# Patient Record
Sex: Male | Born: 1960 | Race: White | Hispanic: No | Smoking: Never smoker
Health system: Southern US, Community
[De-identification: ages and names within clinical notes are randomized; demographics above are authoritative.]

## PROBLEM LIST (undated history)

## (undated) ENCOUNTER — Emergency Department (HOSPITAL_COMMUNITY): Admission: EM | Payer: Self-pay | Source: Home / Self Care

## (undated) DIAGNOSIS — H269 Unspecified cataract: Secondary | ICD-10-CM

## (undated) DIAGNOSIS — E1140 Type 2 diabetes mellitus with diabetic neuropathy, unspecified: Secondary | ICD-10-CM

## (undated) DIAGNOSIS — F419 Anxiety disorder, unspecified: Secondary | ICD-10-CM

## (undated) DIAGNOSIS — F32A Depression, unspecified: Secondary | ICD-10-CM

## (undated) DIAGNOSIS — M1990 Unspecified osteoarthritis, unspecified site: Secondary | ICD-10-CM

## (undated) DIAGNOSIS — G8929 Other chronic pain: Secondary | ICD-10-CM

## (undated) DIAGNOSIS — K219 Gastro-esophageal reflux disease without esophagitis: Secondary | ICD-10-CM

## (undated) DIAGNOSIS — F329 Major depressive disorder, single episode, unspecified: Secondary | ICD-10-CM

## (undated) DIAGNOSIS — F1910 Other psychoactive substance abuse, uncomplicated: Secondary | ICD-10-CM

## (undated) DIAGNOSIS — E785 Hyperlipidemia, unspecified: Secondary | ICD-10-CM

## (undated) DIAGNOSIS — E119 Type 2 diabetes mellitus without complications: Secondary | ICD-10-CM

## (undated) DIAGNOSIS — G709 Myoneural disorder, unspecified: Secondary | ICD-10-CM

## (undated) DIAGNOSIS — F319 Bipolar disorder, unspecified: Secondary | ICD-10-CM

## (undated) DIAGNOSIS — B029 Zoster without complications: Secondary | ICD-10-CM

## (undated) DIAGNOSIS — G4730 Sleep apnea, unspecified: Secondary | ICD-10-CM

## (undated) DIAGNOSIS — I10 Essential (primary) hypertension: Secondary | ICD-10-CM

## (undated) DIAGNOSIS — E7800 Pure hypercholesterolemia, unspecified: Secondary | ICD-10-CM

## (undated) DIAGNOSIS — Z8679 Personal history of other diseases of the circulatory system: Secondary | ICD-10-CM

## (undated) DIAGNOSIS — Z5189 Encounter for other specified aftercare: Secondary | ICD-10-CM

## (undated) HISTORY — PX: COLONOSCOPY: SHX174

## (undated) HISTORY — DX: Bipolar disorder, unspecified: F31.9

## (undated) HISTORY — DX: Unspecified cataract: H26.9

## (undated) HISTORY — DX: Anxiety disorder, unspecified: F41.9

## (undated) HISTORY — DX: Gastro-esophageal reflux disease without esophagitis: K21.9

## (undated) HISTORY — DX: Depression, unspecified: F32.A

## (undated) HISTORY — DX: Unspecified osteoarthritis, unspecified site: M19.90

## (undated) HISTORY — DX: Encounter for other specified aftercare: Z51.89

## (undated) HISTORY — DX: Essential (primary) hypertension: I10

## (undated) HISTORY — DX: Zoster without complications: B02.9

## (undated) HISTORY — DX: Myoneural disorder, unspecified: G70.9

## (undated) HISTORY — DX: Personal history of other diseases of the circulatory system: Z86.79

## (undated) HISTORY — PX: TRANSTHORACIC ECHOCARDIOGRAM: SHX275

## (undated) HISTORY — DX: Major depressive disorder, single episode, unspecified: F32.9

## (undated) HISTORY — PX: POLYPECTOMY: SHX149

## (undated) HISTORY — DX: Other psychoactive substance abuse, uncomplicated: F19.10

## (undated) HISTORY — DX: Type 2 diabetes mellitus without complications: E11.9

## (undated) HISTORY — DX: Hyperlipidemia, unspecified: E78.5

---

## 1998-02-25 ENCOUNTER — Emergency Department (HOSPITAL_COMMUNITY): Admission: EM | Admit: 1998-02-25 | Discharge: 1998-02-25 | Payer: Self-pay | Admitting: Emergency Medicine

## 2003-11-03 ENCOUNTER — Emergency Department (HOSPITAL_COMMUNITY): Admission: EM | Admit: 2003-11-03 | Discharge: 2003-11-03 | Payer: Self-pay | Admitting: Emergency Medicine

## 2007-01-14 HISTORY — PX: SHOULDER ARTHROSCOPY WITH ROTATOR CUFF REPAIR: SHX5685

## 2007-08-05 ENCOUNTER — Encounter: Admission: RE | Admit: 2007-08-05 | Discharge: 2007-08-05 | Payer: Self-pay | Admitting: Orthopedic Surgery

## 2007-10-02 ENCOUNTER — Emergency Department (HOSPITAL_COMMUNITY): Admission: EM | Admit: 2007-10-02 | Discharge: 2007-10-03 | Payer: Self-pay | Admitting: Emergency Medicine

## 2008-01-14 HISTORY — PX: SPINE SURGERY: SHX786

## 2008-02-03 ENCOUNTER — Ambulatory Visit (HOSPITAL_COMMUNITY): Admission: RE | Admit: 2008-02-03 | Discharge: 2008-02-04 | Payer: Self-pay | Admitting: Orthopedic Surgery

## 2008-03-13 ENCOUNTER — Ambulatory Visit: Payer: Self-pay | Admitting: Gastroenterology

## 2008-04-11 ENCOUNTER — Encounter: Payer: Self-pay | Admitting: Internal Medicine

## 2008-05-11 ENCOUNTER — Ambulatory Visit: Payer: Self-pay | Admitting: Internal Medicine

## 2008-05-11 DIAGNOSIS — F329 Major depressive disorder, single episode, unspecified: Secondary | ICD-10-CM | POA: Insufficient documentation

## 2008-05-11 DIAGNOSIS — F32A Depression, unspecified: Secondary | ICD-10-CM | POA: Insufficient documentation

## 2008-05-11 DIAGNOSIS — E782 Mixed hyperlipidemia: Secondary | ICD-10-CM | POA: Insufficient documentation

## 2008-05-11 DIAGNOSIS — Z860100 Personal history of colon polyps, unspecified: Secondary | ICD-10-CM | POA: Insufficient documentation

## 2008-05-11 DIAGNOSIS — M545 Low back pain, unspecified: Secondary | ICD-10-CM | POA: Insufficient documentation

## 2008-05-11 DIAGNOSIS — E785 Hyperlipidemia, unspecified: Secondary | ICD-10-CM

## 2008-05-11 DIAGNOSIS — M1990 Unspecified osteoarthritis, unspecified site: Secondary | ICD-10-CM | POA: Insufficient documentation

## 2008-05-11 DIAGNOSIS — I10 Essential (primary) hypertension: Secondary | ICD-10-CM | POA: Insufficient documentation

## 2008-05-11 DIAGNOSIS — K219 Gastro-esophageal reflux disease without esophagitis: Secondary | ICD-10-CM | POA: Insufficient documentation

## 2008-05-11 LAB — CONVERTED CEMR LAB

## 2008-05-22 ENCOUNTER — Ambulatory Visit (HOSPITAL_COMMUNITY): Admission: RE | Admit: 2008-05-22 | Discharge: 2008-05-23 | Payer: Self-pay | Admitting: Orthopedic Surgery

## 2008-08-02 ENCOUNTER — Ambulatory Visit: Payer: Self-pay | Admitting: Internal Medicine

## 2008-08-16 ENCOUNTER — Ambulatory Visit: Payer: Self-pay | Admitting: Gastroenterology

## 2008-08-30 ENCOUNTER — Ambulatory Visit: Payer: Self-pay | Admitting: Gastroenterology

## 2008-12-05 ENCOUNTER — Ambulatory Visit: Payer: Self-pay | Admitting: Internal Medicine

## 2008-12-05 DIAGNOSIS — B029 Zoster without complications: Secondary | ICD-10-CM | POA: Insufficient documentation

## 2008-12-05 DIAGNOSIS — J019 Acute sinusitis, unspecified: Secondary | ICD-10-CM | POA: Insufficient documentation

## 2008-12-05 HISTORY — DX: Zoster without complications: B02.9

## 2010-01-24 ENCOUNTER — Ambulatory Visit: Admit: 2010-01-24 | Payer: Self-pay | Admitting: Internal Medicine

## 2010-02-14 NOTE — Assessment & Plan Note (Signed)
Summary: COLD/NWS   Vital Signs:  Patient profile:   50 year old male Height:      74 inches Weight:      246 pounds BMI:     31.70 O2 Sat:      99 % on Room air Temp:     97.7 degrees F oral Pulse rate:   85 / minute Pulse rhythm:   regular Resp:     16 per minute BP sitting:   134 / 86  (left arm) Cuff size:   large  Vitals Entered By: Rock Nephew CMA (December 05, 2008 8:15 AM)  Nutrition Counseling: Patient's BMI is greater than 25 and therefore counseled on weight management options.  O2 Flow:  Room air CC: cough, bodyache, congestion, URI symptoms Is Patient Diabetic? No   Primary Care Provider:  Etta Grandchild MD  CC:  cough, bodyache, congestion, and URI symptoms.  History of Present Illness:  URI Symptoms      This is a 50 year old man who presents with URI symptoms.  The symptoms began 5 days ago.  The severity is described as moderate.  The patient reports nasal congestion, purulent nasal discharge, sore throat, and productive cough, but denies earache and sick contacts.  The patient denies fever, stiff neck, dyspnea, wheezing, rash, vomiting, diarrhea, and use of an antipyretic.  The patient also reports headache, muscle aches, and severe fatigue.  Risk factors for Strep sinusitis include unilateral facial pain and unilateral nasal discharge.  The patient denies the following risk factors for Strep sinusitis: tender adenopathy.    Also, he woke up today with a painful rash in his right calf with some tingling.  Preventive Screening-Counseling & Management  Alcohol-Tobacco     Alcohol drinks/day: 0     Smoking Status: never  Current Medications (verified): 1)  Trazodone Hcl 50 Mg Tabs (Trazodone Hcl) .... Take 1 Tablet By Mouth Once A Day 2)  Gabapentin 300 Mg Caps (Gabapentin) .... One Tab Every 8 Hrs 3)  Benicar Hct 20-12.5 Mg Tabs (Olmesartan Medoxomil-Hctz) .... Once Daily 4)  Nexium 40 Mg Cpdr (Esomeprazole Magnesium) .... Once Daily 5)   Oxycodone-Acetaminophen 10-325 Mg Tabs (Oxycodone-Acetaminophen) .... One Every 6 Hours 6)  Lamictal 25 Mg Tabs (Lamotrigine) .... 25mg -50mg   Allergies (verified): 1)  ! Sulfa 2)  ! * Mycins 3)  ! Terramycin  Past History:  Past Medical History: Reviewed history from 05/11/2008 and no changes required. Colonic polyps, hx of Depression GERD Hyperlipidemia Hypertension Low back pain Osteoarthritis Normal cardiac cath 10 years ago  Past Surgical History: Reviewed history from 05/11/2008 and no changes required. Lumbar laminectomy in 2010  Family History: Reviewed history from 05/11/2008 and no changes required. Family History of Arthritis Family History of CAD Male 1st degree relative <50 Family History Diabetes 1st degree relative Family History Hypertension Family History of Sudden Death  Social History: Reviewed history from 05/11/2008 and no changes required. Occupation: Disabled trucker Divorced Never Smoked Alcohol use-no Drug use-no Regular exercise-yes  Review of Systems       The patient complains of suspicious skin lesions.  The patient denies anorexia, fever, abdominal pain, enlarged lymph nodes, and angioedema.    Physical Exam  General:  alert, well-developed, well-nourished, well-hydrated, appropriate dress, normal appearance, healthy-appearing, cooperative to examination, good hygiene, and overweight-appearing.   Head:  normocephalic, atraumatic, no abnormalities observed, and no abnormalities palpated.   Eyes:  vision grossly intact, pupils equal, pupils round, and pupils reactive to light.  Ears:  R ear normal and L ear normal.   Mouth:  no exudates, no posterior lymphoid hypertrophy, no postnasal drip, no pharyngeal crowing, no lesions, no aphthous ulcers, no erosions, no petechiae, and pharyngeal erythema.   Neck:  supple, full ROM, no masses, no thyromegaly, no thyroid nodules or tenderness, no JVD, no cervical lymphadenopathy, and no neck  tenderness.   Lungs:  normal respiratory effort, no intercostal retractions, no accessory muscle use, normal breath sounds, and no dullness.   Heart:  normal rate, regular rhythm, no murmur, no gallop, and no rub.   Abdomen:  soft, non-tender, normal bowel sounds, no distention, no masses, no hepatomegaly, and no splenomegaly.   Msk:  normal ROM, no joint tenderness, no joint swelling, and no joint warmth.   Pulses:  R and L carotid,radial,femoral,dorsalis pedis and posterior tibial pulses are full and equal bilaterally Extremities:  No clubbing, cyanosis, edema, or deformity noted with normal full range of motion of all joints.   Neurologic:  No cranial nerve deficits noted. Station and gait are normal. Plantar reflexes are down-going bilaterally. DTRs are symmetrical throughout. Sensory, motor and coordinative functions appear intact. Skin:  he has a group of coaleseced erythematous papules on his right posterior calf. there is no induration, fluctuance, streaking, pustules, or wounds Cervical Nodes:  no anterior cervical adenopathy and no posterior cervical adenopathy.   Axillary Nodes:  no R axillary adenopathy and no L axillary adenopathy.   Inguinal Nodes:  no R inguinal adenopathy and no L inguinal adenopathy.   Psych:  Oriented X3, memory intact for recent and remote, good eye contact, not depressed appearing, not agitated, and slightly anxious.     Impression & Recommendations:  Problem # 1:  HERPES ZOSTER (ICD-053.9) Assessment New start Acyclovir  Problem # 2:  SINUSITIS- ACUTE-NOS (ICD-461.9) Assessment: New  His updated medication list for this problem includes:    Amoxicillin 500 Mg Cap (Amoxicillin) .Marland Kitchen... Take 1 capsule by mouth three times a day x 10 days    Promethazine-dm 6.25-15 Mg/43ml Syrp (Promethazine-dm) .Marland Kitchen... 5-10 ml by mouth qid as needed for cough  Instructed on treatment. Call if symptoms persist or worsen.   Complete Medication List: 1)  Trazodone Hcl 50 Mg  Tabs (Trazodone hcl) .... Take 1 tablet by mouth once a day 2)  Gabapentin 300 Mg Caps (Gabapentin) .... One tab every 8 hrs 3)  Benicar Hct 20-12.5 Mg Tabs (Olmesartan medoxomil-hctz) .... Once daily 4)  Nexium 40 Mg Cpdr (Esomeprazole magnesium) .... Once daily 5)  Oxycodone-acetaminophen 10-325 Mg Tabs (Oxycodone-acetaminophen) .... One every 6 hours 6)  Lamictal 25 Mg Tabs (Lamotrigine) .... 25mg -50mg  7)  Amoxicillin 500 Mg Cap (Amoxicillin) .... Take 1 capsule by mouth three times a day x 10 days 8)  Acyclovir 800 Mg Tabs (Acyclovir) .... One by mouth three times a day for 7 days 9)  Promethazine-dm 6.25-15 Mg/2ml Syrp (Promethazine-dm) .... 5-10 ml by mouth qid as needed for cough  Patient Instructions: 1)  It is important that you exercise regularly at least 20 minutes 5 times a week. If you develop chest pain, have severe difficulty breathing, or feel very tired , stop exercising immediately and seek medical attention. 2)  You need to lose weight. Consider a lower calorie diet and regular exercise.  3)  Take your antibiotic as prescribed until ALL of it is gone, but stop if you develop a rash or swelling and contact our office as soon as possible. 4)  Acute bronchitis symptoms  for less than 10 days are not helped by antibiotics. take over the counter cough medications. call if no improvment in  5-7 days, sooner if increasing cough, fever, or new symptoms( shortness of breath, chest pain). Prescriptions: AMOXICILLIN 500 MG CAP (AMOXICILLIN) Take 1 capsule by mouth three times a day X 10 days  #30 x 0   Entered and Authorized by:   Etta Grandchild MD   Signed by:   Etta Grandchild MD on 12/05/2008   Method used:   [facility] to        [id number]       [address]       [place], [place]  [address]       Ph: [phone] or [phone]       Fax: [phone]   RxID:   [id number] PROMETHAZINE-DM 6.25-15 MG/5ML SYRP (PROMETHAZINE-DM)  5-10 ml by mouth QID as needed for cough  #6 ounces x 0   Entered and Authorized by:   Etta Grandchild MD   Signed by:   Etta Grandchild MD on 12/05/2008   Method used:   [facility] to        [id number]       [address]       [place], [place]  [address]       Ph: [phone] or [phone]       Fax: [phone]   RxID:   [id number] ACYCLOVIR 800 MG TABS (ACYCLOVIR) One by mouth three times a day for 7 days  #21 x 1   Entered and Authorized by:   Etta Grandchild MD   Signed by:   Etta Grandchild MD on 12/05/2008   Method used:   Historical   RxID:   [id number] AMOXICILLIN 500 MG CAP (AMOXICILLIN) Take 1 capsule by mouth three times a day X 10 days  #30 x 0   Entered and Authorized by:   Etta Grandchild MD   Signed by:   Etta Grandchild MD on 12/05/2008   Method used:   Print then Give to Patient   RxID:   [id number]

## 2010-02-14 NOTE — Miscellaneous (Signed)
Summary: [facility] Previsit/prep  Clinical Lists Changes  Medications: Added new medication of DULCOLAX 5 MG  TBEC (BISACODYL) Day before procedure take 2 at 3pm and 2 at 8pm. - Signed Added new medication of METOCLOPRAMIDE HCL 10 MG  TABS (METOCLOPRAMIDE HCL) As per prep instructions. - Signed Added new medication of MIRALAX   POWD (POLYETHYLENE GLYCOL 3350) As per prep  instructions. - Signed Rx of DULCOLAX 5 MG  TBEC (BISACODYL) Day before procedure take 2 at 3pm and 2 at 8pm.;  #4 x 0;  Signed;  Entered by: Wyona Almas RN;  Authorized by: Louis Meckel MD;  Method used: [id number] [address] [place], [place]  [address], Ph: [phone] or [phone], Fax: [phone] Rx of METOCLOPRAMIDE HCL 10 MG  TABS (METOCLOPRAMIDE HCL) As per prep instructions.;  #2 x 0;  Signed;  Entered by: Wyona Almas RN;  Authorized by: Louis Meckel MD;  Method used: [id number] [address] [place], [place]  [address], Ph: [phone] or [phone], Fax: [phone] Rx of MIRALAX   POWD (POLYETHYLENE GLYCOL 3350) As per prep  instructions.;  #255gm x 0;  Signed;  Entered by: Wyona Almas RN;  Authorized by: Louis Meckel MD;  Method used: [facility]ically [id number] [address], [place], [place]  [address], Ph: [phone] or [phone], Fax: [phone] Allergies: Added new allergy or adverse reaction of SULFA Added new allergy or adverse reaction of * MYCINS Observations: Added new observation of NKA: F (03/13/2008 8:21)    Prescriptions: MIRALAX   POWD (POLYETHYLENE GLYCOL 3350) As per prep  instructions.  #255gm x 0   Entered by:   Wyona Almas RN   Authorized by:   Louis Meckel MD   Signed by:   Wyona Almas RN on 03/13/2008   Method used:   [facility]ronically to        [id number]       [address]       [place], [place]  [address]       Ph: [phone] or [phone]       Fax: [phone]   RxID:   [id number] METOCLOPRAMIDE HCL 10 MG  TABS (METOCLOPRAMIDE HCL) As per prep instructions.  #2 x 0   Entered by:   Wyona Almas RN   Authorized by:   Louis Meckel MD   Signed by:   Wyona Almas RN on 03/13/2008   Method used:   [facility]ically to        [id number]       [address]       [place], [place]  [address]       Ph: [phone] or [phone]       Fax: [phone]   RxID:   [id number] DULCOLAX 5 MG  TBEC (BISACODYL) Day before procedure take 2 at 3pm and 2 at 8pm.  #4 x 0   Entered by:   Wyona Almas RN   Authorized by:   Louis Meckel MD   Signed by:   Wyona Almas RN on 03/13/2008   Method used:   [facility]ically to        [id number]       [address]  [place], [place]  [address]       Ph: [phone] or [phone]       Fax: [phone]   RxID:   [id number]

## 2010-02-14 NOTE — Miscellaneous (Signed)
Summary: Miralax prep - pt has medications from previous previsit  Clinical Lists Changes

## 2010-02-14 NOTE — Assessment & Plan Note (Signed)
Summary: new to establish/united hc/$50/cd   Vital Signs:  Patient profile:   50 year old male Height:      74 inches Weight:      246 pounds BMI:     31.70 O2 Sat:      97 % Temp:     97.9 degrees F oral Pulse rate:   86 / minute Pulse rhythm:   regular BP sitting:   140 / 90  (right arm) Cuff size:   large  Vitals Entered By: Rock Nephew CMA (May 11, 2008 2:49 PM)  Nutrition Counseling: Patient's BMI is greater than 25 and therefore counseled on weight management options.  Primary Care Provider:  Etta Grandchild MD   History of Present Illness: This is a new pt. to me who needs a new PCP. He has no complaints today.  Dyspepsia History:      He has no alarm features of dyspepsia including no history of melena, hematochezia, dysphagia, persistent vomiting, or involuntary weight loss > 5%.  There is a prior history of GERD.  He notes that it has been less than 12 months since the last episode of GERD.  The patient does not have a prior history of documented ulcer disease.  The dominant symptom is heartburn or acid reflux.  An H-2 blocker medication is currently being taken.  He notes that the symptoms have improved with the H-2 blocker therapy.  Symptoms have not persisted after 4 weeks of H-2 blocker treatment.  He has no history of a positive H. Pylori serology.  No previous upper endoscopy has been done.     Preventive Screening-Counseling & Management     Alcohol drinks/day: 0     Smoking Status: never     Does Patient Exercise: yes     Hepatitis Risk: no risk noted     HIV Risk: no risk noted     STD Risk: no risk noted      Drug Use:  no.        Blood Transfusions:  no.    Current Medications (verified): 1)  Trazodone Hcl 50 Mg Tabs (Trazodone Hcl) .... Take 1 Tablet By Mouth Once A Day 2)  Gabapentin 300 Mg Caps (Gabapentin) .... One Tab Every 8 Hrs 3)  Ultram Er 300 Mg Xr24h-Tab (Tramadol Hcl) .... Take 1 Tablet By Mouth Once A Day 4)  Benicar Hct 20-12.5 Mg  Tabs (Olmesartan Medoxomil-Hctz) .... Once Daily 5)  Nexium 40 Mg Cpdr (Esomeprazole Magnesium) .... Once Daily  Allergies: 1)  ! Sulfa 2)  ! * Mycins 3)  ! Terramycin  Past History:  Past Medical History:    Colonic polyps, hx of    Depression    GERD    Hyperlipidemia    Hypertension    Low back pain    Osteoarthritis    Normal cardiac cath 10 years ago  Past Surgical History:    Lumbar laminectomy in 2010  Family History:    Family History of Arthritis    Family History of CAD Male 1st degree relative <50    Family History Diabetes 1st degree relative    Family History Hypertension    Family History of Sudden Death  Social History:    Occupation: Disabled trucker    Divorced    Never Smoked    Alcohol use-no    Drug use-no    Regular exercise-yes    Smoking Status:  never    Hepatitis Risk:  no risk noted  HIV Risk:  no risk noted    STD Risk:  no risk noted    Blood Transfusions:  no    Drug Use:  no    Does Patient Exercise:  yes  Review of Systems       The patient complains of weight gain.  The patient denies anorexia, fever, weight loss, chest pain, syncope, dyspnea on exertion, peripheral edema, prolonged cough, headaches, hemoptysis, abdominal pain, melena, hematochezia, severe indigestion/heartburn, hematuria, incontinence, difficulty walking, depression, enlarged lymph nodes, angioedema, and testicular masses.    Physical Exam  General:  alert, well-developed, well-nourished, well-hydrated, appropriate dress, and overweight-appearing.   Eyes:  No corneal or conjunctival inflammation noted. EOMI. Perrla. Funduscopic exam benign, without hemorrhages, exudates or papilledema. Vision grossly normal. Mouth:  Oral mucosa and oropharynx without lesions or exudates.  Teeth in good repair. Neck:  supple, full ROM, and no masses.   Lungs:  Normal respiratory effort, chest expands symmetrically. Lungs are clear to auscultation, no crackles or wheezes. Heart:   Normal rate and regular rhythm. S1 and S2 normal without gallop, murmur, click, rub or other extra sounds. Abdomen:  soft, non-tender, normal bowel sounds, no distention, no masses, no guarding, no hepatomegaly, and no splenomegaly.   Msk:  normal ROM, no joint tenderness, no joint swelling, no joint warmth, no redness over joints, and no crepitation.   Pulses:  R and L carotid,radial,femoral,dorsalis pedis and posterior tibial pulses are full and equal bilaterally Extremities:  No clubbing, cyanosis, edema, or deformity noted with normal full range of motion of all joints.   Neurologic:  No cranial nerve deficits noted. Station and gait are normal. Plantar reflexes are down-going bilaterally. DTRs are symmetrical throughout. Sensory, motor and coordinative functions appear intact. Skin:  turgor normal, color normal, no rashes, no suspicious lesions, no ecchymoses, no petechiae, no purpura, no ulcerations, and no edema.   Cervical Nodes:  No lymphadenopathy noted Psych:  Cognition and judgment appear intact. Alert and cooperative with normal attention span and concentration. No apparent delusions, illusions, hallucinations   Impression & Recommendations:  Problem # 1:  HYPERTENSION (ICD-401.9) Assessment Unchanged  His updated medication list for this problem includes:    Benicar Hct 20-12.5 Mg Tabs (Olmesartan medoxomil-hctz) ..... Once daily  Problem # 2:  HYPERLIPIDEMIA (ICD-272.4) Assessment: Unchanged  Problem # 3:  GERD (ICD-530.81) Assessment: Unchanged  His updated medication list for this problem includes:    Nexium 40 Mg Cpdr (Esomeprazole magnesium) ..... Once daily  Complete Medication List: 1)  Trazodone Hcl 50 Mg Tabs (Trazodone hcl) .... Take 1 tablet by mouth once a day 2)  Gabapentin 300 Mg Caps (Gabapentin) .... One tab every 8 hrs 3)  Ultram Er 300 Mg Xr24h-tab (Tramadol hcl) .... Take 1 tablet by mouth once a day 4)  Benicar Hct 20-12.5 Mg Tabs (Olmesartan  medoxomil-hctz) .... Once daily 5)  Nexium 40 Mg Cpdr (Esomeprazole magnesium) .... Once daily  Colorectal Screening:  Colonoscopy Results:    Date of Exam: 05/31/2003    Results: Adenomatous Polyp  PSA Screening:    Reviewed PSA screening recommendations: Pro's and Cons's of PSA discussed and patient chooses to defer  Patient Instructions: 1)  Please schedule a follow-up appointment in 2 months. 2)  Avoid foods high in acid (tomatoes, citrus juices, spicy foods). Avoid eating within two hours of lying down or before exercising. Do not over eat; try smaller more frequent meals. Elevate head of bed twelve inches when sleeping. 3)  It is important  that you exercise regularly at least 20 minutes 5 times a week. If you develop chest pain, have severe difficulty breathing, or feel very tired , stop exercising immediately and seek medical attention. 4)  You need to lose weight. Consider a lower calorie diet and regular exercise.  5)  Check your Blood Pressure regularly. If it is above 140/90: you should make an appointment. Prescriptions: NEXIUM 40 MG CPDR (ESOMEPRAZOLE MAGNESIUM) once daily  #55 x 0   Entered and Authorized by:   Etta Grandchild MD   Signed by:   Etta Grandchild MD on 05/11/2008   Method used:   Historical   RxID:   [id number] BENICAR HCT 20-12.5 MG TABS (OLMESARTAN MEDOXOMIL-HCTZ) once daily  #140 x 0   Entered and Authorized by:   Etta Grandchild MD   Signed by:   Etta Grandchild MD on 05/11/2008   Method used:   Historical   RxID:   [id number]

## 2010-02-26 ENCOUNTER — Ambulatory Visit: Payer: Self-pay | Admitting: Internal Medicine

## 2010-02-27 ENCOUNTER — Ambulatory Visit: Payer: Self-pay | Admitting: Internal Medicine

## 2010-03-22 ENCOUNTER — Telehealth: Payer: Self-pay | Admitting: Internal Medicine

## 2010-03-26 NOTE — Progress Notes (Signed)
  Phone Note Refill Request Message from:  Fax from Pharmacy on March 22, 2010 9:32 AM  Refills Requested: Medication #1:  BENICAR HCT 20-12.5 MG TABS once daily Initial call taken by: Ami Bullins CMA,  March 22, 2010 9:32 AM    Prescriptions: BENICAR HCT 20-12.5 MG TABS (OLMESARTAN MEDOXOMIL-HCTZ) once daily  #30 x 4   Entered by:   Ami Bullins CMA   Authorized by:   Etta Grandchild MD   Signed by:   Bill Salinas CMA on 03/22/2010   Method used:   [facility] to        [facility] [id number]       [address]       [place], [place]  [address]       Ph: [phone] or [phone]       Fax: [phone]   RxID:   [id number]

## 2010-04-23 LAB — URINALYSIS, ROUTINE W REFLEX MICROSCOPIC
Bilirubin Urine: NEGATIVE
Glucose, UA: NEGATIVE mg/dL
Hgb urine dipstick: NEGATIVE
Ketones, ur: NEGATIVE mg/dL
Nitrite: NEGATIVE
Protein, ur: NEGATIVE mg/dL
Specific Gravity, Urine: 1.015 (ref 1.005–1.030)
Urobilinogen, UA: 0.2 mg/dL (ref 0.0–1.0)
pH: 5.5 (ref 5.0–8.0)

## 2010-04-23 LAB — BASIC METABOLIC PANEL
BUN: 10 mg/dL (ref 6–23)
BUN: 8 mg/dL (ref 6–23)
CO2: 30 mEq/L (ref 19–32)
CO2: 32 mEq/L (ref 19–32)
Calcium: 8.6 mg/dL (ref 8.4–10.5)
Calcium: 9.5 mg/dL (ref 8.4–10.5)
Chloride: 104 mEq/L (ref 96–112)
Chloride: 99 mEq/L (ref 96–112)
Creatinine, Ser: 0.98 mg/dL (ref 0.4–1.5)
Creatinine, Ser: 1.06 mg/dL (ref 0.4–1.5)
GFR calc Af Amer: >60 mL/min (ref >60)
GFR calc Af Amer: >60 mL/min (ref >60)
GFR calc non Af Amer: >60 mL/min (ref >60)
GFR calc non Af Amer: >60 mL/min (ref >60)
Glucose, Bld: 140 mg/dL — ABNORMAL HIGH (ref 70–99)
Glucose, Bld: 151 mg/dL — ABNORMAL HIGH (ref 70–99)
Potassium: 3.4 mEq/L — ABNORMAL LOW (ref 3.5–5.1)
Potassium: 4.1 mEq/L (ref 3.5–5.1)
Sodium: 138 mEq/L (ref 135–145)
Sodium: 139 mEq/L (ref 135–145)

## 2010-04-23 LAB — CBC
HCT: 36.3 % — ABNORMAL LOW (ref 39.0–52.0)
HCT: 40.4 % (ref 39.0–52.0)
Hemoglobin: 12.9 g/dL — ABNORMAL LOW (ref 13.0–17.0)
Hemoglobin: 14.3 g/dL (ref 13.0–17.0)
MCHC: 35.3 g/dL (ref 30.0–36.0)
MCHC: 35.4 g/dL (ref 30.0–36.0)
MCV: 82.9 fL (ref 78.0–100.0)
MCV: 83.3 fL (ref 78.0–100.0)
Platelets: 158 10*3/uL (ref 150–400)
Platelets: 194 10*3/uL (ref 150–400)
RBC: 4.38 MIL/uL (ref 4.22–5.81)
RBC: 4.85 MIL/uL (ref 4.22–5.81)
RDW: 14 % (ref 11.5–15.5)
RDW: 14.1 % (ref 11.5–15.5)
WBC: 5.8 10*3/uL (ref 4.0–10.5)
WBC: 5.8 10*3/uL (ref 4.0–10.5)

## 2010-04-23 LAB — DIFFERENTIAL
Basophils Absolute: 0 10*3/uL (ref 0.0–0.1)
Basophils Relative: 1 % (ref 0–1)
Eosinophils Absolute: 0.2 10*3/uL (ref 0.0–0.7)
Eosinophils Relative: 4 % (ref 0–5)
Lymphocytes Relative: 28 % (ref 12–46)
Lymphs Abs: 1.6 10*3/uL (ref 0.7–4.0)
Monocytes Absolute: 0.3 10*3/uL (ref 0.1–1.0)
Monocytes Relative: 6 % (ref 3–12)
Neutro Abs: 3.5 10*3/uL (ref 1.7–7.7)
Neutrophils Relative %: 61 % (ref 43–77)

## 2010-04-23 LAB — PROTIME-INR
INR: 1 (ref 0.00–1.49)
Prothrombin Time: 13.1 seconds (ref 11.6–15.2)

## 2010-04-23 LAB — APTT: aPTT: 31 seconds (ref 24–37)

## 2010-04-29 LAB — BASIC METABOLIC PANEL
BUN: 13 mg/dL (ref 6–23)
CO2: 29 mEq/L (ref 19–32)
Calcium: 9.7 mg/dL (ref 8.4–10.5)
Chloride: 102 mEq/L (ref 96–112)
Creatinine, Ser: 1.05 mg/dL (ref 0.4–1.5)
GFR calc Af Amer: >60 mL/min (ref >60)
GFR calc non Af Amer: >60 mL/min (ref >60)
Glucose, Bld: 122 mg/dL — ABNORMAL HIGH (ref 70–99)
Potassium: 4.3 mEq/L (ref 3.5–5.1)
Sodium: 140 mEq/L (ref 135–145)

## 2010-04-29 LAB — CBC
HCT: 42.7 % (ref 39.0–52.0)
Hemoglobin: 14.9 g/dL (ref 13.0–17.0)
MCHC: 34.8 g/dL (ref 30.0–36.0)
MCV: 82.7 fL (ref 78.0–100.0)
Platelets: 211 10*3/uL (ref 150–400)
RBC: 5.16 MIL/uL (ref 4.22–5.81)
RDW: 13.5 % (ref 11.5–15.5)
WBC: 6.9 10*3/uL (ref 4.0–10.5)

## 2010-05-28 NOTE — Op Note (Signed)
NAME:  [name] [name], [name] [name] NO.:  [id number]   MEDICAL RECORD NO.:  [id number]          PATIENT TYPE:  OIB   LOCATION:  5023                         FACILITY:  [facility]   PHYSICIAN:  Almedia Balls. Ranell Patrick, M.D. DATE OF BIRTH:  May 19, 1960   DATE OF PROCEDURE:  05/22/2008  DATE OF DISCHARGE:                               OPERATIVE REPORT   PREOPERATIVE DIAGNOSIS:  Right shoulder superior labrum anterior and  posterior lesion and osteoarthritis.   POSTOPERATIVE DIAGNOSIS:  Right shoulder superior labrum anterior and  posterior lesion and osteoarthritis.   PROCEDURE PERFORMED:  1. Right shoulder arthroscopy with extensive intraarticular      debridement of torn superior labrum, anterior and posterior.  2. Arthroscopic biceps tenotomy with arthroscopic subacromial      decompression with open biceps tenodesis in the groove using      Panalok anchor and open DCR.   ATTENDING SURGEON:  Almedia Balls. Ranell Patrick, MD   ASSISTANT:  Donnie Coffin. Dixon, PA-C   ANESTHESIA:  General anesthesia plus interscalene block anesthesia was  used.   ESTIMATED BLOOD LOSS:  Minimal.   FLUID REPLACEMENT:  1200 mL crystalloid.   INSTRUMENT COUNT:  Correct.   COMPLICATIONS:  None.   Preoperative antibiotics were given.   INDICATIONS:  The patient is a 50 year old male with worsening shoulder  pain secondary to torn superior labrum anterior to posterior.  The  patient also had symptomatic AC arthritis.  The patient presents after  failed conservative management desiring operative treatment.  Informed  consent was obtained.   DESCRIPTION OF PROCEDURE:  After an adequate level of anesthesia was  achieved, the patient was positioned in the modified beachchair  position.  The right shoulder was examined under anesthesia.  The  patient had a little bit of end-range stiffness but nothing that  restricted from basic function, range of motion, forward elevation about  150 degrees of abduction, 90 degrees of  external rotation, 45 internal  rotation to the abdomen easily after there was some instability noted.  After sterilely prepped and draped the right shoulder, we entered the  shoulder using standard arthroscopic portals including anterior,  posterior, and lateral portals.  We identified the torn superior labrum,  anterior to posterior with an unstable biceps anchor.  We performed a  labral debridement, a biceps tenotomy with basket forceps and a  motorized shaver.  The patient's subscapularis was normal.  The  patient's rotator cuff was normal.  The articular cartilage on the  humeral head and the glenoid was normal.  The remainder of the shoulder  appeared basically normal.  At this point, we placed the scope in the  subacromial space and performed a thorough bursectomy and acromioplasty  creating a type 1 acromial shape and releasing the CA ligament.  A  complete decompression of rotator cuff outlet was performed.  Following  the subacromial decompression and inspection of the rotator cuff, we  went ahead and made a saber incision overlying the AC joint.  Dissection  was carried sharply down to the subcutaneous tissues.  The  deltotrapezial fascia was identified and split in line with the  distal  clavicle.  Subperiosteal dissection of the distal clavicle was performed  followed by excision of distal 3 mm using an oscillating saw.  Bone wax  was applied to the distal clavicle followed by thorough irrigation of  the AC interval and closed the deltotrapezial fascia using interrupted 0  Vicryl figure-of-eight suture followed by 2-0 Vicryl subcutaneous  closure with 4-0 running Monocryl for skin.  A small incision was  created over the bicipital groove using 10 blade scalpel.  Dissection  was carried sharply down to the subcutaneous tissues.  Deltoid was split  atraumatically within its fibers.  Biceps tendon identified.  Soft  tissue overlying the tendon incised using a Bovie.  The tendon  delivered  into wound, whipstitched using #2 FiberWire suture over the tenodesis  site.  We then placed a single 2.5 Panalok anchor into the floor of the  biceps groove bringing the sutures up through the reinforced area of the  tendon and then tying the tendon down into the groove.  We removed the  remaining of the tendon, oversewed the soft tissue over the top of the  groove using 0 Vicryl suture followed by 0 Vicryl for the deltoid  closure, simple side-to-side stitch, followed by 2-0 Vicryl for the  subcutaneous closure and 4-0 Monocryl for the skin.  Steri-Strips  applied followed by sterile dressing.  The patient tolerated the surgery  well.      Almedia Balls. Ranell Patrick, M.D.  Electronically Signed     SRN/MEDQ  D:  05/22/2008  T:  05/23/2008  Job:  [id number]
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
## 2012-06-19 DIAGNOSIS — F411 Generalized anxiety disorder: Secondary | ICD-10-CM | POA: Insufficient documentation

## 2013-08-01 ENCOUNTER — Encounter: Payer: Self-pay | Admitting: Internal Medicine

## 2013-08-01 ENCOUNTER — Ambulatory Visit: Payer: Self-pay | Attending: Internal Medicine | Admitting: Internal Medicine

## 2013-08-01 VITALS — BP 143/82 | HR 56 | Temp 98.9°F | Resp 14 | Ht 73.0 in | Wt 240.2 lb

## 2013-08-01 DIAGNOSIS — G629 Polyneuropathy, unspecified: Secondary | ICD-10-CM

## 2013-08-01 DIAGNOSIS — E1149 Type 2 diabetes mellitus with other diabetic neurological complication: Secondary | ICD-10-CM | POA: Insufficient documentation

## 2013-08-01 DIAGNOSIS — N508 Other specified disorders of male genital organs: Secondary | ICD-10-CM

## 2013-08-01 DIAGNOSIS — F5232 Male orgasmic disorder: Secondary | ICD-10-CM

## 2013-08-01 DIAGNOSIS — N529 Male erectile dysfunction, unspecified: Secondary | ICD-10-CM

## 2013-08-01 DIAGNOSIS — F528 Other sexual dysfunction not due to a substance or known physiological condition: Secondary | ICD-10-CM

## 2013-08-01 DIAGNOSIS — G589 Mononeuropathy, unspecified: Secondary | ICD-10-CM

## 2013-08-01 DIAGNOSIS — E1142 Type 2 diabetes mellitus with diabetic polyneuropathy: Secondary | ICD-10-CM | POA: Insufficient documentation

## 2013-08-01 DIAGNOSIS — Z882 Allergy status to sulfonamides status: Secondary | ICD-10-CM | POA: Insufficient documentation

## 2013-08-01 DIAGNOSIS — Z881 Allergy status to other antibiotic agents status: Secondary | ICD-10-CM | POA: Insufficient documentation

## 2013-08-01 LAB — GLUCOSE, POCT (MANUAL RESULT ENTRY): POC Glucose: 182 mg/dl — AB (ref 70–99)

## 2013-08-01 LAB — POCT GLYCOSYLATED HEMOGLOBIN (HGB A1C): Hemoglobin A1C: 6.6

## 2013-08-01 MED ORDER — METFORMIN HCL 500 MG PO TABS: 500.0000 mg | ORAL_TABLET | Freq: Two times a day (BID) | ORAL | Status: DC

## 2013-08-01 MED ORDER — GABAPENTIN 600 MG PO TABS: 600.0000 mg | ORAL_TABLET | Freq: Three times a day (TID) | ORAL | Status: DC

## 2013-08-01 MED ORDER — LISINOPRIL-HYDROCHLOROTHIAZIDE 20-12.5 MG PO TABS: 1.0000 | ORAL_TABLET | Freq: Every day | ORAL | Status: DC

## 2013-08-01 NOTE — Patient Instructions (Signed)
DASH Eating Plan  DASH stands for "Dietary Approaches to Stop Hypertension." The DASH eating plan is a healthy eating plan that has been shown to reduce high blood pressure (hypertension). Additional health benefits may include reducing the risk of type 2 diabetes mellitus, heart disease, and stroke. The DASH eating plan may also help with weight loss.  WHAT DO I NEED TO KNOW ABOUT THE DASH EATING PLAN?  For the DASH eating plan, you will follow these general guidelines:  · Choose foods with a percent daily value for sodium of less than 5% (as listed on the food label).  · Use salt-free seasonings or herbs instead of table salt or sea salt.  · Check with your health care provider or pharmacist before using salt substitutes.  · Eat lower-sodium products, often labeled as "lower sodium" or "no salt added."  · Eat fresh foods.  · Eat more vegetables, fruits, and low-fat dairy products.  · Choose whole grains. Look for the word "whole" as the first word in the ingredient list.  · Choose fish and skinless chicken or turkey more often than red meat. Limit fish, poultry, and meat to 6 oz (170 g) each day.  · Limit sweets, desserts, sugars, and sugary drinks.  · Choose heart-healthy fats.  · Limit cheese to 1 oz (28 g) per day.  · Eat more home-cooked food and less restaurant, buffet, and fast food.  · Limit fried foods.  · Cook foods using methods other than frying.  · Limit canned vegetables. If you do use them, rinse them well to decrease the sodium.  · When eating at a restaurant, ask that your food be prepared with less salt, or no salt if possible.  WHAT FOODS CAN I EAT?  Seek help from a dietitian for individual calorie needs.  Grains  Whole grain or whole wheat bread. Brown rice. Whole grain or whole wheat pasta. Quinoa, bulgur, and whole grain cereals. Low-sodium cereals. Corn or whole wheat flour tortillas. Whole grain cornbread. Whole grain crackers. Low-sodium crackers.  Vegetables  Fresh or frozen vegetables  (raw, steamed, roasted, or grilled). Low-sodium or reduced-sodium tomato and vegetable juices. Low-sodium or reduced-sodium tomato sauce and paste. Low-sodium or reduced-sodium canned vegetables.   Fruits  All fresh, canned (in natural juice), or frozen fruits.  Meat and Other Protein Products  Ground beef (85% or leaner), grass-fed beef, or beef trimmed of fat. Skinless chicken or turkey. Ground chicken or turkey. Pork trimmed of fat. All fish and seafood. Eggs. Dried beans, peas, or lentils. Unsalted nuts and seeds. Unsalted canned beans.  Dairy  Low-fat dairy products, such as skim or 1% milk, 2% or reduced-fat cheeses, low-fat ricotta or cottage cheese, or plain low-fat yogurt. Low-sodium or reduced-sodium cheeses.  Fats and Oils  Tub margarines without trans fats. Light or reduced-fat mayonnaise and salad dressings (reduced sodium). Avocado. Safflower, olive, or canola oils. Natural peanut or almond butter.  Other  Unsalted popcorn and pretzels.  The items listed above may not be a complete list of recommended foods or beverages. Contact your dietitian for more options.  WHAT FOODS ARE NOT RECOMMENDED?  Grains  White bread. White pasta. White rice. Refined cornbread. Bagels and croissants. Crackers that contain trans fat.  Vegetables  Creamed or fried vegetables. Vegetables in a cheese sauce. Regular canned vegetables. Regular canned tomato sauce and paste. Regular tomato and vegetable juices.  Fruits  Dried fruits. Canned fruit in light or heavy syrup. Fruit juice.  Meat and Other Protein   Products  Fatty cuts of meat. Ribs, chicken wings, bacon, sausage, bologna, salami, chitterlings, fatback, hot dogs, bratwurst, and packaged luncheon meats. Salted nuts and seeds. Canned beans with salt.  Dairy  Whole or 2% milk, cream, half-and-half, and cream cheese. Whole-fat or sweetened yogurt. Full-fat cheeses or blue cheese. Nondairy creamers and whipped toppings. Processed cheese, cheese spreads, or cheese  curds.  Condiments  Onion and garlic salt, seasoned salt, table salt, and sea salt. Canned and packaged gravies. Worcestershire sauce. Tartar sauce. Barbecue sauce. Teriyaki sauce. Soy sauce, including reduced sodium. Steak sauce. Fish sauce. Oyster sauce. Cocktail sauce. Horseradish. Ketchup and mustard. Meat flavorings and tenderizers. [name] cubes. Hot sauce. Tabasco sauce. Marinades. Taco seasonings. Relishes.  Fats and Oils  Butter, stick margarine, lard, shortening, ghee, and bacon fat. Coconut, palm kernel, or palm oils. Regular salad dressings.  Other  Pickles and olives. Salted popcorn and pretzels.  The items listed above may not be a complete list of foods and beverages to avoid. Contact your dietitian for more information.  WHERE CAN I FIND MORE INFORMATION?  National Heart, Lung, and [employer] Institute: www.nhlbi.nih.gov/health/health-topics/topics/dash/  Document Released: 12/19/2010 Document Revised: 01/04/2013 Document Reviewed: 11/03/2012  ExitCare® Patient Information ©2015 [employer]. This information is not intended to replace advice given to you by your health care provider. Make sure you discuss any questions you have with your health care provider.

## 2013-08-01 NOTE — Progress Notes (Signed)
Patient ID: [name] [name], male   DOB: [birth year], 53 y.o.   MRN: [id number]  QPY:[id number]  [id number]:[id number]  DOB - [birth year]  CC:  Chief Complaint  Patient presents with  . Establish Care  . Diabetes       HPI: [name] [name] is a 53 y.o. male here today to establish medical care.  Patient reports that he was diagnosed with DM 8 months ago in [place].  Patient reports that he has suffered from Neuropathy since diagnosed and was on 900 mg of Gabapentin three times per day without relief.  He report that his doctor switched him to Lyrica which gave him significant relief.  Patient reports that he has been out of Metformin and BP medication.  Patient reports that he cannot get an erection and states that he has a weak ejaculation.  Patient reports that it takes him 10 minutes to start urine, but he has a strong urine stream once it starts.   Allergies  Allergen Reactions  . Oxytetracycline   . Sulfonamide Derivatives    Past Medical History  Diagnosis Date  . Depressed bipolar disorder   . Hypertension   . Diabetes mellitus without complication    No current outpatient prescriptions on file prior to visit.   No current facility-administered medications on file prior to visit.   Family History  Problem Relation Age of Onset  . Diabetes Mother   . Hyperlipidemia Mother   . Heart disease Father    History   Social History  . Marital Status: Single    Spouse Name: N/A    Number of Children: N/A  . Years of Education: N/A   Occupational History  . Not on file.   Social History Main Topics  . Smoking status: Never Smoker   . Smokeless tobacco: Not on file  . Alcohol Use: No  . Drug Use: No  . Sexual Activity: Not on file   Other Topics Concern  . Not on file   Social History Narrative  . No narrative on file    Review of Systems: Constitutional: Negative for fever, chills, diaphoresis, activity change, appetite change and fatigue. HENT: Negative for  ear pain, nosebleeds, congestion, facial swelling, rhinorrhea, neck pain, neck stiffness and ear discharge.  Eyes: Negative for pain, discharge, redness, itching and visual disturbance. Respiratory: Negative for cough, choking, chest tightness, shortness of breath, wheezing and stridor.  Cardiovascular: Negative for chest pain, palpitations and leg swelling. Gastrointestinal: Negative for abdominal distention. Musculoskeletal: Negative for back pain, joint swelling, arthralgia and gait problem. Neurological: Negative for dizziness, tremors, seizures, syncope, facial asymmetry, speech difficulty, weakness, light-headedness, numbness and headaches.  Hematological: Negative for adenopathy. Does not bruise/bleed easily. Psychiatric/Behavioral: Negative for hallucinations, behavioral problems, confusion, dysphoric mood, decreased concentration and agitation.    Objective:   Filed Vitals:   08/01/13 1157  BP: 143/82  Pulse: 56  Temp: 98.9 F (37.2 C)  Resp: 14   Physical Exam  Constitutional: He is oriented to person, place, and time. He appears well-nourished.  HENT:  Right Ear: External ear normal.  Left Ear: External ear normal.  Mouth/Throat: Oropharynx is clear and moist.  Eyes: Conjunctivae and EOM are normal. Pupils are equal, round, and reactive to light.  Neck: Normal range of motion. Neck supple.  Cardiovascular: Normal rate, regular rhythm, normal heart sounds and intact distal pulses.   Pulmonary/Chest: Effort normal and breath sounds normal.  Abdominal: Soft. Bowel sounds are normal.  Genitourinary: Rectum normal and prostate normal.  Musculoskeletal: Normal range of motion. He exhibits no edema and no tenderness.  Neurological: He is alert and oriented to person, place, and time. He has normal reflexes. No cranial nerve deficit.  Skin: Skin is warm and dry. No rash noted.     Lab Results  Component Value Date   WBC 5.8 05/23/2008   HGB 12.9* 05/23/2008   HCT 36.3*  05/23/2008   MCV 82.9 05/23/2008   PLT 158 05/23/2008   Lab Results  Component Value Date   CREATININE 0.98 05/23/2008   BUN 8 05/23/2008   NA 139 05/23/2008   K 3.4* 05/23/2008   CL 104 05/23/2008   CO2 30 05/23/2008    No results found for this basename: HGBA1C   Lipid Panel  No results found for this basename: chol, trig, hdl, cholhdl, vldl, ldlcalc       Assessment and plan:   [place] was seen today for establish care and diabetes.  Diagnoses and associated orders for this visit:  Type II or unspecified type diabetes mellitus with neurological manifestations, not stated as uncontrolled - Glucose (CBG) - HgB A1c - Amb Referral to Nutrition and Diabetic E - metFORMIN (GLUCOPHAGE) 500 MG tablet; Take 1 tablet (500 mg total) by mouth 2 (two) times daily with a meal. - lisinopril-hydrochlorothiazide (PRINZIDE,ZESTORETIC) 20-12.5 MG per tablet; Take 1 tablet by mouth daily. - CBC; Future - COMPLETE METABOLIC PANEL WITH GFR; Future Diet discussed. - Lipid panel; Future Will do foot exam on next visit Inability to maintain erection - PSA; Future - Testosterone, free; Future - TSH; Future - Ambulatory referral to Urology  Neuropathy - gabapentin ([name]) 600 MG tablet; Take 1 tablet (600 mg total) by mouth 3 (three) times daily.  Delayed ejaculation - Ambulatory referral to Urology   Return for thursday, Lab Visit and 3 mo PCP for DM/HTN.    Chari Manning, NP-C [facility] [phone] 08/02/2013, 7:34 PM

## 2013-08-01 NOTE — Progress Notes (Signed)
Patient presents to establish care for HTN and DM C/O neuropathic pain both feet; rates 4/10 at present. Walks every AM to alleviate pain Also C/O chronic low back pain from injury 2008; rates 2-3/10 at present Ran out of meds for HTN and DM 2 months ago

## 2013-08-02 ENCOUNTER — Encounter: Payer: Self-pay | Admitting: Internal Medicine

## 2013-08-04 ENCOUNTER — Other Ambulatory Visit: Payer: Self-pay

## 2013-08-05 ENCOUNTER — Ambulatory Visit: Payer: Self-pay | Attending: Internal Medicine

## 2013-08-05 DIAGNOSIS — N529 Male erectile dysfunction, unspecified: Secondary | ICD-10-CM

## 2013-08-05 DIAGNOSIS — E1149 Type 2 diabetes mellitus with other diabetic neurological complication: Secondary | ICD-10-CM

## 2013-08-05 LAB — CBC
HCT: 36.1 % — ABNORMAL LOW (ref 39.0–52.0)
Hemoglobin: 12.6 g/dL — ABNORMAL LOW (ref 13.0–17.0)
MCH: 28.6 pg (ref 26.0–34.0)
MCHC: 34.9 g/dL (ref 30.0–36.0)
MCV: 82 fL (ref 78.0–100.0)
Platelets: 158 10*3/uL (ref 150–400)
RBC: 4.4 MIL/uL (ref 4.22–5.81)
RDW: 14.7 % (ref 11.5–15.5)
WBC: 5.2 10*3/uL (ref 4.0–10.5)

## 2013-08-06 LAB — COMPLETE METABOLIC PANEL WITH GFR
ALT: 32 U/L (ref 0–53)
AST: 31 U/L (ref 0–37)
Albumin: 4.4 g/dL (ref 3.5–5.2)
Alkaline Phosphatase: 40 U/L (ref 39–117)
BUN: 17 mg/dL (ref 6–23)
CO2: 28 mEq/L (ref 19–32)
Calcium: 9.3 mg/dL (ref 8.4–10.5)
Chloride: 102 mEq/L (ref 96–112)
Creat: 0.87 mg/dL (ref 0.50–1.35)
GFR, Est African American: >89 mL/min
GFR, Est Non African American: >89 mL/min
Glucose, Bld: 128 mg/dL — ABNORMAL HIGH (ref 70–99)
Potassium: 4.7 mEq/L (ref 3.5–5.3)
Sodium: 140 mEq/L (ref 135–145)
Total Bilirubin: 0.5 mg/dL (ref 0.2–1.2)
Total Protein: 6.7 g/dL (ref 6.0–8.3)

## 2013-08-06 LAB — LIPID PANEL
Cholesterol: 149 mg/dL (ref 0–200)
HDL: 30 mg/dL — ABNORMAL LOW (ref >39)
LDL Cholesterol: 50 mg/dL (ref 0–99)
Total CHOL/HDL Ratio: 5 Ratio
Triglycerides: 344 mg/dL — ABNORMAL HIGH (ref <150)
VLDL: 69 mg/dL — ABNORMAL HIGH (ref 0–40)

## 2013-08-06 LAB — TSH: TSH: 1.186 u[IU]/mL (ref 0.350–4.500)

## 2013-08-06 LAB — PSA: PSA: 0.17 ng/mL (ref <=4.00)

## 2013-08-08 LAB — TESTOSTERONE, FREE, TOTAL, SHBG
Sex Hormone Binding: 18 nmol/L (ref 13–71)
Testosterone, Free: 16.5 pg/mL — ABNORMAL LOW (ref 47.0–244.0)
Testosterone-% Free: 2.5 % (ref 1.6–2.9)
Testosterone: 66.88 ng/dL — ABNORMAL LOW (ref 300–890)

## 2013-08-18 ENCOUNTER — Telehealth: Payer: Self-pay | Admitting: *Deleted

## 2013-08-18 NOTE — Telephone Encounter (Signed)
Left message with man who answered home phone to have patient return call to discuss lab results.

## 2013-08-18 NOTE — Telephone Encounter (Signed)
Message copied by Velora Heckler on Thu Aug 18, 2013  2:54 PM ------      Message from: Chari Manning A      Created: Tue Aug 09, 2013 11:01 PM       Please let patient know his triglycerides are elevated and send him a rx of lovaza 2g BID.  Please provide teachings for elevated cholesterol. Patient testosterone is extremely low as well. He can look at the option of monthly testosterone injections. ------

## 2013-08-31 ENCOUNTER — Ambulatory Visit: Payer: Self-pay

## 2013-09-12 ENCOUNTER — Ambulatory Visit: Payer: Self-pay | Admitting: *Deleted

## 2013-12-31 ENCOUNTER — Observation Stay (HOSPITAL_COMMUNITY)
Admission: EM | Admit: 2013-12-31 | Discharge: 2014-01-01 | Disposition: A | Discharge status: Home / Self Care | Payer: Self-pay | Attending: Internal Medicine | Admitting: Internal Medicine

## 2013-12-31 ENCOUNTER — Emergency Department (HOSPITAL_COMMUNITY): Payer: Self-pay

## 2013-12-31 ENCOUNTER — Encounter (HOSPITAL_COMMUNITY): Payer: Self-pay | Admitting: Emergency Medicine

## 2013-12-31 DIAGNOSIS — R0602 Shortness of breath: Secondary | ICD-10-CM | POA: Insufficient documentation

## 2013-12-31 DIAGNOSIS — E782 Mixed hyperlipidemia: Secondary | ICD-10-CM | POA: Diagnosis present

## 2013-12-31 DIAGNOSIS — R609 Edema, unspecified: Secondary | ICD-10-CM | POA: Insufficient documentation

## 2013-12-31 DIAGNOSIS — R112 Nausea with vomiting, unspecified: Secondary | ICD-10-CM | POA: Insufficient documentation

## 2013-12-31 DIAGNOSIS — R079 Chest pain, unspecified: Principal | ICD-10-CM | POA: Diagnosis present

## 2013-12-31 DIAGNOSIS — R0789 Other chest pain: Secondary | ICD-10-CM

## 2013-12-31 DIAGNOSIS — F319 Bipolar disorder, unspecified: Secondary | ICD-10-CM | POA: Insufficient documentation

## 2013-12-31 DIAGNOSIS — Z79899 Other long term (current) drug therapy: Secondary | ICD-10-CM | POA: Insufficient documentation

## 2013-12-31 DIAGNOSIS — Z9889 Other specified postprocedural states: Secondary | ICD-10-CM | POA: Insufficient documentation

## 2013-12-31 DIAGNOSIS — R61 Generalized hyperhidrosis: Secondary | ICD-10-CM | POA: Insufficient documentation

## 2013-12-31 DIAGNOSIS — K219 Gastro-esophageal reflux disease without esophagitis: Secondary | ICD-10-CM | POA: Diagnosis present

## 2013-12-31 DIAGNOSIS — E119 Type 2 diabetes mellitus without complications: Secondary | ICD-10-CM | POA: Insufficient documentation

## 2013-12-31 DIAGNOSIS — I10 Essential (primary) hypertension: Secondary | ICD-10-CM | POA: Insufficient documentation

## 2013-12-31 DIAGNOSIS — E785 Hyperlipidemia, unspecified: Secondary | ICD-10-CM | POA: Diagnosis present

## 2013-12-31 LAB — CBC WITH DIFFERENTIAL/PLATELET
Basophils Absolute: 0 10*3/uL (ref 0.0–0.1)
Basophils Relative: 0 % (ref 0–1)
Eosinophils Absolute: 0.2 10*3/uL (ref 0.0–0.7)
Eosinophils Relative: 1 % (ref 0–5)
HCT: 38.3 % — ABNORMAL LOW (ref 39.0–52.0)
Hemoglobin: 14.1 g/dL (ref 13.0–17.0)
Lymphocytes Relative: 11 % — ABNORMAL LOW (ref 12–46)
Lymphs Abs: 1.3 10*3/uL (ref 0.7–4.0)
MCH: 29.3 pg (ref 26.0–34.0)
MCHC: 36.8 g/dL — ABNORMAL HIGH (ref 30.0–36.0)
MCV: 79.6 fL (ref 78.0–100.0)
Monocytes Absolute: 0.5 10*3/uL (ref 0.1–1.0)
Monocytes Relative: 4 % (ref 3–12)
Neutro Abs: 9.8 10*3/uL — ABNORMAL HIGH (ref 1.7–7.7)
Neutrophils Relative %: 83 % — ABNORMAL HIGH (ref 43–77)
Platelets: 227 10*3/uL (ref 150–400)
RBC: 4.81 MIL/uL (ref 4.22–5.81)
RDW: 13.2 % (ref 11.5–15.5)
WBC: 11.9 10*3/uL — ABNORMAL HIGH (ref 4.0–10.5)

## 2013-12-31 LAB — COMPREHENSIVE METABOLIC PANEL
ALT: 19 U/L (ref 0–53)
AST: 22 U/L (ref 0–37)
Albumin: 4.1 g/dL (ref 3.5–5.2)
Alkaline Phosphatase: 62 U/L (ref 39–117)
Anion gap: 18 — ABNORMAL HIGH (ref 5–15)
BUN: 13 mg/dL (ref 6–23)
CO2: 22 mEq/L (ref 19–32)
Calcium: 9.5 mg/dL (ref 8.4–10.5)
Chloride: 92 mEq/L — ABNORMAL LOW (ref 96–112)
Creatinine, Ser: 0.82 mg/dL (ref 0.50–1.35)
GFR calc Af Amer: >90 mL/min (ref >90)
GFR calc non Af Amer: >90 mL/min (ref >90)
Glucose, Bld: 191 mg/dL — ABNORMAL HIGH (ref 70–99)
Potassium: 3.8 mEq/L (ref 3.7–5.3)
Sodium: 132 mEq/L — ABNORMAL LOW (ref 137–147)
Total Bilirubin: 0.6 mg/dL (ref 0.3–1.2)
Total Protein: 7.3 g/dL (ref 6.0–8.3)

## 2013-12-31 LAB — TROPONIN I: Troponin I: <0.3 ng/mL (ref <0.30)

## 2013-12-31 MED ORDER — ONDANSETRON HCL 4 MG/2ML IJ SOLN: 4.0000 mg | Freq: Once | INTRAMUSCULAR | Status: DC

## 2013-12-31 MED ORDER — ONDANSETRON HCL 4 MG/2ML IJ SOLN
4.0000 mg | Freq: Once | INTRAMUSCULAR | Status: AC
  Administered 2013-12-31: 4 mg via INTRAVENOUS
  Filled 2013-12-31: qty 2

## 2013-12-31 NOTE — ED Notes (Signed)
Pt reports L sided chest pain started tonight. Patient also endorses n/v, diaphoesis. Rates pain 6/10, non-radiating. Pt has history of cardiac stent. Currently AX4, NAD.

## 2013-12-31 NOTE — ED Provider Notes (Signed)
CSN: [id number]     Arrival date & time 12/31/13  2121 History   First MD Initiated Contact with Patient 12/31/13 2138     Chief Complaint  Patient presents with  . Chest Pain     (Consider location/radiation/quality/duration/timing/severity/associated sxs/prior Treatment) Patient is a 53 y.o. male presenting with chest pain. The history is provided by the patient. No language interpreter was used.  Chest Pain Pain location:  L chest Pain quality: aching   Pain radiates to:  Does not radiate Pain radiates to the back: no   Pain severity:  Moderate Associated symptoms: diaphoresis, nausea, shortness of breath and vomiting   Associated symptoms: no abdominal pain and no fever   Associated symptoms comment:  He woke up this afternoon around 5:00 pm with indigestion. He reports taking 4 either Prilosec or Prevacid and having subsequent onset of left chest pain, SOB, diaphoresis, vomiting. Symptoms last around 45 minutes and chest pain resolved. Nausea as been persistent. No further vomiting. No recent fever. No history of cardiac chest pain. There is reference to history of coronary stent but he denies this history. He had a cardiac catheterization in 1998 that he reports was clear of arterial disease and has not had an occasion to see a heart doctor since. He remains pain free at present.   Past Medical History  Diagnosis Date  . Depressed bipolar disorder   . Hypertension   . Diabetes mellitus without complication    Past Surgical History  Procedure Laterality Date  . Spine surgery  2010  . Shoulder arthroscopy with rotator cuff repair Right 2009   Family History  Problem Relation Age of Onset  . Diabetes Mother   . Hyperlipidemia Mother   . Heart disease Father    History  Substance Use Topics  . Smoking status: Never Smoker   . Smokeless tobacco: Not on file  . Alcohol Use: No    Review of Systems  Constitutional: Positive for diaphoresis. Negative for fever and  chills.  HENT: Negative.   Respiratory: Positive for shortness of breath.   Cardiovascular: Positive for chest pain.  Gastrointestinal: Positive for nausea and vomiting. Negative for abdominal pain.  Musculoskeletal: Negative.   Skin: Negative.   Neurological: Negative.  Negative for syncope.      Allergies  Oxytetracycline and Sulfonamide derivatives  Home Medications   Prior to Admission medications   Medication Sig Start Date End Date Taking? Authorizing Provider  BuPROPion HCl (WELLBUTRIN PO) Take 1 tablet by mouth 2 (two) times daily.   Yes Historical Provider, MD  gabapentin ([name]) 300 MG capsule Take 300 mg by mouth 3 (three) times daily.   Yes Historical Provider, MD  lisinopril-hydrochlorothiazide (PRINZIDE,ZESTORETIC) 20-12.5 MG per tablet Take 1 tablet by mouth daily. 08/01/13  Yes Lance Bosch, NP  metFORMIN (GLUCOPHAGE) 500 MG tablet Take 1 tablet (500 mg total) by mouth 2 (two) times daily with a meal. 08/01/13  Yes Lance Bosch, NP  methadone (DOLOPHINE) 10 MG/ML solution Take 155 mg by mouth daily.   Yes Historical Provider, MD  omeprazole (PRILOSEC) 20 MG capsule Take 20 mg by mouth daily as needed (indigestion).   Yes Historical Provider, MD  QUEtiapine Fumarate (SEROQUEL PO) Take 1 tablet by mouth at bedtime.   Yes Historical Provider, MD  gabapentin ([name]) 600 MG tablet Take 1 tablet (600 mg total) by mouth 3 (three) times daily. Patient not taking: Reported on 12/31/2013 08/01/13   Lance Bosch, NP   BP 116/68  mmHg  Pulse 65  Temp(Src) 98.1 F (36.7 C) (Oral)  Resp 19  SpO2 98% Physical Exam  Constitutional: He is oriented to person, place, and time. He appears well-developed and well-nourished.  HENT:  Head: Normocephalic.  Neck: Normal range of motion. Neck supple.  Cardiovascular: Normal rate and regular rhythm.   Pulmonary/Chest: Effort normal and breath sounds normal. He has no wheezes. He has no rales. He exhibits no tenderness.   Abdominal: Soft. Bowel sounds are normal. There is no tenderness. There is no rebound and no guarding.  Musculoskeletal: Normal range of motion.  Trace LE edema.  Neurological: He is alert and oriented to person, place, and time.  Skin: Skin is warm and dry. No rash noted.  Psychiatric: He has a normal mood and affect.    ED Course  Procedures (including critical care time) Labs Review Labs Reviewed  CBC WITH DIFFERENTIAL - Abnormal; Notable for the following:    WBC 11.9 (*)    HCT 38.3 (*)    MCHC 36.8 (*)    Neutrophils Relative % 83 (*)    Neutro Abs 9.8 (*)    Lymphocytes Relative 11 (*)    All other components within normal limits  COMPREHENSIVE METABOLIC PANEL  TROPONIN I    Imaging Review No results found.   EKG Interpretation   Date/Time:  Saturday December 31 2013 21:24:51 EST Ventricular Rate:  75 PR Interval:  182 QRS Duration: 94 QT Interval:  440 QTC Calculation: 491 R Axis:   2 Text Interpretation:  Demand pacemaker; interpretation is based on  intrinsic rhythm Sinus rhythm with Possible Premature atrial complexes  with Abberant conduction Prolonged QT non-spec t wave changes Confirmed by  HARRISON  MD, FORREST ([id number]) on 12/31/2013 9:40:10 PM      MDM   Final diagnoses:  Chest pain    He has a heart score of 6. He has significant risk factors including DM, HTN, family history (father died of MI at 73 yo). Currently pain free. No acute ischemia on EKG and initial troponin is negative. Discussed with Dr. Fransico Him (cardiology) who advises medical admit to rule out. [facility] paged.     Dewaine Oats, PA-C 12/31/13 [id number]  Pamella Pert, MD 01/01/14 [phone]

## 2014-01-01 ENCOUNTER — Observation Stay (HOSPITAL_COMMUNITY): Payer: Self-pay

## 2014-01-01 DIAGNOSIS — I10 Essential (primary) hypertension: Secondary | ICD-10-CM

## 2014-01-01 DIAGNOSIS — R079 Chest pain, unspecified: Secondary | ICD-10-CM

## 2014-01-01 DIAGNOSIS — E785 Hyperlipidemia, unspecified: Secondary | ICD-10-CM

## 2014-01-01 DIAGNOSIS — K219 Gastro-esophageal reflux disease without esophagitis: Secondary | ICD-10-CM

## 2014-01-01 DIAGNOSIS — F3130 Bipolar disorder, current episode depressed, mild or moderate severity, unspecified: Secondary | ICD-10-CM

## 2014-01-01 DIAGNOSIS — F319 Bipolar disorder, unspecified: Secondary | ICD-10-CM | POA: Diagnosis present

## 2014-01-01 DIAGNOSIS — E119 Type 2 diabetes mellitus without complications: Secondary | ICD-10-CM

## 2014-01-01 LAB — CBC
HCT: 34.8 % — ABNORMAL LOW (ref 39.0–52.0)
Hemoglobin: 12.4 g/dL — ABNORMAL LOW (ref 13.0–17.0)
MCH: 28.2 pg (ref 26.0–34.0)
MCHC: 35.6 g/dL (ref 30.0–36.0)
MCV: 79.1 fL (ref 78.0–100.0)
Platelets: 180 10*3/uL (ref 150–400)
RBC: 4.4 MIL/uL (ref 4.22–5.81)
RDW: 13.2 % (ref 11.5–15.5)
WBC: 8.4 10*3/uL (ref 4.0–10.5)

## 2014-01-01 LAB — TROPONIN I
Troponin I: <0.3 ng/mL (ref <0.30)
Troponin I: <0.3 ng/mL (ref <0.30)
Troponin I: <0.3 ng/mL (ref <0.30)

## 2014-01-01 LAB — HEMOGLOBIN A1C
Hgb A1c MFr Bld: 7 % — ABNORMAL HIGH (ref <5.7)
Mean Plasma Glucose: 154 mg/dL — ABNORMAL HIGH (ref <117)

## 2014-01-01 LAB — BASIC METABOLIC PANEL
Anion gap: 11 (ref 5–15)
BUN: 15 mg/dL (ref 6–23)
CO2: 29 mEq/L (ref 19–32)
Calcium: 8.9 mg/dL (ref 8.4–10.5)
Chloride: 95 mEq/L — ABNORMAL LOW (ref 96–112)
Creatinine, Ser: 0.83 mg/dL (ref 0.50–1.35)
GFR calc Af Amer: >90 mL/min (ref >90)
GFR calc non Af Amer: >90 mL/min (ref >90)
Glucose, Bld: 125 mg/dL — ABNORMAL HIGH (ref 70–99)
Potassium: 4 mEq/L (ref 3.7–5.3)
Sodium: 135 mEq/L — ABNORMAL LOW (ref 137–147)

## 2014-01-01 LAB — LIPID PANEL
Cholesterol: 151 mg/dL (ref 0–200)
HDL: 34 mg/dL — ABNORMAL LOW (ref >39)
LDL Cholesterol: 78 mg/dL (ref 0–99)
Total CHOL/HDL Ratio: 4.4 RATIO
Triglycerides: 196 mg/dL — ABNORMAL HIGH (ref <150)
VLDL: 39 mg/dL (ref 0–40)

## 2014-01-01 LAB — GLUCOSE, CAPILLARY
Glucose-Capillary: 201 mg/dL — ABNORMAL HIGH (ref 70–99)
Glucose-Capillary: 160 mg/dL — ABNORMAL HIGH (ref 70–99)
Glucose-Capillary: 128 mg/dL — ABNORMAL HIGH (ref 70–99)
Glucose-Capillary: 246 mg/dL — ABNORMAL HIGH (ref 70–99)
Glucose-Capillary: 164 mg/dL — ABNORMAL HIGH (ref 70–99)

## 2014-01-01 MED ORDER — LISINOPRIL-HYDROCHLOROTHIAZIDE 20-12.5 MG PO TABS: 1.0000 | ORAL_TABLET | Freq: Every day | ORAL | Status: DC

## 2014-01-01 MED ORDER — HYDROCHLOROTHIAZIDE 12.5 MG PO CAPS
12.5000 mg | ORAL_CAPSULE | Freq: Every day | ORAL | Status: DC
  Administered 2014-01-01: 12.5 mg via ORAL
  Filled 2014-01-01: qty 1

## 2014-01-01 MED ORDER — HYDROMORPHONE HCL 1 MG/ML IJ SOLN: 0.5000 mg | INTRAMUSCULAR | Status: DC | PRN

## 2014-01-01 MED ORDER — LISINOPRIL 20 MG PO TABS
20.0000 mg | ORAL_TABLET | Freq: Every day | ORAL | Status: DC
  Administered 2014-01-01: 20 mg via ORAL
  Filled 2014-01-01: qty 1

## 2014-01-01 MED ORDER — ENOXAPARIN SODIUM 30 MG/0.3ML ~~LOC~~ SOLN
30.0000 mg | SUBCUTANEOUS | Status: DC
  Administered 2014-01-01: 30 mg via SUBCUTANEOUS
  Filled 2014-01-01 (×2): qty 0.3

## 2014-01-01 MED ORDER — METHADONE HCL 5 MG PO TABS: 155.0000 mg | ORAL_TABLET | Freq: Every day | ORAL | Status: DC

## 2014-01-01 MED ORDER — OXYCODONE HCL 5 MG PO TABS
5.0000 mg | ORAL_TABLET | ORAL | Status: DC | PRN
  Administered 2014-01-01: 5 mg via ORAL
  Filled 2014-01-01: qty 1

## 2014-01-01 MED ORDER — SODIUM CHLORIDE 0.9 % IJ SOLN: 3.0000 mL | INTRAMUSCULAR | Status: DC | PRN

## 2014-01-01 MED ORDER — SODIUM CHLORIDE 0.9 % IV SOLN: 250.0000 mL | INTRAVENOUS | Status: DC | PRN

## 2014-01-01 MED ORDER — OMEPRAZOLE 20 MG PO CPDR: 20.0000 mg | DELAYED_RELEASE_CAPSULE | Freq: Two times a day (BID) | ORAL | Status: DC

## 2014-01-01 MED ORDER — SODIUM CHLORIDE 0.9 % IJ SOLN: 3.0000 mL | Freq: Two times a day (BID) | INTRAMUSCULAR | Status: DC

## 2014-01-01 MED ORDER — ENOXAPARIN SODIUM 40 MG/0.4ML ~~LOC~~ SOLN: 40.0000 mg | SUBCUTANEOUS | Status: DC

## 2014-01-01 MED ORDER — TECHNETIUM TC 99M SESTAMIBI GENERIC - CARDIOLITE
10.0000 | Freq: Once | INTRAVENOUS | Status: AC | PRN
  Administered 2014-01-01: 10 via INTRAVENOUS

## 2014-01-01 MED ORDER — NITROGLYCERIN 2 % TD OINT
0.5000 [in_us] | TOPICAL_OINTMENT | Freq: Four times a day (QID) | TRANSDERMAL | Status: DC
  Administered 2014-01-01 (×2): 0.5 [in_us] via TOPICAL
  Filled 2014-01-01: qty 30

## 2014-01-01 MED ORDER — QUETIAPINE FUMARATE 50 MG PO TABS
50.0000 mg | ORAL_TABLET | Freq: Every day | ORAL | Status: DC
  Administered 2014-01-01: 50 mg via ORAL
  Filled 2014-01-01: qty 1

## 2014-01-01 MED ORDER — TECHNETIUM TC 99M SESTAMIBI GENERIC - CARDIOLITE
30.0000 | Freq: Once | INTRAVENOUS | Status: AC | PRN
  Administered 2014-01-01: 30 via INTRAVENOUS

## 2014-01-01 MED ORDER — METFORMIN HCL 500 MG PO TABS: 500.0000 mg | ORAL_TABLET | Freq: Two times a day (BID) | ORAL | Status: DC

## 2014-01-01 MED ORDER — REGADENOSON 0.4 MG/5ML IV SOLN
INTRAVENOUS | Status: AC
  Administered 2014-01-01: 0.4 mg
  Filled 2014-01-01: qty 5

## 2014-01-01 MED ORDER — METHADONE HCL 10 MG/ML PO CONC: 155.0000 mg | Freq: Every day | ORAL | Status: DC

## 2014-01-01 MED ORDER — INSULIN ASPART 100 UNIT/ML ~~LOC~~ SOLN
0.0000 [IU] | SUBCUTANEOUS | Status: DC
  Administered 2014-01-01: 3 [IU] via SUBCUTANEOUS
  Administered 2014-01-01: 2 [IU] via SUBCUTANEOUS
  Administered 2014-01-01: 3 [IU] via SUBCUTANEOUS

## 2014-01-01 MED ORDER — PANTOPRAZOLE SODIUM 40 MG PO TBEC
40.0000 mg | DELAYED_RELEASE_TABLET | Freq: Every day | ORAL | Status: DC
  Administered 2014-01-01: 40 mg via ORAL
  Filled 2014-01-01: qty 1

## 2014-01-01 MED ORDER — ACETAMINOPHEN 325 MG PO TABS: 650.0000 mg | ORAL_TABLET | Freq: Four times a day (QID) | ORAL | Status: DC | PRN

## 2014-01-01 MED ORDER — ACETAMINOPHEN 650 MG RE SUPP
650.0000 mg | Freq: Four times a day (QID) | RECTAL | Status: DC | PRN
Start: 2014-01-01 — End: 2014-01-01

## 2014-01-01 MED ORDER — GABAPENTIN 300 MG PO CAPS
300.0000 mg | ORAL_CAPSULE | Freq: Three times a day (TID) | ORAL | Status: DC
  Administered 2014-01-01 (×3): 300 mg via ORAL
  Filled 2014-01-01 (×3): qty 1

## 2014-01-01 MED ORDER — REGADENOSON 0.4 MG/5ML IV SOLN: 0.4000 mg | Freq: Once | INTRAVENOUS | Status: AC

## 2014-01-01 MED ORDER — SODIUM CHLORIDE 0.9 % IJ SOLN
3.0000 mL | Freq: Two times a day (BID) | INTRAMUSCULAR | Status: DC
  Administered 2014-01-01: 3 mL via INTRAVENOUS

## 2014-01-01 MED ORDER — ONDANSETRON HCL 4 MG/2ML IJ SOLN: 4.0000 mg | Freq: Four times a day (QID) | INTRAMUSCULAR | Status: DC | PRN

## 2014-01-01 MED ORDER — ONDANSETRON HCL 4 MG PO TABS
4.0000 mg | ORAL_TABLET | Freq: Four times a day (QID) | ORAL | Status: DC | PRN
  Administered 2014-01-01: 4 mg via ORAL
  Filled 2014-01-01: qty 1

## 2014-01-01 MED ORDER — BUPROPION HCL 75 MG PO TABS
75.0000 mg | ORAL_TABLET | Freq: Two times a day (BID) | ORAL | Status: DC
  Administered 2014-01-01: 75 mg via ORAL
  Filled 2014-01-01 (×3): qty 1

## 2014-01-01 MED ORDER — ALUM & MAG HYDROXIDE-SIMETH 200-200-20 MG/5ML PO SUSP
30.0000 mL | Freq: Four times a day (QID) | ORAL | Status: DC | PRN
Start: 2014-01-01 — End: 2014-01-01

## 2014-01-01 NOTE — Discharge Summary (Signed)
Physician Discharge Summary  [name] [name] TFT:[id number] DOB: 19-Mar-1960 DOA: 12/31/2013  PCP: Scarlette Calico, MD  Admit date: 12/31/2013 Discharge date: 01/01/2014  Time spent: 30 minutes  Recommendations for Outpatient Follow-up:  Reassess BP and adjust medications as needed Close follow up of patient CBG's/diabetes; adjust hypoglycemic regimen as needed  Discharge Diagnoses:  Principal Problem:   Chest pain Active Problems:   Hyperlipidemia   Essential hypertension   GERD   Diabetes mellitus without complication   Depressed bipolar disorder   Discharge Condition: stable and improved. No CP and no SOB. Low test probability Myoview. Patient will follow with cardiology and PCP as an outpatient.  Diet recommendation: heart healthy and low carbohydrates.  Filed Weights   01/01/14 0056 01/01/14 0400  Weight: 105.87 kg (233 lb 6.4 oz) 105.87 kg (233 lb 6.4 oz)    History of present illness:  53 y.o. male with a history of DM2, HTN, and Hyperlipidemia who presents to the ED with complaints of left sided chest pain that radiates into the left shoulder that lasted 45 minutes. His pain was described as "achey" and started around 5 pm. The pain was associated with sob and nausea and diaphoresis. He thought he had indigestion so he took a Prilosec which did not help. In the ED he was evaluated and his initial cardiac workup was negative. He was referred for medical observation and a cardiac rule out. Of note he had a normal Cardiac Catherization in 1998.  Hospital Course:  1-chest pain: resolved now. Patient with some typical features and multiple risk factors (heart score 4-5) -troponin X3 -no telemetry abnormalities -myoview stress test with low probability  -will adjust PPI to twice a day -patient to follow with cardiology in approx 2 weeks  2-HLD: LDL 78 -patient currently not on statins -advise to follow low fat diet -PCP to follow and decide on time to start  statins  3-GERD: continue PPI  4-DM type 2: continue metformin -patient advise to follow low carb diet  5-essential HTN: stable -will continue lisinopril/HCTZ -patient advise to follow low sodium diet  6-chronic pain: continue methadone and neurontin   7-bipolar disorder: continue seroquel and wellbutrin    Procedures:  Myoview stress test 12/20  IMPRESSION: 1. No reversible ischemia. Fixed defect involving the inferior wall is probably related to diaphragmatic attenuation.  2. Normal left ventricular wall motion.  3. Left ventricular ejection fraction is 63%.  4. Low-risk stress test findings*.   Consultations:  Cardiology   Discharge Exam: Filed Vitals:   01/01/14 1054  BP: 137/61  Pulse: 74  Temp:   Resp:     General: no further chest pain, no SOB. Patient denies palpitations, nausea/vomiting Cardiovascular: S1 and S2, no rubs or gallops Respiratory: CTA bilaterally Abd: soft, NT, ND, positive BS Extremities: trace edema, no cyanosis Neuro: none focal  Discharge Instructions You were cared for by a hospitalist during your hospital stay. If you have any questions about your discharge medications or the care you received while you were in the hospital after you are discharged, you can call the unit and asked to speak with the hospitalist on call if the hospitalist that took care of you is not available. Once you are discharged, your primary care physician will handle any further medical issues. Please note that NO REFILLS for any discharge medications will be authorized once you are discharged, as it is imperative that you return to your primary care physician (or establish a relationship with a primary care  physician if you do not have one) for your aftercare needs so that they can reassess your need for medications and monitor your lab values.  Discharge Instructions    Diet - low sodium heart healthy    Complete by:  As directed      Discharge  instructions    Complete by:  As directed   Follow a low carbohydrates and heart healthy diet Take medications as prescribed Arranged follow up with PCP in 2 weeks Cardiology office will contact you for details on follow up appointment          Current Discharge Medication List    CONTINUE these medications which have CHANGED   Details  metFORMIN (GLUCOPHAGE) 500 MG tablet Take 1 tablet (500 mg total) by mouth 2 (two) times daily with a meal.    omeprazole (PRILOSEC) 20 MG capsule Take 1 capsule (20 mg total) by mouth 2 (two) times daily before a meal. Qty: 60 capsule, Refills: 1      CONTINUE these medications which have NOT CHANGED   Details  BuPROPion HCl (WELLBUTRIN PO) Take 1 tablet by mouth 2 (two) times daily.    gabapentin ([name]) 300 MG capsule Take 600 mg by mouth 3 (three) times daily.     lisinopril-hydrochlorothiazide (PRINZIDE,ZESTORETIC) 20-12.5 MG per tablet Take 1 tablet by mouth daily. Qty: 30 tablet, Refills: 4   Associated Diagnoses: Type II or unspecified type diabetes mellitus with neurological manifestations, not stated as uncontrolled    methadone (DOLOPHINE) 10 MG/ML solution Take 155 mg by mouth daily.    QUEtiapine Fumarate (SEROQUEL PO) Take 1 tablet by mouth at bedtime.      STOP taking these medications     BuPROPion HCl (WELLBUTRIN PO)      QUEtiapine Fumarate (SEROQUEL PO)      gabapentin ([name]) 600 MG tablet        Allergies  Allergen Reactions  . Oxytetracycline Rash  . Sulfonamide Derivatives Rash   Follow-up Information    Follow up with Darlin Coco, MD.   Specialty:  Cardiology   Why:  Our office will call you for appt. Needs echo   Contact information:   [address] [place] [address] [phone]       Follow up with Scarlette Calico, MD. Schedule an appointment as soon as possible for a visit in 2 weeks.   Specialty:  Internal Medicine   Contact information:   [address] [address] [phone]        The results of significant diagnostics from this hospitalization (including imaging, microbiology, ancillary and laboratory) are listed below for reference.    Significant Diagnostic Studies: Dg Chest 2 View  12/31/2013   CLINICAL DATA:  Left-sided chest pain beginning earlier today.  EXAM: CHEST  2 VIEW  COMPARISON:  01/27/2008  FINDINGS: The heart size and mediastinal contours are within normal limits. Both lungs are clear. The visualized skeletal structures are unremarkable.  IMPRESSION: No active cardiopulmonary disease.  Stable chest.   Electronically Signed   By: Rolla Flatten M.D.   On: 12/31/2013 23:06   Nm Myocar Multi W/spect W/wall Motion / Ef  01/01/2014   CLINICAL DATA:  53 year old with chest pain.  EXAM: MYOCARDIAL IMAGING WITH SPECT (REST AND PHARMACOLOGIC-STRESS)  GATED LEFT VENTRICULAR WALL MOTION STUDY  LEFT VENTRICULAR EJECTION FRACTION  TECHNIQUE: Standard myocardial SPECT imaging was performed after resting intravenous injection of 10 mCi Tc-29m sestamibi. Subsequently, intravenous infusion  of Lexiscan was performed under the supervision of the Cardiology staff. At peak effect of the drug, 30 mCi Tc-39m sestamibi was injected intravenously and standard myocardial SPECT imaging was performed. Quantitative gated imaging was also performed to evaluate left ventricular wall motion, and estimate left ventricular ejection fraction.  COMPARISON:  None.  FINDINGS: Perfusion: No decreased activity in the left ventricle on stress imaging to suggest reversible ischemia. There is decreased activity involving the inferior wall in the mid and basilar segments on both the rest and stress images. Findings are thought to represent diaphragmatic attenuation because there is not a significant wall motion abnormality in this area.  Wall Motion: Normal left ventricular wall motion. No left ventricular dilation.  Left Ventricular Ejection Fraction: 63 %   End diastolic volume 95 ml  End systolic volume 35 ml  IMPRESSION: 1. No reversible ischemia. Fixed defect involving the inferior wall is probably related to diaphragmatic attenuation.  2. Normal left ventricular wall motion.  3. Left ventricular ejection fraction is 63%.  4. Low-risk stress test findings*.  *2012 Appropriate Use Criteria for Coronary Revascularization Focused Update: J Am Coll Cardiol. 7672;09(4):709-628. http://content.airportbarriers.com.aspx?articleid=1201161   Electronically Signed   By: Markus Daft M.D.   On: 01/01/2014 13:50    Labs: Basic Metabolic Panel:  Recent Labs Lab 12/31/13 2147 01/01/14 0640  NA 132* 135*  K 3.8 4.0  CL 92* 95*  CO2 22 29  GLUCOSE 191* 125*  BUN 13 15  CREATININE 0.82 0.83  CALCIUM 9.5 8.9   Liver Function Tests:  Recent Labs Lab 12/31/13 2147  AST 22  ALT 19  ALKPHOS 62  BILITOT 0.6  PROT 7.3  ALBUMIN 4.1   CBC:  Recent Labs Lab 12/31/13 2147 01/01/14 0640  WBC 11.9* 8.4  NEUTROABS 9.8*  --   HGB 14.1 12.4*  HCT 38.3* 34.8*  MCV 79.6 79.1  PLT 227 180   Cardiac Enzymes:  Recent Labs Lab 12/31/13 2147 01/01/14 0124 01/01/14 0640 01/01/14 1215  TROPONINI <0.30 <0.30 <0.30 <0.30   CBG:  Recent Labs Lab 01/01/14 0113 01/01/14 0425 01/01/14 0731 01/01/14 1150  GLUCAP 201* 160* 128* 246*     Signed:  Barton Dubois  [facility] 01/01/2014, 4:28 PM

## 2014-01-01 NOTE — H&P (Signed)
[facility] Admission History and Physical       [name] [name] GLO:[id number] DOB: [birth year] DOA: 12/31/2013  Referring physician: EDP PCP: Scarlette Calico, MD  Specialists:   Chief Complaint: Chest Pain  HPI: [name] [name] is a 53 y.o. male with a history of DM2, HTN, and Hyperlipidemia who presents to the ED with complaints of left sided chest pain that radiates into the left shoulder that lasted 45 minutes.  His pain was described as "achey" and started around 5 pm.   The pain was associated with sob and nausea and diaphoresis.  He thought he had indigestion so he took a Prilosec which did not help.   In the ED he was evaluated and his initial cardiac workup was negative.  He was referred for medical observation and a cardiac rule out.   Of note he had a normal Cardiac Catherization in 1998.     Review of Systems:  Constitutional: No Weight Loss, No Weight Gain, Night Sweats, Fevers, Chills, Dizziness, Fatigue, or Generalized Weakness HEENT: No Headaches, Difficulty Swallowing,Tooth/Dental Problems,Sore Throat,  No Sneezing, Rhinitis, Ear Ache, Nasal Congestion, or Post Nasal Drip,  Cardio-vascular:  +Chest pain, Orthopnea, PND, Edema in Lower Extremities, Anasarca, Dizziness, Palpitations  Resp: No Dyspnea, No DOE, No Productive Cough, No Non-Productive Cough, No Hemoptysis, No Wheezing.    GI: No Heartburn, Indigestion, Abdominal Pain, +Nausea, +Vomiting, Diarrhea, Hematemesis, Hematochezia, Melena, Change in Bowel Habits,  Loss of Appetite  GU: No Dysuria, Change in Color of Urine, No Urgency or Frequency, No Flank pain.  Musculoskeletal: No Joint Pain or Swelling, No Decreased Range of Motion, No Back Pain.  Neurologic: No Syncope, No Seizures, Muscle Weakness, Paresthesia, Vision Disturbance or Loss, No Diplopia, No Vertigo, No Difficulty Walking,  Skin: No Rash or Lesions. Psych: No Change in Mood or Affect, No Depression or Anxiety, No Memory loss, No Confusion,  or Hallucinations   Past Medical History  Diagnosis Date  . Depressed bipolar disorder   . Hypertension   . Diabetes mellitus without complication       Past Surgical History  Procedure Laterality Date  . Spine surgery  2010  . Shoulder arthroscopy with rotator cuff repair Right 2009       Prior to Admission medications   Medication Sig Start Date End Date Taking? Authorizing Provider  BuPROPion HCl (WELLBUTRIN PO) Take 1 tablet by mouth 2 (two) times daily.   Yes Historical Provider, MD  gabapentin ([name]) 300 MG capsule Take 300 mg by mouth 3 (three) times daily.   Yes Historical Provider, MD  lisinopril-hydrochlorothiazide (PRINZIDE,ZESTORETIC) 20-12.5 MG per tablet Take 1 tablet by mouth daily. 08/01/13  Yes Lance Bosch, NP  metFORMIN (GLUCOPHAGE) 500 MG tablet Take 1 tablet (500 mg total) by mouth 2 (two) times daily with a meal. 08/01/13  Yes Lance Bosch, NP  methadone (DOLOPHINE) 10 MG/ML solution Take 155 mg by mouth daily.   Yes Historical Provider, MD  omeprazole (PRILOSEC) 20 MG capsule Take 20 mg by mouth daily as needed (indigestion).   Yes Historical Provider, MD  QUEtiapine Fumarate (SEROQUEL PO) Take 1 tablet by mouth at bedtime.   Yes Historical Provider, MD  gabapentin ([name]) 600 MG tablet Take 1 tablet (600 mg total) by mouth 3 (three) times daily. Patient not taking: Reported on 12/31/2013 08/01/13   Lance Bosch, NP      Allergies  Allergen Reactions  . Oxytetracycline Rash  . Sulfonamide Derivatives Rash     Social  History:  reports that he has never smoked. He does not have any smokeless tobacco history on file. He reports that he does not drink alcohol or use illicit drugs.     Family History  Problem Relation Age of Onset  . Diabetes Mother   . Hyperlipidemia Mother   . Heart disease Father        Physical Exam:  GEN:  Pleasant Well Nourished and Well Developed  53 y.o. Caucasian male examined  and in no acute distress;  cooperative with exam Filed Vitals:   12/31/13 2230 12/31/13 2330 01/01/14 0000 01/01/14 0056  BP: 116/68 153/88 146/84 131/84  Pulse: 65 66 69 66  Temp:    98.1 F (36.7 C)  TempSrc:      Resp:  16  18  Height:    6\' 2"  (1.88 m)  Weight:    105.87 kg (233 lb 6.4 oz)  SpO2: 98% 92% 99% 100%   Blood pressure 131/84, pulse 66, temperature 98.1 F (36.7 C), temperature source Oral, resp. rate 18, height 6\' 2"  (1.88 m), weight 105.87 kg (233 lb 6.4 oz), SpO2 100 %. PSYCH: He is alert and oriented x4; does not appear anxious does not appear depressed; affect is normal HEENT: Normocephalic and Atraumatic, Mucous membranes pink; PERRLA; EOM intact; Fundi:  Benign;  No scleral icterus, Nares: Patent, Oropharynx: Clear, Fair Dentition,    Neck:  FROM, No Cervical Lymphadenopathy nor Thyromegaly or Carotid Bruit; No JVD; Breasts:: Not examined CHEST WALL: No tenderness CHEST: Normal respiration, clear to auscultation bilaterally HEART: Regular rate and rhythm; no murmurs rubs or gallops BACK: No kyphosis or scoliosis; No CVA tenderness ABDOMEN: Positive Bowel Sounds,  Soft Non-Tender; No Masses, No Organomegaly. Rectal Exam: Not done EXTREMITIES: No Cyanosis, Clubbing, or Edema; No Ulcerations. Genitalia: not examined PULSES: 2+ and symmetric SKIN: Normal hydration no rash or ulceration CNS:  Alert and Oriented x 4, No focal Deficits Vascular: pulses palpable throughout    Labs on Admission:  Basic Metabolic Panel:  Recent Labs Lab 12/31/13 2147  NA 132*  K 3.8  CL 92*  CO2 22  GLUCOSE 191*  BUN 13  CREATININE 0.82  CALCIUM 9.5   Liver Function Tests:  Recent Labs Lab 12/31/13 2147  AST 22  ALT 19  ALKPHOS 62  BILITOT 0.6  PROT 7.3  ALBUMIN 4.1   No results for input(s): LIPASE, AMYLASE in the last 168 hours. No results for input(s): AMMONIA in the last 168 hours. CBC:  Recent Labs Lab 12/31/13 2147  WBC 11.9*  NEUTROABS 9.8*  HGB 14.1  HCT 38.3*  MCV 79.6   PLT 227   Cardiac Enzymes:  Recent Labs Lab 12/31/13 2147  TROPONINI <0.30    BNP (last 3 results) No results for input(s): PROBNP in the last 8760 hours. CBG: No results for input(s): GLUCAP in the last 168 hours.  Radiological Exams on Admission: Dg Chest 2 View  12/31/2013   CLINICAL DATA:  Left-sided chest pain beginning earlier today.  EXAM: CHEST  2 VIEW  COMPARISON:  01/27/2008  FINDINGS: The heart size and mediastinal contours are within normal limits. Both lungs are clear. The visualized skeletal structures are unremarkable.  IMPRESSION: No active cardiopulmonary disease.  Stable chest.   Electronically Signed   By: Rolla Flatten M.D.   On: 12/31/2013 23:06     EKG: Independently reviewed. Normal Sinus Rhythm, No Acute S-T changes   Assessment/Plan:   53 y.o. male with  Principal Problem:  1.    Chest pain   Telemetry Monitoring   Cycle Troponins   Nitropaste, O2 ASA,   2D ECHO in AM       Active Problems:   2.   Hyperlipidemia   Check Lipids     3.   Essential hypertension   Continue Lisinopril/HCTZ   Monitor BPs     4.   Diabetes mellitus without complication   Holding Metformin    SSI coverage PRN   Check HbA1C in AM     5.   GERD   Protonix     6.   Depressed bipolar disorder   Verify Seroquel Dosage     7.   DVT Prophylaxis   Lovenox    Code Status:    FULL CODE Family Communication: Parents at Bedside   Disposition Plan:      Observation  Time spent: [address] [place] [address] [facility] Pager [phone]   If [place] Please Contact the Day Rounding Team MD for [facility]  If 7PM-7AM, Please Contact Night-Floor Coverage  [email or web address] Password [email or web address] 01/01/2014, 1:17 AM

## 2014-01-01 NOTE — Progress Notes (Signed)
Utilization Review Completed.   Andras Grunewald, RN, BSN Nurse Case Manager  

## 2014-01-01 NOTE — Consult Note (Signed)
CARDIOLOGY CONSULT NOTE   Patient ID: [name] [name] MRN: [id number], DOB/AGE: 52-52-1962   Admit date: 12/31/2013 Date of Consult: 01/01/2014   Primary Physician: Scarlette Calico, MD Primary Cardiologist: None  Pt. Profile  53 year old gentleman with multiple risk factors admitted with chest discomfort diaphoresis and nausea.  Troponins are negative  Problem List  Past Medical History  Diagnosis Date  . Depressed bipolar disorder   . Hypertension   . Diabetes mellitus without complication     Past Surgical History  Procedure Laterality Date  . Spine surgery  2010  . Shoulder arthroscopy with rotator cuff repair Right 2009     Allergies  Allergies  Allergen Reactions  . Oxytetracycline Rash  . Sulfonamide Derivatives Rash    HPI   This 53 year old gentleman was admitted last evening after experiencing left-sided chest discomfort with radiation up to the left shoulder, diaphoresis and nausea followed by vomiting.  No diarrhea.  The patient does not have any history of known coronary disease.  He states that he had a heart catheterization in about 1998 which did not show any blockage.  The patient has multiple risk factors for coronary disease including diabetes mellitus, hypertension, and hypercholesterolemia.  The patient also has multiple other health issues including back pain and neuropathy.  He has applied for disability.  Inpatient Medications  . buPROPion  75 mg Oral BID  . enoxaparin (LOVENOX) injection  30 mg Subcutaneous Q24H  . gabapentin  300 mg Oral TID  . lisinopril  20 mg Oral Daily   And  . hydrochlorothiazide  12.5 mg Oral Daily  . insulin aspart  0-9 Units Subcutaneous 6 times per day  . methadone (DOLOPHINE) 155 mg  155 mg Oral Daily  . nitroGLYCERIN  0.5 inch Topical 4 times per day  . pantoprazole  40 mg Oral Daily  . QUEtiapine  50 mg Oral QHS  . sodium chloride  3 mL Intravenous Q12H  . sodium chloride  3 mL Intravenous Q12H     Family History Family History  Problem Relation Age of Onset  . Diabetes Mother   . Hyperlipidemia Mother   . Heart disease Father      Social History History   Social History  . Marital Status: Single    Spouse Name: N/A    Number of Children: N/A  . Years of Education: N/A   Occupational History  . Not on file.   Social History Main Topics  . Smoking status: Never Smoker   . Smokeless tobacco: Not on file  . Alcohol Use: No  . Drug Use: No  . Sexual Activity: Not on file   Other Topics Concern  . Not on file   Social History Narrative     Review of Systems  General:  No chills, fever, night sweats or weight changes.  Cardiovascular:  No chest pain, dyspnea on exertion, edema, orthopnea, palpitations, paroxysmal nocturnal dyspnea. Dermatological: No rash, lesions/masses Respiratory: No cough, dyspnea Urologic: No hematuria, dysuria Abdominal:   No nausea, vomiting, diarrhea, bright red blood per rectum, melena, or hematemesis Neurologic:  No visual changes, wkns, changes in mental status.  Chronic back pain. All other systems reviewed and are otherwise negative except as noted above.  Physical Exam  Blood pressure 107/59, pulse 59, temperature 98.6 F (37 C), temperature source Oral, resp. rate 16, height 6\' 2"  (1.88 m), weight 233 lb 6.4 oz (105.87 kg), SpO2 99 %.  General: Pleasant, NAD Psych: Normal affect. Neuro: Alert and  oriented X 3. Moves all extremities spontaneously. HEENT: Normal  Neck: Supple without bruits or JVD. Lungs:  Resp regular and unlabored, CTA. Heart: RRR no s3, s4, or murmurs. Abdomen: Soft, non-tender, non-distended, BS + x 4.  Extremities: No clubbing, cyanosis or edema. DP/PT/Radials 2+ and equal bilaterally.  Labs   Recent Labs  12/31/13 2147 01/01/14 0124 01/01/14 0640  TROPONINI <0.30 <0.30 <0.30   Lab Results  Component Value Date   WBC 8.4 01/01/2014   HGB 12.4* 01/01/2014   HCT 34.8* 01/01/2014   MCV 79.1  01/01/2014   PLT 180 01/01/2014     Recent Labs Lab 12/31/13 2147 01/01/14 0640  NA 132* 135*  K 3.8 4.0  CL 92* 95*  CO2 22 29  BUN 13 15  CREATININE 0.82 0.83  CALCIUM 9.5 8.9  PROT 7.3  --   BILITOT 0.6  --   ALKPHOS 62  --   ALT 19  --   AST 22  --   GLUCOSE 191* 125*   Lab Results  Component Value Date   CHOL 151 01/01/2014   HDL 34* 01/01/2014   LDLCALC 78 01/01/2014   TRIG 196* 01/01/2014   No results found for: DDIMER  Radiology/Studies  Dg Chest 2 View  12/31/2013   CLINICAL DATA:  Left-sided chest pain beginning earlier today.  EXAM: CHEST  2 VIEW  COMPARISON:  01/27/2008  FINDINGS: The heart size and mediastinal contours are within normal limits. Both lungs are clear. The visualized skeletal structures are unremarkable.  IMPRESSION: No active cardiopulmonary disease.  Stable chest.   Electronically Signed   By: Rolla Flatten M.D.   On: 12/31/2013 23:06    ECG on 12/19  Demand pacemaker; interpretation is based on intrinsic rhythm Sinus rhythm with Possible Premature atrial complexes with Abberant conduction Prolonged QT non-spec t wave changes  ASSESSMENT AND PLAN  1.  Chest pain with multiple risk factors for ischemic heart disease.  EKG is nonacute.  Troponins are normal 3 2.  Prolonged QT interval possibly secondary to antidepressants 3.  Diabetes mellitus 4.  Essential hypertension 5.  GERD 6.  Bipolar disease with depression 7.  Chronic back pain on methadone  Plan: We will get a Lexiscan Myoview stress test this morning.  We will also check a echocardiogram.  If above studies normal, possible discharge later today.  Signed, Darlin Coco, MD  01/01/2014, 7:50 AM

## 2014-01-03 ENCOUNTER — Telehealth: Payer: Self-pay

## 2014-01-03 MED ORDER — GABAPENTIN 300 MG PO CAPS: 600.0000 mg | ORAL_CAPSULE | Freq: Three times a day (TID) | ORAL | Status: DC

## 2014-01-03 NOTE — Telephone Encounter (Signed)
Patient called requesting a refill on his gabapentin [facility]

## 2014-01-17 ENCOUNTER — Other Ambulatory Visit (HOSPITAL_COMMUNITY): Payer: Self-pay

## 2014-01-17 ENCOUNTER — Ambulatory Visit: Payer: Self-pay | Admitting: Physician Assistant

## 2014-01-28 ENCOUNTER — Encounter (HOSPITAL_COMMUNITY): Payer: Self-pay | Admitting: Cardiology

## 2014-01-28 ENCOUNTER — Emergency Department (HOSPITAL_COMMUNITY)
Admission: EM | Admit: 2014-01-28 | Discharge: 2014-01-29 | Disposition: A | Discharge status: Home / Self Care | Payer: MEDICAID | Attending: Emergency Medicine | Admitting: Emergency Medicine

## 2014-01-28 DIAGNOSIS — F319 Bipolar disorder, unspecified: Secondary | ICD-10-CM | POA: Insufficient documentation

## 2014-01-28 DIAGNOSIS — E119 Type 2 diabetes mellitus without complications: Secondary | ICD-10-CM | POA: Insufficient documentation

## 2014-01-28 DIAGNOSIS — Z79899 Other long term (current) drug therapy: Secondary | ICD-10-CM | POA: Insufficient documentation

## 2014-01-28 DIAGNOSIS — T403X2A Poisoning by methadone, intentional self-harm, initial encounter: Secondary | ICD-10-CM | POA: Insufficient documentation

## 2014-01-28 DIAGNOSIS — I10 Essential (primary) hypertension: Secondary | ICD-10-CM | POA: Insufficient documentation

## 2014-01-28 DIAGNOSIS — T50902A Poisoning by unspecified drugs, medicaments and biological substances, intentional self-harm, initial encounter: Secondary | ICD-10-CM

## 2014-01-28 LAB — ETHANOL: Alcohol, Ethyl (B): <5 mg/dL (ref 0–9)

## 2014-01-28 LAB — COMPREHENSIVE METABOLIC PANEL
ALT: 22 U/L (ref 0–53)
AST: 24 U/L (ref 0–37)
Albumin: 4.2 g/dL (ref 3.5–5.2)
Alkaline Phosphatase: 47 U/L (ref 39–117)
Anion gap: 13 (ref 5–15)
BUN: 16 mg/dL (ref 6–23)
CO2: 25 mmol/L (ref 19–32)
Calcium: 9.4 mg/dL (ref 8.4–10.5)
Chloride: 96 mEq/L (ref 96–112)
Creatinine, Ser: 1.02 mg/dL (ref 0.50–1.35)
GFR calc Af Amer: >90 mL/min (ref >90)
GFR calc non Af Amer: 83 mL/min — ABNORMAL LOW (ref >90)
Glucose, Bld: 200 mg/dL — ABNORMAL HIGH (ref 70–99)
Potassium: 4 mmol/L (ref 3.5–5.1)
Sodium: 134 mmol/L — ABNORMAL LOW (ref 135–145)
Total Bilirubin: 1 mg/dL (ref 0.3–1.2)
Total Protein: 6.8 g/dL (ref 6.0–8.3)

## 2014-01-28 LAB — CBC
HCT: 36.4 % — ABNORMAL LOW (ref 39.0–52.0)
Hemoglobin: 13.1 g/dL (ref 13.0–17.0)
MCH: 28.2 pg (ref 26.0–34.0)
MCHC: 36 g/dL (ref 30.0–36.0)
MCV: 78.4 fL (ref 78.0–100.0)
Platelets: 205 10*3/uL (ref 150–400)
RBC: 4.64 MIL/uL (ref 4.22–5.81)
RDW: 13.3 % (ref 11.5–15.5)
WBC: 6.3 10*3/uL (ref 4.0–10.5)

## 2014-01-28 LAB — RAPID URINE DRUG SCREEN, HOSP PERFORMED
Amphetamines: NOT DETECTED
Barbiturates: NOT DETECTED
Benzodiazepines: NOT DETECTED
Cocaine: NOT DETECTED
Opiates: NOT DETECTED
Tetrahydrocannabinol: NOT DETECTED

## 2014-01-28 LAB — SALICYLATE LEVEL: Salicylate Lvl: <4 mg/dL (ref 2.8–20.0)

## 2014-01-28 LAB — ACETAMINOPHEN LEVEL: Acetaminophen (Tylenol), Serum: <10 ug/mL — ABNORMAL LOW (ref 10–30)

## 2014-01-28 MED ORDER — LORAZEPAM 1 MG PO TABS
1.0000 mg | ORAL_TABLET | Freq: Three times a day (TID) | ORAL | Status: DC | PRN
  Administered 2014-01-29: 1 mg via ORAL
  Filled 2014-01-28: qty 1

## 2014-01-28 MED ORDER — LISINOPRIL-HYDROCHLOROTHIAZIDE 20-12.5 MG PO TABS: 1.0000 | ORAL_TABLET | Freq: Every day | ORAL | Status: DC

## 2014-01-28 MED ORDER — ONDANSETRON HCL 4 MG PO TABS
4.0000 mg | ORAL_TABLET | Freq: Three times a day (TID) | ORAL | Status: DC | PRN
  Administered 2014-01-28 – 2014-01-29 (×2): 4 mg via ORAL
  Filled 2014-01-28 (×2): qty 1

## 2014-01-28 MED ORDER — QUETIAPINE FUMARATE 25 MG PO TABS
25.0000 mg | ORAL_TABLET | Freq: Every day | ORAL | Status: DC
  Administered 2014-01-28: 25 mg via ORAL
  Filled 2014-01-28: qty 1

## 2014-01-28 MED ORDER — DICYCLOMINE HCL 10 MG PO CAPS
10.0000 mg | ORAL_CAPSULE | Freq: Once | ORAL | Status: AC
  Administered 2014-01-28: 10 mg via ORAL
  Filled 2014-01-28: qty 1

## 2014-01-28 MED ORDER — METFORMIN HCL 500 MG PO TABS
500.0000 mg | ORAL_TABLET | Freq: Two times a day (BID) | ORAL | Status: DC
  Administered 2014-01-28 – 2014-01-29 (×2): 500 mg via ORAL
  Filled 2014-01-28 (×2): qty 1

## 2014-01-28 MED ORDER — ACETAMINOPHEN 325 MG PO TABS: 650.0000 mg | ORAL_TABLET | ORAL | Status: DC | PRN

## 2014-01-28 MED ORDER — LISINOPRIL 20 MG PO TABS
20.0000 mg | ORAL_TABLET | Freq: Every day | ORAL | Status: DC
Start: 2014-01-28 — End: 2014-01-29
  Administered 2014-01-28 – 2014-01-29 (×2): 20 mg via ORAL
  Filled 2014-01-28 (×2): qty 1

## 2014-01-28 MED ORDER — HYDROCHLOROTHIAZIDE 12.5 MG PO CAPS
12.5000 mg | ORAL_CAPSULE | Freq: Every day | ORAL | Status: DC
  Administered 2014-01-28 – 2014-01-29 (×2): 12.5 mg via ORAL
  Filled 2014-01-28 (×2): qty 1

## 2014-01-28 MED ORDER — ALUM & MAG HYDROXIDE-SIMETH 200-200-20 MG/5ML PO SUSP: 30.0000 mL | ORAL | Status: DC | PRN

## 2014-01-28 MED ORDER — IBUPROFEN 400 MG PO TABS: 600.0000 mg | ORAL_TABLET | Freq: Three times a day (TID) | ORAL | Status: DC | PRN

## 2014-01-28 MED ORDER — PANTOPRAZOLE SODIUM 40 MG PO TBEC
40.0000 mg | DELAYED_RELEASE_TABLET | Freq: Every day | ORAL | Status: DC
  Administered 2014-01-28 – 2014-01-29 (×2): 40 mg via ORAL
  Filled 2014-01-28 (×2): qty 1

## 2014-01-28 MED ORDER — BUPROPION HCL 75 MG PO TABS
75.0000 mg | ORAL_TABLET | Freq: Two times a day (BID) | ORAL | Status: DC
  Administered 2014-01-28 – 2014-01-29 (×3): 75 mg via ORAL
  Filled 2014-01-28 (×5): qty 1

## 2014-01-28 MED ORDER — ONDANSETRON 4 MG PO TBDP
8.0000 mg | ORAL_TABLET | Freq: Once | ORAL | Status: AC
  Administered 2014-01-28: 8 mg via ORAL
  Filled 2014-01-28: qty 2

## 2014-01-28 NOTE — ED Notes (Signed)
Staffing office and Charge RN made aware of pt being suicidal.

## 2014-01-28 NOTE — ED Notes (Signed)
For Contact, call [name] Stabs (brother) at [phone].
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
## 2014-01-28 NOTE — ED Notes (Signed)
Pt placed in paper scrubs and security to wand patient at triage. Belongings given to brother pt pt request.

## 2014-01-28 NOTE — ED Notes (Signed)
Pt reports that he has been having SI for the past couple of days. Reports that he takes methadone and has been taking 155mg  daily. Reports that he took a dose in the clinic yesterday. Pt denies any plan for self harm. Pt denies any drugs or ETOH.

## 2014-01-28 NOTE — ED Notes (Signed)
STATES HAS PSYCH AT [place] [facility] 12/27/13 AND HAS NEXT APPT ON 02/02/13.

## 2014-01-28 NOTE — ED Notes (Signed)
LUNCH TRAY ORDERED FOR PATIENT
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
## 2014-01-28 NOTE — ED Provider Notes (Signed)
CSN: [id number]     Arrival date & time 01/28/14  0945 History   First MD Initiated Contact with Patient 01/28/14 [phone]     Chief Complaint  Patient presents with  . Suicidal  . Nausea     (Consider location/radiation/quality/duration/timing/severity/associated sxs/prior Treatment) HPI [name] [name] is a 54 y.o. male with a history of bipolar, diabetes, hypertension comes in for evaluation of methadone overdose. Patient states that he typically takes 155 mg of methadone daily. He went to the pain clinic yesterday at 6:15 AM where he received his daily dose and weekend dosages. He reports he took both of his weekend doses yesterday (1 at 6 PM and another this morning at 4:30 AM)  for a total of 3 doses, 465 mg. He reports trying to kill himself because "I'm tired of dealing with all of this, my health has gone downhill and my wife left me". He denies any homicidal ideations. Denies hallucinations or delusions. Denies any alcohol or benzodiazepine consumption. His brother accompanies him and reports that he has overdosed on methadone at least 5 other times that he is aware of. Patient reports associated abdominal cramping, nausea and vomiting. Denies any other headache, chest pain, shortness of breath, numbness or weakness.  Past Medical History  Diagnosis Date  . Depressed bipolar disorder   . Hypertension   . Diabetes mellitus without complication    Past Surgical History  Procedure Laterality Date  . Spine surgery  2010  . Shoulder arthroscopy with rotator cuff repair Right 2009   Family History  Problem Relation Age of Onset  . Diabetes Mother   . Hyperlipidemia Mother   . Heart disease Father    History  Substance Use Topics  . Smoking status: Never Smoker   . Smokeless tobacco: Not on file  . Alcohol Use: No    Review of Systems  All other systems reviewed and are negative. A 10 point review of systems was completed and was negative except for pertinent positives and  negatives as mentioned in the history of present illness      Allergies  Oxytetracycline and Sulfonamide derivatives  Home Medications   Prior to Admission medications   Medication Sig Start Date End Date Taking? Authorizing Provider  methadone (DOLOPHINE) 10 MG/ML solution Take 155 mg by mouth daily.   Yes Historical Provider, MD  BuPROPion HCl (WELLBUTRIN PO) Take 1 tablet by mouth 2 (two) times daily.    Historical Provider, MD  gabapentin ([name]) 300 MG capsule Take 2 capsules (600 mg total) by mouth 3 (three) times daily. 01/03/14   Lance Bosch, NP  lisinopril-hydrochlorothiazide (PRINZIDE,ZESTORETIC) 20-12.5 MG per tablet Take 1 tablet by mouth daily. 08/01/13   Lance Bosch, NP  metFORMIN (GLUCOPHAGE) 500 MG tablet Take 1 tablet (500 mg total) by mouth 2 (two) times daily with a meal. 01/02/14   Barton Dubois, MD  omeprazole (PRILOSEC) 20 MG capsule Take 1 capsule (20 mg total) by mouth 2 (two) times daily before a meal. 01/01/14   Barton Dubois, MD  QUEtiapine Fumarate (SEROQUEL PO) Take 1 tablet by mouth at bedtime.    Historical Provider, MD   BP 185/88 mmHg  Pulse 66  Temp(Src) 97.3 F (36.3 C) (Oral)  Resp 18  Wt 233 lb (105.688 kg)  SpO2 100% Physical Exam  Constitutional: He is oriented to person, place, and time. He appears well-developed and well-nourished.  Patient is alert and oriented 4, GCS 15.  HENT:  Head: Normocephalic and atraumatic.  Mouth/Throat: Oropharynx is clear and moist.  Eyes: Conjunctivae and EOM are normal. Pupils are equal, round, and reactive to light. Right eye exhibits no discharge. Left eye exhibits no discharge. No scleral icterus.  Neck: Neck supple.  Cardiovascular: Normal rate, regular rhythm and normal heart sounds.   Pulmonary/Chest: Effort normal and breath sounds normal. No respiratory distress. He has no wheezes. He has no rales.  No evidence of respiratory distress.  Abdominal: Soft. There is no tenderness.  Very mild  diffuse tenderness.  Musculoskeletal: He exhibits no tenderness.  Neurological: He is alert and oriented to person, place, and time.  Cranial Nerves II-XII grossly intact  Skin: Skin is warm and dry. No rash noted.  Psychiatric: He has a normal mood and affect.  Nursing note and vitals reviewed.   ED Course  Procedures (including critical care time) Labs Review Labs Reviewed  ACETAMINOPHEN LEVEL  CBC  COMPREHENSIVE METABOLIC PANEL  ETHANOL  SALICYLATE LEVEL  URINE RAPID DRUG SCREEN (HOSP PERFORMED)    Imaging Review No results found.   EKG Interpretation None     Meds given in ED:  Medications  buPROPion [facility]) tablet 75 mg (75 mg Oral Given 01/28/14 1514)  metFORMIN (GLUCOPHAGE) tablet 500 mg (500 mg Oral Given 01/28/14 1803)  pantoprazole (PROTONIX) EC tablet 40 mg (40 mg Oral Given 01/28/14 1515)  QUEtiapine (SEROQUEL) tablet 25 mg (not administered)  lisinopril (PRINIVIL,ZESTRIL) tablet 20 mg (20 mg Oral Given 01/28/14 1514)  hydrochlorothiazide (MICROZIDE) capsule 12.5 mg (12.5 mg Oral Given 01/28/14 1514)  LORazepam (ATIVAN) tablet 1 mg (not administered)  acetaminophen (TYLENOL) tablet 650 mg (not administered)  ibuprofen (ADVIL,MOTRIN) tablet 600 mg (not administered)  ondansetron (ZOFRAN) tablet 4 mg (4 mg Oral Given 01/28/14 1803)  alum & mag hydroxide-simeth (MAALOX/MYLANTA) 200-200-20 MG/5ML suspension 30 mL (not administered)  ondansetron (ZOFRAN-ODT) disintegrating tablet 8 mg (8 mg Oral Given 01/28/14 1100)  dicyclomine (BENTYL) capsule 10 mg (10 mg Oral Given 01/28/14 1100)    New Prescriptions   No medications on file   Filed Vitals:   01/28/14 1230 01/28/14 1300 01/28/14 1330 01/28/14 1755  BP: 147/82 151/86 134/82 145/77  Pulse: 58 64 61 63  Temp:    98.6 F (37 C)  TempSrc:    Oral  Resp: 9 11 18 18   Weight:      SpO2: 89% 97% 93% 100%    MDM  Pt here for Suicide attempt via Methadone overdose. Brother is with him and reports he has done  this multiple times. PE- Pt is alert and oriented to person, place, time and situation. Mild stomach discomfort diffusely--improved with Zofran and Bentyl. No vomiting in ED, tolerating PO. Vitals are stable with no evidence of respiratory compromise. Labs noncontributory TTS to see in ED and dispo accordingly. Discussed pt presentation and ED course with attending, Dr. Doy Mince.  Final diagnoses:  Suicide attempt by drug ingestion, initial encounter        Verl Dicker, PA-C 01/28/14 [place] III, MD 01/30/14 [phone]
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
## 2014-01-29 DIAGNOSIS — F3130 Bipolar disorder, current episode depressed, mild or moderate severity, unspecified: Secondary | ICD-10-CM

## 2014-01-29 DIAGNOSIS — F1994 Other psychoactive substance use, unspecified with psychoactive substance-induced mood disorder: Secondary | ICD-10-CM

## 2014-01-29 DIAGNOSIS — F1120 Opioid dependence, uncomplicated: Secondary | ICD-10-CM

## 2014-01-29 NOTE — Consult Note (Signed)
Telepsych Consultation   Reason for Consult:  SI in context of opioid dependency  Referring Physician:  EDP  [name] [name] is an 54 y.o. male.  Assessment: AXIS I:  Substance Induced Mood Disorder  Opioid dependency severe  Bipolar 1 by history most recent episode depressed mild AXIS II:  Deferred AXIS III:   Past Medical History  Diagnosis Date  . Depressed bipolar disorder   . Hypertension   . Diabetes mellitus without complication    AXIS IV:  economic problems, problems related to social environment and problems with primary support group AXIS V:  61-70 mild symptoms  Plan:  No evidence of imminent risk to self or others at present.    Subjective:   [name] [name] is a 54 y.o. male [name] [name] admitted with suicidal ideation without plan. HPI:  [name] [name] is a 54 year old Caucasian non Hispanic or Latino male who presented to the [facility] for suicidal ideation in the context of opioid dependency.  At the time of the initial evaluation in the Emergency room [name] [name] reported that he was ready to get off the Methodone and wanted assistance in managing withdrawal symptoms. [name] [name] has a long history of opioid dependency stemming from a back injury he sustained 5 years ago.   He has been in a methodone maintenance program for the past 2/12 years and is currently on a dose of 155 mg daily.  [name] [name] reports feelings of guilt surrounding his need to depend on the financial assistance of his parents while he engages in addiction treatment.  [name] [name] endorses sadness and feels that he became overwhelmed yesterday with withdrawal symptoms in the context of unemployment, lack of financial security and his feelings of betrayal by his ex wife who left him for another man two years ago.  [name] [name] has been calm and cooperative in the ED.  Today [name] [name] reports that he has had time to reflect on his situation and feels that he wants to continue methadone maintenance rather than pursue straight  detox and has a follow up appointment scheduled for January 21st at [facility] and for tomorrow am at the Methadone clinic.     [name] [name] denies suicidal ideation, feelings of hopelessness, or anhedonia. [name] [name] speech is clear, logical and goal directed.  Thought processes are coherent with some circumstantiality.   [name] [name] denies AVH, HI, or SIB.  No evidence of delusions and [name] [name] does not attend to internal stimuli Given patients intention of follow through with outpatient treatment and decision not to pursue detox.  In this writers opinion he does not meet criteria for involuntary commitment, and there is no evidence of imminent risk to self or others at present.  HPI Elements:   Location:  generalized. Quality:  acute. Severity:  mild. Timing:  SI past few days in context of withdrawal. Duration:  past few days. Context:  situationally dependent on withdrawal symptoms and psychosocial stressors .  Past Psychiatric History: Past Medical History  Diagnosis Date  . Depressed bipolar disorder   . Hypertension   . Diabetes mellitus without complication     reports that he has never smoked. He does not have any smokeless tobacco history on file. He reports that he does not drink alcohol or use illicit drugs. Family History  Problem Relation Age of Onset  . Diabetes Mother   . Hyperlipidemia Mother   . Heart disease Father      Living Arrangements: Parent Can pt return to current living arrangement?: Yes Allergies:  Allergies  Allergen Reactions  . Oxytetracycline Rash  . Sulfonamide Derivatives Rash    ACT Assessment Complete:  Yes:    Educational Status    Risk to Self: Risk to self with the past 6 months Suicidal Ideation: Yes-Currently Present Suicidal Intent: No Is [name] [name] at risk for suicide?: Yes Suicidal Plan?: No Access to Means: No What has been your use of drugs/alcohol within the last 12 months?:  (Methodone) Previous Attempts/Gestures: Yes How many times?: 1 Other  Self Harm Risks: Depression Triggers for Past Attempts: Other (Comment) (wife left him) Intentional Self Injurious Behavior: None Family Suicide History: No Recent stressful life event(s): Divorce, Job Loss Persecutory voices/beliefs?: No Depression: Yes Depression Symptoms: Despondent, Loss of interest in usual pleasures, Feeling worthless/self pity Substance abuse history and/or treatment for substance abuse?: Yes Suicide prevention information given to non-admitted patients: Not applicable  Risk to Others: Risk to Others within the past 6 months Homicidal Ideation: No Thoughts of Harm to Others: No Current Homicidal Intent: No Current Homicidal Plan: No Access to Homicidal Means: No Identified Victim:  (N/A) History of harm to others?: No Assessment of Violence: None Noted Violent Behavior Description:  (N/A) Does [name] [name] have access to weapons?: No Criminal Charges Pending?: No Does [name] [name] have a court date: No  Abuse: Abuse/Neglect Assessment (Assessment to be complete while [name] [name] is alone) Physical Abuse: Denies Verbal Abuse: Denies Sexual Abuse: Denies Exploitation of [name] [name]/[name] [name]'s resources: Denies Self-Neglect: Denies  Prior Inpatient Therapy:    Prior Outpatient Therapy:    Additional Information:                    Objective: Blood pressure 117/74, pulse 69, temperature 98 F (36.7 C), temperature source Oral, resp. rate 18, weight 105.688 kg (233 lb), SpO2 100 %.Body mass index is 29.9 kg/(m^2). Results for orders placed or performed during the hospital encounter of 01/28/14 (from the past 72 hour(s))  Acetaminophen level     Status: Abnormal   Collection Time: 01/28/14 10:30 AM  Result Value Ref Range   Acetaminophen (Tylenol), Serum <10.0 (L) 10 - 30 ug/mL    Comment:        THERAPEUTIC CONCENTRATIONS VARY SIGNIFICANTLY. A RANGE OF 10-30 ug/mL MAY BE AN EFFECTIVE CONCENTRATION FOR MANY PATIENTS. HOWEVER, SOME ARE BEST TREATED AT  CONCENTRATIONS OUTSIDE THIS RANGE. ACETAMINOPHEN CONCENTRATIONS >150 ug/mL AT 4 HOURS AFTER INGESTION AND >50 ug/mL AT 12 HOURS AFTER INGESTION ARE OFTEN ASSOCIATED WITH TOXIC REACTIONS.   CBC     Status: Abnormal   Collection Time: 01/28/14 10:30 AM  Result Value Ref Range   WBC 6.3 4.0 - 10.5 K/uL   RBC 4.64 4.22 - 5.81 MIL/uL   Hemoglobin 13.1 13.0 - 17.0 g/dL   HCT 36.4 (L) 39.0 - 52.0 %   MCV 78.4 78.0 - 100.0 fL   MCH 28.2 26.0 - 34.0 pg   MCHC 36.0 30.0 - 36.0 g/dL   RDW 13.3 11.5 - 15.5 %   Platelets 205 150 - 400 K/uL  Comprehensive metabolic panel     Status: Abnormal   Collection Time: 01/28/14 10:30 AM  Result Value Ref Range   Sodium 134 (L) 135 - 145 mmol/L    Comment: Please note change in reference range.   Potassium 4.0 3.5 - 5.1 mmol/L    Comment: Please note change in reference range.   Chloride 96 96 - 112 mEq/L   CO2 25 19 - 32 mmol/L   Glucose, Bld 200 (H) 70 -  99 mg/dL   BUN 16 6 - 23 mg/dL   Creatinine, Ser 1.02 0.50 - 1.35 mg/dL   Calcium 9.4 8.4 - 10.5 mg/dL   Total Protein 6.8 6.0 - 8.3 g/dL   Albumin 4.2 3.5 - 5.2 g/dL   AST 24 0 - 37 U/L   ALT 22 0 - 53 U/L   Alkaline Phosphatase 47 39 - 117 U/L   Total Bilirubin 1.0 0.3 - 1.2 mg/dL   GFR calc non Af Amer 83 (L) >90 mL/min   GFR calc Af Amer >90 >90 mL/min    Comment: (NOTE) The eGFR has been calculated using the CKD EPI equation. This calculation has not been validated in all clinical situations. eGFR's persistently <90 mL/min signify possible Chronic Kidney Disease.    Anion gap 13 5 - 15  Ethanol (ETOH)     Status: None   Collection Time: 01/28/14 10:30 AM  Result Value Ref Range   Alcohol, Ethyl (B) <5 0 - 9 mg/dL    Comment:        LOWEST DETECTABLE LIMIT FOR SERUM ALCOHOL IS 11 mg/dL FOR MEDICAL PURPOSES ONLY   Salicylate level     Status: None   Collection Time: 01/28/14 10:30 AM  Result Value Ref Range   Salicylate Lvl <3.5 2.8 - 20.0 mg/dL  Urine Drug Screen      Status: None   Collection Time: 01/28/14 10:30 AM  Result Value Ref Range   Opiates NONE DETECTED NONE DETECTED   Cocaine NONE DETECTED NONE DETECTED   Benzodiazepines NONE DETECTED NONE DETECTED   Amphetamines NONE DETECTED NONE DETECTED   Tetrahydrocannabinol NONE DETECTED NONE DETECTED   Barbiturates NONE DETECTED NONE DETECTED    Comment:        DRUG SCREEN FOR MEDICAL PURPOSES ONLY.  IF CONFIRMATION IS NEEDED FOR ANY PURPOSE, NOTIFY LAB WITHIN 5 DAYS.        LOWEST DETECTABLE LIMITS FOR URINE DRUG SCREEN Drug Class       Cutoff (ng/mL) Amphetamine      1000 Barbiturate      200 Benzodiazepine   456 Tricyclics       256 Opiates          300 Cocaine          300 THC              50    Labs are reviewed and are pertinent for medical issues being treated .  Current Facility-Administered Medications  Medication Dose Route Frequency Provider Last Rate Last Dose  . acetaminophen (TYLENOL) tablet 650 mg  650 mg Oral Q4H PRN Dot Lanes, MD      . alum & mag hydroxide-simeth (MAALOX/MYLANTA) 200-200-20 MG/5ML suspension 30 mL  30 mL Oral PRN Dot Lanes, MD      . buPROPion [facility]) tablet 75 mg  75 mg Oral BID Viona Gilmore Cartner, PA-C   75 mg at 01/28/14 2141  . hydrochlorothiazide (MICROZIDE) capsule 12.5 mg  12.5 mg Oral Daily Houston Siren III, MD   12.5 mg at 01/28/14 1514  . ibuprofen (ADVIL,MOTRIN) tablet 600 mg  600 mg Oral Q8H PRN Dot Lanes, MD      . lisinopril (PRINIVIL,ZESTRIL) tablet 20 mg  20 mg Oral Daily Houston Siren III, MD   20 mg at 01/28/14 1514  . LORazepam (ATIVAN) tablet 1 mg  1 mg Oral Q8H PRN Dot Lanes, MD   1 mg at 01/29/14 0816  .  metFORMIN (GLUCOPHAGE) tablet 500 mg  500 mg Oral BID WC Viona Gilmore Cartner, PA-C   500 mg at 01/29/14 0809  . ondansetron (ZOFRAN) tablet 4 mg  4 mg Oral Q8H PRN Dot Lanes, MD   4 mg at 01/29/14 0450  . pantoprazole (PROTONIX) EC tablet 40 mg  40 mg Oral Daily Viona Gilmore Cartner, PA-C    40 mg at 01/28/14 1515  . QUEtiapine (SEROQUEL) tablet 25 mg  25 mg Oral QHS Viona Gilmore Cartner, PA-C   25 mg at 01/28/14 2141   Current Outpatient Prescriptions  Medication Sig Dispense Refill  . buPROPion (WELLBUTRIN SR) 150 MG 12 hr tablet Take 150 mg by mouth 2 (two) times daily.    Marland Kitchen gabapentin ([name]) 300 MG capsule Take 2 capsules (600 mg total) by mouth 3 (three) times daily. 180 capsule 0  . lisinopril-hydrochlorothiazide (PRINZIDE,ZESTORETIC) 20-12.5 MG per tablet Take 1 tablet by mouth daily. 30 tablet 4  . metFORMIN (GLUCOPHAGE) 500 MG tablet Take 1 tablet (500 mg total) by mouth 2 (two) times daily with a meal.    . methadone (DOLOPHINE) 10 MG/ML solution Take 155 mg by mouth daily.    Marland Kitchen omeprazole (PRILOSEC) 20 MG capsule Take 1 capsule (20 mg total) by mouth 2 (two) times daily before a meal. ([name] [name] taking differently: Take 20 mg by mouth daily as needed (indigestion/acid reflux). ) 60 capsule 1  . QUEtiapine (SEROQUEL XR) 300 MG 24 hr tablet Take 300 mg by mouth at bedtime.      Psychiatric Specialty Exam:     Blood pressure 117/74, pulse 69, temperature 98 F (36.7 C), temperature source Oral, resp. rate 18, weight 105.688 kg (233 lb), SpO2 100 %.Body mass index is 29.9 kg/(m^2).  General Appearance: Casual  Eye Contact::  Good  Speech:  Clear and Coherent and Normal Rate  Volume:  Normal  Mood:  Depressed  Affect:  Congruent  Thought Process:  Circumstantial, Coherent and Logical  Orientation:  Full (Time, Place, and Person)  Thought Content:  WDL  Suicidal Thoughts:  No  Homicidal Thoughts:  No  Memory:  Immediate;   Good Recent;   Good Remote;   Good  Judgement:  Fair  Insight:  Fair  Psychomotor Activity:  Normal  Concentration:  Good  Recall:  Good  Akathisia:  No  Handed:  Right  AIMS (if indicated):     Assets:  Communication Skills Desire for Improvement Housing Social Support of parents   Sleep:      Treatment Plan Summary: recommend  outpatient managment of mood symptoms.  [name] [name] to follow up at [facility] on January 21st. with Dr. Oletta Lamas.  Pateint to follow up with Methadone clinc for gradual downward titration of methodone dosage and substance abuse and recoveyr needs.     Kennedy Bucker  PMH-NP 01/29/2014 8:56 AM

## 2014-01-29 NOTE — ED Provider Notes (Signed)
Patient denies homicidal or suicidal ideations at this time.  Was seen by the nurse practitioner from psychiatry and cleared for discharge.  Has appointment with [facility].  Stable for discharge.  Dot Lanes, MD 01/29/14 [phone]

## 2014-01-29 NOTE — ED Notes (Signed)
Staffing Office aware to d/c sitter.

## 2014-01-29 NOTE — ED Notes (Signed)
Pt reports, "I take methadone." "I am beginning to feel sick, is there something I can take for it?" "I told Maudie Mercury and she told me to talk with my nurse." Pt noted to have Ativan ordered as needed.

## 2014-02-09 ENCOUNTER — Other Ambulatory Visit: Payer: Self-pay | Admitting: Internal Medicine

## 2014-03-13 ENCOUNTER — Ambulatory Visit: Payer: Self-pay

## 2014-03-16 ENCOUNTER — Encounter (HOSPITAL_COMMUNITY): Payer: Self-pay | Admitting: Emergency Medicine

## 2014-03-16 ENCOUNTER — Emergency Department (HOSPITAL_COMMUNITY)
Admission: EM | Admit: 2014-03-16 | Discharge: 2014-03-16 | Disposition: A | Discharge status: Home / Self Care | Payer: Self-pay | Attending: Emergency Medicine | Admitting: Emergency Medicine

## 2014-03-16 DIAGNOSIS — S91331A Puncture wound without foreign body, right foot, initial encounter: Secondary | ICD-10-CM

## 2014-03-16 DIAGNOSIS — Y929 Unspecified place or not applicable: Secondary | ICD-10-CM | POA: Insufficient documentation

## 2014-03-16 DIAGNOSIS — Z79899 Other long term (current) drug therapy: Secondary | ICD-10-CM | POA: Insufficient documentation

## 2014-03-16 DIAGNOSIS — Z23 Encounter for immunization: Secondary | ICD-10-CM | POA: Insufficient documentation

## 2014-03-16 DIAGNOSIS — W228XXA Striking against or struck by other objects, initial encounter: Secondary | ICD-10-CM | POA: Insufficient documentation

## 2014-03-16 DIAGNOSIS — E119 Type 2 diabetes mellitus without complications: Secondary | ICD-10-CM

## 2014-03-16 DIAGNOSIS — Y990 Civilian activity done for income or pay: Secondary | ICD-10-CM | POA: Insufficient documentation

## 2014-03-16 DIAGNOSIS — Z8669 Personal history of other diseases of the nervous system and sense organs: Secondary | ICD-10-CM | POA: Insufficient documentation

## 2014-03-16 DIAGNOSIS — E1140 Type 2 diabetes mellitus with diabetic neuropathy, unspecified: Secondary | ICD-10-CM | POA: Insufficient documentation

## 2014-03-16 DIAGNOSIS — F3130 Bipolar disorder, current episode depressed, mild or moderate severity, unspecified: Secondary | ICD-10-CM | POA: Insufficient documentation

## 2014-03-16 DIAGNOSIS — I10 Essential (primary) hypertension: Secondary | ICD-10-CM | POA: Insufficient documentation

## 2014-03-16 DIAGNOSIS — Y939 Activity, unspecified: Secondary | ICD-10-CM | POA: Insufficient documentation

## 2014-03-16 HISTORY — DX: Sleep apnea, unspecified: G47.30

## 2014-03-16 MED ORDER — CIPROFLOXACIN HCL 500 MG PO TABS
500.0000 mg | ORAL_TABLET | Freq: Two times a day (BID) | ORAL | Status: DC
Start: 2014-03-16 — End: 2014-05-24

## 2014-03-16 MED ORDER — CEPHALEXIN 500 MG PO CAPS: 500.0000 mg | ORAL_CAPSULE | Freq: Three times a day (TID) | ORAL | Status: DC

## 2014-03-16 MED ORDER — TETANUS-DIPHTH-ACELL PERTUSSIS 5-2.5-18.5 LF-MCG/0.5 IM SUSP
0.5000 mL | Freq: Once | INTRAMUSCULAR | Status: AC
  Administered 2014-03-16: 0.5 mL via INTRAMUSCULAR
  Filled 2014-03-16: qty 0.5

## 2014-03-16 NOTE — ED Notes (Signed)
Pt right foot placed in basin with normal saline and hydrogen peroxide to soak, per PA

## 2014-03-16 NOTE — ED Provider Notes (Signed)
CSN: [id number]     Arrival date & time 03/16/14  1802 History  This chart was scribed for non-physician practitioner, Margarita Mail PA,  working with Blanchie Dessert, MD, by Eustaquio Maize, ED Scribe. This patient was seen in room TR08C/TR08C and the patient's care was started at 6:57 PM.    Chief Complaint  Patient presents with  . Foot Injury    The history is provided by the patient. No language interpreter was used.     HPI Comments: [name] [name] is a 54 y.o. male with history of diabetic neuropathy who presents to the Emergency Department complaining of right foot pain that began earlier today. Pt reports that he noticed a nail sticking through the bottom of his shoe upon taking his shoe off. He complained of a throbbing sensation earlier today but denies any pain at the moment. Pt is unsure if he is UTD on his tetanus shot. He denies any other symptoms at this time.    Past Medical History  Diagnosis Date  . Depressed bipolar disorder   . Hypertension   . Diabetes mellitus without complication   . Sleep apnea    Past Surgical History  Procedure Laterality Date  . Spine surgery  2010  . Shoulder arthroscopy with rotator cuff repair Right 2009   Family History  Problem Relation Age of Onset  . Diabetes Mother   . Hyperlipidemia Mother   . Heart disease Father    History  Substance Use Topics  . Smoking status: Never Smoker   . Smokeless tobacco: Not on file  . Alcohol Use: No    Review of Systems  Musculoskeletal: Positive for gait problem.       Right foot pain and throbbing.   Skin: Positive for wound.  Hematological: Does not bruise/bleed easily.      Allergies  Oxytetracycline and Sulfonamide derivatives  Home Medications   Prior to Admission medications   Medication Sig Start Date End Date Taking? Authorizing Provider  buPROPion (WELLBUTRIN SR) 150 MG 12 hr tablet Take 150 mg by mouth 2 (two) times daily.    Historical Provider, MD  gabapentin  ([name]) 300 MG capsule TAKE 2 CAPSULES BY MOUTH 3 TIMES DAY 02/10/14   Lance Bosch, NP  lisinopril-hydrochlorothiazide (PRINZIDE,ZESTORETIC) 20-12.5 MG per tablet TAKE 1 TABLET BY MOUTH DAILY. 02/10/14   Lance Bosch, NP  metFORMIN (GLUCOPHAGE) 500 MG tablet Take 1 tablet (500 mg total) by mouth 2 (two) times daily with a meal. 01/02/14   Barton Dubois, MD  metFORMIN (GLUCOPHAGE) 500 MG tablet TAKE 1 TABLET BY MOUTH 2 TIMES DAILY WITH A MEAL. 02/10/14   Lance Bosch, NP  methadone (DOLOPHINE) 10 MG/ML solution Take 155 mg by mouth daily.    Historical Provider, MD  omeprazole (PRILOSEC) 20 MG capsule Take 1 capsule (20 mg total) by mouth 2 (two) times daily before a meal. Patient taking differently: Take 20 mg by mouth daily as needed (indigestion/acid reflux).  01/01/14   Barton Dubois, MD  QUEtiapine (SEROQUEL XR) 300 MG 24 hr tablet Take 300 mg by mouth at bedtime.    Historical Provider, MD   Triage Vitals: BP 156/75 mmHg  Pulse 63  Temp(Src) 98 F (36.7 C) (Oral)  Resp 16  Ht 6\' 2"  (1.88 m)  Wt 235 lb (106.595 kg)  BMI 30.16 kg/m2  SpO2 96%   Physical Exam  Constitutional: He is oriented to person, place, and time. He appears well-developed and well-nourished. No distress.  1 cm deep puncture wound to plantar surface of right foot through bottom of shoe; open; Doesn't appear infected at this time.   HENT:  Head: Normocephalic and atraumatic.  Eyes: Conjunctivae and EOM are normal.  Neck: Neck supple. No tracheal deviation present.  Cardiovascular: Normal rate.   Pulmonary/Chest: Effort normal. No respiratory distress.  Musculoskeletal: Normal range of motion.  Neurological: He is alert and oriented to person, place, and time.  Skin: Skin is warm and dry.  Psychiatric: He has a normal mood and affect. His behavior is normal.  Nursing note and vitals reviewed.   ED Course  Procedures (including critical care time) DIAGNOSTIC STUDIES: Oxygen Saturation is 96% on RA,  normal by my interpretation.    COORDINATION OF CARE: 6:59 PM-Discussed treatment plan which includes antibiotics and follow up with [facility] with pt at bedside and pt agreed to plan.   Labs Review Labs Reviewed - No data to display  Imaging Review No results found.   EKG Interpretation None      MDM   Final diagnoses:  Puncture wound of foot, right, initial encounter  Diabetes mellitus without complication    Agent wound cleansed thoroughly in the emergency department. No signs of foreign body. I spoke with the pharmacist. Cipro can interact with morphine to cause QT prolongation. However, review of literature shows that the benefits outweigh the risks. Considering the patient's puncture went through his shoe as well as his history of diabetes. Patient will be discharged with Keflex, Cipro. He has minimal pain. I discussed home wound care and return precautions. Patient is to follow-up in 48 hours for wound recheck.I personally performed the services described in this documentation, which was scribed in my presence. The recorded information has been reviewed and is accurate.       Margarita Mail, PA-C 03/17/14 3212  Blanchie Dessert, MD 03/17/14 [phone]

## 2014-03-16 NOTE — ED Notes (Signed)
Pt sts he was working on an old house today.  Pt has severe diabetic neuropathy. Pt got home tonight and noticed blood and a nail sticking through the bottom of his shoe.  Pt sts he unsure how long the nail was lodge in his foot.

## 2014-03-16 NOTE — Discharge Instructions (Signed)
Puncture Wound °A puncture wound is an injury that extends through all layers of the skin and into the tissue beneath the skin (subcutaneous tissue). Puncture wounds become infected easily because germs often enter the body and go beneath the skin during the injury. Having a deep wound with a small entrance point makes it difficult for your caregiver to adequately clean the wound. This is especially true if you have stepped on a nail and it has passed through a dirty shoe or other situations where the wound is obviously contaminated. °CAUSES  °Many puncture wounds involve glass, nails, splinters, fish hooks, or other objects that enter the skin (foreign bodies). A puncture wound may also be caused by a human bite or animal bite. °DIAGNOSIS  °A puncture wound is usually diagnosed by your history and a physical exam. You may need to have an X-ray or an ultrasound to check for any foreign bodies still in the wound. °TREATMENT  °· Your caregiver will clean the wound as thoroughly as possible. Depending on the location of the wound, a bandage (dressing) may be applied. °· Your caregiver might prescribe antibiotic medicines. °· You may need a follow-up visit to check on your wound. Follow all instructions as directed by your caregiver. °HOME CARE INSTRUCTIONS  °· Change your dressing once per day, or as directed by your caregiver. If the dressing sticks, it may be removed by soaking the area in water. °· If your caregiver has given you follow-up instructions, it is very important that you return for a follow-up appointment. Not following up as directed could result in a chronic or permanent injury, pain, and disability. °· Only take over-the-counter or prescription medicines for pain, discomfort, or fever as directed by your caregiver. °· If you are given antibiotics, take them as directed. Finish them even if you start to feel better. °You may need a tetanus shot if: °· You cannot remember when you had your last tetanus  shot. °· You have never had a tetanus shot. °If you got a tetanus shot, your arm may swell, get red, and feel warm to the touch. This is common and not a problem. If you need a tetanus shot and you choose not to have one, there is a rare chance of getting tetanus. Sickness from tetanus can be serious. °You may need a rabies shot if an animal bite caused your puncture wound. °SEEK MEDICAL CARE IF:  °· You have redness, swelling, or increasing pain in the wound. °· You have red streaks going away from the wound. °· You notice a bad smell coming from the wound or dressing. °· You have yellowish-white fluid (pus) coming from the wound. °· You are treated with an antibiotic for infection, but the infection is not getting better. °· You notice something in the wound, such as rubber from your shoe, cloth, or another object. °· You have a fever. °· You have severe pain. °· You have difficulty breathing. °· You feel dizzy or faint. °· You cannot stop vomiting. °· You lose feeling, develop numbness, or cannot move a limb below the wound. °· Your symptoms worsen. °MAKE SURE YOU: °· Understand these instructions. °· Will watch your condition. °· Will get help right away if you are not doing well or get worse. °Document Released: 10/09/2004 Document Revised: 03/24/2011 Document Reviewed: 06/18/2010 °[employer]® Patient Information ©2015 [employer]. This information is not intended to replace advice given to you by your health care provider. Make sure you discuss any questions you   have with your health care provider. ° °

## 2014-03-28 ENCOUNTER — Encounter (HOSPITAL_COMMUNITY): Payer: Self-pay | Admitting: *Deleted

## 2014-03-28 ENCOUNTER — Emergency Department (INDEPENDENT_AMBULATORY_CARE_PROVIDER_SITE_OTHER)
Admission: EM | Admit: 2014-03-28 | Discharge: 2014-03-28 | Disposition: A | Discharge status: Home / Self Care | Payer: Self-pay | Source: Home / Self Care | Attending: Emergency Medicine | Admitting: Emergency Medicine

## 2014-03-28 DIAGNOSIS — L237 Allergic contact dermatitis due to plants, except food: Secondary | ICD-10-CM

## 2014-03-28 DIAGNOSIS — Z5189 Encounter for other specified aftercare: Secondary | ICD-10-CM

## 2014-03-28 HISTORY — DX: Type 2 diabetes mellitus with diabetic neuropathy, unspecified: E11.40

## 2014-03-28 MED ORDER — TRIAMCINOLONE ACETONIDE 0.1 % EX CREA: 1.0000 "application " | TOPICAL_CREAM | Freq: Two times a day (BID) | CUTANEOUS | Status: DC

## 2014-03-28 NOTE — ED Notes (Signed)
Was taking down a cabin 1 week ago and stepped on nail but did not notice it until several hours later. Has diabetic neuropathy.  He was seen at [facility].  He took 2 kinds of antibiotics and finished them.  He noted a rash on the dorsum of his foot today. No itching but feels a little irritated.  Has a little swelling to ankles but he says that is normal for him and they go down when he lays down.

## 2014-03-28 NOTE — ED Provider Notes (Signed)
CSN: [id number]     Arrival date & time 03/28/14  20 History   First MD Initiated Contact with Patient 03/28/14 1736     Chief Complaint  Patient presents with  . Rash   (Consider location/radiation/quality/duration/timing/severity/associated sxs/prior Treatment) HPI  He is a 54 year old man here for evaluation of rash. He states he noticed a rash on the top of his right foot this morning. It feels a little irritated. He has diabetic neuropathy face not sure if it itches or not. He has been working out in the woods daily for the last several weeks. He has poison ivy on his left upper arm. He had stepped on a nail earlier this month with that right foot. At that time he was seen in the emergency room and received a tetanus booster, Keflex, and Cipro. He denies any redness or drainage from the area.  Past Medical History  Diagnosis Date  . Depressed bipolar disorder   . Hypertension   . Diabetes mellitus without complication   . Sleep apnea   . Diabetic neuropathy    Past Surgical History  Procedure Laterality Date  . Spine surgery  2010  . Shoulder arthroscopy with rotator cuff repair Right 2009   Family History  Problem Relation Age of Onset  . Diabetes Mother   . Hyperlipidemia Mother   . Heart disease Father    History  Substance Use Topics  . Smoking status: Never Smoker   . Smokeless tobacco: Not on file  . Alcohol Use: No    Review of Systems  Constitutional: Negative for fever.  Skin: Positive for rash and wound.    Allergies  Oxytetracycline and Sulfonamide derivatives  Home Medications   Prior to Admission medications   Medication Sig Start Date End Date Taking? Authorizing Provider  buPROPion (WELLBUTRIN SR) 150 MG 12 hr tablet Take 150 mg by mouth 2 (two) times daily.   Yes Historical Provider, MD  gabapentin ([name]) 300 MG capsule TAKE 2 CAPSULES BY MOUTH 3 TIMES DAY 02/10/14  Yes Lance Bosch, NP  lisinopril-hydrochlorothiazide  (PRINZIDE,ZESTORETIC) 20-12.5 MG per tablet TAKE 1 TABLET BY MOUTH DAILY. 02/10/14  Yes Lance Bosch, NP  metFORMIN (GLUCOPHAGE) 500 MG tablet TAKE 1 TABLET BY MOUTH 2 TIMES DAILY WITH A MEAL. 02/10/14  Yes Lance Bosch, NP  methadone (DOLOPHINE) 10 MG/ML solution Take 155 mg by mouth daily.   Yes Historical Provider, MD  omeprazole (PRILOSEC) 20 MG capsule Take 1 capsule (20 mg total) by mouth 2 (two) times daily before a meal. Patient taking differently: Take 20 mg by mouth daily as needed (indigestion/acid reflux).  01/01/14  Yes Barton Dubois, MD  QUEtiapine (SEROQUEL XR) 300 MG 24 hr tablet Take 300 mg by mouth at bedtime.   Yes Historical Provider, MD  cephALEXin (KEFLEX) 500 MG capsule Take 1 capsule (500 mg total) by mouth 3 (three) times daily. 03/16/14   Margarita Mail, PA-C  ciprofloxacin (CIPRO) 500 MG tablet Take 1 tablet (500 mg total) by mouth 2 (two) times daily. One po bid x 7 days 03/16/14   Margarita Mail, PA-C  metFORMIN (GLUCOPHAGE) 500 MG tablet Take 1 tablet (500 mg total) by mouth 2 (two) times daily with a meal. 01/02/14   Barton Dubois, MD  triamcinolone cream (KENALOG) 0.1 % Apply 1 application topically 2 (two) times daily. 03/28/14   Melony Overly, MD   BP 137/73 mmHg  Pulse 63  Temp(Src) 97.9 F (36.6 C) (Oral)  Resp 20  SpO2 96% Physical Exam  Constitutional: He is oriented to person, place, and time. He appears well-developed and well-nourished. No distress.  Cardiovascular: Normal rate.   Pulmonary/Chest: Effort normal.  Neurological: He is alert and oriented to person, place, and time.  Skin:  Erythematous maculopapular rash over right dorsal foot. He has a similar rash on the left upper arm. Puncture wound on right plantar foot has healed well.    ED Course  Procedures (including critical care time) Labs Review Labs Reviewed - No data to display  Imaging Review No results found.   MDM   1. Poison ivy dermatitis   2. Visit for wound check     Triamcinolone cream for poison ivy. Puncture wound has healed well. Follow-up as needed.   Melony Overly, MD 03/28/14 [phone]

## 2014-03-28 NOTE — Discharge Instructions (Signed)
You have poison ivy on her foot. Use the triamcinolone cream twice a day for the next week. If the rash is spreading or you develop fevers please come back.  The wound from the nail has healed well.  Please follow-up with your PCP to discuss treatment of the toenail fungus.

## 2014-04-05 ENCOUNTER — Ambulatory Visit: Payer: Self-pay

## 2014-04-05 ENCOUNTER — Other Ambulatory Visit: Payer: Self-pay | Admitting: Internal Medicine

## 2014-04-26 ENCOUNTER — Telehealth: Payer: Self-pay

## 2014-04-26 ENCOUNTER — Other Ambulatory Visit: Payer: Self-pay | Admitting: Internal Medicine

## 2014-04-26 NOTE — Telephone Encounter (Signed)
Spoke with patient and he is aware we will not refill his gabapentin Patient needs to make an appointment to be seen by his primary doctor

## 2014-04-26 NOTE — Telephone Encounter (Signed)
Patient is requesting a refill on his gabapentin Patient is going out of town and is afraid he will run out Can this be refilled

## 2014-04-26 NOTE — Telephone Encounter (Signed)
Patient has not been seen here since July last year.

## 2014-05-02 ENCOUNTER — Ambulatory Visit: Payer: Self-pay

## 2014-05-04 ENCOUNTER — Other Ambulatory Visit: Payer: Self-pay | Admitting: Internal Medicine

## 2014-05-04 DIAGNOSIS — E119 Type 2 diabetes mellitus without complications: Secondary | ICD-10-CM

## 2014-05-04 MED ORDER — FREESTYLE LANCETS MISC: Status: DC

## 2014-05-04 MED ORDER — GLUCOSE BLOOD VI STRP: ORAL_STRIP | Status: DC

## 2014-05-04 MED ORDER — FREESTYLE SYSTEM KIT: 1.0000 | PACK | Status: DC | PRN

## 2014-05-24 ENCOUNTER — Encounter: Payer: Self-pay | Admitting: Internal Medicine

## 2014-05-24 ENCOUNTER — Ambulatory Visit: Payer: Self-pay | Attending: Internal Medicine | Admitting: Internal Medicine

## 2014-05-24 ENCOUNTER — Telehealth: Payer: Self-pay | Admitting: Internal Medicine

## 2014-05-24 VITALS — BP 143/85 | HR 70 | Temp 99.1°F | Resp 16 | Ht 73.0 in | Wt 240.0 lb

## 2014-05-24 DIAGNOSIS — Z23 Encounter for immunization: Secondary | ICD-10-CM

## 2014-05-24 DIAGNOSIS — B351 Tinea unguium: Secondary | ICD-10-CM | POA: Insufficient documentation

## 2014-05-24 DIAGNOSIS — I10 Essential (primary) hypertension: Secondary | ICD-10-CM | POA: Insufficient documentation

## 2014-05-24 DIAGNOSIS — E1142 Type 2 diabetes mellitus with diabetic polyneuropathy: Secondary | ICD-10-CM | POA: Insufficient documentation

## 2014-05-24 DIAGNOSIS — K047 Periapical abscess without sinus: Secondary | ICD-10-CM | POA: Insufficient documentation

## 2014-05-24 LAB — POCT GLYCOSYLATED HEMOGLOBIN (HGB A1C): Hemoglobin A1C: 8.1

## 2014-05-24 LAB — GLUCOSE, POCT (MANUAL RESULT ENTRY): POC Glucose: 174 mg/dl — AB (ref 70–99)

## 2014-05-24 MED ORDER — METFORMIN HCL 500 MG PO TABS: 500.0000 mg | ORAL_TABLET | Freq: Two times a day (BID) | ORAL | Status: DC

## 2014-05-24 MED ORDER — LISINOPRIL-HYDROCHLOROTHIAZIDE 20-12.5 MG PO TABS
1.0000 | ORAL_TABLET | Freq: Every day | ORAL | Status: DC
Start: 2014-05-24 — End: 2014-10-17

## 2014-05-24 MED ORDER — FREESTYLE SYSTEM KIT: 1.0000 | PACK | Status: DC | PRN

## 2014-05-24 MED ORDER — FREESTYLE LANCETS MISC: Status: DC

## 2014-05-24 MED ORDER — TERBINAFINE HCL 1 % EX CREA: 1.0000 "application " | TOPICAL_CREAM | Freq: Two times a day (BID) | CUTANEOUS | Status: DC

## 2014-05-24 MED ORDER — GABAPENTIN 600 MG PO TABS: ORAL_TABLET | ORAL | Status: DC

## 2014-05-24 MED ORDER — GLUCOSE BLOOD VI STRP: ORAL_STRIP | Status: DC

## 2014-05-24 NOTE — Progress Notes (Signed)
Pt is here following up on his HTN and diabetes. Pt states that he had to make another appointment here so he can refill his gabapentin.

## 2014-05-24 NOTE — Telephone Encounter (Signed)
Patient had an appointment today and forgot to mention that he has an abscess tooth and is needing antibiotics, please f/u with patient

## 2014-05-24 NOTE — Progress Notes (Signed)
Patient ID: [name] [name], male   DOB: [birth year], 54 y.o.   MRN: [id number]  CC: neuropathy  HPI: [name] [name] is a 54 y.o. male here today for a follow up visit.  Patient has past medical history of HTN, sleep apnea, diabetic neuropathy, and bipolar disorder. Patient has not had a follow up visit in one year but reports that he has continued to take his medications. He missed his BP medication this morning and notes that he has been skipping doses. He states that takes his Metformin daily but unable to eat healthy due to financial concerns. He also notes that he is not exercising as much due to his depression. He notes that he had a nerve conduction study in the past but does not remember the results.  Left sided facial swelling and pain for the past 2 weeks. 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 Unable to sleep due to pain last night.   Patient has No headache, No chest pain, No abdominal pain - No Nausea, No new weakness tingling or numbness, No Cough - SOB.  Allergies  Allergen Reactions  . Oxytetracycline Rash  . Sulfonamide Derivatives Rash   Past Medical History  Diagnosis Date  . Depressed bipolar disorder   . Hypertension   . Diabetes mellitus without complication   . Sleep apnea   . Diabetic neuropathy    Current Outpatient Prescriptions on File Prior to Visit  Medication Sig Dispense Refill  . buPROPion (WELLBUTRIN SR) 150 MG 12 hr tablet Take 150 mg by mouth 2 (two) times daily.    Marland Kitchen gabapentin ([name]) 600 MG tablet TAKE 1 TABLET MOUTH 3 TIMES DAY 90 tablet 0  . lisinopril-hydrochlorothiazide (PRINZIDE,ZESTORETIC) 20-12.5 MG per tablet TAKE 1 TABLET BY MOUTH DAILY. 30 tablet 4  . methadone (DOLOPHINE) 10 MG/ML solution Take 155 mg by mouth daily.    Marland Kitchen omeprazole (PRILOSEC) 20 MG capsule Take 1 capsule (20 mg total) by mouth 2 (two) times daily before a meal. (Patient taking differently: Take 20 mg by  mouth daily as needed (indigestion/acid reflux). ) 60 capsule 1  . QUEtiapine (SEROQUEL XR) 300 MG 24 hr tablet Take 300 mg by mouth at bedtime.    . cephALEXin (KEFLEX) 500 MG capsule Take 1 capsule (500 mg total) by mouth 3 (three) times daily. (Patient not taking: Reported on 05/24/2014) 21 capsule 0  . glucose blood test strip Use as instructed (Patient not taking: Reported on 05/24/2014) 100 each 12  . glucose monitoring kit (FREESTYLE) monitoring kit 1 each by Does not apply route as needed. (Patient not taking: Reported on 05/24/2014) 1 each 0  . Lancets (FREESTYLE) lancets Use as instructed (Patient not taking: Reported on 05/24/2014) 100 each 12   No current facility-administered medications on file prior to visit.   Family History  Problem Relation Age of Onset  . Diabetes Mother   . Hyperlipidemia Mother   . Heart disease Father    History   Social History  . Marital Status: Single    Spouse Name: N/A  . Number of Children: N/A  . Years of Education: N/A   Occupational History  . Not on file.   Social History Main Topics  . Smoking status: Never Smoker   . Smokeless tobacco: Not on file  . Alcohol Use: No  . Drug Use: No  . Sexual Activity: Not on file   Other Topics Concern  . Not on  file   Social History Narrative    Review of Systems: See HPI   Objective:   Filed Vitals:   05/24/14 1204  BP: 143/85  Pulse: 70  Temp: 99.1 F (37.3 C)  Resp: 16    Physical Exam  HENT:  Right Ear: External ear normal.  Left Ear: External ear normal.  Left side facial swelling. No redness or warmth to site  Eyes: EOM are normal. Pupils are equal, round, and reactive to light.  Cardiovascular: Normal rate, regular rhythm and normal heart sounds.   Pulses:      Dorsalis pedis pulses are 2+ on the right side, and 2+ on the left side.       Posterior tibial pulses are 2+ on the right side, and 2+ on the left side.  Pulmonary/Chest: Effort normal and breath sounds  normal.  Feet:  Right Foot:  Skin Integrity: Positive for dry skin. Negative for skin breakdown.  Left Foot:  Skin Integrity: Positive for dry skin. Negative for skin breakdown.  Lymphadenopathy:    He has no cervical adenopathy.     Lab Results  Component Value Date   WBC 6.3 01/28/2014   HGB 13.1 01/28/2014   HCT 36.4* 01/28/2014   MCV 78.4 01/28/2014   PLT 205 01/28/2014   Lab Results  Component Value Date   CREATININE 1.02 01/28/2014   BUN 16 01/28/2014   NA 134* 01/28/2014   K 4.0 01/28/2014   CL 96 01/28/2014   CO2 25 01/28/2014    Lab Results  Component Value Date   HGBA1C 8.10 05/24/2014   Lipid Panel     Component Value Date/Time   CHOL 151 01/01/2014 0337   TRIG 196* 01/01/2014 0337   HDL 34* 01/01/2014 0337   CHOLHDL 4.4 01/01/2014 0337   VLDL 39 01/01/2014 0337   LDLCALC 78 01/01/2014 0337       Assessment and plan:   [name] [name] was seen today for follow-up.  Diagnoses and all orders for this visit:  Type 2 diabetes mellitus with diabetic polyneuropathy Orders: -     Glucose (CBG) -     HgB A1c -     Microalbumin, urine; Future -    Refill gabapentin ([name]) 600 MG tablet; TAKE 1 TABLET MOUTH 3 TIMES DAY -     Refill metFORMIN (GLUCOPHAGE) 500 MG tablet; Take 1 tablet (500 mg total) by mouth 2 (two) times daily with a meal. -     glucose monitoring kit (FREESTYLE) monitoring kit; 1 each by Does not apply route as needed. -     Lancets (FREESTYLE) lancets; Use as instructed -     glucose blood test strip; Use as instructed -     Pneumococcal polysaccharide vaccine 23-valent greater than or equal to 2yo subcutaneous/IM Last bmet was WNL. Increased A1C for 7.0 to 8.1%. Stressed medication and diet compliance with patient.   Essential hypertension Orders: -    Refill lisinopril-hydrochlorothiazide (PRINZIDE,ZESTORETIC) 20-12.5 MG per tablet; Take 1 tablet by mouth daily. Needs further observation of BP control.   Onychomycosis Orders: -      terbinafine (LAMISIL AT) 1 % cream; Apply 1 application topically 2 (two) times daily.  Dental infection Antibiotic sent, I have given patient list of low cost dental referrals  Explained signs and symptoms that should warrant immediate attention.  Patient verbalized understanding with teach back used.     Return in about 3 months (around 08/24/2014) for DM/HTN.   Chari Manning, NP-C [facility] [phone] 05/24/2014, 12:23 PM

## 2014-05-25 NOTE — Telephone Encounter (Signed)
Please send amoxicillin 500 mg TID for 10 days. Thanks

## 2014-06-09 DIAGNOSIS — E1142 Type 2 diabetes mellitus with diabetic polyneuropathy: Secondary | ICD-10-CM | POA: Insufficient documentation

## 2014-06-16 ENCOUNTER — Emergency Department (HOSPITAL_COMMUNITY)
Admission: EM | Admit: 2014-06-16 | Discharge: 2014-06-16 | Disposition: A | Discharge status: Home / Self Care | Payer: Self-pay | Attending: Emergency Medicine | Admitting: Emergency Medicine

## 2014-06-16 ENCOUNTER — Encounter (HOSPITAL_COMMUNITY): Payer: Self-pay | Admitting: *Deleted

## 2014-06-16 DIAGNOSIS — Z79899 Other long term (current) drug therapy: Secondary | ICD-10-CM | POA: Insufficient documentation

## 2014-06-16 DIAGNOSIS — F329 Major depressive disorder, single episode, unspecified: Secondary | ICD-10-CM | POA: Insufficient documentation

## 2014-06-16 DIAGNOSIS — G629 Polyneuropathy, unspecified: Secondary | ICD-10-CM

## 2014-06-16 DIAGNOSIS — Z8669 Personal history of other diseases of the nervous system and sense organs: Secondary | ICD-10-CM | POA: Insufficient documentation

## 2014-06-16 DIAGNOSIS — I10 Essential (primary) hypertension: Secondary | ICD-10-CM | POA: Insufficient documentation

## 2014-06-16 DIAGNOSIS — E1140 Type 2 diabetes mellitus with diabetic neuropathy, unspecified: Secondary | ICD-10-CM | POA: Insufficient documentation

## 2014-06-16 MED ORDER — CAPSAICIN 0.075 % EX CREA
TOPICAL_CREAM | Freq: Two times a day (BID) | CUTANEOUS | Status: DC
  Filled 2014-06-16: qty 60

## 2014-06-16 NOTE — Discharge Instructions (Signed)
Neuropathic Pain We often think that pain has a physical cause. If we get rid of the cause, the pain should go away. Nerves themselves can also cause pain. It is called neuropathic pain, which means nerve abnormality. It may be difficult for the patients who have it and for the treating caregivers. Pain is usually described as acute (short-lived) or chronic (long-lasting). Acute pain is related to the physical sensations caused by an injury. It can last from a few seconds to many weeks, but it usually goes away when normal healing occurs. Chronic pain lasts beyond the typical healing time. With neuropathic pain, the nerve fibers themselves may be damaged or injured. They then send incorrect signals to other pain centers. The pain you feel is real, but the cause is not easy to find.  CAUSES  Chronic pain can result from diseases, such as diabetes and shingles (an infection related to chickenpox), or from trauma, surgery, or amputation. It can also happen without any known injury or disease. The nerves are sending pain messages, even though there is no identifiable cause for such messages.   Other common causes of neuropathy include diabetes, phantom limb pain, or Regional Pain Syndrome (RPS).  As with all forms of chronic back pain, if neuropathy is not correctly treated, there can be a number of associated problems that lead to a downward cycle for the patient. These include depression, sleeplessness, feelings of fear and anxiety, limited social interaction and inability to do normal daily activities or work.  The most dramatic and mysterious example of neuropathic pain is called "phantom limb syndrome." This occurs when an arm or a leg has been removed because of illness or injury. The brain still gets pain messages from the nerves that originally carried impulses from the missing limb. These nerves now seem to misfire and cause troubling pain.  Neuropathic pain often seems to have no cause. It responds  poorly to standard pain treatment. Neuropathic pain can occur after:  Shingles (herpes zoster virus infection).  A lasting burning sensation of the skin, caused usually by injury to a peripheral nerve.  Peripheral neuropathy which is widespread nerve damage, often caused by diabetes or alcoholism.  Phantom limb pain following an amputation.  Facial nerve problems (trigeminal neuralgia).  Multiple sclerosis.  Reflex sympathetic dystrophy.  Pain which comes with cancer and cancer chemotherapy.  Entrapment neuropathy such as when pressure is put on a nerve such as in carpal tunnel syndrome.  Back, leg, and hip problems (sciatica).  Spine or back surgery.  HIV Infection or AIDS where nerves are infected by viruses. Your caregiver can explain items in the above list which may apply to you. SYMPTOMS  Characteristics of neuropathic pain are:  Severe, sharp, electric shock-like, shooting, lightening-like, knife-like.  Pins and needles sensation.  Deep burning, deep cold, or deep ache.  Persistent numbness, tingling, or weakness.  Pain resulting from light touch or other stimulus that would not usually cause pain.  Increased sensitivity to something that would normally cause pain, such as a pinprick. Pain may persist for months or years following the healing of damaged tissues. When this happens, pain signals no longer sound an alarm about current injuries or injuries about to happen. Instead, the alarm system itself is not working correctly.  Neuropathic pain may get worse instead of better over time. For some people, it can lead to serious disability. It is important to be aware that severe injury in a limb can occur without a proper, protective pain  response.Burns, cuts, and other injuries may go unnoticed. Without proper treatment, these injuries can become infected or lead to further disability. Take any injury seriously, and consult your caregiver for treatment. DIAGNOSIS    When you have a pain with no known cause, your caregiver will probably ask some specific questions:   Do you have any other conditions, such as diabetes, shingles, multiple sclerosis, or HIV infection?  How would you describe your pain? (Neuropathic pain is often described as shooting, stabbing, burning, or searing.)  Is your pain worse at any time of the day? (Neuropathic pain is usually worse at night.)  Does the pain seem to follow a certain physical pathway?  Does the pain come from an area that has missing or injured nerves? (An example would be phantom limb pain.)  Is the pain triggered by minor things such as rubbing against the sheets at night? These questions often help define the type of pain involved. Once your caregiver knows what is happening, treatment can begin. Anticonvulsant, antidepressant drugs, and various pain relievers seem to work in some cases. If another condition, such as diabetes is involved, better management of that disorder may relieve the neuropathic pain.  TREATMENT  Neuropathic pain is frequently long-lasting and tends not to respond to treatment with narcotic type pain medication. It may respond well to other drugs such as antiseizure and antidepressant medications. Usually, neuropathic problems do not completely go away, but partial improvement is often possible with proper treatment. Your caregivers have large numbers of medications available to treat you. Do not be discouraged if you do not get immediate relief. Sometimes different medications or a combination of medications will be tried before you receive the results you are hoping for. See your caregiver if you have pain that seems to be coming from nowhere and does not go away. Help is available.  SEEK IMMEDIATE MEDICAL CARE IF:   There is a sudden change in the quality of your pain, especially if the change is on only one side of the body.  You notice changes of the skin, such as redness, black or  purple discoloration, swelling, or an ulcer.  You cannot move the affected limbs. Document Released: 09/27/2003 Document Revised: 03/24/2011 Document Reviewed: 09/27/2003 [facility] Patient Information 2015 [place], [place]. This information is not intended to replace advice given to you by your health care provider. Make sure you discuss any questions you have with your health care provider.  Peripheral Neuropathy Peripheral neuropathy is a type of nerve damage. It affects nerves that carry signals between the spinal cord and other parts of the body. These are called peripheral nerves. With peripheral neuropathy, one nerve or a group of nerves may be damaged.  CAUSES  Many things can damage peripheral nerves. For some people with peripheral neuropathy, the cause is unknown. Some causes include:  Diabetes. This is the most common cause of peripheral neuropathy.  Injury to a nerve.  Pressure or stress on a nerve that lasts a long time.  Too little vitamin B. Alcoholism can lead to this.  Infections.  Autoimmune diseases, such as multiple sclerosis and systemic lupus erythematosus.  Inherited nerve diseases.  Some medicines, such as cancer drugs.  Toxic substances, such as lead and mercury.  Too little blood flowing to the legs.  Kidney disease.  Thyroid disease. SIGNS AND SYMPTOMS  Different people have different symptoms. The symptoms you have will depend on which of your nerves is damaged. Common symptoms include:  Loss of feeling (numbness)  in the feet and hands.  Tingling in the feet and hands.  Pain that burns.  Very sensitive skin.  Weakness.  Not being able to move a part of the body (paralysis).  Muscle twitching.  Clumsiness or poor coordination.  Loss of balance.  Not being able to control your bladder.  Feeling dizzy.  Sexual problems. DIAGNOSIS  Peripheral neuropathy is a symptom, not a disease. Finding the cause of peripheral neuropathy can be  hard. To figure that out, your health care provider will take a medical history and do a physical exam. A neurological exam will also be done. This involves checking things affected by your brain, spinal cord, and nerves (nervous system). For example, your health care provider will check your reflexes, how you move, and what you can feel.  Other types of tests may also be ordered, such as:  Blood tests.  A test of the fluid in your spinal cord.  Imaging tests, such as CT scans or an MRI.  Electromyography (EMG). This test checks the nerves that control muscles.  Nerve conduction velocity tests. These tests check how fast messages pass through your nerves.  Nerve biopsy. A small piece of nerve is removed. It is then checked under a microscope. TREATMENT   Medicine is often used to treat peripheral neuropathy. Medicines may include:  Pain-relieving medicines. Prescription or over-the-counter medicine may be suggested.  Antiseizure medicine. This may be used for pain.  Antidepressants. These also may help ease pain from neuropathy.  Lidocaine. This is a numbing medicine. You might wear a patch or be given a shot.  Mexiletine. This medicine is typically used to help control irregular heart rhythms.  Surgery. Surgery may be needed to relieve pressure on a nerve or to destroy a nerve that is causing pain.  Physical therapy to help movement.  Assistive devices to help movement. HOME CARE INSTRUCTIONS   Only take over-the-counter or prescription medicines as directed by your health care provider. Follow the instructions carefully for any given medicines. Do not take any other medicines without first getting approval from your health care provider.  If you have diabetes, work closely with your health care provider to keep your blood sugar under control.  If you have numbness in your feet:  Check every day for signs of injury or infection. Watch for redness, warmth, and  swelling.  Wear padded socks and comfortable shoes. These help protect your feet.  Do not do things that put pressure on your damaged nerve.  Do not smoke. Smoking keeps blood from getting to damaged nerves.  Avoid or limit alcohol. Too much alcohol can cause a lack of B vitamins. These vitamins are needed for healthy nerves.  Develop a good support system. Coping with peripheral neuropathy can be stressful. Talk to a mental health specialist or join a support group if you are struggling.  Follow up with your health care provider as directed. SEEK MEDICAL CARE IF:   You have new signs or symptoms of peripheral neuropathy.  You are struggling emotionally from dealing with peripheral neuropathy.  You have a fever. SEEK IMMEDIATE MEDICAL CARE IF:   You have an injury or infection that is not healing.  You feel very dizzy or begin vomiting.  You have chest pain.  You have trouble breathing. Document Released: 12/20/2001 Document Revised: 09/11/2010 Document Reviewed: 09/06/2012 [facility] Patient Information 2015 [place], [place]. This information is not intended to replace advice given to you by your health care provider. Make sure you  discuss any questions you have with your health care provider. ° °

## 2014-06-16 NOTE — ED Provider Notes (Signed)
CSN: [id number]     Arrival date & time 06/16/14  0238 History   First MD Initiated Contact with Patient 06/16/14 0429     No chief complaint on file.    (Consider location/radiation/quality/duration/timing/severity/associated sxs/prior Treatment) HPI 54-year-old male presents to emergency department with complaint of worsening of his diabetic neuropathy.  Over the last week.  He reports that his [name] is no longer working.  He [facility], but wasn't able to make an appointment.  He went to an old clinic that he used to attend, and the nurse practitioner there, would not give him Lyrica as he was are taking [name].  Patient reports that he has not been able to sleep for several days secondary to pain.  Patient has past history of overdose with methadone. Past Medical History  Diagnosis Date  . Depressed bipolar disorder   . Hypertension   . Diabetes mellitus without complication   . Sleep apnea   . Diabetic neuropathy    Past Surgical History  Procedure Laterality Date  . Spine surgery  2010  . Shoulder arthroscopy with rotator cuff repair Right 2009   Family History  Problem Relation Age of Onset  . Diabetes Mother   . Hyperlipidemia Mother   . Heart disease Father    History  Substance Use Topics  . Smoking status: Never Smoker   . Smokeless tobacco: Not on file  . Alcohol Use: No    Review of Systems  See History of Present Illness; otherwise all other systems are reviewed and negative   Allergies  Oxytetracycline and Sulfonamide derivatives  Home Medications   Prior to Admission medications   Medication Sig Start Date End Date Taking? Authorizing Provider  buPROPion (WELLBUTRIN SR) 150 MG 12 hr tablet Take 150 mg by mouth 2 (two) times daily.   Yes Historical Provider, MD  gabapentin ([name]) 600 MG tablet TAKE 1 TABLET MOUTH 3 TIMES DAY 05/24/14  Yes Ambrose Finland, NP  glucose blood test strip  Use as instructed 05/24/14  Yes Ambrose Finland, NP  glucose monitoring kit (FREESTYLE) monitoring kit 1 each by Does not apply route as needed. 05/24/14  Yes Ambrose Finland, NP  Lancets (FREESTYLE) lancets Use as instructed 05/24/14  Yes Ambrose Finland, NP  lisinopril-hydrochlorothiazide (PRINZIDE,ZESTORETIC) 20-12.5 MG per tablet Take 1 tablet by mouth daily. 05/24/14  Yes Ambrose Finland, NP  metFORMIN (GLUCOPHAGE) 500 MG tablet Take 1 tablet (500 mg total) by mouth 2 (two) times daily with a meal. 05/24/14  Yes Ambrose Finland, NP  methadone (DOLOPHINE) 10 MG/ML solution Take 155 mg by mouth daily.   Yes Historical Provider, MD  omeprazole (PRILOSEC) 20 MG capsule Take 1 capsule (20 mg total) by mouth 2 (two) times daily before a meal. Patient taking differently: Take 20 mg by mouth daily as needed (indigestion/acid reflux).  01/01/14  Yes Vassie Loll, MD  QUEtiapine (SEROQUEL XR) 300 MG 24 hr tablet Take 300 mg by mouth at bedtime.   Yes Historical Provider, MD  terbinafine (LAMISIL AT) 1 % cream Apply 1 application topically 2 (two) times daily. 05/24/14  Yes Ambrose Finland, NP  cephALEXin (KEFLEX) 500 MG capsule Take 1 capsule (500 mg total) by mouth 3 (three) times daily. Patient not taking: Reported on 05/24/2014 03/16/14   Arthor Captain, PA-C   BP 142/56 mmHg  Pulse 50  Temp(Src) 98.4 F (36.9 C) (Oral)  Resp 18  SpO2 99% Physical Exam  Constitutional: He is oriented to person, place, and time. He appears well-developed and well-nourished.  HENT:  Head: Normocephalic and atraumatic.  Nose: Nose normal.  Mouth/Throat: Oropharynx is clear and moist.  Eyes: Conjunctivae and EOM are normal. Pupils are equal, round, and reactive to light.  Neck: Normal range of motion. Neck supple. No JVD present. No tracheal deviation present. No thyromegaly present.  Cardiovascular: Normal rate, regular rhythm, normal heart sounds and intact distal pulses.  Exam reveals no gallop and no friction rub.   No  murmur heard. Pulmonary/Chest: Effort normal and breath sounds normal. No stridor. No respiratory distress. He has no wheezes. He has no rales. He exhibits no tenderness.  Abdominal: Soft. Bowel sounds are normal. He exhibits no distension and no mass. There is no tenderness. There is no rebound and no guarding.  Musculoskeletal: Normal range of motion. He exhibits tenderness (patient has tenderness with light touch of hands and feet). He exhibits no edema.  Lymphadenopathy:    He has no cervical adenopathy.  Neurological: He is alert and oriented to person, place, and time. He displays normal reflexes. He exhibits normal muscle tone. Coordination normal.  Skin: Skin is warm and dry. No rash noted. No erythema. No pallor.  Psychiatric: He has a normal mood and affect. His behavior is normal. Judgment and thought content normal.  Nursing note and vitals reviewed.   ED Course  Procedures (including critical care time) Labs Review Labs Reviewed  CBG MONITORING, ED    Imaging Review No results found.   EKG Interpretation None      MDM   Final diagnoses:  Peripheral neuropathy   54 year old male with peripheral neuropathy.  Plan is to treat with capsaicin cream, as I am also uncomfortable treating with Lyrica as is.  He is also on Wellbutrin, Seroquel and [name].  I discussed this with the patient.  We'll check a blood sugar as he reports that it is been running high.  I will [facility] a [facility] to [facility] to see if we can work him into the clinic today or Monday.   Patient left without treatment.  [facility].  He refuses intervention here.  Linton Flemings, MD 06/16/14 [phone]

## 2014-06-16 NOTE — ED Notes (Signed)
Pt c/o worsening neuropathy in hands and feet over the past week. States he takes gabapentin TID (prescribed by [employer]) and normally it works. Pt states that he used to use lyrica and it worked but his MD ([facility] in [place]) would not prescribe Lyrica because he takes gabapentin. States he cannot sleep because the pain is so bad.

## 2014-08-02 ENCOUNTER — Emergency Department (INDEPENDENT_AMBULATORY_CARE_PROVIDER_SITE_OTHER)
Admission: EM | Admit: 2014-08-02 | Discharge: 2014-08-02 | Disposition: A | Discharge status: Home / Self Care | Payer: Self-pay | Source: Home / Self Care | Attending: Family Medicine | Admitting: Family Medicine

## 2014-08-02 ENCOUNTER — Encounter (HOSPITAL_COMMUNITY): Payer: Self-pay | Admitting: Emergency Medicine

## 2014-08-02 DIAGNOSIS — M791 Myalgia: Secondary | ICD-10-CM

## 2014-08-02 DIAGNOSIS — M7918 Myalgia, other site: Secondary | ICD-10-CM

## 2014-08-02 MED ORDER — KETOROLAC TROMETHAMINE 60 MG/2ML IM SOLN
INTRAMUSCULAR | Status: AC
  Filled 2014-08-02: qty 2

## 2014-08-02 MED ORDER — DICLOFENAC SODIUM 75 MG PO TBEC: 75.0000 mg | DELAYED_RELEASE_TABLET | Freq: Two times a day (BID) | ORAL | Status: DC

## 2014-08-02 MED ORDER — KETOROLAC TROMETHAMINE 60 MG/2ML IM SOLN
60.0000 mg | Freq: Once | INTRAMUSCULAR | Status: AC
  Administered 2014-08-02: 60 mg via INTRAMUSCULAR

## 2014-08-02 MED ORDER — METHOCARBAMOL 500 MG PO TABS: 500.0000 mg | ORAL_TABLET | Freq: Four times a day (QID) | ORAL | Status: DC | PRN
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
## 2014-08-14 ENCOUNTER — Ambulatory Visit: Payer: Self-pay | Admitting: Internal Medicine

## 2014-08-17 ENCOUNTER — Ambulatory Visit: Payer: Self-pay | Admitting: Internal Medicine

## 2014-10-02 ENCOUNTER — Other Ambulatory Visit: Payer: Self-pay | Admitting: Internal Medicine

## 2014-10-03 ENCOUNTER — Telehealth: Payer: Self-pay

## 2014-10-03 NOTE — Telephone Encounter (Signed)
Patient calls asking for Gabapentin refill. Patient is currently completely out of medicine. Please f/u with patient ASAP.

## 2014-10-03 NOTE — Telephone Encounter (Signed)
Spoke with patient and he is aware [facility]

## 2014-10-06 ENCOUNTER — Ambulatory Visit: Payer: Self-pay | Admitting: Internal Medicine

## 2014-10-17 ENCOUNTER — Encounter: Payer: Self-pay | Admitting: Internal Medicine

## 2014-10-17 ENCOUNTER — Ambulatory Visit: Payer: Self-pay | Attending: Internal Medicine | Admitting: Internal Medicine

## 2014-10-17 VITALS — BP 144/92 | HR 60 | Temp 97.8°F | Resp 16 | Ht 73.0 in | Wt 232.8 lb

## 2014-10-17 DIAGNOSIS — B351 Tinea unguium: Secondary | ICD-10-CM

## 2014-10-17 DIAGNOSIS — L089 Local infection of the skin and subcutaneous tissue, unspecified: Secondary | ICD-10-CM

## 2014-10-17 DIAGNOSIS — I10 Essential (primary) hypertension: Secondary | ICD-10-CM

## 2014-10-17 DIAGNOSIS — L237 Allergic contact dermatitis due to plants, except food: Secondary | ICD-10-CM

## 2014-10-17 DIAGNOSIS — Z7984 Long term (current) use of oral hypoglycemic drugs: Secondary | ICD-10-CM | POA: Insufficient documentation

## 2014-10-17 DIAGNOSIS — E1142 Type 2 diabetes mellitus with diabetic polyneuropathy: Secondary | ICD-10-CM

## 2014-10-17 DIAGNOSIS — Z79899 Other long term (current) drug therapy: Secondary | ICD-10-CM | POA: Insufficient documentation

## 2014-10-17 DIAGNOSIS — K0889 Other specified disorders of teeth and supporting structures: Secondary | ICD-10-CM | POA: Insufficient documentation

## 2014-10-17 LAB — BASIC METABOLIC PANEL
BUN: 13 mg/dL (ref 7–25)
CO2: 29 mmol/L (ref 20–31)
Calcium: 8.8 mg/dL (ref 8.6–10.3)
Chloride: 99 mmol/L (ref 98–110)
Creat: 0.86 mg/dL (ref 0.70–1.33)
Glucose, Bld: 189 mg/dL — ABNORMAL HIGH (ref 65–99)
Potassium: 4.2 mmol/L (ref 3.5–5.3)
Sodium: 135 mmol/L (ref 135–146)

## 2014-10-17 LAB — GLUCOSE, POCT (MANUAL RESULT ENTRY): POC Glucose: 246 mg/dl — AB (ref 70–99)

## 2014-10-17 LAB — POCT GLYCOSYLATED HEMOGLOBIN (HGB A1C): Hemoglobin A1C: 7.5

## 2014-10-17 MED ORDER — LISINOPRIL-HYDROCHLOROTHIAZIDE 20-12.5 MG PO TABS: 1.0000 | ORAL_TABLET | Freq: Every day | ORAL | Status: DC

## 2014-10-17 MED ORDER — METFORMIN HCL 500 MG PO TABS: 500.0000 mg | ORAL_TABLET | Freq: Two times a day (BID) | ORAL | Status: DC

## 2014-10-17 MED ORDER — CEPHALEXIN 500 MG PO CAPS: 500.0000 mg | ORAL_CAPSULE | Freq: Two times a day (BID) | ORAL | Status: DC

## 2014-10-17 MED ORDER — CLOBETASOL PROPIONATE 0.05 % EX GEL: CUTANEOUS | Status: DC

## 2014-10-17 MED ORDER — CLOBETASOL PROPIONATE 0.05 % EX CREA: 1.0000 "application " | TOPICAL_CREAM | Freq: Two times a day (BID) | CUTANEOUS | Status: DC

## 2014-10-17 MED ORDER — TERBINAFINE HCL 1 % EX CREA: 1.0000 "application " | TOPICAL_CREAM | Freq: Two times a day (BID) | CUTANEOUS | Status: DC

## 2014-10-17 NOTE — Patient Instructions (Signed)
I have sent clobetasol for your poison IVY  Lamisil/Terbinafine for toenails  Cephalexin for your foot infection. If no improvement please return

## 2014-10-17 NOTE — Progress Notes (Signed)
Patient here for follow up Complains of having dental pain but has no insurance  Patient also states he cut a piece of skin off his right foot now area is red and inflamed Patient also has a patch of poison ivy to his right arm

## 2014-10-17 NOTE — Progress Notes (Signed)
Patient ID: [name] [name], male   DOB: August 03, 1960, 55 y.o.   MRN: [id number] SUBJECTIVE: 54 y.o. male for follow up of diabetes. Diabetic Review of Systems - medication compliance: compliant all of the time, diabetic diet compliance: compliant most of the time, home glucose monitoring: is performed sporadically.  Other symptoms and concerns: Right foot pain---clipped dry skin off on right great toe four days ago. Now area is open and red. He has diabetic neuropathy and only feels numbness in his feet.. 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 Lack of insurance prevents him from seeing a dentist. He also complains of poison ivy exposure 1 week ago to the right [facility] area. He has tried Calamine lotion with mild relief.   Current Outpatient Prescriptions  Medication Sig Dispense Refill  . buPROPion (WELLBUTRIN SR) 150 MG 12 hr tablet Take 150 mg by mouth 2 (two) times daily.    . diclofenac (VOLTAREN) 75 MG EC tablet Take 1 tablet (75 mg total) by mouth 2 (two) times daily. 60 tablet 0  . gabapentin ([name]) 600 MG tablet TAKE 1 TABLET MOUTH 3 TIMES DAY 90 tablet 4  . glucose blood test strip Use as instructed 100 each 12  . glucose monitoring kit (FREESTYLE) monitoring kit 1 each by Does not apply route as needed. 1 each 0  . Lancets (FREESTYLE) lancets Use as instructed 100 each 12  . lisinopril-hydrochlorothiazide (PRINZIDE,ZESTORETIC) 20-12.5 MG per tablet Take 1 tablet by mouth daily. 30 tablet 4  . metFORMIN (GLUCOPHAGE) 500 MG tablet Take 1 tablet (500 mg total) by mouth 2 (two) times daily with a meal. 60 tablet 4  . methadone (DOLOPHINE) 10 MG/ML solution Take 155 mg by mouth daily.    . methocarbamol (ROBAXIN) 500 MG tablet Take 1-2 tablets (500-1,000 mg total) by mouth every 6 (six) hours as needed for muscle spasms. 60 tablet 0  . omeprazole (PRILOSEC) 20 MG capsule Take 1 capsule (20 mg total) by mouth 2 (two) times daily before a meal. (Patient taking differently: Take 20 mg  by mouth daily as needed (indigestion/acid reflux). ) 60 capsule 1  . QUEtiapine (SEROQUEL XR) 300 MG 24 hr tablet Take 300 mg by mouth at bedtime.    . terbinafine (LAMISIL AT) 1 % cream Apply 1 application topically 2 (two) times daily. 30 g 0   No current facility-administered medications for this visit.    OBJECTIVE: Appearance: alert, well appearing, and in no distress, oriented to person, place, and time and overweight. BP 144/92 mmHg  Pulse 60  Temp(Src) 97.8 F (36.6 C)  Resp 16  Ht $R'6\' 1"'lr$  (1.854 m)  Wt 232 lb 12.8 oz (105.597 kg)  BMI 30.72 kg/m2  SpO2 100%  Physical Exam  Constitutional: He is oriented to person, place, and time.  HENT:  Mouth/Throat: Oropharynx is clear and moist. Dental caries present.  Cardiovascular: Normal rate, regular rhythm and normal heart sounds.   Pulses:      Dorsalis pedis pulses are 2+ on the right side, and 2+ on the left side.       Posterior tibial pulses are 2+ on the right side, and 2+ on the left side.  Pulmonary/Chest: Effort normal and breath sounds normal.  Musculoskeletal: He exhibits no edema.  Feet:  Right Foot:  Skin Integrity: Positive for erythema (right great toe, side with open area) and dry skin.  Left Foot:  Skin Integrity: Positive for dry skin. Negative for skin breakdown.  Neurological: He is alert  and oriented to person, place, and time.  Skin: Skin is warm and dry.  Right second toenail-black     [name] was seen today for foot pain.  Diagnoses and all orders for this visit:  Type 2 diabetes mellitus with diabetic polyneuropathy, without long-term current use of insulin (HCC) -     Glucose (CBG) -     HgB A1c -     Refilled metFORMIN (GLUCOPHAGE) 500 MG tablet; Take 1 tablet (500 mg total) by mouth 2 (two) times daily with a meal. -     Microalbumin/Creatinine Ratio, Urine Patients diabetes is well control as evidence by consistently low a1c.  Patient will continue with current therapy and continue to make  necessary lifestyle changes.  Reviewed foot care, diet, exercise, annual health maintenance with patient.   Essential hypertension -     lisinopril-hydrochlorothiazide (PRINZIDE,ZESTORETIC) 20-12.5 MG tablet; Take 1 tablet by mouth daily. -     Basic Metabolic Panel BP slightly elevated but likely due to dental pain currently  Foot infection -    Begin cephALEXin (KEFLEX) 500 MG capsule; Take 1 capsule (500 mg total) by mouth 2 (two) times daily. Keep area clean and dry. Get Eucerin cream for dry skin.   Onychomycosis -     terbinafine (LAMISIL AT) 1 % cream; Apply 1 application topically 2 (two) times daily.  Poison ivy dermatitis -     clobetasol cream (TEMOVATE) 0.05 %; Apply 1 application topically 2 (two) times daily. To affected area only   Return in about 3 months (around 01/17/2015) for DM/HTN.  Lance Bosch, NP 10/18/2014 9:22 AM

## 2014-10-18 LAB — MICROALBUMIN / CREATININE URINE RATIO
Creatinine, Urine: 124.7 mg/dL
Microalb Creat Ratio: 5.6 mg/g (ref 0.0–30.0)
Microalb, Ur: 0.7 mg/dL (ref <2.0)

## 2014-10-23 ENCOUNTER — Telehealth: Payer: Self-pay

## 2014-10-23 NOTE — Telephone Encounter (Signed)
-----   Message from Lance Bosch, NP sent at 10/18/2014  9:32 PM EDT ----- Labs are within normal limits

## 2014-10-23 NOTE — Telephone Encounter (Signed)
Patient not available Left message on voice mail to return our call 

## 2014-10-28 ENCOUNTER — Encounter (HOSPITAL_COMMUNITY): Payer: Self-pay | Admitting: Emergency Medicine

## 2014-10-28 ENCOUNTER — Emergency Department (HOSPITAL_COMMUNITY)
Admission: EM | Admit: 2014-10-28 | Discharge: 2014-10-28 | Disposition: A | Discharge status: Home / Self Care | Payer: Self-pay | Attending: Emergency Medicine | Admitting: Emergency Medicine

## 2014-10-28 ENCOUNTER — Emergency Department (HOSPITAL_COMMUNITY): Payer: Self-pay

## 2014-10-28 DIAGNOSIS — Z79899 Other long term (current) drug therapy: Secondary | ICD-10-CM | POA: Insufficient documentation

## 2014-10-28 DIAGNOSIS — E11628 Type 2 diabetes mellitus with other skin complications: Secondary | ICD-10-CM

## 2014-10-28 DIAGNOSIS — L97519 Non-pressure chronic ulcer of other part of right foot with unspecified severity: Secondary | ICD-10-CM | POA: Insufficient documentation

## 2014-10-28 DIAGNOSIS — E1140 Type 2 diabetes mellitus with diabetic neuropathy, unspecified: Secondary | ICD-10-CM | POA: Insufficient documentation

## 2014-10-28 DIAGNOSIS — E11621 Type 2 diabetes mellitus with foot ulcer: Secondary | ICD-10-CM | POA: Insufficient documentation

## 2014-10-28 DIAGNOSIS — I10 Essential (primary) hypertension: Secondary | ICD-10-CM | POA: Insufficient documentation

## 2014-10-28 DIAGNOSIS — L089 Local infection of the skin and subcutaneous tissue, unspecified: Secondary | ICD-10-CM

## 2014-10-28 DIAGNOSIS — Z7952 Long term (current) use of systemic steroids: Secondary | ICD-10-CM | POA: Insufficient documentation

## 2014-10-28 DIAGNOSIS — Z794 Long term (current) use of insulin: Secondary | ICD-10-CM | POA: Insufficient documentation

## 2014-10-28 DIAGNOSIS — Z791 Long term (current) use of non-steroidal anti-inflammatories (NSAID): Secondary | ICD-10-CM | POA: Insufficient documentation

## 2014-10-28 DIAGNOSIS — Z792 Long term (current) use of antibiotics: Secondary | ICD-10-CM | POA: Insufficient documentation

## 2014-10-28 DIAGNOSIS — F3130 Bipolar disorder, current episode depressed, mild or moderate severity, unspecified: Secondary | ICD-10-CM | POA: Insufficient documentation

## 2014-10-28 DIAGNOSIS — Z8669 Personal history of other diseases of the nervous system and sense organs: Secondary | ICD-10-CM | POA: Insufficient documentation

## 2014-10-28 LAB — CBC WITH DIFFERENTIAL/PLATELET
Basophils Absolute: 0 10*3/uL (ref 0.0–0.1)
Basophils Relative: 1 %
Eosinophils Absolute: 0.2 10*3/uL (ref 0.0–0.7)
Eosinophils Relative: 4 %
HCT: 33.3 % — ABNORMAL LOW (ref 39.0–52.0)
Hemoglobin: 11.4 g/dL — ABNORMAL LOW (ref 13.0–17.0)
Lymphocytes Relative: 27 %
Lymphs Abs: 1.2 10*3/uL (ref 0.7–4.0)
MCH: 28.9 pg (ref 26.0–34.0)
MCHC: 34.2 g/dL (ref 30.0–36.0)
MCV: 84.3 fL (ref 78.0–100.0)
Monocytes Absolute: 0.3 10*3/uL (ref 0.1–1.0)
Monocytes Relative: 7 %
Neutro Abs: 2.6 10*3/uL (ref 1.7–7.7)
Neutrophils Relative %: 61 %
Platelets: 126 10*3/uL — ABNORMAL LOW (ref 150–400)
RBC: 3.95 MIL/uL — ABNORMAL LOW (ref 4.22–5.81)
RDW: 14.4 % (ref 11.5–15.5)
WBC: 4.3 10*3/uL (ref 4.0–10.5)

## 2014-10-28 LAB — BASIC METABOLIC PANEL
Anion gap: 8 (ref 5–15)
BUN: 9 mg/dL (ref 6–20)
CO2: 29 mmol/L (ref 22–32)
Calcium: 9.1 mg/dL (ref 8.9–10.3)
Chloride: 96 mmol/L — ABNORMAL LOW (ref 101–111)
Creatinine, Ser: 0.94 mg/dL (ref 0.61–1.24)
GFR calc Af Amer: >60 mL/min (ref >60)
GFR calc non Af Amer: >60 mL/min (ref >60)
Glucose, Bld: 346 mg/dL — ABNORMAL HIGH (ref 65–99)
Potassium: 4.5 mmol/L (ref 3.5–5.1)
Sodium: 133 mmol/L — ABNORMAL LOW (ref 135–145)

## 2014-10-28 MED ORDER — CIPROFLOXACIN IN D5W 400 MG/200ML IV SOLN
400.0000 mg | Freq: Once | INTRAVENOUS | Status: AC
  Administered 2014-10-28: 400 mg via INTRAVENOUS
  Filled 2014-10-28: qty 200

## 2014-10-28 MED ORDER — CLINDAMYCIN HCL 300 MG PO CAPS: 300.0000 mg | ORAL_CAPSULE | Freq: Four times a day (QID) | ORAL | Status: DC

## 2014-10-28 MED ORDER — CIPROFLOXACIN HCL 750 MG PO TABS: 750.0000 mg | ORAL_TABLET | Freq: Two times a day (BID) | ORAL | Status: DC

## 2014-10-28 MED ORDER — CLINDAMYCIN PHOSPHATE 600 MG/50ML IV SOLN
600.0000 mg | Freq: Once | INTRAVENOUS | Status: AC
  Administered 2014-10-28: 600 mg via INTRAVENOUS
  Filled 2014-10-28: qty 50

## 2014-10-28 MED ORDER — SODIUM CHLORIDE 0.9 % IV BOLUS (SEPSIS)
1000.0000 mL | Freq: Once | INTRAVENOUS | Status: AC
  Administered 2014-10-28: 1000 mL via INTRAVENOUS

## 2014-10-28 NOTE — ED Notes (Signed)
Pt states that he developed on ulcer on his foot about a week ago. Pt reports seeing his PCP for an infection in the wound, which has not cleared up. Wound is to pt's right great toe.

## 2014-10-28 NOTE — Care Management Note (Addendum)
Case Management Note  Patient Details  Name: [name] [name] MRN: [id number] Date of Birth: 11/04/1960  Subjective/Objective:  Pleasant  54 y.o. M to be treated  with p.o. antibiotics, CM consulted by MD to provide assistance with out-patient Medications.   Has been on Keflex for Cellulitis Foot. Reports he is a pt at [facility] but has been unable to get appt this week. Has f/u appt Friday 11/04/2014 which he promises he will  Make if at all possible. He also verbalizes understanding of the importance of taking all of the prescribed antibiotics until completed.               Action/Plan: Provided with [place] letter which he is eligible for. [place]  ED CM consulted by EDP, Ray for medication assistance   CM reviewed EPIC notes and chart review information CM spoke with the pt about [facility] MATCH program ($3 co pay for each Rx through [facility] program, does not include refills, 7 day expiration of MATCH letter and choice of pharmacies) Pt agreed to receive assistance from program    Pt is eligible for [facility] MATCH program (unable to find pt listed in PDMI per cardholder name inquiry)  PDMI information entered. [place] letter completed and provided to pt.   CM updated EDP and ED RN   Pending confirmation of d/c medication list     Expected Discharge Date:                  Expected Discharge Plan:  Home/Self Care  In-House Referral:     Discharge planning Services  CM Consult, [place] Program  Post Acute Care Choice:    Choice offered to:     DME Arranged:    DME Agency:     HH Arranged:    HH Agency:     Status of Service:  Completed, signed off  Medicare Important Message Given:    Date Medicare IM Given:    Medicare IM give by:    Date Additional Medicare IM Given:    Additional Medicare Important Message give by:     If discussed at [place] of Stay Meetings, dates discussed:    Additional Comments:  Delrae Sawyers, RN 10/28/2014, 11:35 AM

## 2014-10-28 NOTE — Discharge Instructions (Signed)
Read the information below.  Use the prescribed medication as directed.  Please discuss all new medications with your pharmacist.  You may return to the Emergency Department at any time for worsening condition or any new symptoms that concern you.   It is very important that you fill your prescriptions and start taking them today.  If you develop increased redness, swelling, pus draining from the wound, or fevers greater than 100.4, return to the ER immediately for a recheck.   Please return to the Emergency Department in 2 days for a recheck and sooner if you notice any worsening of your toe.  Please monitor it closely.    Cellulitis Cellulitis is an infection of the skin and the tissue under the skin. The infected area is usually red and tender. This happens most often in the arms and lower legs. HOME CARE   Take your antibiotic medicine as told. Finish the medicine even if you start to feel better.  Keep the infected arm or leg raised (elevated).  Put a warm cloth on the area up to 4 times per day.  Only take medicines as told by your doctor.  Keep all doctor visits as told. GET HELP IF:  You see red streaks on the skin coming from the infected area.  Your red area gets bigger or turns a dark color.  Your bone or joint under the infected area is painful after the skin heals.  Your infection comes back in the same area or different area.  You have a puffy (swollen) bump in the infected area.  You have new symptoms.  You have a fever. GET HELP RIGHT AWAY IF:   You feel very sleepy.  You throw up (vomit) or have watery poop (diarrhea).  You feel sick and have muscle aches and pains.   This information is not intended to replace advice given to you by your health care provider. Make sure you discuss any questions you have with your health care provider.   Document Released: 06/18/2007 Document Revised: 09/20/2014 Document Reviewed: 03/17/2011 Elsevier Interactive Patient  Education [employer].

## 2014-10-28 NOTE — ED Provider Notes (Signed)
CSN: [id number]     Arrival date & time 10/28/14  4315 History   First MD Initiated Contact with Patient 10/28/14 [phone]     Chief Complaint  Patient presents with  . Diabetic Ulcer     (Consider location/radiation/quality/duration/timing/severity/associated sxs/prior Treatment) The history is provided by the patient and medical records.     Pt with hx DM and diabetic neuropathy presents with right great toe infection x 1.5 weeks.  Around that time he noticed dead skin on the bottom of his toe and clipped it off, a few days later noticed the toe was red.  Was seen by PCP and started on Keflex.  He finished the Keflex and happened to be put on Amoxicillin following this after having tooth pulled.  States the toe is unchanged.  Denies fevers, chills, body aches.  Otherwise feels fine.  Has noticed his blood sugars have been elevated.  Was going to go to Urgent Care today but they are closed.    Past Medical History  Diagnosis Date  . Depressed bipolar disorder ( [place])   . Hypertension   . Diabetes mellitus without complication ([place])   . Sleep apnea   . Diabetic neuropathy [facility])    Past Surgical History  Procedure Laterality Date  . Spine surgery  2010  . Shoulder arthroscopy with rotator cuff repair Right 2009   Family History  Problem Relation Age of Onset  . Diabetes Mother   . Hyperlipidemia Mother   . Heart disease Father    Social History  Substance Use Topics  . Smoking status: Never Smoker   . Smokeless tobacco: None  . Alcohol Use: No    Review of Systems  All other systems reviewed and are negative.     Allergies  Oxytetracycline and Sulfonamide derivatives  Home Medications   Prior to Admission medications   Medication Sig Start Date End Date Taking? Authorizing Provider  buPROPion (WELLBUTRIN SR) 150 MG 12 hr tablet Take 150 mg by mouth 2 (two) times daily.    Historical Provider, MD  cephALEXin (KEFLEX) 500 MG capsule Take 1 capsule (500 mg total) by  mouth 2 (two) times daily. 10/17/14   Lance Bosch, NP  clobetasol (TEMOVATE) 0.05 % GEL Apply to affected area only 10/17/14   Lance Bosch, NP  clobetasol cream (TEMOVATE) 6.76 % Apply 1 application topically 2 (two) times daily. To affected area only 10/17/14   Lance Bosch, NP  diclofenac (VOLTAREN) 75 MG EC tablet Take 1 tablet (75 mg total) by mouth 2 (two) times daily. 08/02/14   Waldemar Dickens, MD  gabapentin ([name]) 600 MG tablet TAKE 1 TABLET MOUTH 3 TIMES DAY 10/03/14   Lance Bosch, NP  glucose blood test strip Use as instructed 05/24/14   Lance Bosch, NP  glucose monitoring kit (FREESTYLE) monitoring kit 1 each by Does not apply route as needed. 05/24/14   Lance Bosch, NP  Lancets (FREESTYLE) lancets Use as instructed 05/24/14   Lance Bosch, NP  lisinopril-hydrochlorothiazide (PRINZIDE,ZESTORETIC) 20-12.5 MG tablet Take 1 tablet by mouth daily. 10/17/14   Lance Bosch, NP  metFORMIN (GLUCOPHAGE) 500 MG tablet Take 1 tablet (500 mg total) by mouth 2 (two) times daily with a meal. 10/17/14   Lance Bosch, NP  methadone (DOLOPHINE) 10 MG/ML solution Take 185 mg by mouth daily.     Historical Provider, MD  methocarbamol (ROBAXIN) 500 MG tablet Take 1-2 tablets (500-1,000 mg total) by mouth every 6 (  six) hours as needed for muscle spasms. 08/02/14   Waldemar Dickens, MD  omeprazole (PRILOSEC) 20 MG capsule Take 1 capsule (20 mg total) by mouth 2 (two) times daily before a meal. Patient taking differently: Take 20 mg by mouth daily as needed (indigestion/acid reflux).  01/01/14   Barton Dubois, MD  QUEtiapine (SEROQUEL XR) 300 MG 24 hr tablet Take 300 mg by mouth at bedtime.    Historical Provider, MD  terbinafine (LAMISIL AT) 1 % cream Apply 1 application topically 2 (two) times daily. 10/17/14   Lance Bosch, NP   BP 134/76 mmHg  Pulse 81  Temp(Src) 98.4 F (36.9 C) (Oral)  Resp 14  Ht $R'6\' 2"'Tf$  (1.88 m)  Wt 234 lb (106.142 kg)  BMI 30.03 kg/m2  SpO2 100% Physical Exam    Constitutional: He appears well-developed and well-nourished. No distress.  HENT:  Head: Normocephalic and atraumatic.  Neck: Neck supple.  Cardiovascular: Normal rate and regular rhythm.   Pulmonary/Chest: Effort normal and breath sounds normal. No respiratory distress. He has no wheezes. He has no rales.  Abdominal: Soft. He exhibits no distension and no mass. There is no tenderness. There is no rebound and no guarding.  Musculoskeletal:  Right great toe with erythema, large callous.  Decreased sensation chronically.  Capillary refill < 2 seconds.  No discharge.  No fluctuance or induration.   Neurological: He is alert. He exhibits normal muscle tone.  Skin: He is not diaphoretic.  Nursing note and vitals reviewed.        ED Course  Procedures (including critical care time) Labs Review Labs Reviewed  BASIC METABOLIC PANEL - Abnormal; Notable for the following:    Sodium 133 (*)    Chloride 96 (*)    Glucose, Bld 346 (*)    All other components within normal limits  CBC WITH DIFFERENTIAL/PLATELET - Abnormal; Notable for the following:    RBC 3.95 (*)    Hemoglobin 11.4 (*)    HCT 33.3 (*)    Platelets 126 (*)    All other components within normal limits    Imaging Review Dg Toe Great Right  10/28/2014  CLINICAL DATA:  Ulcer EXAM: RIGHT GREAT TOE COMPARISON:  None. FINDINGS: There is no obvious destructive bone lesion. Soft tissue swelling is present medial to the IP joint of the great toe. Small avulsion fracture at the palm are base of 1 of the middle phalanges in the toes. Age is indeterminate. No evidence of great toe fracture. IMPRESSION: There is a small avulsion fracture involving the palm are base of 1 of the middle phalanges in the toes other then the great toe. No obvious bony destruction. Electronically Signed   By: Marybelle Killings M.D.   On: 10/28/2014 08:51   I have personally reviewed and evaluated these images and lab results as part of my medical  decision-making.   EKG Interpretation None        MDM   Final diagnoses:  Diabetic foot infection ([place])    Afebrile, nontoxic patient with hx diabetes with cellulitis of great toe.  No systemic symptoms.  Labs remarkable for hyperglycemia.  Pt previously put on keflex for this infection but I believe he actually needs broader coverage.  Pt given IV clindamycin and cipro in ED and monitored - cellulitis did not worsen.  Pt states he can fill his medications and will be able to take them.  Case manager gave him a match letter so he could afford them.  He will be able to follow up for a recheck in two days and we discussed at length the importance of returning to ED for any worsening.  Line drawn around erythematous area by nurse and pt advised to monitor it closely.  Xray not concerning for osteomyelitis.  He does have fracture of undetermined age in another toe, that pt believes occurred many years ago while he was working in the [employer].  D/C home with clindamycin, ciprofloxacin.  Discussed these medications with the pharmacist, including the possible interaction between ciprofloxacin and patient's daily methadone.  Pt to return to ED in 2 days for recheck or sooner for worsening symptoms.  He also had appt scheduled with [facility] for 6 days from today.   Discussed result, findings, treatment, and follow up  with patient.  Pt given return precautions.  Pt verbalizes understanding and agrees with plan.        Clayton Bibles, PA-C 10/28/14 1443  Pattricia Boss, MD 10/30/14 1323

## 2014-11-03 ENCOUNTER — Ambulatory Visit: Payer: Self-pay | Attending: Internal Medicine | Admitting: Internal Medicine

## 2014-11-03 ENCOUNTER — Encounter: Payer: Self-pay | Admitting: Internal Medicine

## 2014-11-03 VITALS — BP 150/84 | HR 69 | Temp 98.1°F | Resp 16 | Ht 74.0 in | Wt 225.0 lb

## 2014-11-03 DIAGNOSIS — E1169 Type 2 diabetes mellitus with other specified complication: Secondary | ICD-10-CM

## 2014-11-03 DIAGNOSIS — Z7984 Long term (current) use of oral hypoglycemic drugs: Secondary | ICD-10-CM | POA: Insufficient documentation

## 2014-11-03 DIAGNOSIS — F319 Bipolar disorder, unspecified: Secondary | ICD-10-CM | POA: Insufficient documentation

## 2014-11-03 DIAGNOSIS — L089 Local infection of the skin and subcutaneous tissue, unspecified: Secondary | ICD-10-CM | POA: Insufficient documentation

## 2014-11-03 DIAGNOSIS — E1140 Type 2 diabetes mellitus with diabetic neuropathy, unspecified: Secondary | ICD-10-CM | POA: Insufficient documentation

## 2014-11-03 DIAGNOSIS — Z882 Allergy status to sulfonamides status: Secondary | ICD-10-CM | POA: Insufficient documentation

## 2014-11-03 DIAGNOSIS — Z79899 Other long term (current) drug therapy: Secondary | ICD-10-CM | POA: Insufficient documentation

## 2014-11-03 DIAGNOSIS — I10 Essential (primary) hypertension: Secondary | ICD-10-CM | POA: Insufficient documentation

## 2014-11-03 DIAGNOSIS — L84 Corns and callosities: Secondary | ICD-10-CM | POA: Insufficient documentation

## 2014-11-03 DIAGNOSIS — Z833 Family history of diabetes mellitus: Secondary | ICD-10-CM | POA: Insufficient documentation

## 2014-11-03 DIAGNOSIS — E11628 Type 2 diabetes mellitus with other skin complications: Secondary | ICD-10-CM | POA: Insufficient documentation

## 2014-11-03 DIAGNOSIS — Z8249 Family history of ischemic heart disease and other diseases of the circulatory system: Secondary | ICD-10-CM | POA: Insufficient documentation

## 2014-11-03 DIAGNOSIS — G4730 Sleep apnea, unspecified: Secondary | ICD-10-CM | POA: Insufficient documentation

## 2014-11-03 NOTE — Progress Notes (Signed)
Patient ID: [name] [name], male   DOB: [birth year], 54 y.o.   MRN: [id number]  CC: toe infection  HPI: [name] [name] is a 54 y.o. male here today for a follow up visit.  Patient has past medical history of diabetes, HTN, sleep apnea, bipolar depression, and substance abuse on Methadone. Patient reports that he was seen in the ER 6 days ago for worsening right great toe infection. He was seen by me earlier this month after clipping dead skin off his foot that became infected. He was given Keflex by me but states that he never picked up the medication because he did not have the money. He reports that the area became worse so he went to the ED and received a prescription for Clindamycin and Cipro which has been causing him GI discomfort. He recently started taking probiotics to help his GI discomfort and has noticed some improvement but admits to not taking medication as directed. He reports that he only took clindamycin and Cipro for 3 days and stopped. He has since been taking the remaining amoxicillin tablets only because they do not upset his stomach. He would like his foot examined to make sure he is healing properly. He denies symptoms of fever, chills, nausea, vomiting.   Allergies  Allergen Reactions  . Oxytetracycline Rash  . Sulfonamide Derivatives Rash   Past Medical History  Diagnosis Date  . Depressed bipolar disorder ([place])   . Hypertension   . Diabetes mellitus without complication ([place])   . Sleep apnea   . Diabetic neuropathy [facility])    Current Outpatient Prescriptions on File Prior to Visit  Medication Sig Dispense Refill  . buPROPion (WELLBUTRIN SR) 150 MG 12 hr tablet Take 150 mg by mouth 2 (two) times daily.    . ciprofloxacin (CIPRO) 750 MG tablet Take 1 tablet (750 mg total) by mouth 2 (two) times daily. One po bid x 7 days 20 tablet 0  . clindamycin (CLEOCIN) 300 MG capsule Take 1 capsule (300 mg total) by mouth 4 (four) times daily. X 10 days 40 capsule 0  .  gabapentin ([name]) 600 MG tablet TAKE 1 TABLET MOUTH 3 TIMES DAY 90 tablet 4  . lisinopril-hydrochlorothiazide (PRINZIDE,ZESTORETIC) 20-12.5 MG tablet Take 1 tablet by mouth daily. 30 tablet 4  . metFORMIN (GLUCOPHAGE) 500 MG tablet Take 1 tablet (500 mg total) by mouth 2 (two) times daily with a meal. 60 tablet 4  . terbinafine (LAMISIL AT) 1 % cream Apply 1 application topically 2 (two) times daily. 30 g 1  . amoxicillin (AMOXIL) 500 MG capsule Take 500 mg by mouth every 6 (six) hours.  0  . cephALEXin (KEFLEX) 500 MG capsule Take 1 capsule (500 mg total) by mouth 2 (two) times daily. (Patient not taking: Reported on 10/28/2014) 14 capsule 0  . clobetasol (TEMOVATE) 0.05 % GEL Apply to affected area only 1 each 0  . clobetasol cream (TEMOVATE) 2.58 % Apply 1 application topically 2 (two) times daily. To affected area only (Patient not taking: Reported on 10/28/2014) 30 g 0  . diclofenac (VOLTAREN) 75 MG EC tablet Take 1 tablet (75 mg total) by mouth 2 (two) times daily. (Patient not taking: Reported on 10/28/2014) 60 tablet 0  . methadone (DOLOPHINE) 10 MG/ML solution Take 185 mg by mouth daily.     . methocarbamol (ROBAXIN) 500 MG tablet Take 1-2 tablets (500-1,000 mg total) by mouth every 6 (six) hours as needed for muscle spasms. (Patient not taking: Reported on 10/28/2014) 60  tablet 0  . omeprazole (PRILOSEC) 20 MG capsule Take 1 capsule (20 mg total) by mouth 2 (two) times daily before a meal. (Patient taking differently: Take 20 mg by mouth daily as needed (indigestion/acid reflux). ) 60 capsule 1  . QUEtiapine (SEROQUEL XR) 300 MG 24 hr tablet Take 300 mg by mouth at bedtime.     No current facility-administered medications on file prior to visit.   Family History  Problem Relation Age of Onset  . Diabetes Mother   . Hyperlipidemia Mother   . Heart disease Father    Social History   Social History  . Marital Status: Single    Spouse Name: N/A  . Number of Children: N/A  .  Years of Education: N/A   Occupational History  . Not on file.   Social History Main Topics  . Smoking status: Never Smoker   . Smokeless tobacco: Not on file  . Alcohol Use: No  . Drug Use: No  . Sexual Activity: Not on file   Other Topics Concern  . Not on file   Social History Narrative    Review of Systems: Other than what is stated in HPI, all other systems are negative.   Objective:   Filed Vitals:   11/03/14 1529  BP: 150/84  Pulse: 69  Temp: 98.1 F (36.7 C)  Resp: 16    Physical Exam  Constitutional: He is oriented to person, place, and time.  Cardiovascular: Normal rate, regular rhythm and normal heart sounds.   Pulmonary/Chest: Effort normal and breath sounds normal.  Feet:  Right Foot:  Skin Integrity: Positive for ulcer, skin breakdown, erythema (improved from ER picture) and callus.  Neurological: He is alert and oriented to person, place, and time.  Skin: Skin is warm and dry.  Psychiatric: He has a normal mood and affect.     Lab Results  Component Value Date   WBC 4.3 10/28/2014   HGB 11.4* 10/28/2014   HCT 33.3* 10/28/2014   MCV 84.3 10/28/2014   PLT 126* 10/28/2014   Lab Results  Component Value Date   CREATININE 0.94 10/28/2014   BUN 9 10/28/2014   NA 133* 10/28/2014   K 4.5 10/28/2014   CL 96* 10/28/2014   CO2 29 10/28/2014    Lab Results  Component Value Date   HGBA1C 7.50 10/17/2014   Lipid Panel     Component Value Date/Time   CHOL 151 01/01/2014 0337   TRIG 196* 01/01/2014 0337   HDL 34* 01/01/2014 0337   CHOLHDL 4.4 01/01/2014 0337   VLDL 39 01/01/2014 0337   LDLCALC 78 01/01/2014 0337       Assessment and plan:   [name] [name] was seen today for follow-up.  Diagnoses and all orders for this visit:  Diabetic foot infection ([place]) I have encouraged patient to start back taking Clindamycin and Cipro to help heal his infection. I have given him a script for Lactinex Probiotics to take 3 times per day to help with GI  upset from medication use. I have written him a medication schedule to split up antibiotics to hopefully increase chance of tolerance.  6am, 12 noon, 5 pm, and 10 pm he will take Clindamycin and 8 am and 8 pm he will take Cipro. He will then take Lactinex at 6 am, 12 pm, and 5 pm. I will see him back in 1 week for a wound check.   Return for friday-wound check with PCP .   Lance Bosch, NP-C  [facility] [phone] 11/03/2014, 3:54 PM

## 2014-11-03 NOTE — Progress Notes (Signed)
Patient here for follow up from ED Patient has an infection to his right great toe Was seen over the weekend in the ED and prescribed cipro and cleocin Medications were making him sick  But patient has since started taking  Probiotics which seem to be helping but patient does not like the way the toe is looking

## 2014-11-09 ENCOUNTER — Ambulatory Visit: Payer: Self-pay | Attending: Internal Medicine | Admitting: Internal Medicine

## 2014-11-09 ENCOUNTER — Encounter: Payer: Self-pay | Admitting: Internal Medicine

## 2014-11-09 VITALS — BP 136/87 | HR 64 | Temp 98.2°F | Resp 16 | Ht 74.0 in | Wt 227.0 lb

## 2014-11-09 DIAGNOSIS — G4730 Sleep apnea, unspecified: Secondary | ICD-10-CM | POA: Insufficient documentation

## 2014-11-09 DIAGNOSIS — L97519 Non-pressure chronic ulcer of other part of right foot with unspecified severity: Secondary | ICD-10-CM | POA: Insufficient documentation

## 2014-11-09 DIAGNOSIS — F319 Bipolar disorder, unspecified: Secondary | ICD-10-CM | POA: Insufficient documentation

## 2014-11-09 DIAGNOSIS — I10 Essential (primary) hypertension: Secondary | ICD-10-CM | POA: Insufficient documentation

## 2014-11-09 DIAGNOSIS — Z7984 Long term (current) use of oral hypoglycemic drugs: Secondary | ICD-10-CM | POA: Insufficient documentation

## 2014-11-09 DIAGNOSIS — E1140 Type 2 diabetes mellitus with diabetic neuropathy, unspecified: Secondary | ICD-10-CM | POA: Insufficient documentation

## 2014-11-09 DIAGNOSIS — Z833 Family history of diabetes mellitus: Secondary | ICD-10-CM | POA: Insufficient documentation

## 2014-11-09 DIAGNOSIS — E11621 Type 2 diabetes mellitus with foot ulcer: Secondary | ICD-10-CM | POA: Insufficient documentation

## 2014-11-09 DIAGNOSIS — Z882 Allergy status to sulfonamides status: Secondary | ICD-10-CM | POA: Insufficient documentation

## 2014-11-09 DIAGNOSIS — Z8249 Family history of ischemic heart disease and other diseases of the circulatory system: Secondary | ICD-10-CM | POA: Insufficient documentation

## 2014-11-09 DIAGNOSIS — E119 Type 2 diabetes mellitus without complications: Secondary | ICD-10-CM

## 2014-11-09 LAB — GLUCOSE, POCT (MANUAL RESULT ENTRY): POC Glucose: 177 mg/dl — AB (ref 70–99)

## 2014-11-09 NOTE — Progress Notes (Signed)
Patient here for follow up on his wound to his right great toe

## 2014-11-09 NOTE — Progress Notes (Signed)
Patient ID: [name] [name], male   DOB: 1960-05-17, 54 y.o.   MRN: [id number]  CC: wound check   HPI: Patient presents to clinic today for a quick wound check. He was seen last week after a ED visit for a diabetic foot wound on the lateral right great toe. At that time patient explained that he stopped taking the Cipro and Clindamycin given to him by the ED because it caused GI upset. After being seen by me he was given a prescription for Lactinex to take three times per day to help with abdominal discomfort. Today he reports that he has been taking dual antibiotic therapy as prescribed and has improvement in symptoms. He does note that the right great toe is still erythematous but is less than before and is less painful. He reports plans to speak with his mother's podiatrist to see if he can be seen next week and make payments. He continues to deny warmth, fever, chills, nausea, or vomiting.  Allergies  Allergen Reactions  . Oxytetracycline Rash  . Sulfonamide Derivatives Rash   Past Medical History  Diagnosis Date  . Depressed bipolar disorder ([place])   . Hypertension   . Diabetes mellitus without complication ([place])   . Sleep apnea   . Diabetic neuropathy ([place])    Current Outpatient Prescriptions on File Prior to Visit  Medication Sig Dispense Refill  . amoxicillin (AMOXIL) 500 MG capsule Take 500 mg by mouth every 6 (six) hours.  0  . buPROPion (WELLBUTRIN SR) 150 MG 12 hr tablet Take 150 mg by mouth 2 (two) times daily.    . cephALEXin (KEFLEX) 500 MG capsule Take 1 capsule (500 mg total) by mouth 2 (two) times daily. (Patient not taking: Reported on 10/28/2014) 14 capsule 0  . ciprofloxacin (CIPRO) 750 MG tablet Take 1 tablet (750 mg total) by mouth 2 (two) times daily. One po bid x 7 days 20 tablet 0  . clindamycin (CLEOCIN) 300 MG capsule Take 1 capsule (300 mg total) by mouth 4 (four) times daily. X 10 days 40 capsule 0  . clobetasol (TEMOVATE) 0.05 % GEL Apply to affected area  only 1 each 0  . clobetasol cream (TEMOVATE) 8.93 % Apply 1 application topically 2 (two) times daily. To affected area only (Patient not taking: Reported on 10/28/2014) 30 g 0  . diclofenac (VOLTAREN) 75 MG EC tablet Take 1 tablet (75 mg total) by mouth 2 (two) times daily. (Patient not taking: Reported on 10/28/2014) 60 tablet 0  . gabapentin ([name]) 600 MG tablet TAKE 1 TABLET MOUTH 3 TIMES DAY 90 tablet 4  . lisinopril-hydrochlorothiazide (PRINZIDE,ZESTORETIC) 20-12.5 MG tablet Take 1 tablet by mouth daily. 30 tablet 4  . metFORMIN (GLUCOPHAGE) 500 MG tablet Take 1 tablet (500 mg total) by mouth 2 (two) times daily with a meal. 60 tablet 4  . methadone (DOLOPHINE) 10 MG/ML solution Take 185 mg by mouth daily.     . methocarbamol (ROBAXIN) 500 MG tablet Take 1-2 tablets (500-1,000 mg total) by mouth every 6 (six) hours as needed for muscle spasms. (Patient not taking: Reported on 10/28/2014) 60 tablet 0  . omeprazole (PRILOSEC) 20 MG capsule Take 1 capsule (20 mg total) by mouth 2 (two) times daily before a meal. (Patient taking differently: Take 20 mg by mouth daily as needed (indigestion/acid reflux). ) 60 capsule 1  . QUEtiapine (SEROQUEL XR) 300 MG 24 hr tablet Take 300 mg by mouth at bedtime.    . terbinafine (LAMISIL AT) 1 %  cream Apply 1 application topically 2 (two) times daily. 30 g 1   No current facility-administered medications on file prior to visit.   Family History  Problem Relation Age of Onset  . Diabetes Mother   . Hyperlipidemia Mother   . Heart disease Father    Social History   Social History  . Marital Status: Single    Spouse Name: N/A  . Number of Children: N/A  . Years of Education: N/A   Occupational History  . Not on file.   Social History Main Topics  . Smoking status: Never Smoker   . Smokeless tobacco: Not on file  . Alcohol Use: No  . Drug Use: No  . Sexual Activity: Not on file   Other Topics Concern  . Not on file   Social History  Narrative    Review of Systems: Other than what is stated in HPI, all other systems are negative.   Objective:   Filed Vitals:   11/09/14 0936  BP: 136/87  Pulse: 64  Temp: 98.2 F (36.8 C)  Resp: 16    Physical Exam  Constitutional: He is oriented to person, place, and time.  Musculoskeletal: He exhibits no edema or tenderness.  Feet:  Right Foot:  Skin Integrity: Positive for erythema (right great toe) and callus.  Neurological: He is alert and oriented to person, place, and time.  Skin:  R Great toe appears improved. Still a large callus on lateral toe  Psychiatric: He has a normal mood and affect.     Lab Results  Component Value Date   WBC 4.3 10/28/2014   HGB 11.4* 10/28/2014   HCT 33.3* 10/28/2014   MCV 84.3 10/28/2014   PLT 126* 10/28/2014   Lab Results  Component Value Date   CREATININE 0.94 10/28/2014   BUN 9 10/28/2014   NA 133* 10/28/2014   K 4.5 10/28/2014   CL 96* 10/28/2014   CO2 29 10/28/2014    Lab Results  Component Value Date   HGBA1C 7.50 10/17/2014   Lipid Panel     Component Value Date/Time   CHOL 151 01/01/2014 0337   TRIG 196* 01/01/2014 0337   HDL 34* 01/01/2014 0337   CHOLHDL 4.4 01/01/2014 0337   VLDL 39 01/01/2014 0337   LDLCALC 78 01/01/2014 0337       Assessment and plan:   [name] [name] was seen today for follow-up.  Diagnoses and all orders for this visit:  Diabetic ulcer of right foot associated with type 2 diabetes mellitus ([place]) Continue antibiotic and patient needs to be seen by Podiatry very soon. Due to patients history I still do not feel like patient is taking antibiotics as directed. Explained signs and symptoms that should warrant immediate attention.  Patient verbalized understanding with teach back used.  Type 2 diabetes mellitus without complication, without long-term current use of insulin ([facility]) -     Glucose (CBG) Stable.  Return in about 3 months (around 02/09/2015).       Lance Bosch,  [facility] [phone] 11/09/2014, 9:45 AM

## 2014-11-13 ENCOUNTER — Encounter (HOSPITAL_COMMUNITY): Payer: Self-pay | Admitting: Emergency Medicine

## 2014-11-13 ENCOUNTER — Emergency Department (HOSPITAL_COMMUNITY)
Admission: EM | Admit: 2014-11-13 | Discharge: 2014-11-13 | Disposition: A | Discharge status: Home / Self Care | Payer: Self-pay | Attending: Emergency Medicine | Admitting: Emergency Medicine

## 2014-11-13 ENCOUNTER — Emergency Department (HOSPITAL_COMMUNITY): Payer: Self-pay

## 2014-11-13 DIAGNOSIS — I10 Essential (primary) hypertension: Secondary | ICD-10-CM | POA: Insufficient documentation

## 2014-11-13 DIAGNOSIS — E11621 Type 2 diabetes mellitus with foot ulcer: Secondary | ICD-10-CM | POA: Insufficient documentation

## 2014-11-13 DIAGNOSIS — F3130 Bipolar disorder, current episode depressed, mild or moderate severity, unspecified: Secondary | ICD-10-CM | POA: Insufficient documentation

## 2014-11-13 DIAGNOSIS — Z8669 Personal history of other diseases of the nervous system and sense organs: Secondary | ICD-10-CM | POA: Insufficient documentation

## 2014-11-13 DIAGNOSIS — L97519 Non-pressure chronic ulcer of other part of right foot with unspecified severity: Secondary | ICD-10-CM | POA: Insufficient documentation

## 2014-11-13 DIAGNOSIS — E1140 Type 2 diabetes mellitus with diabetic neuropathy, unspecified: Secondary | ICD-10-CM | POA: Insufficient documentation

## 2014-11-13 DIAGNOSIS — Z79899 Other long term (current) drug therapy: Secondary | ICD-10-CM | POA: Insufficient documentation

## 2014-11-13 LAB — CBC WITH DIFFERENTIAL/PLATELET
Basophils Absolute: 0 10*3/uL (ref 0.0–0.1)
Basophils Relative: 1 %
Eosinophils Absolute: 0.2 10*3/uL (ref 0.0–0.7)
Eosinophils Relative: 2 %
HCT: 37.3 % — ABNORMAL LOW (ref 39.0–52.0)
Hemoglobin: 13 g/dL (ref 13.0–17.0)
Lymphocytes Relative: 33 %
Lymphs Abs: 2.5 10*3/uL (ref 0.7–4.0)
MCH: 29.4 pg (ref 26.0–34.0)
MCHC: 34.9 g/dL (ref 30.0–36.0)
MCV: 84.4 fL (ref 78.0–100.0)
Monocytes Absolute: 0.6 10*3/uL (ref 0.1–1.0)
Monocytes Relative: 8 %
Neutro Abs: 4.1 10*3/uL (ref 1.7–7.7)
Neutrophils Relative %: 56 %
Platelets: 198 10*3/uL (ref 150–400)
RBC: 4.42 MIL/uL (ref 4.22–5.81)
RDW: 14.2 % (ref 11.5–15.5)
WBC: 7.4 10*3/uL (ref 4.0–10.5)

## 2014-11-13 LAB — BASIC METABOLIC PANEL
Anion gap: 12 (ref 5–15)
BUN: 19 mg/dL (ref 6–20)
CO2: 27 mmol/L (ref 22–32)
Calcium: 9.6 mg/dL (ref 8.9–10.3)
Chloride: 97 mmol/L — ABNORMAL LOW (ref 101–111)
Creatinine, Ser: 0.94 mg/dL (ref 0.61–1.24)
GFR calc Af Amer: >60 mL/min (ref >60)
GFR calc non Af Amer: >60 mL/min (ref >60)
Glucose, Bld: 146 mg/dL — ABNORMAL HIGH (ref 65–99)
Potassium: 4.7 mmol/L (ref 3.5–5.1)
Sodium: 136 mmol/L (ref 135–145)

## 2014-11-13 NOTE — ED Provider Notes (Signed)
CSN: [id number]     Arrival date & time 11/13/14  1738 History   First MD Initiated Contact with Patient 11/13/14 2011     Chief Complaint  Patient presents with  . Toe Pain     (Consider location/radiation/quality/duration/timing/severity/associated sxs/prior Treatment) HPI 54 year old male who presents with wound evaluation. History of type 2 diabetes complicated by diabetic neuropathy. Has had wound over right great toe for some weeks now, just finished a course of Keflex followed by clindamycin and ciprofloxacin for treatment of a diabetic wound infection. States that the redness and swelling has been improving, but after treatment had worsened callus formation and hardening of the skin concerning to him for nonhealing wound. Has not had any fevers, chills, nausea, ting, increased pain or swelling, increased redness, drainage.  blood sugars have been better controlled recently.    Past Medical History  Diagnosis Date  . Depressed bipolar disorder ([place])   . Hypertension   . Diabetes mellitus without complication ([place])   . Sleep apnea   . Diabetic neuropathy [facility])    Past Surgical History  Procedure Laterality Date  . Spine surgery  2010  . Shoulder arthroscopy with rotator cuff repair Right 2009   Family History  Problem Relation Age of Onset  . Diabetes Mother   . Hyperlipidemia Mother   . Heart disease Father    Social History  Substance Use Topics  . Smoking status: Never Smoker   . Smokeless tobacco: None  . Alcohol Use: No    Review of Systems 10/14 systems reviewed and are negative other than those stated in the HPI    Allergies  Oxytetracycline and Sulfonamide derivatives  Home Medications   Prior to Admission medications   Medication Sig Start Date End Date Taking? Authorizing Provider  buPROPion (WELLBUTRIN SR) 150 MG 12 hr tablet Take 150 mg by mouth 2 (two) times daily.   Yes Historical Provider, MD  lisinopril-hydrochlorothiazide  (PRINZIDE,ZESTORETIC) 20-12.5 MG tablet Take 1 tablet by mouth daily. 10/17/14  Yes Lance Bosch, NP  metFORMIN (GLUCOPHAGE) 500 MG tablet Take 1 tablet (500 mg total) by mouth 2 (two) times daily with a meal. 10/17/14  Yes Lance Bosch, NP  methadone (DOLOPHINE) 10 MG/ML solution Take 185 mg by mouth daily.    Yes Historical Provider, MD  omeprazole (PRILOSEC) 20 MG capsule Take 1 capsule (20 mg total) by mouth 2 (two) times daily before a meal. Patient taking differently: Take 20 mg by mouth daily as needed (indigestion/acid reflux).  01/01/14  Yes Barton Dubois, MD  QUEtiapine (SEROQUEL XR) 300 MG 24 hr tablet Take 300 mg by mouth at bedtime.   Yes Historical Provider, MD  cephALEXin (KEFLEX) 500 MG capsule Take 1 capsule (500 mg total) by mouth 2 (two) times daily. Patient not taking: Reported on 10/28/2014 10/17/14   Lance Bosch, NP  ciprofloxacin (CIPRO) 750 MG tablet Take 1 tablet (750 mg total) by mouth 2 (two) times daily. One po bid x 7 days Patient not taking: Reported on 11/13/2014 10/28/14   Clayton Bibles, PA-C  clindamycin (CLEOCIN) 300 MG capsule Take 1 capsule (300 mg total) by mouth 4 (four) times daily. X 10 days Patient not taking: Reported on 11/13/2014 10/28/14   Clayton Bibles, PA-C  clobetasol (TEMOVATE) 0.05 % GEL Apply to affected area only Patient not taking: Reported on 11/13/2014 10/17/14   Lance Bosch, NP  clobetasol cream (TEMOVATE) 8.29 % Apply 1 application topically 2 (two) times daily. To  affected area only Patient not taking: Reported on 10/28/2014 10/17/14   Lance Bosch, NP  diclofenac (VOLTAREN) 75 MG EC tablet Take 1 tablet (75 mg total) by mouth 2 (two) times daily. Patient not taking: Reported on 10/28/2014 08/02/14   Waldemar Dickens, MD  gabapentin ([name]) 600 MG tablet TAKE 1 TABLET MOUTH 3 TIMES DAY Patient not taking: Reported on 11/13/2014 10/03/14   Lance Bosch, NP  methocarbamol (ROBAXIN) 500 MG tablet Take 1-2 tablets (500-1,000 mg total) by  mouth every 6 (six) hours as needed for muscle spasms. Patient not taking: Reported on 10/28/2014 08/02/14   Waldemar Dickens, MD  terbinafine (LAMISIL AT) 1 % cream Apply 1 application topically 2 (two) times daily. Patient not taking: Reported on 11/13/2014 10/17/14   Lance Bosch, NP   BP 121/75 mmHg  Pulse 64  Temp(Src) 98 F (36.7 C) (Oral)  Resp 16  Ht 6\' 2"  (1.88 m)  Wt 228 lb 12.8 oz (103.783 kg)  BMI 29.36 kg/m2  SpO2 98% Physical Exam Physical Exam  Nursing note and vitals reviewed. Constitutional: Well developed, well nourished, non-toxic, and in no acute distress Head: Normocephalic and atraumatic.  Mouth/Throat: Oropharynx is clear and moist.  Neck: Normal range of motion. Neck supple.  Cardiovascular: Normal rate and regular rhythm.   Pulmonary/Chest: Effort normal and breath sounds normal.  Abdominal: Soft. There is no tenderness. There is no rebound and no guarding.  Musculoskeletal: Normal range of motion.  Neurological: Alert, no facial droop, fluent speech, moves all extremities symmetrically Skin: Large callus noted over right great toe with minimal blanching erythema surrounding this. Wound is dry and there is no drainage. No significant tenderness. Mild swelling over lateral aspect of the right great toe. Psychiatric: Cooperative  ED Course  Procedures (including critical care time) Labs Review Labs Reviewed  CBC WITH DIFFERENTIAL/PLATELET - Abnormal; Notable for the following:    HCT 37.3 (*)    All other components within normal limits  BASIC METABOLIC PANEL - Abnormal; Notable for the following:    Chloride 97 (*)    Glucose, Bld 146 (*)    All other components within normal limits    Imaging Review Dg Foot Complete Right  11/13/2014  CLINICAL DATA:  First toe ulcer along the plantar aspect. EXAM: RIGHT FOOT COMPLETE - 3+ VIEW COMPARISON:  10/28/2014 FINDINGS: Increasing soft tissue swelling is noted about the first toe both medially and  inferiorly. No bony destruction to suggest underlying osteomyelitis is seen. No acute fracture is noted. The previously seen abnormality is not well appreciated on this study. IMPRESSION: Soft tissue abnormality without acute bony change. Electronically Signed   By: Inez Catalina M.D.   On: 11/13/2014 21:12   I have personally reviewed and evaluated these images and lab results as part of my medical decision-making.    MDM   Final diagnoses:  Diabetic ulcer of right great toe ([place])    In short, this is a 54 year old male with history of diabetes complicated by diabetic neuropathy who presents with evaluation of wound of his right great toe. He is well-appearing in no acute distress. Vital signs within normal limits. Blood work revealing no evidence of leukocytosis, and there is no concern for acute systemic illness. X-ray showing no bony involvement. On exam, he has wound over his right great toe, with a large callus formation primarily over the lateral aspect onto the plantar surface. There is minimal surrounding erythema that is blanching. When  compared to presentation 2 weeks ago in the ED, his wound appears better. I do not suspect significant persistent infection at this time.  He states that he has been given referral to podiatry for management of his callus/wound, which I encouraged him to follow up with. This time I do not think he requires any additional antibiotics. I did warn him that he may still develop infection involving this wound, and is given warning signs and srict return instructions. He expressed understanding of all discharge instructions and felt comfortable to plan of care.  Forde Dandy, MD 11/14/14 [phone]

## 2014-11-13 NOTE — Discharge Instructions (Signed)
Please follow-up with your foot doctor as soon as you can. Return for worsening symptoms, including worsening swelling, increased redness, drainage from wound, fevers, night sweats, or any other symptoms concerning to you.   Diabetes and Foot Care Diabetes may cause you to have problems because of poor blood supply (circulation) to your feet and legs. This may cause the skin on your feet to become thinner, break easier, and heal more slowly. Your skin may become dry, and the skin may peel and crack. You may also have nerve damage in your legs and feet causing decreased feeling in them. You may not notice minor injuries to your feet that could lead to infections or more serious problems. Taking care of your feet is one of the most important things you can do for yourself.  HOME CARE INSTRUCTIONS  Wear shoes at all times, even in the house. Do not go barefoot. Bare feet are easily injured.  Check your feet daily for blisters, cuts, and redness. If you cannot see the bottom of your feet, use a mirror or ask someone for help.  Wash your feet with warm water (do not use hot water) and mild soap. Then pat your feet and the areas between your toes until they are completely dry. Do not soak your feet as this can dry your skin.  Apply a moisturizing lotion or petroleum jelly (that does not contain alcohol and is unscented) to the skin on your feet and to dry, brittle toenails. Do not apply lotion between your toes.  Trim your toenails straight across. Do not dig under them or around the cuticle. File the edges of your nails with an emery board or nail file.  Do not cut corns or calluses or try to remove them with medicine.  Wear clean socks or stockings every day. Make sure they are not too tight. Do not wear knee-high stockings since they may decrease blood flow to your legs.  Wear shoes that fit properly and have enough cushioning. To break in new shoes, wear them for just a few hours a day. This  prevents you from injuring your feet. Always look in your shoes before you put them on to be sure there are no objects inside.  Do not cross your legs. This may decrease the blood flow to your feet.  If you find a minor scrape, cut, or break in the skin on your feet, keep it and the skin around it clean and dry. These areas may be cleansed with mild soap and water. Do not cleanse the area with peroxide, alcohol, or iodine.  When you remove an adhesive bandage, be sure not to damage the skin around it.  If you have a wound, look at it several times a day to make sure it is healing.  Do not use heating pads or hot water bottles. They may burn your skin. If you have lost feeling in your feet or legs, you may not know it is happening until it is too late.  Make sure your health care provider performs a complete foot exam at least annually or more often if you have foot problems. Report any cuts, sores, or bruises to your health care provider immediately. SEEK MEDICAL CARE IF:   You have an injury that is not healing.  You have cuts or breaks in the skin.  You have an ingrown nail.  You notice redness on your legs or feet.  You feel burning or tingling in your legs or feet.  You have pain or cramps in your legs and feet.  Your legs or feet are numb.  Your feet always feel cold. SEEK IMMEDIATE MEDICAL CARE IF:   There is increasing redness, swelling, or pain in or around a wound.  There is a red line that goes up your leg.  Pus is coming from a wound.  You develop a fever or as directed by your health care provider.  You notice a bad smell coming from an ulcer or wound.   This information is not intended to replace advice given to you by your health care provider. Make sure you discuss any questions you have with your health care provider.   Document Released: 12/28/1999 Document Revised: 09/01/2012 Document Reviewed: 06/08/2012 Elsevier Interactive Patient Education [employer]

## 2014-11-13 NOTE — ED Notes (Signed)
Pt reports ongoing R great toe infection. Pt has been tx with antibiotics but sts it is not getting better. Reports puss under skin but nothing draining.

## 2014-11-20 ENCOUNTER — Telehealth: Payer: Self-pay

## 2014-11-20 ENCOUNTER — Ambulatory Visit: Payer: Self-pay | Attending: Internal Medicine

## 2014-11-20 NOTE — Telephone Encounter (Signed)
Patient came in today requesting refills on his antibiotics  Patient states he still has infection in his toe Are you willing to refill this?

## 2014-11-22 NOTE — Telephone Encounter (Signed)
Further antibiotics are not needed. He needs to see podiatry as stated several times in office visit

## 2014-12-14 ENCOUNTER — Other Ambulatory Visit: Payer: Self-pay | Admitting: Pharmacist

## 2014-12-14 MED ORDER — TRUE METRIX METER W/DEVICE KIT: PACK | Status: DC

## 2014-12-14 MED ORDER — GLUCOSE BLOOD VI STRP: ORAL_STRIP | Status: DC

## 2014-12-31 ENCOUNTER — Encounter (HOSPITAL_COMMUNITY): Payer: Self-pay | Admitting: Emergency Medicine

## 2014-12-31 ENCOUNTER — Emergency Department (HOSPITAL_COMMUNITY)
Admission: EM | Admit: 2014-12-31 | Discharge: 2014-12-31 | Disposition: A | Discharge status: Home / Self Care | Payer: Self-pay | Attending: Emergency Medicine | Admitting: Emergency Medicine

## 2014-12-31 DIAGNOSIS — F3131 Bipolar disorder, current episode depressed, mild: Secondary | ICD-10-CM | POA: Insufficient documentation

## 2014-12-31 DIAGNOSIS — E119 Type 2 diabetes mellitus without complications: Secondary | ICD-10-CM | POA: Insufficient documentation

## 2014-12-31 DIAGNOSIS — Z79899 Other long term (current) drug therapy: Secondary | ICD-10-CM | POA: Insufficient documentation

## 2014-12-31 DIAGNOSIS — Y9389 Activity, other specified: Secondary | ICD-10-CM | POA: Insufficient documentation

## 2014-12-31 DIAGNOSIS — T148XXA Other injury of unspecified body region, initial encounter: Secondary | ICD-10-CM

## 2014-12-31 DIAGNOSIS — S91101A Unspecified open wound of right great toe without damage to nail, initial encounter: Secondary | ICD-10-CM | POA: Insufficient documentation

## 2014-12-31 DIAGNOSIS — Z8669 Personal history of other diseases of the nervous system and sense organs: Secondary | ICD-10-CM | POA: Insufficient documentation

## 2014-12-31 DIAGNOSIS — L97511 Non-pressure chronic ulcer of other part of right foot limited to breakdown of skin: Secondary | ICD-10-CM | POA: Insufficient documentation

## 2014-12-31 DIAGNOSIS — I10 Essential (primary) hypertension: Secondary | ICD-10-CM | POA: Insufficient documentation

## 2014-12-31 DIAGNOSIS — Y999 Unspecified external cause status: Secondary | ICD-10-CM | POA: Insufficient documentation

## 2014-12-31 DIAGNOSIS — X58XXXA Exposure to other specified factors, initial encounter: Secondary | ICD-10-CM | POA: Insufficient documentation

## 2014-12-31 DIAGNOSIS — Z79891 Long term (current) use of opiate analgesic: Secondary | ICD-10-CM | POA: Insufficient documentation

## 2014-12-31 DIAGNOSIS — Z792 Long term (current) use of antibiotics: Secondary | ICD-10-CM | POA: Insufficient documentation

## 2014-12-31 DIAGNOSIS — Y9289 Other specified places as the place of occurrence of the external cause: Secondary | ICD-10-CM | POA: Insufficient documentation

## 2014-12-31 MED ORDER — CLINDAMYCIN HCL 150 MG PO CAPS
300.0000 mg | ORAL_CAPSULE | Freq: Once | ORAL | Status: AC
  Administered 2014-12-31: 300 mg via ORAL
  Filled 2014-12-31: qty 2

## 2014-12-31 MED ORDER — CLINDAMYCIN HCL 300 MG PO CAPS: 300.0000 mg | ORAL_CAPSULE | Freq: Three times a day (TID) | ORAL | Status: DC

## 2014-12-31 NOTE — Discharge Instructions (Signed)
Wound Care °Taking care of your wound properly can help to prevent pain and infection. It can also help your wound to heal more quickly.  °HOW TO CARE FOR YOUR WOUND  °· Take or apply over-the-counter and prescription medicines only as told by your health care provider. °· If you were prescribed antibiotic medicine, take or apply it as told by your health care provider. Do not stop using the antibiotic even if your condition improves. °· Clean the wound each day or as told by your health care provider. °¨ Wash the wound with mild soap and water. °¨ Rinse the wound with water to remove all soap. °¨ Pat the wound dry with a clean towel. Do not rub it. °· There are many different ways to close and cover a wound. For example, a wound can be covered with stitches (sutures), skin glue, or adhesive strips. Follow instructions from your health care provider about: °¨ How to take care of your wound. °¨ When and how you should change your bandage (dressing). °¨ When you should remove your dressing. °¨ Removing whatever was used to close your wound. °· Check your wound every day for signs of infection. Watch for: °¨ Redness, swelling, or pain. °¨ Fluid, blood, or pus. °· Keep the dressing dry until your health care provider says it can be removed. Do not take baths, swim, use a hot tub, or do anything that would put your wound underwater until your health care provider approves. °· Raise (elevate) the injured area above the level of your heart while you are sitting or lying down. °· Do not scratch or pick at the wound. °· Keep all follow-up visits as told by your health care provider. This is important. °SEEK MEDICAL CARE IF: °· You received a tetanus shot and you have swelling, severe pain, redness, or bleeding at the injection site. °· You have a fever. °· Your pain is not controlled with medicine. °· You have increased redness, swelling, or pain at the site of your wound. °· You have fluid, blood, or pus coming from your  wound. °· You notice a bad smell coming from your wound or your dressing. °SEEK IMMEDIATE MEDICAL CARE IF: °· You have a red streak going away from your wound. °  °This information is not intended to replace advice given to you by your health care provider. Make sure you discuss any questions you have with your health care provider. °  °Document Released: 10/09/2007 Document Revised: 05/16/2014 Document Reviewed: 12/26/2013 °Elsevier Interactive Patient Education ©2016 Else[employer] Inc. ° °

## 2014-12-31 NOTE — ED Provider Notes (Addendum)
CSN: [id number]     Arrival date & time 12/31/14  1039 History   First MD Initiated Contact with Patient 12/31/14 1132     Chief Complaint  Patient presents with  . Wound Infection      HPI Patient presents to the emergency department with a chronic diabetic wound of his right great toe these been dealing with for several months.  He came the emergency department today because the callus that is formed on the medial aspect of the right great toe seems to be increasing in size and is causing slightly more pain.  He is a diabetic.  He is being managed at the [facility].  He has not seen an orthopedic surgeon.  He's concerned about the possibility of amputation of his toe if he does not get his toe treated appropriately.  Denies fevers and chills.  He denies spreading erythema.  He denies drainage from his toe.  No other complaints.   Past Medical History  Diagnosis Date  . Depressed bipolar disorder ([place])   . Hypertension   . Diabetes mellitus without complication ([place])   . Sleep apnea   . Diabetic neuropathy [facility])    Past Surgical History  Procedure Laterality Date  . Spine surgery  2010  . Shoulder arthroscopy with rotator cuff repair Right 2009   Family History  Problem Relation Age of Onset  . Diabetes Mother   . Hyperlipidemia Mother   . Heart disease Father    Social History  Substance Use Topics  . Smoking status: Never Smoker   . Smokeless tobacco: None  . Alcohol Use: No    Review of Systems  All other systems reviewed and are negative.     Allergies  Oxytetracycline and Sulfonamide derivatives  Home Medications   Prior to Admission medications   Medication Sig Start Date End Date Taking? Authorizing Provider  buPROPion (WELLBUTRIN SR) 150 MG 12 hr tablet Take 150 mg by mouth 2 (two) times daily.   Yes Historical Provider, MD  gabapentin ([name]) 600 MG tablet TAKE 1 TABLET MOUTH 3 TIMES DAY Patient taking differently: Take 600 mg by  mouth as needed for diabetic neuropathy. 10/03/14  Yes Lance Bosch, NP  lisinopril-hydrochlorothiazide (PRINZIDE,ZESTORETIC) 20-12.5 MG tablet Take 1 tablet by mouth daily. 10/17/14  Yes Lance Bosch, NP  metFORMIN (GLUCOPHAGE) 500 MG tablet Take 1 tablet (500 mg total) by mouth 2 (two) times daily with a meal. 10/17/14  Yes Lance Bosch, NP  methadone (DOLOPHINE) 10 MG/ML solution Take 70 mg by mouth daily.    Yes Historical Provider, MD  neomycin-bacitracin-polymyxin (NEOSPORIN) ointment Apply 1 application topically every 12 (twelve) hours. apply to eye   Yes Historical Provider, MD  omeprazole (PRILOSEC) 20 MG capsule Take 1 capsule (20 mg total) by mouth 2 (two) times daily before a meal. Patient taking differently: Take 20 mg by mouth daily as needed (indigestion/acid reflux).  01/01/14  Yes Barton Dubois, MD  QUEtiapine (SEROQUEL XR) 300 MG 24 hr tablet Take 300 mg by mouth at bedtime.   Yes Historical Provider, MD  cephALEXin (KEFLEX) 500 MG capsule Take 1 capsule (500 mg total) by mouth 2 (two) times daily. Patient not taking: Reported on 10/28/2014 10/17/14   Lance Bosch, NP  ciprofloxacin (CIPRO) 750 MG tablet Take 1 tablet (750 mg total) by mouth 2 (two) times daily. One po bid x 7 days Patient not taking: Reported on 11/13/2014 10/28/14   Clayton Bibles, PA-C  clindamycin (CLEOCIN) 300 MG capsule Take 1 capsule (300 mg total) by mouth 4 (four) times daily. X 10 days Patient not taking: Reported on 11/13/2014 10/28/14   Clayton Bibles, PA-C  clobetasol (TEMOVATE) 0.05 % GEL Apply to affected area only Patient not taking: Reported on 11/13/2014 10/17/14   Lance Bosch, NP  clobetasol cream (TEMOVATE) [id number] % Apply 1 application topically 2 (two) times daily. To affected area only Patient not taking: Reported on 10/28/2014 10/17/14   Lance Bosch, NP  diclofenac (VOLTAREN) 75 MG EC tablet Take 1 tablet (75 mg total) by mouth 2 (two) times daily. Patient not taking: Reported on 10/28/2014  08/02/14   Waldemar Dickens, MD  methocarbamol (ROBAXIN) 500 MG tablet Take 1-2 tablets (500-1,000 mg total) by mouth every 6 (six) hours as needed for muscle spasms. Patient not taking: Reported on 10/28/2014 08/02/14   Waldemar Dickens, MD  terbinafine (LAMISIL AT) 1 % cream Apply 1 application topically 2 (two) times daily. Patient not taking: Reported on 11/13/2014 10/17/14   Lance Bosch, NP   BP 121/79 mmHg  Pulse 72  Temp(Src) 98.6 F (37 C) (Oral)  Resp 18  Ht 6\' 1"  (1.854 m)  Wt 235 lb (106.595 kg)  BMI 31.01 kg/m2  SpO2 94% Physical Exam  Constitutional: He is oriented to person, place, and time. He appears well-developed and well-nourished.  HENT:  Head: Normocephalic.  Eyes: EOM are normal.  Neck: Normal range of motion.  Pulmonary/Chest: Effort normal.  Abdominal: He exhibits no distension.  Musculoskeletal: Normal range of motion.  Chronic wound appearance of his right great toe on the medial aspect with a large callus which was always still attached to the skin at the superior aspect.  Underlying the calluses fibrinous tissue without fluctuance or drainage.  There is a small amount of erythema of the wound edges without spreading erythema.  Able to flex his right great toe.  No significant swelling of the right great toe as compared to his left great toe.  Neurological: He is alert and oriented to person, place, and time.  Psychiatric: He has a normal mood and affect.  Nursing note and vitals reviewed.   ED Course  Procedures (including critical care time)  Debridement Performed by: Hoy Morn Consent: Verbal consent obtained. Risks and benefits: risks, benefits and alternatives were discussed Time out performed prior to procedure Type: chronic callous Body area: right great toe Anesthesia: none Complexity: simple Drainage: none Packing material: none Removed with scissors Patient tolerance: Patient tolerated the procedure well with no immediate  complications.     Labs Review Labs Reviewed - No data to display  Imaging Review No results found. I have personally reviewed and evaluated these images and lab results as part of my medical decision-making.   EKG Interpretation None      MDM   Final diagnoses:  Nonhealing nonsurgical wound limited to breakdown of skin    Chronic callus on the right great toe is debrided.  This was removed without complication.  The underlying tissue appears chronically inflamed with lots of fibrous tissue.  There is no drainage or fluctuance to this area.  There is a small amount of surrounding erythema.  Discharge home with clindamycin.  Instructions to follow-up with his primary care physician.  He is scheduled in the near future to follow-up with a podiatrist as well as his insurance status become solidified.  Patient understands return the emergency department for new or worsening symptoms.  Infection  warnings given.  Patient instructed in wound care.  He was recommended to do dialysis soaps soaks twice a day.  He'll return to the ER for infection symptoms which were discussed with him at length.   Jola Schmidt, MD 12/31/14 [place], MD 12/31/14 [phone]

## 2014-12-31 NOTE — ED Notes (Signed)
Dr. Venora Maples at bedside with suture cart.

## 2014-12-31 NOTE — ED Notes (Signed)
Pt c/o toe wound infection ongoing for 3 months. Area began as a callous. Pt is diabetic.

## 2015-01-01 ENCOUNTER — Ambulatory Visit: Payer: Self-pay | Attending: Internal Medicine

## 2015-01-12 ENCOUNTER — Ambulatory Visit: Payer: Self-pay | Admitting: Podiatry

## 2015-01-26 ENCOUNTER — Ambulatory Visit: Payer: No Typology Code available for payment source

## 2015-01-26 ENCOUNTER — Encounter: Payer: Self-pay | Admitting: Podiatry

## 2015-01-26 ENCOUNTER — Ambulatory Visit (INDEPENDENT_AMBULATORY_CARE_PROVIDER_SITE_OTHER): Payer: No Typology Code available for payment source | Admitting: Podiatry

## 2015-01-26 VITALS — BP 170/103 | HR 71 | Resp 16 | Ht 74.0 in | Wt 245.0 lb

## 2015-01-26 DIAGNOSIS — G629 Polyneuropathy, unspecified: Secondary | ICD-10-CM

## 2015-01-26 DIAGNOSIS — M79674 Pain in right toe(s): Secondary | ICD-10-CM

## 2015-01-26 DIAGNOSIS — M79675 Pain in left toe(s): Secondary | ICD-10-CM

## 2015-01-26 DIAGNOSIS — B351 Tinea unguium: Secondary | ICD-10-CM

## 2015-01-26 NOTE — Progress Notes (Signed)
   Subjective:    Patient ID: CAELEN NYBORG, male    DOB: 07-May-1960, 55 y.o.   MRN: [id number]  HPI Patient presents with a nail problem on their Left foot, great toe; loose nail; x4-5 days.  Patient also presents with a wound check on their Right foot; great toe; x4 months  Pt Diabetic Type 2; sugar=did not take today; A1C=7.5. Pt has neuropathy  Review of Systems  Constitutional: Positive for activity change.  Cardiovascular: Positive for leg swelling.  Endocrine: Positive for polyuria.  Genitourinary: Positive for frequency.  Musculoskeletal: Positive for myalgias, back pain, arthralgias and gait problem.  Skin: Positive for color change and wound.  Neurological: Positive for weakness and numbness.  Hematological: Bruises/bleeds easily.  All other systems reviewed and are negative.      Objective:   Physical Exam        Assessment & Plan:

## 2015-01-26 NOTE — Patient Instructions (Signed)

## 2015-01-28 NOTE — Progress Notes (Signed)
Subjective:     Patient ID: [name] [name], male   DOB: April 21, 1960, 55 y.o.   MRN: [id number]  HPI patient states his long-term diabetic with keratotic lesion on his right big toe that he wanted checked and nail disease   Review of Systems     Objective:   Physical Exam Neurovascular status was found to be diminished but intact with digital perfusion that's adequate and hair growth. He has keratotic lesion on the right hallux that's localized in nature with no drainage    Assessment:     Lesion secondary to excessive pressure    Plan:     Debride lesion applied padding and an instructed on diabetic care. Debrided nails and reappoint as needed

## 2015-02-08 ENCOUNTER — Ambulatory Visit (HOSPITAL_COMMUNITY)
Admission: RE | Admit: 2015-02-08 | Discharge: 2015-02-08 | Disposition: A | Discharge status: Home / Self Care | Payer: Self-pay | Attending: Psychiatry | Admitting: Psychiatry

## 2015-02-08 DIAGNOSIS — Z882 Allergy status to sulfonamides status: Secondary | ICD-10-CM | POA: Insufficient documentation

## 2015-02-08 DIAGNOSIS — F1123 Opioid dependence with withdrawal: Secondary | ICD-10-CM | POA: Insufficient documentation

## 2015-02-08 DIAGNOSIS — Z888 Allergy status to other drugs, medicaments and biological substances status: Secondary | ICD-10-CM | POA: Insufficient documentation

## 2015-02-08 DIAGNOSIS — F3181 Bipolar II disorder: Secondary | ICD-10-CM | POA: Insufficient documentation

## 2015-02-08 NOTE — BH Assessment (Signed)
Tele Assessment Note   [name] [name] is an 55 y.o. male. Pt was a walk-in. Pt requests opiate detox. Pt states "I may harm myself if I go through withdrawals." Pt denies HI.Pt denies AVH. Pt states "I've been addicted to opiates all of my life." Pt has been receiving Methadone for the past 4 years. Pt states he can no longer afford his Methadone so he is going through withdrawals. Pt states he is terrified of going through withdrawals. Pt reports previous hospitalization for SI and withdrawal symptoms. Pt denies other drug use. Pt denies alcohol use. Pt states he lives with his parents because he is not financially stable.   Writer consulted with Heloise Purpura, DNP. Per Heloise Purpura Pt does not meet inpatient criteria. Pt provided with multiple detox and inpatient SA information.   Diagnosis:  F11.20 Opioid use, Severe; F31.81 Bipolar  Past Medical History:  Past Medical History  Diagnosis Date  . Depressed bipolar disorder ([place])   . Hypertension   . Diabetes mellitus without complication ([place])   . Sleep apnea   . Diabetic neuropathy [facility])     Past Surgical History  Procedure Laterality Date  . Spine surgery  2010  . Shoulder arthroscopy with rotator cuff repair Right 2009    Family History:  Family History  Problem Relation Age of Onset  . Diabetes Mother   . Hyperlipidemia Mother   . Heart disease Father     Social History:  reports that he has never smoked. He does not have any smokeless tobacco history on file. He reports that he does not drink alcohol or use illicit drugs.  Additional Social History:  Alcohol / Drug Use Pain Medications: Methadone Prescriptions: Pt denies Over the Counter: Pt denies History of alcohol / drug use?: Yes Longest period of sobriety (when/how long): unknown Negative Consequences of Use: Financial, Legal, Personal relationships, Work / School Withdrawal Symptoms: Agitation, Aggressive/Assaultive, Sweats, Nausea / Vomiting, Weakness Substance  #1 Name of Substance 1: Opiates 1 - Age of First Use: unknown 1 - Amount (size/oz): "as much as I can get." 1 - Frequency: daily 1 - Duration: ongoing 1 - Last Use / Amount: 02/08/15  CIWA:   COWS:    PATIENT STRENGTHS: (choose at least two) Capable of independent living Communication skills  Allergies:  Allergies  Allergen Reactions  . Oxytetracycline Rash  . Sulfonamide Derivatives Rash    Home Medications:  (Not in a hospital admission)  OB/GYN Status:  No LMP for male patient.  General Assessment Data Location of Assessment: [facility] Assessment Services TTS Assessment: In system Is this a Tele or Face-to-Face Assessment?: Face-to-Face Is this an Initial Assessment or a Re-assessment for this encounter?: Initial Assessment Marital status: Separated Maiden name: NA Is patient pregnant?: No Pregnancy Status: No Living Arrangements: Parent Can pt return to current living arrangement?: Yes Admission Status: Voluntary Is patient capable of signing voluntary admission?: Yes Referral Source: Self/Family/Friend Insurance type: SP     Crisis Care Plan Living Arrangements: Parent Legal Guardian: Other: (self) Name of Psychiatrist: Warden/ranger Name of Therapist: [facility]  Education Status Is patient currently in school?: No Current Grade: NA Highest grade of school patient has completed: some college Name of school: NA Contact person: NA  Risk to self with the past 6 months Suicidal Ideation: No-Not Currently/Within Last 6 Months Has patient been a risk to self within the past 6 months prior to admission? : Yes Suicidal Intent: No Has patient had any suicidal intent within the past 6  months prior to admission? : No Is patient at risk for suicide?: Yes Suicidal Plan?: No Has patient had any suicidal plan within the past 6 months prior to admission? : No Access to Means: Yes Specify Access to Suicidal Means: access to opiates What has been your use of drugs/alcohol  within the last 12 months?: opiates Previous Attempts/Gestures: No How many times?: 0 Other Self Harm Risks: NA Triggers for Past Attempts: None known Intentional Self Injurious Behavior: None Family Suicide History: No Recent stressful life event(s): Job Loss, Financial Problems, Legal Issues Persecutory voices/beliefs?: No Depression: Yes Depression Symptoms: Tearfulness, Feeling worthless/self pity, Loss of interest in usual pleasures, Feeling angry/irritable, Isolating Substance abuse history and/or treatment for substance abuse?: No Suicide prevention information given to non-admitted patients: Not applicable  Risk to Others within the past 6 months Homicidal Ideation: No Does patient have any lifetime risk of violence toward others beyond the six months prior to admission? : No Thoughts of Harm to Others: No Current Homicidal Intent: No Current Homicidal Plan: No Access to Homicidal Means: No Identified Victim: NA History of harm to others?: No Assessment of Violence: None Noted Violent Behavior Description: NA Does patient have access to weapons?: No Criminal Charges Pending?: No Does patient have a court date: No Is patient on probation?: No  Psychosis Hallucinations: None noted Delusions: None noted  Mental Status Report Appearance/Hygiene: Disheveled Eye Contact: Fair Motor Activity: Freedom of movement Speech: Logical/coherent Level of Consciousness: Alert Mood: Depressed, Sad Affect: Depressed, Sad Anxiety Level: Minimal Thought Processes: Coherent, Relevant Judgement: Unimpaired Orientation: Person, Time, Place, Situation, Appropriate for developmental age Obsessive Compulsive Thoughts/Behaviors: None  Cognitive Functioning Concentration: Normal Memory: Recent Intact, Remote Intact IQ: Average Insight: Poor Impulse Control: Fair Appetite: Poor Weight Loss: 0 Weight Gain: 0 Sleep: Decreased Total Hours of Sleep: 5 Vegetative Symptoms:  None  ADLScreening [facility] Assessment Services) Patient's cognitive ability adequate to safely complete daily activities?: Yes Patient able to express need for assistance with ADLs?: Yes Independently performs ADLs?: Yes (appropriate for developmental age)  Prior Inpatient Therapy Prior Inpatient Therapy: Yes Prior Therapy Dates: 2016 Prior Therapy Facilty/Provider(s): [facility] Reason for Treatment: SI  Prior Outpatient Therapy Prior Outpatient Therapy: Yes Prior Therapy Dates: 2017 Prior Therapy Facilty/Provider(s): [facility] Reason for Treatment: SI, detox Does patient have an ACCT team?: No Does patient have Intensive In-House Services?  : No Does patient have [facility] services? : No Does patient have P4CC services?: No  ADL Screening (condition at time of admission) Patient's cognitive ability adequate to safely complete daily activities?: Yes Is the patient deaf or have difficulty hearing?: No Does the patient have difficulty seeing, even when wearing glasses/contacts?: No Does the patient have difficulty concentrating, remembering, or making decisions?: No Patient able to express need for assistance with ADLs?: Yes Does the patient have difficulty dressing or bathing?: No Independently performs ADLs?: Yes (appropriate for developmental age) Does the patient have difficulty walking or climbing stairs?: No Weakness of Legs: None Weakness of Arms/Hands: None       Abuse/Neglect Assessment (Assessment to be complete while patient is alone) Physical Abuse: Denies Verbal Abuse: Denies Sexual Abuse: Denies Exploitation of patient/patient's resources: Denies Self-Neglect: Denies Values / Beliefs Cultural Requests During Hospitalization: None Spiritual Requests During Hospitalization: None Consults Spiritual Care Consult Needed: No Social Work Consult Needed: No Regulatory affairs officer (For Healthcare) Does patient have an advance directive?: No Would patient like information on  creating an advanced directive?: No - patient declined information    Additional Information  1:1 In Past 12 Months?: No CIRT Risk: No Elopement Risk: No Does patient have medical clearance?: No     Disposition:  Disposition Initial Assessment Completed for this Encounter: Yes  Sakari Alkhatib D 02/08/2015 9:23 AM

## 2015-02-12 MED FILL — GABAPENTIN 600 MG TABLET: 600 | 30 days supply | Qty: 90 | Fill #2

## 2015-02-12 MED FILL — metFORMIN HCL 500 MG TABS: 500 | 30 days supply | Qty: 60 | Fill #2

## 2015-02-12 MED FILL — LISINOPRIL-HCTZ 20-12.5 MG: 20-12.5 | 30 days supply | Qty: 30 | Fill #1

## 2015-02-18 ENCOUNTER — Encounter (HOSPITAL_COMMUNITY): Payer: Self-pay

## 2015-02-18 ENCOUNTER — Inpatient Hospital Stay (HOSPITAL_COMMUNITY): Payer: Self-pay

## 2015-02-18 ENCOUNTER — Inpatient Hospital Stay (HOSPITAL_COMMUNITY): Payer: MEDICAID

## 2015-02-18 ENCOUNTER — Inpatient Hospital Stay (HOSPITAL_COMMUNITY)
Admission: EM | Admit: 2015-02-18 | Discharge: 2015-03-13 | DRG: 917 | Disposition: A | Discharge status: Home / Self Care | Payer: Self-pay | Attending: Internal Medicine | Admitting: Internal Medicine

## 2015-02-18 DIAGNOSIS — G4733 Obstructive sleep apnea (adult) (pediatric): Secondary | ICD-10-CM | POA: Diagnosis present

## 2015-02-18 DIAGNOSIS — A4901 Methicillin susceptible Staphylococcus aureus infection, unspecified site: Secondary | ICD-10-CM | POA: Diagnosis not present

## 2015-02-18 DIAGNOSIS — D7582 Heparin induced thrombocytopenia (HIT): Secondary | ICD-10-CM | POA: Diagnosis not present

## 2015-02-18 DIAGNOSIS — F3130 Bipolar disorder, current episode depressed, mild or moderate severity, unspecified: Secondary | ICD-10-CM | POA: Diagnosis present

## 2015-02-18 DIAGNOSIS — D649 Anemia, unspecified: Secondary | ICD-10-CM | POA: Diagnosis not present

## 2015-02-18 DIAGNOSIS — E874 Mixed disorder of acid-base balance: Secondary | ICD-10-CM | POA: Diagnosis present

## 2015-02-18 DIAGNOSIS — T50902A Poisoning by unspecified drugs, medicaments and biological substances, intentional self-harm, initial encounter: Secondary | ICD-10-CM

## 2015-02-18 DIAGNOSIS — Z7984 Long term (current) use of oral hypoglycemic drugs: Secondary | ICD-10-CM

## 2015-02-18 DIAGNOSIS — J9600 Acute respiratory failure, unspecified whether with hypoxia or hypercapnia: Secondary | ICD-10-CM

## 2015-02-18 DIAGNOSIS — J939 Pneumothorax, unspecified: Secondary | ICD-10-CM

## 2015-02-18 DIAGNOSIS — N170 Acute kidney failure with tubular necrosis: Secondary | ICD-10-CM | POA: Diagnosis present

## 2015-02-18 DIAGNOSIS — F1120 Opioid dependence, uncomplicated: Secondary | ICD-10-CM | POA: Diagnosis present

## 2015-02-18 DIAGNOSIS — Z79891 Long term (current) use of opiate analgesic: Secondary | ICD-10-CM

## 2015-02-18 DIAGNOSIS — Y95 Nosocomial condition: Secondary | ICD-10-CM | POA: Diagnosis not present

## 2015-02-18 DIAGNOSIS — Y92003 Bedroom of unspecified non-institutional (private) residence as the place of occurrence of the external cause: Secondary | ICD-10-CM

## 2015-02-18 DIAGNOSIS — T43591A Poisoning by other antipsychotics and neuroleptics, accidental (unintentional), initial encounter: Secondary | ICD-10-CM | POA: Diagnosis present

## 2015-02-18 DIAGNOSIS — G8929 Other chronic pain: Secondary | ICD-10-CM | POA: Diagnosis present

## 2015-02-18 DIAGNOSIS — J189 Pneumonia, unspecified organism: Secondary | ICD-10-CM | POA: Diagnosis not present

## 2015-02-18 DIAGNOSIS — G9340 Encephalopathy, unspecified: Secondary | ICD-10-CM

## 2015-02-18 DIAGNOSIS — Z4659 Encounter for fitting and adjustment of other gastrointestinal appliance and device: Secondary | ICD-10-CM

## 2015-02-18 DIAGNOSIS — Z452 Encounter for adjustment and management of vascular access device: Secondary | ICD-10-CM

## 2015-02-18 DIAGNOSIS — J9601 Acute respiratory failure with hypoxia: Secondary | ICD-10-CM

## 2015-02-18 DIAGNOSIS — L8990 Pressure ulcer of unspecified site, unspecified stage: Secondary | ICD-10-CM | POA: Insufficient documentation

## 2015-02-18 DIAGNOSIS — T43592A Poisoning by other antipsychotics and neuroleptics, intentional self-harm, initial encounter: Secondary | ICD-10-CM | POA: Diagnosis present

## 2015-02-18 DIAGNOSIS — E1165 Type 2 diabetes mellitus with hyperglycemia: Secondary | ICD-10-CM | POA: Diagnosis present

## 2015-02-18 DIAGNOSIS — F319 Bipolar disorder, unspecified: Secondary | ICD-10-CM | POA: Diagnosis present

## 2015-02-18 DIAGNOSIS — M549 Dorsalgia, unspecified: Secondary | ICD-10-CM | POA: Diagnosis present

## 2015-02-18 DIAGNOSIS — T383X5A Adverse effect of insulin and oral hypoglycemic [antidiabetic] drugs, initial encounter: Secondary | ICD-10-CM | POA: Diagnosis present

## 2015-02-18 DIAGNOSIS — T464X2A Poisoning by angiotensin-converting-enzyme inhibitors, intentional self-harm, initial encounter: Secondary | ICD-10-CM | POA: Diagnosis present

## 2015-02-18 DIAGNOSIS — X789XXA Intentional self-harm by unspecified sharp object, initial encounter: Secondary | ICD-10-CM | POA: Diagnosis present

## 2015-02-18 DIAGNOSIS — T383X2A Poisoning by insulin and oral hypoglycemic [antidiabetic] drugs, intentional self-harm, initial encounter: Principal | ICD-10-CM | POA: Diagnosis present

## 2015-02-18 DIAGNOSIS — I10 Essential (primary) hypertension: Secondary | ICD-10-CM | POA: Diagnosis present

## 2015-02-18 DIAGNOSIS — I959 Hypotension, unspecified: Secondary | ICD-10-CM

## 2015-02-18 DIAGNOSIS — J9690 Respiratory failure, unspecified, unspecified whether with hypoxia or hypercapnia: Secondary | ICD-10-CM | POA: Insufficient documentation

## 2015-02-18 DIAGNOSIS — Z79899 Other long term (current) drug therapy: Secondary | ICD-10-CM

## 2015-02-18 DIAGNOSIS — Z515 Encounter for palliative care: Secondary | ICD-10-CM | POA: Insufficient documentation

## 2015-02-18 DIAGNOSIS — S61512A Laceration without foreign body of left wrist, initial encounter: Secondary | ICD-10-CM | POA: Diagnosis present

## 2015-02-18 DIAGNOSIS — Z6838 Body mass index (BMI) 38.0-38.9, adult: Secondary | ICD-10-CM

## 2015-02-18 DIAGNOSIS — R579 Shock, unspecified: Secondary | ICD-10-CM

## 2015-02-18 DIAGNOSIS — E869 Volume depletion, unspecified: Secondary | ICD-10-CM | POA: Diagnosis not present

## 2015-02-18 DIAGNOSIS — R34 Anuria and oliguria: Secondary | ICD-10-CM | POA: Diagnosis present

## 2015-02-18 DIAGNOSIS — T426X2A Poisoning by other antiepileptic and sedative-hypnotic drugs, intentional self-harm, initial encounter: Secondary | ICD-10-CM | POA: Diagnosis present

## 2015-02-18 DIAGNOSIS — E872 Acidosis, unspecified: Secondary | ICD-10-CM

## 2015-02-18 DIAGNOSIS — T50901A Poisoning by unspecified drugs, medicaments and biological substances, accidental (unintentional), initial encounter: Secondary | ICD-10-CM | POA: Diagnosis present

## 2015-02-18 DIAGNOSIS — R791 Abnormal coagulation profile: Secondary | ICD-10-CM | POA: Diagnosis present

## 2015-02-18 DIAGNOSIS — E876 Hypokalemia: Secondary | ICD-10-CM | POA: Diagnosis not present

## 2015-02-18 DIAGNOSIS — F419 Anxiety disorder, unspecified: Secondary | ICD-10-CM | POA: Diagnosis not present

## 2015-02-18 DIAGNOSIS — R109 Unspecified abdominal pain: Secondary | ICD-10-CM

## 2015-02-18 DIAGNOSIS — R0602 Shortness of breath: Secondary | ICD-10-CM

## 2015-02-18 DIAGNOSIS — G4700 Insomnia, unspecified: Secondary | ICD-10-CM | POA: Diagnosis not present

## 2015-02-18 DIAGNOSIS — N179 Acute kidney failure, unspecified: Secondary | ICD-10-CM

## 2015-02-18 DIAGNOSIS — E1140 Type 2 diabetes mellitus with diabetic neuropathy, unspecified: Secondary | ICD-10-CM | POA: Diagnosis present

## 2015-02-18 LAB — COMPREHENSIVE METABOLIC PANEL
ALT: 32 U/L (ref 17–63)
AST: 31 U/L (ref 15–41)
Albumin: 3.9 g/dL (ref 3.5–5.0)
Alkaline Phosphatase: 46 U/L (ref 38–126)
Anion gap: 31 — ABNORMAL HIGH (ref 5–15)
BUN: 29 mg/dL — ABNORMAL HIGH (ref 6–20)
CO2: 10 mmol/L — ABNORMAL LOW (ref 22–32)
Calcium: 9.4 mg/dL (ref 8.9–10.3)
Chloride: 97 mmol/L — ABNORMAL LOW (ref 101–111)
Creatinine, Ser: 3.48 mg/dL — ABNORMAL HIGH (ref 0.61–1.24)
GFR calc Af Amer: 22 mL/min — ABNORMAL LOW (ref >60)
GFR calc non Af Amer: 19 mL/min — ABNORMAL LOW (ref >60)
Glucose, Bld: 278 mg/dL — ABNORMAL HIGH (ref 65–99)
Potassium: 4.8 mmol/L (ref 3.5–5.1)
Sodium: 138 mmol/L (ref 135–145)
Total Bilirubin: 0.9 mg/dL (ref 0.3–1.2)
Total Protein: 6.7 g/dL (ref 6.5–8.1)

## 2015-02-18 LAB — POCT I-STAT 3, ART BLOOD GAS (G3+)
Acid-base deficit: 25 mmol/L — ABNORMAL HIGH (ref 0.0–2.0)
Acid-base deficit: 27 mmol/L — ABNORMAL HIGH (ref 0.0–2.0)
Acid-base deficit: 24 mmol/L — ABNORMAL HIGH (ref 0.0–2.0)
Acid-base deficit: 22 mmol/L — ABNORMAL HIGH (ref 0.0–2.0)
Bicarbonate: 6.2 mEq/L — ABNORMAL LOW (ref 20.0–24.0)
Bicarbonate: 3.4 mEq/L — ABNORMAL LOW (ref 20.0–24.0)
Bicarbonate: 4.9 mEq/L — ABNORMAL LOW (ref 20.0–24.0)
Bicarbonate: 6.2 mEq/L — ABNORMAL LOW (ref 20.0–24.0)
O2 Saturation: 100 %
O2 Saturation: 99 %
O2 Saturation: 99 %
O2 Saturation: 99 %
Patient temperature: 98.6
Patient temperature: 37.4
Patient temperature: 36.8
Patient temperature: 35.5
TCO2: 7 mmol/L (ref 0–100)
TCO2: <5 mmol/L (ref 0–100)
TCO2: 5 mmol/L (ref 0–100)
TCO2: 7 mmol/L (ref 0–100)
pCO2 arterial: 28.6 mmHg — ABNORMAL LOW (ref 35.0–45.0)
pCO2 arterial: 15.4 mmHg — CL (ref 35.0–45.0)
pCO2 arterial: 18.5 mmHg — CL (ref 35.0–45.0)
pCO2 arterial: 19.1 mmHg — CL (ref 35.0–45.0)
pH, Arterial: 6.944 — CL (ref 7.350–7.450)
pH, Arterial: 6.954 — CL (ref 7.350–7.450)
pH, Arterial: 7.026 — CL (ref 7.350–7.450)
pH, Arterial: 7.11 — CL (ref 7.350–7.450)
pO2, Arterial: 484 mmHg — ABNORMAL HIGH (ref 80.0–100.0)
pO2, Arterial: 194 mmHg — ABNORMAL HIGH (ref 80.0–100.0)
pO2, Arterial: 209 mmHg — ABNORMAL HIGH (ref 80.0–100.0)
pO2, Arterial: 204 mmHg — ABNORMAL HIGH (ref 80.0–100.0)

## 2015-02-18 LAB — BLOOD GAS, ARTERIAL
Acid-base deficit: 23.5 mmol/L — ABNORMAL HIGH (ref 0.0–2.0)
Bicarbonate: 3.9 mEq/L — ABNORMAL LOW (ref 20.0–24.0)
Drawn by: 283401
FIO2: 0.4
O2 Saturation: 99.2 %
PEEP: 5 cmH2O
Patient temperature: 98.6
Pressure control: 20 cmH2O
RATE: 25 resp/min
TCO2: 4.2 mmol/L (ref 0–100)
pCO2 arterial: 10.2 mmHg — CL (ref 35.0–45.0)
pH, Arterial: 7.203 — ABNORMAL LOW (ref 7.350–7.450)
pO2, Arterial: 214 mmHg — ABNORMAL HIGH (ref 80.0–100.0)

## 2015-02-18 LAB — RAPID URINE DRUG SCREEN, HOSP PERFORMED
Amphetamines: NOT DETECTED
Barbiturates: POSITIVE — AB
Benzodiazepines: NOT DETECTED
Cocaine: NOT DETECTED
Opiates: NOT DETECTED
Tetrahydrocannabinol: NOT DETECTED

## 2015-02-18 LAB — LACTIC ACID, PLASMA
Lactic Acid, Venous: 14.5 mmol/L (ref 0.5–2.0)
Lactic Acid, Venous: 15.6 mmol/L (ref 0.5–2.0)
Lactic Acid, Venous: 14.7 mmol/L (ref 0.5–2.0)
Lactic Acid, Venous: 14.5 mmol/L (ref 0.5–2.0)

## 2015-02-18 LAB — CBC
HCT: 38.2 % — ABNORMAL LOW (ref 39.0–52.0)
HCT: 34.8 % — ABNORMAL LOW (ref 39.0–52.0)
Hemoglobin: 12.6 g/dL — ABNORMAL LOW (ref 13.0–17.0)
Hemoglobin: 11.7 g/dL — ABNORMAL LOW (ref 13.0–17.0)
MCH: 27.9 pg (ref 26.0–34.0)
MCH: 28.1 pg (ref 26.0–34.0)
MCHC: 33 g/dL (ref 30.0–36.0)
MCHC: 33.6 g/dL (ref 30.0–36.0)
MCV: 84.5 fL (ref 78.0–100.0)
MCV: 83.5 fL (ref 78.0–100.0)
Platelets: 159 10*3/uL (ref 150–400)
Platelets: 218 10*3/uL (ref 150–400)
RBC: 4.52 MIL/uL (ref 4.22–5.81)
RBC: 4.17 MIL/uL — ABNORMAL LOW (ref 4.22–5.81)
RDW: 14.1 % (ref 11.5–15.5)
RDW: 14 % (ref 11.5–15.5)
WBC: 9.1 10*3/uL (ref 4.0–10.5)
WBC: 27.5 10*3/uL — ABNORMAL HIGH (ref 4.0–10.5)

## 2015-02-18 LAB — HEPATIC FUNCTION PANEL
ALT: 23 U/L (ref 17–63)
AST: 34 U/L (ref 15–41)
Albumin: 2.4 g/dL — ABNORMAL LOW (ref 3.5–5.0)
Alkaline Phosphatase: 35 U/L — ABNORMAL LOW (ref 38–126)
Bilirubin, Direct: 0.1 mg/dL (ref 0.1–0.5)
Indirect Bilirubin: 0.9 mg/dL (ref 0.3–0.9)
Total Bilirubin: 1 mg/dL (ref 0.3–1.2)
Total Protein: 4.5 g/dL — ABNORMAL LOW (ref 6.5–8.1)

## 2015-02-18 LAB — CBC WITH DIFFERENTIAL/PLATELET
Basophils Absolute: 0 10*3/uL (ref 0.0–0.1)
Basophils Relative: 0 %
Eosinophils Absolute: 0 10*3/uL (ref 0.0–0.7)
Eosinophils Relative: 0 %
HCT: 41.6 % (ref 39.0–52.0)
Hemoglobin: 14.2 g/dL (ref 13.0–17.0)
Lymphocytes Relative: 15 %
Lymphs Abs: 1.4 10*3/uL (ref 0.7–4.0)
MCH: 27.8 pg (ref 26.0–34.0)
MCHC: 34.1 g/dL (ref 30.0–36.0)
MCV: 81.6 fL (ref 78.0–100.0)
Monocytes Absolute: 0.4 10*3/uL (ref 0.1–1.0)
Monocytes Relative: 4 %
Neutro Abs: 7.6 10*3/uL (ref 1.7–7.7)
Neutrophils Relative %: 81 %
Platelets: 182 10*3/uL (ref 150–400)
RBC: 5.1 MIL/uL (ref 4.22–5.81)
RDW: 13.9 % (ref 11.5–15.5)
WBC: 9.5 10*3/uL (ref 4.0–10.5)

## 2015-02-18 LAB — RENAL FUNCTION PANEL
Albumin: 2.8 g/dL — ABNORMAL LOW (ref 3.5–5.0)
BUN: 40 mg/dL — ABNORMAL HIGH (ref 6–20)
CO2: <7 mmol/L — ABNORMAL LOW (ref 22–32)
Calcium: 7.2 mg/dL — ABNORMAL LOW (ref 8.9–10.3)
Chloride: 103 mmol/L (ref 101–111)
Creatinine, Ser: 4.83 mg/dL — ABNORMAL HIGH (ref 0.61–1.24)
GFR calc Af Amer: 15 mL/min — ABNORMAL LOW (ref >60)
GFR calc non Af Amer: 13 mL/min — ABNORMAL LOW (ref >60)
Glucose, Bld: 468 mg/dL — ABNORMAL HIGH (ref 65–99)
Phosphorus: 14.5 mg/dL — ABNORMAL HIGH (ref 2.5–4.6)
Potassium: 5.7 mmol/L — ABNORMAL HIGH (ref 3.5–5.1)
Sodium: 143 mmol/L (ref 135–145)

## 2015-02-18 LAB — I-STAT ARTERIAL BLOOD GAS, ED
Acid-base deficit: 21 mmol/L — ABNORMAL HIGH (ref 0.0–2.0)
Bicarbonate: 6.7 mEq/L — ABNORMAL LOW (ref 20.0–24.0)
O2 Saturation: 98 %
Patient temperature: 36.3
TCO2: 7 mmol/L (ref 0–100)
pCO2 arterial: 20.7 mmHg — ABNORMAL LOW (ref 35.0–45.0)
pH, Arterial: 7.113 — CL (ref 7.350–7.450)
pO2, Arterial: 134 mmHg — ABNORMAL HIGH (ref 80.0–100.0)

## 2015-02-18 LAB — ACETAMINOPHEN LEVEL: Acetaminophen (Tylenol), Serum: <10 ug/mL — ABNORMAL LOW (ref 10–30)

## 2015-02-18 LAB — PROTIME-INR
INR: 1.98 — ABNORMAL HIGH (ref 0.00–1.49)
Prothrombin Time: 22.4 seconds — ABNORMAL HIGH (ref 11.6–15.2)

## 2015-02-18 LAB — CREATININE, SERUM
Creatinine, Ser: 3.73 mg/dL — ABNORMAL HIGH (ref 0.61–1.24)
GFR calc Af Amer: 20 mL/min — ABNORMAL LOW (ref >60)
GFR calc non Af Amer: 17 mL/min — ABNORMAL LOW (ref >60)

## 2015-02-18 LAB — CBG MONITORING, ED: Glucose-Capillary: 236 mg/dL — ABNORMAL HIGH (ref 65–99)

## 2015-02-18 LAB — ETHANOL: Alcohol, Ethyl (B): <5 mg/dL (ref <5)

## 2015-02-18 LAB — I-STAT CG4 LACTIC ACID, ED: Lactic Acid, Venous: 15.39 mmol/L (ref 0.5–2.0)

## 2015-02-18 LAB — SALICYLATE LEVEL: Salicylate Lvl: <4 mg/dL (ref 2.8–30.0)

## 2015-02-18 LAB — AMMONIA: Ammonia: 20 umol/L (ref 9–35)

## 2015-02-18 LAB — MRSA PCR SCREENING: MRSA by PCR: NEGATIVE

## 2015-02-18 MED ORDER — AMMONIA AROMATIC IN INHA
RESPIRATORY_TRACT | Status: AC
  Administered 2015-02-18: 06:00:00
  Filled 2015-02-18: qty 10

## 2015-02-18 MED ORDER — SODIUM CHLORIDE 0.9 % IV BOLUS (SEPSIS)
1000.0000 mL | Freq: Once | INTRAVENOUS | Status: AC
  Administered 2015-02-18: 1000 mL via INTRAVENOUS

## 2015-02-18 MED ORDER — PANTOPRAZOLE SODIUM 40 MG IV SOLR
40.0000 mg | Freq: Every day | INTRAVENOUS | Status: DC
  Administered 2015-02-18 – 2015-02-23 (×6): 40 mg via INTRAVENOUS
  Filled 2015-02-18 (×7): qty 40

## 2015-02-18 MED ORDER — FENTANYL BOLUS VIA INFUSION
50.0000 ug | INTRAVENOUS | Status: DC | PRN
  Filled 2015-02-18: qty 50

## 2015-02-18 MED ORDER — SODIUM BICARBONATE 8.4 % IV SOLN
100.0000 meq | Freq: Once | INTRAVENOUS | Status: AC
  Administered 2015-02-18: 100 meq via INTRAVENOUS
  Filled 2015-02-18: qty 100

## 2015-02-18 MED ORDER — SODIUM CHLORIDE 0.9 % IV BOLUS (SEPSIS)
500.0000 mL | Freq: Once | INTRAVENOUS | Status: AC
  Administered 2015-02-18: 500 mL via INTRAVENOUS

## 2015-02-18 MED ORDER — SODIUM CHLORIDE 0.9 % IV SOLN
INTRAVENOUS | Status: DC
  Administered 2015-02-18: 3.8 [IU]/h via INTRAVENOUS
  Administered 2015-02-19: 6.4 [IU]/h via INTRAVENOUS
  Filled 2015-02-18 (×2): qty 2.5

## 2015-02-18 MED ORDER — METHYLENE BLUE 0.5 % INJ SOLN
200.0000 mg | Freq: Once | INTRAVENOUS | Status: AC
Start: 2015-02-18 — End: 2015-02-18
  Administered 2015-02-18: 200 mg via INTRAVENOUS
  Filled 2015-02-18: qty 40

## 2015-02-18 MED ORDER — MIDAZOLAM HCL 2 MG/2ML IJ SOLN
2.0000 mg | INTRAMUSCULAR | Status: DC | PRN
  Administered 2015-02-18: 1 mg via INTRAVENOUS
  Filled 2015-02-18 (×2): qty 2

## 2015-02-18 MED ORDER — HEPARIN SODIUM (PORCINE) 1000 UNIT/ML DIALYSIS
1000.0000 [IU] | INTRAMUSCULAR | Status: DC | PRN
  Filled 2015-02-18: qty 6

## 2015-02-18 MED ORDER — SODIUM CHLORIDE 0.9 % IV SOLN
10.0000 mg/kg | Freq: Three times a day (TID) | INTRAVENOUS | Status: DC
  Filled 2015-02-18 (×2): qty 1

## 2015-02-18 MED ORDER — FOMEPIZOLE 1 GM/ML IV SOLN
15.0000 mg/kg | Freq: Once | INTRAVENOUS | Status: AC
  Administered 2015-02-18: 1500 mg via INTRAVENOUS
  Filled 2015-02-18: qty 1.5

## 2015-02-18 MED ORDER — FENTANYL CITRATE (PF) 100 MCG/2ML IJ SOLN: 100.0000 ug | Freq: Once | INTRAMUSCULAR | Status: AC

## 2015-02-18 MED ORDER — SODIUM CHLORIDE 0.9 % FOR CRRT
INTRAVENOUS_CENTRAL | Status: DC | PRN
  Filled 2015-02-18: qty 1000

## 2015-02-18 MED ORDER — ASPIRIN 81 MG PO CHEW: 324.0000 mg | CHEWABLE_TABLET | ORAL | Status: AC

## 2015-02-18 MED ORDER — HEPARIN BOLUS VIA INFUSION (CRRT)
1000.0000 [IU] | INTRAVENOUS | Status: DC | PRN
  Filled 2015-02-18: qty 1000

## 2015-02-18 MED ORDER — STERILE WATER FOR INJECTION IV SOLN
INTRAVENOUS | Status: DC
  Administered 2015-02-18 – 2015-02-22 (×21): via INTRAVENOUS_CENTRAL
  Filled 2015-02-18 (×37): qty 150

## 2015-02-18 MED ORDER — ASPIRIN 300 MG RE SUPP: 300.0000 mg | RECTAL | Status: AC

## 2015-02-18 MED ORDER — LIDOCAINE-EPINEPHRINE (PF) 2 %-1:200000 IJ SOLN: 20.0000 mL | Freq: Once | INTRAMUSCULAR | Status: DC

## 2015-02-18 MED ORDER — HEPARIN SODIUM (PORCINE) 1000 UNIT/ML DIALYSIS: 1000.0000 [IU] | INTRAMUSCULAR | Status: DC | PRN

## 2015-02-18 MED ORDER — VASOPRESSIN 20 UNIT/ML IV SOLN
0.0300 [IU]/min | INTRAVENOUS | Status: DC
  Administered 2015-02-18 – 2015-02-20 (×3): 0.03 [IU]/min via INTRAVENOUS
  Filled 2015-02-18 (×4): qty 2

## 2015-02-18 MED ORDER — PRISMASOL BGK 4/2.5 32-4-2.5 MEQ/L IV SOLN
INTRAVENOUS | Status: DC
  Administered 2015-02-18 – 2015-02-21 (×10): via INTRAVENOUS_CENTRAL
  Filled 2015-02-18 (×12): qty 5000

## 2015-02-18 MED ORDER — VECURONIUM BROMIDE 10 MG IV SOLR
INTRAVENOUS | Status: AC
  Administered 2015-02-18: 10 mg
  Filled 2015-02-18: qty 10

## 2015-02-18 MED ORDER — FENTANYL CITRATE (PF) 100 MCG/2ML IJ SOLN
INTRAMUSCULAR | Status: AC
  Administered 2015-02-18: 100 ug
  Filled 2015-02-18: qty 4

## 2015-02-18 MED ORDER — SODIUM BICARBONATE 8.4 % IV SOLN
INTRAVENOUS | Status: AC
  Administered 2015-02-18 – 2015-02-19 (×2): via INTRAVENOUS
  Filled 2015-02-18 (×4): qty 150

## 2015-02-18 MED ORDER — STERILE WATER FOR INJECTION IV SOLN
INTRAVENOUS | Status: DC
  Administered 2015-02-18: 12:00:00 via INTRAVENOUS_CENTRAL
  Filled 2015-02-18 (×3): qty 150

## 2015-02-18 MED ORDER — SODIUM CHLORIDE 0.9 % IJ SOLN: 250.0000 [IU]/h | INTRAMUSCULAR | Status: DC

## 2015-02-18 MED ORDER — SODIUM CHLORIDE 0.9 % IV SOLN
25.0000 ug/h | INTRAVENOUS | Status: DC
  Administered 2015-02-18: 25 ug/h via INTRAVENOUS
  Administered 2015-02-18 – 2015-02-19 (×2): 200 ug/h via INTRAVENOUS
  Administered 2015-02-19: 250 ug/h via INTRAVENOUS
  Administered 2015-02-20: 200 ug/h via INTRAVENOUS
  Administered 2015-02-20: 250 ug/h via INTRAVENOUS
  Filled 2015-02-18 (×7): qty 50

## 2015-02-18 MED ORDER — HEPARIN (PORCINE) 2000 UNITS/L FOR CRRT
INTRAVENOUS_CENTRAL | Status: DC | PRN
  Administered 2015-02-21: 21:00:00 via INTRAVENOUS_CENTRAL
  Filled 2015-02-18 (×2): qty 1000

## 2015-02-18 MED ORDER — SODIUM BICARBONATE 8.4 % IV SOLN
INTRAVENOUS | Status: AC
  Filled 2015-02-18: qty 100

## 2015-02-18 MED ORDER — ROCURONIUM BROMIDE 50 MG/5ML IV SOLN
50.0000 mg | Freq: Once | INTRAVENOUS | Status: AC
  Administered 2015-02-18: 50 mg via INTRAVENOUS

## 2015-02-18 MED ORDER — SODIUM BICARBONATE 8.4 % IV SOLN
50.0000 meq | Freq: Once | INTRAVENOUS | Status: AC
  Administered 2015-02-18: 50 meq via INTRAVENOUS
  Filled 2015-02-18: qty 50

## 2015-02-18 MED ORDER — SODIUM CHLORIDE 0.9 % IJ SOLN
250.0000 [IU]/h | INTRAMUSCULAR | Status: DC
  Administered 2015-02-18: 250 [IU]/h via INTRAVENOUS_CENTRAL
  Administered 2015-02-18 (×2): 2500 [IU]/h via INTRAVENOUS_CENTRAL
  Administered 2015-02-19: 2300 [IU]/h via INTRAVENOUS_CENTRAL
  Administered 2015-02-19: 2350 [IU]/h via INTRAVENOUS_CENTRAL
  Administered 2015-02-19: 2450 [IU]/h via INTRAVENOUS_CENTRAL
  Administered 2015-02-19 (×2): 2500 [IU]/h via INTRAVENOUS_CENTRAL
  Administered 2015-02-20: 2100 [IU]/h via INTRAVENOUS_CENTRAL
  Administered 2015-02-20: 1950 [IU]/h via INTRAVENOUS_CENTRAL
  Filled 2015-02-18 (×12): qty 2

## 2015-02-18 MED ORDER — SODIUM BICARBONATE 8.4 % IV SOLN
INTRAVENOUS | Status: AC
  Administered 2015-02-18: 100 meq
  Filled 2015-02-18: qty 100

## 2015-02-18 MED ORDER — SODIUM BICARBONATE 8.4 % IV SOLN
INTRAVENOUS | Status: AC
  Administered 2015-02-18: 50 meq
  Filled 2015-02-18: qty 50

## 2015-02-18 MED ORDER — NOREPINEPHRINE BITARTRATE 1 MG/ML IV SOLN
2.0000 ug/min | INTRAVENOUS | Status: DC
  Administered 2015-02-18: 2 ug/min via INTRAVENOUS
  Filled 2015-02-18 (×2): qty 4

## 2015-02-18 MED ORDER — HEPARIN (PORCINE) IN NACL 100-0.45 UNIT/ML-% IJ SOLN
200.0000 [IU]/h | INTRAMUSCULAR | Status: DC
  Administered 2015-02-18: 200 [IU]/h via INTRAVENOUS
  Filled 2015-02-18: qty 250

## 2015-02-18 MED ORDER — PRISMASOL BGK 4/2.5 32-4-2.5 MEQ/L IV SOLN
INTRAVENOUS | Status: DC
  Administered 2015-02-18 – 2015-02-22 (×25): via INTRAVENOUS_CENTRAL
  Filled 2015-02-18 (×34): qty 5000

## 2015-02-18 MED ORDER — NOREPINEPHRINE BITARTRATE 1 MG/ML IV SOLN
0.0000 ug/min | INTRAVENOUS | Status: DC
  Administered 2015-02-18 (×2): 70 ug/min via INTRAVENOUS
  Administered 2015-02-18: 40 ug/min via INTRAVENOUS
  Administered 2015-02-18: 75 ug/min via INTRAVENOUS
  Administered 2015-02-19: 70 ug/min via INTRAVENOUS
  Administered 2015-02-19: 20 ug/min via INTRAVENOUS
  Administered 2015-02-20: 22 ug/min via INTRAVENOUS
  Administered 2015-02-20: 18 ug/min via INTRAVENOUS
  Filled 2015-02-18 (×9): qty 16

## 2015-02-18 MED ORDER — HYDROCORTISONE NA SUCCINATE PF 100 MG IJ SOLR
50.0000 mg | Freq: Four times a day (QID) | INTRAMUSCULAR | Status: DC
  Administered 2015-02-18 – 2015-02-23 (×19): 50 mg via INTRAVENOUS
  Filled 2015-02-18 (×2): qty 2
  Filled 2015-02-18 (×2): qty 1
  Filled 2015-02-18: qty 2
  Filled 2015-02-18: qty 1
  Filled 2015-02-18 (×2): qty 2
  Filled 2015-02-18 (×10): qty 1
  Filled 2015-02-18: qty 2
  Filled 2015-02-18 (×3): qty 1

## 2015-02-18 MED ORDER — SODIUM CHLORIDE 0.9 % IV BOLUS (SEPSIS)
2000.0000 mL | Freq: Once | INTRAVENOUS | Status: AC
  Administered 2015-02-18: 2000 mL via INTRAVENOUS

## 2015-02-18 MED ORDER — HEPARIN SODIUM (PORCINE) 5000 UNIT/ML IJ SOLN
5000.0000 [IU] | Freq: Three times a day (TID) | INTRAMUSCULAR | Status: DC
  Administered 2015-02-19 – 2015-02-20 (×3): 5000 [IU] via SUBCUTANEOUS
  Filled 2015-02-18 (×6): qty 1

## 2015-02-18 MED ORDER — SODIUM CHLORIDE 0.9 % IV SOLN: 250.0000 mL | INTRAVENOUS | Status: DC | PRN

## 2015-02-18 MED ORDER — PHENYLEPHRINE HCL 10 MG/ML IJ SOLN
0.0000 ug/min | INTRAVENOUS | Status: DC
  Administered 2015-02-18 – 2015-02-19 (×7): 400 ug/min via INTRAVENOUS
  Administered 2015-02-19: 390 ug/min via INTRAVENOUS
  Administered 2015-02-19 (×3): 400 ug/min via INTRAVENOUS
  Administered 2015-02-19: 350 ug/min via INTRAVENOUS
  Administered 2015-02-19 (×2): 380 ug/min via INTRAVENOUS
  Administered 2015-02-19: 400 ug/min via INTRAVENOUS
  Administered 2015-02-19: 310 ug/min via INTRAVENOUS
  Administered 2015-02-19: 350 ug/min via INTRAVENOUS
  Administered 2015-02-20: 135 ug/min via INTRAVENOUS
  Administered 2015-02-20: 350 ug/min via INTRAVENOUS
  Administered 2015-02-20: 275 ug/min via INTRAVENOUS
  Administered 2015-02-20: 285 ug/min via INTRAVENOUS
  Administered 2015-02-20: 300 ug/min via INTRAVENOUS
  Administered 2015-02-20: 350 ug/min via INTRAVENOUS
  Administered 2015-02-20: 275 ug/min via INTRAVENOUS
  Administered 2015-02-20: 215 ug/min via INTRAVENOUS
  Filled 2015-02-18 (×30): qty 4

## 2015-02-18 MED ORDER — NALOXONE HCL 0.4 MG/ML IJ SOLN
0.4000 mg | Freq: Once | INTRAMUSCULAR | Status: AC
  Administered 2015-02-18: 0.4 mg via INTRAVENOUS

## 2015-02-18 MED ORDER — MIDAZOLAM HCL 2 MG/2ML IJ SOLN
INTRAMUSCULAR | Status: AC
  Administered 2015-02-18: 2 mg
  Filled 2015-02-18: qty 4

## 2015-02-18 MED ORDER — CHLORHEXIDINE GLUCONATE 0.12% ORAL RINSE (MEDLINE KIT)
15.0000 mL | Freq: Two times a day (BID) | OROMUCOSAL | Status: DC
  Administered 2015-02-18 – 2015-03-08 (×21): 15 mL via OROMUCOSAL
  Filled 2015-02-18 (×3): qty 15

## 2015-02-18 MED ORDER — ANTISEPTIC ORAL RINSE SOLUTION (CORINZ)
7.0000 mL | Freq: Four times a day (QID) | OROMUCOSAL | Status: DC
  Administered 2015-02-18 – 2015-03-05 (×30): 7 mL via OROMUCOSAL

## 2015-02-18 MED ORDER — MIDAZOLAM HCL 2 MG/2ML IJ SOLN: 2.0000 mg | INTRAMUSCULAR | Status: DC | PRN

## 2015-02-18 MED ORDER — HEPARIN (PORCINE) IN NACL 100-0.45 UNIT/ML-% IJ SOLN
INTRAMUSCULAR | Status: AC
  Filled 2015-02-18: qty 250

## 2015-02-18 MED ORDER — MIDAZOLAM HCL 2 MG/2ML IJ SOLN: 2.0000 mg | Freq: Once | INTRAMUSCULAR | Status: AC

## 2015-02-18 MED ORDER — DOCUSATE SODIUM 50 MG/5ML PO LIQD: 100.0000 mg | Freq: Two times a day (BID) | ORAL | Status: DC | PRN

## 2015-02-18 MED ORDER — NALOXONE HCL 2 MG/2ML IJ SOSY
PREFILLED_SYRINGE | INTRAMUSCULAR | Status: AC
  Administered 2015-02-18: 2 mg
  Filled 2015-02-18: qty 2

## 2015-02-18 MED ORDER — ETOMIDATE 2 MG/ML IV SOLN
20.0000 mg | Freq: Once | INTRAVENOUS | Status: AC
  Administered 2015-02-18: 20 mg via INTRAVENOUS

## 2015-02-18 MED ORDER — SODIUM BICARBONATE 8.4 % IV SOLN
INTRAVENOUS | Status: AC
  Filled 2015-02-18: qty 50

## 2015-02-18 MED ORDER — LIDOCAINE-EPINEPHRINE (PF) 2 %-1:200000 IJ SOLN
20.0000 mL | Freq: Once | INTRAMUSCULAR | Status: AC
  Administered 2015-02-18: 20 mL

## 2015-02-18 MED ORDER — FENTANYL CITRATE (PF) 100 MCG/2ML IJ SOLN: 50.0000 ug | Freq: Once | INTRAMUSCULAR | Status: AC

## 2015-02-18 MED ORDER — ALBUTEROL SULFATE (2.5 MG/3ML) 0.083% IN NEBU: 2.5000 mg | INHALATION_SOLUTION | RESPIRATORY_TRACT | Status: DC | PRN

## 2015-02-18 NOTE — Progress Notes (Signed)
eLink Physician-Brief Progress Note Patient Name: [name] [name] DOB: 1960/04/28 MRN: [id number]   Date of Service  02/18/2015  HPI/Events of Note  Hypotension - BP = 71/43. Now at maximum doses of Norepinephrine, Phenylephrine and Vasopressin IV infusion. Last pH= 6.954. Repeat ABG pending for 6 PM.   eICU Interventions  Will order: 1. Increase ceiling on Norepinephrine IV infusion to 70 mcg/min. 2. NaHCO3 100 meq IV now.      Intervention Category Major Interventions: Acid-Base disturbance - evaluation and management;Hypotension - evaluation and management  Parris Cudworth Cornelia Copa 02/18/2015, 5:59 PM

## 2015-02-18 NOTE — ED Notes (Signed)
Paged nephrology to critical care @ [phone]

## 2015-02-18 NOTE — Progress Notes (Signed)
ABG results given to Tammy, NP @1505 

## 2015-02-18 NOTE — ED Notes (Signed)
Contacted Pattie at [facility] d/t OD. Recommending lactate level d/t potential for metformin ingestion d/t patient being RXed that medication. Gabapentin may cause CNS depression, seroquel may cause CNS & respiratory depression, hypotension, tachycardia, QRS/QT delays. Recommend obs for 6 hours, EKG upon initial assessment and at 6 hour mark. . If patient displays hypotension of respiratory depression, admit as medications will be in his system for some time. If patient becomes hypotensive, give fluids and norepi - no dopamine.

## 2015-02-18 NOTE — Progress Notes (Signed)
CRITICAL VALUE ALERT  Critical value received:  Lactic acid 14.5  Date of notification:  02/18/2015  Time of notification:  2345  Critical value read back:Yes.    Nurse who received alert:  Fayrene Helper  MD notified (1st page):  Dr. Ashok Cordia  Time of first page:  2350  MD notified (2nd page):  Time of second page:  Responding MD:  Dr. Ashok Cordia  Time MD responded:  2350

## 2015-02-18 NOTE — Procedures (Signed)
Central Venous Catheter Insertion Procedure Note [name] [name] [id number] 1960/03/15  Procedure: Insertion of Central Venous Catheter Indications: refractory shock   Procedure Details Consent: Risks of procedure as well as the alternatives and risks of each were explained to the (patient/caregiver).  Consent for procedure obtained. Time Out: Verified patient identification, verified procedure, site/side was marked, verified correct patient position, special equipment/implants available, medications/allergies/relevent history reviewed, required imaging and test results available.  Performed  Maximum sterile technique was used including antiseptics, cap, gloves, gown, hand hygiene, mask and sheet. Skin prep: Chlorhexidine; local anesthetic administered A antimicrobial bonded/coated triple lumen catheter was placed in the right internal jugular vein using the Seldinger technique.  Evaluation Blood flow good Complications: Complications of UNABLE TO GET SKIN DILATED and GUIDE WIRE KINKED. LEFT subclavian attempted wit new kit but guidewitte would not thread. Brother indicarted it runs in family Patient did tolerate procedure well. Chest X-ray ordered to verify placement.  CXR: pending.  [name] [name] 02/18/2015, 9:38 PM  procedure abandoned   Dr. Brand Males, M.D., [facility].C.P Pulmonary and Critical Care Medicine Staff Physician [place] [facility] and Critical [facility] Pager: [phone], If no answer or between  15:00h - 7:00h: call [phone]  02/18/2015 9:40 PM

## 2015-02-18 NOTE — Progress Notes (Signed)
Called with patient becoming profoundly hypotensive.  All labs reviewed and data gathered and reviewed.  The patient is profoundly acidotic and is not responding to pressors.  Lactic acid rising.  Called renal for consultation, will place HD catheter and a-line femorally.  Begin CRRT per renal.  Start bicarb drip.  Add vaso and neo to maintain BP while addressing acidosis.  Vent settings entered, patient will be intubated as well with bicarb being pushed prior to sedation.  The patient is critically ill with multiple organ systems failure and requires high complexity decision making for assessment and support, frequent evaluation and titration of therapies, application of advanced monitoring technologies and extensive interpretation of multiple databases.   Critical Care Time devoted to patient care services described in this note is  45  Minutes. This time reflects time of care of this signee Dr [name]. This critical care time does not reflect procedure time, or teaching time or supervisory time of PA/NP/Med student/Med Resident etc but could involve care discussion time.  Rush Farmer, M.D. [facility] Pulmonary/Critical Care Medicine. Pager: [phone]. After hours pager: [phone].

## 2015-02-18 NOTE — ED Notes (Signed)
Dr. Christy Gentles and Dr. Ancil Linsey at the bedside.

## 2015-02-18 NOTE — Progress Notes (Signed)
CRITICAL VALUE ALERT  Critical value received:  Lactic Acid 14.7  Date of notification:  02/18/15  Time of notification:  1904  Critical value read back:Yes.    Nurse who received alert:  Candie Mile, RN  MD notified (1st page):  [phone]  Time of first page:  Dr. Emmit Alexanders  MD notified (2nd page):  Time of second page:  Responding MD:  Dr. Emmit Alexanders  Time MD responded:  1910

## 2015-02-18 NOTE — Progress Notes (Signed)
ABG results given to [employer], RN @ [phone].

## 2015-02-18 NOTE — Procedures (Signed)
Intubation Procedure Note ZY HEDGLIN [id number] Mar 20, 1960  Procedure: Intubation Indications: Airway protection and maintenance  Procedure Details Consent: Unable to obtain consent because of emergent medical necessity. Time Out: Verified patient identification, verified procedure, site/side was marked, verified correct patient position, special equipment/implants available, medications/allergies/relevent history reviewed, required imaging and test results available.  Performed  Maximum sterile technique was used including gloves, hand hygiene and mask.  MAC    Evaluation Hemodynamic Status: BP stable throughout; O2 sats: stable throughout Patient's Current Condition: stable Complications: No apparent complications Patient did tolerate procedure well. Chest X-ray ordered to verify placement.  CXR: tube position acceptable.   [name] 02/18/2015

## 2015-02-18 NOTE — Progress Notes (Signed)
CRITICAL VALUE ALERT  Critical value received:  Lactic Acid 5.2  Date of notification:  02/18/15  Time of notification:  1632   Critical value read back:Yes.    Nurse who received alert:  Candie Mile, RN  MD notified (1st page):  Dr. Emmit Alexanders  Time of first page:  1653   Responding MD:  Dr. Emmit Alexanders   Time MD responded:  [phone]

## 2015-02-18 NOTE — Progress Notes (Signed)
eLink Physician-Brief Progress Note Patient Name: [name] [name] DOB: [birth year] MRN: [id number]   Date of Service  02/18/2015  HPI/Events of Note  PH still 7.1 despite Bicarb drip & CVVHD. Ventilator maximized w/ pCO2.  eICU Interventions  Increase Bicarb gtt to 150cc/hr.     Intervention Category Major Interventions: Electrolyte abnormality - evaluation and management  Tera Partridge 02/18/2015, 11:55 PM

## 2015-02-18 NOTE — Procedures (Signed)
Central Venous Catheter Insertion Procedure Note [name] [name] [id number] 25-Dec-1960  Procedure: Insertion of Central Venous Trialysis Catheter Indications: Dialysis  Procedure Details Consent: Unable to obtain consent because of emergent medical necessity. Time Out: Verified patient identification, verified procedure, site/side was marked, verified correct patient position, special equipment/implants available, medications/allergies/relevent history reviewed, required imaging and test results available.  Performed  Maximum sterile technique was used including antiseptics, cap, gloves, gown, hand hygiene, mask and sheet. Skin prep: Chlorhexidine; local anesthetic administered A antimicrobial bonded/coated triple lumen catheter was placed in the left femoral vein due to patient being a dialysis patient using the Seldinger technique.  Evaluation Blood flow good Complications: No apparent complications Patient did tolerate procedure well. Chest X-ray ordered to verify placement.  CXR: None needed.  U/S used in placement.  [name] [name] 02/18/2015, 11:55 AM

## 2015-02-18 NOTE — Progress Notes (Signed)
eLink Physician-Brief Progress Note Patient Name: [name] [name] DOB: 1960/09/15 MRN: [id number]   Date of Service  02/18/2015  HPI/Events of Note  Lactic Acid = 14.3 >> 5.2. Last ABG at  3 PM with pH = 6.95. Now on CRRT with NaHCO3 replacement fluid for 1 hour.   eICU Interventions  Will order ABG at 6 PM.      Intervention Category Major Interventions: Acid-Base disturbance - evaluation and management  Sommer,Steven Eugene 02/18/2015, 4:57 PM

## 2015-02-18 NOTE — ED Provider Notes (Signed)
CSN: [id number]     Arrival date & time 02/18/15  0435 History   First MD Initiated Contact with Patient 02/18/15 [phone]     Chief Complaint  Patient presents with  . Drug Overdose  . Suicide Attempt   LEVEL 5 CAVEAT DUE TO ACUITY OF CONDITION  Patient is a 55 y.o. male presenting with Overdose. The history is provided by the patient. The history is limited by the condition of the patient.  Drug Overdose This is a new problem. Episode onset: UNKNOWN. The problem occurs constantly. The problem has been rapidly worsening. Nothing aggravates the symptoms. Nothing relieves the symptoms.  pt presents from home via EMS He presents with overdose and laceration of left wrist Pt took large quantities of lisinopril (entire bottle empty, filled on 01/30) seroquel (bottle empty and filled on 2/2) and also gabapentin He also cut his left wrist Pt was attempting to harm himself Family called ambulance Per EMS,pt has been somnolent Pt has h/o bipolar No other details are known on arrival  Past Medical History  Diagnosis Date  . Depressed bipolar disorder ([place])   . Hypertension   . Diabetes mellitus without complication ([place])   . Sleep apnea   . Diabetic neuropathy [facility])    Past Surgical History  Procedure Laterality Date  . Spine surgery  2010  . Shoulder arthroscopy with rotator cuff repair Right 2009   Family History  Problem Relation Age of Onset  . Diabetes Mother   . Hyperlipidemia Mother   . Heart disease Father    Social History  Substance Use Topics  . Smoking status: Never Smoker   . Smokeless tobacco: None  . Alcohol Use: No    Review of Systems  Unable to perform ROS: Acuity of condition      Allergies  Oxytetracycline and Sulfonamide derivatives  Home Medications   Prior to Admission medications   Medication Sig Start Date End Date Taking? Authorizing Provider  buPROPion (WELLBUTRIN SR) 150 MG 12 hr tablet Take 150 mg by mouth 2 (two) times daily.    Historical  Provider, MD  gabapentin ([name]) 600 MG tablet TAKE 1 TABLET MOUTH 3 TIMES DAY Patient taking differently: Take 600 mg by mouth as needed for diabetic neuropathy. 10/03/14   Lance Bosch, NP  lisinopril-hydrochlorothiazide (PRINZIDE,ZESTORETIC) 20-12.5 MG tablet Take 1 tablet by mouth daily. 10/17/14   Lance Bosch, NP  metFORMIN (GLUCOPHAGE) 500 MG tablet Take 1 tablet (500 mg total) by mouth 2 (two) times daily with a meal. 10/17/14   Lance Bosch, NP  methadone (DOLOPHINE) 10 MG/ML solution Take 70 mg by mouth daily.     Historical Provider, MD  neomycin-bacitracin-polymyxin (NEOSPORIN) ointment Apply 1 application topically every 12 (twelve) hours. apply to eye    Historical Provider, MD  omeprazole (PRILOSEC) 20 MG capsule Take 1 capsule (20 mg total) by mouth 2 (two) times daily before a meal. Patient taking differently: Take 20 mg by mouth daily as needed (indigestion/acid reflux).  01/01/14   Barton Dubois, MD  QUEtiapine (SEROQUEL XR) 300 MG 24 hr tablet Take 300 mg by mouth at bedtime.    Historical Provider, MD   BP 115/93 mmHg  Pulse 87  Temp(Src) 97.4 F (36.3 C) (Oral)  Resp 17  Wt 99.791 kg  SpO2 100% Physical Exam CONSTITUTIONAL: somnolent HEAD: Normocephalic/atraumatic EYES: EOMI/PERRL ENMT: Mucous membranes dry NECK: supple no meningeal signs SPINE/BACK:entire spine nontender CV: S1/S2 noted, no murmurs/rubs/gallops noted LUNGS: decreased BS noted bilaterally ABDOMEN:  soft, nontender NEURO: Pt is somnolent but will arouse to deep pain.  He moves all extremities.   EXTREMITIES: pulses normal/equal, full ROM, 3cm laceration to left volar wrist, bleeding controlled SKIN: warm, color normal PSYCH: unable to assess  ED Course  Procedures   CRITICAL CARE Performed by: Sharyon Cable Total critical care time: 50 minutes Critical care time was exclusive of separately billable procedures and treating other patients. Critical care was necessary to treat or  prevent imminent or life-threatening deterioration. Critical care was time spent personally by me on the following activities: development of treatment plan with patient and/or surrogate as well as nursing, discussions with consultants, evaluation of patient's response to treatment, examination of patient, obtaining history from patient or surrogate, ordering and performing treatments and interventions, ordering and review of laboratory studies, ordering and review of radiographic studies, pulse oximetry and re-evaluation of patient's condition. PATIENT WITH SIGNIFICANT OVERDOSE AND METABOLIC ACIDOSIS REQUIRING ICU ADMISSION  LACERATION REPAIR Performed by: Sharyon Cable Consent: Verbal consent obtained. Risks and benefits: risks, benefits and alternatives were discussed Patient identity confirmed: provided demographic data Time out performed prior to procedure Prepped and Draped in normal sterile fashion Wound explored Laceration Location: left wrist Laceration Length: 3cm No Foreign Bodies seen or palpated Anesthesia: local infiltration Local anesthetic: lidocaine % with epinephrine Anesthetic total: 5 ml Irrigation method: syringe Amount of cleaning: standard Skin closure: staples Number of sutures or staples: 5 staples Technique: staple Patient tolerance: Patient tolerated the procedure well with no immediate complications.  Labs Review Labs Reviewed  COMPREHENSIVE METABOLIC PANEL - Abnormal; Notable for the following:    Chloride 97 (*)    CO2 10 (*)    Glucose, Bld 278 (*)    BUN 29 (*)    Creatinine, Ser 3.48 (*)    GFR calc non Af Amer 19 (*)    GFR calc Af Amer 22 (*)    Anion gap 31 (*)    All other components within normal limits  ACETAMINOPHEN LEVEL - Abnormal; Notable for the following:    Acetaminophen (Tylenol), Serum <10 (*)    All other components within normal limits  URINE RAPID DRUG SCREEN, HOSP PERFORMED - Abnormal; Notable for the following:     Barbiturates POSITIVE (*)    All other components within normal limits  CBG MONITORING, ED - Abnormal; Notable for the following:    Glucose-Capillary 236 (*)    All other components within normal limits  I-STAT CG4 LACTIC ACID, ED - Abnormal; Notable for the following:    Lactic Acid, Venous 15.39 (*)    All other components within normal limits  I-STAT ARTERIAL BLOOD GAS, ED - Abnormal; Notable for the following:    pH, Arterial 7.113 (*)    pCO2 arterial 20.7 (*)    pO2, Arterial 134.0 (*)    Bicarbonate 6.7 (*)    Acid-base deficit 21.0 (*)    All other components within normal limits  CBC WITH DIFFERENTIAL/PLATELET  AMMONIA  ETHANOL  SALICYLATE LEVEL  CBC  CREATININE, SERUM  LACTIC ACID, PLASMA  LACTIC ACID, PLASMA  LACTIC ACID, PLASMA  LACTIC ACID, PLASMA  LACTIC ACID, PLASMA    I have personally reviewed and evaluated these lab results as part of my medical decision-making.   EKG Interpretation   Date/Time:  Sunday February 18 2015 04:51:28 EST Ventricular Rate:  89 PR Interval:  52 QRS Duration: 99 QT Interval:  413 QTC Calculation: 503 R Axis:   19 Text Interpretation:  Sinus  rhythm Short PR interval Prolonged QT interval  artifact noted Confirmed by Christy Gentles  MD, Elisea Khader ([id number]) on 02/18/2015  4:58:12 AM     5:35 AM Pt with significant overdose He is now becoming hypotensive He has anion gap acidosis IV fluids infusing I have spoken to critical care and they will evaluate patient For wrist laceration - no active bleeding, closure with staples.  Once he is awake/alert, will need re-evaluation for any tendon injury.   6:07 AM Critical care fellow at bedside We discussed case Pt has significant metabolic acidosis We are now concerned he also took metformin (he is on this daily) D/w poison center Given profound lactic acidosis, recommend emergent dialysis Critical care will arrange dialysis His BP is very labile during his ED stay He is still somnolent  but arousable.  Will defer intubation for now 6:55 AM D/w dr Ancil Linsey He is arranging dialysis We are starting levophed for hypotension Plan to admit to icu He will arrange dialysis and will defer intubation until dialysis starts Pt is mildly agitated at this time  Medications  norepinephrine (LEVOPHED) 4 mg in dextrose 5 % 250 mL (0.016 mg/mL) infusion (5 mcg/min Intravenous Rate/Dose Change 02/18/15 0643)  aspirin chewable tablet 324 mg (not administered)    Or  aspirin suppository 300 mg (not administered)  0.9 %  sodium chloride infusion (not administered)  heparin injection 5,000 Units (not administered)  naloxone (NARCAN) injection 0.4 mg (0.4 mg Intravenous Given 02/18/15 0440)  sodium chloride 0.9 % bolus 1,000 mL (0 mLs Intravenous Stopped 02/18/15 0548)  ammonia inhalant (  Given 02/18/15 0550)  naloxone (NARCAN) 2 MG/2ML injection (2 mg  Given 02/18/15 0440)  sodium chloride 0.9 % bolus 1,000 mL (0 mLs Intravenous Stopped 02/18/15 0638)  lidocaine-EPINEPHrine (XYLOCAINE W/EPI) 2 %-1:200000 (PF) injection 20 mL (20 mLs Infiltration Given 02/18/15 0528)  sodium chloride 0.9 % bolus 1,000 mL (1,000 mLs Intravenous New Bag/Given 02/18/15 0551)     MDM   Final diagnoses:  Overdose, intentional self-harm, initial encounter ([place])  AKI (acute kidney injury) ([place])  Metabolic acidosis    Nursing notes including past medical history and social history reviewed and considered in documentation Labs/vital reviewed myself and considered during evaluation     Ripley Fraise, MD 02/18/15 [phone]

## 2015-02-18 NOTE — ED Notes (Signed)
CBG 236. 

## 2015-02-18 NOTE — Progress Notes (Signed)
eLink Physician-Brief Progress Note Patient Name: [name] [name] DOB: 1960/04/26 MRN: [id number]   Date of Service  02/18/2015  HPI/Events of Note  Notified by lab that earlier Lactic Acid reported as 5.2 was actually 15.6 when the lab was rerun.   eICU Interventions  Nothing further to do at this time.      Intervention Category Major Interventions: Acid-Base disturbance - evaluation and management  Sommer,Steven Eugene 02/18/2015, 7:26 PM

## 2015-02-18 NOTE — ED Notes (Signed)
Staffing called for a sitter at this time. No one available at this time.

## 2015-02-18 NOTE — Progress Notes (Signed)
eLink Physician-Brief Progress Note Patient Name: ADDAN KARAS DOB: [birth year] MRN: [id number]   Date of Service  02/18/2015  HPI/Events of Note  Lactic Acid = 5.2 >> 14.7. Etiology? DDx: Hypotension vs Metformin vs toxic alcohol ingestion. Remains on Norepinephrine, Phenylephrine and Vasopressin IV infusions for hemodynamic support. Patient on maximal support for hemodynamics and on CVVHD. Last pH = 7.026. Studies for Ethylene Glycol and Volatile alcohols sent. BP = 79/42 (MAP = 52) by A-line.   eICU Interventions  Will order: 1. NaHCO3 100 meq IV now.      Intervention Category Major Interventions: Acid-Base disturbance - evaluation and management  Sommer,Steven Eugene 02/18/2015, 7:14 PM

## 2015-02-18 NOTE — Progress Notes (Signed)
CRITICAL VALUE ALERT  Critical value received:  Lactic Acid 14.5  Date of notification:  01/18/15  Time of notification:  0906  Critical value read back:Yes.    Nurse who received alert:  Candie Mile, RN  MD notified (1st page):  Rexene Edison, NP present at time of page and notified of results

## 2015-02-18 NOTE — Progress Notes (Addendum)
   Failed CVL attempt  Patient in refractory shock. 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 Wrote suicide note 02/17/15 clearly indicated desire to die and allowed to die due to bipolar disorder but family think this is probably a reaction to drug withdrawal after coming out of rehab and feel note should not be seen as a living will kind of document    - suspects he likely OD on metformin and "rubbing alcohol"  - the broght several things from home in his room and these are   -RUSTY DUCK - gun lubricant SPRAY - residual content +  - COmputer dust remover SPRAY -residual content +  -LYSOL SPRAY  -r sidual content +  - Dry skin lotion - bottle is full of contnetn x 2 bottles  - clobetasol gel - content full +  = MENTHOL 4% liquid - bottle 75% empty  - aspirin tablet box - unclear ifhe took it or note - small paper box - has no tablets   Discussed code status  - full code. They says they are aware he will die but wants [place] try everything "humanely possible" even though CPR is ineffective   Per poison control - spoke to RN in my presence  - even though on CRRT still give fomepizaole  - start methylene blue  Final dx Patient Active Problem List   Diagnosis Date Noted  . Overdose 02/18/2015    Priority: High  . Acute renal failure (ARF) ([place]) 02/18/2015    Priority: High  . Lactic acidosis 02/18/2015    Priority: High  . Shock circulatory ([place]) 02/18/2015    Priority: High  . Acute respiratory failure with hypoxia ([facility])     Priority: High  . Arterial hypotension     Priority: High  . AKI (acute kidney injury) ([place])     Priority: High  . Depressed bipolar disorder ([place]) 01/01/2014    Priority: High  . Type 2 diabetes mellitus with diabetic  polyneuropathy ([place]) 06/09/2014  . Chest pain 12/31/2013  . HERPES ZOSTER 12/05/2008  . SINUSITIS- ACUTE-NOS 12/05/2008  . Hyperlipidemia 05/11/2008  . DEPRESSION 05/11/2008  . Essential hypertension 05/11/2008  . GERD 05/11/2008  . OSTEOARTHRITIS 05/11/2008  . LOW BACK PAIN 05/11/2008  . COLONIC POLYPS, HX OF 05/11/2008       The patient is critically ill with multiple organ systems failure and requires high complexity decision making for assessment and support, frequent evaluation and titration of therapies, application of advanced monitoring technologies and extensive interpretation of multiple databases.   Critical Care Time devoted to patient care services described in this note is  60  Minutes. This time reflects time of care of this signee Dr Brand Males. This critical care time does not reflect procedure time, or teaching time or supervisory time of PA/NP/Med student/Med Resident etc but could involve care discussion time    Dr. Brand Males, M.D., [facility].C.P Pulmonary and Critical Care Medicine Staff Physician [place] [facility] Pager: [phone], If no answer or between  15:00h - 7:00h: call [phone]  02/18/2015 9:50 PM

## 2015-02-18 NOTE — Progress Notes (Signed)
MEDICATION RELATED CONSULT NOTE - INITIAL   Pharmacy Consult for fomepizole Indication: possible ethylene glycol toxicity  Allergies  Allergen Reactions  . Oxytetracycline Rash  . Sulfonamide Derivatives Rash    Patient Measurements: Weight: 220 lb (99.791 kg) Labs:  Recent Labs  02/18/15 0440 02/18/15 0802 02/18/15 1534  WBC 9.5 9.1  --   HGB 14.2 12.6*  --   HCT 41.6 38.2*  --   PLT 182 159  --   CREATININE 3.48* 3.73* 4.83*  PHOS  --   --  14.5*  ALBUMIN 3.9  --  2.8*  PROT 6.7  --   --   AST 31  --   --   ALT 32  --   --   ALKPHOS 46  --   --   BILITOT 0.9  --   --    Estimated Creatinine Clearance: 22.3 mL/min (by C-G formula based on Cr of 4.83).  Assessment: 55 year old Caucasian man with past medical history significant for bipolar disorder (with prior suicidal attempts), hypertension, type 2 diabetes mellitus without any reported retinopathy as well as obstructive sleep apnea. Brought to the emergency room after found at home by his mother with a slit left wrist and empty medication bottles (lisinopril/HCTZ, gabapentin and quetiapine) and partially empty metformin bottle.   CCM concerned for ethylene glycol toxicity, poison control has been contacted and they have left [place] recommendations for dosing fomepizole. Situation complicated by patient being started on CRRT this am leaving dosing unclear.   There is a case report indicating that therapeutic concentrations of fomepizole were maintained for 16 hours after the initial dose in a patient on CRRT at an OSH. [email or web address]  End point of therapy: Continue fomepizole until ethylene glycol or methanol concentrations are undetectable or have been reduced below 20mg /dl and patient has a normal pH  Plan:  Load 15mg /kg of fomepizole x1 now Continue with standard 10mg /kg q8 hour dosing for now, low hesitation to increase interval to q12 hour based on follow up  labs  Erin Hearing PharmD., BCPS Clinical Pharmacist Pager [phone] 02/18/2015 7:08 PM

## 2015-02-18 NOTE — Progress Notes (Signed)
   02/18/15 0600  Clinical Encounter Type  Visited With Family  Visit Type ED  Referral From Nurse  Spiritual Encounters  Spiritual Needs Emotional  Stress Factors  Family Stress Factors Family relationships;Major life changes  Paged at 4:43 to respond to family in ED waiting room, whose patient was in for an overdose/suicide attempt. Escorted family to consult room for updates and discussion with doctor. Parents by phone insist that no extreme measures should be taken, but son's instructions to them to that effect were given, in doctor's opinion, after he had taken some of the drugs--in other words, while he was in a suicidal frame of mind. Doctor disinclined to act on those statements for now. Mother says there is a living will, which she will look for. After doctor left, helped patient's brother understand why doctor might want to do immediate extreme measures to see how patient responds and that they would not have to be continued if any of the conditions in a living will became patient's reality. Brother is struggling with discouragement over patient's years of depression and fear that he will just do this again if pulled out of this attempt. Chap sent brother and brother's daughter to caf for breakfast and to detach. Got cell phone number in case they were needed. Informed doctor.

## 2015-02-18 NOTE — Progress Notes (Signed)
ABG results given to Tammy, NP @ 0920am.

## 2015-02-18 NOTE — ED Notes (Signed)
Per [facility], pt from home for overdose on seroquel filled on 2/2, lisinopril filled on 1/30 and gabapentin filled on 1/30. Took all but one of the gabapentins. Has a 2 inch lac to left wrist, bleeding controlled. BP 110/70 and HR 90's. Pt is lethargic.

## 2015-02-18 NOTE — Consult Note (Signed)
Reason for Consult: Acute renal failure, profound metabolic acidosis from metformin overdose Referring Physician: [name] (CCM)  HPI:  55 year old Caucasian man with past medical history significant for bipolar disorder (with prior suicidal attempts), hypertension, type 2 diabetes mellitus without any reported retinopathy as well as obstructive sleep apnea. Brought to the emergency room after found at home by his mother with a slit left wrist and empty medication bottles (lisinopril/HCTZ, gabapentin and quetiapine) and partially empty metformin bottle. When seen by EMS in the field, his mentation was found to be altered but able to speak. In the emergency room, found to be in profound metabolic acidosis-bicarbonate 10, lactic acid level 15.4 and pH on ABG 7.1 with PCO2 21. Intubated for airway protection with his fluctuating mental status.  Past Medical History  Diagnosis Date  . Depressed bipolar disorder ([place])   . Hypertension   . Diabetes mellitus without complication ([place])   . Sleep apnea   . Diabetic neuropathy [facility])     Past Surgical History  Procedure Laterality Date  . Spine surgery  2010  . Shoulder arthroscopy with rotator cuff repair Right 2009    Family History  Problem Relation Age of Onset  . Diabetes Mother   . Hyperlipidemia Mother   . Heart disease Father     Social History:  reports that he has never smoked. He does not have any smokeless tobacco history on file. He reports that he does not drink alcohol or use illicit drugs.  Allergies:  Allergies  Allergen Reactions  . Oxytetracycline Rash  . Sulfonamide Derivatives Rash    Medications:  Scheduled: . antiseptic oral rinse  7 mL Mouth Rinse QID  . aspirin  324 mg Oral NOW   Or  . aspirin  300 mg Rectal NOW  . chlorhexidine gluconate  15 mL Mouth Rinse BID  . heparin  5,000 Units Subcutaneous 3 times per day  . pantoprazole (PROTONIX) IV  40 mg Intravenous Daily    BMP Latest Ref Rng 02/18/2015  02/18/2015 11/13/2014  Glucose 65 - 99 mg/dL - 278(H) 146(H)  BUN 6 - 20 mg/dL - 29(H) 19  Creatinine 0.61 - 1.24 mg/dL 3.73(H) 3.48(H) 0.94  Sodium 135 - 145 mmol/L - 138 136  Potassium 3.5 - 5.1 mmol/L - 4.8 4.7  Chloride 101 - 111 mmol/L - 97(L) 97(L)  CO2 22 - 32 mmol/L - 10(L) 27  Calcium 8.9 - 10.3 mg/dL - 9.4 9.6    CBC Latest Ref Rng 02/18/2015 02/18/2015 11/13/2014  WBC 4.0 - 10.5 K/uL 9.1 9.5 7.4  Hemoglobin 13.0 - 17.0 g/dL 12.6(L) 14.2 13.0  Hematocrit 39.0 - 52.0 % 38.2(L) 41.6 37.3(L)  Platelets 150 - 400 K/uL 159 182 198      No results found.  Review of Systems  Unable to perform ROS: intubated   Blood pressure 75/41, pulse 90, temperature 97.4 F (36.3 C), temperature source Oral, resp. rate 30, weight 99.791 kg (220 lb), SpO2 100 %. Physical Exam  Nursing note and vitals reviewed. Constitutional: He appears well-developed and well-nourished.  HENT:  Head: Normocephalic.  Nose: Nose normal.  Intubated and sedated  Eyes: Pupils are equal, round, and reactive to light. No scleral icterus.  Neck: No JVD present.  Cardiovascular: Normal rate, regular rhythm and normal heart sounds.   Respiratory: Effort normal and breath sounds normal. He has no wheezes. He has no rales.  GI: Soft. Bowel sounds are normal. There is no tenderness. There is no rebound.  Musculoskeletal: Normal  range of motion. He exhibits no edema.  Left wrist dressing clean and intact  Lymphadenopathy:    He has no cervical adenopathy.  Skin: Skin is warm and dry. No rash noted. No erythema.    Assessment/Plan: 1. Acute renal failure: Possibly hemodynamically mediated with lisinopril/hydrochlorothiazide and metformin overdose. Current hypotension also raising possibility for ischemic ATN. Will undertake CRRT for management of metabolic acidosis/metformin overdose with no ultrafiltration to minimize hemodynamic impact-clinically without evidence of volume overload. Will obtain urinalysis/urine  electrolytes and order renal ultrasound. 2. Metformin overdose/toxicity with lactic acidosis: With severe anion gap metabolic acidosis, elevated lactic acid levels and depressed mentation-minimal response to isotonic sodium bicarbonate drip with depressed renal function. This indicates need to start extracorporeal therapy/CRRT to correct metabolic abnormalities and eliminate lactic acid. 3. Suicidal attempt: Status post repair of left slit wrist and will undertake hemodialysis for management of profound metabolic acidosis from metformin toxicity. 4. Hypotension: Possibly from overdose with antihypertensive medications and profound metabolic acidosis.  Neylan Koroma K. 02/18/2015, 8:56 AM

## 2015-02-18 NOTE — H&P (Signed)
PULMONARY / CRITICAL CARE MEDICINE HISTORY AND PHYSICAL EXAMINATION   Name: [name] [name] MRN: [id number] DOB: 09/28/1960    ADMISSION DATE:  02/18/2015  PRIMARY SERVICE: PCCM  CHIEF COMPLAINT:  Suicide attempt  BRIEF PATIENT DESCRIPTION: 55 y/o man with bipolar d/o who attempted suicide with polysubstance overdose and slit wrist  SIGNIFICANT EVENTS / STUDIES:  LA >15 ARF L wrist stitched up in ED.  LINES / TUBES: PIV x2  CULTURES: None  ANTIBIOTICS: None  HISTORY OF PRESENT ILLNESS:   [name] [name] is a 55 y/o man with bipolar d/o and prior history of suicide ideations, but no apparent prior attempts who was found at home by his mother with slit wrist and empty pill bottles (Linsinopril, gabapentin, and quetiapine). He also probably took some metformin, but it is not clear how much he took. He has had worsening depression and then tonight before bed the patient mentioned to his mother that if he "ever needed to go life support" that he wouldn't want it. During the night she found him lying in the floor of his room with blood from a slit wrist and called EMS. He was also altered, but able to speak (he asked her to not call EMS). EMS found empty pill bottles although the metformin bottle was not empty, it is not known if or how much of that medication he took. In the ED he was found to be severely acidotic with profound metabolic acidosis.  PAST MEDICAL HISTORY :  Past Medical History  Diagnosis Date  . Depressed bipolar disorder ([place])   . Hypertension   . Diabetes mellitus without complication ([place])   . Sleep apnea   . Diabetic neuropathy [facility])    Past Surgical History  Procedure Laterality Date  . Spine surgery  2010  . Shoulder arthroscopy with rotator cuff repair Right 2009   Prior to Admission medications   Medication Sig Start Date End Date Taking? Authorizing Provider  buPROPion (WELLBUTRIN SR) 150 MG 12 hr tablet Take 150 mg by mouth 2 (two) times daily.     Historical Provider, MD  gabapentin ([name]) 600 MG tablet TAKE 1 TABLET MOUTH 3 TIMES DAY Patient taking differently: Take 600 mg by mouth as needed for diabetic neuropathy. 10/03/14   Lance Bosch, NP  lisinopril-hydrochlorothiazide (PRINZIDE,ZESTORETIC) 20-12.5 MG tablet Take 1 tablet by mouth daily. 10/17/14   Lance Bosch, NP  metFORMIN (GLUCOPHAGE) 500 MG tablet Take 1 tablet (500 mg total) by mouth 2 (two) times daily with a meal. 10/17/14   Lance Bosch, NP  methadone (DOLOPHINE) 10 MG/ML solution Take 70 mg by mouth daily.     Historical Provider, MD  neomycin-bacitracin-polymyxin (NEOSPORIN) ointment Apply 1 application topically every 12 (twelve) hours. apply to eye    Historical Provider, MD  omeprazole (PRILOSEC) 20 MG capsule Take 1 capsule (20 mg total) by mouth 2 (two) times daily before a meal. Patient taking differently: Take 20 mg by mouth daily as needed (indigestion/acid reflux).  01/01/14   Barton Dubois, MD  QUEtiapine (SEROQUEL XR) 300 MG 24 hr tablet Take 300 mg by mouth at bedtime.    Historical Provider, MD   Allergies  Allergen Reactions  . Oxytetracycline Rash  . Sulfonamide Derivatives Rash    FAMILY HISTORY:  Family History  Problem Relation Age of Onset  . Diabetes Mother   . Hyperlipidemia Mother   . Heart disease Father    SOCIAL HISTORY:  reports that he has never smoked. He does  not have any smokeless tobacco history on file. He reports that he does not drink alcohol or use illicit drugs.  REVIEW OF SYSTEMS:  Unable to obtain 2/2 AMS.  SUBJECTIVE:   VITAL SIGNS: Temp:  [97.4 F (36.3 C)] 97.4 F (36.3 C) (02/05 0444) Pulse Rate:  [85-91] 91 (02/05 0630) Resp:  [17-28] 23 (02/05 0639) BP: (42-168)/(32-140) 55/44 mmHg (02/05 0639) SpO2:  [95 %-100 %] 100 % (02/05 0630) Weight:  [220 lb (99.791 kg)] 220 lb (99.791 kg) (02/05 0444) HEMODYNAMICS:   VENTILATOR SETTINGS:   INTAKE / OUTPUT: Intake/Output      02/04 0701 - 02/05 0700    I.V. (mL/kg) 3000 (30.1)   Total Intake(mL/kg) 3000 (30.1)   Urine (mL/kg/hr) 300   Total Output 300   Net +2700         PHYSICAL EXAMINATION: General:  Middle aged man in moderate distress Neuro:  Somnolent, but able to aroused with noxious stimulus, but not responsive or alert. HEENT:  Dry mouth Neck: No LAD Cardiovascular:  Difficult exam due to mild agitation, but heart sounds are normal Lungs:  Difficult exam due to mild agitation, but breath sounds sounds are normal Abdomen:  Soft Musculoskeletal:  No swollen joints Skin:  No rashes  LABS:  CBC  Recent Labs Lab 02/18/15 0440  WBC 9.5  HGB 14.2  HCT 41.6  PLT 182   Coag's No results for input(s): APTT, INR in the last 168 hours. BMET  Recent Labs Lab 02/18/15 0440  NA 138  K 4.8  CL 97*  CO2 10*  BUN 29*  CREATININE 3.48*  GLUCOSE 278*   Electrolytes  Recent Labs Lab 02/18/15 0440  CALCIUM 9.4   Sepsis Markers  Recent Labs Lab 02/18/15 0548  LATICACIDVEN 15.39*   ABG  Recent Labs Lab 02/18/15 0555  PHART 7.113*  PCO2ART 20.7*  PO2ART 134.0*   Liver Enzymes  Recent Labs Lab 02/18/15 0440  AST 31  ALT 32  ALKPHOS 46  BILITOT 0.9  ALBUMIN 3.9   Cardiac Enzymes No results for input(s): TROPONINI, PROBNP in the last 168 hours. Glucose  Recent Labs Lab 02/18/15 0453  GLUCAP 236*    Imaging No results found.  EKG: NSR, Mild QTc prolongation CXR: Normal  ASSESSMENT / PLAN:  Active Problems:   Overdose   PULMONARY A: Respiratory Alkalosis compensating for severe metabolic acidosis Probable requirement for mechanical ventilation P:   Patient is too agitated to provide meaningful care as is (e.g. Place HD catheter, a-line, etc). Will plan to intubate patient once able to proceed to dialysis (expect inadequate minute ventilation on ventilator and thus worsening acidosis).  CARDIOVASCULAR A: Hypotension Lisinopril o/d P:   Unable to obtain consistent BP. Will  re-assess once intubated, may require A-line. May require pressors, levophed ordered for now.  RENAL A: Acute renal failure Severe lactic acidosis P:   Likely due to lisinopril o/d, given absolute value of creatinine, is unlikely to have developed overnight (Cr should not reach >3 within 12 hours). May have potentated the toxic effect of metformin. Suspect metformin toxicity given lactic acidosis.  GASTROINTESTINAL A: No acute issues P:    HEMATOLOGIC A: Slit wrist P:   Hb was normal on admission, unlikely to be a dangerous bleed.  INFECTIOUS A: No active issues P:    ENDOCRINE A: No active issues P:    Psych/NEUROLOGIC A: Suicide attempt AMS Polysubstance o/d P:   - Seroquel, gabapentin should clear with supportive care. - Will need  sitter  BEST PRACTICE / DISPOSITION Level of Care:  ICU Primary Service:  PCCM Consultants:  Nephro Code Status:  Full Diet:  NPO DVT Px:  Heparin GI Px:  None Skin Integrity:  Intact Social / Family:  Updated  Note: I had a long (~30 minute) conversation with the patient's brother and mother (via phone) re: his status. The patient's brother and mother mentioned that he had mentioned he had not wanted resuscitative efforts ("life support") in the event that "something should happen to [him]." These remarks were made shortly before his suicide attempt and while he was severely depressed on an earlier date. The patient's mother was perseverating that the patient had made these statements, and clearly was a source of consternation that we may not follow these stated wishes. The patient's mother was not demonstrating linear thought and was repeating herself and not convincingly able to understand my explanation that "not wanting life support" may refer to long-term efforts vs temporary measures, which are radically different. Apparently there is a living will somewhere. The son was angry with his mother for taking this stand and abruptly  hung up the phone. I stated that I was not comfortable acting on the wishes as stated by the mother given the major questions regarding scope. Given the uncertainty, we will continue with full support until further clarity and consensus is achieved.  TODAY'S SUMMARY: 55 y/o man with bipolar d/o presenting with polysubstance o/d and severe lactic acidosis concerning for metformin overdose.  I have personally obtained a history, examined the patient, evaluated laboratory and imaging results, formulated the assessment and plan and placed orders.  CRITICAL CARE: The patient is critically ill with multiple organ systems failure and requires high complexity decision making for assessment and support, frequent evaluation and titration of therapies, application of advanced monitoring technologies and extensive interpretation of multiple databases. Critical Care Time devoted to patient care services described in this note is 85 minutes.   Luz Brazen, MD Pulmonary and [place] Pager: [phone]   02/18/2015, 6:59 AM

## 2015-02-18 NOTE — ED Notes (Signed)
Paged critical care @ [phone]

## 2015-02-18 NOTE — Progress Notes (Signed)
eLink Physician-Brief Progress Note Patient Name: [name] [name] DOB: [birth year] MRN: [id number]   Date of Service  02/18/2015  HPI/Events of Note  Noticed that last blood glucose = 468.   eICU Interventions  Will order Novolog insulin IV infusion per protocol.      Intervention Category Major Interventions: Hyperglycemia - active titration of insulin therapy  Sommer,Steven Eugene 02/18/2015, 7:25 PM

## 2015-02-18 NOTE — Procedures (Signed)
Arterial Catheter Insertion Procedure Note [name] [name] [id number] Sep 02, 1960  Procedure: Insertion of Arterial Catheter  Indications: Blood pressure monitoring and Frequent blood sampling  Procedure Details Consent: Unable to obtain consent because of emergent medical necessity. Time Out: Verified patient identification, verified procedure, site/side was marked, verified correct patient position, special equipment/implants available, medications/allergies/relevent history reviewed, required imaging and test results available.  Performed  Maximum sterile technique was used including antiseptics, cap, gloves, gown, hand hygiene, mask and sheet. Skin prep: Chlorhexidine; local anesthetic administered 20 gauge catheter was inserted into left femoral artery using the Seldinger technique.  Evaluation Blood flow good; BP tracing good. Complications: No apparent complications.   [name] [name] 02/18/2015

## 2015-02-18 NOTE — Progress Notes (Signed)
Utilization review completed.  

## 2015-02-18 NOTE — ED Notes (Signed)
Security called to wand patient 

## 2015-02-18 NOTE — ED Notes (Signed)
Pt's brother at bedside. Pt was sitting up and trying to get oob when brother and this RN entering room.

## 2015-02-19 ENCOUNTER — Inpatient Hospital Stay (HOSPITAL_COMMUNITY): Payer: Self-pay

## 2015-02-19 LAB — RENAL FUNCTION PANEL
Albumin: 2.5 g/dL — ABNORMAL LOW (ref 3.5–5.0)
Anion gap: 24 — ABNORMAL HIGH (ref 5–15)
BUN: 30 mg/dL — ABNORMAL HIGH (ref 6–20)
CO2: 21 mmol/L — ABNORMAL LOW (ref 22–32)
Calcium: 7 mg/dL — ABNORMAL LOW (ref 8.9–10.3)
Chloride: 92 mmol/L — ABNORMAL LOW (ref 101–111)
Creatinine, Ser: 3.25 mg/dL — ABNORMAL HIGH (ref 0.61–1.24)
GFR calc Af Amer: 23 mL/min — ABNORMAL LOW (ref >60)
GFR calc non Af Amer: 20 mL/min — ABNORMAL LOW (ref >60)
Glucose, Bld: 142 mg/dL — ABNORMAL HIGH (ref 65–99)
Phosphorus: 3.1 mg/dL (ref 2.5–4.6)
Potassium: 3.4 mmol/L — ABNORMAL LOW (ref 3.5–5.1)
Sodium: 137 mmol/L (ref 135–145)

## 2015-02-19 LAB — HEPATIC FUNCTION PANEL
ALT: <5 U/L — ABNORMAL LOW (ref 17–63)
AST: 48 U/L — ABNORMAL HIGH (ref 15–41)
Albumin: 2.8 g/dL — ABNORMAL LOW (ref 3.5–5.0)
Alkaline Phosphatase: 43 U/L (ref 38–126)
Bilirubin, Direct: 0.1 mg/dL (ref 0.1–0.5)
Indirect Bilirubin: 0.8 mg/dL (ref 0.3–0.9)
Total Bilirubin: 0.9 mg/dL (ref 0.3–1.2)
Total Protein: 5.2 g/dL — ABNORMAL LOW (ref 6.5–8.1)

## 2015-02-19 LAB — CBC
HCT: 36.4 % — ABNORMAL LOW (ref 39.0–52.0)
Hemoglobin: 12.7 g/dL — ABNORMAL LOW (ref 13.0–17.0)
MCH: 28 pg (ref 26.0–34.0)
MCHC: 34.9 g/dL (ref 30.0–36.0)
MCV: 80.2 fL (ref 78.0–100.0)
Platelets: 268 K/uL (ref 150–400)
RBC: 4.54 MIL/uL (ref 4.22–5.81)
RDW: 14 % (ref 11.5–15.5)
WBC: 29.3 K/uL — ABNORMAL HIGH (ref 4.0–10.5)

## 2015-02-19 LAB — BASIC METABOLIC PANEL
Anion gap: 29 — ABNORMAL HIGH (ref 5–15)
BUN: 31 mg/dL — ABNORMAL HIGH (ref 6–20)
CO2: 12 mmol/L — ABNORMAL LOW (ref 22–32)
Calcium: 6.1 mg/dL — CL (ref 8.9–10.3)
Chloride: 97 mmol/L — ABNORMAL LOW (ref 101–111)
Creatinine, Ser: 3.8 mg/dL — ABNORMAL HIGH (ref 0.61–1.24)
GFR calc Af Amer: 19 mL/min — ABNORMAL LOW (ref >60)
GFR calc non Af Amer: 17 mL/min — ABNORMAL LOW (ref >60)
Glucose, Bld: 409 mg/dL — ABNORMAL HIGH (ref 65–99)
Potassium: 3.1 mmol/L — ABNORMAL LOW (ref 3.5–5.1)
Sodium: 138 mmol/L (ref 135–145)

## 2015-02-19 LAB — ETHYLENE GLYCOL: Ethylene Glycol Lvl: NOT DETECTED mg/dL

## 2015-02-19 LAB — GLUCOSE, CAPILLARY
Glucose-Capillary: 100 mg/dL — ABNORMAL HIGH (ref 65–99)
Glucose-Capillary: 126 mg/dL — ABNORMAL HIGH (ref 65–99)
Glucose-Capillary: 127 mg/dL — ABNORMAL HIGH (ref 65–99)
Glucose-Capillary: 132 mg/dL — ABNORMAL HIGH (ref 65–99)
Glucose-Capillary: 140 mg/dL — ABNORMAL HIGH (ref 65–99)
Glucose-Capillary: 162 mg/dL — ABNORMAL HIGH (ref 65–99)
Glucose-Capillary: 173 mg/dL — ABNORMAL HIGH (ref 65–99)
Glucose-Capillary: 182 mg/dL — ABNORMAL HIGH (ref 65–99)
Glucose-Capillary: 186 mg/dL — ABNORMAL HIGH (ref 65–99)
Glucose-Capillary: 239 mg/dL — ABNORMAL HIGH (ref 65–99)
Glucose-Capillary: 270 mg/dL — ABNORMAL HIGH (ref 65–99)
Glucose-Capillary: 294 mg/dL — ABNORMAL HIGH (ref 65–99)
Glucose-Capillary: 323 mg/dL — ABNORMAL HIGH (ref 65–99)
Glucose-Capillary: 330 mg/dL — ABNORMAL HIGH (ref 65–99)
Glucose-Capillary: 379 mg/dL — ABNORMAL HIGH (ref 65–99)
Glucose-Capillary: 388 mg/dL — ABNORMAL HIGH (ref 65–99)
Glucose-Capillary: 435 mg/dL — ABNORMAL HIGH (ref 65–99)
Glucose-Capillary: 435 mg/dL — ABNORMAL HIGH (ref 65–99)
Glucose-Capillary: 442 mg/dL — ABNORMAL HIGH (ref 65–99)
Glucose-Capillary: 442 mg/dL — ABNORMAL HIGH (ref 65–99)
Glucose-Capillary: 93 mg/dL (ref 65–99)

## 2015-02-19 LAB — LACTIC ACID, PLASMA
Lactic Acid, Venous: 13.6 mmol/L (ref 0.5–2.0)
Lactic Acid, Venous: 10.4 mmol/L (ref 0.5–2.0)
Lactic Acid, Venous: 12.3 mmol/L (ref 0.5–2.0)
Lactic Acid, Venous: 10.4 mmol/L (ref 0.5–2.0)
Lactic Acid, Venous: 8.7 mmol/L (ref 0.5–2.0)
Lactic Acid, Venous: 8.4 mmol/L (ref 0.5–2.0)
Lactic Acid, Venous: 10.1 mmol/L (ref 0.5–2.0)
Lactic Acid, Venous: 10.9 mmol/L (ref 0.5–2.0)

## 2015-02-19 LAB — URINALYSIS, ROUTINE W REFLEX MICROSCOPIC
Bilirubin Urine: NEGATIVE
Glucose, UA: 100 mg/dL — AB
Ketones, ur: 15 mg/dL — AB
Nitrite: NEGATIVE
Protein, ur: >300 mg/dL — AB
Specific Gravity, Urine: 1.02 (ref 1.005–1.030)
pH: 7.5 (ref 5.0–8.0)

## 2015-02-19 LAB — APTT
aPTT: >200 seconds (ref 24–37)
aPTT: >200 seconds (ref 24–37)
aPTT: 97 seconds — ABNORMAL HIGH (ref 24–37)

## 2015-02-19 LAB — URINE MICROSCOPIC-ADD ON

## 2015-02-19 LAB — METHEMOGLOBIN: Methemoglobin: 0.8 % (ref 0.0–1.5)

## 2015-02-19 LAB — POCT ACTIVATED CLOTTING TIME
Activated Clotting Time: 265 seconds
Activated Clotting Time: 219 seconds
Activated Clotting Time: 245 seconds
Activated Clotting Time: 204 seconds
Activated Clotting Time: 183 seconds
Activated Clotting Time: 157 seconds
Activated Clotting Time: 168 seconds
Activated Clotting Time: 209 seconds
Activated Clotting Time: 219 seconds

## 2015-02-19 LAB — NA AND K (SODIUM & POTASSIUM), RAND UR
Potassium Urine: 17 mmol/L
Sodium, Ur: 42 mmol/L

## 2015-02-19 LAB — CREATININE, URINE, RANDOM: Creatinine, Urine: 122.67 mg/dL

## 2015-02-19 LAB — VOLATILES,BLD-ACETONE,ETHANOL,ISOPROP,METHANOL
Acetone, blood: NEGATIVE % (ref 0.000–0.010)
Ethanol, blood: NEGATIVE % (ref 0.000–0.010)
Isopropanol, blood: NEGATIVE % (ref 0.000–0.010)
Methanol, blood: NEGATIVE % (ref 0.000–0.010)

## 2015-02-19 LAB — PHOSPHORUS: Phosphorus: 4.2 mg/dL (ref 2.5–4.6)

## 2015-02-19 LAB — CORTISOL: Cortisol, Plasma: 37.4 ug/dL

## 2015-02-19 LAB — PROTIME-INR
INR: 1.86 — ABNORMAL HIGH (ref 0.00–1.49)
Prothrombin Time: 21.4 seconds — ABNORMAL HIGH (ref 11.6–15.2)

## 2015-02-19 LAB — MAGNESIUM: Magnesium: 1.8 mg/dL (ref 1.7–2.4)

## 2015-02-19 MED ORDER — SODIUM CHLORIDE 0.9 % IV SOLN
1.0000 mg/kg/h | INTRAVENOUS | Status: DC
  Filled 2015-02-19 (×2): qty 100

## 2015-02-19 MED ORDER — POTASSIUM CHLORIDE 20 MEQ/15ML (10%) PO SOLN
40.0000 meq | Freq: Once | ORAL | Status: AC
  Administered 2015-02-19: 40 meq
  Filled 2015-02-19: qty 30

## 2015-02-19 MED ORDER — VITAL HIGH PROTEIN PO LIQD
1000.0000 mL | ORAL | Status: DC
  Administered 2015-02-19: 1000 mL
  Administered 2015-02-20 (×3)
  Administered 2015-02-20: 1000 mL
  Administered 2015-02-20: 08:00:00

## 2015-02-19 MED ORDER — PRO-STAT SUGAR FREE PO LIQD
60.0000 mL | Freq: Three times a day (TID) | ORAL | Status: DC
  Administered 2015-02-19 – 2015-02-21 (×6): 60 mL
  Filled 2015-02-19 (×10): qty 60

## 2015-02-19 MED ORDER — STERILE WATER FOR INJECTION IV SOLN
INTRAVENOUS | Status: DC
  Administered 2015-02-19: 07:00:00 via INTRAVENOUS
  Filled 2015-02-19 (×3): qty 850

## 2015-02-19 MED ORDER — SODIUM CHLORIDE 0.9 % IV SOLN
1.0000 mg/kg/h | Freq: Once | INTRAVENOUS | Status: AC
  Administered 2015-02-19: 1 mg/kg/h via INTRAVENOUS
  Filled 2015-02-19: qty 100

## 2015-02-19 MED ORDER — SODIUM CHLORIDE 0.9 % IV SOLN
10.0000 mg/kg | INTRAVENOUS | Status: AC
  Administered 2015-02-19 (×4): 1000 mg via INTRAVENOUS
  Filled 2015-02-19 (×6): qty 1

## 2015-02-19 NOTE — Progress Notes (Signed)
CRITICAL VALUE ALERT  Critical value received: PTT >200  Date of notification:  02/19/2015  Time of notification:  0625  Critical value read back:Yes.    Nurse who received alert:  Fayrene Helper'  MD notified (1st page):  Dr. Ashok Cordia  Time of first page:  0626  MD notified (2nd page):  Time of second page:  Responding MD:  Dr. Ashok Cordia  Time MD responded:  [phone]

## 2015-02-19 NOTE — Progress Notes (Signed)
CRITICAL VALUE ALERT  Critical value received:  LA 10.4  Date of notification:  02/19/15  Time of notification:  [id number]  Critical value read back:Yes.    Nurse who received alert:  Allegra Grana, RN  MD notified (1st page):  Dr Vaughan Browner  Time of first page:  65  MD notified (2nd page):  Time of second page:  Responding MD:  Dr Vaughan Browner  Time MD responded:  1024

## 2015-02-19 NOTE — Progress Notes (Signed)
eLink Physician-Brief Progress Note Patient Name: [name] [name] DOB: [birth year] MRN: [id number]   Date of Service  02/19/2015  HPI/Events of Note  RN notified of recs by Poison Control to increase Fomepizole to q4hr while on CVVHD.  eICU Interventions  1. Changed to q4hr for 4 doses.     Intervention Category Major Interventions: Other:  Tera Partridge 02/19/2015, 1:50 AM

## 2015-02-19 NOTE — Progress Notes (Signed)
CRITICAL VALUE ALERT  Critical value received:  PTT > 200  Date of notification:  02/19/15  Time of notification:  0910  Critical value read back:Yes.    Nurse who received alert:  Allegra Grana, RN  MD notified (1st page):  Dr Vaughan Browner and Dr Mercy Moore  Time of first page:  [phone]  MD notified (2nd page):  Time of second page:  Responding MD:  Dr Vaughan Browner  Time MD responded:  [phone]

## 2015-02-19 NOTE — Progress Notes (Signed)
CRITICAL VALUE ALERT  Critical value received: lactic acid 13.6  Date of notification:  02/19/2015  Time of notification:  01:42  Critical value read back:Yes.    Nurse who received alert:  Fayrene Helper  MD notified (1st page):  Dr. Ashok Cordia  Time of first page:  01:45  MD notified (2nd page):  Time of second page:  Responding MD:  Dr. Ashok Cordia  Time MD responded:  01:50

## 2015-02-19 NOTE — Progress Notes (Signed)
[facility] Progress Note    Assessment/ Plan:   1. Acute renal failure: Possibly hemodynamically mediated with lisinopril/hydrochlorothiazide and metformin overdose. Current hypotension also raising possibility for ischemic ATN. Will undertake CRRT for management of metabolic acidosis/metformin overdose with no ultrafiltration to minimize hemodynamic impact-clinically without evidence of volume overload. APTT elevated to >200. Will obtain urinalysis/urine electrolytes and order renal ultrasound. 2. Metformin overdose/toxicity with lactic acidosis: With severe anion gap metabolic acidosis, elevated lactic acid levels and depressed mentation on admission, on extracorporeal therapy/CRRT and sodium bicarb gtt.   3. Suicidal attempt: Status post repair of left slit wrist and will undertake hemodialysis for management of profound metabolic acidosis from metformin toxicity. Also on fomepizaole and methylene blue for concerns of ethylene glycol OD (labs pending).  4. Hypotension: Possibly from overdose with antihypertensive medications and profound metabolic acidosis. Currently on Norepinephrine, Phenylephrine and Vasopressin IV infusion as well as Solu-cortef.   Subjective:   Patient intubated. RNs have been able to decrease Levophed to 21mcg. Still on Neo (483mcg/min) and vasopressin (0.03 units/min))   Objective:   BP 105/58 mmHg  Pulse 90  Temp(Src) 97.4 F (36.3 C) (Oral)  Resp 23  Ht 5\' 9"  (1.753 m)  Wt 254 lb 3.1 oz (115.3 kg)  BMI 37.52 kg/m2  SpO2 99%  Intake/Output Summary (Last 24 hours) at 02/19/15 0703 Last data filed at 02/19/15 0600  Gross per 24 hour  Intake 9519.06 ml  Output   1776 ml  Net 7743.06 ml   Weight change: 34 lb 3.1 oz (15.509 kg)  Physical Exam: General: Lying in bed intubated, sedated.  Cardiovascular: RRR. No murmurs, rubs, or gallops noted. No pitting edema noted. Respiratory: No increased WOB. CTAB over the anterior chest wall without  wheezing, rhonchi, or crackles noted. Abdomen: +BS, soft, non-distended, non-tender.  Skin: left wrist dressing clean/dry.     Imaging: Dg Chest Port 1 View  02/18/2015  CLINICAL DATA:  Attempted central line placement. Assess for pneumothorax. Initial encounter. EXAM: PORTABLE CHEST 1 VIEW COMPARISON:  Chest radiograph performed earlier today at 9:06 a.m. FINDINGS: The patient's endotracheal tube is seen ending 6 cm above the carina. No central line is seen. An enteric tube is noted extending below the diaphragm. No pneumothorax is seen. The lungs appear relatively clear. No focal consolidation or pleural effusion is identified. The cardiomediastinal silhouette is normal in size. No acute osseous abnormalities are identified. There is chronic resorption or resection of the distal right clavicle. IMPRESSION: 1. No evidence of pneumothorax.  No central line seen at this time. 2. Endotracheal tube seen ending 6 cm above the carina. 3. Lungs remain relatively clear. Electronically Signed   By: Garald Balding M.D.   On: 02/18/2015 21:58   Dg Chest Port 1v Same Day  02/18/2015  CLINICAL DATA:  Brought to the emergency room after found at home by his mother with a slit left wrist and empty medication bottles (lisinopril/HCTZ, gabapentin and quetiapine) and partially empty metformin bottle. Verify intubation and NG tube placement. EXAM: PORTABLE CHEST 1 VIEW COMPARISON:  Radiograph 12/31/2013 FINDINGS: Endotracheal tube 6 cm from carina. NG tube extends to the stomach. Normal cardiac silhouette. Lungs are clear. IMPRESSION: Endotracheal tube in good position.  NG tube extends the stomach. Lungs are clear. Electronically Signed   By: Suzy Bouchard M.D.   On: 02/18/2015 09:36   Dg Abd Portable 1v  02/18/2015  CLINICAL DATA:  Evaluate NG tube placement. EXAM: PORTABLE ABDOMEN - 1 VIEW COMPARISON:  Abdominal  radiograph 01/27/2008 FINDINGS: Enteric tube tip and side-port project over the left upper quadrant.  Nonobstructed bowel gas pattern. Unremarkable osseous skeleton. Nonspecific catheter tubing projecting over the left lower quadrant. IMPRESSION: Enteric tube tip and side-port project over the left upper quadrant, likely within the stomach. Indeterminate tubing projecting over the left lower quadrant. Recommend clinical correlation. Electronically Signed   By: Lovey Newcomer M.D.   On: 02/18/2015 09:33    Labs: BMET  Recent Labs Lab 02/18/15 0440 02/18/15 0802 02/18/15 1534 02/19/15 0430  NA 138  --  143 138  K 4.8  --  5.7* 3.1*  CL 97*  --  103 97*  CO2 10*  --  <7* 12*  GLUCOSE 278*  --  468* 409*  BUN 29*  --  40* 31*  CREATININE 3.48* 3.73* 4.83* 3.80*  CALCIUM 9.4  --  7.2* 6.1*  PHOS  --   --  14.5* 4.2   CBC  Recent Labs Lab 02/18/15 0440 02/18/15 0802 02/18/15 2224 02/19/15 0430  WBC 9.5 9.1 27.5* 29.3*  NEUTROABS 7.6  --   --   --   HGB 14.2 12.6* 11.7* 12.7*  HCT 41.6 38.2* 34.8* 36.4*  MCV 81.6 84.5 83.5 80.2  PLT 182 159 218 268    Medications:    . antiseptic oral rinse  7 mL Mouth Rinse QID  . chlorhexidine gluconate  15 mL Mouth Rinse BID  . fomepizole (ANTIZOL) IV  10 mg/kg Intravenous Q4H  . heparin  5,000 Units Subcutaneous 3 times per day  . hydrocortisone sod succinate (SOLU-CORTEF) inj  50 mg Intravenous Q6H  . pantoprazole (PROTONIX) IV  40 mg Intravenous Daily      Kathrine Cords, MD [place] Resident, PGY-2 02/19/2015, 7:03 AM  I have seen and examined this patient and agree with plan per Dr Lorenso Courier.  UO minimal.  Bicarb slowly improving. He doesn't need 2 bicarb gtts so will DC one of them.  Ca low will replace.   Dylyn Mclaren T,MD 02/19/2015 9:04 AM

## 2015-02-19 NOTE — Progress Notes (Signed)
Poison control called again and we will be sending out a stat Ethylene Glycol to [facility] in [place]. Per Poison Control this needs to be collected in 2 purple top tubes. This was relayed to the [facility] and lab. These labs will be hand delievered to lab and lab is aware.

## 2015-02-19 NOTE — Progress Notes (Signed)
eLink Physician-Brief Progress Note Patient Name: [name] [name] DOB: Jan 11, 1961 MRN: [id number]   Date of Service  02/19/2015  HPI/Events of Note  RN notified of PTT >200. Currently on Heparin via CVVHD & Heparin drip.   eICU Interventions  1. Holding heparin drip peripheral 2. Continue Heparin drip through CVVHD 3. Repeat PTT in 1 hour     Intervention Category Major Interventions: Other:  Tera Partridge 02/19/2015, 6:30 AM

## 2015-02-19 NOTE — Care Management Note (Addendum)
Case Management Note  Patient Details  Name: [name] [name] MRN: [id number] Date of Birth: [birth year]  Subjective/Objective:    Pt admitted post suicide attempt, pt recently discharge from substance abuse facility (approximately 1 week)                Action/Plan:  Pt is from home, recent discharge from substance abuse facility.  Pt is now on ventilator and undergoing CRRT.  CM consulted CSW to assist in discharge plan.  CM will continue to monitor for disposition needs.   Expected Discharge Date:                  [facility]  In-House Referral:  Clinical Social Work  Discharge planning Services  CM Consult  Post Acute Care Choice:    Choice offered to:     DME Arranged:    DME Agency:     HH Arranged:    [place] Agency:     Status of Service:  In process, will continue to follow  Medicare Important Message Given:    Date Medicare IM Given:    Medicare IM give by:    Date Additional Medicare IM Given:    Additional Medicare Important Message give by:     If discussed at [place] of Stay Meetings, dates discussed:    Additional Comments: No family at bedside Maryclare Labrador, RN 02/19/2015, 1:43 PM

## 2015-02-19 NOTE — Progress Notes (Signed)
CRITICAL VALUE ALERT  Critical value received:  Lactic acid 10.4  Date of notification:  02/19/2015  Time of notification:  03:35  Critical value read back:Yes.    Nurse who received alert:  Fayrene Helper  MD notified (1st page):  Dr. Ashok Cordia  Time of first page:  03:36  MD notified (2nd page):  Time of second page:  Responding MD:  Dr. Ashok Cordia  Time MD responded:  03:36

## 2015-02-19 NOTE — Progress Notes (Signed)
CRITICAL VALUE ALERT  Critical value received:  Calcium 6.1, lactic acid 12.3  Date of notification:  02/19/2015  Time of notification:  0550  Critical value read back:Yes.    Nurse who received alert:  Fayrene Helper  MD notified (1st page):  Dr. Ashok Cordia  Time of first page:  [phone]  MD notified (2nd page):  Time of second page:  Responding MD:  Dr. Ashok Cordia  Time MD responded:  [phone]

## 2015-02-19 NOTE — Progress Notes (Signed)
Spoke to [employer]. Per their recommendation they would like the pt to have an ECHO. They also recommend to continue the Fomepizole q 12hours now instead of q 4 and to increase the dose to 15mg /kg. Poison Control also wants to send out a stat Ethylene Gylcol and Methanol.  This information relayed to Dr Halford Chessman at [facility]. Called Poison Control and made them aware.

## 2015-02-19 NOTE — Progress Notes (Signed)
CRITICAL VALUE ALERT  Critical value received:  LA 10.4  Date of notification:  02/19/15  Time of notification:  1911  Critical value read back:Yes.    Nurse who received alert:  Allegra Grana, RN  MD notified (1st page):  Dr Halford Chessman  Time of first page:  1911  MD notified (2nd page):  Time of second page:  Responding MD:  Dr Halford Chessman  Time MD responded:  1911   Also relayed that pt is requiring more BP pressor support.

## 2015-02-19 NOTE — H&P (Signed)
PULMONARY / CRITICAL CARE MEDICINE HISTORY AND PHYSICAL EXAMINATION   Name: [name] [name] MRN: [id number] DOB: 04/24/1960    ADMISSION DATE:  02/18/2015  PRIMARY SERVICE: PCCM  CHIEF COMPLAINT:  Suicide attempt  BRIEF PATIENT DESCRIPTION: 55 y/o man with bipolar d/o who attempted suicide with polysubstance overdose and slit wrist  SIGNIFICANT EVENTS / STUDIES:  LA >15 ARF L wrist stitched up in ED. 2/5- Started on CVVH.  LINES / TUBES: PIV x2  CULTURES: None  ANTIBIOTICS: None  HISTORY OF PRESENT ILLNESS:   [name] [name] is a 55 y/o man with bipolar d/o and prior history of suicide ideations, but no apparent prior attempts who was found at home by his mother with slit wrist and empty pill bottles (Linsinopril, gabapentin, and quetiapine). He also probably took some metformin, but it is not clear how much he took. He has had worsening depression and then tonight before bed the patient mentioned to his mother that if he "ever needed to go life support" that he wouldn't want it. During the night she found him lying in the floor of his room with blood from a slit wrist and called EMS. He was also altered, but able to speak (he asked her to not call EMS). EMS found empty pill bottles although the metformin bottle was not empty, it is not known if or how much of that medication he took. In the ED he was found to be severely acidotic with profound metabolic acidosis.  PAST MEDICAL HISTORY :  Past Medical History  Diagnosis Date  . Depressed bipolar disorder ([place])   . Hypertension   . Diabetes mellitus without complication ([place])   . Sleep apnea   . Diabetic neuropathy [facility])    Past Surgical History  Procedure Laterality Date  . Spine surgery  2010  . Shoulder arthroscopy with rotator cuff repair Right 2009   Prior to Admission medications   Medication Sig Start Date End Date Taking? Authorizing Provider  buPROPion (WELLBUTRIN SR) 150 MG 12 hr tablet Take 150 mg by mouth 2  (two) times daily.    Historical Provider, MD  gabapentin ([name]) 600 MG tablet TAKE 1 TABLET MOUTH 3 TIMES DAY Patient taking differently: Take 600 mg by mouth as needed for diabetic neuropathy. 10/03/14   Lance Bosch, NP  lisinopril-hydrochlorothiazide (PRINZIDE,ZESTORETIC) 20-12.5 MG tablet Take 1 tablet by mouth daily. 10/17/14   Lance Bosch, NP  metFORMIN (GLUCOPHAGE) 500 MG tablet Take 1 tablet (500 mg total) by mouth 2 (two) times daily with a meal. 10/17/14   Lance Bosch, NP  methadone (DOLOPHINE) 10 MG/ML solution Take 70 mg by mouth daily.     Historical Provider, MD  neomycin-bacitracin-polymyxin (NEOSPORIN) ointment Apply 1 application topically every 12 (twelve) hours. apply to eye    Historical Provider, MD  omeprazole (PRILOSEC) 20 MG capsule Take 1 capsule (20 mg total) by mouth 2 (two) times daily before a meal. Patient taking differently: Take 20 mg by mouth daily as needed (indigestion/acid reflux).  01/01/14   Barton Dubois, MD  QUEtiapine (SEROQUEL XR) 300 MG 24 hr tablet Take 300 mg by mouth at bedtime.    Historical Provider, MD   Allergies  Allergen Reactions  . Oxytetracycline Rash  . Sulfonamide Derivatives Rash    FAMILY HISTORY:  Family History  Problem Relation Age of Onset  . Diabetes Mother   . Hyperlipidemia Mother   . Heart disease Father    SOCIAL HISTORY:  reports that he has  never smoked. He does not have any smokeless tobacco history on file. He reports that he does not drink alcohol or use illicit drugs.  REVIEW OF SYSTEMS:  Unable to obtain 2/2 AMS.  SUBJECTIVE:  Stable, continues on multiple pressors (Levo, vaso, Neo). Acidosis is improving.  VITAL SIGNS: Temp:  [94.5 F (34.7 C)-99.7 F (37.6 C)] 97.5 F (36.4 C) (02/06 0814) Pulse Rate:  [70-103] 90 (02/06 1000) Resp:  [18-28] 25 (02/06 1000) BP: (59-137)/(35-101) 123/66 mmHg (02/06 0800) SpO2:  [96 %-100 %] 98 % (02/06 1000) Arterial Line BP: (49-256)/(27-251) 121/62 mmHg  (02/06 1000) FiO2 (%):  [40 %-50 %] 40 % (02/06 0800) Weight:  [254 lb 3.1 oz (115.3 kg)] 254 lb 3.1 oz (115.3 kg) (02/06 0500) HEMODYNAMICS:   VENTILATOR SETTINGS: Vent Mode:  [-] PCV FiO2 (%):  [40 %-50 %] 40 % Set Rate:  [20 bmp-25 bmp] 25 bmp PEEP:  [5 cmH20] 5 cmH20 Pressure Support:  [10 cmH20] 10 cmH20 Plateau Pressure:  [20 cmH20-28 cmH20] 20 cmH20 INTAKE / OUTPUT: Intake/Output      02/05 0701 - 02/06 0700 02/06 0701 - 02/07 0700   I.V. (mL/kg) 6620.3 (57.4) 1094.8 (9.5)   IV Piggyback 3300    Total Intake(mL/kg) 9920.3 (86) 1094.8 (9.5)   Urine (mL/kg/hr) 35 (0) 0 (0)   Other 2093 (0.8) 893 (2.3)   Total Output 2128 893   Net +7792.3 +201.8          PHYSICAL EXAMINATION: General:  Sedated, unresponsive. No distress Neuro:  PERRL, no gross focal deficits HEENT:  No thyromegaly, JVD Cardiovascular:  RRR. No MRG Lungs:  Clear, No crackles, wheezing Abdomen:  Soft, + BS Musculoskeletal:  No swollen joints Skin:  No rashes  LABS:  CBC  Recent Labs Lab 02/18/15 0802 02/18/15 2224 02/19/15 0430  WBC 9.1 27.5* 29.3*  HGB 12.6* 11.7* 12.7*  HCT 38.2* 34.8* 36.4*  PLT 159 218 268   Coag's  Recent Labs Lab 02/18/15 2224 02/19/15 0430 02/19/15 0733  APTT  --  >200* >200*  INR 1.98* 1.86*  --    BMET  Recent Labs Lab 02/18/15 0440 02/18/15 0802 02/18/15 1534 02/19/15 0430  NA 138  --  143 138  K 4.8  --  5.7* 3.1*  CL 97*  --  103 97*  CO2 10*  --  <7* 12*  BUN 29*  --  40* 31*  CREATININE 3.48* 3.73* 4.83* 3.80*  GLUCOSE 278*  --  468* 409*   Electrolytes  Recent Labs Lab 02/18/15 0440 02/18/15 1534 02/19/15 0430  CALCIUM 9.4 7.2* 6.1*  MG  --   --  1.8  PHOS  --  14.5* 4.2   Sepsis Markers  Recent Labs Lab 02/19/15 0237 02/19/15 0430 02/19/15 0938  LATICACIDVEN 10.4* 12.3* 10.4*   ABG  Recent Labs Lab 02/18/15 1915 02/18/15 2301 02/19/15 0310  PHART 7.203* 7.110* 7.267*  PCO2ART 10.2* 19.1* 20.2*  PO2ART 214*  204.0* 130*   Liver Enzymes  Recent Labs Lab 02/18/15 0440 02/18/15 1534 02/18/15 2224 02/19/15 0430  AST 31  --  34 48*  ALT 32  --  23 <5*  ALKPHOS 46  --  35* 43  BILITOT 0.9  --  1.0 0.9  ALBUMIN 3.9 2.8* 2.4* 2.8*   Cardiac Enzymes No results for input(s): TROPONINI, PROBNP in the last 168 hours. Glucose  Recent Labs Lab 02/19/15 0322 02/19/15 0426 02/19/15 0527 02/19/15 0631 02/19/15 0731 02/19/15 0828  GLUCAP 388* 379* 330*  323* 294* 239*    Imaging Dg Chest Port 1 View  02/19/2015  CLINICAL DATA:  Respiratory failure. EXAM: PORTABLE CHEST 1 VIEW COMPARISON:  02/18/2015. FINDINGS: Endotracheal tube and NG tube in stable position. Cardiomegaly. Low lung volumes with bibasilar atelectasis and/or mild infiltrates. No pleural effusion or pneumothorax IMPRESSION: 1. Lines and tubes in stable position. 2. Lung volumes with mild bibasilar atelectasis and/or infiltrates. Electronically Signed   By: Marcello Moores  Register   On: 02/19/2015 07:15   Dg Chest Port 1 View  02/18/2015  CLINICAL DATA:  Attempted central line placement. Assess for pneumothorax. Initial encounter. EXAM: PORTABLE CHEST 1 VIEW COMPARISON:  Chest radiograph performed earlier today at 9:06 a.m. FINDINGS: The patient's endotracheal tube is seen ending 6 cm above the carina. No central line is seen. An enteric tube is noted extending below the diaphragm. No pneumothorax is seen. The lungs appear relatively clear. No focal consolidation or pleural effusion is identified. The cardiomediastinal silhouette is normal in size. No acute osseous abnormalities are identified. There is chronic resorption or resection of the distal right clavicle. IMPRESSION: 1. No evidence of pneumothorax.  No central line seen at this time. 2. Endotracheal tube seen ending 6 cm above the carina. 3. Lungs remain relatively clear. Electronically Signed   By: Garald Balding M.D.   On: 02/18/2015 21:58   Dg Chest Port 1v Same Day  02/18/2015   CLINICAL DATA:  Brought to the emergency room after found at home by his mother with a slit left wrist and empty medication bottles (lisinopril/HCTZ, gabapentin and quetiapine) and partially empty metformin bottle. Verify intubation and NG tube placement. EXAM: PORTABLE CHEST 1 VIEW COMPARISON:  Radiograph 12/31/2013 FINDINGS: Endotracheal tube 6 cm from carina. NG tube extends to the stomach. Normal cardiac silhouette. Lungs are clear. IMPRESSION: Endotracheal tube in good position.  NG tube extends the stomach. Lungs are clear. Electronically Signed   By: Suzy Bouchard M.D.   On: 02/18/2015 09:36   Dg Abd Portable 1v  02/18/2015  CLINICAL DATA:  Evaluate NG tube placement. EXAM: PORTABLE ABDOMEN - 1 VIEW COMPARISON:  Abdominal radiograph 01/27/2008 FINDINGS: Enteric tube tip and side-port project over the left upper quadrant. Nonobstructed bowel gas pattern. Unremarkable osseous skeleton. Nonspecific catheter tubing projecting over the left lower quadrant. IMPRESSION: Enteric tube tip and side-port project over the left upper quadrant, likely within the stomach. Indeterminate tubing projecting over the left lower quadrant. Recommend clinical correlation. Electronically Signed   By: Lovey Newcomer M.D.   On: 02/18/2015 09:33    EKG: NSR, Mild QTc prolongation CXR 2/6: No infiltrates.  ASSESSMENT / PLAN:  Active Problems:   Overdose   Acute respiratory failure with hypoxia (HCC)   Arterial hypotension   AKI (acute kidney injury) ([place])   Acute renal failure (ARF) (HCC)   Lactic acidosis   Shock circulatory (HCC)   Metabolic acidosis   PULMONARY A: Respiratory Alkalosis compensating for severe metabolic acidosis Intubated for resp protection P:   Continue full vent support. No plan for weaning he is too unstable.  CARDIOVASCULAR A: Hypotension- Likely secondary to drug OD, acidosis. Lisinopril o/d P:   On 3 pressors Weaning down levo as tolerated Stress dose  steroids  RENAL A: Acute renal failure Severe lactic acidosis P:   Continue CVVH   GASTROINTESTINAL A: No acute issues P:    HEMATOLOGIC A: Slit wrist Elevated PTT P:   Elevated PTT. Taken off heparin drip peripherally.  Continue to monitor coags.  INFECTIOUS A: No active issues P:    ENDOCRINE A: No active issues P:    Psych/NEUROLOGIC A: Suicide attempt AMS Polysubstance o/d P:   - Seroquel, gabapentin should clear with supportive care. - On CVVH. - Continue fomiprazole for empiric treatment of alcohol ingestions. Follow levels (pending) - Got 1 dose methylene blue yesterday. Will hold off additional doeses and check meth hemoglobin levels.   BEST PRACTICE / DISPOSITION Level of Care:  ICU Primary Service:  PCCM Consultants:  Nephro Code Status:  Full Diet:  NPO DVT Px:  Heparin GI Px:  None Skin Integrity:  Intact Social / Family:  Updated  Note: Code status discussions noted from yesterday. Although the pt stated that he did not want to be on life support. But this was expressed when he was depressed. We will continue to heep him full code for now. Family including parents updated at bedside 2/6   TODAY'S SUMMARY: 55 y/o man with bipolar d/o presenting with polysubstance o/d and severe lactic acidosis concerning for metformin overdose.  Critical care time- 35 mins.  Marshell Garfinkel MD [facility] Pager [phone] If no answer or after 3pm call: [phone] 02/19/2015, 10:35 AM

## 2015-02-19 NOTE — Progress Notes (Signed)
CRITICAL VALUE ALERT  Critical value received:  LA 8.4  Date of notification:  02/19/15  Time of notification:  1605  Critical value read back:Yes.    Nurse who received alert:  Allegra Grana, RN  MD notified (1st page):  Warren Lacy MD  Time of first page:  1608  MD notified (2nd page):  Time of second page:  Responding MD:  Warren Lacy MD  Time MD responded:  [phone]

## 2015-02-19 NOTE — Progress Notes (Signed)
eLink Physician-Brief Progress Note Patient Name: [name] [name] DOB: 20-May-1960 MRN: [id number]   Date of Service  02/19/2015  HPI/Events of Note  RN notified of hypokalemia. Currently on CVVHD. Acidosis improving on Bicarb drip.  eICU Interventions  KCl 107mEq VT x1     Intervention Category Major Interventions: Electrolyte abnormality - evaluation and management  Tera Partridge 02/19/2015, 5:40 AM

## 2015-02-19 NOTE — Progress Notes (Signed)
CRITICAL VALUE ALERT  Critical value received:  Lactic acid 10.9  Date of notification:  02/19/2015  Time of notification:  2110  Critical value read back:Yes.    Nurse who received alert:  Fayrene Helper  MD notified (1st page):  Dr. Gerarda Fraction  Time of first page:  2115  MD notified (2nd page):  Time of second page:  Responding MD:  Dr. Gerarda Fraction  Time MD responded:  2125

## 2015-02-19 NOTE — Progress Notes (Signed)
Initial Nutrition Assessment  DOCUMENTATION CODES:   Obesity unspecified  INTERVENTION:    Initiate TF via OGT with Vital High protein at 25 ml/h and Prostat 60 ml TID on day 1; on day 2, increase to goal rate of 40 ml/h (960 ml per day) to provide 1560 kcals, 174 gm protein, 803 ml free water daily.  NUTRITION DIAGNOSIS:   Inadequate oral intake related to inability to eat as evidenced by NPO status.  GOAL:   Provide needs based on ASPEN/SCCM guidelines  MONITOR:   Vent status, Labs, Weight trends, TF tolerance, I & O's  REASON FOR ASSESSMENT:   Rounds (RD to order TF)    ASSESSMENT:   54 y/o man with bipolar d/o who attempted suicide with polysubstance overdose and slit wrist  Discussed patient in ICU rounds and with RN today. RD to order TF. Patient is currently receiving CRRT. Labs reviewed: potassium is low. Nutrition focused physical exam completed.  No muscle or subcutaneous fat depletion noticed.  Patient is currently intubated on ventilator support Temp (24hrs), Avg:98.2 F (36.8 C), Min:95.4 F (35.2 C), Max:99.7 F (37.6 C)   Diet Order:  Diet NPO time specified  Skin:  Reviewed, no issues  Last BM:  unknown  Height:   Ht Readings from Last 1 Encounters:  02/18/15 5\' 9"  (1.753 m)    Weight:   Wt Readings from Last 1 Encounters:  02/19/15 254 lb 3.1 oz (115.3 kg)    Ideal Body Weight:  72.7 kg  BMI:  Body mass index is 37.52 kg/(m^2).  Estimated Nutritional Needs:   Kcal:  [id number]  Protein:  182 gm  Fluid:  2 L  EDUCATION NEEDS:   No education needs identified at this time  Molli Barrows, [place], [place], [place] Pager [phone] After Hours Pager [phone]

## 2015-02-20 ENCOUNTER — Inpatient Hospital Stay (HOSPITAL_COMMUNITY): Payer: Self-pay

## 2015-02-20 DIAGNOSIS — T50902D Poisoning by unspecified drugs, medicaments and biological substances, intentional self-harm, subsequent encounter: Secondary | ICD-10-CM

## 2015-02-20 DIAGNOSIS — R0600 Dyspnea, unspecified: Secondary | ICD-10-CM

## 2015-02-20 DIAGNOSIS — J9690 Respiratory failure, unspecified, unspecified whether with hypoxia or hypercapnia: Secondary | ICD-10-CM | POA: Insufficient documentation

## 2015-02-20 LAB — TROPONIN I
Troponin I: 1.34 ng/mL (ref <0.031)
Troponin I: 1.1 ng/mL (ref <0.031)
Troponin I: 0.97 ng/mL (ref <0.031)

## 2015-02-20 LAB — POCT ACTIVATED CLOTTING TIME
Activated Clotting Time: 214 seconds
Activated Clotting Time: 224 seconds
Activated Clotting Time: 224 seconds
Activated Clotting Time: 229 seconds
Activated Clotting Time: 229 seconds
Activated Clotting Time: 229 seconds
Activated Clotting Time: 234 seconds
Activated Clotting Time: 234 seconds
Activated Clotting Time: 219 seconds
Activated Clotting Time: 224 seconds
Activated Clotting Time: 224 seconds
Activated Clotting Time: 229 seconds
Activated Clotting Time: 224 seconds
Activated Clotting Time: 229 seconds
Activated Clotting Time: 229 seconds
Activated Clotting Time: 234 seconds
Activated Clotting Time: 224 seconds

## 2015-02-20 LAB — HEPATIC FUNCTION PANEL
ALT: 37 U/L (ref 17–63)
AST: 54 U/L — ABNORMAL HIGH (ref 15–41)
Albumin: 2.4 g/dL — ABNORMAL LOW (ref 3.5–5.0)
Alkaline Phosphatase: 44 U/L (ref 38–126)
Bilirubin, Direct: 0.1 mg/dL (ref 0.1–0.5)
Indirect Bilirubin: 0.5 mg/dL (ref 0.3–0.9)
Total Bilirubin: 0.6 mg/dL (ref 0.3–1.2)
Total Protein: 5 g/dL — ABNORMAL LOW (ref 6.5–8.1)

## 2015-02-20 LAB — RENAL FUNCTION PANEL
Albumin: 2.4 g/dL — ABNORMAL LOW (ref 3.5–5.0)
Anion gap: 22 — ABNORMAL HIGH (ref 5–15)
BUN: 38 mg/dL — ABNORMAL HIGH (ref 6–20)
CO2: 21 mmol/L — ABNORMAL LOW (ref 22–32)
Calcium: 6.8 mg/dL — ABNORMAL LOW (ref 8.9–10.3)
Chloride: 89 mmol/L — ABNORMAL LOW (ref 101–111)
Creatinine, Ser: 3.58 mg/dL — ABNORMAL HIGH (ref 0.61–1.24)
GFR calc Af Amer: 21 mL/min — ABNORMAL LOW (ref >60)
GFR calc non Af Amer: 18 mL/min — ABNORMAL LOW (ref >60)
Glucose, Bld: 164 mg/dL — ABNORMAL HIGH (ref 65–99)
Phosphorus: 4.7 mg/dL — ABNORMAL HIGH (ref 2.5–4.6)
Potassium: 4.1 mmol/L (ref 3.5–5.1)
Sodium: 132 mmol/L — ABNORMAL LOW (ref 135–145)

## 2015-02-20 LAB — BASIC METABOLIC PANEL
Anion gap: 24 — ABNORMAL HIGH (ref 5–15)
BUN: 34 mg/dL — ABNORMAL HIGH (ref 6–20)
CO2: 18 mmol/L — ABNORMAL LOW (ref 22–32)
Calcium: 7.1 mg/dL — ABNORMAL LOW (ref 8.9–10.3)
Chloride: 91 mmol/L — ABNORMAL LOW (ref 101–111)
Creatinine, Ser: 3.49 mg/dL — ABNORMAL HIGH (ref 0.61–1.24)
GFR calc Af Amer: 21 mL/min — ABNORMAL LOW (ref >60)
GFR calc non Af Amer: 19 mL/min — ABNORMAL LOW (ref >60)
Glucose, Bld: 146 mg/dL — ABNORMAL HIGH (ref 65–99)
Potassium: 3.8 mmol/L (ref 3.5–5.1)
Sodium: 133 mmol/L — ABNORMAL LOW (ref 135–145)

## 2015-02-20 LAB — BLOOD GAS, ARTERIAL
Acid-base deficit: 5.9 mmol/L — ABNORMAL HIGH (ref 0.0–2.0)
Bicarbonate: 17.4 mEq/L — ABNORMAL LOW (ref 20.0–24.0)
Drawn by: 36527
FIO2: 0.4
O2 Saturation: 98.7 %
PEEP: 5 cmH2O
PIP: 20 cmH2O
Patient temperature: 99
RATE: 25 resp/min
TCO2: 18.2 mmol/L (ref 0–100)
pCO2 arterial: 25.9 mmHg — ABNORMAL LOW (ref 35.0–45.0)
pH, Arterial: 7.444 (ref 7.350–7.450)
pO2, Arterial: 114 mmHg — ABNORMAL HIGH (ref 80.0–100.0)

## 2015-02-20 LAB — LACTIC ACID, PLASMA
Lactic Acid, Venous: 10.3 mmol/L (ref 0.5–2.0)
Lactic Acid, Venous: 10.3 mmol/L (ref 0.5–2.0)
Lactic Acid, Venous: 11.1 mmol/L (ref 0.5–2.0)
Lactic Acid, Venous: 11.4 mmol/L (ref 0.5–2.0)
Lactic Acid, Venous: 11.9 mmol/L (ref 0.5–2.0)
Lactic Acid, Venous: 12.3 mmol/L (ref 0.5–2.0)
Lactic Acid, Venous: 12.6 mmol/L (ref 0.5–2.0)
Lactic Acid, Venous: 9.8 mmol/L (ref 0.5–2.0)

## 2015-02-20 LAB — GLUCOSE, CAPILLARY
Glucose-Capillary: 140 mg/dL — ABNORMAL HIGH (ref 65–99)
Glucose-Capillary: 144 mg/dL — ABNORMAL HIGH (ref 65–99)
Glucose-Capillary: 153 mg/dL — ABNORMAL HIGH (ref 65–99)
Glucose-Capillary: 154 mg/dL — ABNORMAL HIGH (ref 65–99)
Glucose-Capillary: 156 mg/dL — ABNORMAL HIGH (ref 65–99)
Glucose-Capillary: 157 mg/dL — ABNORMAL HIGH (ref 65–99)
Glucose-Capillary: 164 mg/dL — ABNORMAL HIGH (ref 65–99)
Glucose-Capillary: 167 mg/dL — ABNORMAL HIGH (ref 65–99)
Glucose-Capillary: 153 mg/dL — ABNORMAL HIGH (ref 65–99)
Glucose-Capillary: 152 mg/dL — ABNORMAL HIGH (ref 65–99)
Glucose-Capillary: 114 mg/dL — ABNORMAL HIGH (ref 65–99)
Glucose-Capillary: 163 mg/dL — ABNORMAL HIGH (ref 65–99)
Glucose-Capillary: 164 mg/dL — ABNORMAL HIGH (ref 65–99)
Glucose-Capillary: 165 mg/dL — ABNORMAL HIGH (ref 65–99)
Glucose-Capillary: 166 mg/dL — ABNORMAL HIGH (ref 65–99)
Glucose-Capillary: 168 mg/dL — ABNORMAL HIGH (ref 65–99)
Glucose-Capillary: 146 mg/dL — ABNORMAL HIGH (ref 65–99)
Glucose-Capillary: 160 mg/dL — ABNORMAL HIGH (ref 65–99)
Glucose-Capillary: 174 mg/dL — ABNORMAL HIGH (ref 65–99)
Glucose-Capillary: 147 mg/dL — ABNORMAL HIGH (ref 65–99)
Glucose-Capillary: 150 mg/dL — ABNORMAL HIGH (ref 65–99)
Glucose-Capillary: 169 mg/dL — ABNORMAL HIGH (ref 65–99)
Glucose-Capillary: 169 mg/dL — ABNORMAL HIGH (ref 65–99)
Glucose-Capillary: 149 mg/dL — ABNORMAL HIGH (ref 65–99)
Glucose-Capillary: 159 mg/dL — ABNORMAL HIGH (ref 65–99)
Glucose-Capillary: 160 mg/dL — ABNORMAL HIGH (ref 65–99)
Glucose-Capillary: 160 mg/dL — ABNORMAL HIGH (ref 65–99)
Glucose-Capillary: 159 mg/dL — ABNORMAL HIGH (ref 65–99)

## 2015-02-20 LAB — CK: Total CK: 2257 U/L — ABNORMAL HIGH (ref 49–397)

## 2015-02-20 LAB — URINE MICROSCOPIC-ADD ON

## 2015-02-20 LAB — PROCALCITONIN: Procalcitonin: 5.47 ng/mL

## 2015-02-20 LAB — URINALYSIS, ROUTINE W REFLEX MICROSCOPIC
Bilirubin Urine: NEGATIVE
Glucose, UA: NEGATIVE mg/dL
Ketones, ur: NEGATIVE mg/dL
Leukocytes, UA: NEGATIVE
Nitrite: NEGATIVE
Protein, ur: 30 mg/dL — AB
Specific Gravity, Urine: 1.022 (ref 1.005–1.030)
pH: 6.5 (ref 5.0–8.0)

## 2015-02-20 LAB — MAGNESIUM: Magnesium: 1.9 mg/dL (ref 1.7–2.4)

## 2015-02-20 LAB — ALBUMIN: Albumin: 2.4 g/dL — ABNORMAL LOW (ref 3.5–5.0)

## 2015-02-20 LAB — PHOSPHORUS: Phosphorus: 3.6 mg/dL (ref 2.5–4.6)

## 2015-02-20 MED ORDER — SODIUM CHLORIDE 0.9 % IV SOLN
1500.0000 mg | Freq: Once | INTRAVENOUS | Status: AC
  Administered 2015-02-20: 1500 mg via INTRAVENOUS
  Filled 2015-02-20: qty 1500

## 2015-02-20 MED ORDER — ACETAMINOPHEN 160 MG/5ML PO SOLN
650.0000 mg | Freq: Four times a day (QID) | ORAL | Status: DC | PRN
  Administered 2015-02-20 – 2015-02-22 (×2): 650 mg
  Filled 2015-02-20 (×3): qty 20.3

## 2015-02-20 MED ORDER — ANTICOAGULANT SODIUM CITRATE 4% (200MG/5ML) IV SOLN
5.0000 mL | Freq: Once | Status: AC
  Administered 2015-02-20: 5 mL via INTRAVENOUS
  Filled 2015-02-20 (×2): qty 250

## 2015-02-20 MED ORDER — PIPERACILLIN-TAZOBACTAM 3.375 G IVPB
3.3750 g | Freq: Four times a day (QID) | INTRAVENOUS | Status: DC
  Filled 2015-02-20 (×2): qty 50

## 2015-02-20 MED ORDER — SODIUM CHLORIDE 0.9% FLUSH: 3.0000 mL | INTRAVENOUS | Status: DC | PRN

## 2015-02-20 MED ORDER — SODIUM CHLORIDE 0.9 % IV SOLN
250.0000 mL | INTRAVENOUS | Status: DC | PRN
  Administered 2015-02-21: 250 mL via INTRAVENOUS

## 2015-02-20 MED ORDER — PIPERACILLIN-TAZOBACTAM 3.375 G IVPB 30 MIN
3.3750 g | Freq: Four times a day (QID) | INTRAVENOUS | Status: DC
  Administered 2015-02-20 – 2015-02-22 (×9): 3.375 g via INTRAVENOUS
  Filled 2015-02-20 (×11): qty 50

## 2015-02-20 MED ORDER — SODIUM CHLORIDE 0.9% FLUSH
3.0000 mL | Freq: Two times a day (BID) | INTRAVENOUS | Status: DC
  Administered 2015-02-20: 3 mL via INTRAVENOUS
  Administered 2015-02-21: 10 mL via INTRAVENOUS
  Administered 2015-02-22 – 2015-03-01 (×5): 3 mL via INTRAVENOUS

## 2015-02-20 MED ORDER — VANCOMYCIN HCL 10 G IV SOLR
1250.0000 mg | INTRAVENOUS | Status: DC
  Administered 2015-02-21 – 2015-02-22 (×2): 1250 mg via INTRAVENOUS
  Filled 2015-02-20 (×2): qty 1250

## 2015-02-20 MED ORDER — SODIUM CHLORIDE 0.9 % IV BOLUS (SEPSIS)
1000.0000 mL | Freq: Once | INTRAVENOUS | Status: AC
  Administered 2015-02-20: 1000 mL via INTRAVENOUS

## 2015-02-20 NOTE — Progress Notes (Signed)
CRITICAL VALUE ALERT  Critical value received:  Lactic acid 12.3  Date of notification:  02/20/2015  Time of notification:  0420  Critical value read back:Yes.    Nurse who received alert:  Fayrene Helper  MD notified (1st page):  Dr. Oletta Darter  Time of first page:  0421  MD notified (2nd page):  Time of second page:  Responding MD:  Dr. Oletta Darter  Time MD responded:  [phone]

## 2015-02-20 NOTE — Progress Notes (Signed)
CRITICAL VALUE ALERT  Critical value received:  LA 11.1 and Troponin 1.34  Date of notification:  02/20/15  Time of notification:  [id number]  Critical value read back:Yes.    Nurse who received alert:  Allegra Grana, RN  MD notified (1st page):  Dr Minda Ditto  Time of first page:  On floor  MD notified (2nd page):  Time of second page:  Responding MD:  Dr Minda Ditto  Time MD responded:  1336

## 2015-02-20 NOTE — Progress Notes (Signed)
Echocardiogram 2D Echocardiogram has been performed.  [name] [name] 02/20/2015, 4:33 PM

## 2015-02-20 NOTE — Progress Notes (Signed)
Pt's temp 102.2. Dr. Oletta Darter made aware.

## 2015-02-20 NOTE — Progress Notes (Signed)
Pharmacy Antibiotic Note  [name] [name] is a 55 y.o. male admitted on 02/18/2015 with Suicide attempt.  Pharmacy has been consulted for Vancomycin and Zosyn dosing. WBC is elevated. Pt is currently on CRRT.   Plan: -Vancomycin 1500 mg IV x 1 load, then 1250 mg IV q24h -Zosyn 3.375g IV q6h  -F/U CRRT plans as this will affect anti-biotic dosing -Drug levels as indicated   Height: 5\' 9"  (175.3 cm) Weight: 256 lb 9.9 oz (116.4 kg) IBW/kg (Calculated) : 70.7  Temp (24hrs), Avg:99.3 F (37.4 C), Min:97.5 F (36.4 C), Max:102.2 F (39 C)   Recent Labs Lab 02/18/15 0440  02/18/15 0802  02/18/15 1534  02/18/15 2224  02/19/15 0430  02/19/15 1149 02/19/15 1524 02/19/15 1529 02/19/15 1829 02/19/15 2101 02/19/15 2355  WBC 9.5  --  9.1  --   --   --  27.5*  --  29.3*  --   --   --   --   --   --   --   CREATININE 3.48*  --  3.73*  --  4.83*  --   --   --  3.80*  --   --  3.25*  --   --   --   --   LATICACIDVEN  --   < > 14.5*  < >  --   < >  --   < > 12.3*  < > 8.7*  --  8.4* 10.1* 10.9* 12.6*  < > = values in this interval not displayed.  Estimated Creatinine Clearance: 33.1 mL/min (by C-G formula based on Cr of 3.25).    Allergies  Allergen Reactions  . Oxytetracycline Rash  . Sulfonamide Derivatives Rash    Narda Bonds 02/20/2015 4:18 AM

## 2015-02-20 NOTE — Progress Notes (Signed)
Called to talk with Poison Control. Reviewed the patient's laboratory evaluation for Volatile Alcohols and Ethylene Glycol which were negative on 2/5. Poison control has recommended discontinuation of Fomepazole. They recommend continued monitoring of electrolytes, echocardiogram once, and telemetry monitoring.   Paula Compton, MD Family Medicine - PGY 2

## 2015-02-20 NOTE — Progress Notes (Signed)
CRITICAL VALUE ALERT  Critical value received:  Lactic acid 12.6  Date of notification:  02/20/2015  Time of notification:  0100  Critical value read back:Yes.    Nurse who received alert:  Fayrene Helper  MD notified (1st page):  Dr. Gerarda Fraction  Time of first page:  0101  MD notified (2nd page):  Time of second page:  Responding MD:  Dr. Gerarda Fraction  Time MD responded:  [phone]

## 2015-02-20 NOTE — Progress Notes (Signed)
[facility] Progress Note    Assessment/ Plan:   1. Acute renal failure: Possibly hemodynamically mediated with lisinopril/hydrochlorothiazide and metformin overdose. Current hypotension also raising possibility for ischemic ATN. Will continue CRRT for management of metabolic acidosis/metformin overdose with no ultrafiltration to minimize hemodynamic impact-clinically without evidence of volume overload. Renal U/S without evidence of hydronephrosis.   2. Metformin overdose/toxicity with lactic acidosis: With severe anion gap metabolic acidosis, elevated lactic acid levels and depressed mentation on admission, on extracorporeal therapy/CRRT and sodium bicarb gtt.   3. Suicidal attempt: Status post repair of left slit wrist and will undertake hemodialysis for management of profound metabolic acidosis from metformin toxicity. Also on fomepizaole of ethylene glycol OD . Methylene blue discontinued as methemoglobin normal.  4. Hypotension: Possibly from overdose with antihypertensive medications and profound metabolic acidosis. Currently on Norepinephrine, Phenylephrine and Vasopressin IV infusion as well as Solu-cortef.  5. Hypocalcemia: corrected Ca 8.0. Received calcium gluconate x 1. Could consider another round.   Subjective:   Patient intubated. Starting to wean off pressors. Decreased neo (400 yesterday to 234mcg/min today).   Levophed slightly increased to 51mcg. Vasopressin stable (0.03 units/min).    Objective:   BP 99/54 mmHg  Pulse 98  Temp(Src) 98.7 F (37.1 C) (Oral)  Resp 14  Ht 5\' 9"  (1.753 m)  Wt 256 lb 9.9 oz (116.4 kg)  BMI 37.88 kg/m2  SpO2 100%  Intake/Output Summary (Last 24 hours) at 02/20/15 0835 Last data filed at 02/20/15 0800  Gross per 24 hour  Intake 8896.11 ml  Output   7668 ml  Net 1228.11 ml   Weight change: 2 lb 6.8 oz (1.1 kg)  Physical Exam: General: Lying in bed intubated, sedated.  Cardiovascular: RRR. No murmurs, rubs, or gallops  noted. No pitting edema noted. Respiratory: No increased WOB. CTAB over the anterior chest wall without wheezing, rhonchi, or crackles noted. Abdomen: +BS, distended, firm. No rigidity.  Skin: left wrist dressing clean/dry.     Imaging: US Renal  02/19/2015  CLINICAL DATA:  Acute renal failure, on vent EXAM: RENAL / URINARY TRACT ULTRASOUND COMPLETE COMPARISON:  None. FINDINGS: Right Kidney: Length: 12.3 cm.  No mass or hydronephrosis. Left Kidney: Length: 16.0 cm.  No mass or hydronephrosis. Bladder: Decompressed by indwelling Foley catheter. IMPRESSION: No hydronephrosis. Bladder decompressed by indwelling Foley catheter. Electronically Signed   By: Julian Hy M.D.   On: 02/19/2015 12:35   Dg Chest Port 1 View  02/20/2015  CLINICAL DATA:  Respiratory failure. EXAM: PORTABLE CHEST 1 VIEW COMPARISON:  02/19/2015. FINDINGS: Endotracheal tube and NG tube in stable position. Heart size stable. Persistent low lung volumes with progressive right base atelectasis and or infiltrate. No pleural effusion pneumothorax. IMPRESSION: 1. Lines and tubes in stable position. 2. Persistent low lung volumes with progressive right lower lobe atelectasis and or infiltrate. Electronically Signed   By: Marcello Moores  Register   On: 02/20/2015 07:04   Dg Chest Port 1 View  02/19/2015  CLINICAL DATA:  Respiratory failure. EXAM: PORTABLE CHEST 1 VIEW COMPARISON:  02/18/2015. FINDINGS: Endotracheal tube and NG tube in stable position. Cardiomegaly. Low lung volumes with bibasilar atelectasis and/or mild infiltrates. No pleural effusion or pneumothorax IMPRESSION: 1. Lines and tubes in stable position. 2. Lung volumes with mild bibasilar atelectasis and/or infiltrates. Electronically Signed   By: Marcello Moores  Register   On: 02/19/2015 07:15   Dg Chest Port 1 View  02/18/2015  CLINICAL DATA:  Attempted central line placement. Assess for pneumothorax. Initial encounter. EXAM:  PORTABLE CHEST 1 VIEW COMPARISON:  Chest radiograph  performed earlier today at 9:06 a.m. FINDINGS: The patient's endotracheal tube is seen ending 6 cm above the carina. No central line is seen. An enteric tube is noted extending below the diaphragm. No pneumothorax is seen. The lungs appear relatively clear. No focal consolidation or pleural effusion is identified. The cardiomediastinal silhouette is normal in size. No acute osseous abnormalities are identified. There is chronic resorption or resection of the distal right clavicle. IMPRESSION: 1. No evidence of pneumothorax.  No central line seen at this time. 2. Endotracheal tube seen ending 6 cm above the carina. 3. Lungs remain relatively clear. Electronically Signed   By: Garald Balding M.D.   On: 02/18/2015 21:58   Dg Chest Port 1v Same Day  02/18/2015  CLINICAL DATA:  Brought to the emergency room after found at home by his mother with a slit left wrist and empty medication bottles (lisinopril/HCTZ, gabapentin and quetiapine) and partially empty metformin bottle. Verify intubation and NG tube placement. EXAM: PORTABLE CHEST 1 VIEW COMPARISON:  Radiograph 12/31/2013 FINDINGS: Endotracheal tube 6 cm from carina. NG tube extends to the stomach. Normal cardiac silhouette. Lungs are clear. IMPRESSION: Endotracheal tube in good position.  NG tube extends the stomach. Lungs are clear. Electronically Signed   By: Suzy Bouchard M.D.   On: 02/18/2015 09:36   Dg Abd Portable 1v  02/18/2015  CLINICAL DATA:  Evaluate NG tube placement. EXAM: PORTABLE ABDOMEN - 1 VIEW COMPARISON:  Abdominal radiograph 01/27/2008 FINDINGS: Enteric tube tip and side-port project over the left upper quadrant. Nonobstructed bowel gas pattern. Unremarkable osseous skeleton. Nonspecific catheter tubing projecting over the left lower quadrant. IMPRESSION: Enteric tube tip and side-port project over the left upper quadrant, likely within the stomach. Indeterminate tubing projecting over the left lower quadrant. Recommend clinical correlation.  Electronically Signed   By: Lovey Newcomer M.D.   On: 02/18/2015 09:33    Labs: BMET  Recent Labs Lab 02/18/15 0440 02/18/15 0802 02/18/15 1534 02/19/15 0430 02/19/15 1524 02/20/15 0509  NA 138  --  143 138 137 133*  K 4.8  --  5.7* 3.1* 3.4* 3.8  CL 97*  --  103 97* 92* 91*  CO2 10*  --  <7* 12* 21* 18*  GLUCOSE 278*  --  468* 409* 142* 146*  BUN 29*  --  40* 31* 30* 34*  CREATININE 3.48* 3.73* 4.83* 3.80* 3.25* 3.49*  CALCIUM 9.4  --  7.2* 6.1* 7.0* 7.1*  PHOS  --   --  14.5* 4.2 3.1 3.6   Anion gap 24 CBC  Recent Labs Lab 02/18/15 0440 02/18/15 0802 02/18/15 2224 02/19/15 0430 02/20/15 0509  WBC 9.5 9.1 27.5* 29.3* 10.4  NEUTROABS 7.6  --   --   --   --   HGB 14.2 12.6* 11.7* 12.7* 9.7*  HCT 41.6 38.2* 34.8* 36.4* 29.2*  MCV 81.6 84.5 83.5 80.2 79.3  PLT 182 159 218 268 120*    Medications:    . antiseptic oral rinse  7 mL Mouth Rinse QID  . chlorhexidine gluconate  15 mL Mouth Rinse BID  . feeding supplement (PRO-STAT SUGAR FREE 64)  60 mL Per Tube TID  . feeding supplement (VITAL HIGH PROTEIN)  1,000 mL Per Tube Q24H  . heparin  5,000 Units Subcutaneous 3 times per day  . hydrocortisone sod succinate (SOLU-CORTEF) inj  50 mg Intravenous Q6H  . pantoprazole (PROTONIX) IV  40 mg Intravenous Daily  .  piperacillin-tazobactam  3.375 g Intravenous 4 times per day  . [START ON 02/21/2015] vancomycin  1,250 mg Intravenous Q24H      Kathrine Cords, MD [place] Resident, PGY-2 02/20/2015, 8:35 AM  I have seen and examined this patient and agree with plan per Dr Lorenso Courier.  Metabolically stable. Discussed his renal failure with family.  Cont IV bicarb gtt and CVVHD as long as BP low.  UO remains minimal.. Aritza Brunet T,MD 02/20/2015 9:05 AM

## 2015-02-20 NOTE — Progress Notes (Signed)
eLink Physician-Brief Progress Note Patient Name: [name] [name] DOB: November 10, 1960 MRN: [id number]   Date of Service  02/20/2015  HPI/Events of Note  Fever - Temp = 102.2 F. Patient is not on any antibiotic Rx. Last Bilirubin, ALT and AST all normal.   eICU Interventions  Will order: 1. Blood Cultures X 2. 2. Tracheal aspirate culture. 3. UA. 4. Vancomycin and Zosyn per pharmacy consult. 5. Tylenol liquid 650 mg via OGT Q 6 hours PRN Temp > 101.5 F.     Intervention Category Intermediate Interventions: Infection - evaluation and management  Maridee Slape [place] 02/20/2015, 4:17 AM

## 2015-02-20 NOTE — Progress Notes (Signed)
[place] Progress Note Patient Name: [name] [name] DOB: 1960-04-26 MRN: [id number]   Date of Service  02/20/2015  HPI/Events of Note  CT abd-->does NOT reveal any acute process, RLL lung base shows opacity-effusion with rounded atalectasis vs mass  eICU Interventions  Dedicated chest CT would be helpful if clinically indicated, primary team to decide on CT chest        Jaslyne Beeck 02/20/2015, 4:10 PM

## 2015-02-20 NOTE — Care Management Note (Signed)
Case Management Note  Patient Details  Name: [name] [name] MRN: [id number] Date of Birth: 08/30/1960  Subjective/Objective:    Pt admitted post suicide attempt, pt recently discharge from substance abuse facility (approximately 1 week)                Action/Plan:  Pt is from home, recent discharge from substance abuse facility.  Pt is now on ventilator and undergoing CRRT.  CM consulted CSW to assist in discharge plan.  CM will continue to monitor for disposition needs.   Expected Discharge Date:                  [facility]  In-House Referral:  Clinical Social Work  Discharge planning Services  CM Consult  Post Acute Care Choice:    Choice offered to:     DME Arranged:    DME Agency:     HH Arranged:    HH Agency:     Status of Service:  In process, will continue to follow  Medicare Important Message Given:    Date Medicare IM Given:    Medicare IM give by:    Date Additional Medicare IM Given:    Additional Medicare Important Message give by:     If discussed at Long Length of Stay Meetings, dates discussed:    Additional Comments: 02/20/2015 CM met with family and spoke in depth of discharge planning.  Pts son [name] [name] and brother [name] [name] will serve as support system for pt.  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  Psych  CSW consulted; will begin to follow pt post extubation.    02/19/15  No family at bedside ,  S, RN 02/20/2015, 2:20 PM  

## 2015-02-20 NOTE — Progress Notes (Signed)
CRITICAL VALUE ALERT  Critical value received:  LA 11.4  Date of notification:  02/20/15  Time of notification:  [id number]  Critical value read back:Yes.    Nurse who received alert:  Allegra Grana, RN  MD notified (1st page):  Dr Vaughan Browner  Time of first page:  59  MD notified (2nd page):  Time of second page:  Responding MD:  Dr Vaughan Browner  Time MD responded:  1020

## 2015-02-20 NOTE — Progress Notes (Signed)
PULMONARY / CRITICAL CARE MEDICINE HISTORY AND PHYSICAL EXAMINATION   Name: [name] [name] MRN: [id number] DOB: 1960/07/22    ADMISSION DATE:  02/18/2015  PRIMARY SERVICE: PCCM  CHIEF COMPLAINT:  Suicide attempt  BRIEF PATIENT DESCRIPTION: 55 y/o man with bipolar d/o who attempted suicide with polysubstance overdose and slit wrist  SIGNIFICANT EVENTS / STUDIES:  LA >15 ARF L wrist stitched up in ED. 2/5- Started on CVVH. 2/7 CXR > persistent LLL atalectasis vs. Infiltrate.   LINES / TUBES: PIV x2  CULTURES: 2/6 Blood Cx x2 > pending 2/7 Resp Culture > pending  ANTIBIOTICS: Vancomycin 2/7 > Zosyn 2/7 >  HISTORY OF PRESENT ILLNESS:   [name] [name] is a 55 y/o man with bipolar d/o and prior history of suicide ideations, but no apparent prior attempts who was found at home by his mother with slit wrist and empty pill bottles (Linsinopril, gabapentin, and quetiapine). He also probably took some metformin, but it is not clear how much he took. He has had worsening depression and then tonight before bed the patient mentioned to his mother that if he "ever needed to go life support" that he wouldn't want it. During the night she found him lying in the floor of his room with blood from a slit wrist and called EMS. He was also altered, but able to speak (he asked her to not call EMS). EMS found empty pill bottles although the metformin bottle was not empty, it is not known if or how much of that medication he took. In the ED he was found to be severely acidotic with profound metabolic acidosis.  PAST MEDICAL HISTORY :  Past Medical History  Diagnosis Date  . Depressed bipolar disorder ([place])   . Hypertension   . Diabetes mellitus without complication ([place])   . Sleep apnea   . Diabetic neuropathy [facility])    Past Surgical History  Procedure Laterality Date  . Spine surgery  2010  . Shoulder arthroscopy with rotator cuff repair Right 2009   Prior to Admission medications    Medication Sig Start Date End Date Taking? Authorizing Provider  buPROPion (WELLBUTRIN SR) 150 MG 12 hr tablet Take 150 mg by mouth 2 (two) times daily.    Historical Provider, MD  gabapentin ([name]) 600 MG tablet TAKE 1 TABLET MOUTH 3 TIMES DAY Patient taking differently: Take 600 mg by mouth as needed for diabetic neuropathy. 10/03/14   Lance Bosch, NP  lisinopril-hydrochlorothiazide (PRINZIDE,ZESTORETIC) 20-12.5 MG tablet Take 1 tablet by mouth daily. 10/17/14   Lance Bosch, NP  metFORMIN (GLUCOPHAGE) 500 MG tablet Take 1 tablet (500 mg total) by mouth 2 (two) times daily with a meal. 10/17/14   Lance Bosch, NP  methadone (DOLOPHINE) 10 MG/ML solution Take 70 mg by mouth daily.     Historical Provider, MD  neomycin-bacitracin-polymyxin (NEOSPORIN) ointment Apply 1 application topically every 12 (twelve) hours. apply to eye    Historical Provider, MD  omeprazole (PRILOSEC) 20 MG capsule Take 1 capsule (20 mg total) by mouth 2 (two) times daily before a meal. Patient taking differently: Take 20 mg by mouth daily as needed (indigestion/acid reflux).  01/01/14   Barton Dubois, MD  QUEtiapine (SEROQUEL XR) 300 MG 24 hr tablet Take 300 mg by mouth at bedtime.    Historical Provider, MD   Allergies  Allergen Reactions  . Oxytetracycline Rash  . Sulfonamide Derivatives Rash    FAMILY HISTORY:  Family History  Problem Relation Age of Onset  .  Diabetes Mother   . Hyperlipidemia Mother   . Heart disease Father    SOCIAL HISTORY:  reports that he has never smoked. He does not have any smokeless tobacco history on file. He reports that he does not drink alcohol or use illicit drugs.  REVIEW OF SYSTEMS:  Unable to obtain 2/2 AMS.  SUBJECTIVE:  Worsening acidosis overnight, continues on multiple pressors (Levo, vaso, Neo). Stable vent support. Got CRRT yesterday.   VITAL SIGNS: Temp:  [97.5 F (36.4 C)-102.2 F (39 C)] 99.8 F (37.7 C) (02/07 0525) Pulse Rate:  [76-104] 97  (02/07 0700) Resp:  [18-26] 23 (02/07 0700) BP: (91-120)/(46-77) 100/62 mmHg (02/07 0700) SpO2:  [95 %-100 %] 100 % (02/07 0700) Arterial Line BP: (73-128)/(37-64) 104/53 mmHg (02/07 0700) FiO2 (%):  [40 %] 40 % (02/07 0400) Weight:  [256 lb 9.9 oz (116.4 kg)] 256 lb 9.9 oz (116.4 kg) (02/07 0405) HEMODYNAMICS:   VENTILATOR SETTINGS: Vent Mode:  [-] PCV FiO2 (%):  [40 %] 40 % Set Rate:  [25 bmp] 25 bmp PEEP:  [5 cmH20] 5 cmH20 Plateau Pressure:  [16 cmH20-22 cmH20] 16 cmH20 INTAKE / OUTPUT: Intake/Output      02/06 0701 - 02/07 0700 02/07 0701 - 02/08 0700   I.V. (mL/kg) 6779.1 (58.2)    Other 988    NG/GT 556.7    IV Piggyback 752    Total Intake(mL/kg) 9075.7 (78)    Urine (mL/kg/hr) 65 (0)    Other 7654 (2.7)    Total Output 7719     Net +1356.7            PHYSICAL EXAMINATION: General:  Sedated, unresponsive. No distress Neuro:  PERRL, no gross focal deficits HEENT:  Supple, no obvious JVD.  Cardiovascular:  RRR. No MRG Lungs:  Clear, No crackles, wheezing Abdomen:  Soft, + BS Musculoskeletal:  No swollen joints Skin:  No rashes  LABS:  CBC  Recent Labs Lab 02/18/15 2224 02/19/15 0430 02/20/15 0509  WBC 27.5* 29.3* 10.4  HGB 11.7* 12.7* 9.7*  HCT 34.8* 36.4* 29.2*  PLT 218 268 120*   Coag's  Recent Labs Lab 02/18/15 2224 02/19/15 0430 02/19/15 0733 02/19/15 1230  APTT  --  >200* >200* 97*  INR 1.98* 1.86*  --   --    BMET  Recent Labs Lab 02/19/15 0430 02/19/15 1524 02/20/15 0509  NA 138 137 133*  K 3.1* 3.4* 3.8  CL 97* 92* 91*  CO2 12* 21* 18*  BUN 31* 30* 34*  CREATININE 3.80* 3.25* 3.49*  GLUCOSE 409* 142* 146*   Electrolytes  Recent Labs Lab 02/19/15 0430 02/19/15 1524 02/20/15 0509  CALCIUM 6.1* 7.0* 7.1*  MG 1.8  --  1.9  PHOS 4.2 3.1 3.6   Sepsis Markers  Recent Labs Lab 02/19/15 2355 02/20/15 0320 02/20/15 0616  LATICACIDVEN 12.6* 12.3* 11.9*   ABG  Recent Labs Lab 02/18/15 2301 02/19/15 0310  02/20/15 0235  PHART 7.110* 7.267* 7.444  PCO2ART 19.1* 20.2* 25.9*  PO2ART 204.0* 130* 114*   Liver Enzymes  Recent Labs Lab 02/18/15 0440  02/18/15 2224 02/19/15 0430 02/19/15 1524 02/20/15 0509  AST 31  --  34 48*  --   --   ALT 32  --  23 <5*  --   --   ALKPHOS 46  --  35* 43  --   --   BILITOT 0.9  --  1.0 0.9  --   --   ALBUMIN 3.9  < >  2.4* 2.8* 2.5* 2.4*  < > = values in this interval not displayed. Cardiac Enzymes No results for input(s): TROPONINI, PROBNP in the last 168 hours. Glucose  Recent Labs Lab 02/20/15 0152 02/20/15 0259 02/20/15 0357 02/20/15 0457 02/20/15 0555 02/20/15 0653  GLUCAP 157* 164* 153* 152* 163* 114*    Imaging US Renal  02/19/2015  CLINICAL DATA:  Acute renal failure, on vent EXAM: RENAL / URINARY TRACT ULTRASOUND COMPLETE COMPARISON:  None. FINDINGS: Right Kidney: Length: 12.3 cm.  No mass or hydronephrosis. Left Kidney: Length: 16.0 cm.  No mass or hydronephrosis. Bladder: Decompressed by indwelling Foley catheter. IMPRESSION: No hydronephrosis. Bladder decompressed by indwelling Foley catheter. Electronically Signed   By: Julian Hy M.D.   On: 02/19/2015 12:35   Dg Chest Port 1 View  02/20/2015  CLINICAL DATA:  Respiratory failure. EXAM: PORTABLE CHEST 1 VIEW COMPARISON:  02/19/2015. FINDINGS: Endotracheal tube and NG tube in stable position. Heart size stable. Persistent low lung volumes with progressive right base atelectasis and or infiltrate. No pleural effusion pneumothorax. IMPRESSION: 1. Lines and tubes in stable position. 2. Persistent low lung volumes with progressive right lower lobe atelectasis and or infiltrate. Electronically Signed   By: Marcello Moores  Register   On: 02/20/2015 07:04   Dg Chest Port 1 View  02/19/2015  CLINICAL DATA:  Respiratory failure. EXAM: PORTABLE CHEST 1 VIEW COMPARISON:  02/18/2015. FINDINGS: Endotracheal tube and NG tube in stable position. Cardiomegaly. Low lung volumes with bibasilar atelectasis  and/or mild infiltrates. No pleural effusion or pneumothorax IMPRESSION: 1. Lines and tubes in stable position. 2. Lung volumes with mild bibasilar atelectasis and/or infiltrates. Electronically Signed   By: Marcello Moores  Register   On: 02/19/2015 07:15   Dg Chest Port 1 View  02/18/2015  CLINICAL DATA:  Attempted central line placement. Assess for pneumothorax. Initial encounter. EXAM: PORTABLE CHEST 1 VIEW COMPARISON:  Chest radiograph performed earlier today at 9:06 a.m. FINDINGS: The patient's endotracheal tube is seen ending 6 cm above the carina. No central line is seen. An enteric tube is noted extending below the diaphragm. No pneumothorax is seen. The lungs appear relatively clear. No focal consolidation or pleural effusion is identified. The cardiomediastinal silhouette is normal in size. No acute osseous abnormalities are identified. There is chronic resorption or resection of the distal right clavicle. IMPRESSION: 1. No evidence of pneumothorax.  No central line seen at this time. 2. Endotracheal tube seen ending 6 cm above the carina. 3. Lungs remain relatively clear. Electronically Signed   By: Garald Balding M.D.   On: 02/18/2015 21:58   Dg Chest Port 1v Same Day  02/18/2015  CLINICAL DATA:  Brought to the emergency room after found at home by his mother with a slit left wrist and empty medication bottles (lisinopril/HCTZ, gabapentin and quetiapine) and partially empty metformin bottle. Verify intubation and NG tube placement. EXAM: PORTABLE CHEST 1 VIEW COMPARISON:  Radiograph 12/31/2013 FINDINGS: Endotracheal tube 6 cm from carina. NG tube extends to the stomach. Normal cardiac silhouette. Lungs are clear. IMPRESSION: Endotracheal tube in good position.  NG tube extends the stomach. Lungs are clear. Electronically Signed   By: Suzy Bouchard M.D.   On: 02/18/2015 09:36   Dg Abd Portable 1v  02/18/2015  CLINICAL DATA:  Evaluate NG tube placement. EXAM: PORTABLE ABDOMEN - 1 VIEW COMPARISON:   Abdominal radiograph 01/27/2008 FINDINGS: Enteric tube tip and side-port project over the left upper quadrant. Nonobstructed bowel gas pattern. Unremarkable osseous skeleton. Nonspecific catheter tubing projecting over  the left lower quadrant. IMPRESSION: Enteric tube tip and side-port project over the left upper quadrant, likely within the stomach. Indeterminate tubing projecting over the left lower quadrant. Recommend clinical correlation. Electronically Signed   By: Lovey Newcomer M.D.   On: 02/18/2015 09:33    EKG: NSR, Mild QTc prolongation CXR 2/6: No infiltrates.  ASSESSMENT / PLAN:  Active Problems:   Overdose   Acute respiratory failure with hypoxia (HCC)   Arterial hypotension   AKI (acute kidney injury) ([place])   Acute renal failure (ARF) (HCC)   Lactic acidosis   Shock circulatory (HCC)   Metabolic acidosis   PULMONARY A: Respiratory Alkalosis compensating for severe metabolic acidosis Intubated for resp protection P:   Continue full vent support. No plan for weaning at this time with worsening acidosis.   CARDIOVASCULAR A: Hypotension- Likely secondary to drug OD, acidosis. Lisinopril o/d P:   On 3 pressors Weaning down levo as tolerated Continue Stress dose steroids Echo per poison control request.   RENAL A: Acute renal failure Severe lactic acidosis P:   Continue CVVH   GASTROINTESTINAL A: No acute issues P:   Stress ulcer ppx  HEMATOLOGIC A: Slit wrist Elevated PTT P:   Elevated PTT. Taken off heparin drip peripherally.  Continue to monitor coags. Platelets dropping by half - consider HITT panel. Hgb dropping.    INFECTIOUS A: Febrile with Increasing Lactic Acid  Leukocytosis - resolving P:   Blood cx x 2 drawn overnight Respiratory cx CXR with RLL infiltrate Started on Vanc / Zosyn 2/7 > PCT algorithm  ENDOCRINE A: No active issues P:   Monitor CBG  Psych/NEUROLOGIC A: Suicide attempt AMS Polysubstance o/d P:   -  Seroquel, gabapentin should clear with supportive care. - On CVVH. - Continue fomiprazole for empiric treatment of alcohol ingestions. Follow levels (pending), dose adjustment per poison control to BID.  - Got 1 dose methylene blue yesterday. Will hold off additional doeses and check meth hemoglobin levels. - Psych consult when able.    BEST PRACTICE / DISPOSITION Level of Care:  ICU Primary Service:  PCCM Consultants:  Nephro Code Status:  Full Diet:  NPO DVT Px:  Heparin GI Px:  None Skin Integrity:  Intact Social / Family:  Updated  Note: Code status discussions noted from yesterday. Although the pt stated that he did not want to be on life support. But this was expressed when he was depressed. We will continue to heep him full code for now. Family including parents updated at bedside 2/7   TODAY'S SUMMARY: 55 y/o man with bipolar d/o presenting with polysubstance o/d and severe lactic acidosis concerning for metformin overdose.  Paula Compton, MD Family Medicine - PGY 2

## 2015-02-20 NOTE — Progress Notes (Signed)
CRITICAL VALUE ALERT  Critical value received:  LA 10.3  Date of notification:  02/20/15  Time of notification:  [id number]  Critical value read back:Yes.    Nurse who received alert:  Allegra Grana, RN  MD notified (1st page):  Dr Minda Ditto  Time of first page:  1714  MD notified (2nd page):  Time of second page:  Responding MD:  Dr Minda Ditto  Time MD responded:  [phone]

## 2015-02-20 NOTE — Progress Notes (Signed)
Called lab to confirm results of the labs that were sent out to [facility] in [place]. Lab confirmed that the labs were not sent. This message was relayed to Dr Vaughan Browner and CCM resident [facility].

## 2015-02-21 ENCOUNTER — Inpatient Hospital Stay (HOSPITAL_COMMUNITY): Payer: Self-pay

## 2015-02-21 DIAGNOSIS — F3130 Bipolar disorder, current episode depressed, mild or moderate severity, unspecified: Secondary | ICD-10-CM | POA: Diagnosis present

## 2015-02-21 DIAGNOSIS — R45851 Suicidal ideations: Secondary | ICD-10-CM

## 2015-02-21 LAB — CBC
HCT: 29.2 % — ABNORMAL LOW (ref 39.0–52.0)
HCT: 25.3 % — ABNORMAL LOW (ref 39.0–52.0)
Hemoglobin: 9.7 g/dL — ABNORMAL LOW (ref 13.0–17.0)
Hemoglobin: 8.7 g/dL — ABNORMAL LOW (ref 13.0–17.0)
MCH: 26.4 pg (ref 26.0–34.0)
MCH: 27.1 pg (ref 26.0–34.0)
MCHC: 33.2 g/dL (ref 30.0–36.0)
MCHC: 34.4 g/dL (ref 30.0–36.0)
MCV: 79.3 fL (ref 78.0–100.0)
MCV: 78.8 fL (ref 78.0–100.0)
Platelets: 120 10*3/uL — ABNORMAL LOW (ref 150–400)
Platelets: 78 10*3/uL — ABNORMAL LOW (ref 150–400)
RBC: 3.68 MIL/uL — ABNORMAL LOW (ref 4.22–5.81)
RBC: 3.21 MIL/uL — ABNORMAL LOW (ref 4.22–5.81)
RDW: 14.4 % (ref 11.5–15.5)
RDW: 14 % (ref 11.5–15.5)
WBC: 10.4 10*3/uL (ref 4.0–10.5)
WBC: 5.4 10*3/uL (ref 4.0–10.5)

## 2015-02-21 LAB — BLOOD GAS, ARTERIAL
Acid-base deficit: 16.9 mmol/L — ABNORMAL HIGH (ref 0.0–2.0)
Bicarbonate: 9 mEq/L — ABNORMAL LOW (ref 20.0–24.0)
Drawn by: 437071
FIO2: 0.4
O2 Saturation: 98.7 %
PEEP: 5 cmH2O
Patient temperature: 97.2
Pressure control: 20 cmH2O
RATE: 25 resp/min
TCO2: 9.7 mmol/L (ref 0–100)
pCO2 arterial: 20.2 mmHg — ABNORMAL LOW (ref 35.0–45.0)
pH, Arterial: 7.267 — ABNORMAL LOW (ref 7.350–7.450)
pO2, Arterial: 130 mmHg — ABNORMAL HIGH (ref 80.0–100.0)

## 2015-02-21 LAB — RENAL FUNCTION PANEL
Albumin: 2.2 g/dL — ABNORMAL LOW (ref 3.5–5.0)
Albumin: 2.1 g/dL — ABNORMAL LOW (ref 3.5–5.0)
Anion gap: 21 — ABNORMAL HIGH (ref 5–15)
Anion gap: 15 (ref 5–15)
BUN: 46 mg/dL — ABNORMAL HIGH (ref 6–20)
BUN: 54 mg/dL — ABNORMAL HIGH (ref 6–20)
CO2: 23 mmol/L (ref 22–32)
CO2: 31 mmol/L (ref 22–32)
Calcium: 6.9 mg/dL — ABNORMAL LOW (ref 8.9–10.3)
Calcium: 7.1 mg/dL — ABNORMAL LOW (ref 8.9–10.3)
Chloride: 89 mmol/L — ABNORMAL LOW (ref 101–111)
Chloride: 90 mmol/L — ABNORMAL LOW (ref 101–111)
Creatinine, Ser: 3.65 mg/dL — ABNORMAL HIGH (ref 0.61–1.24)
Creatinine, Ser: 3.52 mg/dL — ABNORMAL HIGH (ref 0.61–1.24)
GFR calc Af Amer: 20 mL/min — ABNORMAL LOW (ref >60)
GFR calc Af Amer: 21 mL/min — ABNORMAL LOW (ref >60)
GFR calc non Af Amer: 18 mL/min — ABNORMAL LOW (ref >60)
GFR calc non Af Amer: 18 mL/min — ABNORMAL LOW (ref >60)
Glucose, Bld: 173 mg/dL — ABNORMAL HIGH (ref 65–99)
Glucose, Bld: 139 mg/dL — ABNORMAL HIGH (ref 65–99)
Phosphorus: 3.6 mg/dL (ref 2.5–4.6)
Phosphorus: 4.5 mg/dL (ref 2.5–4.6)
Potassium: 4.1 mmol/L (ref 3.5–5.1)
Potassium: 4.1 mmol/L (ref 3.5–5.1)
Sodium: 133 mmol/L — ABNORMAL LOW (ref 135–145)
Sodium: 136 mmol/L (ref 135–145)

## 2015-02-21 LAB — HEPATIC FUNCTION PANEL
ALT: 33 U/L (ref 17–63)
AST: 35 U/L (ref 15–41)
Albumin: 2.1 g/dL — ABNORMAL LOW (ref 3.5–5.0)
Alkaline Phosphatase: 39 U/L (ref 38–126)
Bilirubin, Direct: 0.2 mg/dL (ref 0.1–0.5)
Indirect Bilirubin: 0.6 mg/dL (ref 0.3–0.9)
Total Bilirubin: 0.8 mg/dL (ref 0.3–1.2)
Total Protein: 4.7 g/dL — ABNORMAL LOW (ref 6.5–8.1)

## 2015-02-21 LAB — POCT I-STAT 3, ART BLOOD GAS (G3+)
Acid-Base Excess: 4 mmol/L — ABNORMAL HIGH (ref 0.0–2.0)
Bicarbonate: 28 mEq/L — ABNORMAL HIGH (ref 20.0–24.0)
O2 Saturation: 99 %
Patient temperature: 99.3
TCO2: 29 mmol/L (ref 0–100)
pCO2 arterial: 37.5 mmHg (ref 35.0–45.0)
pH, Arterial: 7.483 — ABNORMAL HIGH (ref 7.350–7.450)
pO2, Arterial: 141 mmHg — ABNORMAL HIGH (ref 80.0–100.0)

## 2015-02-21 LAB — GLUCOSE, CAPILLARY
Glucose-Capillary: 180 mg/dL — ABNORMAL HIGH (ref 65–99)
Glucose-Capillary: 182 mg/dL — ABNORMAL HIGH (ref 65–99)
Glucose-Capillary: 190 mg/dL — ABNORMAL HIGH (ref 65–99)
Glucose-Capillary: 133 mg/dL — ABNORMAL HIGH (ref 65–99)
Glucose-Capillary: 156 mg/dL — ABNORMAL HIGH (ref 65–99)
Glucose-Capillary: 161 mg/dL — ABNORMAL HIGH (ref 65–99)
Glucose-Capillary: 169 mg/dL — ABNORMAL HIGH (ref 65–99)
Glucose-Capillary: 174 mg/dL — ABNORMAL HIGH (ref 65–99)
Glucose-Capillary: 148 mg/dL — ABNORMAL HIGH (ref 65–99)
Glucose-Capillary: 150 mg/dL — ABNORMAL HIGH (ref 65–99)
Glucose-Capillary: 165 mg/dL — ABNORMAL HIGH (ref 65–99)
Glucose-Capillary: 171 mg/dL — ABNORMAL HIGH (ref 65–99)
Glucose-Capillary: 127 mg/dL — ABNORMAL HIGH (ref 65–99)
Glucose-Capillary: 143 mg/dL — ABNORMAL HIGH (ref 65–99)
Glucose-Capillary: 146 mg/dL — ABNORMAL HIGH (ref 65–99)
Glucose-Capillary: 149 mg/dL — ABNORMAL HIGH (ref 65–99)
Glucose-Capillary: 151 mg/dL — ABNORMAL HIGH (ref 65–99)
Glucose-Capillary: 135 mg/dL — ABNORMAL HIGH (ref 65–99)
Glucose-Capillary: 147 mg/dL — ABNORMAL HIGH (ref 65–99)
Glucose-Capillary: 160 mg/dL — ABNORMAL HIGH (ref 65–99)

## 2015-02-21 LAB — HEPARIN INDUCED PLATELET AB (HIT ANTIBODY): Heparin Induced Plt Ab: 0.216 OD (ref 0.000–0.400)

## 2015-02-21 LAB — LACTIC ACID, PLASMA
Lactic Acid, Venous: 1.9 mmol/L (ref 0.5–2.0)
Lactic Acid, Venous: 10.1 mmol/L (ref 0.5–2.0)
Lactic Acid, Venous: 2.5 mmol/L (ref 0.5–2.0)
Lactic Acid, Venous: 4.3 mmol/L (ref 0.5–2.0)
Lactic Acid, Venous: 6.9 mmol/L (ref 0.5–2.0)
Lactic Acid, Venous: 8.5 mmol/L (ref 0.5–2.0)
Lactic Acid, Venous: 9.9 mmol/L (ref 0.5–2.0)

## 2015-02-21 LAB — MAGNESIUM: Magnesium: 2.3 mg/dL (ref 1.7–2.4)

## 2015-02-21 LAB — PROCALCITONIN: Procalcitonin: 3.97 ng/mL

## 2015-02-21 MED ORDER — DEXTROSE 5 % IV SOLN
Status: DC
  Filled 2015-02-21 (×2): qty 1500

## 2015-02-21 MED ORDER — HEPARIN SODIUM (PORCINE) 1000 UNIT/ML DIALYSIS: 1000.0000 [IU] | INTRAMUSCULAR | Status: DC | PRN

## 2015-02-21 MED ORDER — DEXTROSE 5 % IV SOLN
20.0000 g | INTRAVENOUS | Status: DC
  Filled 2015-02-21: qty 200

## 2015-02-21 NOTE — Clinical Social Work Psych Note (Signed)
CSW received consult for patient due to intentional overdose.  Patient is now extubated. Psych CSW placed official psych consult.  CSW now following.  Nonnie Done, LCSW [phone]  5N1-9; 2S 15-16 and [place] Licensed Clinical Social Worker

## 2015-02-21 NOTE — Progress Notes (Signed)
eLink Physician-Brief Progress Note Patient Name: RENA MARSOLEK DOB: 1960-07-25 MRN: [id number]   Date of Service  02/21/2015  HPI/Events of Note  Camera check on patient post extubation. Comfortable laying in bed. No respiratory distress.  eICU Interventions  Continue current plan of care.     Intervention Category Intermediate Interventions: Other:  Tera Partridge 02/21/2015, 9:55 PM

## 2015-02-21 NOTE — Procedures (Signed)
Extubation Procedure Note  Patient Details:   Name: [name] [name] DOB: [birth year] MRN: [id number]   Airway Documentation:     Evaluation  O2 sats: stable throughout Complications: No apparent complications Patient did tolerate procedure well. Bilateral Breath Sounds: Diminished Suctioning: Airway Yes   Patient extubated to 2L nasal cannula per MD order.  Positive cuff leak noted.  No evidence of stridor.  Patient able to speak post extubation.  Sats currently 100%.  Vitals are stable.  Incentive spirometry performed x5 with achieved goal of 2000.  No apparent complications.   Philomena Doheny 02/21/2015, 10:26 AM

## 2015-02-21 NOTE — Progress Notes (Signed)
Poison Control called for an update on pt. Poison Control stated they would just be following daily now. No new recommendations.

## 2015-02-21 NOTE — Progress Notes (Signed)
[facility] Progress Note    Assessment/ Plan:   1. Acute renal failure: Possibly hemodynamically mediated with lisinopril/hydrochlorothiazide and metformin overdose. Current hypotension also raising possibility for ischemic ATN. Will continue CVVH for management of metabolic acidosis/metformin overdose with no ultrafiltration to minimize hemodynamic impact-clinically without evidence of volume overload. Renal U/S without evidence of hydronephrosis. Sill oliguric with only 50cc out in the last 24hrs. 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 Filters are not lasting that long (~10-12 hours), could consider calcium citrate to help prevent clotting.  If patient able to get off pressors with stable BPs, could consider transitioning to intermittent HD in the next few days.   2. Metformin overdose/toxicity with lactic acidosis: With severe anion gap metabolic acidosis, elevated lactic acid levels and depressed mentation on admission, on extracorporeal therapy/CRRT and sodium bicarb which seems to be improving.  3. Suicidal attempt: Status post repair of left slit wrist and will undertake hemodialysis for management of profound metabolic acidosis from metformin toxicity. Off fomepizole. 4. Hypotension: Possibly from overdose with antihypertensive medications and profound metabolic acidosis, however is improving. Still on levophed and as well as Solu-cortef.  5. Hypocalcemia: corrected Ca 8.1. Received calcium gluconate x 1. Continue to monitor.  6. Concerns for pneumonia: patient spiked fevers with atelectasis vs infiltrate on CXR, on vanc and Zosyn.  Concern for masslike density within the right lower lobe, f/u per primary team.   Subjective:   . Continuing to wean off pressors. Off neo and vasopression, on Levophed at 9mcg/min    Objective:   BP 129/66 mmHg  Pulse 95  Temp(Src) 99.3 F (37.4 C) (Oral)  Resp 16  Ht 5\' 9"   (1.753 m)  Wt 255 lb 15.3 oz (116.1 kg)  BMI 37.78 kg/m2  SpO2 100%  Intake/Output Summary (Last 24 hours) at 02/21/15 0853 Last data filed at 02/21/15 0800  Gross per 24 hour  Intake 5717.22 ml  Output   4862 ml  Net 855.22 ml   Weight change: -10.6 oz (-0.3 kg)  Physical Exam: General: Lying in bed intubated, sedated.  Cardiovascular: RRR. No murmurs, rubs, or gallops noted. Trace pitting edema noted. Respiratory: No increased WOB. CTAB over the anterior chest wall without wheezing, rhonchi, or crackles noted. Abdomen: No bowel sounds heard, distended, firm. No rigidity.  Skin: left wrist dressing clean/dry.    Imaging: Ct Abdomen Pelvis Wo Contrast  02/20/2015  CLINICAL DATA:  Distended abdomen pain EXAM: CT ABDOMEN AND PELVIS WITHOUT CONTRAST TECHNIQUE: Multidetector CT imaging of the abdomen and pelvis was performed following the standard protocol without IV contrast. COMPARISON:  None. FINDINGS: Lung bases demonstrate right lower lobe consolidation and small effusion. On image number 1 of series 201, there is suggestion of a focal mass lesion. This may represent rounded pneumonia. Left basilar atelectasis is noted as well. The liver, gallbladder, spleen, adrenal glands and pancreas are within normal limits. A nasogastric catheter is noted within the stomach. The kidneys demonstrate no renal calculi or obstructive changes. The appendix is not well visualized although no inflammatory changes to suggest appendicitis are seen. The bladder is decompressed by Foley catheter. No pelvic mass lesion is noted. Arterial and venous catheters are noted in the left inguinal region. No acute bony abnormality is noted. IMPRESSION: No explanation for the patient's distended abdomen is noted. Changes in the bases bilaterally. There is some suggestion of a masslike density within the right lower lobe. Follow-up CT of the chest when the  patient's condition improves is recommended. Imaging at this point  would not be helpful given the consolidation and effusion noted. Electronically Signed   By: Inez Catalina M.D.   On: 02/20/2015 15:43   US Renal  02/19/2015  CLINICAL DATA:  Acute renal failure, on vent EXAM: RENAL / URINARY TRACT ULTRASOUND COMPLETE COMPARISON:  None. FINDINGS: Right Kidney: Length: 12.3 cm.  No mass or hydronephrosis. Left Kidney: Length: 16.0 cm.  No mass or hydronephrosis. Bladder: Decompressed by indwelling Foley catheter. IMPRESSION: No hydronephrosis. Bladder decompressed by indwelling Foley catheter. Electronically Signed   By: Julian Hy M.D.   On: 02/19/2015 12:35   Dg Chest Port 1 View  02/21/2015  CLINICAL DATA:  Acute respiratory failure, intubated patient, overdose, acute renal failure. EXAM: PORTABLE CHEST 1 VIEW COMPARISON:  Portable chest x-ray of February 20, 2015 FINDINGS: The lungs are better inflated today. Subsegmental atelectasis in the right lower lung persists. The left lung is clear. The heart and pulmonary vascularity are normal. The endotracheal tube tip lies 5.5 cm above the carina. The esophagogastric tube tip projects below the inferior margin of the image. The bony structures exhibit no acute abnormalities. IMPRESSION: Persistent subsegmental atelectasis at the right lung base. Overall improved aeration of the lungs. The support tubes are in reasonable position. Electronically Signed   By: David  Martinique M.D.   On: 02/21/2015 07:09   Dg Chest Port 1 View  02/20/2015  CLINICAL DATA:  Respiratory failure. EXAM: PORTABLE CHEST 1 VIEW COMPARISON:  02/19/2015. FINDINGS: Endotracheal tube and NG tube in stable position. Heart size stable. Persistent low lung volumes with progressive right base atelectasis and or infiltrate. No pleural effusion pneumothorax. IMPRESSION: 1. Lines and tubes in stable position. 2. Persistent low lung volumes with progressive right lower lobe atelectasis and or infiltrate. Electronically Signed   By: Marcello Moores  Register   On: 02/20/2015  07:04    Labs: BMET  Recent Labs Lab 02/18/15 [id number] 02/18/15 0802 02/18/15 1534 02/19/15 0430 02/19/15 1524 02/20/15 0509 02/20/15 1712 02/21/15 0415  NA 138  --  143 138 137 133* 132* 133*  K 4.8  --  5.7* 3.1* 3.4* 3.8 4.1 4.1  CL 97*  --  103 97* 92* 91* 89* 89*  CO2 10*  --  <7* 12* 21* 18* 21* 23  GLUCOSE 278*  --  468* 409* 142* 146* 164* 173*  BUN 29*  --  40* 31* 30* 34* 38* 46*  CREATININE 3.48* 3.73* 4.83* 3.80* 3.25* 3.49* 3.58* 3.65*  CALCIUM 9.4  --  7.2* 6.1* 7.0* 7.1* 6.8* 6.9*  PHOS  --   --  14.5* 4.2 3.1 3.6 4.7* 3.6   Anion gap 24 CBC  Recent Labs Lab 02/18/15 0440  02/18/15 2224 02/19/15 0430 02/20/15 0509 02/21/15 0420  WBC 9.5  < > 27.5* 29.3* 10.4 5.4  NEUTROABS 7.6  --   --   --   --   --   HGB 14.2  < > 11.7* 12.7* 9.7* 8.7*  HCT 41.6  < > 34.8* 36.4* 29.2* 25.3*  MCV 81.6  < > 83.5 80.2 79.3 78.8  PLT 182  < > 218 268 120* 78*  < > = values in this interval not displayed.  Medications:    . antiseptic oral rinse  7 mL Mouth Rinse QID  . chlorhexidine gluconate  15 mL Mouth Rinse BID  . feeding supplement (PRO-STAT SUGAR FREE 64)  60 mL Per Tube TID  . feeding supplement (VITAL  HIGH PROTEIN)  1,000 mL Per Tube Q24H  . hydrocortisone sod succinate (SOLU-CORTEF) inj  50 mg Intravenous Q6H  . pantoprazole (PROTONIX) IV  40 mg Intravenous Daily  . piperacillin-tazobactam  3.375 g Intravenous 4 times per day  . sodium chloride flush  3 mL Intravenous Q12H  . vancomycin  1,250 mg Intravenous Q24H      Kathrine Cords, MD [place] Resident, PGY-2 02/21/2015, 8:53 AM  I have seen and examined this patient and agree with plan per Dr Lorenso Courier.  Metabolically stable.  BP better and on fewer pressors.  UO minimal.  COnt CVVHD as long as requiring pressors.  He is awake and alert.  Anticipate extubation soon and if off pressors will plan to DC CVVHD in AM and switch to IHD   Makinsey Pepitone T,MD 02/21/2015 9:04 AM

## 2015-02-21 NOTE — Progress Notes (Signed)
eLink Physician-Brief Progress Note Patient Name: [name] [name] DOB: [birth year] MRN: [id number]   Date of Service  02/21/2015  HPI/Events of Note  RN contacted regarding continued insulin drip at 2.8units /hr. Not tolerating PO & off tube feedings. Still continuing on CVVHD. Off vasopressors which also had dextrose infusing.  eICU Interventions  1. Continue Insulin drip for now 2. Reassess for [place] insulin once patient comes off CVVHD in AM     Intervention Category Intermediate Interventions: Hyperglycemia - evaluation and treatment  Tera Partridge 02/21/2015, 3:49 PM

## 2015-02-21 NOTE — Care Management Note (Signed)
Case Management Note  Patient Details  Name: [name] [name] MRN: [id number] Date of Birth: 04/29/1960  Subjective/Objective:    Pt admitted post suicide attempt, pt recently discharge from substance abuse facility (approximately 1 week)                Action/Plan:  Pt is from home, recent discharge from substance abuse facility.  Pt is now on ventilator and undergoing CRRT.  CM consulted CSW to assist in discharge plan.  CM will continue to monitor for disposition needs.   Expected Discharge Date:                  [facility]  In-House Referral:  Clinical Social Work  Discharge planning Services  CM Consult  Post Acute Care Choice:    Choice offered to:     DME Arranged:    DME Agency:     HH Arranged:    [place] Agency:     Status of Service:  In process, will continue to follow  Medicare Important Message Given:    Date Medicare IM Given:    Medicare IM give by:    Date Additional Medicare IM Given:    Additional Medicare Important Message give by:     If discussed at [place] of Stay Meetings, dates discussed:    Additional Comments: 02/21/2015 Pt extubated today, psych CSW consulted and following  02/21/2015 CM met with family and spoke in depth of discharge planning.  Pts son Nada Boozer and brother [name] Stabs will serve as support system for pt.  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  Psych  CSW consulted; will begin to follow pt post extubation.    02/19/15  No family at bedside Maryclare Labrador, RN 02/21/2015, 1:35 PM

## 2015-02-21 NOTE — Progress Notes (Signed)
PULMONARY / CRITICAL CARE MEDICINE HISTORY AND PHYSICAL EXAMINATION   Name: [name] [name] MRN: [id number] DOB: [birth year]    ADMISSION DATE:  02/18/2015  PRIMARY SERVICE: PCCM  CHIEF COMPLAINT:  Suicide attempt  BRIEF PATIENT DESCRIPTION: 55 y/o man with bipolar d/o who attempted suicide with polysubstance overdose and slit wrist  SIGNIFICANT EVENTS / STUDIES:  LA > 15 ARF L wrist stitched up in ED. 2/5- Started on CVVH. 2/7 CXR > persistent LLL atalectasis vs. Infiltrate.  2/8 > resolving acidosis, continued oliguric renal failure.   LINES / TUBES: PIV x2  CULTURES: 2/6 Blood Cx x2 > pending 2/7 Resp Culture > pending  ANTIBIOTICS: Vancomycin 2/7 > Zosyn 2/7 >  HISTORY OF PRESENT ILLNESS:   [name] [name] is a 55 y/o man with bipolar d/o and prior history of suicide ideations, but no apparent prior attempts who was found at home by his mother with slit wrist and empty pill bottles (Linsinopril, gabapentin, and quetiapine). He also probably took some metformin, but it is not clear how much he took. He has had worsening depression and then tonight before bed the patient mentioned to his mother that if he "ever needed to go life support" that he wouldn't want it. During the night she found him lying in the floor of his room with blood from a slit wrist and called EMS. He was also altered, but able to speak (he asked her to not call EMS). EMS found empty pill bottles although the metformin bottle was not empty, it is not known if or how much of that medication he took. In the ED he was found to be severely acidotic with profound metabolic acidosis.  PAST MEDICAL HISTORY :  Past Medical History  Diagnosis Date  . Depressed bipolar disorder ([place])   . Hypertension   . Diabetes mellitus without complication ([place])   . Sleep apnea   . Diabetic neuropathy [facility])    Past Surgical History  Procedure Laterality Date  . Spine surgery  2010  . Shoulder arthroscopy with rotator cuff  repair Right 2009   Prior to Admission medications   Medication Sig Start Date End Date Taking? Authorizing Provider  buPROPion (WELLBUTRIN SR) 150 MG 12 hr tablet Take 150 mg by mouth 2 (two) times daily.    Historical Provider, MD  gabapentin ([name]) 600 MG tablet TAKE 1 TABLET MOUTH 3 TIMES DAY Patient taking differently: Take 600 mg by mouth as needed for diabetic neuropathy. 10/03/14   Lance Bosch, NP  lisinopril-hydrochlorothiazide (PRINZIDE,ZESTORETIC) 20-12.5 MG tablet Take 1 tablet by mouth daily. 10/17/14   Lance Bosch, NP  metFORMIN (GLUCOPHAGE) 500 MG tablet Take 1 tablet (500 mg total) by mouth 2 (two) times daily with a meal. 10/17/14   Lance Bosch, NP  methadone (DOLOPHINE) 10 MG/ML solution Take 70 mg by mouth daily.     Historical Provider, MD  neomycin-bacitracin-polymyxin (NEOSPORIN) ointment Apply 1 application topically every 12 (twelve) hours. apply to eye    Historical Provider, MD  omeprazole (PRILOSEC) 20 MG capsule Take 1 capsule (20 mg total) by mouth 2 (two) times daily before a meal. Patient taking differently: Take 20 mg by mouth daily as needed (indigestion/acid reflux).  01/01/14   Barton Dubois, MD  QUEtiapine (SEROQUEL XR) 300 MG 24 hr tablet Take 300 mg by mouth at bedtime.    Historical Provider, MD   Allergies  Allergen Reactions  . Heparin   . Oxytetracycline Rash  . Sulfonamide Derivatives Rash  FAMILY HISTORY:  Family History  Problem Relation Age of Onset  . Diabetes Mother   . Hyperlipidemia Mother   . Heart disease Father    SOCIAL HISTORY:  reports that he has never smoked. He does not have any smokeless tobacco history on file. He reports that he does not drink alcohol or use illicit drugs.  REVIEW OF SYSTEMS:  Unable to obtain 2/2 AMS.  SUBJECTIVE:  Improving acidosis overnight, he is only on norepi at this time. Awake on the vent, and responsive / following commands. Improving. Continue CRRT.    VITAL SIGNS: Temp:   [97.9 F (36.6 C)-99.5 F (37.5 C)] 99.3 F (37.4 C) (02/08 0738) Pulse Rate:  [92-99] 96 (02/08 0800) Resp:  [11-30] 17 (02/08 0800) BP: (85-129)/(54-78) 129/66 mmHg (02/08 0743) SpO2:  [99 %-100 %] 100 % (02/08 0800) Arterial Line BP: (89-140)/(49-71) 106/54 mmHg (02/08 0800) FiO2 (%):  [40 %] 40 % (02/08 0800) Weight:  [255 lb 15.3 oz (116.1 kg)] 255 lb 15.3 oz (116.1 kg) (02/08 0407) HEMODYNAMICS:   VENTILATOR SETTINGS: Vent Mode:  [-] PSV;CPAP FiO2 (%):  [40 %] 40 % Set Rate:  [25 bmp] 25 bmp PEEP:  [5 cmH20] 5 cmH20 Plateau Pressure:  [18 cmH20-23 cmH20] 18 cmH20 INTAKE / OUTPUT: Intake/Output      02/07 0701 - 02/08 0700 02/08 0701 - 02/09 0700   I.V. (mL/kg) 3216.5 (27.7) 50.4 (0.4)   Other 100    NG/GT 2080 40   IV Piggyback 450    Total Intake(mL/kg) 5846.5 (50.4) 90.4 (0.8)   Urine (mL/kg/hr) 50 (0) 0 (0)   Other 4880 (1.8) 187 (1.3)   Stool 0 (0)    Total Output 4930 187   Net +916.5 -96.6        Stool Occurrence 1 x      PHYSICAL EXAMINATION: General: No distress, awake, alert, responding to commands.  Neuro:  PERRL, no gross focal deficits HEENT:  Supple, no obvious JVD, ETT and OGT in place.  Cardiovascular:  RRR. No MRG Lungs:  Clear, No crackles, wheezing Abdomen:  Soft, + BS Musculoskeletal:  No swollen joints, nontender Skin:  No rashes  LABS:  CBC  Recent Labs Lab 02/19/15 0430 02/20/15 0509 02/21/15 0420  WBC 29.3* 10.4 5.4  HGB 12.7* 9.7* 8.7*  HCT 36.4* 29.2* 25.3*  PLT 268 120* 78*   Coag's  Recent Labs Lab 02/18/15 2224 02/19/15 0430 02/19/15 0733 02/19/15 1230  APTT  --  >200* >200* 97*  INR 1.98* 1.86*  --   --    BMET  Recent Labs Lab 02/20/15 0509 02/20/15 1712 02/21/15 0415  NA 133* 132* 133*  K 3.8 4.1 4.1  CL 91* 89* 89*  CO2 18* 21* 23  BUN 34* 38* 46*  CREATININE 3.49* 3.58* 3.65*  GLUCOSE 146* 164* 173*   Electrolytes  Recent Labs Lab 02/19/15 0430  02/20/15 0509 02/20/15 1712 02/21/15 0415   CALCIUM 6.1*  < > 7.1* 6.8* 6.9*  MG 1.8  --  1.9  --  2.3  PHOS 4.2  < > 3.6 4.7* 3.6  < > = values in this interval not displayed. Sepsis Markers  Recent Labs Lab 02/20/15 0903  02/21/15 0001 02/21/15 0300 02/21/15 0415 02/21/15 [id number]  LATICACIDVEN  --   < > 9.9* 10.1*  --  8.5*  PROCALCITON 5.47  --   --   --  3.97  --   < > = values in this interval not displayed. ABG  Recent Labs Lab 02/19/15 0310 02/20/15 0235 02/21/15 0746  PHART 7.267* 7.444 7.483*  PCO2ART 20.2* 25.9* 37.5  PO2ART 130* 114* 141.0*   Liver Enzymes  Recent Labs Lab 02/18/15 2224 02/19/15 0430  02/20/15 1211 02/20/15 1712 02/21/15 0415  AST 34 48*  --  54*  --   --   ALT 23 <5*  --  37  --   --   ALKPHOS 35* 43  --  44  --   --   BILITOT 1.0 0.9  --  0.6  --   --   ALBUMIN 2.4* 2.8*  < > 2.4* 2.4* 2.2*  < > = values in this interval not displayed. Cardiac Enzymes  Recent Labs Lab 02/20/15 1211 02/20/15 1712 02/20/15 2150  TROPONINI 1.34* 1.10* 0.97*   Glucose  Recent Labs Lab 02/21/15 0103 02/21/15 0202 02/21/15 0359 02/21/15 0455 02/21/15 0559 02/21/15 0648  GLUCAP 190* 182* 169* 133* 156* 161*    Imaging Ct Abdomen Pelvis Wo Contrast  02/20/2015  CLINICAL DATA:  Distended abdomen pain EXAM: CT ABDOMEN AND PELVIS WITHOUT CONTRAST TECHNIQUE: Multidetector CT imaging of the abdomen and pelvis was performed following the standard protocol without IV contrast. COMPARISON:  None. FINDINGS: Lung bases demonstrate right lower lobe consolidation and small effusion. On image number 1 of series 201, there is suggestion of a focal mass lesion. This may represent rounded pneumonia. Left basilar atelectasis is noted as well. The liver, gallbladder, spleen, adrenal glands and pancreas are within normal limits. A nasogastric catheter is noted within the stomach. The kidneys demonstrate no renal calculi or obstructive changes. The appendix is not well visualized although no inflammatory  changes to suggest appendicitis are seen. The bladder is decompressed by Foley catheter. No pelvic mass lesion is noted. Arterial and venous catheters are noted in the left inguinal region. No acute bony abnormality is noted. IMPRESSION: No explanation for the patient's distended abdomen is noted. Changes in the bases bilaterally. There is some suggestion of a masslike density within the right lower lobe. Follow-up CT of the chest when the patient's condition improves is recommended. Imaging at this point would not be helpful given the consolidation and effusion noted. Electronically Signed   By: Inez Catalina M.D.   On: 02/20/2015 15:43   US Renal  02/19/2015  CLINICAL DATA:  Acute renal failure, on vent EXAM: RENAL / URINARY TRACT ULTRASOUND COMPLETE COMPARISON:  None. FINDINGS: Right Kidney: Length: 12.3 cm.  No mass or hydronephrosis. Left Kidney: Length: 16.0 cm.  No mass or hydronephrosis. Bladder: Decompressed by indwelling Foley catheter. IMPRESSION: No hydronephrosis. Bladder decompressed by indwelling Foley catheter. Electronically Signed   By: Julian Hy M.D.   On: 02/19/2015 12:35   Dg Chest Port 1 View  02/21/2015  CLINICAL DATA:  Acute respiratory failure, intubated patient, overdose, acute renal failure. EXAM: PORTABLE CHEST 1 VIEW COMPARISON:  Portable chest x-ray of February 20, 2015 FINDINGS: The lungs are better inflated today. Subsegmental atelectasis in the right lower lung persists. The left lung is clear. The heart and pulmonary vascularity are normal. The endotracheal tube tip lies 5.5 cm above the carina. The esophagogastric tube tip projects below the inferior margin of the image. The bony structures exhibit no acute abnormalities. IMPRESSION: Persistent subsegmental atelectasis at the right lung base. Overall improved aeration of the lungs. The support tubes are in reasonable position. Electronically Signed   By: David  Martinique M.D.   On: 02/21/2015 07:09   Dg Chest Port 1  View  02/20/2015  CLINICAL DATA:  Respiratory failure. EXAM: PORTABLE CHEST 1 VIEW COMPARISON:  02/19/2015. FINDINGS: Endotracheal tube and NG tube in stable position. Heart size stable. Persistent low lung volumes with progressive right base atelectasis and or infiltrate. No pleural effusion pneumothorax. IMPRESSION: 1. Lines and tubes in stable position. 2. Persistent low lung volumes with progressive right lower lobe atelectasis and or infiltrate. Electronically Signed   By: Marcello Moores  Register   On: 02/20/2015 07:04    EKG: NSR, Mild QTc prolongation CXR 2/6: No infiltrates.  ASSESSMENT / PLAN:  Active Problems:   Overdose   Acute respiratory failure with hypoxia ([facility])   Arterial hypotension   AKI (acute kidney injury) ([place])   Acute renal failure (ARF) ([facility])   Lactic acidosis   Shock circulatory ([facility])   Metabolic acidosis   Respiratory failure requiring intubation ([facility])   PULMONARY A: Respiratory Alkalosis compensating for metabolic acidosis - resolving Intubated for resp protection P:   Continue full vent support for now > wean today.  Treating for HCAP Abx as below.   CARDIOVASCULAR A: Hypotension- Likely secondary to drug OD, acidosis. Lisinopril o/d P:   Weaned to 1 pressor.  Weaning down levo as tolerated Continue Stress dose steroids last dose today. Echo with preserved EF and mild DD.   RENAL A: Acute oliguric renal failure Severe lactic acidosis P:   Continue CVVH Stopped Heparin yesterday, add Citrate if clotting becomes an issue.  Poor urine output. Hopeful for recovery but will see.  Appreciate renal input.    GASTROINTESTINAL A: No acute issues P:   Stress ulcer ppx Checking LFT's  HEMATOLOGIC A: Slit wrist Elevated PTT Anemia Thrombocytopenia - Concern for HITT P:   Platelets dropping rapidly.  HITT panel pending Stopped all Heparin on 2/7.  Continue to monitor coags. Worsening anemia - Txf < hgb 7.  Consider Hemolysis panel.    INFECTIOUS A: Febrile with Increasing Lactic Acid - resolving Leukocytosis - resolving P:   Blood cx x 2 drawn and pending Respiratory cx pending CXR with RLL infiltrate Started on Vanc / Zosyn 2/7 > PCT algorithm, 5.47 > 3.97 CT Chest to further evaluate RLL mass vs. Infiltrate.   ENDOCRINE A: No active issues P:   Monitor CBG  Psych/NEUROLOGIC A: Suicide attempt AMS Polysubstance o/d P:   - Seroquel, gabapentin should clear with supportive care. - On CVVH. - Discussed with poison control. Discontinued Fomepazole.  Continue to monitor cardiac / renal function.  - Psych consult when able.    BEST PRACTICE / DISPOSITION Level of Care:  ICU Primary Service:  PCCM Consultants:  Nephro Code Status:  Full Diet:  NPO DVT Px:  SCD's. GI Px:  None Skin Integrity:  Intact Social / Family:  Updated  Note: Code status discussions noted from yesterday. Although the pt stated that he did not want to be on life support. But this was expressed when he was depressed. We will continue to heep him full code for now.   TODAY'S SUMMARY: 55 y/o man with bipolar d/o presenting with polysubstance o/d and severe lactic acidosis concerning for metformin overdose now improving with renal replacement therapy and ICU support.   Paula Compton, MD Family Medicine - PGY 2  Attending Note:  55 year old male with suicide attempt who was profoundly acidotic and required CRRT, now mental status is much improved and he is doing very well.  Concern is renal function has not improved.  On exam, patient is alert and  interactive, moving all ext to command, will extubate and continue hydration for now.  CRRT will continue for now.  Appreciate assistance from renal services.  CXR that I reviewed myself with no acute disease this AM.  Will d/c TF, no need for swallow evaluation, hold ambulation for now given femoral catheter.  The patient is critically ill with multiple organ systems failure and  requires high complexity decision making for assessment and support, frequent evaluation and titration of therapies, application of advanced monitoring technologies and extensive interpretation of multiple databases.   Critical Care Time devoted to patient care services described in this note is  35  Minutes. This time reflects time of care of this signee Dr [name]. This critical care time does not reflect procedure time, or teaching time or supervisory time of PA/NP/Med student/Med Resident etc but could involve care discussion time.  Rush Farmer, M.D. [facility] Pulmonary/Critical Care Medicine. Pager: [phone]. After hours pager: [phone].

## 2015-02-21 NOTE — Progress Notes (Signed)
When pt was extubated and tube holder removed a skin tear to the LEFT cheek was noted. Skin tear left open to air. Will continue to monitor.

## 2015-02-21 NOTE — Consult Note (Signed)
[facility] Consult   Reason for Consult:  Bipolar depression and status post suicide attempt by cutting his wrist and OD of non psychotropics Referring Physician:  Dr. Nelda Marseille Patient Identification: CALIBER LANDESS MRN:  [id number] Principal Diagnosis: Bipolar I disorder, most recent episode depressed [facility]) Diagnosis:   Patient Active Problem List   Diagnosis Date Noted  . Bipolar I disorder, most recent episode depressed ([place]) [F31.30] 02/21/2015  . Respiratory failure requiring intubation ([place]) [J96.90]   . Overdose [T50.901A] 02/18/2015  . Acute renal failure (ARF) ([place]) [N17.9] 02/18/2015  . Lactic acidosis [E87.2] 02/18/2015  . Shock circulatory ([place]) [R57.9] 02/18/2015  . Acute respiratory failure with hypoxia ([place]) [J96.01]   . Arterial hypotension [I95.9]   . AKI (acute kidney injury) ([place]) [N17.9]   . Metabolic acidosis [J69.6]   . Type 2 diabetes mellitus with diabetic polyneuropathy ([place]) [E11.42] 06/09/2014  . Depressed bipolar disorder ([place]) [F31.30] 01/01/2014  . Chest pain [R07.9] 12/31/2013  . HERPES ZOSTER [B02.9] 12/05/2008  . SINUSITIS- ACUTE-NOS [J01.90] 12/05/2008  . Hyperlipidemia [E78.5] 05/11/2008  . DEPRESSION [F32.9] 05/11/2008  . Essential hypertension [I10] 05/11/2008  . GERD [K21.9] 05/11/2008  . OSTEOARTHRITIS [M19.90] 05/11/2008  . LOW BACK PAIN [M54.5] 05/11/2008  . COLONIC POLYPS, HX OF [Z86.010] 05/11/2008    Total Time spent with patient: 1 hour  Subjective:   SANFORD LINDBLAD is a 55 y.o. male patient admitted with suicide attempt.  HPI:  [name] [name] is a 55 y/o man seen, chart reviewed for face-to-face psychiatric consultation and evaluation of increased symptoms of bipolar depression and status post suicide attempt by cutting his wrist and also intentional overdose of prescription medication. Patient reported being depressed, sad, isolated, withdrawn, lack of motivation and having ongoing suicidal thoughts before  he acted on them. Patient stated he has been living in his parent's home. Patient reportedly has no previous suicidal attempts. Patient intentionally overdosed his medication lists no prior, but gabapentin and Seroquel. Patient reportedly stopped taking his medication Wellbutrin because he cannot afford to pay $25 every month. Patient reported he has chosen to pay for his daughter car payment instead of buying his medication because his daughter is an graduation school and he care for her. He has had worsening depression and then tonight before bed the patient mentioned to his mother that if he "ever needed to go life support" that he wouldn't want it.   Past Psychiatric History:  Patient has been diagnosed with bipolar disorder and substance abuse and he sees outpatient medication management from [facility] mental health.   Risk to Self: Is patient at risk for suicide?: Yes Risk to Others:   Prior Inpatient Therapy:   Prior Outpatient Therapy:    Past Medical History:  Past Medical History  Diagnosis Date  . Depressed bipolar disorder ([place])   . Hypertension   . Diabetes mellitus without complication ([place])   . Sleep apnea   . Diabetic neuropathy [facility])     Past Surgical History  Procedure Laterality Date  . Spine surgery  2010  . Shoulder arthroscopy with rotator cuff repair Right 2009   Family History:  Family History  Problem Relation Age of Onset  . Diabetes Mother   . Hyperlipidemia Mother   . Heart disease Father    Family Psychiatric  History: unknown Social History:  History  Alcohol Use No     History  Drug Use No    Social History   Social History  . Marital Status: Single  Spouse Name: N/A  . Number of Children: N/A  . Years of Education: N/A   Social History Main Topics  . Smoking status: Never Smoker   . Smokeless tobacco: None  . Alcohol Use: No  . Drug Use: No  . Sexual Activity: Not Asked   Other Topics Concern  . None   Social History Narrative    Additional Social History:    Allergies:   Allergies  Allergen Reactions  . Heparin   . Oxytetracycline Rash  . Sulfonamide Derivatives Rash    Labs:  Results for orders placed or performed during the hospital encounter of 02/18/15 (from the past 48 hour(s))  Glucose, capillary     Status: Abnormal   Collection Time: 02/19/15 11:21 AM  Result Value Ref Range   Glucose-Capillary 126 (H) 65 - 99 mg/dL  Methemoglobin     Status: None   Collection Time: 02/19/15 11:25 AM  Result Value Ref Range   Methemoglobin 0.8 0.0 - 1.5 %  Urinalysis, Routine w reflex microscopic (not at [facility])     Status: Abnormal   Collection Time: 02/19/15 11:43 AM  Result Value Ref Range   Color, Urine BLUE (A) YELLOW    Comment: BIOCHEMICALS MAY BE AFFECTED BY COLOR   APPearance TURBID (A) CLEAR   Specific Gravity, Urine 1.020 1.005 - 1.030   pH 7.5 5.0 - 8.0   Glucose, UA 100 (A) NEGATIVE mg/dL   Hgb urine dipstick LARGE (A) NEGATIVE   Bilirubin Urine NEGATIVE NEGATIVE   Ketones, ur 15 (A) NEGATIVE mg/dL   Protein, ur >300 (A) NEGATIVE mg/dL   Nitrite NEGATIVE NEGATIVE   Leukocytes, UA TRACE (A) NEGATIVE  Urine microscopic-add on     Status: Abnormal   Collection Time: 02/19/15 11:43 AM  Result Value Ref Range   Squamous Epithelial / LPF 0-5 (A) NONE SEEN   WBC, UA 0-5 0 - 5 WBC/hpf   RBC / HPF 6-30 0 - 5 RBC/hpf   Bacteria, UA MANY (A) NONE SEEN   Urine-Other MICROSCOPIC EXAM PERFORMED ON UNCONCENTRATED URINE   Na and K (sodium & potassium), rand urine     Status: None   Collection Time: 02/19/15 11:44 AM  Result Value Ref Range   Sodium, Ur 42 mmol/L   Potassium Urine Timed 17 mmol/L  Creatinine, urine, random     Status: None   Collection Time: 02/19/15 11:44 AM  Result Value Ref Range   Creatinine, Urine 122.67 mg/dL  Lactic acid, plasma     Status: Abnormal   Collection Time: 02/19/15 11:49 AM  Result Value Ref Range   Lactic Acid, Venous 8.7 (HH) 0.5 - 2.0 mmol/L    Comment:  CRITICAL RESULT CALLED TO, READ BACK BY AND VERIFIED WITH: TOLER,M RN @ 1236 02/19/15 LEONARD,A   POCT Activated clotting time     Status: None   Collection Time: 02/19/15 12:02 PM  Result Value Ref Range   Activated Clotting Time 245 seconds  Glucose, capillary     Status: None   Collection Time: 02/19/15 12:25 PM  Result Value Ref Range   Glucose-Capillary 93 65 - 99 mg/dL  APTT     Status: Abnormal   Collection Time: 02/19/15 12:30 PM  Result Value Ref Range   aPTT 97 (H) 24 - 37 seconds    Comment:        IF BASELINE aPTT IS ELEVATED, SUGGEST PATIENT RISK ASSESSMENT BE USED TO DETERMINE APPROPRIATE ANTICOAGULANT THERAPY.   POCT Activated clotting time  Status: None   Collection Time: 02/19/15  1:12 PM  Result Value Ref Range   Activated Clotting Time 204 seconds  Glucose, capillary     Status: Abnormal   Collection Time: 02/19/15  1:14 PM  Result Value Ref Range   Glucose-Capillary 100 (H) 65 - 99 mg/dL  POCT Activated clotting time     Status: None   Collection Time: 02/19/15  2:12 PM  Result Value Ref Range   Activated Clotting Time 183 seconds  Glucose, capillary     Status: Abnormal   Collection Time: 02/19/15  2:17 PM  Result Value Ref Range   Glucose-Capillary 127 (H) 65 - 99 mg/dL  Renal function panel (daily at 1600)     Status: Abnormal   Collection Time: 02/19/15  3:24 PM  Result Value Ref Range   Sodium 137 135 - 145 mmol/L   Potassium 3.4 (L) 3.5 - 5.1 mmol/L   Chloride 92 (L) 101 - 111 mmol/L   CO2 21 (L) 22 - 32 mmol/L   Glucose, Bld 142 (H) 65 - 99 mg/dL   BUN 30 (H) 6 - 20 mg/dL   Creatinine, Ser 3.25 (H) 0.61 - 1.24 mg/dL   Calcium 7.0 (L) 8.9 - 10.3 mg/dL   Phosphorus 3.1 2.5 - 4.6 mg/dL   Albumin 2.5 (L) 3.5 - 5.0 g/dL   GFR calc non Af Amer 20 (L) >60 mL/min   GFR calc Af Amer 23 (L) >60 mL/min    Comment: (NOTE) The eGFR has been calculated using the CKD EPI equation. This calculation has not been validated in all clinical  situations. eGFR's persistently <60 mL/min signify possible Chronic Kidney Disease.    Anion gap 24 (H) 5 - 15    Comment: REPEATED TO VERIFY  Glucose, capillary     Status: Abnormal   Collection Time: 02/19/15  3:24 PM  Result Value Ref Range   Glucose-Capillary 132 (H) 65 - 99 mg/dL  Lactic acid, plasma     Status: Abnormal   Collection Time: 02/19/15  3:29 PM  Result Value Ref Range   Lactic Acid, Venous 8.4 (HH) 0.5 - 2.0 mmol/L    Comment: CRITICAL RESULT CALLED TO, READ BACK BY AND VERIFIED WITH: RN TOLER,M AT 1603 [phone] MARTINB   POCT Activated clotting time     Status: None   Collection Time: 02/19/15  3:35 PM  Result Value Ref Range   Activated Clotting Time 168 seconds  Glucose, capillary     Status: Abnormal   Collection Time: 02/19/15  4:22 PM  Result Value Ref Range   Glucose-Capillary 173 (H) 65 - 99 mg/dL  POCT Activated clotting time     Status: None   Collection Time: 02/19/15  4:25 PM  Result Value Ref Range   Activated Clotting Time 157 seconds  Glucose, capillary     Status: Abnormal   Collection Time: 02/19/15  5:35 PM  Result Value Ref Range   Glucose-Capillary 182 (H) 65 - 99 mg/dL  POCT Activated clotting time     Status: None   Collection Time: 02/19/15  5:38 PM  Result Value Ref Range   Activated Clotting Time 209 seconds  Glucose, capillary     Status: Abnormal   Collection Time: 02/19/15  6:23 PM  Result Value Ref Range   Glucose-Capillary 186 (H) 65 - 99 mg/dL  POCT Activated clotting time     Status: None   Collection Time: 02/19/15  6:28 PM  Result Value Ref Range  Activated Clotting Time 219 seconds  Lactic acid, plasma     Status: Abnormal   Collection Time: 02/19/15  6:29 PM  Result Value Ref Range   Lactic Acid, Venous 10.1 (HH) 0.5 - 2.0 mmol/L    Comment: CRITICAL RESULT CALLED TO, READ BACK BY AND VERIFIED WITH: RN TOLER,M AT 1909 [phone] MARTINB   Glucose, capillary     Status: Abnormal   Collection Time: 02/19/15  7:23  PM  Result Value Ref Range   Glucose-Capillary 167 (H) 65 - 99 mg/dL  POCT Activated clotting time     Status: None   Collection Time: 02/19/15  7:30 PM  Result Value Ref Range   Activated Clotting Time 224 seconds  POCT Activated clotting time     Status: None   Collection Time: 02/19/15  8:33 PM  Result Value Ref Range   Activated Clotting Time 234 seconds  Glucose, capillary     Status: Abnormal   Collection Time: 02/19/15  8:38 PM  Result Value Ref Range   Glucose-Capillary 154 (H) 65 - 99 mg/dL  Lactic acid, plasma     Status: Abnormal   Collection Time: 02/19/15  9:01 PM  Result Value Ref Range   Lactic Acid, Venous 10.9 (HH) 0.5 - 2.0 mmol/L    Comment: CRITICAL RESULT CALLED TO, READ BACK BY AND VERIFIED WITH: SMITH C,RN 02/19/15 2134 [facility]   POCT Activated clotting time     Status: None   Collection Time: 02/19/15  9:38 PM  Result Value Ref Range   Activated Clotting Time 229 seconds  Glucose, capillary     Status: Abnormal   Collection Time: 02/19/15  9:39 PM  Result Value Ref Range   Glucose-Capillary 153 (H) 65 - 99 mg/dL  Glucose, capillary     Status: Abnormal   Collection Time: 02/19/15 10:45 PM  Result Value Ref Range   Glucose-Capillary 140 (H) 65 - 99 mg/dL  POCT Activated clotting time     Status: None   Collection Time: 02/19/15 10:49 PM  Result Value Ref Range   Activated Clotting Time 234 seconds  POCT Activated clotting time     Status: None   Collection Time: 02/19/15 11:41 PM  Result Value Ref Range   Activated Clotting Time 229 seconds  Glucose, capillary     Status: Abnormal   Collection Time: 02/19/15 11:44 PM  Result Value Ref Range   Glucose-Capillary 156 (H) 65 - 99 mg/dL  Lactic acid, plasma     Status: Abnormal   Collection Time: 02/19/15 11:55 PM  Result Value Ref Range   Lactic Acid, Venous 12.6 (HH) 0.5 - 2.0 mmol/L    Comment: RESULTS CONFIRMED BY MANUAL DILUTION CRITICAL RESULT CALLED TO, READ BACK BY AND VERIFIED WITH: H[place]  M,RN 02/20/15 0057 WAYK   POCT Activated clotting time     Status: None   Collection Time: 02/20/15 12:45 AM  Result Value Ref Range   Activated Clotting Time 224 seconds  Glucose, capillary     Status: Abnormal   Collection Time: 02/20/15 12:46 AM  Result Value Ref Range   Glucose-Capillary 144 (H) 65 - 99 mg/dL  POCT Activated clotting time     Status: None   Collection Time: 02/20/15  1:51 AM  Result Value Ref Range   Activated Clotting Time 229 seconds  Glucose, capillary     Status: Abnormal   Collection Time: 02/20/15  1:52 AM  Result Value Ref Range   Glucose-Capillary 157 (H) 65 -  99 mg/dL  Blood gas, arterial     Status: Abnormal   Collection Time: 02/20/15  2:35 AM  Result Value Ref Range   FIO2 0.40    Delivery systems VENTILATOR    Mode PRESSURE CONTROL    LHR 25 resp/min   Peep/cpap 5.0 cm H20   PIP 20.0 cm H2O   pH, Arterial 7.444 7.350 - 7.450   pCO2 arterial 25.9 (L) 35.0 - 45.0 mmHg   pO2, Arterial 114 (H) 80.0 - 100.0 mmHg   Bicarbonate 17.4 (L) 20.0 - 24.0 mEq/L   TCO2 18.2 0 - 100 mmol/L   Acid-base deficit 5.9 (H) 0.0 - 2.0 mmol/L   O2 Saturation 98.7 %   Patient temperature 99.0    Collection site ARTERIAL LINE    Drawn by [phone]    Sample type ARTERIAL DRAW    Allens test (pass/fail) PASS PASS  POCT Activated clotting time     Status: None   Collection Time: 02/20/15  2:58 AM  Result Value Ref Range   Activated Clotting Time 214 seconds  Glucose, capillary     Status: Abnormal   Collection Time: 02/20/15  2:59 AM  Result Value Ref Range   Glucose-Capillary 164 (H) 65 - 99 mg/dL  Lactic acid, plasma     Status: Abnormal   Collection Time: 02/20/15  3:20 AM  Result Value Ref Range   Lactic Acid, Venous 12.3 (HH) 0.5 - 2.0 mmol/L    Comment: RESULTS CONFIRMED BY MANUAL DILUTION CRITICAL RESULT CALLED TO, READ BACK BY AND VERIFIED WITH: HANCOCK M,RN 02/20/15 0420 WAYK   POCT Activated clotting time     Status: None   Collection Time: 02/20/15   3:56 AM  Result Value Ref Range   Activated Clotting Time 219 seconds  Glucose, capillary     Status: Abnormal   Collection Time: 02/20/15  3:57 AM  Result Value Ref Range   Glucose-Capillary 153 (H) 65 - 99 mg/dL  Culture, respiratory (NON-Expectorated)     Status: None (Preliminary result)   Collection Time: 02/20/15  4:14 AM  Result Value Ref Range   Specimen Description TRACHEAL ASPIRATE    Special Requests Normal    Gram Stain      ABUNDANT WBC PRESENT, PREDOMINANTLY PMN NO SQUAMOUS EPITHELIAL CELLS SEEN ABUNDANT GRAM POSITIVE COCCI IN PAIRS FEW GRAM POSITIVE RODS FEW GRAM NEGATIVE RODS Performed at [facility]    Culture      Culture reincubated for better growth Performed at [facility]    Report Status PENDING   Urinalysis, Routine w reflex microscopic (not at [facility])     Status: Abnormal   Collection Time: 02/20/15  4:51 AM  Result Value Ref Range   Color, Urine GREEN (A) YELLOW    Comment: BIOCHEMICALS MAY BE AFFECTED BY COLOR   APPearance TURBID (A) CLEAR   Specific Gravity, Urine 1.022 1.005 - 1.030   pH 6.5 5.0 - 8.0   Glucose, UA NEGATIVE NEGATIVE mg/dL   Hgb urine dipstick LARGE (A) NEGATIVE   Bilirubin Urine NEGATIVE NEGATIVE   Ketones, ur NEGATIVE NEGATIVE mg/dL   Protein, ur 30 (A) NEGATIVE mg/dL   Nitrite NEGATIVE NEGATIVE   Leukocytes, UA NEGATIVE NEGATIVE  Urine microscopic-add on     Status: Abnormal   Collection Time: 02/20/15  4:51 AM  Result Value Ref Range   Squamous Epithelial / LPF 0-5 (A) NONE SEEN   WBC, UA 0-5 0 - 5 WBC/hpf   RBC / HPF  6-30 0 - 5 RBC/hpf   Bacteria, UA MANY (A) NONE SEEN   Casts HYALINE CASTS (A) NEGATIVE   Urine-Other AMORPHOUS URATES/PHOSPHATES   POCT Activated clotting time     Status: None   Collection Time: 02/20/15  4:56 AM  Result Value Ref Range   Activated Clotting Time 224 seconds  Glucose, capillary     Status: Abnormal   Collection Time: 02/20/15  4:57 AM  Result Value Ref Range    Glucose-Capillary 152 (H) 65 - 99 mg/dL  Magnesium     Status: None   Collection Time: 02/20/15  5:09 AM  Result Value Ref Range   Magnesium 1.9 1.7 - 2.4 mg/dL  CBC     Status: Abnormal   Collection Time: 02/20/15  5:09 AM  Result Value Ref Range   WBC 10.4 4.0 - 10.5 K/uL   RBC 3.68 (L) 4.22 - 5.81 MIL/uL   Hemoglobin 9.7 (L) 13.0 - 17.0 g/dL    Comment: DELTA CHECK NOTED REPEATED TO VERIFY    HCT 29.2 (L) 39.0 - 52.0 %   MCV 79.3 78.0 - 100.0 fL   MCH 26.4 26.0 - 34.0 pg   MCHC 33.2 30.0 - 36.0 g/dL   RDW 14.4 11.5 - 15.5 %   Platelets 120 (L) 150 - 400 K/uL  Basic metabolic panel     Status: Abnormal   Collection Time: 02/20/15  5:09 AM  Result Value Ref Range   Sodium 133 (L) 135 - 145 mmol/L   Potassium 3.8 3.5 - 5.1 mmol/L   Chloride 91 (L) 101 - 111 mmol/L   CO2 18 (L) 22 - 32 mmol/L   Glucose, Bld 146 (H) 65 - 99 mg/dL   BUN 34 (H) 6 - 20 mg/dL   Creatinine, Ser 3.49 (H) 0.61 - 1.24 mg/dL   Calcium 7.1 (L) 8.9 - 10.3 mg/dL   GFR calc non Af Amer 19 (L) >60 mL/min   GFR calc Af Amer 21 (L) >60 mL/min    Comment: (NOTE) The eGFR has been calculated using the CKD EPI equation. This calculation has not been validated in all clinical situations. eGFR's persistently <60 mL/min signify possible Chronic Kidney Disease.    Anion gap 24 (H) 5 - 15  Phosphorus     Status: None   Collection Time: 02/20/15  5:09 AM  Result Value Ref Range   Phosphorus 3.6 2.5 - 4.6 mg/dL  Albumin     Status: Abnormal   Collection Time: 02/20/15  5:09 AM  Result Value Ref Range   Albumin 2.4 (L) 3.5 - 5.0 g/dL  POCT Activated clotting time     Status: None   Collection Time: 02/20/15  5:52 AM  Result Value Ref Range   Activated Clotting Time 224 seconds  Glucose, capillary     Status: Abnormal   Collection Time: 02/20/15  5:55 AM  Result Value Ref Range   Glucose-Capillary 163 (H) 65 - 99 mg/dL  Lactic acid, plasma     Status: Abnormal   Collection Time: 02/20/15  6:16 AM  Result  Value Ref Range   Lactic Acid, Venous 11.9 (HH) 0.5 - 2.0 mmol/L    Comment: RESULTS CONFIRMED BY MANUAL DILUTION CRITICAL RESULT CALLED TO, READ BACK BY AND VERIFIED WITH: M.TOLER,RN 02/20/15 0720 BY BSLADE   Glucose, capillary     Status: Abnormal   Collection Time: 02/20/15  6:53 AM  Result Value Ref Range   Glucose-Capillary 114 (H) 65 - 99 mg/dL  POCT Activated clotting time     Status: None   Collection Time: 02/20/15  6:53 AM  Result Value Ref Range   Activated Clotting Time 229 seconds  POCT Activated clotting time     Status: None   Collection Time: 02/20/15  7:56 AM  Result Value Ref Range   Activated Clotting Time 234 seconds  Glucose, capillary     Status: Abnormal   Collection Time: 02/20/15  7:59 AM  Result Value Ref Range   Glucose-Capillary 165 (H) 65 - 99 mg/dL  Glucose, capillary     Status: Abnormal   Collection Time: 02/20/15  8:59 AM  Result Value Ref Range   Glucose-Capillary 164 (H) 65 - 99 mg/dL  POCT Activated clotting time     Status: None   Collection Time: 02/20/15  9:01 AM  Result Value Ref Range   Activated Clotting Time 229 seconds  Procalcitonin - Baseline     Status: None   Collection Time: 02/20/15  9:03 AM  Result Value Ref Range   Procalcitonin 5.47 ng/mL    Comment:        Interpretation: PCT > 2 ng/mL: Systemic infection (sepsis) is likely, unless other causes are known. (NOTE)         ICU PCT Algorithm               Non ICU PCT Algorithm    ----------------------------     ------------------------------         PCT < 0.25 ng/mL                 PCT < 0.1 ng/mL     Stopping of antibiotics            Stopping of antibiotics       strongly encouraged.               strongly encouraged.    ----------------------------     ------------------------------       PCT level decrease by               PCT < 0.25 ng/mL       >= 80% from peak PCT       OR PCT 0.25 - 0.5 ng/mL          Stopping of antibiotics                                              encouraged.     Stopping of antibiotics           encouraged.    ----------------------------     ------------------------------       PCT level decrease by              PCT >= 0.25 ng/mL       < 80% from peak PCT        AND PCT >= 0.5 ng/mL            Continuing antibiotics                                               encouraged.       Continuing antibiotics            encouraged.    ----------------------------     ------------------------------  PCT level increase compared          PCT > 0.5 ng/mL         with peak PCT AND          PCT >= 0.5 ng/mL             Escalation of antibiotics                                          strongly encouraged.      Escalation of antibiotics        strongly encouraged.   Lactic acid, plasma     Status: Abnormal   Collection Time: 02/20/15  9:04 AM  Result Value Ref Range   Lactic Acid, Venous 11.4 (HH) 0.5 - 2.0 mmol/L    Comment: RESULTS CONFIRMED BY MANUAL DILUTION CRITICAL RESULT CALLED TO, READ BACK BY AND VERIFIED WITH: M.TOLER,RN 02/20/15 1012 BY BSLADE   POCT Activated clotting time     Status: None   Collection Time: 02/20/15 10:01 AM  Result Value Ref Range   Activated Clotting Time 229 seconds  Glucose, capillary     Status: Abnormal   Collection Time: 02/20/15 10:04 AM  Result Value Ref Range   Glucose-Capillary 166 (H) 65 - 99 mg/dL  Glucose, capillary     Status: Abnormal   Collection Time: 02/20/15 11:07 AM  Result Value Ref Range   Glucose-Capillary 168 (H) 65 - 99 mg/dL  POCT Activated clotting time     Status: None   Collection Time: 02/20/15 11:11 AM  Result Value Ref Range   Activated Clotting Time 224 seconds  POCT Activated clotting time     Status: None   Collection Time: 02/20/15 11:55 AM  Result Value Ref Range   Activated Clotting Time 224 seconds  Glucose, capillary     Status: Abnormal   Collection Time: 02/20/15 12:00 PM  Result Value Ref Range   Glucose-Capillary 174 (H) 65 - 99 mg/dL  CK      Status: Abnormal   Collection Time: 02/20/15 12:11 PM  Result Value Ref Range   Total CK 2257 (H) 49 - 397 U/L  Hepatic function panel     Status: Abnormal   Collection Time: 02/20/15 12:11 PM  Result Value Ref Range   Total Protein 5.0 (L) 6.5 - 8.1 g/dL   Albumin 2.4 (L) 3.5 - 5.0 g/dL   AST 54 (H) 15 - 41 U/L   ALT 37 17 - 63 U/L   Alkaline Phosphatase 44 38 - 126 U/L   Total Bilirubin 0.6 0.3 - 1.2 mg/dL   Bilirubin, Direct 0.1 0.1 - 0.5 mg/dL   Indirect Bilirubin 0.5 0.3 - 0.9 mg/dL  Troponin I (q 6hr x 3)     Status: Abnormal   Collection Time: 02/20/15 12:11 PM  Result Value Ref Range   Troponin I 1.34 (HH) <0.031 ng/mL    Comment:        POSSIBLE MYOCARDIAL ISCHEMIA. SERIAL TESTING RECOMMENDED. CRITICAL RESULT CALLED TO, READ BACK BY AND VERIFIED WITH: MICHELLE TOLER,RN AT 1335 02/20/15 BY ZBEECH.   Lactic acid, plasma     Status: Abnormal   Collection Time: 02/20/15 12:13 PM  Result Value Ref Range   Lactic Acid, Venous 11.1 (HH) 0.5 - 2.0 mmol/L    Comment: RESULTS CONFIRMED BY MANUAL DILUTION CRITICAL RESULT CALLED TO, READ BACK BY AND  VERIFIED WITH: MICHELLE TOLER,RN AT 9735 02/20/15 BY ZBEECH.   Glucose, capillary     Status: Abnormal   Collection Time: 02/20/15 12:56 PM  Result Value Ref Range   Glucose-Capillary 160 (H) 65 - 99 mg/dL  Glucose, capillary     Status: Abnormal   Collection Time: 02/20/15  1:53 PM  Result Value Ref Range   Glucose-Capillary 146 (H) 65 - 99 mg/dL  Lactic acid, plasma     Status: Abnormal   Collection Time: 02/20/15  3:00 PM  Result Value Ref Range   Lactic Acid, Venous 10.3 (HH) 0.5 - 2.0 mmol/L    Comment: CRITICAL RESULT CALLED TO, READ BACK BY AND VERIFIED WITH: Renette Butters 1712 02/20/2015 WBOND   Glucose, capillary     Status: Abnormal   Collection Time: 02/20/15  3:11 PM  Result Value Ref Range   Glucose-Capillary 147 (H) 65 - 99 mg/dL  Glucose, capillary     Status: Abnormal   Collection Time: 02/20/15  4:16 PM   Result Value Ref Range   Glucose-Capillary 169 (H) 65 - 99 mg/dL  Glucose, capillary     Status: Abnormal   Collection Time: 02/20/15  5:10 PM  Result Value Ref Range   Glucose-Capillary 169 (H) 65 - 99 mg/dL  Renal function panel (daily at 1600)     Status: Abnormal   Collection Time: 02/20/15  5:12 PM  Result Value Ref Range   Sodium 132 (L) 135 - 145 mmol/L   Potassium 4.1 3.5 - 5.1 mmol/L   Chloride 89 (L) 101 - 111 mmol/L   CO2 21 (L) 22 - 32 mmol/L   Glucose, Bld 164 (H) 65 - 99 mg/dL   BUN 38 (H) 6 - 20 mg/dL   Creatinine, Ser 3.58 (H) 0.61 - 1.24 mg/dL   Calcium 6.8 (L) 8.9 - 10.3 mg/dL   Phosphorus 4.7 (H) 2.5 - 4.6 mg/dL   Albumin 2.4 (L) 3.5 - 5.0 g/dL   GFR calc non Af Amer 18 (L) >60 mL/min   GFR calc Af Amer 21 (L) >60 mL/min    Comment: (NOTE) The eGFR has been calculated using the CKD EPI equation. This calculation has not been validated in all clinical situations. eGFR's persistently <60 mL/min signify possible Chronic Kidney Disease.    Anion gap 22 (H) 5 - 15  Troponin I     Status: Abnormal   Collection Time: 02/20/15  5:12 PM  Result Value Ref Range   Troponin I 1.10 (HH) <0.031 ng/mL    Comment:        POSSIBLE MYOCARDIAL ISCHEMIA. SERIAL TESTING RECOMMENDED. CRITICAL VALUE NOTED.  VALUE IS CONSISTENT WITH PREVIOUSLY REPORTED AND CALLED VALUE.   Glucose, capillary     Status: Abnormal   Collection Time: 02/20/15  6:02 PM  Result Value Ref Range   Glucose-Capillary 150 (H) 65 - 99 mg/dL  Lactic acid, plasma     Status: Abnormal   Collection Time: 02/20/15  6:20 PM  Result Value Ref Range   Lactic Acid, Venous 10.3 (HH) 0.5 - 2.0 mmol/L    Comment: CRITICAL RESULT CALLED TO, READ BACK BY AND VERIFIED WITH: G HARDY,RN 2005 02/20/2015 WBOND   Glucose, capillary     Status: Abnormal   Collection Time: 02/20/15  6:51 PM  Result Value Ref Range   Glucose-Capillary 149 (H) 65 - 99 mg/dL  Glucose, capillary     Status: Abnormal   Collection Time:  02/20/15  7:51 PM  Result Value Ref  Range   Glucose-Capillary 160 (H) 65 - 99 mg/dL  Lactic acid, plasma     Status: Abnormal   Collection Time: 02/20/15  8:30 PM  Result Value Ref Range   Lactic Acid, Venous 9.8 (HH) 0.5 - 2.0 mmol/L    Comment: CRITICAL RESULT CALLED TO, READ BACK BY AND VERIFIED WITH: HANCOCK M,RN 02/20/15 2134 WAYK   Glucose, capillary     Status: Abnormal   Collection Time: 02/20/15  8:55 PM  Result Value Ref Range   Glucose-Capillary 160 (H) 65 - 99 mg/dL  Troponin I (q 6hr x 3)     Status: Abnormal   Collection Time: 02/20/15  9:50 PM  Result Value Ref Range   Troponin I 0.97 (HH) <0.031 ng/mL    Comment:        POSSIBLE MYOCARDIAL ISCHEMIA. SERIAL TESTING RECOMMENDED. CRITICAL VALUE NOTED.  VALUE IS CONSISTENT WITH PREVIOUSLY REPORTED AND CALLED VALUE.   Glucose, capillary     Status: Abnormal   Collection Time: 02/20/15  9:56 PM  Result Value Ref Range   Glucose-Capillary 159 (H) 65 - 99 mg/dL  Glucose, capillary     Status: Abnormal   Collection Time: 02/20/15 10:56 PM  Result Value Ref Range   Glucose-Capillary 159 (H) 65 - 99 mg/dL  Glucose, capillary     Status: Abnormal   Collection Time: 02/20/15 11:55 PM  Result Value Ref Range   Glucose-Capillary 180 (H) 65 - 99 mg/dL  Lactic acid, plasma     Status: Abnormal   Collection Time: 02/21/15 12:01 AM  Result Value Ref Range   Lactic Acid, Venous 9.9 (HH) 0.5 - 2.0 mmol/L    Comment: CRITICAL RESULT CALLED TO, READ BACK BY AND VERIFIED WITH: HANCOCK M,RN 02/21/15 0057 WAYK   Glucose, capillary     Status: Abnormal   Collection Time: 02/21/15  1:03 AM  Result Value Ref Range   Glucose-Capillary 190 (H) 65 - 99 mg/dL  Glucose, capillary     Status: Abnormal   Collection Time: 02/21/15  2:02 AM  Result Value Ref Range   Glucose-Capillary 182 (H) 65 - 99 mg/dL   Comment 1 Notify RN   Lactic acid, plasma     Status: Abnormal   Collection Time: 02/21/15  3:00 AM  Result Value Ref Range    Lactic Acid, Venous 10.1 (HH) 0.5 - 2.0 mmol/L    Comment: CRITICAL RESULT CALLED TO, READ BACK BY AND VERIFIED WITH: HANCOCK M,RN 02/21/15 0347 WAYK   Glucose, capillary     Status: Abnormal   Collection Time: 02/21/15  3:00 AM  Result Value Ref Range   Glucose-Capillary 174 (H) 65 - 99 mg/dL  Glucose, capillary     Status: Abnormal   Collection Time: 02/21/15  3:59 AM  Result Value Ref Range   Glucose-Capillary 169 (H) 65 - 99 mg/dL  Renal function panel (daily at 0500)     Status: Abnormal   Collection Time: 02/21/15  4:15 AM  Result Value Ref Range   Sodium 133 (L) 135 - 145 mmol/L   Potassium 4.1 3.5 - 5.1 mmol/L   Chloride 89 (L) 101 - 111 mmol/L   CO2 23 22 - 32 mmol/L   Glucose, Bld 173 (H) 65 - 99 mg/dL   BUN 46 (H) 6 - 20 mg/dL   Creatinine, Ser 3.65 (H) 0.61 - 1.24 mg/dL   Calcium 6.9 (L) 8.9 - 10.3 mg/dL   Phosphorus 3.6 2.5 - 4.6 mg/dL   Albumin 2.2 (  L) 3.5 - 5.0 g/dL   GFR calc non Af Amer 18 (L) >60 mL/min   GFR calc Af Amer 20 (L) >60 mL/min    Comment: (NOTE) The eGFR has been calculated using the CKD EPI equation. This calculation has not been validated in all clinical situations. eGFR's persistently <60 mL/min signify possible Chronic Kidney Disease.    Anion gap 21 (H) 5 - 15  Magnesium     Status: None   Collection Time: 02/21/15  4:15 AM  Result Value Ref Range   Magnesium 2.3 1.7 - 2.4 mg/dL  Procalcitonin     Status: None   Collection Time: 02/21/15  4:15 AM  Result Value Ref Range   Procalcitonin 3.97 ng/mL    Comment:        Interpretation: PCT > 2 ng/mL: Systemic infection (sepsis) is likely, unless other causes are known. (NOTE)         ICU PCT Algorithm               Non ICU PCT Algorithm    ----------------------------     ------------------------------         PCT < 0.25 ng/mL                 PCT < 0.1 ng/mL     Stopping of antibiotics            Stopping of antibiotics       strongly encouraged.               strongly encouraged.     ----------------------------     ------------------------------       PCT level decrease by               PCT < 0.25 ng/mL       >= 80% from peak PCT       OR PCT 0.25 - 0.5 ng/mL          Stopping of antibiotics                                             encouraged.     Stopping of antibiotics           encouraged.    ----------------------------     ------------------------------       PCT level decrease by              PCT >= 0.25 ng/mL       < 80% from peak PCT        AND PCT >= 0.5 ng/mL            Continuing antibiotics                                               encouraged.       Continuing antibiotics            encouraged.    ----------------------------     ------------------------------     PCT level increase compared          PCT > 0.5 ng/mL         with peak PCT AND          PCT >= 0.5 ng/mL  Escalation of antibiotics                                          strongly encouraged.      Escalation of antibiotics        strongly encouraged.   CBC     Status: Abnormal   Collection Time: 02/21/15  4:20 AM  Result Value Ref Range   WBC 5.4 4.0 - 10.5 K/uL   RBC 3.21 (L) 4.22 - 5.81 MIL/uL   Hemoglobin 8.7 (L) 13.0 - 17.0 g/dL   HCT 25.3 (L) 39.0 - 52.0 %   MCV 78.8 78.0 - 100.0 fL   MCH 27.1 26.0 - 34.0 pg   MCHC 34.4 30.0 - 36.0 g/dL   RDW 14.0 11.5 - 15.5 %   Platelets 78 (L) 150 - 400 K/uL    Comment: PLATELET COUNT CONFIRMED BY SMEAR REPEATED TO VERIFY   Glucose, capillary     Status: Abnormal   Collection Time: 02/21/15  4:55 AM  Result Value Ref Range   Glucose-Capillary 133 (H) 65 - 99 mg/dL  Glucose, capillary     Status: Abnormal   Collection Time: 02/21/15  5:59 AM  Result Value Ref Range   Glucose-Capillary 156 (H) 65 - 99 mg/dL  Lactic acid, plasma     Status: Abnormal   Collection Time: 02/21/15  6:07 AM  Result Value Ref Range   Lactic Acid, Venous 8.5 (HH) 0.5 - 2.0 mmol/L    Comment: CRITICAL RESULT CALLED TO, READ BACK BY AND  VERIFIED WITH: [facility] 0623 02/21/15 CLARK,S   Glucose, capillary     Status: Abnormal   Collection Time: 02/21/15  6:48 AM  Result Value Ref Range   Glucose-Capillary 161 (H) 65 - 99 mg/dL  I-STAT 3, arterial blood gas (G3+)     Status: Abnormal   Collection Time: 02/21/15  7:46 AM  Result Value Ref Range   pH, Arterial 7.483 (H) 7.350 - 7.450   pCO2 arterial 37.5 35.0 - 45.0 mmHg   pO2, Arterial 141.0 (H) 80.0 - 100.0 mmHg   Bicarbonate 28.0 (H) 20.0 - 24.0 mEq/L   TCO2 29 0 - 100 mmol/L   O2 Saturation 99.0 %   Acid-Base Excess 4.0 (H) 0.0 - 2.0 mmol/L   Patient temperature 99.3 F    Sample type ARTERIAL   Lactic acid, plasma     Status: Abnormal   Collection Time: 02/21/15  8:00 AM  Result Value Ref Range   Lactic Acid, Venous 6.9 (HH) 0.5 - 2.0 mmol/L    Comment: CRITICAL RESULT CALLED TO, READ BACK BY AND VERIFIED WITH: TOLER,M RN @ 0940 02/21/15 LEONARD,A   Hepatic function panel     Status: Abnormal   Collection Time: 02/21/15  8:00 AM  Result Value Ref Range   Total Protein 4.7 (L) 6.5 - 8.1 g/dL   Albumin 2.1 (L) 3.5 - 5.0 g/dL   AST 35 15 - 41 U/L   ALT 33 17 - 63 U/L   Alkaline Phosphatase 39 38 - 126 U/L   Total Bilirubin 0.8 0.3 - 1.2 mg/dL   Bilirubin, Direct 0.2 0.1 - 0.5 mg/dL   Indirect Bilirubin 0.6 0.3 - 0.9 mg/dL    Current Facility-Administered Medications  Medication Dose Route Frequency Provider Last Rate Last Dose  . 0.9 %  sodium chloride infusion  250 mL Intravenous PRN Marjory Lies  Ancil Linsey, MD 10 mL/hr at 02/21/15 1000 250 mL at 02/21/15 1000  . 0.9 %  sodium chloride infusion  250 mL Intravenous PRN Aquilla Hacker, MD      . acetaminophen (TYLENOL) solution 650 mg  650 mg Per Tube Q6H PRN Anders Simmonds, MD   650 mg at 02/20/15 0438  . albuterol (PROVENTIL) (2.5 MG/3ML) 0.083% nebulizer solution 2.5 mg  2.5 mg Nebulization Q2H PRN Tammy S Parrett, NP      . antiseptic oral rinse solution (CORINZ)  7 mL Mouth Rinse QID Tammy S Parrett, NP   7 mL at  02/21/15 0302  . chlorhexidine gluconate (PERIDEX) 0.12 % solution 15 mL  15 mL Mouth Rinse BID Tammy S Parrett, NP   15 mL at 02/21/15 0725  . fentaNYL (SUBLIMAZE) 2,500 mcg in sodium chloride 0.9 % 250 mL (10 mcg/mL) infusion  25-400 mcg/hr Intravenous Continuous Melvenia Needles, NP   Stopped at 02/21/15 0930  . fentaNYL (SUBLIMAZE) bolus via infusion 50 mcg  50 mcg Intravenous Q1H PRN Tammy S Parrett, NP      . heparinized saline (2000 units/L) primer fluid for CRRT   CRRT PRN Elmarie Shiley, MD      . hydrocortisone sodium succinate (SOLU-CORTEF) 100 MG injection 50 mg  50 mg Intravenous Q6H Brand Males, MD   50 mg at 02/21/15 0725  . insulin regular (NOVOLIN R,HUMULIN R) 250 Units in sodium chloride 0.9 % 250 mL (1 Units/mL) infusion   Intravenous Continuous Anders Simmonds, MD 3.6 mL/hr at 02/21/15 1044 3.6 Units/hr at 02/21/15 1044  . midazolam (VERSED) injection 2 mg  2 mg Intravenous Q15 min PRN Tammy S Parrett, NP      . midazolam (VERSED) injection 2 mg  2 mg Intravenous Q2H PRN Melvenia Needles, NP   1 mg at 02/18/15 1834  . norepinephrine (LEVOPHED) 16 mg in dextrose 5 % 250 mL (0.064 mg/mL) infusion  0-70 mcg/min Intravenous Titrated Anders Simmonds, MD 5.6 mL/hr at 02/21/15 0900 5.973 mcg/min at 02/21/15 0900  . pantoprazole (PROTONIX) injection 40 mg  40 mg Intravenous Daily Tammy S Parrett, NP   40 mg at 02/20/15 1001  . phenylephrine (NEO-SYNEPHRINE) 40 mg in dextrose 5 % 250 mL (0.16 mg/mL) infusion  0-400 mcg/min Intravenous Titrated Rush Farmer, MD   Stopped at 02/21/15 0405  . piperacillin-tazobactam (ZOSYN) IVPB 3.375 g  3.375 g Intravenous 4 times per day Rush Farmer, MD   3.375 g at 02/21/15 0505  . prismasol BGK 4/2.5 5,000 mL dialysis replacement fluid   CRRT Continuous Rush Farmer, MD 600 mL/hr at 02/21/15 0456    . prismasol BGK 4/2.5 5,000 mL dialysis solution   CRRT Continuous Elmarie Shiley, MD 1,500 mL/hr at 02/21/15 [phone]    . sodium bicarbonate 150 mEq in sterile  water 1,000 mL infusion   CRRT Continuous Fleet Contras, MD 100 mL/hr at 02/21/15 0931    . sodium chloride 0.9 % primer fluid for CRRT   CRRT PRN Elmarie Shiley, MD      . sodium chloride flush (NS) 0.9 % injection 3 mL  3 mL Intravenous Q12H Aquilla Hacker, MD   3 mL at 02/20/15 2135  . sodium chloride flush (NS) 0.9 % injection 3 mL  3 mL Intravenous PRN Aquilla Hacker, MD      . vancomycin (VANCOCIN) 1,250 mg in sodium chloride 0.9 % 250 mL IVPB  1,250 mg Intravenous Q24H James L  Ledford, RPH   1,250 mg at 02/21/15 0600  . vasopressin (PITRESSIN) 40 Units in sodium chloride 0.9 % 250 mL (0.16 Units/mL) infusion  0.03 Units/min Intravenous Continuous Melvenia Needles, NP   Stopped at 02/21/15 0630    Musculoskeletal: Strength & Muscle Tone: decreased Gait & Station: unable to stand Patient leans: N/A  Psychiatric Specialty Exam: ROS depression, suicidal ideation and status post suicidal attempt. Patient denies nausea, vomiting, diarrhea, shortness of breath and chest pain No Fever-chills, No Headache, No changes with Vision or hearing, reports vertigo No problems swallowing food or Liquids, No Chest pain, Cough or Shortness of Breath, No Abdominal pain, No Nausea or Vommitting, Bowel movements are regular, No Blood in stool or Urine, No dysuria, No new skin rashes or bruises, No new joints pains-aches,  No new weakness, tingling, numbness in any extremity, No recent weight gain or loss, No polyuria, polydypsia or polyphagia,   A full 10 point Review of Systems was done, except as stated above, all other Review of Systems were negative.  Blood pressure 113/55, pulse 94, temperature 99.3 F (37.4 C), temperature source Oral, resp. rate 20, height '5\' 9"'$  (1.753 m), weight 116.1 kg (255 lb 15.3 oz), SpO2 98 %.Body mass index is 37.78 kg/(m^2).  General Appearance: Guarded  Eye Contact::  Fair  Speech:  Clear and Coherent  Volume:  Decreased  Mood:  Anxious and Depressed  Affect:   Constricted and Depressed  Thought Process:  Coherent and Goal Directed  Orientation:  Full (Time, Place, and Person)  Thought Content:  Rumination  Suicidal Thoughts:  Yes.  with intent/plan  Homicidal Thoughts:  No  Memory:  Immediate;   Fair Recent;   Fair  Judgement:  Impaired  Insight:  Fair  Psychomotor Activity:  Decreased  Concentration:  Fair  Recall:  [employer] of Knowledge:Good  Language: Good  Akathisia:  Negative  Handed:  Right  AIMS (if indicated):     Assets:  Communication Skills Desire for Improvement Housing Leisure Time Resilience Social Support Transportation  ADL's:  Impaired  Cognition: WNL  Sleep:      Treatment Plan Summary: Daily contact with patient to assess and evaluate symptoms and progress in treatment and Medication management  Safety concern: Chief Technology Officer as patient cannot contract for safety Referred to the unit social worker for appropriate psychiatric inpatient placement when medically stable. Recommended no new psychotropic medication Appreciate psychiatric consultation and follow up as clinically required Please contact [phone] or [phone] if needs further assistance  Disposition: Recommend psychiatric Inpatient admission when medically cleared. Supportive therapy provided about ongoing stressors.  Durward Parcel., MD 02/21/2015 11:08 AM

## 2015-02-21 NOTE — Progress Notes (Signed)
UR Completed. Mac Dowdell, RN, BSN.  [phone] 

## 2015-02-21 NOTE — Progress Notes (Signed)
100cc of Fentanyl wasted in the sink with Aldona Lento, RN.

## 2015-02-22 ENCOUNTER — Inpatient Hospital Stay (HOSPITAL_COMMUNITY): Payer: Self-pay

## 2015-02-22 DIAGNOSIS — L8990 Pressure ulcer of unspecified site, unspecified stage: Secondary | ICD-10-CM | POA: Insufficient documentation

## 2015-02-22 DIAGNOSIS — F3130 Bipolar disorder, current episode depressed, mild or moderate severity, unspecified: Secondary | ICD-10-CM

## 2015-02-22 LAB — SEROTONIN RELEASE ASSAY (SRA)
SRA .2 IU/mL UFH Ser-aCnc: 61 % — ABNORMAL HIGH (ref 0–20)
SRA 100IU/mL UFH Ser-aCnc: 10 % (ref 0–20)

## 2015-02-22 LAB — CBC
HCT: 27.1 % — ABNORMAL LOW (ref 39.0–52.0)
Hemoglobin: 9.2 g/dL — ABNORMAL LOW (ref 13.0–17.0)
MCH: 26.7 pg (ref 26.0–34.0)
MCHC: 33.9 g/dL (ref 30.0–36.0)
MCV: 78.8 fL (ref 78.0–100.0)
Platelets: 89 10*3/uL — ABNORMAL LOW (ref 150–400)
RBC: 3.44 MIL/uL — ABNORMAL LOW (ref 4.22–5.81)
RDW: 13.8 % (ref 11.5–15.5)
WBC: 7.8 10*3/uL (ref 4.0–10.5)

## 2015-02-22 LAB — RENAL FUNCTION PANEL
Albumin: 2.1 g/dL — ABNORMAL LOW (ref 3.5–5.0)
Albumin: 2.1 g/dL — ABNORMAL LOW (ref 3.5–5.0)
Anion gap: 15 (ref 5–15)
Anion gap: 16 — ABNORMAL HIGH (ref 5–15)
BUN: 55 mg/dL — ABNORMAL HIGH (ref 6–20)
BUN: 72 mg/dL — ABNORMAL HIGH (ref 6–20)
CO2: 28 mmol/L (ref 22–32)
CO2: 26 mmol/L (ref 22–32)
Calcium: 7.1 mg/dL — ABNORMAL LOW (ref 8.9–10.3)
Calcium: 7.1 mg/dL — ABNORMAL LOW (ref 8.9–10.3)
Chloride: 93 mmol/L — ABNORMAL LOW (ref 101–111)
Chloride: 93 mmol/L — ABNORMAL LOW (ref 101–111)
Creatinine, Ser: 3.73 mg/dL — ABNORMAL HIGH (ref 0.61–1.24)
Creatinine, Ser: 4.83 mg/dL — ABNORMAL HIGH (ref 0.61–1.24)
GFR calc Af Amer: 20 mL/min — ABNORMAL LOW (ref >60)
GFR calc Af Amer: 15 mL/min — ABNORMAL LOW (ref >60)
GFR calc non Af Amer: 17 mL/min — ABNORMAL LOW (ref >60)
GFR calc non Af Amer: 13 mL/min — ABNORMAL LOW (ref >60)
Glucose, Bld: 180 mg/dL — ABNORMAL HIGH (ref 65–99)
Glucose, Bld: 271 mg/dL — ABNORMAL HIGH (ref 65–99)
Phosphorus: 4.3 mg/dL (ref 2.5–4.6)
Phosphorus: 5.2 mg/dL — ABNORMAL HIGH (ref 2.5–4.6)
Potassium: 4 mmol/L (ref 3.5–5.1)
Potassium: 3.7 mmol/L (ref 3.5–5.1)
Sodium: 136 mmol/L (ref 135–145)
Sodium: 135 mmol/L (ref 135–145)

## 2015-02-22 LAB — PROCALCITONIN: Procalcitonin: 3.08 ng/mL

## 2015-02-22 LAB — GLUCOSE, CAPILLARY
Glucose-Capillary: 133 mg/dL — ABNORMAL HIGH (ref 65–99)
Glucose-Capillary: 142 mg/dL — ABNORMAL HIGH (ref 65–99)
Glucose-Capillary: 145 mg/dL — ABNORMAL HIGH (ref 65–99)
Glucose-Capillary: 146 mg/dL — ABNORMAL HIGH (ref 65–99)
Glucose-Capillary: 154 mg/dL — ABNORMAL HIGH (ref 65–99)
Glucose-Capillary: 184 mg/dL — ABNORMAL HIGH (ref 65–99)
Glucose-Capillary: 150 mg/dL — ABNORMAL HIGH (ref 65–99)
Glucose-Capillary: 161 mg/dL — ABNORMAL HIGH (ref 65–99)
Glucose-Capillary: 165 mg/dL — ABNORMAL HIGH (ref 65–99)
Glucose-Capillary: 152 mg/dL — ABNORMAL HIGH (ref 65–99)
Glucose-Capillary: 177 mg/dL — ABNORMAL HIGH (ref 65–99)
Glucose-Capillary: 177 mg/dL — ABNORMAL HIGH (ref 65–99)
Glucose-Capillary: 237 mg/dL — ABNORMAL HIGH (ref 65–99)
Glucose-Capillary: 264 mg/dL — ABNORMAL HIGH (ref 65–99)

## 2015-02-22 LAB — MAGNESIUM: Magnesium: 2.7 mg/dL — ABNORMAL HIGH (ref 1.7–2.4)

## 2015-02-22 MED ORDER — QUETIAPINE FUMARATE 25 MG PO TABS
25.0000 mg | ORAL_TABLET | Freq: Once | ORAL | Status: AC
  Administered 2015-02-22: 25 mg via ORAL
  Filled 2015-02-22: qty 1

## 2015-02-22 MED ORDER — ONDANSETRON HCL 4 MG/2ML IJ SOLN
4.0000 mg | Freq: Four times a day (QID) | INTRAMUSCULAR | Status: DC | PRN
  Administered 2015-02-22: 4 mg via INTRAVENOUS
  Filled 2015-02-22: qty 2

## 2015-02-22 MED ORDER — INSULIN ASPART 100 UNIT/ML ~~LOC~~ SOLN
0.0000 [IU] | Freq: Three times a day (TID) | SUBCUTANEOUS | Status: DC
  Administered 2015-02-23 – 2015-02-24 (×3): 5 [IU] via SUBCUTANEOUS
  Administered 2015-02-24: 7 [IU] via SUBCUTANEOUS
  Administered 2015-02-24 – 2015-02-25 (×2): 5 [IU] via SUBCUTANEOUS
  Administered 2015-02-25: 3 [IU] via SUBCUTANEOUS
  Administered 2015-02-25: 5 [IU] via SUBCUTANEOUS
  Administered 2015-02-26: 3 [IU] via SUBCUTANEOUS
  Administered 2015-02-26 (×2): 2 [IU] via SUBCUTANEOUS
  Administered 2015-02-27: 5 [IU] via SUBCUTANEOUS
  Administered 2015-02-27: 1 [IU] via SUBCUTANEOUS
  Administered 2015-02-28: 2 [IU] via SUBCUTANEOUS
  Administered 2015-02-28: 5 [IU] via SUBCUTANEOUS
  Administered 2015-02-28: 3 [IU] via SUBCUTANEOUS
  Administered 2015-03-01: 2 [IU] via SUBCUTANEOUS
  Administered 2015-03-01: 7 [IU] via SUBCUTANEOUS
  Administered 2015-03-01: 3 [IU] via SUBCUTANEOUS
  Administered 2015-03-02: 5 [IU] via SUBCUTANEOUS
  Administered 2015-03-02: 7 [IU] via SUBCUTANEOUS
  Administered 2015-03-02: 2 [IU] via SUBCUTANEOUS
  Administered 2015-03-03: 3 [IU] via SUBCUTANEOUS
  Administered 2015-03-03 – 2015-03-04 (×4): 5 [IU] via SUBCUTANEOUS
  Administered 2015-03-05: 2 [IU] via SUBCUTANEOUS
  Administered 2015-03-05: 5 [IU] via SUBCUTANEOUS
  Administered 2015-03-05: 7 [IU] via SUBCUTANEOUS
  Administered 2015-03-06: 1 [IU] via SUBCUTANEOUS
  Administered 2015-03-06 – 2015-03-07 (×2): 3 [IU] via SUBCUTANEOUS
  Administered 2015-03-07: 5 [IU] via SUBCUTANEOUS
  Administered 2015-03-08 (×2): 3 [IU] via SUBCUTANEOUS
  Administered 2015-03-09: 2 [IU] via SUBCUTANEOUS
  Administered 2015-03-09: 3 [IU] via SUBCUTANEOUS
  Administered 2015-03-10 (×2): 2 [IU] via SUBCUTANEOUS
  Administered 2015-03-10: 1 [IU] via SUBCUTANEOUS
  Administered 2015-03-11: 2 [IU] via SUBCUTANEOUS
  Administered 2015-03-11: 1 [IU] via SUBCUTANEOUS
  Administered 2015-03-11: 2 [IU] via SUBCUTANEOUS
  Administered 2015-03-12: 3 [IU] via SUBCUTANEOUS
  Administered 2015-03-12 (×2): 1 [IU] via SUBCUTANEOUS

## 2015-02-22 MED ORDER — ANTICOAGULANT SODIUM CITRATE 4% (200MG/5ML) IV SOLN
5.0000 mL | Freq: Once | Status: AC
  Administered 2015-02-22: 2.8 mL via INTRAVENOUS
  Filled 2015-02-22: qty 250

## 2015-02-22 MED ORDER — INFLUENZA VAC SPLIT QUAD 0.5 ML IM SUSY
0.5000 mL | PREFILLED_SYRINGE | INTRAMUSCULAR | Status: AC
  Administered 2015-02-23: 0.5 mL via INTRAMUSCULAR
  Filled 2015-02-22: qty 0.5

## 2015-02-22 MED ORDER — DEXTROSE 10 % IV SOLN: INTRAVENOUS | Status: DC | PRN

## 2015-02-22 MED ORDER — GABAPENTIN 600 MG PO TABS
600.0000 mg | ORAL_TABLET | Freq: Three times a day (TID) | ORAL | Status: DC
  Administered 2015-02-22: 600 mg via ORAL
  Filled 2015-02-22: qty 1

## 2015-02-22 MED ORDER — AMOXICILLIN-POT CLAVULANATE 250-125 MG PO TABS
1.0000 | ORAL_TABLET | ORAL | Status: DC
  Administered 2015-02-22: 1 via ORAL
  Filled 2015-02-22 (×2): qty 1

## 2015-02-22 MED ORDER — INSULIN GLARGINE 100 UNIT/ML ~~LOC~~ SOLN
15.0000 [IU] | Freq: Every day | SUBCUTANEOUS | Status: DC
  Administered 2015-02-22 (×2): 15 [IU] via SUBCUTANEOUS
  Filled 2015-02-22 (×2): qty 0.15

## 2015-02-22 MED ORDER — ANTICOAGULANT SODIUM CITRATE 4% (200MG/5ML) IV SOLN
5.0000 mL | Freq: Once | Status: AC
  Administered 2015-02-27: 5 mL via INTRAVENOUS
  Filled 2015-02-22 (×3): qty 250

## 2015-02-22 MED ORDER — INSULIN ASPART 100 UNIT/ML ~~LOC~~ SOLN
1.0000 [IU] | Freq: Three times a day (TID) | SUBCUTANEOUS | Status: DC
  Administered 2015-02-22: 3 [IU] via SUBCUTANEOUS

## 2015-02-22 MED ORDER — INSULIN ASPART 100 UNIT/ML ~~LOC~~ SOLN
0.0000 [IU] | Freq: Every day | SUBCUTANEOUS | Status: DC
  Administered 2015-02-22: 3 [IU] via SUBCUTANEOUS
  Administered 2015-02-23: 2 [IU] via SUBCUTANEOUS
  Administered 2015-02-24 – 2015-02-25 (×2): 3 [IU] via SUBCUTANEOUS
  Administered 2015-02-26: 2 [IU] via SUBCUTANEOUS
  Administered 2015-02-27: 3 [IU] via SUBCUTANEOUS
  Administered 2015-02-28: 2 [IU] via SUBCUTANEOUS
  Administered 2015-03-01 – 2015-03-02 (×2): 3 [IU] via SUBCUTANEOUS
  Administered 2015-03-04: 2 [IU] via SUBCUTANEOUS
  Administered 2015-03-04: 3 [IU] via SUBCUTANEOUS
  Administered 2015-03-05 – 2015-03-06 (×2): 4 [IU] via SUBCUTANEOUS
  Administered 2015-03-08: 2 [IU] via SUBCUTANEOUS
  Administered 2015-03-10 – 2015-03-11 (×2): 3 [IU] via SUBCUTANEOUS
  Administered 2015-03-12: 2 [IU] via SUBCUTANEOUS

## 2015-02-22 MED ORDER — INSULIN ASPART 100 UNIT/ML ~~LOC~~ SOLN
1.0000 [IU] | SUBCUTANEOUS | Status: DC
  Administered 2015-02-22 (×3): 2 [IU] via SUBCUTANEOUS

## 2015-02-22 MED ORDER — PNEUMOCOCCAL VAC POLYVALENT 25 MCG/0.5ML IJ INJ
0.5000 mL | INJECTION | INTRAMUSCULAR | Status: AC
  Administered 2015-02-23: 0.5 mL via INTRAMUSCULAR
  Filled 2015-02-22: qty 0.5

## 2015-02-22 NOTE — Progress Notes (Signed)
Attempted to place dialysis cath in rt IJ. Pt could not breath well or lie still in trendelenburg position Procedure aborted after a few needle sticks. Guide wire or dilators were not used.   Continue femoral HD cath for now. Can approach IR tomorrow if needed. CXR ordered  Marshell Garfinkel MD [facility] Pager [phone] If no answer or after 3pm call: [phone] 02/22/2015, 4:46 PM

## 2015-02-22 NOTE — Progress Notes (Signed)
PULMONARY / CRITICAL CARE MEDICINE HISTORY AND PHYSICAL EXAMINATION   Name: [name] [name] MRN: [id number] DOB: 04/14/1960    ADMISSION DATE:  02/18/2015  PRIMARY SERVICE: PCCM  CHIEF COMPLAINT:  Suicide attempt  BRIEF PATIENT DESCRIPTION: 55 y/o man with bipolar d/o who attempted suicide with polysubstance overdose and slit wrist  SIGNIFICANT EVENTS / STUDIES:  LA > 15 ARF L wrist stitched up in ED. 2/5- Started on CVVH. 2/7 CXR > persistent LLL atalectasis vs. Infiltrate.  2/8 > resolving acidosis, continued oliguric renal failure. Extubated 2/9 > remains extubated, off pressors.   LINES / TUBES: PIV x2 Art Line 2/5 > 2/8 HD Cath > 2/6  CULTURES: 2/6 Blood Cx x2 > NG x 1 d 2/7 Resp Culture > GPC, GPR, GNR  ANTIBIOTICS: Vancomycin 2/7 > Zosyn 2/7 >  HISTORY OF PRESENT ILLNESS:   [name] [name] is a 55 y/o man with bipolar d/o and prior history of suicide ideations, but no apparent prior attempts who was found at home by his mother with slit wrist and empty pill bottles (Linsinopril, gabapentin, and quetiapine). He also probably took some metformin, but it is not clear how much he took. He has had worsening depression and then tonight before bed the patient mentioned to his mother that if he "ever needed to go life support" that he wouldn't want it. During the night she found him lying in the floor of his room with blood from a slit wrist and called EMS. He was also altered, but able to speak (he asked her to not call EMS). EMS found empty pill bottles although the metformin bottle was not empty, it is not known if or how much of that medication he took. In the ED he was found to be severely acidotic with profound metabolic acidosis.  PAST MEDICAL HISTORY :  Past Medical History  Diagnosis Date  . Depressed bipolar disorder ([place])   . Hypertension   . Diabetes mellitus without complication ([place])   . Sleep apnea   . Diabetic neuropathy [facility])    Past Surgical History   Procedure Laterality Date  . Spine surgery  2010  . Shoulder arthroscopy with rotator cuff repair Right 2009   Prior to Admission medications   Medication Sig Start Date End Date Taking? Authorizing Provider  buPROPion (WELLBUTRIN SR) 150 MG 12 hr tablet Take 150 mg by mouth 2 (two) times daily.    Historical Provider, MD  gabapentin ([name]) 600 MG tablet TAKE 1 TABLET MOUTH 3 TIMES DAY Patient taking differently: Take 600 mg by mouth as needed for diabetic neuropathy. 10/03/14   Lance Bosch, NP  lisinopril-hydrochlorothiazide (PRINZIDE,ZESTORETIC) 20-12.5 MG tablet Take 1 tablet by mouth daily. 10/17/14   Lance Bosch, NP  metFORMIN (GLUCOPHAGE) 500 MG tablet Take 1 tablet (500 mg total) by mouth 2 (two) times daily with a meal. 10/17/14   Lance Bosch, NP  methadone (DOLOPHINE) 10 MG/ML solution Take 70 mg by mouth daily.     Historical Provider, MD  neomycin-bacitracin-polymyxin (NEOSPORIN) ointment Apply 1 application topically every 12 (twelve) hours. apply to eye    Historical Provider, MD  omeprazole (PRILOSEC) 20 MG capsule Take 1 capsule (20 mg total) by mouth 2 (two) times daily before a meal. Patient taking differently: Take 20 mg by mouth daily as needed (indigestion/acid reflux).  01/01/14   Barton Dubois, MD  QUEtiapine (SEROQUEL XR) 300 MG 24 hr tablet Take 300 mg by mouth at bedtime.    Historical Provider,  MD   Allergies  Allergen Reactions  . Heparin   . Oxytetracycline Rash  . Sulfonamide Derivatives Rash    FAMILY HISTORY:  Family History  Problem Relation Age of Onset  . Diabetes Mother   . Hyperlipidemia Mother   . Heart disease Father    SOCIAL HISTORY:  reports that he has never smoked. He does not have any smokeless tobacco history on file. He reports that he does not drink alcohol or use illicit drugs.  REVIEW OF SYSTEMS:  Unable to obtain 2/2 AMS.  SUBJECTIVE:  Pt. With continued improving acidosis. Clotting overnight with CRRT. Did not  tolerate PO yesterday, but feeling up to it today. Is resistant to getting psych treatment. Some mild nausea overnight.    VITAL SIGNS: Temp:  [98.2 F (36.8 C)-99.3 F (37.4 C)] 98.6 F (37 C) (02/09 0600) Pulse Rate:  [77-99] 87 (02/09 0600) Resp:  [10-29] 17 (02/09 0600) BP: (111-133)/(55-105) 131/72 mmHg (02/09 0600) SpO2:  [90 %-100 %] 95 % (02/09 0600) Arterial Line BP: (98-162)/(43-73) 155/71 mmHg (02/09 0600) FiO2 (%):  [40 %] 40 % (02/08 0800) Weight:  [252 lb 6.8 oz (114.5 kg)] 252 lb 6.8 oz (114.5 kg) (02/09 0330) HEMODYNAMICS:   VENTILATOR SETTINGS: Vent Mode:  [-] PSV;CPAP FiO2 (%):  [40 %] 40 % PEEP:  [5 cmH20] 5 cmH20 INTAKE / OUTPUT: Intake/Output      02/08 0701 - 02/09 0700 02/09 0701 - 02/10 0700   I.V. (mL/kg) 593.6 (5.2)    Other 30    NG/GT 120    IV Piggyback 150    Total Intake(mL/kg) 893.6 (7.8)    Urine (mL/kg/hr) 92 (0)    Other 1017 (0.4)    Stool 0 (0)    Total Output 1109     Net -215.4          Stool Occurrence 1 x      PHYSICAL EXAMINATION: General: No distress, AAOx3 Neuro:  PERRL, No focal deficits.  HEENT:  Supple, no LAD Cardiovascular:  RRR. No MRG, 2+ distal pulses.  Lungs:  Clear, No crackles, good air movement BL.  Abdomen:  Soft, + BS, NT Musculoskeletal:  No swollen joints, nontender Skin:  No rashes  LABS:  CBC  Recent Labs Lab 02/20/15 0509 02/21/15 0420 02/22/15 0340  WBC 10.4 5.4 7.8  HGB 9.7* 8.7* 9.2*  HCT 29.2* 25.3* 27.1*  PLT 120* 78* 89*   Coag's  Recent Labs Lab 02/18/15 2224 02/19/15 0430 02/19/15 0733 02/19/15 1230  APTT  --  >200* >200* 97*  INR 1.98* 1.86*  --   --    BMET  Recent Labs Lab 02/21/15 0415 02/21/15 1547 02/22/15 0340  NA 133* 136 136  K 4.1 4.1 4.0  CL 89* 90* 93*  CO2 23 31 28   BUN 46* 54* 55*  CREATININE 3.65* 3.52* 3.73*  GLUCOSE 173* 139* 180*   Electrolytes  Recent Labs Lab 02/20/15 0509  02/21/15 0415 02/21/15 1547 02/22/15 0340  CALCIUM 7.1*  <  > 6.9* 7.1* 7.1*  MG 1.9  --  2.3  --  2.7*  PHOS 3.6  < > 3.6 4.5 4.3  < > = values in this interval not displayed. Sepsis Markers  Recent Labs Lab 02/20/15 0903  02/21/15 0415  02/21/15 1157 02/21/15 1552 02/21/15 1832 02/22/15 0340  LATICACIDVEN  --   < >  --   < > 4.3* 2.5* 1.9  --   PROCALCITON 5.47  --  3.97  --   --   --   --  3.08  < > = values in this interval not displayed. ABG  Recent Labs Lab 02/19/15 0310 02/20/15 0235 02/21/15 0746  PHART 7.267* 7.444 7.483*  PCO2ART 20.2* 25.9* 37.5  PO2ART 130* 114* 141.0*   Liver Enzymes  Recent Labs Lab 02/19/15 0430  02/20/15 1211  02/21/15 0800 02/21/15 1547 02/22/15 0340  AST 48*  --  54*  --  35  --   --   ALT <5*  --  37  --  33  --   --   ALKPHOS 43  --  44  --  39  --   --   BILITOT 0.9  --  0.6  --  0.8  --   --   ALBUMIN 2.8*  < > 2.4*  < > 2.1* 2.1* 2.1*  < > = values in this interval not displayed. Cardiac Enzymes  Recent Labs Lab 02/20/15 1211 02/20/15 1712 02/20/15 2150  TROPONINI 1.34* 1.10* 0.97*   Glucose  Recent Labs Lab 02/21/15 2331 02/22/15 0025 02/22/15 0123 02/22/15 0225 02/22/15 0325 02/22/15 0420  GLUCAP 146* 184* 161* 150* 165* 152*    Imaging Ct Abdomen Pelvis Wo Contrast  02/20/2015  CLINICAL DATA:  Distended abdomen pain EXAM: CT ABDOMEN AND PELVIS WITHOUT CONTRAST TECHNIQUE: Multidetector CT imaging of the abdomen and pelvis was performed following the standard protocol without IV contrast. COMPARISON:  None. FINDINGS: Lung bases demonstrate right lower lobe consolidation and small effusion. On image number 1 of series 201, there is suggestion of a focal mass lesion. This may represent rounded pneumonia. Left basilar atelectasis is noted as well. The liver, gallbladder, spleen, adrenal glands and pancreas are within normal limits. A nasogastric catheter is noted within the stomach. The kidneys demonstrate no renal calculi or obstructive changes. The appendix is not well  visualized although no inflammatory changes to suggest appendicitis are seen. The bladder is decompressed by Foley catheter. No pelvic mass lesion is noted. Arterial and venous catheters are noted in the left inguinal region. No acute bony abnormality is noted. IMPRESSION: No explanation for the patient's distended abdomen is noted. Changes in the bases bilaterally. There is some suggestion of a masslike density within the right lower lobe. Follow-up CT of the chest when the patient's condition improves is recommended. Imaging at this point would not be helpful given the consolidation and effusion noted. Electronically Signed   By: Inez Catalina M.D.   On: 02/20/2015 15:43   Dg Chest Port 1 View  02/21/2015  CLINICAL DATA:  Acute respiratory failure, intubated patient, overdose, acute renal failure. EXAM: PORTABLE CHEST 1 VIEW COMPARISON:  Portable chest x-ray of February 20, 2015 FINDINGS: The lungs are better inflated today. Subsegmental atelectasis in the right lower lung persists. The left lung is clear. The heart and pulmonary vascularity are normal. The endotracheal tube tip lies 5.5 cm above the carina. The esophagogastric tube tip projects below the inferior margin of the image. The bony structures exhibit no acute abnormalities. IMPRESSION: Persistent subsegmental atelectasis at the right lung base. Overall improved aeration of the lungs. The support tubes are in reasonable position. Electronically Signed   By: David  Martinique M.D.   On: 02/21/2015 07:09    EKG: NSR, Mild QTc prolongation CXR 2/6: No infiltrates.  ASSESSMENT / PLAN:  Principal Problem:   Bipolar I disorder, most recent episode depressed ([place]) Active Problems:   Overdose   Acute respiratory failure with hypoxia ([facility])   Arterial hypotension   AKI (acute kidney injury) ([place])  Acute renal failure (ARF) ([facility])   Lactic acidosis   Shock circulatory ([facility])   Metabolic acidosis   Respiratory failure requiring intubation ([facility])    Pressure ulcer   PULMONARY A: Respiratory Alkalosis compensating for metabolic acidosis - Resolved Extubated P:   Extubated yesterday.  Has been on [employer] ABX since 2/7, PCT dropping. Narrow antibiotics today.  CT Chest  CARDIOVASCULAR A: Hypotension- Likely secondary to drug OD, acidosis. - resolved Lisinopril o/d P:   Weaned off pressors Solucortef to 50 BID then off tomorrow.  Echo with preserved EF and mild DD.  Art line out today.   RENAL A: Acute oliguric renal failure Severe lactic acidosis > resolved P:   Clotting with CVVH overnight > to IHD today.  Poor urine output. Hopeful for recovery but will see.  Appreciate renal input.  Monitoring Lytes. Ca corrects normal.   GASTROINTESTINAL A: No acute issues P:   Stress ulcer ppx LFT's normal.   HEMATOLOGIC A: Slit wrist Elevated PTT Anemia Thrombocytopenia - Concern for HITT P:   Platelets stabilizing today.  HITT ab negative, SRA pending Stopped all Heparin on 2/7.  Continue to monitor coags. Worsening anemia - Txf < hgb 7.  Continue to monitor. Hgb stabilizing.   INFECTIOUS A: Febrile with Increasing Lactic Acid - resolving Leukocytosis - resolving P:   Blood cx x 2 - NG day 1.  Respiratory cx - polymicrobial.  CXR with RLL infiltrate Started on Vanc / Zosyn 2/7 > Deescalate today.  PCT algorithm, 5.47 > 3.97 > 3.08 CT Chest to further evaluate RLL mass vs. Infiltrate.   ENDOCRINE A: No active issues P:   Monitor CBG  Psych/NEUROLOGIC A: Suicide attempt AMS Polysubstance o/d P:   - Seroquel, gabapentin should clear with supportive care. - On CVVH. > IHD today.  - Discussed with poison control. Discontinued Fomepazole.  Continue to monitor cardiac / renal function.  - Psych consult appreciate recommendations. Needs inpatient treatment likely.    BEST PRACTICE / DISPOSITION Level of Care:  ICU Primary Service:  PCCM Consultants:  Nephro, Psych Code Status:   Full Diet:  NPO DVT Px:  SCD's. GI Px:  None Skin Integrity:  Intact Social / Family:  Updated  Note: Code status discussions noted from yesterday. Although the pt stated that he did not want to be on life support. But this was expressed when he was depressed. We will continue to heep him full code for now.   TODAY'S SUMMARY: 55 y/o man with bipolar d/o presenting with polysubstance o/d and severe lactic acidosis concerning for metformin overdose now improving with renal replacement therapy and ICU support.   Paula Compton, MD Family Medicine - PGY 2

## 2015-02-22 NOTE — Progress Notes (Signed)
Transfer patient from 2 M ,placed in bed comfortably.Alert and oriented x 4.with a purple sitter.Suicide protocol patient implemented.Skin issues are 1.Left wrist  Clean lacerated wound 3.5 inches well proximated with 4 staples. 2.Bruised and skin tear on his left cheek 2.Bruised and scab over wound on the back of his head. 3.Scab over wound on top of his lip. 4.Stage 1 on left buttock 2.5 cm x 3.5cm. with pink dressing foam.Charge nurse made aware of needs of a purple sitter.Will monitor.

## 2015-02-22 NOTE — Progress Notes (Signed)
eLink Physician-Brief Progress Note Patient Name: [name] [name] DOB: 08-19-1960 MRN: [id number]   Date of Service  02/22/2015  HPI/Events of Note  Multiple issues: 1. Patient c/o pain and 2. Patient wants sleep aide.  eICU Interventions  Will order: 1. Restart home [name] dose.  2. Seroquel 25 mg PO at HS X 1.     Intervention Category Intermediate Interventions: Pain - evaluation and management;Other:  Lysle Dingwall 02/22/2015, 9:14 PM

## 2015-02-22 NOTE — Progress Notes (Signed)
[facility] Progress Note    Assessment/ Plan:   1. Acute renal failure: Possibly hemodynamically mediated with lisinopril/hydrochlorothiazide and metformin overdose. Renal U/S without evidence of hydronephrosis. Sill oliguric with only 90cc out in the last 24hrs. Patient's platelets dropped significantly concerning for HITT, however HIT antibody negative therefore could consider tight heparin.  Since off pressors, can proceed with intermittent HD, will need catheter placed.  2. Metformin overdose/toxicity with lactic acidosis: With severe anion gap metabolic acidosis, elevated lactic acid levels and depressed mentation on admission. Normalized lactic acid. Still with bicarb with CVVHD, however will change to intermittent HD.  3. Suicidal attempt: Status post repair of left slit wrist and will undertake hemodialysis for management of profound metabolic acidosis from metformin toxicity. Off fomepizole. Has sitter at bedside.  4. Hypotension, resolved: Possibly from overdose with antihypertensive medications and profound metabolic acidosis, however now hypertensive in the 140s-160s/70s. Still on Solu-cortef.  5. Hypocalcemia: Resolved with supplementation.   6. Concerns for pneumonia: patient spiked fevers with atelectasis vs infiltrate on CXR, on vanc and Zosyn, however lactic acid normalized.  Concern for masslike density within the right lower lobe, f/u per primary team.  7. Diabetes: Patient with poor PO intake, now off tube feeds since extubated.   Subjective:   Patient extubated.  Off all pressors since 2/8. Doing well, concerned about renal function.    Objective:   BP 131/72 mmHg  Pulse 85  Temp(Src) 98.2 F (36.8 C) (Core (Comment))  Resp 22  Ht 5\' 9"  (1.753 m)  Wt 252 lb 6.8 oz (114.5 kg)  BMI 37.26 kg/m2  SpO2 97%  Intake/Output Summary (Last 24 hours) at 02/22/15 0856 Last data filed at 02/22/15 0700  Gross per 24 hour  Intake 858.24 ml  Output   1041 ml   Net -182.76 ml   Weight change: -3 lb 8.4 oz (-1.6 kg)  Physical Exam: General: Lying in bed in NAD.  Cardiovascular: RRR. No murmurs, rubs, or gallops noted.  Respiratory: No increased WOB. CTAB over the anterior chest wall without wheezing, rhonchi, or crackles noted. Abdomen: +BS, soft, non-distended, non-tender. No rigidity.  Skin: left wrist dressing clean/dry.  LE: No pitting edema noted.   Imaging: Ct Abdomen Pelvis Wo Contrast  02/20/2015  CLINICAL DATA:  Distended abdomen pain EXAM: CT ABDOMEN AND PELVIS WITHOUT CONTRAST TECHNIQUE: Multidetector CT imaging of the abdomen and pelvis was performed following the standard protocol without IV contrast. COMPARISON:  None. FINDINGS: Lung bases demonstrate right lower lobe consolidation and small effusion. On image number 1 of series 201, there is suggestion of a focal mass lesion. This may represent rounded pneumonia. Left basilar atelectasis is noted as well. The liver, gallbladder, spleen, adrenal glands and pancreas are within normal limits. A nasogastric catheter is noted within the stomach. The kidneys demonstrate no renal calculi or obstructive changes. The appendix is not well visualized although no inflammatory changes to suggest appendicitis are seen. The bladder is decompressed by Foley catheter. No pelvic mass lesion is noted. Arterial and venous catheters are noted in the left inguinal region. No acute bony abnormality is noted. IMPRESSION: No explanation for the patient's distended abdomen is noted. Changes in the bases bilaterally. There is some suggestion of a masslike density within the right lower lobe. Follow-up CT of the chest when the patient's condition improves is recommended. Imaging at this point would not be helpful given the consolidation and effusion noted. Electronically Signed   By: Inez Catalina M.D.   On:  02/20/2015 15:43   Dg Chest Port 1 View  02/21/2015  CLINICAL DATA:  Acute respiratory failure, intubated  patient, overdose, acute renal failure. EXAM: PORTABLE CHEST 1 VIEW COMPARISON:  Portable chest x-ray of February 20, 2015 FINDINGS: The lungs are better inflated today. Subsegmental atelectasis in the right lower lung persists. The left lung is clear. The heart and pulmonary vascularity are normal. The endotracheal tube tip lies 5.5 cm above the carina. The esophagogastric tube tip projects below the inferior margin of the image. The bony structures exhibit no acute abnormalities. IMPRESSION: Persistent subsegmental atelectasis at the right lung base. Overall improved aeration of the lungs. The support tubes are in reasonable position. Electronically Signed   By: David  Martinique M.D.   On: 02/21/2015 07:09    Labs: BMET  Recent Labs Lab 02/19/15 0430 02/19/15 1524 02/20/15 0509 02/20/15 1712 02/21/15 0415 02/21/15 1547 02/22/15 0340  NA 138 137 133* 132* 133* 136 136  K 3.1* 3.4* 3.8 4.1 4.1 4.1 4.0  CL 97* 92* 91* 89* 89* 90* 93*  CO2 12* 21* 18* 21* 23 31 28   GLUCOSE 409* 142* 146* 164* 173* 139* 180*  BUN 31* 30* 34* 38* 46* 54* 55*  CREATININE 3.80* 3.25* 3.49* 3.58* 3.65* 3.52* 3.73*  CALCIUM 6.1* 7.0* 7.1* 6.8* 6.9* 7.1* 7.1*  PHOS 4.2 3.1 3.6 4.7* 3.6 4.5 4.3   Anion gap 24 CBC  Recent Labs Lab 02/18/15 0440  02/19/15 0430 02/20/15 0509 02/21/15 0420 02/22/15 0340  WBC 9.5  < > 29.3* 10.4 5.4 7.8  NEUTROABS 7.6  --   --   --   --   --   HGB 14.2  < > 12.7* 9.7* 8.7* 9.2*  HCT 41.6  < > 36.4* 29.2* 25.3* 27.1*  MCV 81.6  < > 80.2 79.3 78.8 78.8  PLT 182  < > 268 120* 78* 89*  < > = values in this interval not displayed.  Medications:    . anticoagulant sodium citrate  5 mL Intravenous Once  . antiseptic oral rinse  7 mL Mouth Rinse QID  . chlorhexidine gluconate  15 mL Mouth Rinse BID  . hydrocortisone sod succinate (SOLU-CORTEF) inj  50 mg Intravenous Q6H  . [START ON 02/23/2015] Influenza vac split quadrivalent PF  0.5 mL Intramuscular Tomorrow-1000  . insulin  aspart  1-3 Units Subcutaneous 6 times per day  . insulin glargine  15 Units Subcutaneous QHS  . pantoprazole (PROTONIX) IV  40 mg Intravenous Daily  . piperacillin-tazobactam  3.375 g Intravenous 4 times per day  . [START ON 02/23/2015] pneumococcal 23 valent vaccine  0.5 mL Intramuscular Tomorrow-1000  . sodium chloride flush  3 mL Intravenous Q12H  . vancomycin  1,250 mg Intravenous Q24H      Kathrine Cords, MD [place] Resident, PGY-2 02/22/2015, 8:56 AM  I have seen and examined this patient and agree with plan per Dr Lorenso Courier.  UO still poor.  BP better and off pressors.  Extubated.  Will need HD placed in neck so can DC femoral cath and discussed this with Dr Vaughan Browner.  Anticipate renal recovery at some point.  Will plan IHD tomorrow.   Ethaniel Garfield T,MD 02/22/2015 9:21 AM

## 2015-02-23 ENCOUNTER — Inpatient Hospital Stay (HOSPITAL_COMMUNITY): Payer: MEDICAID

## 2015-02-23 DIAGNOSIS — E119 Type 2 diabetes mellitus without complications: Secondary | ICD-10-CM

## 2015-02-23 DIAGNOSIS — F319 Bipolar disorder, unspecified: Secondary | ICD-10-CM

## 2015-02-23 DIAGNOSIS — Z992 Dependence on renal dialysis: Secondary | ICD-10-CM

## 2015-02-23 DIAGNOSIS — N19 Unspecified kidney failure: Secondary | ICD-10-CM

## 2015-02-23 DIAGNOSIS — T1491 Suicide attempt: Secondary | ICD-10-CM

## 2015-02-23 DIAGNOSIS — D6959 Other secondary thrombocytopenia: Secondary | ICD-10-CM

## 2015-02-23 LAB — GLUCOSE, CAPILLARY
Glucose-Capillary: 251 mg/dL — ABNORMAL HIGH (ref 65–99)
Glucose-Capillary: 288 mg/dL — ABNORMAL HIGH (ref 65–99)
Glucose-Capillary: 206 mg/dL — ABNORMAL HIGH (ref 65–99)

## 2015-02-23 LAB — CBC
HCT: 23.9 % — ABNORMAL LOW (ref 39.0–52.0)
Hemoglobin: 8.2 g/dL — ABNORMAL LOW (ref 13.0–17.0)
MCH: 26.5 pg (ref 26.0–34.0)
MCHC: 34.3 g/dL (ref 30.0–36.0)
MCV: 77.3 fL — ABNORMAL LOW (ref 78.0–100.0)
Platelets: 94 10*3/uL — ABNORMAL LOW (ref 150–400)
RBC: 3.09 MIL/uL — ABNORMAL LOW (ref 4.22–5.81)
RDW: 13.5 % (ref 11.5–15.5)
WBC: 5.7 10*3/uL (ref 4.0–10.5)

## 2015-02-23 LAB — RENAL FUNCTION PANEL
Albumin: 2.1 g/dL — ABNORMAL LOW (ref 3.5–5.0)
Anion gap: 17 — ABNORMAL HIGH (ref 5–15)
BUN: 99 mg/dL — ABNORMAL HIGH (ref 6–20)
CO2: 27 mmol/L (ref 22–32)
Calcium: 7.4 mg/dL — ABNORMAL LOW (ref 8.9–10.3)
Chloride: 90 mmol/L — ABNORMAL LOW (ref 101–111)
Creatinine, Ser: 7.43 mg/dL — ABNORMAL HIGH (ref 0.61–1.24)
GFR calc Af Amer: 9 mL/min — ABNORMAL LOW (ref >60)
GFR calc non Af Amer: 7 mL/min — ABNORMAL LOW (ref >60)
Glucose, Bld: 354 mg/dL — ABNORMAL HIGH (ref 65–99)
Phosphorus: 7 mg/dL — ABNORMAL HIGH (ref 2.5–4.6)
Potassium: 3.6 mmol/L (ref 3.5–5.1)
Sodium: 134 mmol/L — ABNORMAL LOW (ref 135–145)

## 2015-02-23 LAB — CULTURE, RESPIRATORY

## 2015-02-23 LAB — CULTURE, RESPIRATORY W GRAM STAIN: Special Requests: NORMAL

## 2015-02-23 LAB — HEPATITIS B SURFACE ANTIGEN: Hepatitis B Surface Ag: NEGATIVE

## 2015-02-23 LAB — CALCIUM, IONIZED: Calcium, Ionized, Serum: 4.2 mg/dL — ABNORMAL LOW (ref 4.5–5.6)

## 2015-02-23 MED ORDER — GABAPENTIN 600 MG PO TABS
300.0000 mg | ORAL_TABLET | Freq: Three times a day (TID) | ORAL | Status: DC
  Administered 2015-02-23: 300 mg via ORAL
  Filled 2015-02-23: qty 1

## 2015-02-23 MED ORDER — HEPARIN SODIUM (PORCINE) 1000 UNIT/ML IJ SOLN
INTRAMUSCULAR | Status: AC
  Filled 2015-02-23: qty 1

## 2015-02-23 MED ORDER — ANTICOAGULANT SODIUM CITRATE 4% (200MG/5ML) IV SOLN
5.0000 mL | Status: AC
  Filled 2015-02-23: qty 250

## 2015-02-23 MED ORDER — GABAPENTIN 600 MG PO TABS
300.0000 mg | ORAL_TABLET | Freq: Every day | ORAL | Status: DC
  Administered 2015-02-24 – 2015-02-25 (×2): 300 mg via ORAL
  Filled 2015-02-23 (×2): qty 1

## 2015-02-23 MED ORDER — PANTOPRAZOLE SODIUM 40 MG PO TBEC
40.0000 mg | DELAYED_RELEASE_TABLET | Freq: Every day | ORAL | Status: DC
  Administered 2015-02-24 – 2015-03-11 (×16): 40 mg via ORAL
  Filled 2015-02-23 (×18): qty 1

## 2015-02-23 MED ORDER — ACETAMINOPHEN 325 MG PO TABS
650.0000 mg | ORAL_TABLET | Freq: Four times a day (QID) | ORAL | Status: DC | PRN
  Administered 2015-02-25 – 2015-03-01 (×3): 650 mg via ORAL
  Filled 2015-02-23 (×3): qty 2

## 2015-02-23 MED ORDER — LIDOCAINE HCL 1 % IJ SOLN
INTRAMUSCULAR | Status: AC
  Filled 2015-02-23: qty 20

## 2015-02-23 MED ORDER — INSULIN GLARGINE 100 UNIT/ML ~~LOC~~ SOLN
10.0000 [IU] | Freq: Every day | SUBCUTANEOUS | Status: DC
  Administered 2015-02-23 – 2015-02-24 (×2): 10 [IU] via SUBCUTANEOUS
  Filled 2015-02-23 (×3): qty 0.1

## 2015-02-23 MED ORDER — ANTICOAGULANT SODIUM CITRATE 4% (200MG/5ML) IV SOLN: 5.0000 mL | Freq: Once | Status: DC

## 2015-02-23 MED ORDER — AMOXICILLIN-POT CLAVULANATE 500-125 MG PO TABS
500.0000 mg | ORAL_TABLET | Freq: Every day | ORAL | Status: DC
  Administered 2015-02-23 – 2015-02-27 (×5): 500 mg via ORAL
  Filled 2015-02-23 (×5): qty 1

## 2015-02-23 MED ORDER — NEPRO/CARBSTEADY PO LIQD
237.0000 mL | Freq: Three times a day (TID) | ORAL | Status: DC
  Administered 2015-02-23 – 2015-02-28 (×8): 237 mL via ORAL

## 2015-02-23 MED ORDER — HYDROCORTISONE NA SUCCINATE PF 100 MG IJ SOLR
50.0000 mg | Freq: Two times a day (BID) | INTRAMUSCULAR | Status: AC
Start: 2015-02-23 — End: 2015-02-23
  Administered 2015-02-23: 50 mg via INTRAVENOUS
  Filled 2015-02-23: qty 2

## 2015-02-23 NOTE — Progress Notes (Signed)
[facility] Progress Note    Assessment/ Plan:   1. Acute renal failure: Possibly hemodynamically mediated with lisinopril/hydrochlorothiazide and metformin overdose. Renal U/S without evidence of hydronephrosis. Sill oliguric with only 145cc out in the last 24hrs. Since off pressors, can proceed with intermittent HD, will need catheter placed. Consult IR for placement of catheter. Decreased gabapentin given his renal function.  2. Metformin overdose/toxicity with lactic acidosis: With severe anion gap metabolic acidosis, elevated lactic acid levels and depressed mentation on admission. Normalized lactic acid. Intermittent HD to start today.  3. Suicidal attempt: Status post repair of left slit wrist and will undertake hemodialysis for management of profound metabolic acidosis from metformin toxicity. Off fomepizole. Has sitter at bedside.  4. Hypotension, resolved: Possibly from overdose with antihypertensive medications and profound metabolic acidosis, however now hypertensive in the 140s-150s/70-80s. Still on Solu-cortef> consider tapering.   5. Hypocalcemia: Resolved with supplementation.   6. Concerns for pneumonia: patient spiked fevers with atelectasis vs infiltrate on CXR, was on vanc and Zosyn, now on Augmentin.   7. Diabetes: Hyperglycemic in the 200s, tighter glycemic control.   Subjective:   Unsuccessful bedside dialysis catheter placement yesterday due to patient intolerance of lying flat/still. No pain, SOB, or chest pain. Notes he's eating well.    Objective:   BP 147/78 mmHg  Pulse 83  Temp(Src) 98.2 F (36.8 C) (Oral)  Resp 20  Ht 5\' 9"  (1.753 m)  Wt 260 lb 5.8 oz (118.1 kg)  BMI 38.43 kg/m2  SpO2 95%  Intake/Output Summary (Last 24 hours) at 02/23/15 0701 Last data filed at 02/23/15 0600  Gross per 24 hour  Intake   1514 ml  Output    145 ml  Net   1369 ml   Weight change: 7 lb 15 oz (3.6 kg)  Physical Exam: General: Lying in bed sleeping,  initially difficult to wake up, later answering questions appropriately.  Cardiovascular: RRR. No murmurs, rubs, or gallops noted.  Respiratory: No increased WOB. CTAB over the anterior chest wall without wheezing, rhonchi, or crackles noted. Abdomen: +BS, soft, non-distended, non-tender. No rigidity.  Skin: left wrist dressing clean/dry.  LE: No pitting edema noted.   Imaging: Dg Chest Port 1 View  02/22/2015  CLINICAL DATA:  Unsuccessful attempt at of right-sided central line placement. Evaluate for pneumothorax. EXAM: PORTABLE CHEST 1 VIEW COMPARISON:  02/21/2015 FINDINGS: There is no evidence of a pneumothorax. Opacity at the right lung base is unchanged, likely atelectasis. No new lung opacities. Endotracheal Tube and nasal/orogastric tube have been removed since the prior exam. IMPRESSION: 1. No evidence of a pneumothorax following central line attempt. 2. Persistent right lung base opacity consistent with atelectasis. Lungs otherwise clear. 3. Status post extubation and removal of the oral/nasogastric tube. Electronically Signed   By: Lajean Manes M.D.   On: 02/22/2015 17:15    Labs: BMET  Recent Labs Lab 02/19/15 1524 02/20/15 0509 02/20/15 1712 02/21/15 0415 02/21/15 1547 02/22/15 0340 02/22/15 1530  NA 137 133* 132* 133* 136 136 135  K 3.4* 3.8 4.1 4.1 4.1 4.0 3.7  CL 92* 91* 89* 89* 90* 93* 93*  CO2 21* 18* 21* 23 31 28 26   GLUCOSE 142* 146* 164* 173* 139* 180* 271*  BUN 30* 34* 38* 46* 54* 55* 72*  CREATININE 3.25* 3.49* 3.58* 3.65* 3.52* 3.73* 4.83*  CALCIUM 7.0* 7.1* 6.8* 6.9* 7.1* 7.1* 7.1*  PHOS 3.1 3.6 4.7* 3.6 4.5 4.3 5.2*   Anion gap 24 CBC  Recent Labs  Lab 02/18/15 0440  02/19/15 0430 02/20/15 0509 02/21/15 0420 02/22/15 0340  WBC 9.5  < > 29.3* 10.4 5.4 7.8  NEUTROABS 7.6  --   --   --   --   --   HGB 14.2  < > 12.7* 9.7* 8.7* 9.2*  HCT 41.6  < > 36.4* 29.2* 25.3* 27.1*  MCV 81.6  < > 80.2 79.3 78.8 78.8  PLT 182  < > 268 120* 78* 89*  < > =  values in this interval not displayed.  Medications:    . amoxicillin-clavulanate  1 tablet Oral Q24H  . anticoagulant sodium citrate  5 mL Intravenous Once  . antiseptic oral rinse  7 mL Mouth Rinse QID  . chlorhexidine gluconate  15 mL Mouth Rinse BID  . gabapentin  600 mg Oral TID  . hydrocortisone sod succinate (SOLU-CORTEF) inj  50 mg Intravenous Q6H  . Influenza vac split quadrivalent PF  0.5 mL Intramuscular Tomorrow-1000  . insulin aspart  0-5 Units Subcutaneous QHS  . insulin aspart  0-9 Units Subcutaneous TID WC  . pantoprazole (PROTONIX) IV  40 mg Intravenous Daily  . pneumococcal 23 valent vaccine  0.5 mL Intramuscular Tomorrow-1000  . sodium chloride flush  3 mL Intravenous Q12H      Kathrine Cords, MD [facility] Resident, PGY-2 02/23/2015, 7:01 AM  I have seen and examined this patient and agree with plan per Dr Lorenso Courier.  Will plan HD today and arrange with IR to get Temp cath in IJ.   Still no significant UO.  Decrease dose of neurontin.  Please taper steroids rapidly.  Cont daily labs Graeden Bitner T,MD 02/23/2015 11:12 AM
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
## 2015-02-23 NOTE — Consult Note (Signed)
Referral MD  Reason for Referral: Heparin Induced thrombocytopenia  Chief Complaint  Patient presents with  . Drug Overdose  . Suicide Attempt  : Cannot give much history  HPI: [name] [name] is a 55 year old white male. He was admitted after a suicide attempt. He apparently took quite a bit of metformin.  He is in renal failure. He was started on dialysis. He's been getting heparin with the dialysis.  When he was admitted, his platelet count was 180,000. It went up to 268,000. It then dropped. On 02/21/2015, it went down to 78,000.  He had the workup for heparin-induced thrombocytopenia. He had the serotonin release assay test done. The SRA was 61. This is a positive test.  The heparin has been stopped. His blood count has trended upward. The plate count on 624THL was 89.  We were asked to see him to see if he has heparin-induced thrombocytopenia.   Past Medical History  Diagnosis Date  . Depressed bipolar disorder ([place])   . Hypertension   . Diabetes mellitus without complication ([place])   . Sleep apnea   . Diabetic neuropathy ([place])   :  Past Surgical History  Procedure Laterality Date  . Spine surgery  2010  . Shoulder arthroscopy with rotator cuff repair Right 2009  :   Current facility-administered medications:  .  0.9 %  sodium chloride infusion, 250 mL, Intravenous, PRN, Luz Brazen, MD, Last Rate: 10 mL/hr at 02/21/15 1000, 250 mL at 02/21/15 1000 .  0.9 %  sodium chloride infusion, 250 mL, Intravenous, PRN, Aquilla Hacker, MD, Last Rate: 10 mL/hr at 02/21/15 2103, 250 mL at 02/21/15 2103 .  acetaminophen (TYLENOL) solution 650 mg, 650 mg, Per Tube, Q6H PRN, Anders Simmonds, MD, 650 mg at 02/22/15 2144 .  albuterol (PROVENTIL) (2.5 MG/3ML) 0.083% nebulizer solution 2.5 mg, 2.5 mg, Nebulization, Q2H PRN, Tammy S Parrett, NP .  amoxicillin-clavulanate (AUGMENTIN) 250-125 MG per tablet 1 tablet, 1 tablet, Oral, Q24H, Aquilla Hacker, MD, 1 tablet at 02/22/15  1418 .  anticoagulant sodium citrate solution 5 mL, 5 mL, Intravenous, Once, Praveen Mannam, MD, 5 mL at 02/22/15 1630 .  antiseptic oral rinse solution (CORINZ), 7 mL, Mouth Rinse, QID, Tammy S Parrett, NP, 7 mL at 02/22/15 1531 .  chlorhexidine gluconate (PERIDEX) 0.12 % solution 15 mL, 15 mL, Mouth Rinse, BID, Tammy S Parrett, NP, 15 mL at 02/21/15 2015 .  gabapentin ([name]) tablet 600 mg, 600 mg, Oral, TID, Anders Simmonds, MD, 600 mg at 02/22/15 2144 .  hydrocortisone sodium succinate (SOLU-CORTEF) 100 MG injection 50 mg, 50 mg, Intravenous, Q6H, Brand Males, MD, 50 mg at 02/23/15 0215 .  Influenza vac split quadrivalent PF (FLUARIX) injection 0.5 mL, 0.5 mL, Intramuscular, Tomorrow-1000, Rush Farmer, MD .  insulin aspart (novoLOG) injection 0-5 Units, 0-5 Units, Subcutaneous, QHS, Anders Simmonds, MD, 3 Units at 02/22/15 2230 .  insulin aspart (novoLOG) injection 0-9 Units, 0-9 Units, Subcutaneous, TID WC, Anders Simmonds, MD .  ondansetron [facility]) injection 4 mg, 4 mg, Intravenous, Q6H PRN, Flora Lipps, MD, 4 mg at 02/22/15 0133 .  pantoprazole (PROTONIX) injection 40 mg, 40 mg, Intravenous, Daily, Tammy S Parrett, NP, 40 mg at 02/22/15 0907 .  phenylephrine (NEO-SYNEPHRINE) 40 mg in dextrose 5 % 250 mL (0.16 mg/mL) infusion, 0-400 mcg/min, Intravenous, Titrated, Rush Farmer, MD, Stopped at 02/21/15 0405 .  pneumococcal 23 valent vaccine (PNU-IMMUNE) injection 0.5 mL, 0.5 mL, Intramuscular, Tomorrow-1000, Rush Farmer, MD .  sodium chloride flush (NS) 0.9 % injection 3 mL, 3 mL, Intravenous, Q12H, Aquilla Hacker, MD, 3 mL at 02/22/15 2148 .  sodium chloride flush (NS) 0.9 % injection 3 mL, 3 mL, Intravenous, PRN, York Ram Melancon, MD .  vasopressin (PITRESSIN) 40 Units in sodium chloride 0.9 % 250 mL (0.16 Units/mL) infusion, 0.03 Units/min, Intravenous, Continuous, Tammy S Parrett, NP, Stopped at 02/21/15 0630:  . amoxicillin-clavulanate  1 tablet Oral Q24H  .  anticoagulant sodium citrate  5 mL Intravenous Once  . antiseptic oral rinse  7 mL Mouth Rinse QID  . chlorhexidine gluconate  15 mL Mouth Rinse BID  . gabapentin  600 mg Oral TID  . hydrocortisone sod succinate (SOLU-CORTEF) inj  50 mg Intravenous Q6H  . Influenza vac split quadrivalent PF  0.5 mL Intramuscular Tomorrow-1000  . insulin aspart  0-5 Units Subcutaneous QHS  . insulin aspart  0-9 Units Subcutaneous TID WC  . pantoprazole (PROTONIX) IV  40 mg Intravenous Daily  . pneumococcal 23 valent vaccine  0.5 mL Intramuscular Tomorrow-1000  . sodium chloride flush  3 mL Intravenous Q12H  :  Allergies  Allergen Reactions  . Heparin Other (See Comments)    HIT Ab negative on 02/20/15, but SRA POSITIVE   . Oxytetracycline Rash  . Sulfonamide Derivatives Rash  :  Family History  Problem Relation Age of Onset  . Diabetes Mother   . Hyperlipidemia Mother   . Heart disease Father   :  Social History   Social History  . Marital Status: Single    Spouse Name: N/A  . Number of Children: N/A  . Years of Education: N/A   Occupational History  . Not on file.   Social History Main Topics  . Smoking status: Never Smoker   . Smokeless tobacco: Not on file  . Alcohol Use: No  . Drug Use: No  . Sexual Activity: Not on file   Other Topics Concern  . Not on file   Social History Narrative  :  Pertinent items are noted in HPI.  Exam: Patient Vitals for the past 24 hrs:  BP Temp Temp src Pulse Resp SpO2 Weight  02/23/15 0542 (!) 147/78 mmHg 98.2 F (36.8 C) Oral 83 20 95 % -  02/22/15 2036 (!) 148/81 mmHg 98.4 F (36.9 C) Oral 84 17 98 % 260 lb 5.8 oz (118.1 kg)  02/22/15 1800 (!) 153/70 mmHg - - 80 (!) 25 99 % -  02/22/15 1700 - - - 80 18 98 % -  02/22/15 1600 (!) 149/58 mmHg - - 73 (!) 25 96 % -  02/22/15 1515 - 98.6 F (37 C) Oral - - - -  02/22/15 1500 - - - 83 17 100 % -  02/22/15 1400 - - - 84 19 99 % -  02/22/15 1300 - - - 83 17 99 % -  02/22/15 1200 (!) 151/75  mmHg - - 85 (!) 28 96 % -  02/22/15 1145 - 98.5 F (36.9 C) Oral - - - -  02/22/15 1100 - 98.6 F (37 C) - 74 (!) 26 97 % -  02/22/15 1000 - 99.1 F (37.3 C) - 89 14 99 % -  02/22/15 0950 (!) 146/72 mmHg 99 F (37.2 C) - 89 16 98 % -  02/22/15 0900 - 99 F (37.2 C) - 83 - 93 % -  02/22/15 0800 - 98.2 F (36.8 C) - 85 (!) 22 97 % -  02/22/15 0700 -  98.6 F (37 C) - 84 20 98 % -   as above   Recent Labs  02/21/15 0420 02/22/15 0340  WBC 5.4 7.8  HGB 8.7* 9.2*  HCT 25.3* 27.1*  PLT 78* 89*    Recent Labs  02/22/15 0340 02/22/15 1530  NA 136 135  K 4.0 3.7  CL 93* 93*  CO2 28 26  GLUCOSE 180* 271*  BUN 55* 72*  CREATININE 3.73* 4.83*  CALCIUM 7.1* 7.1*    Blood smear review:  none   Pathology: None     Assessment and Plan:  [name] [name] is a 55 year old white male. He has multiple medical prongs. His psychiatric issues. He try to commit suicide. He took quite a bit of metformin. Sounds are token other medications also.  The serotonin release assay is often considered the gold standard for heparin-induced thrombocytopenia. 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  I suppose that the overdoses that he took of several medications may also cause the pleasure To go down. However, I don't think that they would have affected the serotonin release assay study.  Given the positive test, I would clearly avoid heparin. I'm sure that this is a situation that dialysis has had in the past. I'm sure that they can run normal saline through the dialysis machine.  Hopefully, he will not need to be on dialysis long-term. Hopefully, the renal injury is just transient and related to the overdose.  He has a very low albumin. I suppose may have nephrotic syndrome. He has diabetes. Maybe his kidneys are just injured by the diabetes and this might be long-term.  I have appreciated the opportunity  of seeing [name] [name]. He seems like a nice guy. I just feel bad that he has these psychological issues that he has to try to deal with.  As always, we will pray.hard for him.   Pete E.  2 Corinthians 12:10

## 2015-02-23 NOTE — Procedures (Signed)
Pt seen on HD.  Ap 60 VP 170  BFR 250.  Pt tolerating HD well so far.

## 2015-02-23 NOTE — Progress Notes (Signed)
Nutrition Follow-up  DOCUMENTATION CODES:   Obesity unspecified  INTERVENTION:  -Provide Nepro TID 425 kcal, 19.1 gram of protein per serving -Encourage PO intake  NUTRITION DIAGNOSIS:   Inadequate oral intake related to poor appetite as evidenced by per patient/family report, meal completion < 50%.  GOAL:   Patient will meet greater than or equal to 90% of their needs  MONITOR:   Diet advancement, Supplement acceptance, PO intake, Weight trends  ASSESSMENT:   55 y/o man with bipolar d/o who attempted suicide with polysubstance overdose and slit wrist  2/8- extubated 2/9-diet advancement to soft diet 2/10- Pt currently undergoing intermittent HD   Pt seen for follow up. Pt states his appetite has not been good since admission. He only ate a little bit of oatmeal for breakfast. Per chart he is consuming 30% of meals. PTA his appetite was good, he consumed 3 meals daily. Pt agreed to trying Nepro to help meet his nutritional needs.   Pt reports no recent weight loss.  Medications reviewed. Labs reviewed; Phosphorus 5.2, CBG 146-288  Diet Order:  DIET SOFT Room service appropriate?: Yes; Fluid consistency:: Thin  Skin:  Wound (see comment) (Stage I pressure ulcer on bony prominence; laceration on L wrist )  Last BM:  02/22/2015  Height:   Ht Readings from Last 1 Encounters:  02/18/15 5\' 9"  (1.753 m)    Weight:   Wt Readings from Last 1 Encounters:  02/22/15 260 lb 5.8 oz (118.1 kg)    Ideal Body Weight:  72.7 kg  BMI:  Body mass index is 38.43 kg/(m^2).  Estimated Nutritional Needs:   Kcal:  2200-2400  Protein:  110-120 gm  Fluid:  Per MD  EDUCATION NEEDS:   No education needs identified at this time  Raford Pitcher, Dietetic Intern Pager: [phone]

## 2015-02-23 NOTE — Progress Notes (Signed)
Inpatient Diabetes Program Recommendations  AACE/ADA: New Consensus Statement on Inpatient Glycemic Control (2015)  Target Ranges:  Prepandial:   less than 140 mg/dL      Peak postprandial:   less than 180 mg/dL (1-2 hours)      Critically ill patients:  140 - 180 mg/dL   Review of Glycemic Control  Inpatient Diabetes Program Recommendations  Noted solucortef now given q12 hrs from q6 hrs. Patient received lantus 15 units last HS. No further orders now for basal insulin. Patient may well need some basal at this point as glucose continues in the 200's. Would recommend to continue the lantus at 15 units at HS. :   Thank you Rosita Kea, RN, MSN, CDE  Diabetes Inpatient Program Office: [phone] Pager: [phone] 8:00 am to 5:00 pm

## 2015-02-23 NOTE — Progress Notes (Signed)
Pt c/o SOB. Does not appear in distress other than increased respiration rate (24). Pt sats 98% on RA. Lungs clear in all fields. Dr. Algis Liming notified and examined pt. Instructed to continue to monitor.

## 2015-02-23 NOTE — Progress Notes (Signed)
PT Cancellation Note  Patient Details Name: [name] [name] MRN: [id number] DOB: 05-01-1960   Cancelled Treatment:    Reason Eval/Treat Not Completed: Medical issues which prohibited therapy. Patient still has femoral HD catheter. On discussing with RN it appears to be temporary. She will consult MD to get order to d/c line. Will attempt to see 2/11 if femoral line removed.   Richardine Peppers 02/23/2015, 4:07 PM  Pager [phone]

## 2015-02-24 LAB — CBC
HCT: 26.5 % — ABNORMAL LOW (ref 39.0–52.0)
Hemoglobin: 9.1 g/dL — ABNORMAL LOW (ref 13.0–17.0)
MCH: 26.7 pg (ref 26.0–34.0)
MCHC: 34.3 g/dL (ref 30.0–36.0)
MCV: 77.7 fL — ABNORMAL LOW (ref 78.0–100.0)
Platelets: 106 10*3/uL — ABNORMAL LOW (ref 150–400)
RBC: 3.41 MIL/uL — ABNORMAL LOW (ref 4.22–5.81)
RDW: 13.1 % (ref 11.5–15.5)
WBC: 5.5 10*3/uL (ref 4.0–10.5)

## 2015-02-24 LAB — GLUCOSE, CAPILLARY
Glucose-Capillary: 273 mg/dL — ABNORMAL HIGH (ref 65–99)
Glucose-Capillary: 315 mg/dL — ABNORMAL HIGH (ref 65–99)
Glucose-Capillary: 270 mg/dL — ABNORMAL HIGH (ref 65–99)
Glucose-Capillary: 278 mg/dL — ABNORMAL HIGH (ref 65–99)

## 2015-02-24 LAB — RENAL FUNCTION PANEL
Albumin: 2.3 g/dL — ABNORMAL LOW (ref 3.5–5.0)
Anion gap: 13 (ref 5–15)
BUN: 74 mg/dL — ABNORMAL HIGH (ref 6–20)
CO2: 28 mmol/L (ref 22–32)
Calcium: 7.7 mg/dL — ABNORMAL LOW (ref 8.9–10.3)
Chloride: 94 mmol/L — ABNORMAL LOW (ref 101–111)
Creatinine, Ser: 7.11 mg/dL — ABNORMAL HIGH (ref 0.61–1.24)
GFR calc Af Amer: 9 mL/min — ABNORMAL LOW (ref >60)
GFR calc non Af Amer: 8 mL/min — ABNORMAL LOW (ref >60)
Glucose, Bld: 352 mg/dL — ABNORMAL HIGH (ref 65–99)
Phosphorus: 4.7 mg/dL — ABNORMAL HIGH (ref 2.5–4.6)
Potassium: 3.8 mmol/L (ref 3.5–5.1)
Sodium: 135 mmol/L (ref 135–145)

## 2015-02-24 LAB — HEPATITIS B SURFACE ANTIBODY, QUANTITATIVE: Hepatitis B-Post: <3.1 m[IU]/mL — ABNORMAL LOW (ref >9.9)

## 2015-02-24 LAB — HEPATITIS B CORE ANTIBODY, TOTAL: Hep B Core Total Ab: NEGATIVE

## 2015-02-24 MED ORDER — DIPHENHYDRAMINE HCL 25 MG PO CAPS
25.0000 mg | ORAL_CAPSULE | Freq: Once | ORAL | Status: AC
  Administered 2015-02-24: 25 mg via ORAL
  Filled 2015-02-24: qty 1

## 2015-02-24 MED ORDER — DIPHENHYDRAMINE HCL 25 MG PO CAPS
50.0000 mg | ORAL_CAPSULE | Freq: Once | ORAL | Status: AC
  Administered 2015-02-24: 50 mg via ORAL
  Filled 2015-02-24: qty 2

## 2015-02-24 MED ORDER — FLUOXETINE HCL 10 MG PO CAPS
10.0000 mg | ORAL_CAPSULE | Freq: Every day | ORAL | Status: DC
  Administered 2015-02-24 – 2015-02-25 (×2): 10 mg via ORAL
  Filled 2015-02-24 (×2): qty 1

## 2015-02-24 MED ORDER — QUETIAPINE FUMARATE 25 MG PO TABS
25.0000 mg | ORAL_TABLET | Freq: Every day | ORAL | Status: DC
  Administered 2015-02-24 – 2015-02-25 (×2): 25 mg via ORAL
  Filled 2015-02-24 (×2): qty 1

## 2015-02-24 NOTE — Progress Notes (Signed)
IV RN notified of order to remove femoral HD catheter after HD. Belisa Eichholz, Wonda Cheng, Therapist, sports

## 2015-02-24 NOTE — Progress Notes (Signed)
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 2224 02/19/15 0430  INR 1.98* 1.86*    No results for input(s): DDIMER in the last 72 hours.  Cardiac Enzymes  Recent Labs Lab 02/20/15 1211 02/20/15 1712 02/20/15 2150  TROPONINI 1.34* 1.10* 0.97*   ------------------------------------------------------------------------------------------------------------------ Invalid input(s): POCBNP   Recent Labs  02/22/15 1742 02/22/15 2144 02/23/15 0755 02/23/15 1148 02/23/15 2108 02/24/15 0805  GLUCAP 237* 264* 251* 288* 43* 22*     Jalil Lorusso M.D. [facility] 02/24/2015, 11:15 AM  Pager: [phone] Between 7am to 7pm - call Pager - [phone]  After 7pm go to [email or web address] - password [email or web address]  Call night coverage person covering after 7pm

## 2015-02-24 NOTE — Evaluation (Signed)
Physical Therapy Evaluation Patient Details Name: [name] [name] MRN: [id number] DOB: 1960-12-07 Today's Date: 02/24/2015   History of Present Illness  55 yo male with bipolar disorder was admitted after a suicide attempt, overdose and cut his wrist.  Had metabolic acidosis, elevated lactate, AKI with elevated creatinine. Has had his temporary femoral HD catheter removed and can now have PT visit.     Clinical Impression  Pt may be heading to a mental health facility after hospital but will definitely need PT intervention to increase LE strength and to increase his control of standing.  Pt is very tall and will need to be in control of standing balance and gait to successfully transition home.    Follow Up Recommendations SNF    Equipment Recommendations  Rolling walker with 5" wheels    Recommendations for Other Services       Precautions / Restrictions Precautions Precautions: Fall Precaution Comments: sitter 24/7 Restrictions Weight Bearing Restrictions: No      Mobility  Bed Mobility Overal bed mobility: Needs Assistance Bed Mobility: Supine to Sit;Sit to Supine     Supine to sit: Min assist Sit to supine: Min assist      Transfers Overall transfer level: Needs assistance Equipment used: Rolling walker (2 wheeled);1 person hand held assist Transfers: Sit to/from [facility] Sit to Stand: Mod assist;From elevated surface Stand pivot transfers: Min assist       General transfer comment: significant amount of cues for his hand placement and reminders for sequence  Ambulation/Gait Ambulation/Gait assistance: Min assist;+2 physical assistance;+2 safety/equipment Ambulation Distance (Feet): 100 Feet Assistive device: Rolling walker (2 wheeled);1 person hand held assist Gait Pattern/deviations: Step-through pattern;Wide base of support;Trunk flexed;Drifts right/left Gait velocity: reduced Gait velocity interpretation: Below normal speed for  age/gender General Gait Details: pt is moving walker too far in front and needs assistance to control for sequencing sitting   Stairs            Wheelchair Mobility    Modified Rankin (Stroke Patients Only)       Balance Overall balance assessment: Needs assistance Sitting-balance support: Feet supported Sitting balance-Leahy Scale: Good     Standing balance support: Bilateral upper extremity supported Standing balance-Leahy Scale: Poor                               Pertinent Vitals/Pain Pain Assessment: No/denies pain    Home Living Family/patient expects to be discharged to:: Unsure                 Additional Comments: may be in a mental health facility    Prior Function Level of Independence: Independent               Hand Dominance        Extremity/Trunk Assessment   Upper Extremity Assessment: Generalized weakness           Lower Extremity Assessment: Generalized weakness      Cervical / Trunk Assessment: Normal  Communication   Communication: No difficulties;Other (comment) (lethargic)  Cognition Arousal/Alertness: Lethargic Behavior During Therapy: Flat affect Overall Cognitive Status: Impaired/Different from baseline Area of Impairment: Memory;Safety/judgement;Problem solving;Orientation Orientation Level: Situation   Memory: Decreased recall of precautions;Decreased short-term memory   Safety/Judgement: Decreased awareness of safety;Decreased awareness of deficits   Problem Solving: Slow processing;Requires verbal cues General Comments: reminders for his walker use and safety    General Comments General comments (skin integrity,  edema, etc.): Pt was able to stand up with help and hesitates through his session of gait, but is able to help turn and maneuver.    Exercises        Assessment/Plan    PT Assessment Patient needs continued PT services  PT Diagnosis Difficulty walking   PT Problem List  Decreased strength;Decreased range of motion;Decreased activity tolerance;Decreased balance;Decreased mobility;Decreased coordination;Decreased cognition;Decreased knowledge of use of DME;Decreased safety awareness;Decreased knowledge of precautions;Cardiopulmonary status limiting activity;Decreased skin integrity;Obesity  PT Treatment Interventions DME instruction;Gait training;Stair training;Functional mobility training;Therapeutic activities;Therapeutic exercise;Balance training;Neuromuscular re-education;Patient/family education   PT Goals (Current goals can be found in the Care Plan section) Acute Rehab PT Goals Patient Stated Goal: none stated PT Goal Formulation: With patient Time For Goal Achievement: 03/10/15 Potential to Achieve Goals: Good    Frequency Min 3X/week   Barriers to discharge Other (comment) (Will likely not be going directly home)      Co-evaluation               End of Session Equipment Utilized During Treatment: Gait belt Activity Tolerance: Patient tolerated treatment well;Patient limited by lethargy Patient left: in chair;with call bell/phone within reach;with family/visitor present;with nursing/sitter in room Nurse Communication: Mobility status         Time: 1215-1232 PT Time Calculation (min) (ACUTE ONLY): 17 min   Charges:   PT Evaluation $PT Eval Moderate Complexity: 1 Procedure     PT G CodesRamond Dial Mar 12, 2015, 2:19 PM  Mee Hives, PT MS Acute Rehab Dept. Number: [facility] [phone] and [place] [phone]

## 2015-02-24 NOTE — Progress Notes (Signed)
[facility] Progress Note    Assessment/ Plan:   1. Acute renal failure: Possibly hemodynamically mediated with lisinopril/hydrochlorothiazide and metformin overdose. Renal U/S without evidence of hydronephrosis. Anuric. Had first session of intermittent HD on 2/10 and tolerated it well.  R IJ placed on 2/10.  Pre and post dialysis weight 252> 248lb, removed 2L (goal). Will do dialysis again on Monday, no labs present from post-dialysis, will get a renal function tomorrow AM.  2. Metformin overdose/toxicity with lactic acidosis: With severe anion gap metabolic acidosis, elevated lactic acid levels and depressed mentation on admission. Normalized lactic acid.   3. Suicidal attempt: Status post repair of left slit wrist. Continue HD for metformin toxicity. Off fomepizole. Has sitter at bedside.  4. Hypotension, resolved: Still on Solu-cortef> Continue to taper this quickly.   5. Hypocalcemia: Resolved with supplementation.   6. Concerns for pneumonia: patient spiked fevers with atelectasis vs infiltrate on CXR, was on vanc and Zosyn, now on Augmentin.   7. Diabetes: Hyperglycemic in the 200s, tighter glycemic control.   Subjective:   Patient "not feeling well." Endorses insomnia. Curious when he will be restarted on Seroquel and Wellbutrin. Breathing is okay. Tolerated HD.    Objective:   BP 156/60 mmHg  Pulse 70  Temp(Src) 98.7 F (37.1 C) (Oral)  Resp 18  Ht 5' 9" (1.753 m)  Wt 248 lb 7.3 oz (112.7 kg)  BMI 36.67 kg/m2  SpO2 95%  Intake/Output Summary (Last 24 hours) at 02/24/15 8299 Last data filed at 02/24/15 3716  Gross per 24 hour  Intake    600 ml  Output   2001 ml  Net  -1401 ml   Weight change: -7 lb 7.9 oz (-3.4 kg)  Physical Exam: General: Lying in bed sleeping, answering questions appropriately.  Cardiovascular: RRR. No murmurs, rubs, or gallops noted.  Respiratory: No increased WOB. CTAB without wheezing, rhonchi, or crackles noted. Abdomen: +BS,  soft, non-distended, non-tender. No rigidity.  LE: No pitting edema noted. Neuro: No asterixis   Imaging: Ir Fluoro Guide Cv Line Right  02/23/2015  INDICATION: End-stage renal disease. In need of intravenous access for the initiation of dialysis. EXAM: NON-TUNNELED CENTRAL VENOUS HEMODIALYSIS CATHETER PLACEMENT WITH ULTRASOUND AND FLUOROSCOPIC GUIDANCE COMPARISON:  None. MEDICATIONS: None FLUOROSCOPY TIME:  18 seconds (3.9 mGy) COMPLICATIONS: None immediate. PROCEDURE: Informed written consent was obtained from the patient after a discussion of the risks, benefits, and alternatives to treatment. Questions regarding the procedure were encouraged and answered. The right neck and chest were prepped with chlorhexidine in a sterile fashion, and a sterile drape was applied covering the operative field. Maximum barrier sterile technique with sterile gowns and gloves were used for the procedure. A timeout was performed prior to the initiation of the procedure. After creating a small venotomy incision, a micropuncture kit was utilized to access the right internal jugular vein under direct, real-time ultrasound guidance after the overlying soft tissues were anesthetized with 1% lidocaine with epinephrine. Ultrasound image documentation was performed. The microwire was kinked to measure appropriate catheter length. A stiff glidewire was advanced to the level of the IVC. Under fluoroscopic guidance, the venotomy was serially dilated, ultimately allowing placement of a 20 cm temporary Trialysis catheter with tip ultimately terminating within the superior aspect of the right atrium. Final catheter positioning was confirmed and documented with a spot radiographic image. The catheter aspirates and flushes normally. The catheter was flushed with appropriate volume heparin dwells. The catheter exit site was secured with a 0-Prolene  retention suture. A dressing was placed. The patient tolerated the procedure well without  immediate post procedural complication. IMPRESSION: Successful placement of a right internal jugular approach 20 cm temporary dialysis catheter with tip terminating with in the superior aspect of the right atrium. The catheter is ready for immediate use. PLAN: This catheter may be converted to a tunneled dialysis catheter at a later date as indicated. Electronically Signed   By: Sandi Mariscal M.D.   On: 02/23/2015 15:11   Ir US Guide Vasc Access Right  02/23/2015  INDICATION: End-stage renal disease. In need of intravenous access for the initiation of dialysis. EXAM: NON-TUNNELED CENTRAL VENOUS HEMODIALYSIS CATHETER PLACEMENT WITH ULTRASOUND AND FLUOROSCOPIC GUIDANCE COMPARISON:  None. MEDICATIONS: None FLUOROSCOPY TIME:  18 seconds (3.9 mGy) COMPLICATIONS: None immediate. PROCEDURE: Informed written consent was obtained from the patient after a discussion of the risks, benefits, and alternatives to treatment. Questions regarding the procedure were encouraged and answered. The right neck and chest were prepped with chlorhexidine in a sterile fashion, and a sterile drape was applied covering the operative field. Maximum barrier sterile technique with sterile gowns and gloves were used for the procedure. A timeout was performed prior to the initiation of the procedure. After creating a small venotomy incision, a micropuncture kit was utilized to access the right internal jugular vein under direct, real-time ultrasound guidance after the overlying soft tissues were anesthetized with 1% lidocaine with epinephrine. Ultrasound image documentation was performed. The microwire was kinked to measure appropriate catheter length. A stiff glidewire was advanced to the level of the IVC. Under fluoroscopic guidance, the venotomy was serially dilated, ultimately allowing placement of a 20 cm temporary Trialysis catheter with tip ultimately terminating within the superior aspect of the right atrium. Final catheter positioning was  confirmed and documented with a spot radiographic image. The catheter aspirates and flushes normally. The catheter was flushed with appropriate volume heparin dwells. The catheter exit site was secured with a 0-Prolene retention suture. A dressing was placed. The patient tolerated the procedure well without immediate post procedural complication. IMPRESSION: Successful placement of a right internal jugular approach 20 cm temporary dialysis catheter with tip terminating with in the superior aspect of the right atrium. The catheter is ready for immediate use. PLAN: This catheter may be converted to a tunneled dialysis catheter at a later date as indicated. Electronically Signed   By: Sandi Mariscal M.D.   On: 02/23/2015 15:11   Dg Chest Port 1 View  02/22/2015  CLINICAL DATA:  Unsuccessful attempt at of right-sided central line placement. Evaluate for pneumothorax. EXAM: PORTABLE CHEST 1 VIEW COMPARISON:  02/21/2015 FINDINGS: There is no evidence of a pneumothorax. Opacity at the right lung base is unchanged, likely atelectasis. No new lung opacities. Endotracheal Tube and nasal/orogastric tube have been removed since the prior exam. IMPRESSION: 1. No evidence of a pneumothorax following central line attempt. 2. Persistent right lung base opacity consistent with atelectasis. Lungs otherwise clear. 3. Status post extubation and removal of the oral/nasogastric tube. Electronically Signed   By: Lajean Manes M.D.   On: 02/22/2015 17:15    Labs: BMET  Recent Labs Lab 02/20/15 0509 02/20/15 1712 02/21/15 0415 02/21/15 1547 02/22/15 0340 02/22/15 1530 02/23/15 1731  NA 133* 132* 133* 136 136 135 134*  K 3.8 4.1 4.1 4.1 4.0 3.7 3.6  CL 91* 89* 89* 90* 93* 93* 90*  CO2 18* 21* _0 GLUCOSE 146* 164* 173* 139* 180* 271* 354*  BUN 34* 38* 46* 54* 55* 72* 99*  CREATININE 3.49* 3.58* 3.65* 3.52* 3.73* 4.83* 7.43*  CALCIUM 7.1* 6.8* 6.9* 7.1* 7.1* 7.1* 7.4*  PHOS 3.6 4.7* 3.6 4.5 4.3 5.2* 7.0*    Anion gap 24 CBC  Recent Labs Lab 02/18/15 0440  02/21/15 0420 02/22/15 0340 02/23/15 0547 02/24/15 0435  WBC 9.5  < > 5.4 7.8 5.7 5.5  NEUTROABS 7.6  --   --   --   --   --   HGB 14.2  < > 8.7* 9.2* 8.2* 9.1*  HCT 41.6  < > 25.3* 27.1* 23.9* 26.5*  MCV 81.6  < > 78.8 78.8 77.3* 77.7*  PLT 182  < > 78* 89* 94* 106*  < > = values in this interval not displayed.  Medications:    . amoxicillin-clavulanate  500 mg Oral QHS  . anticoagulant sodium citrate  5 mL Intravenous Once  . anticoagulant sodium citrate  5 mL Intravenous to XRAY  . antiseptic oral rinse  7 mL Mouth Rinse QID  . chlorhexidine gluconate  15 mL Mouth Rinse BID  . feeding supplement (NEPRO CARB STEADY)  237 mL Oral TID BM  . gabapentin  300 mg Oral QHS  . insulin aspart  0-5 Units Subcutaneous QHS  . insulin aspart  0-9 Units Subcutaneous TID WC  . insulin glargine  10 Units Subcutaneous QHS  . pantoprazole  40 mg Oral Daily  . sodium chloride flush  3 mL Intravenous Q12H     Kathrine Cords, MD [facility] Resident, PGY-2 02/24/2015, 8:22 AM  I have seen and examined this patient and agree with plan per Dr Lorenso Courier.  Rt IJ temp cath.  Fem cath removed.  Still not making any urine.  If labs ok in AM will try to hold off til Mon on HD.  If advance diet then will need renal diet. Jaynee Winters T,MD 02/24/2015 9:40 AM
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
## 2015-02-25 DIAGNOSIS — R109 Unspecified abdominal pain: Secondary | ICD-10-CM

## 2015-02-25 DIAGNOSIS — Z515 Encounter for palliative care: Secondary | ICD-10-CM | POA: Insufficient documentation

## 2015-02-25 DIAGNOSIS — N179 Acute kidney failure, unspecified: Secondary | ICD-10-CM | POA: Insufficient documentation

## 2015-02-25 DIAGNOSIS — N170 Acute kidney failure with tubular necrosis: Secondary | ICD-10-CM

## 2015-02-25 DIAGNOSIS — R1084 Generalized abdominal pain: Secondary | ICD-10-CM

## 2015-02-25 DIAGNOSIS — E1165 Type 2 diabetes mellitus with hyperglycemia: Secondary | ICD-10-CM

## 2015-02-25 LAB — CULTURE, BLOOD (ROUTINE X 2)
Culture: NO GROWTH
Culture: NO GROWTH

## 2015-02-25 LAB — RENAL FUNCTION PANEL
Albumin: 2.3 g/dL — ABNORMAL LOW (ref 3.5–5.0)
Anion gap: 18 — ABNORMAL HIGH (ref 5–15)
BUN: 90 mg/dL — ABNORMAL HIGH (ref 6–20)
CO2: 22 mmol/L (ref 22–32)
Calcium: 7.7 mg/dL — ABNORMAL LOW (ref 8.9–10.3)
Chloride: 94 mmol/L — ABNORMAL LOW (ref 101–111)
Creatinine, Ser: 8.55 mg/dL — ABNORMAL HIGH (ref 0.61–1.24)
GFR calc Af Amer: 7 mL/min — ABNORMAL LOW (ref >60)
GFR calc non Af Amer: 6 mL/min — ABNORMAL LOW (ref >60)
Glucose, Bld: 300 mg/dL — ABNORMAL HIGH (ref 65–99)
Phosphorus: 6.2 mg/dL — ABNORMAL HIGH (ref 2.5–4.6)
Potassium: 4 mmol/L (ref 3.5–5.1)
Sodium: 134 mmol/L — ABNORMAL LOW (ref 135–145)

## 2015-02-25 LAB — GLUCOSE, CAPILLARY
Glucose-Capillary: 283 mg/dL — ABNORMAL HIGH (ref 65–99)
Glucose-Capillary: 258 mg/dL — ABNORMAL HIGH (ref 65–99)
Glucose-Capillary: 273 mg/dL — ABNORMAL HIGH (ref 65–99)
Glucose-Capillary: 300 mg/dL — ABNORMAL HIGH (ref 65–99)

## 2015-02-25 LAB — CBC
HCT: 26.8 % — ABNORMAL LOW (ref 39.0–52.0)
Hemoglobin: 9.3 g/dL — ABNORMAL LOW (ref 13.0–17.0)
MCH: 26.8 pg (ref 26.0–34.0)
MCHC: 34.7 g/dL (ref 30.0–36.0)
MCV: 77.2 fL — ABNORMAL LOW (ref 78.0–100.0)
Platelets: 138 10*3/uL — ABNORMAL LOW (ref 150–400)
RBC: 3.47 MIL/uL — ABNORMAL LOW (ref 4.22–5.81)
RDW: 13.2 % (ref 11.5–15.5)
WBC: 6.5 10*3/uL (ref 4.0–10.5)

## 2015-02-25 MED ORDER — FLUOXETINE HCL 20 MG PO CAPS
20.0000 mg | ORAL_CAPSULE | Freq: Every day | ORAL | Status: DC
  Administered 2015-02-26 – 2015-03-13 (×16): 20 mg via ORAL
  Filled 2015-02-25 (×18): qty 1

## 2015-02-25 MED ORDER — METHADONE HCL 5 MG PO TABS
5.0000 mg | ORAL_TABLET | ORAL | Status: DC | PRN
  Filled 2015-02-25: qty 1

## 2015-02-25 MED ORDER — FENTANYL CITRATE (PF) 100 MCG/2ML IJ SOLN
25.0000 ug | INTRAMUSCULAR | Status: DC | PRN
  Administered 2015-02-25 – 2015-02-26 (×5): 25 ug via INTRAVENOUS
  Filled 2015-02-25 (×7): qty 2

## 2015-02-25 MED ORDER — FENTANYL CITRATE (PF) 100 MCG/2ML IJ SOLN
25.0000 ug | INTRAMUSCULAR | Status: DC | PRN
  Administered 2015-02-25: 25 ug via INTRAVENOUS
  Filled 2015-02-25: qty 2

## 2015-02-25 MED ORDER — SENNOSIDES-DOCUSATE SODIUM 8.6-50 MG PO TABS: 1.0000 | ORAL_TABLET | Freq: Every evening | ORAL | Status: DC | PRN

## 2015-02-25 MED ORDER — INSULIN GLARGINE 100 UNIT/ML ~~LOC~~ SOLN
20.0000 [IU] | Freq: Every day | SUBCUTANEOUS | Status: DC
Start: 2015-02-25 — End: 2015-02-26
  Administered 2015-02-25: 20 [IU] via SUBCUTANEOUS
  Filled 2015-02-25 (×2): qty 0.2

## 2015-02-25 MED ORDER — POLYETHYLENE GLYCOL 3350 17 G PO PACK: 17.0000 g | PACK | Freq: Every day | ORAL | Status: DC
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
## 2015-02-25 NOTE — Progress Notes (Signed)
[name] Progress Note    Assessment/ Plan:   1. Acute renal failure: Possibly hemodynamically mediated with lisinopril/hydrochlorothiazide and metformin overdose. Renal U/S without evidence of hydronephrosis. Anuric. Had first session of intermittent HD on 2/10 and tolerated it well.  R IJ placed on 2/10.  Pre and post dialysis weight 252> 248lb, removed 2L (goal).  - Will do dialysis again on Monday   2. Metformin overdose/toxicity with lactic acidosis: With severe anion gap metabolic acidosis, elevated lactic acid levels and depressed mentation on admission. Normalized lactic acid.   3. Suicide attempt: Status post repair of left slit wrist. Continue HD for metformin toxicity. Off fomepizole. Has sitter at bedside.  4. Hypotension, resolved: s/p  Solu-cortef  5. Hypocalcemia: Resolved with supplementation.   6. Concerns for pneumonia: patient spiked fevers with atelectasis vs infiltrate on CXR, was on vanc and Zosyn, now on Augmentin.   7. Diabetes: Hyperglycemic in the 200s, tighter glycemic control.   Subjective:   Patient "not feeling well." Endorses whole body aches and concerns about kidneys not working. Brother expresses concerns about psych stating he will not go to inpatient psych after d/c.   Objective:   BP 147/55 mmHg  Pulse 67  Temp(Src) 98.4 F (36.9 C) (Oral)  Resp 19  Ht '5\' 9"'$  (1.753 m)  Wt 248 lb 7.3 oz (112.7 kg)  BMI 36.67 kg/m2  SpO2 97%  Intake/Output Summary (Last 24 hours) at 02/25/15 0810 Last data filed at 02/25/15 0600  Gross per 24 hour  Intake    840 ml  Output      0 ml  Net    840 ml   Weight change:   Physical Exam: General: Lying in bed, answering questions appropriately, NAD Cardiovascular: RRR. No murmurs, rubs, or gallops noted.  Respiratory: No increased WOB. CTAB without wheezing, rhonchi, or crackles noted. Abdomen: +BS, soft, non-distended, non-tender. No rigidity.  LE: No pitting edema noted. Neuro: No  asterixis   Imaging: Ir Fluoro Guide Cv Line Right  02/23/2015  INDICATION: End-stage renal disease. In need of intravenous access for the initiation of dialysis. EXAM: NON-TUNNELED CENTRAL VENOUS HEMODIALYSIS CATHETER PLACEMENT WITH ULTRASOUND AND FLUOROSCOPIC GUIDANCE COMPARISON:  None. MEDICATIONS: None FLUOROSCOPY TIME:  18 seconds (3.9 mGy) COMPLICATIONS: None immediate. PROCEDURE: Informed written consent was obtained from the patient after a discussion of the risks, benefits, and alternatives to treatment. Questions regarding the procedure were encouraged and answered. The right neck and chest were prepped with chlorhexidine in a sterile fashion, and a sterile drape was applied covering the operative field. Maximum barrier sterile technique with sterile gowns and gloves were used for the procedure. A timeout was performed prior to the initiation of the procedure. After creating a small venotomy incision, a micropuncture kit was utilized to access the right internal jugular vein under direct, real-time ultrasound guidance after the overlying soft tissues were anesthetized with 1% lidocaine with epinephrine. Ultrasound image documentation was performed. The microwire was kinked to measure appropriate catheter length. A stiff glidewire was advanced to the level of the IVC. Under fluoroscopic guidance, the venotomy was serially dilated, ultimately allowing placement of a 20 cm temporary Trialysis catheter with tip ultimately terminating within the superior aspect of the right atrium. Final catheter positioning was confirmed and documented with a spot radiographic image. The catheter aspirates and flushes normally. The catheter was flushed with appropriate volume heparin dwells. The catheter exit site was secured with a 0-Prolene retention suture. A dressing was placed. The patient tolerated  the procedure well without immediate post procedural complication. IMPRESSION: Successful placement of a right internal  jugular approach 20 cm temporary dialysis catheter with tip terminating with in the superior aspect of the right atrium. The catheter is ready for immediate use. PLAN: This catheter may be converted to a tunneled dialysis catheter at a later date as indicated. Electronically Signed   By: Sandi Mariscal M.D.   On: 02/23/2015 15:11   Ir US Guide Vasc Access Right  02/23/2015  INDICATION: End-stage renal disease. In need of intravenous access for the initiation of dialysis. EXAM: NON-TUNNELED CENTRAL VENOUS HEMODIALYSIS CATHETER PLACEMENT WITH ULTRASOUND AND FLUOROSCOPIC GUIDANCE COMPARISON:  None. MEDICATIONS: None FLUOROSCOPY TIME:  18 seconds (3.9 mGy) COMPLICATIONS: None immediate. PROCEDURE: Informed written consent was obtained from the patient after a discussion of the risks, benefits, and alternatives to treatment. Questions regarding the procedure were encouraged and answered. The right neck and chest were prepped with chlorhexidine in a sterile fashion, and a sterile drape was applied covering the operative field. Maximum barrier sterile technique with sterile gowns and gloves were used for the procedure. A timeout was performed prior to the initiation of the procedure. After creating a small venotomy incision, a micropuncture kit was utilized to access the right internal jugular vein under direct, real-time ultrasound guidance after the overlying soft tissues were anesthetized with 1% lidocaine with epinephrine. Ultrasound image documentation was performed. The microwire was kinked to measure appropriate catheter length. A stiff glidewire was advanced to the level of the IVC. Under fluoroscopic guidance, the venotomy was serially dilated, ultimately allowing placement of a 20 cm temporary Trialysis catheter with tip ultimately terminating within the superior aspect of the right atrium. Final catheter positioning was confirmed and documented with a spot radiographic image. The catheter aspirates and flushes  normally. The catheter was flushed with appropriate volume heparin dwells. The catheter exit site was secured with a 0-Prolene retention suture. A dressing was placed. The patient tolerated the procedure well without immediate post procedural complication. IMPRESSION: Successful placement of a right internal jugular approach 20 cm temporary dialysis catheter with tip terminating with in the superior aspect of the right atrium. The catheter is ready for immediate use. PLAN: This catheter may be converted to a tunneled dialysis catheter at a later date as indicated. Electronically Signed   By: Sandi Mariscal M.D.   On: 02/23/2015 15:11    Labs: BMET  Recent Labs Lab 02/21/15 0415 02/21/15 1547 02/22/15 0340 02/22/15 1530 02/23/15 1731 02/24/15 1224 02/25/15 0638  NA 133* 136 136 135 134* 135 134*  K 4.1 4.1 4.0 3.7 3.6 3.8 4.0  CL 89* 90* 93* 93* 90* 94* 94*  CO2 '23 31 28 26 27 28 22  '$ GLUCOSE 173* 139* 180* 271* 354* 352* 300*  BUN 46* 54* 55* 72* 99* 74* 90*  CREATININE 3.65* 3.52* 3.73* 4.83* 7.43* 7.11* 8.55*  CALCIUM 6.9* 7.1* 7.1* 7.1* 7.4* 7.7* 7.7*  PHOS 3.6 4.5 4.3 5.2* 7.0* 4.7* 6.2*   Anion gap 18 CBC  Recent Labs Lab 02/21/15 0420 02/22/15 0340 02/23/15 0547 02/24/15 0435  WBC 5.4 7.8 5.7 5.5  HGB 8.7* 9.2* 8.2* 9.1*  HCT 25.3* 27.1* 23.9* 26.5*  MCV 78.8 78.8 77.3* 77.7*  PLT 78* 89* 94* 106*    Medications:    . amoxicillin-clavulanate  500 mg Oral QHS  . anticoagulant sodium citrate  5 mL Intravenous Once  . antiseptic oral rinse  7 mL Mouth Rinse QID  . chlorhexidine gluconate  15 mL Mouth Rinse BID  . feeding supplement (NEPRO CARB STEADY)  237 mL Oral TID BM  . FLUoxetine  10 mg Oral Daily  . gabapentin  300 mg Oral QHS  . insulin aspart  0-5 Units Subcutaneous QHS  . insulin aspart  0-9 Units Subcutaneous TID WC  . insulin glargine  10 Units Subcutaneous QHS  . pantoprazole  40 mg Oral Daily  . QUEtiapine  25 mg Oral QHS  . sodium chloride flush  3  mL Intravenous Q12H    Virginia Crews, MD, MPH PGY-2,  [facility] 02/25/2015 8:11 AM I have seen and examined this patient and agree with plan per Dr Brita Romp.  Plan HD tomorrow.  Still no UO.   Expect renal fx to recover at some point. Abbee Cremeens T,MD 02/25/2015 9:08 AM

## 2015-02-26 LAB — GLUCOSE, CAPILLARY
Glucose-Capillary: 207 mg/dL — ABNORMAL HIGH (ref 65–99)
Glucose-Capillary: 200 mg/dL — ABNORMAL HIGH (ref 65–99)
Glucose-Capillary: 200 mg/dL — ABNORMAL HIGH (ref 65–99)
Glucose-Capillary: 226 mg/dL — ABNORMAL HIGH (ref 65–99)

## 2015-02-26 LAB — RENAL FUNCTION PANEL
Albumin: 2.3 g/dL — ABNORMAL LOW (ref 3.5–5.0)
Anion gap: 20 — ABNORMAL HIGH (ref 5–15)
BUN: 102 mg/dL — ABNORMAL HIGH (ref 6–20)
CO2: 22 mmol/L (ref 22–32)
Calcium: 7.8 mg/dL — ABNORMAL LOW (ref 8.9–10.3)
Chloride: 90 mmol/L — ABNORMAL LOW (ref 101–111)
Creatinine, Ser: 10.46 mg/dL — ABNORMAL HIGH (ref 0.61–1.24)
GFR calc Af Amer: 6 mL/min — ABNORMAL LOW (ref >60)
GFR calc non Af Amer: 5 mL/min — ABNORMAL LOW (ref >60)
Glucose, Bld: 234 mg/dL — ABNORMAL HIGH (ref 65–99)
Phosphorus: 7.7 mg/dL — ABNORMAL HIGH (ref 2.5–4.6)
Potassium: 3.8 mmol/L (ref 3.5–5.1)
Sodium: 132 mmol/L — ABNORMAL LOW (ref 135–145)

## 2015-02-26 MED ORDER — OLANZAPINE 5 MG PO TABS
5.0000 mg | ORAL_TABLET | Freq: Every day | ORAL | Status: DC
  Administered 2015-02-26 – 2015-03-12 (×15): 5 mg via ORAL
  Filled 2015-02-26 (×19): qty 1

## 2015-02-26 MED ORDER — FENTANYL 25 MCG/HR TD PT72
50.0000 ug | MEDICATED_PATCH | TRANSDERMAL | Status: DC
  Administered 2015-02-26: 50 ug via TRANSDERMAL
  Filled 2015-02-26: qty 2

## 2015-02-26 MED ORDER — INSULIN GLARGINE 100 UNIT/ML ~~LOC~~ SOLN
25.0000 [IU] | Freq: Every day | SUBCUTANEOUS | Status: DC
Start: 2015-02-26 — End: 2015-03-06
  Administered 2015-02-26 – 2015-03-05 (×8): 25 [IU] via SUBCUTANEOUS
  Filled 2015-02-26 (×9): qty 0.25

## 2015-02-26 MED ORDER — DIAZEPAM 5 MG PO TABS
5.0000 mg | ORAL_TABLET | Freq: Once | ORAL | Status: AC
  Administered 2015-02-26: 5 mg via ORAL
  Filled 2015-02-26: qty 1

## 2015-02-26 MED ORDER — PREGABALIN 75 MG PO CAPS
75.0000 mg | ORAL_CAPSULE | Freq: Every day | ORAL | Status: DC
  Administered 2015-02-26 – 2015-03-12 (×15): 75 mg via ORAL
  Filled 2015-02-26 (×16): qty 1

## 2015-02-26 MED ORDER — ALTEPLASE 2 MG IJ SOLR
2.0000 mg | Freq: Once | INTRAMUSCULAR | Status: AC
  Administered 2015-02-26: 2 mg
  Filled 2015-02-26: qty 2

## 2015-02-26 NOTE — Progress Notes (Signed)
[facility] Progress Note    Assessment/ Plan:   1. Acute renal failure: Possibly hemodynamically mediated with lisinopril/hydrochlorothiazide as well as metformin overdose. Renal U/S without evidence of hydronephrosis. Anuric still. Had first session of intermittent HD on 2/10 and tolerated it well.  R IJ placed on 2/10.   - Will do dialysis again today  2. Metformin overdose/toxicity with lactic acidosis: Resolved. 3. Suicide attempt: Status post repair of left slit wrist. Continue HD for metformin toxicity. Off fomepizole. Has sitter at bedside.  4. Hypotension, resolved: s/p  Solu-cortef  5. Hypocalcemia: Resolved with supplementation.   6. Concerns for pneumonia: patient spiked fevers with atelectasis vs infiltrate on CXR, was on vanc and Zosyn, now on Augmentin, today is day 7 of treatment.    7. Diabetes: Hyperglycemic in the 300-200s, tighter glycemic control warranted.   8. Chronic pain: palliative c/s for pain management, started him on methadone 5mg  q4hrs PRN pain.   Subjective:   Patient doing well. States he urinated some in the toilet this AM, not in a hat or urinal. No pain. Asks about the function of dialysis.    Objective:   BP 162/77 mmHg  Pulse 72  Temp(Src) 98.9 F (37.2 C) (Oral)  Resp 18  Ht 5\' 9"  (1.753 m)  Wt 248 lb 7.3 oz (112.7 kg)  BMI 36.67 kg/m2  SpO2 99%  Intake/Output Summary (Last 24 hours) at 02/26/15 0737 Last data filed at 02/26/15 0600  Gross per 24 hour  Intake    840 ml  Output      0 ml  Net    840 ml   Weight change:   Physical Exam: General: Sitting up in bed, answering questions appropriately, NAD Cardiovascular: RRR. No murmurs, rubs, or gallops noted.  Respiratory: No increased WOB. CTAB without wheezing, rhonchi, or crackles noted. Abdomen: +BS, soft, non-distended, non-tender. No rigidity. Unable to palpate bladder.  LE: No pitting edema noted. Neuro: No asterixis   Imaging: No results  found.  Labs: BMET  Recent Labs Lab 02/21/15 1547 02/22/15 0340 02/22/15 1530 02/23/15 1731 02/24/15 1224 02/25/15 0638 02/26/15 0520  NA 136 136 135 134* 135 134* 132*  K 4.1 4.0 3.7 3.6 3.8 4.0 3.8  CL 90* 93* 93* 90* 94* 94* 90*  CO2 31 28 26 27 28 22 22   GLUCOSE 139* 180* 271* 354* 352* 300* 234*  BUN 54* 55* 72* 99* 74* 90* 102*  CREATININE 3.52* 3.73* 4.83* 7.43* 7.11* 8.55* 10.46*  CALCIUM 7.1* 7.1* 7.1* 7.4* 7.7* 7.7* 7.8*  PHOS 4.5 4.3 5.2* 7.0* 4.7* 6.2* 7.7*   Anion gap 18 CBC  Recent Labs Lab 02/22/15 0340 02/23/15 0547 02/24/15 0435 02/25/15 0827  WBC 7.8 5.7 5.5 6.5  HGB 9.2* 8.2* 9.1* 9.3*  HCT 27.1* 23.9* 26.5* 26.8*  MCV 78.8 77.3* 77.7* 77.2*  PLT 89* 94* 106* 138*    Medications:    . amoxicillin-clavulanate  500 mg Oral QHS  . anticoagulant sodium citrate  5 mL Intravenous Once  . antiseptic oral rinse  7 mL Mouth Rinse QID  . chlorhexidine gluconate  15 mL Mouth Rinse BID  . feeding supplement (NEPRO CARB STEADY)  237 mL Oral TID BM  . FLUoxetine  20 mg Oral Daily  . gabapentin  300 mg Oral QHS  . insulin aspart  0-5 Units Subcutaneous QHS  . insulin aspart  0-9 Units Subcutaneous TID WC  . insulin glargine  20 Units Subcutaneous QHS  . pantoprazole  40  mg Oral Daily  . QUEtiapine  25 mg Oral QHS  . sodium chloride flush  3 mL Intravenous Q12H    Archie Patten, MD  PGY-2,  [facility] 02/26/2015 7:37 AM   Renal Attending: Pt findings as noted above. He remains dialysis dependent and will receive HD today while awaiting recovery of function. Jakyla Reza C

## 2015-02-26 NOTE — Procedures (Signed)
Difficult HD today with arterial side of catheter clotting.  We are terminateing tx 1 hour and 34 minutes into treatment.  Will use TPA and repeat HD in AM.  Shaelin Lalley C

## 2015-02-26 NOTE — Progress Notes (Signed)
Inpatient Diabetes Program Recommendations  AACE/ADA: New Consensus Statement on Inpatient Glycemic Control (2015)  Target Ranges:  Prepandial:   less than 140 mg/dL      Peak postprandial:   less than 180 mg/dL (1-2 hours)      Critically ill patients:  140 - 180 mg/dL   Results for [name], BLOOD (MRN [id number]) as of 02/26/2015 09:13  Ref. Range 02/25/2015 07:23 02/25/2015 13:24 02/25/2015 16:18 02/25/2015 21:13 02/26/2015 07:41  Glucose-Capillary Latest Ref Range: 65-99 mg/dL 283 (H) 258 (H) 273 (H) 300 (H) 207 (H)   Review of Glycemic Control  Diabetes history: DM 2 Outpatient Diabetes medications: Metformin 500 mg BID Current orders for Inpatient glycemic control: Lantus 20 units, Novolog Sensitive + HS  Inpatient Diabetes Program Recommendations: Insulin - Basal: Fasting still 207 after 20 units of basal. May want to consider increasing basal insulin a little more 23-25 units, due to renal function and fasting glucose still being elevated.  Thanks,  Tama Headings RN, MSN, [facility] Inpatient Diabetes Coordinator Team Pager [phone] (8a-5p)
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
## 2015-02-27 DIAGNOSIS — X789XXA Intentional self-harm by unspecified sharp object, initial encounter: Secondary | ICD-10-CM

## 2015-02-27 LAB — RENAL FUNCTION PANEL
Albumin: 2.1 g/dL — ABNORMAL LOW (ref 3.5–5.0)
Albumin: 2.7 g/dL — ABNORMAL LOW (ref 3.5–5.0)
Anion gap: 20 — ABNORMAL HIGH (ref 5–15)
Anion gap: 13 (ref 5–15)
BUN: 90 mg/dL — ABNORMAL HIGH (ref 6–20)
BUN: 43 mg/dL — ABNORMAL HIGH (ref 6–20)
CO2: 23 mmol/L (ref 22–32)
CO2: 24 mmol/L (ref 22–32)
Calcium: 7.7 mg/dL — ABNORMAL LOW (ref 8.9–10.3)
Calcium: 7.8 mg/dL — ABNORMAL LOW (ref 8.9–10.3)
Chloride: 93 mmol/L — ABNORMAL LOW (ref 101–111)
Chloride: 100 mmol/L — ABNORMAL LOW (ref 101–111)
Creatinine, Ser: 10.81 mg/dL — ABNORMAL HIGH (ref 0.61–1.24)
Creatinine, Ser: 6.7 mg/dL — ABNORMAL HIGH (ref 0.61–1.24)
GFR calc Af Amer: 5 mL/min — ABNORMAL LOW (ref >60)
GFR calc Af Amer: 10 mL/min — ABNORMAL LOW (ref >60)
GFR calc non Af Amer: 5 mL/min — ABNORMAL LOW (ref >60)
GFR calc non Af Amer: 8 mL/min — ABNORMAL LOW (ref >60)
Glucose, Bld: 133 mg/dL — ABNORMAL HIGH (ref 65–99)
Glucose, Bld: 179 mg/dL — ABNORMAL HIGH (ref 65–99)
Phosphorus: 8.5 mg/dL — ABNORMAL HIGH (ref 2.5–4.6)
Phosphorus: 4.5 mg/dL (ref 2.5–4.6)
Potassium: 3.8 mmol/L (ref 3.5–5.1)
Potassium: 3.8 mmol/L (ref 3.5–5.1)
Sodium: 136 mmol/L (ref 135–145)
Sodium: 137 mmol/L (ref 135–145)

## 2015-02-27 LAB — GLUCOSE, CAPILLARY
Glucose-Capillary: 136 mg/dL — ABNORMAL HIGH (ref 65–99)
Glucose-Capillary: 132 mg/dL — ABNORMAL HIGH (ref 65–99)
Glucose-Capillary: 251 mg/dL — ABNORMAL HIGH (ref 65–99)
Glucose-Capillary: 258 mg/dL — ABNORMAL HIGH (ref 65–99)

## 2015-02-27 LAB — CBC WITH DIFFERENTIAL/PLATELET
Basophils Absolute: 0 10*3/uL (ref 0.0–0.1)
Basophils Relative: 0 %
Eosinophils Absolute: 0.2 10*3/uL (ref 0.0–0.7)
Eosinophils Relative: 3 %
HCT: 24.4 % — ABNORMAL LOW (ref 39.0–52.0)
Hemoglobin: 8.4 g/dL — ABNORMAL LOW (ref 13.0–17.0)
Lymphocytes Relative: 19 %
Lymphs Abs: 1.5 10*3/uL (ref 0.7–4.0)
MCH: 26.8 pg (ref 26.0–34.0)
MCHC: 34.4 g/dL (ref 30.0–36.0)
MCV: 77.7 fL — ABNORMAL LOW (ref 78.0–100.0)
Monocytes Absolute: 0.6 10*3/uL (ref 0.1–1.0)
Monocytes Relative: 8 %
Neutro Abs: 5.6 10*3/uL (ref 1.7–7.7)
Neutrophils Relative %: 70 %
Platelets: 206 10*3/uL (ref 150–400)
RBC: 3.14 MIL/uL — ABNORMAL LOW (ref 4.22–5.81)
RDW: 13.5 % (ref 11.5–15.5)
WBC: 7.9 10*3/uL (ref 4.0–10.5)

## 2015-02-27 MED ORDER — DIAZEPAM 5 MG PO TABS
5.0000 mg | ORAL_TABLET | Freq: Every day | ORAL | Status: DC
Start: 2015-02-27 — End: 2015-03-13
  Administered 2015-02-27 – 2015-03-12 (×14): 5 mg via ORAL
  Filled 2015-02-27 (×15): qty 1

## 2015-02-27 NOTE — Progress Notes (Signed)
   02/27/15 1100  PT Visit Information  Last PT Received On 02/27/15  Reason Eval/Treat Not Completed Patient at procedure or test/unavailable  Will try to see later if time and pt permit.   Mee Hives, PT MS Acute Rehab Dept. Number: [phone] [id number] and [place] [phone]

## 2015-02-27 NOTE — Progress Notes (Signed)
[facility] Progress Note    Assessment/ Plan:   1. Acute renal failure: Possibly hemodynamically mediated with lisinopril/hydrochlorothiazide as well as metformin overdose. Renal U/S without evidence of hydronephrosis. Anuric still. He had his first session of intermittent HD on 2/10 and tolerated it well.  R IJ placed on 2/10.  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 Palliative wanted renal to consider clonidine for pain/opiate addition. Clonidine is not dialyzable.  - Will use TPA in catheter and complete his dialysis today. - Will try to get back on a M/W/F schedule.   - renal function panel not drawn today.   2. Metformin overdose/toxicity with lactic acidosis: Resolved.  3. Suicide attempt: Status post repair of left slit wrist. Continue HD for metformin toxicity. Off fomepizole. Has sitter at bedside.   4. Hypotension, resolved: s/p  Solu-cortef   5. Hypocalcemia: Resolved with supplementation.    6. Concerns for pneumonia: patient spiked fevers with atelectasis vs infiltrate on CXR, was on vanc and Zosyn, now on Augmentin, today is day 8 of treatment. Primary team to determine duration of treatment.    7. Diabetes: Hyperglycemia is starting to improve.  8. Chronic pain: palliative c/s for pain management. Palliative wanted renal to consider clonidine for pain/opiate addition. Clonidine is not dialyzable.  - will watch BPs today during dialysis to determine if we could start him off on a small dose, 0.1mg  TID. Also slightly concerned about patient's compliance with this medication.   Subjective:   Patient required discontinuation of HD after 1.5hrs due to catheter clotting. Patient has several questions about his renal function, many of them that we have already discussed previously.     Objective:   BP 155/73 mmHg  Pulse 73  Temp(Src) 98.7 F (37.1 C) (Oral)  Resp 18  Ht 5\' 9"  (1.753 m)  Wt 243 lb 9.7 oz (110.5 kg)   BMI 35.96 kg/m2  SpO2 97%  Intake/Output Summary (Last 24 hours) at 02/27/15 0708 Last data filed at 02/27/15 0600  Gross per 24 hour  Intake    240 ml  Output   1397 ml  Net  -1157 ml   Weight change:   Physical Exam: General: Sitting up in bed in NAD.  Cardiovascular: RRR. No murmurs, rubs, or gallops noted.  Respiratory: No increased WOB. CTAB without wheezing, rhonchi, or crackles noted. Abdomen: +BS, soft, non-distended, non-tender.  LE: No pitting edema noted. Neuro: No asterixis   Imaging: No results found.  Labs: BMET  Recent Labs Lab 02/21/15 1547 02/22/15 0340 02/22/15 1530 02/23/15 1731 02/24/15 1224 02/25/15 0638 02/26/15 0520  NA 136 136 135 134* 135 134* 132*  K 4.1 4.0 3.7 3.6 3.8 4.0 3.8  CL 90* 93* 93* 90* 94* 94* 90*  CO2 31 28 26 27 28 22 22   GLUCOSE 139* 180* 271* 354* 352* 300* 234*  BUN 54* 55* 72* 99* 74* 90* 102*  CREATININE 3.52* 3.73* 4.83* 7.43* 7.11* 8.55* 10.46*  CALCIUM 7.1* 7.1* 7.1* 7.4* 7.7* 7.7* 7.8*  PHOS 4.5 4.3 5.2* 7.0* 4.7* 6.2* 7.7*   Anion gap 18 CBC  Recent Labs Lab 02/22/15 0340 02/23/15 0547 02/24/15 0435 02/25/15 0827  WBC 7.8 5.7 5.5 6.5  HGB 9.2* 8.2* 9.1* 9.3*  HCT 27.1* 23.9* 26.5* 26.8*  MCV 78.8 77.3* 77.7* 77.2*  PLT 89* 94* 106* 138*    Medications:    . amoxicillin-clavulanate  500 mg Oral QHS  . anticoagulant sodium citrate  5  mL Intravenous Once  . antiseptic oral rinse  7 mL Mouth Rinse QID  . chlorhexidine gluconate  15 mL Mouth Rinse BID  . feeding supplement (NEPRO CARB STEADY)  237 mL Oral TID BM  . fentaNYL  50 mcg Transdermal Q72H  . FLUoxetine  20 mg Oral Daily  . insulin aspart  0-5 Units Subcutaneous QHS  . insulin aspart  0-9 Units Subcutaneous TID WC  . insulin glargine  25 Units Subcutaneous QHS  . OLANZapine  5 mg Oral QHS  . pantoprazole  40 mg Oral Daily  . pregabalin  75 mg Oral QHS  . sodium chloride flush  3 mL Intravenous Q12H    Archie Patten, MD  PGY-2,   [facility] 02/27/2015 7:08 AM
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
## 2015-02-27 NOTE — Progress Notes (Addendum)
Met with patient to discuss and attempt to align goals of care and pain treatment options in the setting of  his chronic pain, opioid use disorder, renal failure and bipolar disease.  1. Started him on Duragesic 76mg/q72 hours 2. Leave IV for breakthrough pain- for now- other wise dosing needs to be based on functional status not patient self report. 3. Checking a 12 lead EKG- given the multiple serotonin and psych drugs he is on and prior methadone use. Check QT. 4. Started him on xyprexa 523mqhs 5. He has high anxiety- would do well on Diazepam 49m104mHS for sleep and muscle spasm-need to monitor closely for misuse in the outpatient setting-may help attenuate his pain and opiate craving.  6. Consider using Clonidine for pain/opiate addiction- will defer to renal primary team on this given current state of his kidneys-his pressure can tolerate this. 7. Added on QHS lyrica at a renal dose  Pain will be a long term issue for him- he in general has very poor coping. He refuses to be on Methadone.   8. It will be impossible to care for this gentleman in the community unless he is seen and followed by an Addiction Medicine Specialist- very complex needs especially given his mental illness and now renal failure. Could consider Buprenorphine for pain but needs special prescribing for opioid use disorder. At minimum he will need a PCP or Pain clinic with a tight prescription monitoring program for the Duragesic and diazepam.  Hi health insurance is also going to be an issue- I do not think he has any insurance- now medicaid/medicare pending with ESRD/HD.  EliLane HackerO Palliative Medicine [phone]: 7:15PM-750PM Total 35 minutes Greater than 50%  of this time was spent counseling and coordinating care related to the above assessment and plan.

## 2015-02-27 NOTE — Progress Notes (Signed)
Got verbal order from Dr. Sloan Leiter to discontinue Suicide sitter.

## 2015-02-27 NOTE — Procedures (Signed)
Tolerating HD, without hemodynamic issues.  Has some edema.  Remains oliguric [name] [name] C

## 2015-02-27 NOTE — Consult Note (Signed)
[facility] Consult   Reason for Consult:  Bipolar depression and status post suicide attempt by cutting his wrist and OD of non psychotropics Referring Physician:  Dr. Nelda Marseille Patient Identification: [name] [name] Principal Diagnosis: Bipolar I disorder, most recent episode depressed [facility]) Diagnosis:   Patient Active Problem List   Diagnosis Date Noted  . Acute renal failure ([place]) [N17.9]   . Generalized abdominal pain [R10.84]   . Encounter for palliative care [Z51.5]   . Pressure ulcer [L89.90] 02/22/2015  . Bipolar I disorder, most recent episode depressed ([place]) [F31.30] 02/21/2015  . Respiratory failure requiring intubation ([place]) [J96.90]   . Overdose [T50.901A] 02/18/2015  . Acute renal failure (ARF) ([place]) [N17.9] 02/18/2015  . Lactic acidosis [E87.2] 02/18/2015  . Shock circulatory ([place]) [R57.9] 02/18/2015  . Acute respiratory failure with hypoxia ([place]) [J96.01]   . Arterial hypotension [I95.9]   . AKI (acute kidney injury) ([place]) [N17.9]   . Metabolic acidosis [T62.2]   . Type 2 diabetes mellitus with diabetic polyneuropathy ([place]) [E11.42] 06/09/2014  . Depressed bipolar disorder ([place]) [F31.30] 01/01/2014  . Chest pain [R07.9] 12/31/2013  . HERPES ZOSTER [B02.9] 12/05/2008  . SINUSITIS- ACUTE-NOS [J01.90] 12/05/2008  . Hyperlipidemia [E78.5] 05/11/2008  . DEPRESSION [F32.9] 05/11/2008  . Essential hypertension [I10] 05/11/2008  . GERD [K21.9] 05/11/2008  . OSTEOARTHRITIS [M19.90] 05/11/2008  . LOW BACK PAIN [M54.5] 05/11/2008  . COLONIC POLYPS, HX OF [Z86.010] 05/11/2008    Total Time spent with patient: 1 hour  Subjective:   [name] [name] is a 55 y.o. male patient admitted with suicide attempt.  HPI:  [name] [name] is a 55 y/o man seen, chart reviewed for face-to-face psychiatric consultation and evaluation of increased symptoms of bipolar depression and status post suicide attempt by cutting his wrist and also  intentional overdose of prescription medication. Patient reported being depressed, sad, isolated, withdrawn, lack of motivation and having ongoing suicidal thoughts before he acted on them. Patient stated he has been living in his parent's home. Patient reportedly has no previous suicidal attempts. Patient intentionally overdosed his medication lists no prior, but gabapentin and Seroquel. Patient reportedly stopped taking his medication Wellbutrin because he cannot afford to pay $25 every month. Patient reported he has chosen to pay for his daughter car payment instead of buying his medication because his daughter is an graduation school and he care for her. He has had worsening depression and then tonight before bed the patient mentioned to his mother that if he "ever needed to go life support" that he wouldn't want it.   Past Psychiatric History:  Patient has been diagnosed with bipolar disorder and substance abuse and he sees outpatient medication management from [facility] mental health.   Interval history: Patient seen for psychiatric consultation follow-up. Reviewed Dr. Harrington Challenger psychiatric evaluation and follow-up during this weekend. Agreed with psychiatric clearance for outpatient psychiatric services as patient showed improvement in his mental status and compliant with his treatment and agreed to go to the outpatient psychiatric treatment and contract for safety. Patient also has supportive family and friends. Patient has been diagnosed with bipolar disorder and chronic pain management issues. Patient reportedly tapered off his methadone and having a hard time to deal with his emotional problems when he ran out of his medication Wellbutrin which is costing them other medication. Patient reportedly felt depression and made a suicidal attempt by cutting his wrist and overdosing his medication. Patient is being in hospital for medical stability during this time  he has been receiving psychiatric medication  including fluoxetine and Seroquel which helped him to stabilize mood swings and help him to sleep well and relaxed. Patient made a statement "stupidest thing I can do in my life is trying to kill myself and having a lot of source such since being admitted and also reported he cannot be with himself reporting this situation.  Risk to Self: Is patient at risk for suicide?: Yes Risk to Others:   Prior Inpatient Therapy:   Prior Outpatient Therapy:    Past Medical History:  Past Medical History  Diagnosis Date  . Depressed bipolar disorder ([place])   . Hypertension   . Diabetes mellitus without complication ([place])   . Sleep apnea   . Diabetic neuropathy [facility])     Past Surgical History  Procedure Laterality Date  . Spine surgery  2010  . Shoulder arthroscopy with rotator cuff repair Right 2009   Family History:  Family History  Problem Relation Age of Onset  . Diabetes Mother   . Hyperlipidemia Mother   . Heart disease Father    Family Psychiatric  History: unknown Social History:  History  Alcohol Use No     History  Drug Use No    Social History   Social History  . Marital Status: Single    Spouse Name: N/A  . Number of Children: N/A  . Years of Education: N/A   Social History Main Topics  . Smoking status: Never Smoker   . Smokeless tobacco: None  . Alcohol Use: No  . Drug Use: No  . Sexual Activity: Not Asked   Other Topics Concern  . None   Social History Narrative   Additional Social History:    Allergies:   Allergies  Allergen Reactions  . Heparin Other (See Comments)    HIT Ab negative on 02/20/15, but SRA POSITIVE   . Oxytetracycline Rash  . Sulfonamide Derivatives Rash    Labs:  Results for orders placed or performed during the hospital encounter of 02/18/15 (from the past 48 hour(s))  Glucose, capillary     Status: Abnormal   Collection Time: 02/25/15  1:24 PM  Result Value Ref Range   Glucose-Capillary 258 (H) 65 - 99 mg/dL  Glucose,  capillary     Status: Abnormal   Collection Time: 02/25/15  4:18 PM  Result Value Ref Range   Glucose-Capillary 273 (H) 65 - 99 mg/dL  Glucose, capillary     Status: Abnormal   Collection Time: 02/25/15  9:13 PM  Result Value Ref Range   Glucose-Capillary 300 (H) 65 - 99 mg/dL  Renal function panel     Status: Abnormal   Collection Time: 02/26/15  5:20 AM  Result Value Ref Range   Sodium 132 (L) 135 - 145 mmol/L   Potassium 3.8 3.5 - 5.1 mmol/L   Chloride 90 (L) 101 - 111 mmol/L   CO2 22 22 - 32 mmol/L   Glucose, Bld 234 (H) 65 - 99 mg/dL   BUN 102 (H) 6 - 20 mg/dL   Creatinine, Ser 10.46 (H) 0.61 - 1.24 mg/dL   Calcium 7.8 (L) 8.9 - 10.3 mg/dL   Phosphorus 7.7 (H) 2.5 - 4.6 mg/dL   Albumin 2.3 (L) 3.5 - 5.0 g/dL   GFR calc non Af Amer 5 (L) >60 mL/min   GFR calc Af Amer 6 (L) >60 mL/min    Comment: (NOTE) The eGFR has been calculated using the CKD EPI equation. This calculation has not  been validated in all clinical situations. eGFR's persistently <60 mL/min signify possible Chronic Kidney Disease.    Anion gap 20 (H) 5 - 15  Glucose, capillary     Status: Abnormal   Collection Time: 02/26/15  7:41 AM  Result Value Ref Range   Glucose-Capillary 207 (H) 65 - 99 mg/dL   Comment 1 Notify RN   Glucose, capillary     Status: Abnormal   Collection Time: 02/26/15 11:26 AM  Result Value Ref Range   Glucose-Capillary 200 (H) 65 - 99 mg/dL  Glucose, capillary     Status: Abnormal   Collection Time: 02/26/15  5:35 PM  Result Value Ref Range   Glucose-Capillary 200 (H) 65 - 99 mg/dL  Glucose, capillary     Status: Abnormal   Collection Time: 02/26/15  8:28 PM  Result Value Ref Range   Glucose-Capillary 226 (H) 65 - 99 mg/dL   Comment 1 Notify RN   Glucose, capillary     Status: Abnormal   Collection Time: 02/27/15  7:42 AM  Result Value Ref Range   Glucose-Capillary 136 (H) 65 - 99 mg/dL  CBC with Differential/Platelet     Status: Abnormal   Collection Time: 02/27/15  8:30  AM  Result Value Ref Range   WBC 7.9 4.0 - 10.5 K/uL   RBC 3.14 (L) 4.22 - 5.81 MIL/uL   Hemoglobin 8.4 (L) 13.0 - 17.0 g/dL   HCT 24.4 (L) 39.0 - 52.0 %   MCV 77.7 (L) 78.0 - 100.0 fL   MCH 26.8 26.0 - 34.0 pg   MCHC 34.4 30.0 - 36.0 g/dL   RDW 13.5 11.5 - 15.5 %   Platelets 206 150 - 400 K/uL   Neutrophils Relative % 70 %   Neutro Abs 5.6 1.7 - 7.7 K/uL   Lymphocytes Relative 19 %   Lymphs Abs 1.5 0.7 - 4.0 K/uL   Monocytes Relative 8 %   Monocytes Absolute 0.6 0.1 - 1.0 K/uL   Eosinophils Relative 3 %   Eosinophils Absolute 0.2 0.0 - 0.7 K/uL   Basophils Relative 0 %   Basophils Absolute 0.0 0.0 - 0.1 K/uL  Renal function panel     Status: Abnormal   Collection Time: 02/27/15  8:30 AM  Result Value Ref Range   Sodium 136 135 - 145 mmol/L   Potassium 3.8 3.5 - 5.1 mmol/L   Chloride 93 (L) 101 - 111 mmol/L   CO2 23 22 - 32 mmol/L   Glucose, Bld 133 (H) 65 - 99 mg/dL   BUN 90 (H) 6 - 20 mg/dL   Creatinine, Ser 10.81 (H) 0.61 - 1.24 mg/dL   Calcium 7.7 (L) 8.9 - 10.3 mg/dL   Phosphorus 8.5 (H) 2.5 - 4.6 mg/dL   Albumin 2.1 (L) 3.5 - 5.0 g/dL   GFR calc non Af Amer 5 (L) >60 mL/min   GFR calc Af Amer 5 (L) >60 mL/min    Comment: (NOTE) The eGFR has been calculated using the CKD EPI equation. This calculation has not been validated in all clinical situations. eGFR's persistently <60 mL/min signify possible Chronic Kidney Disease.    Anion gap 20 (H) 5 - 15    Current Facility-Administered Medications  Medication Dose Route Frequency Provider Last Rate Last Dose  . 0.9 %  sodium chloride infusion  250 mL Intravenous PRN Luz Brazen, MD 10 mL/hr at 02/21/15 1000 250 mL at 02/21/15 1000  . 0.9 %  sodium chloride infusion  250 mL Intravenous  PRN Aquilla Hacker, MD 10 mL/hr at 02/21/15 2103 250 mL at 02/21/15 2103  . acetaminophen (TYLENOL) tablet 650 mg  650 mg Oral Q6H PRN Modena Jansky, MD   650 mg at 02/25/15 0915  . albuterol (PROVENTIL) (2.5 MG/3ML) 0.083%  nebulizer solution 2.5 mg  2.5 mg Nebulization Q2H PRN Tammy S Parrett, NP      . amoxicillin-clavulanate (AUGMENTIN) 500-125 MG per tablet 500 mg  500 mg Oral QHS Modena Jansky, MD   500 mg at 02/26/15 2121  . antiseptic oral rinse solution (CORINZ)  7 mL Mouth Rinse QID Tammy S Parrett, NP   7 mL at 02/26/15 1600  . chlorhexidine gluconate (PERIDEX) 0.12 % solution 15 mL  15 mL Mouth Rinse BID Tammy S Parrett, NP   15 mL at 02/26/15 0800  . feeding supplement (NEPRO CARB STEADY) liquid 237 mL  237 mL Oral TID BM [place], RD   237 mL at 02/25/15 1437  . fentaNYL (DURAGESIC - dosed mcg/hr) patch 50 mcg  50 mcg Transdermal Q72H Acquanetta Chain, DO   50 mcg at 02/26/15 2122  . fentaNYL (SUBLIMAZE) injection 25 mcg  25 mcg Intravenous Q3H PRN Loistine Chance, MD   25 mcg at 02/26/15 1612  . FLUoxetine (PROZAC) capsule 20 mg  20 mg Oral Daily Cloria Spring, MD   20 mg at 02/26/15 1035  . insulin aspart (novoLOG) injection 0-5 Units  0-5 Units Subcutaneous QHS Anders Simmonds, MD   2 Units at 02/26/15 2127  . insulin aspart (novoLOG) injection 0-9 Units  0-9 Units Subcutaneous TID WC Anders Simmonds, MD   2 Units at 02/26/15 1756  . insulin glargine (LANTUS) injection 25 Units  25 Units Subcutaneous QHS Modena Jansky, MD   25 Units at 02/26/15 2128  . OLANZapine (ZYPREXA) tablet 5 mg  5 mg Oral QHS Acquanetta Chain, DO   5 mg at 02/26/15 2217  . ondansetron (ZOFRAN) injection 4 mg  4 mg Intravenous Q6H PRN Flora Lipps, MD   4 mg at 02/22/15 0133  . pantoprazole (PROTONIX) EC tablet 40 mg  40 mg Oral Daily Modena Jansky, MD   40 mg at 02/26/15 1035  . pregabalin (LYRICA) capsule 75 mg  75 mg Oral QHS Acquanetta Chain, DO   75 mg at 02/26/15 2125  . senna-docusate (Senokot-S) tablet 1 tablet  1 tablet Oral QHS PRN Loistine Chance, MD      . sodium chloride flush (NS) 0.9 % injection 3 mL  3 mL Intravenous Q12H Aquilla Hacker, MD   3 mL at 02/24/15 1045  . sodium chloride flush (NS)  0.9 % injection 3 mL  3 mL Intravenous PRN Aquilla Hacker, MD        Musculoskeletal: Strength & Muscle Tone: decreased Gait & Station: unable to stand Patient leans: N/A  Psychiatric Specialty Exam: ROS   Blood pressure 166/86, pulse 68, temperature 98.9 F (37.2 C), temperature source Oral, resp. rate 20, height _0  (1.753 m), weight 110.4 kg (243 lb 6.2 oz), SpO2 98 %.Body mass index is 35.93 kg/(m^2).  General Appearance: Disheveled but pleasant and cooperative   Engineer, water::  Fair  Speech:  Clear and Coherent  Volume:  Normal  Mood:  Anxious   Affect:  A little brighter  Thought Process:  Coherent and Goal Directed  Orientation:  Full (Time, Place, and Person)  Thought Content:  WDL  Suicidal Thoughts:  denies suicidal intent or plan   Homicidal Thoughts:  No  Memory:  Immediate;   Fair Recent;   Fair  Judgement:  Fair  Insight:  Fair  Psychomotor Activity:  Normal  Concentration:  Fair  Recall:  [employer] of Knowledge:Good  Language: Good  Akathisia:  Negative  Handed:  Right  AIMS (if indicated):     Assets:  Communication Skills Desire for Improvement Housing Leisure Time Resilience Social Support Transportation  ADL's:  Impaired  Cognition: WNL  Sleep:      Treatment Plan Summary: Patient has been doing better with her current medication therapy Patient has multiple visits status including his son at his bed side Patient is willing to participate outpatient medication management Patient contract for safety and denies active and current suicidal ideation Patient regrets for his suicidal behaviors and attempts Patient is willing to stay with his medication management and follow-up with physician at [facility] We will discontinue safety sitter as patient contract for safety Continue current psychotropic medications without changes Prozac for depression and Seroquel mood swings and insomnia.  Disposition: Patient can be discharged to outpatient  psychiatric medication management at [facility] mental health when medically stable May contact units social worker for disposition plan and arrangements.  Durward Parcel., MD 02/27/2015 1:13 PM

## 2015-02-28 DIAGNOSIS — G894 Chronic pain syndrome: Secondary | ICD-10-CM

## 2015-02-28 LAB — RENAL FUNCTION PANEL
Albumin: 2.3 g/dL — ABNORMAL LOW (ref 3.5–5.0)
Anion gap: 12 (ref 5–15)
BUN: 49 mg/dL — ABNORMAL HIGH (ref 6–20)
CO2: 24 mmol/L (ref 22–32)
Calcium: 7.5 mg/dL — ABNORMAL LOW (ref 8.9–10.3)
Chloride: 99 mmol/L — ABNORMAL LOW (ref 101–111)
Creatinine, Ser: 8.46 mg/dL — ABNORMAL HIGH (ref 0.61–1.24)
GFR calc Af Amer: 7 mL/min — ABNORMAL LOW (ref >60)
GFR calc non Af Amer: 6 mL/min — ABNORMAL LOW (ref >60)
Glucose, Bld: 160 mg/dL — ABNORMAL HIGH (ref 65–99)
Phosphorus: 6.3 mg/dL — ABNORMAL HIGH (ref 2.5–4.6)
Potassium: 4.1 mmol/L (ref 3.5–5.1)
Sodium: 135 mmol/L (ref 135–145)

## 2015-02-28 LAB — GLUCOSE, CAPILLARY
Glucose-Capillary: 153 mg/dL — ABNORMAL HIGH (ref 65–99)
Glucose-Capillary: 215 mg/dL — ABNORMAL HIGH (ref 65–99)
Glucose-Capillary: 279 mg/dL — ABNORMAL HIGH (ref 65–99)
Glucose-Capillary: 244 mg/dL — ABNORMAL HIGH (ref 65–99)

## 2015-02-28 MED ORDER — NEPRO/CARBSTEADY PO LIQD
237.0000 mL | Freq: Two times a day (BID) | ORAL | Status: DC
  Administered 2015-03-01 – 2015-03-12 (×9): 237 mL via ORAL

## 2015-02-28 NOTE — Clinical Social Work Psych Note (Signed)
Psych CSW confirmed appointment with Beverly Sessions on March 13th 2017 at 10:20am.  This information was placed on the patient's discharge summary for patient convenience.  Nonnie Done, LCSW [phone]  5N1-9; 2S 15-16 and [place] Licensed Clinical Social Worker
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
## 2015-02-28 NOTE — Progress Notes (Signed)
[facility] Progress Note    Assessment/ Plan:   1. Acute renal failure: Possibly hemodynamically mediated with lisinopril/hydrochlorothiazide as well as metformin overdose. Renal U/S without evidence of hydronephrosis. Anuric still. He had his first session of intermittent HD on 2/10.  R IJ placed on 2/10. Odd that Cr increased from 6.7>8.46 since HD. No other significant electrolyte abnormality. Patient with some UOP yesterday. - Will try to get back on a M/W/F schedule. -  Continue to monitor for UOP.   2. Metformin overdose/toxicity with lactic acidosis: Resolved.  3. Suicide attempt: Status post repair of left slit wrist. Continue HD for metformin toxicity. Off fomepizole. Bedside sitter was discontinued.   4. Hypotension, resolved: s/p  Solu-cortef   5. Hypocalcemia: Ca corrects to 8.5.   6. Concerns for pneumonia: patient spiked fevers with atelectasis vs infiltrate on CXR, was on vanc and Zosyn, now on Augmentin, today is day  of treatment. Primary team to determine duration of treatment.    7. Diabetes: Hyperglycemia up to the 250s.   8. Chronic pain: palliative c/s for pain management. Palliative wanted renal to consider clonidine for pain/opiate addition. Patient doing well with Duragesic and Lyrica. Would consider holding off on clonidine unless you're attempting to wean him off these medications.   9. Hyperphosphatemia: Phosphorus up to 6.3. Ok in the short term, if remains elevated consider Oscal or phos-Lo.   Subjective:   Patient tolerated HD yesterday. He had 450cc urine output.    Objective:   BP 154/81 mmHg  Pulse 73  Temp(Src) 99 F (37.2 C) (Oral)  Resp 17  Ht 5\' 9"  (1.753 m)  Wt 243 lb 6.2 oz (110.4 kg)  BMI 35.93 kg/m2  SpO2 100%  Intake/Output Summary (Last 24 hours) at 02/28/15 0837 Last data filed at 02/28/15 0600  Gross per 24 hour  Intake   1524 ml  Output   2450 ml  Net   -926 ml   Weight change: 1 lb 12.2 oz (0.8  kg)  Physical Exam: General: Sitting up in bedside chair in NAD.  Cardiovascular: RRR. No murmurs, rubs, or gallops noted.  Respiratory: No increased WOB. CTAB without wheezing, rhonchi, or crackles noted. Abdomen: +BS, soft, non-distended, non-tender.  LE: No pitting edema noted. Neuro: No asterixis   Imaging: No results found.  Labs: BMET  Recent Labs Lab 02/23/15 1731 02/24/15 1224 02/25/15 0638 02/26/15 0520 02/27/15 0830 02/27/15 1514 02/28/15 0649  NA 134* 135 134* 132* 136 137 135  K 3.6 3.8 4.0 3.8 3.8 3.8 4.1  CL 90* 94* 94* 90* 93* 100* 99*  CO2 27 28 22 22 23 24 24   GLUCOSE 354* 352* 300* 234* 133* 179* 160*  BUN 99* 74* 90* 102* 90* 43* 49*  CREATININE 7.43* 7.11* 8.55* 10.46* 10.81* 6.70* 8.46*  CALCIUM 7.4* 7.7* 7.7* 7.8* 7.7* 7.8* 7.5*  PHOS 7.0* 4.7* 6.2* 7.7* 8.5* 4.5 6.3*   Anion gap 18 CBC  Recent Labs Lab 02/23/15 0547 02/24/15 0435 02/25/15 0827 02/27/15 0830  WBC 5.7 5.5 6.5 7.9  NEUTROABS  --   --   --  5.6  HGB 8.2* 9.1* 9.3* 8.4*  HCT 23.9* 26.5* 26.8* 24.4*  MCV 77.3* 77.7* 77.2* 77.7*  PLT 94* 106* 138* 206    Medications:    . amoxicillin-clavulanate  500 mg Oral QHS  . antiseptic oral rinse  7 mL Mouth Rinse QID  . chlorhexidine gluconate  15 mL Mouth Rinse BID  . diazepam  5 mg  Oral QHS  . feeding supplement (NEPRO CARB STEADY)  237 mL Oral TID BM  . fentaNYL  50 mcg Transdermal Q72H  . FLUoxetine  20 mg Oral Daily  . insulin aspart  0-5 Units Subcutaneous QHS  . insulin aspart  0-9 Units Subcutaneous TID WC  . insulin glargine  25 Units Subcutaneous QHS  . OLANZapine  5 mg Oral QHS  . pantoprazole  40 mg Oral Daily  . pregabalin  75 mg Oral QHS  . sodium chloride flush  3 mL Intravenous Q12H    Archie Patten, MD  PGY-2,  [facility] 02/28/2015 8:37 AM   Renal Attending; Pt remains with rising creat but increased UOP.  Will try to observe off HD and see what happens. Malynn Lucy C

## 2015-02-28 NOTE — Progress Notes (Signed)
Inpatient Diabetes Program Recommendations  AACE/ADA: New Consensus Statement on Inpatient Glycemic Control (2015)  Target Ranges:  Prepandial:   less than 140 mg/dL      Peak postprandial:   less than 180 mg/dL (1-2 hours)      Critically ill patients:  140 - 180 mg/dL   Results for [name] [name], [name] [name] (MRN [id number]) as of 02/28/2015 10:46  Ref. Range 02/27/2015 07:42 02/27/2015 14:29 02/27/2015 16:44 02/27/2015 22:14 02/28/2015 07:43  Glucose-Capillary Latest Ref Range: 65-99 mg/dL 136 (H) 132 (H) 251 (H) 258 (H) 153 (H)   Review of Glycemic Control  Diabetes history: DM 2 Current orders for Inpatient glycemic control: Latnus 25 units, Novolog Sensitive + HS scale  Inpatient Diabetes Program Recommendations:  Correction (SSI): Glucose increased into the 250's for dinner and HS checks. May want to consider increasing correction to Novolog Moderate TID.  Thanks,  Tama Headings RN, MSN, [facility] Inpatient Diabetes Coordinator Team Pager [phone] (8a-5p)

## 2015-02-28 NOTE — Progress Notes (Signed)
Physical Therapy Treatment Patient Details Name: AMAHRI BONGO MRN: [id number] DOB: 1960/02/09 Today's Date: 02/28/2015    History of Present Illness 55 yo male with bipolar disorder was admitted after a suicide attempt, overdose and cut his wrist.  Had metabolic acidosis, elevated lactate, AKI with elevated creatinine. Has had his temporary femoral HD catheter removed and can now have PT visit.       PT Comments    Making very good progress. Still with some dynamic balance difficulty during gait when challenged but overall much improved. Safely completed stair training. Feel a cane will be adequate for support during ambulation and he reports having several at home. Also states he plans to stay with his brother and sister-in-law at d/c. Will continued to follow and progress while admitted.  Follow Up Recommendations  Supervision for mobility/OOB;Outpatient PT     Equipment Recommendations  Rolling walker with 5" wheels    Recommendations for Other Services       Precautions / Restrictions Precautions Precautions: Fall Restrictions Weight Bearing Restrictions: No    Mobility  Bed Mobility Overal bed mobility: Modified Independent             General bed mobility comments: extra time  Transfers Overall transfer level: Needs assistance Equipment used: None Transfers: Sit to/from Stand Sit to Stand: Supervision         General transfer comment: Supervision for safety. Min sway noted but able to self correct without assistive device.  Ambulation/Gait Ambulation/Gait assistance: Min guard Ambulation Distance (Feet): 190 Feet Assistive device: None Gait Pattern/deviations: Step-through pattern;Decreased stride length;Scissoring;Staggering right Gait velocity: reduced Gait velocity interpretation: Below normal speed for age/gender General Gait Details: Occasional stagger towards right with intermittent scissoring. VC for awareness and safety. Tolerated higher  level dynamic gait tasks with some difficulty see DGI below. Cues to improve balance. Backwards stepping very guarded but without need for assist. Stepping over object required min assist for balance during first trial.   Stairs Stairs: Yes Stairs assistance: Supervision Stair Management: Two rails;Alternating pattern;Forwards Number of Stairs: 6 General stair comments: VC for sequencing, prefers alternating step. Mild reliance on rails for support. No buckling or loss of balance requiring assist.  Wheelchair Mobility    Modified Rankin (Stroke Patients Only)       Balance                  DGI score 13                  Cognition Arousal/Alertness: Awake/alert Behavior During Therapy: Flat affect Overall Cognitive Status: Within Functional Limits for tasks assessed                      Exercises Other Exercises Other Exercises: Encouraged to perform long arc quads and ankle pumps    General Comments General comments (skin integrity, edema, etc.): States he will now be staying with his brother and sister-in-law at d/c.      Pertinent Vitals/Pain Pain Assessment: No/denies pain    Home Living                      Prior Function            PT Goals (current goals can now be found in the care plan section) Acute Rehab PT Goals Patient Stated Goal: none stated PT Goal Formulation: With patient Time For Goal Achievement: 03/10/15 Potential to Achieve Goals: Good Progress towards PT goals: Progressing  toward goals    Frequency  Min 3X/week    PT Plan Current plan remains appropriate    Co-evaluation             End of Session Equipment Utilized During Treatment: Gait belt Activity Tolerance: Patient tolerated treatment well Patient left: in chair;with call bell/phone within reach     Time: [id number] PT Time Calculation (min) (ACUTE ONLY): 8 min  Charges:  $Gait Training: 8-22 mins                    G Codes:       Ellouise Newer March 06, 2015, 2:32 PM  Camille Bal Glide, [place]

## 2015-02-28 NOTE — Progress Notes (Signed)
Nutrition Follow-up  DOCUMENTATION CODES:   Obesity unspecified  INTERVENTION:   - Provide patient with Nepro po BID. - Continue encouraging good po's.  NUTRITION DIAGNOSIS:   Inadequate oral intake related to poor appetite as evidenced by per patient/family report, meal completion < 50%.  Ongoing  GOAL:   Patient will meet greater than or equal to 90% of their needs  Progressing  MONITOR:   Diet advancement, Supplement acceptance, PO intake, Weight trends  ASSESSMENT:   55 y/o man with bipolar d/o who attempted suicide with polysubstance overdose and slit wrist  2/8 - extubated 2/9 - diet advanced to soft diet 2/10 - pt currently undergoing intermittent HD  Pt seen for follow up.  Pt reports poor appetite.  Consumed eggs, toast, and grits for breakfast with 100% meal completion documented.  Pt was eating pasta, carrots, and a breadstick at the time of visit.  State that this food tasted pretty good, but the food was not always the most appetizing making him not want to eat.  Reports that the food often makes him feel gassy.  Pt reports that he consumes Nepro 1x per day.    Medications reviewed.  Labs reviewed: CBGs 153 - 215, elevated phosphorus 6.3, BUN/creatinine elevated.   Dietetic Intern encouraged patient to continue consuming Nepro and maintain good po intake to help meet needs/wound healing.  Due to increased meal consumptions, Nepro order will be reduced from TID to BID.    Diet Order:  DIET SOFT Room service appropriate?: Yes; Fluid consistency:: Thin  Skin:  Wound (see comment) (Stage I pressure ulcer on bony prominence; laceration on L wrist )  Last BM:  2/14  Height:   Ht Readings from Last 1 Encounters:  02/18/15 5\' 9"  (1.753 m)    Weight:   Wt Readings from Last 1 Encounters:  02/27/15 243 lb 6.2 oz (110.4 kg)    Ideal Body Weight:  72.7 kg  BMI:  Body mass index is 35.93 kg/(m^2).  Estimated Nutritional Needs:   Kcal:   2200-2400  Protein:  110-120 gm  Fluid:  Per MD  EDUCATION NEEDS:   No education needs identified at this time  Veronda Prude, Dietetic Intern Pager: [phone]

## 2015-02-28 NOTE — Progress Notes (Signed)
Palliative Medicine RN Note: visited pt to confirm pain is well-managed. RN reports that fentanyl patch is on and that pt got acetaminophen for breakthrough pain; patient verbalized relief. PM RN visited patient who was sleeping with even, deep respirations. Provided contact number to staff RN in case patient has needs unmet by current orders. Plan f/u by PM team tomorrow.  Larina Earthly, RN, BSN, [facility] 02/28/2015 12:57 PM

## 2015-03-01 LAB — GLUCOSE, CAPILLARY
Glucose-Capillary: 166 mg/dL — ABNORMAL HIGH (ref 65–99)
Glucose-Capillary: 244 mg/dL — ABNORMAL HIGH (ref 65–99)
Glucose-Capillary: 308 mg/dL — ABNORMAL HIGH (ref 65–99)
Glucose-Capillary: 279 mg/dL — ABNORMAL HIGH (ref 65–99)

## 2015-03-01 LAB — RENAL FUNCTION PANEL
Albumin: 2.5 g/dL — ABNORMAL LOW (ref 3.5–5.0)
Anion gap: 14 (ref 5–15)
BUN: 61 mg/dL — ABNORMAL HIGH (ref 6–20)
CO2: 23 mmol/L (ref 22–32)
Calcium: 7.7 mg/dL — ABNORMAL LOW (ref 8.9–10.3)
Chloride: 98 mmol/L — ABNORMAL LOW (ref 101–111)
Creatinine, Ser: 10.2 mg/dL — ABNORMAL HIGH (ref 0.61–1.24)
GFR calc Af Amer: 6 mL/min — ABNORMAL LOW (ref >60)
GFR calc non Af Amer: 5 mL/min — ABNORMAL LOW (ref >60)
Glucose, Bld: 152 mg/dL — ABNORMAL HIGH (ref 65–99)
Phosphorus: 8 mg/dL — ABNORMAL HIGH (ref 2.5–4.6)
Potassium: 4 mmol/L (ref 3.5–5.1)
Sodium: 135 mmol/L (ref 135–145)

## 2015-03-01 MED ORDER — FENTANYL 50 MCG/HR TD PT72
50.0000 ug | MEDICATED_PATCH | TRANSDERMAL | Status: DC
  Administered 2015-03-01 – 2015-03-13 (×8): 50 ug via TRANSDERMAL
  Filled 2015-03-01 (×8): qty 1

## 2015-03-01 MED ORDER — FUROSEMIDE 80 MG PO TABS
160.0000 mg | ORAL_TABLET | Freq: Two times a day (BID) | ORAL | Status: DC
  Administered 2015-03-01 – 2015-03-10 (×17): 160 mg via ORAL
  Filled 2015-03-01 (×18): qty 2

## 2015-03-01 NOTE — Progress Notes (Signed)
Inpatient Diabetes Program Recommendations  AACE/ADA: New Consensus Statement on Inpatient Glycemic Control (2015)  Target Ranges:  Prepandial:   less than 140 mg/dL      Peak postprandial:   less than 180 mg/dL (1-2 hours)      Critically ill patients:  140 - 180 mg/dL   Results for [name] [name], [name] [name] (MRN [id number]) as of 03/01/2015 10:14  Ref. Range 02/28/2015 07:43 02/28/2015 11:55 02/28/2015 18:58 02/28/2015 22:01 03/01/2015 07:33  Glucose-Capillary Latest Ref Range: 65-99 mg/dL 153 (H) 215 (H) 279 (H) 244 (H) 166 (H)   Review of Glycemic Control  Diabetes history: DM 2 Current orders for Inpatient glycemic control: Lantus 25 units, Novolog Sensitive + HS scale  Inpatient Diabetes Program Recommendations:  Correction (SSI): Glucose increased into the 250's for dinner and HS checks. If appropriate, may want to consider increasing correction to Novolog Moderate TID.  Thanks,  Tama Headings RN, MSN, [facility] Inpatient Diabetes Coordinator Team Pager [phone] (8a-5p)

## 2015-03-01 NOTE — Progress Notes (Signed)
Assessment/ Plan:   1. Acute renal failure: hemodynamically mediated with lisinopril/hydrochlorothiazide as well as metformin overdose. Now nonoliguric with rising creatinine.  Plan to hold dialysis again and follow  2. Metformin overdose/toxicity with lactic acidosis: Resolved.  3. Suicide attempt: Status post repair of left slit wrist.        Subjective: Interval History: OK today w/o uremic S/S  Objective: Vital signs in last 24 hours: Temp:  [98.4 F (36.9 C)-99.2 F (37.3 C)] 99.2 F (37.3 C) (02/15 2035) Pulse Rate:  [72-77] 73 (02/15 2035) Resp:  [16-18] 16 (02/15 2035) BP: (149-173)/(69-83) 173/77 mmHg (02/15 2035) SpO2:  [99 %-100 %] 99 % (02/15 2035) Weight change:   Intake/Output from previous day: 02/15 0701 - 02/16 0700 In: 720 [P.O.:720] Out: 325 [Urine:325] Intake/Output this shift: Total I/O In: -  Out: 150 [Urine:150]  General appearance: alert and cooperative Resp: clear to auscultation bilaterally Chest wall: no tenderness Cardio: regular rate and rhythm, S1, S2 normal, no murmur, click, rub or gallop Extremities: edema 1+  Lab Results:  Recent Labs  02/27/15 0830  WBC 7.9  HGB 8.4*  HCT 24.4*  PLT 206   BMET:  Recent Labs  02/28/15 0649 03/01/15 0522  NA 135 135  K 4.1 4.0  CL 99* 98*  CO2 24 23  GLUCOSE 160* 152*  BUN 49* 61*  CREATININE 8.46* 10.20*  CALCIUM 7.5* 7.7*   No results for input(s): PTH in the last 72 hours. Iron Studies: No results for input(s): IRON, TIBC, TRANSFERRIN, FERRITIN in the last 72 hours. Studies/Results: No results found.  Scheduled: . antiseptic oral rinse  7 mL Mouth Rinse QID  . chlorhexidine gluconate  15 mL Mouth Rinse BID  . diazepam  5 mg Oral QHS  . feeding supplement (NEPRO CARB STEADY)  237 mL Oral BID BM  . fentaNYL  50 mcg Transdermal Q48H  . FLUoxetine  20 mg Oral Daily  . insulin aspart  0-5 Units Subcutaneous QHS  . insulin aspart  0-9 Units Subcutaneous TID WC  . insulin  glargine  25 Units Subcutaneous QHS  . OLANZapine  5 mg Oral QHS  . pantoprazole  40 mg Oral Daily  . pregabalin  75 mg Oral QHS  . sodium chloride flush  3 mL Intravenous Q12H      LOS: 11 days   Darlisha Kelm C 03/01/2015,9:59 AM
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
## 2015-03-01 NOTE — Progress Notes (Addendum)
Palliative Care RN f/u: pt is awake and eating breakfast. He reports that he is having "real bad back pain." He states that the fentanyl patch was effective initially but seemed to wear off yesterday evening (around 48 hours). RN confirms that patient is due for patch change tonight. Waiting for call back from Dr Hilma Favors to discuss symptom mgmt with her.  Dr Hilma Favors returned call; new order to change patch q48 hours. Confirmed with pharmacy that pt's dose is 50 mcg.  Larina Earthly, RN, BSN, [facility] 03/01/2015 9:09 AM

## 2015-03-02 LAB — RENAL FUNCTION PANEL
Albumin: 2.5 g/dL — ABNORMAL LOW (ref 3.5–5.0)
Anion gap: 17 — ABNORMAL HIGH (ref 5–15)
BUN: 72 mg/dL — ABNORMAL HIGH (ref 6–20)
CO2: 18 mmol/L — ABNORMAL LOW (ref 22–32)
Calcium: 7.6 mg/dL — ABNORMAL LOW (ref 8.9–10.3)
Chloride: 96 mmol/L — ABNORMAL LOW (ref 101–111)
Creatinine, Ser: 12.09 mg/dL — ABNORMAL HIGH (ref 0.61–1.24)
GFR calc Af Amer: 5 mL/min — ABNORMAL LOW (ref >60)
GFR calc non Af Amer: 4 mL/min — ABNORMAL LOW (ref >60)
Glucose, Bld: 272 mg/dL — ABNORMAL HIGH (ref 65–99)
Phosphorus: 9.3 mg/dL — ABNORMAL HIGH (ref 2.5–4.6)
Potassium: 4.2 mmol/L (ref 3.5–5.1)
Sodium: 131 mmol/L — ABNORMAL LOW (ref 135–145)

## 2015-03-02 LAB — GLUCOSE, CAPILLARY
Glucose-Capillary: 159 mg/dL — ABNORMAL HIGH (ref 65–99)
Glucose-Capillary: 253 mg/dL — ABNORMAL HIGH (ref 65–99)
Glucose-Capillary: 306 mg/dL — ABNORMAL HIGH (ref 65–99)
Glucose-Capillary: 268 mg/dL — ABNORMAL HIGH (ref 65–99)
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
## 2015-03-02 NOTE — Progress Notes (Signed)
Removed 4 staples from left wrist.  Patient tolerated well.  Will put steri-strips for approximation of the edges, Dr is aware.

## 2015-03-02 NOTE — Progress Notes (Signed)
Inpatient Diabetes Program Recommendations  AACE/ADA: New Consensus Statement on Inpatient Glycemic Control (2015)  Target Ranges:  Prepandial:   less than 140 mg/dL      Peak postprandial:   less than 180 mg/dL (1-2 hours)      Critically ill patients:  140 - 180 mg/dL   Results for NAGUAN, MAZON (MRN [id number]) as of 03/02/2015 10:25  Ref. Range 03/01/2015 07:33 03/01/2015 11:49 03/01/2015 16:03 03/01/2015 21:28  Glucose-Capillary Latest Ref Range: 65-99 mg/dL 166 (H) 244 (H) 308 (H) 279 (H)    Current Insulin Orders: Lantus 25 units QHS      Novolog Sensitive SSI (0-9 units) TID AC + HS scale      MD- Please consider increasing Novolog SSI to Moderate scale (0-15 units) TID AC + HS    --Will follow patient during hospitalization--  Wyn Quaker RN, MSN, CDE Diabetes Coordinator Inpatient Glycemic Control Team Team Pager: [phone] (8a-5p)

## 2015-03-02 NOTE — Progress Notes (Signed)
BP 170/77 this evening.  Asymptomatic.  Notified PA on-call.  No new orders at this time.  Will continue to monitor.

## 2015-03-02 NOTE — Progress Notes (Signed)
Palliative Medicine RN Note: Pt denies breakthrough pain. Plan for PM to follow for pain management until d/c plan in place. Larina Earthly, RN, BSN, [facility] 03/02/2015 10:02 AM Cell [phone] 8:00-4:00 Monday-Friday Office [phone]

## 2015-03-02 NOTE — Progress Notes (Signed)
Physical Therapy Treatment and Discharge Patient Details Name: WINTHROP SHANNAHAN MRN: [id number] DOB: 1960-11-19 Today's Date: 03/02/2015    History of Present Illness 55 yo male with bipolar disorder was admitted after a suicide attempt, overdose and cut his wrist.  Had metabolic acidosis, elevated lactate, AKI with elevated creatinine. Has had his temporary femoral HD catheter removed and can now have PT visit.       PT Comments    Patient has completed physical therapy goals. Safely ambulating in hallway, tolerating higher level dynamic gait tasks, safely navigating steps, and shows a significant improvement in Dynamic Gait Index score. All education has been completed and pt has no further questions. Adequate for D/c from PT standpoint when medically ready. Staying with brother at d/c and states he feels confident in his functional abilities. Signing-off. Thank you for this referral.  Follow Up Recommendations  Outpatient PT;Supervision - Intermittent     Equipment Recommendations  None recommended by PT    Recommendations for Other Services       Precautions / Restrictions Precautions Precautions: Fall Restrictions Weight Bearing Restrictions: No    Mobility  Bed Mobility Overal bed mobility: Independent                Transfers Overall transfer level: Independent                  Ambulation/Gait Ambulation/Gait assistance: Modified independent (Device/Increase time) Ambulation Distance (Feet): 225 Feet Assistive device: None Gait Pattern/deviations: Step-through pattern;Decreased stance time - right Gait velocity: reduced Gait velocity interpretation: Below normal speed for age/gender General Gait Details: Minimal sway with decreased stance time on Rt. No overt loss of balance with higher level dynamic gait challenges including head turns, variable speeds, quick turns, and high marching. No device needed.   Stairs Stairs: Yes Stairs assistance:  Supervision Stair Management: No rails;Step to pattern;Forwards Number of Stairs: 3 General stair comments: Without rails, supervision for safety. Challenging but completed without loss of balance or need to grasp rail.  Wheelchair Mobility    Modified Rankin (Stroke Patients Only)       Balance Overall balance assessment: Modified Independent               Single Leg Stance - Right Leg: 6 Single Leg Stance - Left Leg: 2     Rhomberg - Eyes Opened: 60 Rhomberg - Eyes Closed: 60 (increased sway but self corrects)        Cognition Arousal/Alertness: Awake/alert Behavior During Therapy: WFL for tasks assessed/performed Overall Cognitive Status: Within Functional Limits for tasks assessed                      Exercises      General Comments General comments (skin integrity, edema, etc.): Significant improvement in DGI.       Pertinent Vitals/Pain Pain Assessment: No/denies pain    Home Living                      Prior Function            PT Goals (current goals can now be found in the care plan section) Acute Rehab PT Goals Patient Stated Goal: none stated PT Goal Formulation: With patient Time For Goal Achievement: 03/10/15 Potential to Achieve Goals: Good Progress towards PT goals: Goals met/education completed, patient discharged from PT    Frequency  Min 3X/week    PT Plan Equipment recommendations need to be updated  Co-evaluation             End of Session   Activity Tolerance: Patient tolerated treatment well Patient left: in chair;with call bell/phone within reach     Time: 6144-3154 PT Time Calculation (min) (ACUTE ONLY): 12 min  Charges:  $Gait Training: 8-22 mins                    G Codes:      Ellouise Newer 03/24/15, 4:05 PM  Camille Bal [place], [place]

## 2015-03-02 NOTE — Progress Notes (Signed)
Assessment/ Plan:   1. Acute renal failure, nonoliguric: hemodynamically mediated with lisinopril/hydrochlorothiazide as well as metformin overdose. Now nonoliguric with rising creatinine.  Plan to hold dialysis again and follow  2. Suicide attempt: Status post repair of left slit wrist.   Plan : Will eval Sat for possible dialysisagain         Subjective: Interval History: Some increased UOP  Objective: Vital signs in last 24 hours: Temp:  [98.1 F (36.7 C)-99.2 F (37.3 C)] 98.1 F (36.7 C) (02/17 0812) Pulse Rate:  [74-76] 76 (02/17 0812) Resp:  [17-18] 17 (02/17 0812) BP: (138-178)/(69-107) 167/82 mmHg (02/17 0812) SpO2:  [99 %-100 %] 100 % (02/17 0812) Weight change:   Intake/Output from previous day: 02/16 0701 - 02/17 0700 In: 840 [P.O.:840] Out: 675 [Urine:675] Intake/Output this shift: Total I/O In: -  Out: 200 [Urine:200]  General appearance: alert and cooperative Back: symmetric, no curvature. ROM normal. No CVA tenderness., tr presacral edema Resp: clear to auscultation bilaterally Chest wall: no tenderness Extremities: edema 1-2+   Lab Results: No results for input(s): WBC, HGB, HCT, PLT in the last 72 hours. BMET:  Recent Labs  02/28/15 0649 03/01/15 0522  NA 135 135  K 4.1 4.0  CL 99* 98*  CO2 24 23  GLUCOSE 160* 152*  BUN 49* 61*  CREATININE 8.46* 10.20*  CALCIUM 7.5* 7.7*   No results for input(s): PTH in the last 72 hours. Iron Studies: No results for input(s): IRON, TIBC, TRANSFERRIN, FERRITIN in the last 72 hours. Studies/Results: No results found.  Scheduled: . antiseptic oral rinse  7 mL Mouth Rinse QID  . chlorhexidine gluconate  15 mL Mouth Rinse BID  . diazepam  5 mg Oral QHS  . feeding supplement (NEPRO CARB STEADY)  237 mL Oral BID BM  . fentaNYL  50 mcg Transdermal Q48H  . FLUoxetine  20 mg Oral Daily  . furosemide  160 mg Oral BID  . insulin aspart  0-5 Units Subcutaneous QHS  . insulin aspart  0-9 Units  Subcutaneous TID WC  . insulin glargine  25 Units Subcutaneous QHS  . OLANZapine  5 mg Oral QHS  . pantoprazole  40 mg Oral Daily  . pregabalin  75 mg Oral QHS  . sodium chloride flush  3 mL Intravenous Q12H     LOS: 12 days   Rilda Bulls C 03/02/2015,11:19 AM

## 2015-03-03 DIAGNOSIS — I10 Essential (primary) hypertension: Secondary | ICD-10-CM

## 2015-03-03 LAB — GLUCOSE, CAPILLARY
Glucose-Capillary: 133 mg/dL — ABNORMAL HIGH (ref 65–99)
Glucose-Capillary: 268 mg/dL — ABNORMAL HIGH (ref 65–99)
Glucose-Capillary: 228 mg/dL — ABNORMAL HIGH (ref 65–99)

## 2015-03-03 MED ORDER — SODIUM CHLORIDE 0.9 % IV SOLN: 100.0000 mL | INTRAVENOUS | Status: DC | PRN

## 2015-03-03 MED ORDER — HEPARIN SODIUM (PORCINE) 1000 UNIT/ML DIALYSIS: 1000.0000 [IU] | INTRAMUSCULAR | Status: DC | PRN

## 2015-03-03 MED ORDER — LIDOCAINE-PRILOCAINE 2.5-2.5 % EX CREA
1.0000 "application " | TOPICAL_CREAM | CUTANEOUS | Status: DC | PRN
  Filled 2015-03-03: qty 5

## 2015-03-03 MED ORDER — PENTAFLUOROPROP-TETRAFLUOROETH EX AERO
1.0000 | INHALATION_SPRAY | CUTANEOUS | Status: DC | PRN
Start: 2015-03-03 — End: 2015-03-13

## 2015-03-03 MED ORDER — AMLODIPINE BESYLATE 5 MG PO TABS
5.0000 mg | ORAL_TABLET | Freq: Every day | ORAL | Status: DC
Start: 2015-03-03 — End: 2015-03-11
  Administered 2015-03-04 – 2015-03-11 (×8): 5 mg via ORAL
  Filled 2015-03-03 (×11): qty 1

## 2015-03-03 MED ORDER — ANTICOAGULANT SODIUM CITRATE 4% (200MG/5ML) IV SOLN
5.0000 mL | Freq: Once | Status: AC
  Administered 2015-03-04: 5 mL via INTRAVENOUS
  Filled 2015-03-03 (×2): qty 250

## 2015-03-03 MED ORDER — HEPARIN SODIUM (PORCINE) 1000 UNIT/ML DIALYSIS: 20.0000 [IU]/kg | INTRAMUSCULAR | Status: DC | PRN

## 2015-03-03 MED ORDER — ALTEPLASE 2 MG IJ SOLR
2.0000 mg | Freq: Once | INTRAMUSCULAR | Status: DC | PRN
  Filled 2015-03-03: qty 2

## 2015-03-03 MED ORDER — LIDOCAINE HCL (PF) 1 % IJ SOLN: 5.0000 mL | INTRAMUSCULAR | Status: DC | PRN

## 2015-03-03 NOTE — Progress Notes (Signed)
Assessment/ Plan:   1. Acute renal failure, nonoliguric: hemodynamically mediated with lisinopril/hydrochlorothiazide as well as metformin overdose. Now nonoliguric with rising creatinine.  Plan to hold dialysis again and follow  2. Suicide attempt: Status post repair of left slit wrist.   Plan : Will do dialysisagaintoday             Subjective: Interval History: he thinks fentanykl patch bothering him some  Objective: Vital signs in last 24 hours: Temp:  [98.3 F (36.8 C)-99.8 F (37.7 C)] 98.3 F (36.8 C) (02/18 0815) Pulse Rate:  [78-82] 82 (02/18 0815) Resp:  [17-18] 18 (02/18 0815) BP: (155-171)/(77-90) 171/86 mmHg (02/18 0815) SpO2:  [98 %-100 %] 100 % (02/18 0815) Weight change:   Intake/Output from previous day: 02/17 0701 - 02/18 0700 In: 1440 [P.O.:1440] Out: 520 [Urine:520] Intake/Output this shift: Total I/O In: 240 [P.O.:240] Out: 225 [Urine:225]  General appearance: alert and cooperative GI: protuberant anasarca  Lab Results: No results for input(s): WBC, HGB, HCT, PLT in the last 72 hours. BMET:  Recent Labs  03/01/15 0522 03/02/15 1045  NA 135 131*  K 4.0 4.2  CL 98* 96*  CO2 23 18*  GLUCOSE 152* 272*  BUN 61* 72*  CREATININE 10.20* 12.09*  CALCIUM 7.7* 7.6*   No results for input(s): PTH in the last 72 hours. Iron Studies: No results for input(s): IRON, TIBC, TRANSFERRIN, FERRITIN in the last 72 hours. Studies/Results: No results found.  Scheduled: . amLODipine  5 mg Oral Daily  . antiseptic oral rinse  7 mL Mouth Rinse QID  . chlorhexidine gluconate  15 mL Mouth Rinse BID  . diazepam  5 mg Oral QHS  . feeding supplement (NEPRO CARB STEADY)  237 mL Oral BID BM  . fentaNYL  50 mcg Transdermal Q48H  . FLUoxetine  20 mg Oral Daily  . furosemide  160 mg Oral BID  . insulin aspart  0-5 Units Subcutaneous QHS  . insulin aspart  0-9 Units Subcutaneous TID WC  . insulin glargine  25 Units Subcutaneous QHS  . OLANZapine  5 mg  Oral QHS  . pantoprazole  40 mg Oral Daily  . pregabalin  75 mg Oral QHS  . sodium chloride flush  3 mL Intravenous Q12H     LOS: 13 days   Areesha Dehaven C 03/03/2015,10:59 AM
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
## 2015-03-04 LAB — GLUCOSE, CAPILLARY
Glucose-Capillary: 219 mg/dL — ABNORMAL HIGH (ref 65–99)
Glucose-Capillary: 278 mg/dL — ABNORMAL HIGH (ref 65–99)
Glucose-Capillary: 279 mg/dL — ABNORMAL HIGH (ref 65–99)
Glucose-Capillary: 265 mg/dL — ABNORMAL HIGH (ref 65–99)
Glucose-Capillary: 275 mg/dL — ABNORMAL HIGH (ref 65–99)

## 2015-03-04 LAB — RENAL FUNCTION PANEL
Albumin: 2.5 g/dL — ABNORMAL LOW (ref 3.5–5.0)
Anion gap: 15 (ref 5–15)
BUN: 57 mg/dL — ABNORMAL HIGH (ref 6–20)
CO2: 23 mmol/L (ref 22–32)
Calcium: 7.8 mg/dL — ABNORMAL LOW (ref 8.9–10.3)
Chloride: 99 mmol/L — ABNORMAL LOW (ref 101–111)
Creatinine, Ser: 10.35 mg/dL — ABNORMAL HIGH (ref 0.61–1.24)
GFR calc Af Amer: 6 mL/min — ABNORMAL LOW (ref >60)
GFR calc non Af Amer: 5 mL/min — ABNORMAL LOW (ref >60)
Glucose, Bld: 260 mg/dL — ABNORMAL HIGH (ref 65–99)
Phosphorus: 8.1 mg/dL — ABNORMAL HIGH (ref 2.5–4.6)
Potassium: 4 mmol/L (ref 3.5–5.1)
Sodium: 137 mmol/L (ref 135–145)

## 2015-03-04 MED ORDER — CYCLOBENZAPRINE HCL 10 MG PO TABS
10.0000 mg | ORAL_TABLET | Freq: Once | ORAL | Status: AC
  Administered 2015-03-04: 10 mg via ORAL
  Filled 2015-03-04: qty 1

## 2015-03-04 MED ORDER — HYDROCODONE-ACETAMINOPHEN 5-325 MG PO TABS
2.0000 | ORAL_TABLET | Freq: Once | ORAL | Status: AC
  Administered 2015-03-04: 2 via ORAL
  Filled 2015-03-04: qty 2

## 2015-03-04 NOTE — Progress Notes (Signed)
Assessment/ Plan:   1. Acute renal failure, nonoliguric: hemodynamically mediated with lisinopril/hydrochlorothiazide as well as metformin overdose. Now nonoliguric with rising creatinine.   2. Suicide attempt: Status post repair of left slit wrist.   Plan : Will eval for HD in AM                 Subjective: Interval History: HD last PM with 4000cc removed.  Heaviness in chest gone now.  Objective: Vital signs in last 24 hours: Temp:  [98.4 F (36.9 C)-99.2 F (37.3 C)] 98.4 F (36.9 C) (02/19 1000) Pulse Rate:  [74-99] 99 (02/19 1000) Resp:  [18-20] 20 (02/19 1000) BP: (121-187)/(63-99) 155/79 mmHg (02/19 1000) SpO2:  [98 %-99 %] 98 % (02/19 1000) Weight:  [113.2 kg (249 lb 9 oz)-116.7 kg (257 lb 4.4 oz)] 113.2 kg (249 lb 9 oz) (02/19 0057) Weight change:   Intake/Output from previous day: 02/18 0701 - 02/19 0700 In: 480 [P.O.:480] Out: 4025 [Urine:525] Intake/Output this shift: Total I/O In: 600 [P.O.:600] Out: 565 [Urine:565]  General appearance: alert and cooperative Resp: clear to auscultation bilaterally Chest wall: no tenderness Cardio: regular rate and rhythm, S1, S2 normal, no murmur, click, rub or gallop GI: soft, non-tender; bowel sounds normal; no masses,  no organomegaly Extremities: edema 2+ bilat  Lab Results: No results for input(s): WBC, HGB, HCT, PLT in the last 72 hours. BMET:  Recent Labs  03/02/15 1045 03/04/15 0440  NA 131* 137  K 4.2 4.0  CL 96* 99*  CO2 18* 23  GLUCOSE 272* 260*  BUN 72* 57*  CREATININE 12.09* 10.35*  CALCIUM 7.6* 7.8*   No results for input(s): PTH in the last 72 hours. Iron Studies: No results for input(s): IRON, TIBC, TRANSFERRIN, FERRITIN in the last 72 hours. Studies/Results: No results found.  Scheduled: . amLODipine  5 mg Oral Daily  . antiseptic oral rinse  7 mL Mouth Rinse QID  . chlorhexidine gluconate  15 mL Mouth Rinse BID  . diazepam  5 mg Oral QHS  . feeding supplement (NEPRO CARB  STEADY)  237 mL Oral BID BM  . fentaNYL  50 mcg Transdermal Q48H  . FLUoxetine  20 mg Oral Daily  . furosemide  160 mg Oral BID  . insulin aspart  0-5 Units Subcutaneous QHS  . insulin aspart  0-9 Units Subcutaneous TID WC  . insulin glargine  25 Units Subcutaneous QHS  . OLANZapine  5 mg Oral QHS  . pantoprazole  40 mg Oral Daily  . pregabalin  75 mg Oral QHS  . sodium chloride flush  3 mL Intravenous Q12H      LOS: 14 days   [name] [name] C 03/04/2015,4:09 PM
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
## 2015-03-05 LAB — RENAL FUNCTION PANEL
Albumin: 2.7 g/dL — ABNORMAL LOW (ref 3.5–5.0)
Anion gap: 16 — ABNORMAL HIGH (ref 5–15)
BUN: 72 mg/dL — ABNORMAL HIGH (ref 6–20)
CO2: 21 mmol/L — ABNORMAL LOW (ref 22–32)
Calcium: 7.9 mg/dL — ABNORMAL LOW (ref 8.9–10.3)
Chloride: 101 mmol/L (ref 101–111)
Creatinine, Ser: 12.39 mg/dL — ABNORMAL HIGH (ref 0.61–1.24)
GFR calc Af Amer: 5 mL/min — ABNORMAL LOW (ref >60)
GFR calc non Af Amer: 4 mL/min — ABNORMAL LOW (ref >60)
Glucose, Bld: 244 mg/dL — ABNORMAL HIGH (ref 65–99)
Phosphorus: 9.4 mg/dL — ABNORMAL HIGH (ref 2.5–4.6)
Potassium: 4.6 mmol/L (ref 3.5–5.1)
Sodium: 138 mmol/L (ref 135–145)

## 2015-03-05 LAB — BASIC METABOLIC PANEL
Anion gap: 16 — ABNORMAL HIGH (ref 5–15)
BUN: 71 mg/dL — ABNORMAL HIGH (ref 6–20)
CO2: 21 mmol/L — ABNORMAL LOW (ref 22–32)
Calcium: 7.9 mg/dL — ABNORMAL LOW (ref 8.9–10.3)
Chloride: 102 mmol/L (ref 101–111)
Creatinine, Ser: 12.3 mg/dL — ABNORMAL HIGH (ref 0.61–1.24)
GFR calc Af Amer: 5 mL/min — ABNORMAL LOW (ref >60)
GFR calc non Af Amer: 4 mL/min — ABNORMAL LOW (ref >60)
Glucose, Bld: 243 mg/dL — ABNORMAL HIGH (ref 65–99)
Potassium: 4.5 mmol/L (ref 3.5–5.1)
Sodium: 139 mmol/L (ref 135–145)

## 2015-03-05 LAB — GLUCOSE, CAPILLARY
Glucose-Capillary: 186 mg/dL — ABNORMAL HIGH (ref 65–99)
Glucose-Capillary: 254 mg/dL — ABNORMAL HIGH (ref 65–99)
Glucose-Capillary: 303 mg/dL — ABNORMAL HIGH (ref 65–99)
Glucose-Capillary: 310 mg/dL — ABNORMAL HIGH (ref 65–99)

## 2015-03-05 MED ORDER — CALCIUM ACETATE (PHOS BINDER) 667 MG PO CAPS
1334.0000 mg | ORAL_CAPSULE | Freq: Three times a day (TID) | ORAL | Status: DC
  Administered 2015-03-05 – 2015-03-12 (×19): 1334 mg via ORAL
  Filled 2015-03-05 (×19): qty 2

## 2015-03-05 MED ORDER — CHLORHEXIDINE GLUCONATE 0.12 % MT SOLN
OROMUCOSAL | Status: AC
  Administered 2015-03-05: 15 mL
  Filled 2015-03-05: qty 15

## 2015-03-05 MED ORDER — TRAMADOL HCL 50 MG PO TABS
50.0000 mg | ORAL_TABLET | Freq: Four times a day (QID) | ORAL | Status: DC | PRN
  Administered 2015-03-05 – 2015-03-09 (×3): 50 mg via ORAL
  Filled 2015-03-05 (×3): qty 1
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
## 2015-03-05 NOTE — Progress Notes (Signed)
Inpatient Diabetes Program Recommendations  AACE/ADA: New Consensus Statement on Inpatient Glycemic Control (2015)  Target Ranges:  Prepandial:   less than 140 mg/dL      Peak postprandial:   less than 180 mg/dL (1-2 hours)      Critically ill patients:  140 - 180 mg/dL   Review of Glycemic Control  Inpatient Diabetes Program Recommendations:  Glucose running in 200's-300's   Please consider an increase in correction to moderate tidwc or add 3 units meal coverage to the sensitive correction tidwc. Novolog Meal coverage would be held if patient eats less than 50% of meal.  Thank you Rosita Kea, RN, MSN, CDE  Diabetes Inpatient Program Office: [phone] Pager: [phone] 8:00 am to 5:00 pm

## 2015-03-05 NOTE — Progress Notes (Signed)
Subjective:  Hemodynamically stable- 865 of UOP yest- creatinine rising off of HD - last HD was Saturday  Objective Vital signs in last 24 hours: Filed Vitals:   03/04/15 1705 03/04/15 2134 03/05/15 0514 03/05/15 0835  BP: 150/81 128/62 118/59 145/74  Pulse: 91 91 88 77  Temp: 98.4 F (36.9 C)  98.4 F (36.9 C) 97.3 F (36.3 C)  TempSrc: Oral Oral Oral Oral  Resp: 18 20 16 17   Height:      Weight:  114 kg (251 lb 5.2 oz)    SpO2: 98% 96% 96% 98%   Weight change: -2.166 kg (-4 lb 12.4 oz)  Intake/Output Summary (Last 24 hours) at 03/05/15 1429 Last data filed at 03/05/15 1241  Gross per 24 hour  Intake   1080 ml  Output    500 ml  Net    580 ml    Assessment/ Plan: Pt is a 55 y.o. yo male with bipolar d/o, chronic pain, DM, HTN  who was admitted on 02/18/2015 with s/p suicide attempt with slit wrist and O/D of DM and BP meds as well as gabapentin - creatinine normal in October 2016 Assessment/Plan: 1. Renal- Done Friday 2/10, Monday 2/13, Tuesday 2/14  and Saturday 2/18- marginal UOP BUN up by 20 and creatinine up by 2 since last HD- will plan on doing tomorrow Tuesday- has been HD dependent in some form since 2/5- tunneled HD cath placed on 2/10.  Will send papers to get OP spot for acute HD  2. HTN/volume- seems OK- on amlodipine, lasix 3. Anemia- hgb 8.4- will need to check iron stores and start an ESA 4. Bones- check intact PTH and start phoslo for phos binding     Yulia Ulrich A    Labs: Basic Metabolic Panel:  Recent Labs Lab 03/02/15 1045 03/04/15 0440 03/05/15 0655  NA 131* 137 138  139  K 4.2 4.0 4.6  4.5  CL 96* 99* 101  102  CO2 18* 23 21*  21*  GLUCOSE 272* 260* 244*  243*  BUN 72* 57* 72*  71*  CREATININE 12.09* 10.35* 12.39*  12.30*  CALCIUM 7.6* 7.8* 7.9*  7.9*  PHOS 9.3* 8.1* 9.4*   Liver Function Tests:  Recent Labs Lab 03/02/15 1045 03/04/15 0440 03/05/15 0655  ALBUMIN 2.5* 2.5* 2.7*   No results for input(s): LIPASE,  AMYLASE in the last 168 hours. No results for input(s): AMMONIA in the last 168 hours. CBC:  Recent Labs Lab 02/27/15 0830  WBC 7.9  NEUTROABS 5.6  HGB 8.4*  HCT 24.4*  MCV 77.7*  PLT 206   Cardiac Enzymes: No results for input(s): CKTOTAL, CKMB, CKMBINDEX, TROPONINI in the last 168 hours. CBG:  Recent Labs Lab 03/04/15 1141 03/04/15 1602 03/04/15 2132 03/05/15 0743 03/05/15 1143  GLUCAP 279* 265* 275* 186* 254*    Iron Studies: No results for input(s): IRON, TIBC, TRANSFERRIN, FERRITIN in the last 72 hours. Studies/Results: No results found. Medications: Infusions:    Scheduled Medications: . amLODipine  5 mg Oral Daily  . antiseptic oral rinse  7 mL Mouth Rinse QID  . chlorhexidine gluconate  15 mL Mouth Rinse BID  . diazepam  5 mg Oral QHS  . feeding supplement (NEPRO CARB STEADY)  237 mL Oral BID BM  . fentaNYL  50 mcg Transdermal Q48H  . FLUoxetine  20 mg Oral Daily  . furosemide  160 mg Oral BID  . insulin aspart  0-5 Units Subcutaneous QHS  . insulin aspart  0-9 Units Subcutaneous TID WC  . insulin glargine  25 Units Subcutaneous QHS  . OLANZapine  5 mg Oral QHS  . pantoprazole  40 mg Oral Daily  . pregabalin  75 mg Oral QHS  . sodium chloride flush  3 mL Intravenous Q12H    have reviewed scheduled and prn medications.  Physical Exam: General: NAD Heart: RRR Lungs: clear Abdomen: soft, non tender Extremities: pitting edema Dialysis Access: right IJ PC     03/05/2015,2:29 PM  LOS: 15 days

## 2015-03-06 LAB — GLUCOSE, CAPILLARY
Glucose-Capillary: 139 mg/dL — ABNORMAL HIGH (ref 65–99)
Glucose-Capillary: 224 mg/dL — ABNORMAL HIGH (ref 65–99)
Glucose-Capillary: 317 mg/dL — ABNORMAL HIGH (ref 65–99)

## 2015-03-06 LAB — RENAL FUNCTION PANEL
Albumin: 3 g/dL — ABNORMAL LOW (ref 3.5–5.0)
Anion gap: 17 — ABNORMAL HIGH (ref 5–15)
BUN: 80 mg/dL — ABNORMAL HIGH (ref 6–20)
CO2: 21 mmol/L — ABNORMAL LOW (ref 22–32)
Calcium: 8.5 mg/dL — ABNORMAL LOW (ref 8.9–10.3)
Chloride: 102 mmol/L (ref 101–111)
Creatinine, Ser: 13.64 mg/dL — ABNORMAL HIGH (ref 0.61–1.24)
GFR calc Af Amer: 4 mL/min — ABNORMAL LOW (ref >60)
GFR calc non Af Amer: 4 mL/min — ABNORMAL LOW (ref >60)
Glucose, Bld: 152 mg/dL — ABNORMAL HIGH (ref 65–99)
Phosphorus: 10.6 mg/dL — ABNORMAL HIGH (ref 2.5–4.6)
Potassium: 4.4 mmol/L (ref 3.5–5.1)
Sodium: 140 mmol/L (ref 135–145)

## 2015-03-06 LAB — CBC
HCT: 18.6 % — ABNORMAL LOW (ref 39.0–52.0)
Hemoglobin: 6.3 g/dL — CL (ref 13.0–17.0)
MCH: 27.5 pg (ref 26.0–34.0)
MCHC: 33.9 g/dL (ref 30.0–36.0)
MCV: 81.2 fL (ref 78.0–100.0)
Platelets: 207 10*3/uL (ref 150–400)
RBC: 2.29 MIL/uL — ABNORMAL LOW (ref 4.22–5.81)
RDW: 14.3 % (ref 11.5–15.5)
WBC: 5.3 10*3/uL (ref 4.0–10.5)

## 2015-03-06 LAB — IRON AND TIBC
Iron: 15 ug/dL — ABNORMAL LOW (ref 45–182)
Saturation Ratios: 5 % — ABNORMAL LOW (ref 17.9–39.5)
TIBC: 298 ug/dL (ref 250–450)
UIBC: 283 ug/dL

## 2015-03-06 LAB — PREPARE RBC (CROSSMATCH)

## 2015-03-06 LAB — ABO/RH: ABO/RH(D): A POS

## 2015-03-06 MED ORDER — DARBEPOETIN ALFA 150 MCG/0.3ML IJ SOSY: 150.0000 ug | PREFILLED_SYRINGE | INTRAMUSCULAR | Status: DC

## 2015-03-06 MED ORDER — ANTICOAGULANT SODIUM CITRATE 4% (200MG/5ML) IV SOLN
5.0000 mL | Status: AC
  Filled 2015-03-06 (×2): qty 250

## 2015-03-06 MED ORDER — SODIUM CHLORIDE 0.9 % IV SOLN: Freq: Once | INTRAVENOUS | Status: DC

## 2015-03-06 MED ORDER — INSULIN GLARGINE 100 UNIT/ML ~~LOC~~ SOLN
35.0000 [IU] | Freq: Every day | SUBCUTANEOUS | Status: DC
  Administered 2015-03-06: 35 [IU] via SUBCUTANEOUS
  Filled 2015-03-06 (×2): qty 0.35

## 2015-03-06 NOTE — Procedures (Signed)
Patient was seen on dialysis and the procedure was supervised.  BFR 300  Via PC BP is  116/58.   Patient appears to be tolerating treatment well  [name] [name] A 03/06/2015

## 2015-03-06 NOTE — Progress Notes (Signed)
Patient appears to be doing well in terms of pain management and psychosis. Recommend continuing his current regimen upon discharge. He is seen in the community wellness clinic. He will need long term prescribing and prescription drug monitoring. PMT will sign off. Please re-consult if we can be of any further assistance.  Lane Hacker, DO Palliative Medicine [phone]
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
## 2015-03-06 NOTE — Progress Notes (Signed)
Subjective:  Hemodynamically stable- 800 of UOP yest- creatinine rising off of HD - last HD was Saturday - numbers worse needs today  Objective Vital signs in last 24 hours: Filed Vitals:   03/05/15 1733 03/05/15 2100 03/06/15 0428 03/06/15 1000  BP: 128/65 111/55 138/81 116/58  Pulse: 82 103 86 76  Temp: 98 F (36.7 C) 98.7 F (37.1 C) 98.9 F (37.2 C) 98.6 F (37 C)  TempSrc: Oral Oral Oral Oral  Resp: 17 18 20 20   Height:      Weight:   113.535 kg (250 lb 4.8 oz)   SpO2: 99% 97% 96% 96%   Weight change: -0.465 kg (-1 lb 0.4 oz)  Intake/Output Summary (Last 24 hours) at 03/06/15 1114 Last data filed at 03/06/15 0900  Gross per 24 hour  Intake    780 ml  Output    600 ml  Net    180 ml    Assessment/ Plan: Pt is a 55 y.o. yo male with bipolar d/o, chronic pain, DM, HTN  who was admitted on 02/18/2015 with s/p suicide attempt with slit wrist and O/D of DM and BP meds as well as gabapentin - creatinine normal in October 2016 Assessment/Plan: 1. Renal- HD Done Friday 2/10, Monday 2/13, Tuesday 2/14  and Saturday 2/18- marginal UOP BUN up by 20 and creatinine up by 3.6 since last HD- BUN 80 and crt 13.6 will plan on doing HD today- has been HD dependent in some form since 2/5- tunneled HD cath placed on 2/10.  Will send papers to get OP spot for acute HD  2. HTN/volume- seems OK- on amlodipine, lasix 3. Anemia- hgb 8.4 on 2/14- will need to check iron stores and start an ESA 4. Bones- check intact PTH and start phoslo for phos binding     Thressa Shiffer A    Labs: Basic Metabolic Panel:  Recent Labs Lab 03/04/15 0440 03/05/15 0655 03/06/15 0812  NA 137 138  139 140  K 4.0 4.6  4.5 4.4  CL 99* 101  102 102  CO2 23 21*  21* 21*  GLUCOSE 260* 244*  243* 152*  BUN 57* 72*  71* 80*  CREATININE 10.35* 12.39*  12.30* 13.64*  CALCIUM 7.8* 7.9*  7.9* 8.5*  PHOS 8.1* 9.4* 10.6*   Liver Function Tests:  Recent Labs Lab 03/04/15 0440 03/05/15 0655  03/06/15 0812  ALBUMIN 2.5* 2.7* 3.0*   No results for input(s): LIPASE, AMYLASE in the last 168 hours. No results for input(s): AMMONIA in the last 168 hours. CBC: No results for input(s): WBC, NEUTROABS, HGB, HCT, MCV, PLT in the last 168 hours. Cardiac Enzymes: No results for input(s): CKTOTAL, CKMB, CKMBINDEX, TROPONINI in the last 168 hours. CBG:  Recent Labs Lab 03/05/15 0743 03/05/15 1143 03/05/15 1722 03/05/15 2236 03/06/15 0754  GLUCAP 186* 254* 303* 310* 139*    Iron Studies: No results for input(s): IRON, TIBC, TRANSFERRIN, FERRITIN in the last 72 hours. Studies/Results: No results found. Medications: Infusions:    Scheduled Medications: . amLODipine  5 mg Oral Daily  . calcium acetate  1,334 mg Oral TID WC  . chlorhexidine gluconate  15 mL Mouth Rinse BID  . diazepam  5 mg Oral QHS  . feeding supplement (NEPRO CARB STEADY)  237 mL Oral BID BM  . fentaNYL  50 mcg Transdermal Q48H  . FLUoxetine  20 mg Oral Daily  . furosemide  160 mg Oral BID  . insulin aspart  0-5 Units Subcutaneous QHS  .  insulin aspart  0-9 Units Subcutaneous TID WC  . insulin glargine  25 Units Subcutaneous QHS  . OLANZapine  5 mg Oral QHS  . pantoprazole  40 mg Oral Daily  . pregabalin  75 mg Oral QHS  . sodium chloride flush  3 mL Intravenous Q12H    have reviewed scheduled and prn medications.  Physical Exam: General: NAD Heart: RRR Lungs: clear Abdomen: soft, non tender Extremities: pitting edema Dialysis Access: right IJ PC     03/06/2015,11:14 AM  LOS: 16 days

## 2015-03-06 NOTE — Progress Notes (Signed)
CRITICAL VALUE ALERT  Critical value received:  Hgb 6.3  Date of notification:  03/06/15  Time of notification:  [id number]  Critical value read back:Yes.    Nurse who received alert:  Larena Glassman, RN  MD notified (1st page):  Dr. Moshe Cipro  Time of first page:  1237  MD notified (2nd page):  Time of second page:  Responding MD:  Dr. Moshe Cipro  Time MD responded:  1239    Patient currently in HD. Dr. Moshe Cipro stated she would write orders. Dr. Candiss Norse also notified.  Joellen Jersey, RN.

## 2015-03-06 NOTE — Progress Notes (Signed)
Nutrition Follow-up  DOCUMENTATION CODES:   Obesity unspecified  INTERVENTION:  Continue Nepro Shake po BID, each supplement provides 425 kcal and 19 grams protein.  Encourage adequate PO intake.   NUTRITION DIAGNOSIS:   Inadequate oral intake related to poor appetite as evidenced by per patient/family report, meal completion < 50%; improved  GOAL:   Patient will meet greater than or equal to 90% of their needs; met  MONITOR:   Diet advancement, Supplement acceptance, PO intake, Weight trends  REASON FOR ASSESSMENT:   Rounds (RD to order TF)    ASSESSMENT:   55 y/o man with bipolar d/o who attempted suicide with polysubstance overdose and slit wrist  2/8 - extubated 2/9 - diet advanced to soft diet 2/10 - pt currently undergoing intermittent HD  Pt undergoing HD today. Meal completion has been 75-100% with most intake 100%. Pt currently has Nepro Shake ordered with varied consumption. RD to continue with current orders to aid in caloric and protein needs. Pt encouraged to eat his food at meals.   Labs: Phosphorous elevated at 10.6.  Diet Order:  DIET SOFT Room service appropriate?: Yes; Fluid consistency:: Thin  Skin:  Wound (see comment) (Laceration on wrist and R big toe, skin tear on face)  Last BM:  2/19  Height:   Ht Readings from Last 1 Encounters:  03/03/15 6' 2" (1.88 m)    Weight:   Wt Readings from Last 1 Encounters:  03/06/15 250 lb 14.1 oz (113.8 kg)    Ideal Body Weight:  72.7 kg  BMI:  Body mass index is 32.2 kg/(m^2).  Estimated Nutritional Needs:   Kcal:  2200-2400  Protein:  110-120 gm  Fluid:  Per MD  EDUCATION NEEDS:   No education needs identified at this time   , MS, RD, LDN Pager # [phone] After hours/ weekend pager # [phone]  

## 2015-03-06 NOTE — Progress Notes (Signed)
Patient complained of pain during shift change. He stated that he does not like the fentanyl patch ordered for him (patch had been taken off), neither did he like Vicodin or tramadol since they don't work. NP on call notified.

## 2015-03-06 NOTE — Progress Notes (Signed)
Patient requested for Fentanyl patch and states he thinks dialysis helped with his fluid overload and chest pain, so it is not the Fentanyl patch that was causing his pain. Fentanyl patch applied @ 23:13, tramadol given @21 :34 on as well per patient's request.

## 2015-03-07 LAB — FOLATE: Folate: 13.2 ng/mL (ref >5.9)

## 2015-03-07 LAB — TYPE AND SCREEN
ABO/RH(D): A POS
Antibody Screen: NEGATIVE
Unit division: 0
Unit division: 0

## 2015-03-07 LAB — GLUCOSE, CAPILLARY
Glucose-Capillary: 108 mg/dL — ABNORMAL HIGH (ref 65–99)
Glucose-Capillary: 203 mg/dL — ABNORMAL HIGH (ref 65–99)
Glucose-Capillary: 173 mg/dL — ABNORMAL HIGH (ref 65–99)
Glucose-Capillary: 179 mg/dL — ABNORMAL HIGH (ref 65–99)

## 2015-03-07 LAB — IRON AND TIBC
Iron: 20 ug/dL — ABNORMAL LOW (ref 45–182)
Saturation Ratios: 7 % — ABNORMAL LOW (ref 17.9–39.5)
TIBC: 305 ug/dL (ref 250–450)
UIBC: 285 ug/dL

## 2015-03-07 LAB — RENAL FUNCTION PANEL
Albumin: 2.9 g/dL — ABNORMAL LOW (ref 3.5–5.0)
Anion gap: 12 (ref 5–15)
BUN: 44 mg/dL — ABNORMAL HIGH (ref 6–20)
CO2: 28 mmol/L (ref 22–32)
Calcium: 8.3 mg/dL — ABNORMAL LOW (ref 8.9–10.3)
Chloride: 100 mmol/L — ABNORMAL LOW (ref 101–111)
Creatinine, Ser: 8.82 mg/dL — ABNORMAL HIGH (ref 0.61–1.24)
GFR calc Af Amer: 7 mL/min — ABNORMAL LOW (ref >60)
GFR calc non Af Amer: 6 mL/min — ABNORMAL LOW (ref >60)
Glucose, Bld: 203 mg/dL — ABNORMAL HIGH (ref 65–99)
Phosphorus: 5.8 mg/dL — ABNORMAL HIGH (ref 2.5–4.6)
Potassium: 4.4 mmol/L (ref 3.5–5.1)
Sodium: 140 mmol/L (ref 135–145)

## 2015-03-07 LAB — RETICULOCYTES
RBC.: 2.75 MIL/uL — ABNORMAL LOW (ref 4.22–5.81)
Retic Count, Absolute: 27.5 10*3/uL (ref 19.0–186.0)
Retic Ct Pct: 1 % (ref 0.4–3.1)

## 2015-03-07 LAB — FERRITIN: Ferritin: 192 ng/mL (ref 24–336)

## 2015-03-07 LAB — VITAMIN B12: Vitamin B-12: 634 pg/mL (ref 180–914)

## 2015-03-07 LAB — PTH, INTACT AND CALCIUM
Calcium, Total (PTH): 8.1 mg/dL — ABNORMAL LOW (ref 8.7–10.2)
PTH: 395 pg/mL — ABNORMAL HIGH (ref 15–65)

## 2015-03-07 LAB — HEMOGLOBIN AND HEMATOCRIT, BLOOD
HCT: 23.4 % — ABNORMAL LOW (ref 39.0–52.0)
Hemoglobin: 7.9 g/dL — ABNORMAL LOW (ref 13.0–17.0)

## 2015-03-07 MED ORDER — INSULIN GLARGINE 100 UNIT/ML ~~LOC~~ SOLN
35.0000 [IU] | Freq: Every day | SUBCUTANEOUS | Status: DC
  Administered 2015-03-07 – 2015-03-12 (×6): 35 [IU] via SUBCUTANEOUS
  Filled 2015-03-07 (×7): qty 0.35

## 2015-03-07 MED ORDER — INSULIN ASPART 100 UNIT/ML ~~LOC~~ SOLN
3.0000 [IU] | Freq: Three times a day (TID) | SUBCUTANEOUS | Status: DC
  Administered 2015-03-07 – 2015-03-09 (×6): 3 [IU] via SUBCUTANEOUS

## 2015-03-07 MED ORDER — MUPIROCIN CALCIUM 2 % EX CREA
TOPICAL_CREAM | Freq: Every day | CUTANEOUS | Status: DC
  Administered 2015-03-07 – 2015-03-12 (×6): via TOPICAL
  Filled 2015-03-07: qty 15

## 2015-03-07 MED ORDER — INSULIN GLARGINE 100 UNIT/ML ~~LOC~~ SOLN: 40.0000 [IU] | Freq: Every day | SUBCUTANEOUS | Status: DC

## 2015-03-07 NOTE — Progress Notes (Signed)
Subjective:  Hemodynamically stable- had HD yesterday removed 2700 also  1700 of UOP yest- creatinine down to 8 after HD  Objective Vital signs in last 24 hours: Filed Vitals:   03/06/15 1811 03/06/15 2102 03/07/15 0525 03/07/15 1000  BP: 160/79 150/77 125/71 144/82  Pulse: 88 91 85 77  Temp: 98.6 F (37 C) 99.9 F (37.7 C) 98.7 F (37.1 C) 98.2 F (36.8 C)  TempSrc: Oral   Oral  Resp: 20 22 21 20   Height:      Weight:      SpO2: 98% 98% 97% 98%   Weight change: 0.265 kg (9.3 oz)  Intake/Output Summary (Last 24 hours) at 03/07/15 1129 Last data filed at 03/07/15 0900  Gross per 24 hour  Intake   1701 ml  Output   4480 ml  Net  -2779 ml    Assessment/ Plan: Pt is a 55 y.o. yo male with bipolar d/o, chronic pain, DM, HTN  who was admitted on 02/18/2015 with s/p suicide attempt with slit wrist and O/D of DM and BP meds as well as gabapentin - creatinine normal in October 2016 Assessment/Plan: 1. Renal- HD Done  2/10, 2/13,  2/14 , 2/18 and 2/21- much better UOP - will continue to watch daily for signs of recovery.  has been HD dependent in some form since 2/5- tunneled HD cath placed on 2/10.  Will send papers to get OP spot for acute HD- not able to do acute HD as OP due to Medicaid status therefore will need to wait til he recovers or stays HD dependent for 3-4 more weeks  2. HTN/volume- seems OK- on amlodipine, lasix 3. Anemia- hgb 6.3 on 2/21 after not being checked for a week- s/p transfusion - will need to check iron stores and started ESA 4. Bones- check intact PTH and on phoslo for phos binding     Makenleigh Crownover A    Labs: Basic Metabolic Panel:  Recent Labs Lab 03/05/15 0655 03/06/15 0812 03/06/15 1138 03/07/15 0324  NA 138  139 140  --  140  K 4.6  4.5 4.4  --  4.4  CL 101  102 102  --  100*  CO2 21*  21* 21*  --  28  GLUCOSE 244*  243* 152*  --  203*  BUN 72*  71* 80*  --  44*  CREATININE 12.39*  12.30* 13.64*  --  8.82*  CALCIUM 7.9*   7.9* 8.5* 8.1* 8.3*  PHOS 9.4* 10.6*  --  5.8*   Liver Function Tests:  Recent Labs Lab 03/05/15 0655 03/06/15 0812 03/07/15 0324  ALBUMIN 2.7* 3.0* 2.9*   No results for input(s): LIPASE, AMYLASE in the last 168 hours. No results for input(s): AMMONIA in the last 168 hours. CBC:  Recent Labs Lab 03/06/15 1138 03/07/15 0836  WBC 5.3  --   HGB 6.3* 7.9*  HCT 18.6* 23.4*  MCV 81.2  --   PLT 207  --    Cardiac Enzymes: No results for input(s): CKTOTAL, CKMB, CKMBINDEX, TROPONINI in the last 168 hours. CBG:  Recent Labs Lab 03/05/15 2236 03/06/15 0754 03/06/15 1646 03/06/15 2101 03/07/15 0813  GLUCAP 310* 139* 224* 317* 108*    Iron Studies:   Recent Labs  03/06/15 1138  IRON 15*  TIBC 298   Studies/Results: No results found. Medications: Infusions:    Scheduled Medications: . sodium chloride   Intravenous Once  . amLODipine  5 mg Oral Daily  . anticoagulant sodium  citrate  5 mL Intravenous STAT  . calcium acetate  1,334 mg Oral TID WC  . chlorhexidine gluconate  15 mL Mouth Rinse BID  . darbepoetin (ARANESP) injection - DIALYSIS  150 mcg Intravenous Q Tue-HD  . diazepam  5 mg Oral QHS  . feeding supplement (NEPRO CARB STEADY)  237 mL Oral BID BM  . fentaNYL  50 mcg Transdermal Q48H  . FLUoxetine  20 mg Oral Daily  . furosemide  160 mg Oral BID  . insulin aspart  0-5 Units Subcutaneous QHS  . insulin aspart  0-9 Units Subcutaneous TID WC  . insulin aspart  3 Units Subcutaneous TID WC  . insulin glargine  35 Units Subcutaneous QHS  . mupirocin cream   Topical Daily  . OLANZapine  5 mg Oral QHS  . pantoprazole  40 mg Oral Daily  . pregabalin  75 mg Oral QHS    have reviewed scheduled and prn medications.  Physical Exam: General: NAD Heart: RRR Lungs: clear Abdomen: soft, non tender Extremities: pitting edema Dialysis Access: right IJ PC     03/07/2015,11:29 AM  LOS: 17 days

## 2015-03-07 NOTE — Consult Note (Addendum)
WOC wound consult note Reason for Consult: Consult requested for right toe.  Pt states wound has been present several months prior to the hospital stay, and was a dry callous where he applied neosporin ointment.  He states he developed edema in the hospital and wound has evolved at this time. Wound type: Dry crusted previous callous is loose and removes easily when area is cleansed, revealing full thickness wound .3X.3X.2cm, red and moist.  Scant amt tan drainage, no odor. Periwound: White macerated skin surrounding wound bed to .2cm, loose peeling skin to right plantar great toe removes easily, revealing pink dry skin.. Dressing procedure/placement/frequency:  Bactroban to promote moist healing.  Discussed plan of care with patient and he verbalized understanding. Please re-consult if further assistance is needed.  Thank-you,  Julien Girt MSN, [place], [place], [place], [place]
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
## 2015-03-07 NOTE — Progress Notes (Signed)
Inpatient Diabetes Program Recommendations  AACE/ADA: New Consensus Statement on Inpatient Glycemic Control (2015)  Target Ranges:  Prepandial:   less than 140 mg/dL      Peak postprandial:   less than 180 mg/dL (1-2 hours)      Critically ill patients:  140 - 180 mg/dL   Results for ORDEAN, MURLEY (MRN [id number]) as of 03/07/2015 09:37  Ref. Range 03/06/2015 07:54 03/06/2015 16:46 03/06/2015 21:01 03/07/2015 08:13  Glucose-Capillary Latest Ref Range: 65-99 mg/dL 139 (H) 224 (H) 317 (H) 108 (H)   Review of Glycemic Control   Inpatient Diabetes Program Recommendations:  Insulin - Meal Coverage: Patient's glucose increases from 139 to 317 after meals yesterday. Please consider Novolog 3-5 units of Meal Coverage TID.  Thanks,  Tama Headings RN, MSN, [employer] Inpatient Diabetes Coordinator Team Pager [phone] (8a-5p)

## 2015-03-08 LAB — RENAL FUNCTION PANEL
Albumin: 2.7 g/dL — ABNORMAL LOW (ref 3.5–5.0)
Anion gap: 13 (ref 5–15)
BUN: 61 mg/dL — ABNORMAL HIGH (ref 6–20)
CO2: 26 mmol/L (ref 22–32)
Calcium: 8.5 mg/dL — ABNORMAL LOW (ref 8.9–10.3)
Chloride: 102 mmol/L (ref 101–111)
Creatinine, Ser: 10.32 mg/dL — ABNORMAL HIGH (ref 0.61–1.24)
GFR calc Af Amer: 6 mL/min — ABNORMAL LOW (ref >60)
GFR calc non Af Amer: 5 mL/min — ABNORMAL LOW (ref >60)
Glucose, Bld: 91 mg/dL (ref 65–99)
Phosphorus: 7.5 mg/dL — ABNORMAL HIGH (ref 2.5–4.6)
Potassium: 3.6 mmol/L (ref 3.5–5.1)
Sodium: 141 mmol/L (ref 135–145)

## 2015-03-08 LAB — CBC
HCT: 22.5 % — ABNORMAL LOW (ref 39.0–52.0)
Hemoglobin: 7.6 g/dL — ABNORMAL LOW (ref 13.0–17.0)
MCH: 28.1 pg (ref 26.0–34.0)
MCHC: 33.8 g/dL (ref 30.0–36.0)
MCV: 83.3 fL (ref 78.0–100.0)
Platelets: 198 10*3/uL (ref 150–400)
RBC: 2.7 MIL/uL — ABNORMAL LOW (ref 4.22–5.81)
RDW: 14.2 % (ref 11.5–15.5)
WBC: 4.5 10*3/uL (ref 4.0–10.5)

## 2015-03-08 LAB — GLUCOSE, CAPILLARY
Glucose-Capillary: 81 mg/dL (ref 65–99)
Glucose-Capillary: 245 mg/dL — ABNORMAL HIGH (ref 65–99)
Glucose-Capillary: 223 mg/dL — ABNORMAL HIGH (ref 65–99)

## 2015-03-08 MED ORDER — FERROUS SULFATE 325 (65 FE) MG PO TABS
325.0000 mg | ORAL_TABLET | Freq: Two times a day (BID) | ORAL | Status: DC
  Administered 2015-03-08 – 2015-03-13 (×11): 325 mg via ORAL
  Filled 2015-03-08 (×11): qty 1

## 2015-03-08 MED ORDER — SODIUM CHLORIDE 0.9 % IV SOLN
125.0000 mg | Freq: Every day | INTRAVENOUS | Status: AC
  Administered 2015-03-08 – 2015-03-10 (×3): 125 mg via INTRAVENOUS
  Filled 2015-03-08 (×5): qty 10

## 2015-03-08 MED ORDER — CALCITRIOL 0.25 MCG PO CAPS
0.2500 ug | ORAL_CAPSULE | Freq: Every day | ORAL | Status: DC
  Administered 2015-03-08 – 2015-03-11 (×4): 0.25 ug via ORAL
  Filled 2015-03-08 (×4): qty 1
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
## 2015-03-08 NOTE — Progress Notes (Signed)
Subjective:  Hemodynamically stable- 2400 of UOP yest- crt and BUN rose quite a bit off HD 8->10 and 60->80  Objective Vital signs in last 24 hours: Filed Vitals:   03/07/15 2116 03/07/15 2229 03/08/15 0556 03/08/15 1000  BP: 153/81  135/69 153/79  Pulse: 86  82 86  Temp: 98.6 F (37 C)  99.4 F (37.4 C) 99 F (37.2 C)  TempSrc:    Oral  Resp: 21  22 20   Height:      Weight:  111.131 kg (245 lb)    SpO2: 100%  92% 98%   Weight change: -2.669 kg (-5 lb 14.1 oz)  Intake/Output Summary (Last 24 hours) at 03/08/15 1119 Last data filed at 03/08/15 0900  Gross per 24 hour  Intake    960 ml  Output   2500 ml  Net  -1540 ml    Assessment/ Plan: Pt is a 55 y.o. yo male with bipolar d/o, chronic pain, DM, HTN  who was admitted on 02/18/2015 with s/p suicide attempt with slit wrist and O/D of DM and BP meds as well as gabapentin - creatinine normal in October 2016 Assessment/Plan: 1. Renal- HD Done  2/10, 2/13,  2/14 , 2/18 and 2/21- much better UOP - will continue to watch daily for signs of recovery.  has been HD dependent in some form since 2/5- tunneled HD cath placed on 2/10.  - not able to do acute HD as OP due to Medicaid status therefore will need to wait til he recovers or stays HD dependent for 3-4 more weeks - will likely need HD tomorrow if pattern with labs continues 2. HTN/volume- seems OK- on amlodipine, lasix 3. Anemia- hgb 6.3 on 2/21 after not being checked for a week- s/p transfusion on 2/21 - iron stores low- will replete  and started ESA 4. Bones-  PTH 395 - start vit d and on phoslo for phos binding     [name] [name] A    Labs: Basic Metabolic Panel:  Recent Labs Lab 03/06/15 0812 03/06/15 1138 03/07/15 0324 03/08/15 0655  NA 140  --  140 141  K 4.4  --  4.4 3.6  CL 102  --  100* 102  CO2 21*  --  28 26  GLUCOSE 152*  --  203* 91  BUN 80*  --  44* 61*  CREATININE 13.64*  --  8.82* 10.32*  CALCIUM 8.5* 8.1* 8.3* 8.5*  PHOS 10.6*  --  5.8* 7.5*    Liver Function Tests:  Recent Labs Lab 03/06/15 0812 03/07/15 0324 03/08/15 0655  ALBUMIN 3.0* 2.9* 2.7*   No results for input(s): LIPASE, AMYLASE in the last 168 hours. No results for input(s): AMMONIA in the last 168 hours. CBC:  Recent Labs Lab 03/06/15 1138 03/07/15 0836 03/08/15 0655  WBC 5.3  --  4.5  HGB 6.3* 7.9* 7.6*  HCT 18.6* 23.4* 22.5*  MCV 81.2  --  83.3  PLT 207  --  198   Cardiac Enzymes: No results for input(s): CKTOTAL, CKMB, CKMBINDEX, TROPONINI in the last 168 hours. CBG:  Recent Labs Lab 03/07/15 0813 03/07/15 1201 03/07/15 1701 03/07/15 2115 03/08/15 0745  GLUCAP 108* 203* 173* 179* 81    Iron Studies:   Recent Labs  03/07/15 1143  IRON 20*  TIBC 305  FERRITIN 192   Studies/Results: No results found. Medications: Infusions:    Scheduled Medications: . sodium chloride   Intravenous Once  . amLODipine  5 mg Oral Daily  .  calcium acetate  1,334 mg Oral TID WC  . chlorhexidine gluconate  15 mL Mouth Rinse BID  . darbepoetin (ARANESP) injection - DIALYSIS  150 mcg Intravenous Q Tue-HD  . diazepam  5 mg Oral QHS  . feeding supplement (NEPRO CARB STEADY)  237 mL Oral BID BM  . fentaNYL  50 mcg Transdermal Q48H  . ferrous sulfate  325 mg Oral BID WC  . FLUoxetine  20 mg Oral Daily  . furosemide  160 mg Oral BID  . insulin aspart  0-5 Units Subcutaneous QHS  . insulin aspart  0-9 Units Subcutaneous TID WC  . insulin aspart  3 Units Subcutaneous TID WC  . insulin glargine  35 Units Subcutaneous QHS  . mupirocin cream   Topical Daily  . OLANZapine  5 mg Oral QHS  . pantoprazole  40 mg Oral Daily  . pregabalin  75 mg Oral QHS    have reviewed scheduled and prn medications.  Physical Exam: General: NAD Heart: RRR Lungs: clear Abdomen: soft, non tender Extremities: pitting edema Dialysis Access: right IJ PC     03/08/2015,11:19 AM  LOS: 18 days

## 2015-03-09 LAB — RENAL FUNCTION PANEL
Albumin: 2.8 g/dL — ABNORMAL LOW (ref 3.5–5.0)
Anion gap: 16 — ABNORMAL HIGH (ref 5–15)
BUN: 73 mg/dL — ABNORMAL HIGH (ref 6–20)
CO2: 26 mmol/L (ref 22–32)
Calcium: 9 mg/dL (ref 8.9–10.3)
Chloride: 98 mmol/L — ABNORMAL LOW (ref 101–111)
Creatinine, Ser: 10.61 mg/dL — ABNORMAL HIGH (ref 0.61–1.24)
GFR calc Af Amer: 6 mL/min — ABNORMAL LOW (ref >60)
GFR calc non Af Amer: 5 mL/min — ABNORMAL LOW (ref >60)
Glucose, Bld: 143 mg/dL — ABNORMAL HIGH (ref 65–99)
Phosphorus: 8.1 mg/dL — ABNORMAL HIGH (ref 2.5–4.6)
Potassium: 4.3 mmol/L (ref 3.5–5.1)
Sodium: 140 mmol/L (ref 135–145)

## 2015-03-09 LAB — GLUCOSE, CAPILLARY
Glucose-Capillary: 236 mg/dL — ABNORMAL HIGH (ref 65–99)
Glucose-Capillary: 113 mg/dL — ABNORMAL HIGH (ref 65–99)
Glucose-Capillary: 161 mg/dL — ABNORMAL HIGH (ref 65–99)
Glucose-Capillary: 271 mg/dL — ABNORMAL HIGH (ref 65–99)
Glucose-Capillary: 200 mg/dL — ABNORMAL HIGH (ref 65–99)

## 2015-03-09 LAB — HEMOGLOBIN AND HEMATOCRIT, BLOOD
HCT: 22.7 % — ABNORMAL LOW (ref 39.0–52.0)
Hemoglobin: 7.6 g/dL — ABNORMAL LOW (ref 13.0–17.0)

## 2015-03-09 MED ORDER — INSULIN ASPART 100 UNIT/ML ~~LOC~~ SOLN
5.0000 [IU] | Freq: Three times a day (TID) | SUBCUTANEOUS | Status: DC
  Administered 2015-03-09 – 2015-03-13 (×12): 5 [IU] via SUBCUTANEOUS

## 2015-03-09 MED ORDER — SODIUM CHLORIDE 0.9% FLUSH
10.0000 mL | INTRAVENOUS | Status: DC | PRN
  Administered 2015-03-12: 10 mL
  Filled 2015-03-09: qty 40

## 2015-03-09 NOTE — Progress Notes (Signed)
Inpatient Diabetes Program Recommendations  AACE/ADA: New Consensus Statement on Inpatient Glycemic Control (2015)  Target Ranges:  Prepandial:   less than 140 mg/dL      Peak postprandial:   less than 180 mg/dL (1-2 hours)      Critically ill patients:  140 - 180 mg/dL  Results for COZY, MCQUADE (MRN [id number]) as of 03/09/2015 09:17  Ref. Range 03/08/2015 07:45 03/08/2015 11:28 03/08/2015 16:32 03/08/2015 21:25 03/09/2015 07:35  Glucose-Capillary Latest Ref Range: 65-99 mg/dL 81 245 (H) 223 (H) 236 (H) 113 (H)   Review of Glycemic Control  Current orders for Inpatient glycemic control: Lantus 35 units QHS, Novolog 3 units TID with meals for meal coverage, Novolog 0-9 units TID with meals, Novolog 0-5 units QHS  Inpatient Diabetes Program Recommendations: Insulin - Meal Coverage: Post prandial glucose continues to be elevated despite Novolog 3 units for meal coverage. Please consider increasing meal coverage to Novolog 6 units TID with meals.  Thanks, Barnie Alderman, RN, MSN, CDE Diabetes Coordinator Inpatient Diabetes Program [phone] (Team Pager from [place] to [place]) [phone] ([facility] office) [phone] [facility] office) [phone] [facility] office)

## 2015-03-09 NOTE — Progress Notes (Signed)
Subjective:  Hemodynamically stable- 3700 of UOP yest- crt and BUN went up but rate of rise less ?   Objective Vital signs in last 24 hours: Filed Vitals:   03/08/15 1000 03/08/15 2127 03/09/15 0651 03/09/15 1000  BP: 153/79 153/80 126/77 153/63  Pulse: 86 90 84 80  Temp: 99 F (37.2 C) 100.2 F (37.9 C) 98.4 F (36.9 C) 98.4 F (36.9 C)  TempSrc: Oral Oral Oral Oral  Resp: 20 18 16 18   Height:      Weight:   111.8 kg (246 lb 7.6 oz)   SpO2: 98% 98% 98% 98%   Weight change: 0.669 kg (1 lb 7.6 oz)  Intake/Output Summary (Last 24 hours) at 03/09/15 1259 Last data filed at 03/09/15 1203  Gross per 24 hour  Intake    940 ml  Output   4225 ml  Net  -3285 ml    Assessment/ Plan: Pt is a 55 y.o. yo male with bipolar d/o, chronic pain, DM, HTN  who was admitted on 02/18/2015 with s/p suicide attempt with slit wrist and O/D of DM and BP meds as well as gabapentin - creatinine normal in October 2016 Assessment/Plan: 1. Renal- HD Done  2/10, 2/13,  2/14 , 2/18 and 2/21- much better UOP - will continue to watch daily for signs of recovery.  has been HD dependent in some form since 2/5- tunneled HD cath placed on 2/10.  - not able to do acute HD as OP due to Medicaid status therefore will need to wait til he recovers or stays HD dependent for 3-4 more weeks - since rate of rise is less and is making great urine will hold off on HD today  and watch 2. HTN/volume- seems OK- on amlodipine, lasix 3. Anemia- hgb 6.3 on 2/21 after not being checked for a week- s/p transfusion on 2/21 - iron stores low- repleting  and started ESA 4. Bones-  PTH 395 - start vit d and on phoslo for phos binding     Wynema Garoutte A    Labs: Basic Metabolic Panel:  Recent Labs Lab 03/07/15 0324 03/08/15 0655 03/09/15 0535  NA 140 141 140  K 4.4 3.6 4.3  CL 100* 102 98*  CO2 28 26 26   GLUCOSE 203* 91 143*  BUN 44* 61* 73*  CREATININE 8.82* 10.32* 10.61*  CALCIUM 8.3* 8.5* 9.0  PHOS 5.8* 7.5* 8.1*    Liver Function Tests:  Recent Labs Lab 03/07/15 0324 03/08/15 0655 03/09/15 0535  ALBUMIN 2.9* 2.7* 2.8*   No results for input(s): LIPASE, AMYLASE in the last 168 hours. No results for input(s): AMMONIA in the last 168 hours. CBC:  Recent Labs Lab 03/06/15 1138 03/07/15 0836 03/08/15 0655 03/09/15 0540  WBC 5.3  --  4.5  --   HGB 6.3* 7.9* 7.6* 7.6*  HCT 18.6* 23.4* 22.5* 22.7*  MCV 81.2  --  83.3  --   PLT 207  --  198  --    Cardiac Enzymes: No results for input(s): CKTOTAL, CKMB, CKMBINDEX, TROPONINI in the last 168 hours. CBG:  Recent Labs Lab 03/08/15 1128 03/08/15 1632 03/08/15 2125 03/09/15 0735 03/09/15 1144  GLUCAP 245* 223* 236* 113* 161*    Iron Studies:   Recent Labs  03/07/15 1143  IRON 20*  TIBC 305  FERRITIN 192   Studies/Results: No results found. Medications: Infusions:    Scheduled Medications: . sodium chloride   Intravenous Once  . amLODipine  5 mg Oral Daily  .  calcitRIOL  0.25 mcg Oral Daily  . calcium acetate  1,334 mg Oral TID WC  . chlorhexidine gluconate  15 mL Mouth Rinse BID  . darbepoetin (ARANESP) injection - DIALYSIS  150 mcg Intravenous Q Tue-HD  . diazepam  5 mg Oral QHS  . feeding supplement (NEPRO CARB STEADY)  237 mL Oral BID BM  . fentaNYL  50 mcg Transdermal Q48H  . ferric gluconate (FERRLECIT/NULECIT) IV  125 mg Intravenous Daily  . ferrous sulfate  325 mg Oral BID WC  . FLUoxetine  20 mg Oral Daily  . furosemide  160 mg Oral BID  . insulin aspart  0-5 Units Subcutaneous QHS  . insulin aspart  0-9 Units Subcutaneous TID WC  . insulin aspart  5 Units Subcutaneous TID WC  . insulin glargine  35 Units Subcutaneous QHS  . mupirocin cream   Topical Daily  . OLANZapine  5 mg Oral QHS  . pantoprazole  40 mg Oral Daily  . pregabalin  75 mg Oral QHS    have reviewed scheduled and prn medications.  Physical Exam: General: NAD Heart: RRR Lungs: clear Abdomen: soft, non tender Extremities: pitting  edema Dialysis Access: right IJ PC     03/09/2015,12:59 PM  LOS: 19 days
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
## 2015-03-10 LAB — RENAL FUNCTION PANEL
Albumin: 2.9 g/dL — ABNORMAL LOW (ref 3.5–5.0)
Anion gap: 18 — ABNORMAL HIGH (ref 5–15)
BUN: 81 mg/dL — ABNORMAL HIGH (ref 6–20)
CO2: 25 mmol/L (ref 22–32)
Calcium: 9 mg/dL (ref 8.9–10.3)
Chloride: 97 mmol/L — ABNORMAL LOW (ref 101–111)
Creatinine, Ser: 10.5 mg/dL — ABNORMAL HIGH (ref 0.61–1.24)
GFR calc Af Amer: 6 mL/min — ABNORMAL LOW (ref >60)
GFR calc non Af Amer: 5 mL/min — ABNORMAL LOW (ref >60)
Glucose, Bld: 103 mg/dL — ABNORMAL HIGH (ref 65–99)
Phosphorus: 8.2 mg/dL — ABNORMAL HIGH (ref 2.5–4.6)
Potassium: 4.1 mmol/L (ref 3.5–5.1)
Sodium: 140 mmol/L (ref 135–145)

## 2015-03-10 LAB — GLUCOSE, CAPILLARY
Glucose-Capillary: 122 mg/dL — ABNORMAL HIGH (ref 65–99)
Glucose-Capillary: 196 mg/dL — ABNORMAL HIGH (ref 65–99)
Glucose-Capillary: 191 mg/dL — ABNORMAL HIGH (ref 65–99)
Glucose-Capillary: 278 mg/dL — ABNORMAL HIGH (ref 65–99)

## 2015-03-10 NOTE — Progress Notes (Signed)
Subjective:  Hemodynamically stable- 5000 of UOP yest- crt down  and BUN went up only slightly   Objective Vital signs in last 24 hours: Filed Vitals:   03/09/15 1647 03/09/15 1940 03/10/15 0429 03/10/15 0807  BP: 118/81 128/78 131/73 133/75  Pulse: 87 81 83 81  Temp: 98.7 F (37.1 C) 98.6 F (37 C) 98 F (36.7 C) 99.6 F (37.6 C)  TempSrc: Oral Oral Oral Oral  Resp: 18 18 18 18   Height:      Weight:      SpO2: 99% 98% 98% 97%   Weight change:   Intake/Output Summary (Last 24 hours) at 03/10/15 1014 Last data filed at 03/10/15 [id number]  Gross per 24 hour  Intake    360 ml  Output   4525 ml  Net  -4165 ml    Assessment/ Plan: Pt is a 55 y.o. yo male with bipolar d/o, chronic pain, DM, HTN  who was admitted on 02/18/2015 with s/p suicide attempt with slit wrist and O/D of DM and BP meds as well as gabapentin - creatinine normal in October 2016 Assessment/Plan: 1. Renal- HD Done  2/10, 2/13,  2/14 , 2/18 and 2/21- much better UOP - will continue to watch daily for signs of recovery.  has been HD dependent in some form since 2/5- tunneled HD cath placed on 2/10.  - not able to do acute HD as OP due to Medicaid status therefore will need to wait til he recovers or stays HD dependent for 3-4 more weeks - today creatinine actually down - BUN only up slightly- will stop lasix given great UOP - no need for HD today- this is looking more promising  2. HTN/volume- seems OK- on amlodipine- will stop lasix 3. Anemia- hgb 6.3 on 2/21 after not being checked for a week- s/p transfusion on 2/21 - iron stores low- repleted  and started ESA- hgb 7.6 recently  4. Bones-  PTH 395 - start vit d and on phoslo for phos binding     Eldor Conaway A    Labs: Basic Metabolic Panel:  Recent Labs Lab 03/08/15 0655 03/09/15 0535 03/10/15 0538  NA 141 140 140  K 3.6 4.3 4.1  CL 102 98* 97*  CO2 26 26 25   GLUCOSE 91 143* 103*  BUN 61* 73* 81*  CREATININE 10.32* 10.61* 10.50*  CALCIUM 8.5*  9.0 9.0  PHOS 7.5* 8.1* 8.2*   Liver Function Tests:  Recent Labs Lab 03/08/15 0655 03/09/15 0535 03/10/15 0538  ALBUMIN 2.7* 2.8* 2.9*   No results for input(s): LIPASE, AMYLASE in the last 168 hours. No results for input(s): AMMONIA in the last 168 hours. CBC:  Recent Labs Lab 03/06/15 1138 03/07/15 0836 03/08/15 0655 03/09/15 0540  WBC 5.3  --  4.5  --   HGB 6.3* 7.9* 7.6* 7.6*  HCT 18.6* 23.4* 22.5* 22.7*  MCV 81.2  --  83.3  --   PLT 207  --  198  --    Cardiac Enzymes: No results for input(s): CKTOTAL, CKMB, CKMBINDEX, TROPONINI in the last 168 hours. CBG:  Recent Labs Lab 03/09/15 0735 03/09/15 1144 03/09/15 1646 03/09/15 2125 03/10/15 0806  GLUCAP 113* 161* 271* 200* 122*    Iron Studies:   Recent Labs  03/07/15 1143  IRON 20*  TIBC 305  FERRITIN 192   Studies/Results: No results found. Medications: Infusions:    Scheduled Medications: . sodium chloride   Intravenous Once  . amLODipine  5 mg Oral Daily  .  calcitRIOL  0.25 mcg Oral Daily  . calcium acetate  1,334 mg Oral TID WC  . chlorhexidine gluconate  15 mL Mouth Rinse BID  . darbepoetin (ARANESP) injection - DIALYSIS  150 mcg Intravenous Q Tue-HD  . diazepam  5 mg Oral QHS  . feeding supplement (NEPRO CARB STEADY)  237 mL Oral BID BM  . fentaNYL  50 mcg Transdermal Q48H  . ferric gluconate (FERRLECIT/NULECIT) IV  125 mg Intravenous Daily  . ferrous sulfate  325 mg Oral BID WC  . FLUoxetine  20 mg Oral Daily  . furosemide  160 mg Oral BID  . insulin aspart  0-5 Units Subcutaneous QHS  . insulin aspart  0-9 Units Subcutaneous TID WC  . insulin aspart  5 Units Subcutaneous TID WC  . insulin glargine  35 Units Subcutaneous QHS  . mupirocin cream   Topical Daily  . OLANZapine  5 mg Oral QHS  . pantoprazole  40 mg Oral Daily  . pregabalin  75 mg Oral QHS    have reviewed scheduled and prn medications.  Physical Exam: General: NAD Heart: RRR Lungs: clear Abdomen: soft, non  tender Extremities: pitting edema- but improved  Dialysis Access: right IJ PC     03/10/2015,10:14 AM  LOS: 20 days
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
## 2015-03-11 LAB — GLUCOSE, CAPILLARY
Glucose-Capillary: 159 mg/dL — ABNORMAL HIGH (ref 65–99)
Glucose-Capillary: 147 mg/dL — ABNORMAL HIGH (ref 65–99)
Glucose-Capillary: 200 mg/dL — ABNORMAL HIGH (ref 65–99)
Glucose-Capillary: 269 mg/dL — ABNORMAL HIGH (ref 65–99)

## 2015-03-11 LAB — RENAL FUNCTION PANEL
Albumin: 3 g/dL — ABNORMAL LOW (ref 3.5–5.0)
Anion gap: 14 (ref 5–15)
BUN: 88 mg/dL — ABNORMAL HIGH (ref 6–20)
CO2: 29 mmol/L (ref 22–32)
Calcium: 9.2 mg/dL (ref 8.9–10.3)
Chloride: 98 mmol/L — ABNORMAL LOW (ref 101–111)
Creatinine, Ser: 10.33 mg/dL — ABNORMAL HIGH (ref 0.61–1.24)
GFR calc Af Amer: 6 mL/min — ABNORMAL LOW (ref >60)
GFR calc non Af Amer: 5 mL/min — ABNORMAL LOW (ref >60)
Glucose, Bld: 94 mg/dL (ref 65–99)
Phosphorus: 8 mg/dL — ABNORMAL HIGH (ref 2.5–4.6)
Potassium: 3.8 mmol/L (ref 3.5–5.1)
Sodium: 141 mmol/L (ref 135–145)

## 2015-03-11 MED ORDER — SODIUM CHLORIDE 0.9 % IV SOLN
INTRAVENOUS | Status: DC
  Administered 2015-03-11 (×2): via INTRAVENOUS

## 2015-03-11 NOTE — Progress Notes (Signed)
Subjective:  Hemodynamically stable- again 5000 of UOP yest- did seem to slow down after lasix stopped -  crt down  and BUN went up only slightly   Objective Vital signs in last 24 hours: Filed Vitals:   03/10/15 2023 03/10/15 2124 03/11/15 0443 03/11/15 1044  BP: 121/64  117/68 119/79  Pulse: 91  78 83  Temp: 99 F (37.2 C)  98.6 F (37 C) 98.1 F (36.7 C)  TempSrc:    Oral  Resp: 22  21 19   Height:      Weight:  107.049 kg (236 lb)    SpO2: 100%  94% 96%   Weight change:   Intake/Output Summary (Last 24 hours) at 03/11/15 1135 Last data filed at 03/11/15 1044  Gross per 24 hour  Intake    720 ml  Output   4750 ml  Net  -4030 ml    Assessment/ Plan: Pt is [name] 55 y.o. yo male with bipolar d/o, chronic pain, DM, HTN  who was admitted on 02/18/2015 with s/p suicide attempt with slit wrist and O/D of DM and BP meds as well as gabapentin - creatinine normal in October 2016 Assessment/Plan: 1. Renal- HD Done  2/10, 2/13,  2/14 , 2/18 and 2/21 was last tx - much better UOP - will continue to watch daily for signs of recovery.  had been HD dependent in some form since 2/5- tunneled HD cath placed on 2/10.  - not able to do acute HD as OP due to Medicaid status therefore will need to wait til he recovers or stays HD dependent for 3-4 more weeks - today creatinine actually down again-  BUN only up slightly- no need for HD today- this is looking more promising- actually need to give him [name] little volume back to hopefully help to get his BUN down   2. HTN/volume- diuresing so much now (10 liters in 48 hours) - have stopped lasix- will give [name] little volume back since BP is low- also stop amlodipine 3. Anemia- hgb 6.3 on 2/21 after not being checked for [name] week- s/p transfusion on 2/21 - iron stores low- repleted  and started ESA- hgb 7.6 recently  4. Bones-  PTH 395 - started vit d and on phoslo for phos binding     [name] [name]    Labs: Basic Metabolic Panel:  Recent Labs Lab  03/09/15 0535 03/10/15 0538 03/11/15 0507  NA 140 140 141  K 4.3 4.1 3.8  CL 98* 97* 98*  CO2 26 25 29   GLUCOSE 143* 103* 94  BUN 73* 81* 88*  CREATININE 10.61* 10.50* 10.33*  CALCIUM 9.0 9.0 9.2  PHOS 8.1* 8.2* 8.0*   Liver Function Tests:  Recent Labs Lab 03/09/15 0535 03/10/15 0538 03/11/15 0507  ALBUMIN 2.8* 2.9* 3.0*   No results for input(s): LIPASE, AMYLASE in the last 168 hours. No results for input(s): AMMONIA in the last 168 hours. CBC:  Recent Labs Lab 03/06/15 1138 03/07/15 0836 03/08/15 0655 03/09/15 0540  WBC 5.3  --  4.5  --   HGB 6.3* 7.9* 7.6* 7.6*  HCT 18.6* 23.4* 22.5* 22.7*  MCV 81.2  --  83.3  --   PLT 207  --  198  --    Cardiac Enzymes: No results for input(s): CKTOTAL, CKMB, CKMBINDEX, TROPONINI in the last 168 hours. CBG:  Recent Labs Lab 03/10/15 0806 03/10/15 1209 03/10/15 1714 03/10/15 2022 03/11/15 0752  GLUCAP 122* 196* 191* 278* 159*  Iron Studies:  No results for input(s): IRON, TIBC, TRANSFERRIN, FERRITIN in the last 72 hours. Studies/Results: No results found. Medications: Infusions:    Scheduled Medications: . sodium chloride   Intravenous Once  . amLODipine  5 mg Oral Daily  . calcitRIOL  0.25 mcg Oral Daily  . calcium acetate  1,334 mg Oral TID WC  . chlorhexidine gluconate  15 mL Mouth Rinse BID  . darbepoetin (ARANESP) injection - DIALYSIS  150 mcg Intravenous Q Tue-HD  . diazepam  5 mg Oral QHS  . feeding supplement (NEPRO CARB STEADY)  237 mL Oral BID BM  . fentaNYL  50 mcg Transdermal Q48H  . ferrous sulfate  325 mg Oral BID WC  . FLUoxetine  20 mg Oral Daily  . insulin aspart  0-5 Units Subcutaneous QHS  . insulin aspart  0-9 Units Subcutaneous TID WC  . insulin aspart  5 Units Subcutaneous TID WC  . insulin glargine  35 Units Subcutaneous QHS  . mupirocin cream   Topical Daily  . OLANZapine  5 mg Oral QHS  . pantoprazole  40 mg Oral Daily  . pregabalin  75 mg Oral QHS    have reviewed  scheduled and prn medications.  Physical Exam: General: NAD Heart: RRR Lungs: clear Abdomen: soft, non tender Extremities: pitting edema- but improved  Dialysis Access: right IJ PC     03/11/2015,11:35 AM  LOS: 21 days
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
## 2015-03-12 LAB — RENAL FUNCTION PANEL
Albumin: 2.9 g/dL — ABNORMAL LOW (ref 3.5–5.0)
Anion gap: 15 (ref 5–15)
BUN: 83 mg/dL — ABNORMAL HIGH (ref 6–20)
CO2: 28 mmol/L (ref 22–32)
Calcium: 8.9 mg/dL (ref 8.9–10.3)
Chloride: 98 mmol/L — ABNORMAL LOW (ref 101–111)
Creatinine, Ser: 8.58 mg/dL — ABNORMAL HIGH (ref 0.61–1.24)
GFR calc Af Amer: 7 mL/min — ABNORMAL LOW (ref >60)
GFR calc non Af Amer: 6 mL/min — ABNORMAL LOW (ref >60)
Glucose, Bld: 106 mg/dL — ABNORMAL HIGH (ref 65–99)
Phosphorus: 7 mg/dL — ABNORMAL HIGH (ref 2.5–4.6)
Potassium: 3.4 mmol/L — ABNORMAL LOW (ref 3.5–5.1)
Sodium: 141 mmol/L (ref 135–145)

## 2015-03-12 LAB — GLUCOSE, CAPILLARY
Glucose-Capillary: 126 mg/dL — ABNORMAL HIGH (ref 65–99)
Glucose-Capillary: 149 mg/dL — ABNORMAL HIGH (ref 65–99)
Glucose-Capillary: 243 mg/dL — ABNORMAL HIGH (ref 65–99)
Glucose-Capillary: 206 mg/dL — ABNORMAL HIGH (ref 65–99)

## 2015-03-12 MED ORDER — LIVING WELL WITH DIABETES BOOK
Freq: Once | Status: AC
  Administered 2015-03-12: 15:00:00
  Filled 2015-03-12: qty 1

## 2015-03-12 MED ORDER — POTASSIUM CHLORIDE CRYS ER 20 MEQ PO TBCR
40.0000 meq | EXTENDED_RELEASE_TABLET | Freq: Once | ORAL | Status: AC
  Administered 2015-03-12: 40 meq via ORAL
  Filled 2015-03-12: qty 2

## 2015-03-12 MED ORDER — INSULIN STARTER KIT- SYRINGES (ENGLISH)
1.0000 | Freq: Once | Status: AC
  Administered 2015-03-12: 1
  Filled 2015-03-12: qty 1

## 2015-03-12 NOTE — Progress Notes (Signed)
Patient received Living Well With Diabetes booklet and insulin starter kit.  Patient viewed patient education videos on diabetes 501-510.
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
## 2015-03-12 NOTE — Progress Notes (Signed)
[facility] ROUNDING NOTE   Subjective:   Interval History: recovering acute renal failure  Objective:  Vital signs in last 24 hours:  Temp:  [97.9 F (36.6 C)-98.9 F (37.2 C)] 98.9 F (37.2 C) (02/27 0502) Pulse Rate:  [77-83] 77 (02/27 0502) Resp:  [18-22] 22 (02/27 0502) BP: (119-140)/(58-79) 122/58 mmHg (02/27 0502) SpO2:  [96 %-100 %] 98 % (02/27 0502) Weight:  [105.688 kg (233 lb)] 105.688 kg (233 lb) (02/26 2151)  Weight change: -1.361 kg (-3 lb) Filed Weights   03/09/15 0651 03/10/15 2124 03/11/15 2151  Weight: 111.8 kg (246 lb 7.6 oz) 107.049 kg (236 lb) 105.688 kg (233 lb)    Intake/Output: I/O last 3 completed shifts: In: 70 [P.O.:1100; I.V.:580] Out: 5825 [Urine:5825]   Intake/Output this shift:  Total I/O In: -  Out: 200 [Urine:200]  CVS- RRR RS- CTA ABD- BS present soft non-distended EXT- no edema   Basic Metabolic Panel:  Recent Labs Lab 03/08/15 0655 03/09/15 0535 03/10/15 0538 03/11/15 0507 03/12/15 0501  NA 141 140 140 141 141  K 3.6 4.3 4.1 3.8 3.4*  CL 102 98* 97* 98* 98*  CO2 26 26 25 29 28   GLUCOSE 91 143* 103* 94 106*  BUN 61* 73* 81* 88* 83*  CREATININE 10.32* 10.61* 10.50* 10.33* 8.58*  CALCIUM 8.5* 9.0 9.0 9.2 8.9  PHOS 7.5* 8.1* 8.2* 8.0* 7.0*    Liver Function Tests:  Recent Labs Lab 03/08/15 0655 03/09/15 0535 03/10/15 0538 03/11/15 0507 03/12/15 0501  ALBUMIN 2.7* 2.8* 2.9* 3.0* 2.9*   No results for input(s): LIPASE, AMYLASE in the last 168 hours. No results for input(s): AMMONIA in the last 168 hours.  CBC:  Recent Labs Lab 03/06/15 1138 03/07/15 0836 03/08/15 0655 03/09/15 0540  WBC 5.3  --  4.5  --   HGB 6.3* 7.9* 7.6* 7.6*  HCT 18.6* 23.4* 22.5* 22.7*  MCV 81.2  --  83.3  --   PLT 207  --  198  --     Cardiac Enzymes: No results for input(s): CKTOTAL, CKMB, CKMBINDEX, TROPONINI in the last 168 hours.  BNP: Invalid input(s): POCBNP  CBG:  Recent Labs Lab  03/10/15 2022 03/11/15 0752 03/11/15 1146 03/11/15 1625 03/11/15 2148  GLUCAP 278* 159* 147* 200* 9*    Microbiology: Results for orders placed or performed during the hospital encounter of 02/18/15  MRSA PCR Screening     Status: None   Collection Time: 02/18/15  7:43 AM  Result Value Ref Range Status   MRSA by PCR NEGATIVE NEGATIVE Final    Comment:        The GeneXpert MRSA Assay (FDA approved for NASAL specimens only), is one component of a comprehensive MRSA colonization surveillance program. It is not intended to diagnose MRSA infection nor to guide or monitor treatment for MRSA infections.   Culture, respiratory (NON-Expectorated)     Status: None   Collection Time: 02/20/15  4:14 AM  Result Value Ref Range Status   Specimen Description TRACHEAL ASPIRATE  Final   Special Requests Normal  Final   Gram Stain   Final    ABUNDANT WBC PRESENT, PREDOMINANTLY PMN NO SQUAMOUS EPITHELIAL CELLS SEEN ABUNDANT GRAM POSITIVE COCCI IN PAIRS FEW GRAM POSITIVE RODS FEW GRAM NEGATIVE RODS Performed at [facility]    Culture   Final    FEW STAPHYLOCOCCUS AUREUS Note: RIFAMPIN AND GENTAMICIN SHOULD NOT BE USED AS SINGLE DRUGS FOR TREATMENT OF STAPH INFECTIONS. Performed at [facility]    Report Status 02/23/2015 FINAL  Final   Organism ID, Bacteria STAPHYLOCOCCUS AUREUS  Final      Susceptibility   Staphylococcus aureus - MIC*    CLINDAMYCIN <=0.25 SENSITIVE Sensitive     ERYTHROMYCIN <=0.25 SENSITIVE Sensitive     GENTAMICIN <=0.5 SENSITIVE Sensitive     LEVOFLOXACIN 0.25 SENSITIVE Sensitive     OXACILLIN 0.5 SENSITIVE Sensitive     RIFAMPIN <=0.5 SENSITIVE Sensitive     TRIMETH/SULFA <=10 SENSITIVE Sensitive     VANCOMYCIN 1 SENSITIVE Sensitive     TETRACYCLINE <=1 SENSITIVE Sensitive     MOXIFLOXACIN <=0.25 SENSITIVE Sensitive     * FEW STAPHYLOCOCCUS AUREUS  Culture, blood (Routine X 2) w Reflex to ID Panel     Status: None   Collection Time:  02/20/15  9:56 AM  Result Value Ref Range Status   Specimen Description BLOOD LEFT HAND  Final   Special Requests IN PEDIATRIC BOTTLE 3CC  Final   Culture NO GROWTH 5 DAYS  Final   Report Status 02/25/2015 FINAL  Final  Culture, blood (Routine X 2) w Reflex to ID Panel     Status: None   Collection Time: 02/20/15 10:00 AM  Result Value Ref Range Status   Specimen Description BLOOD LEFT HAND  Final   Special Requests IN PEDIATRIC BOTTLE 3CC  Final   Culture NO GROWTH 5 DAYS  Final   Report Status 02/25/2015 FINAL  Final    Coagulation Studies: No results for input(s): LABPROT, INR in the last 72 hours.  Urinalysis: No results for input(s): COLORURINE, LABSPEC, PHURINE, GLUCOSEU, HGBUR, BILIRUBINUR, KETONESUR, PROTEINUR, UROBILINOGEN, NITRITE, LEUKOCYTESUR in the last 72 hours.  Invalid input(s): APPERANCEUR    Imaging: No results found.   Medications:     . sodium chloride   Intravenous Once  . chlorhexidine gluconate  15 mL Mouth Rinse BID  . diazepam  5 mg Oral QHS  . feeding supplement (NEPRO CARB STEADY)  237 mL Oral BID BM  . fentaNYL  50 mcg Transdermal Q48H  . ferrous sulfate  325 mg Oral BID WC  . FLUoxetine  20 mg Oral Daily  . insulin aspart  0-5 Units Subcutaneous QHS  . insulin aspart  0-9 Units Subcutaneous TID WC  . insulin aspart  5 Units Subcutaneous TID WC  . insulin glargine  35 Units Subcutaneous QHS  . mupirocin cream   Topical Daily  . OLANZapine  5 mg Oral QHS  . pregabalin  75 mg Oral QHS   sodium chloride, sodium chloride, sodium chloride, acetaminophen, albuterol, alteplase, fentaNYL (SUBLIMAZE) injection, lidocaine (PF), lidocaine-prilocaine, ondansetron (ZOFRAN) IV, pentafluoroprop-tetrafluoroeth, senna-docusate, sodium chloride flush, traMADol  Assessment/ Plan:  1. Renal- HD Done 2/10, 2/13, 2/14 , 2/18 and 2/21 was last tx - much better UOP - will continue to watch daily for signs of recovery. had been HD dependent in some form since  2/5- tunneled HD cath placed on 2/10. - not able to do acute HD as OP due to Medicaid status therefore will need to wait til he recovers or stays HD dependent for 3-4 more weeks - today creatinine actually down again-   Will be able to discharge today and will check labs at [place] Wednesday   2. HTN/volume-  No edema  3. Anemia- hgb 6.3 on 2/21 after not being checked for a week- s/p transfusion on 2/21 - iron stores low- repleted and started ESA- hgb 7.6 recently will check labs Wednesday  4. Bones-  Should recover stop binders and vitamin D for now  Will remove permcath and follow up [facility]   LOS: 77 [name] [name] W @TODAY @9 :08 AM

## 2015-03-12 NOTE — Progress Notes (Addendum)
Inpatient Diabetes Program Recommendations  AACE/ADA: New Consensus Statement on Inpatient Glycemic Control (2015)  Target Ranges:  Prepandial:   less than 140 mg/dL      Peak postprandial:   less than 180 mg/dL (1-2 hours)      Critically ill patients:  140 - 180 mg/dL   Note Consult for DM and insulin teaching  Due to patient not having insurance, recommend starting 70/30 insulin at 25 units BID to determine control, which is equivalent to patient's current basal and meal coverage dose. Ordered insulin syringe starter kit, DM educational videos, Living Well with Diabetes booklet, and for the RN to teach drawing up and administration of vial and syringe method with each insulin administration dose. A1c not obtained this admission. Will follow and see patient first thing in the morning to see if he has questions on the information given.  On discharge patient will need Reli On Novolin 70/30 to be able to get the [facility] insulin and a prescription for insulin syringes (order # [id number]).  Thanks,  Tama Headings RN, MSN, [facility] Inpatient Diabetes Coordinator Team Pager [phone] (8a-5p)

## 2015-03-13 LAB — BASIC METABOLIC PANEL
Anion gap: 11 (ref 5–15)
BUN: 75 mg/dL — ABNORMAL HIGH (ref 6–20)
CO2: 29 mmol/L (ref 22–32)
Calcium: 9.2 mg/dL (ref 8.9–10.3)
Chloride: 102 mmol/L (ref 101–111)
Creatinine, Ser: 7.56 mg/dL — ABNORMAL HIGH (ref 0.61–1.24)
GFR calc Af Amer: 8 mL/min — ABNORMAL LOW (ref >60)
GFR calc non Af Amer: 7 mL/min — ABNORMAL LOW (ref >60)
Glucose, Bld: 92 mg/dL (ref 65–99)
Potassium: 3.7 mmol/L (ref 3.5–5.1)
Sodium: 142 mmol/L (ref 135–145)

## 2015-03-13 LAB — GLUCOSE, CAPILLARY: Glucose-Capillary: 88 mg/dL (ref 65–99)

## 2015-03-13 MED ORDER — FREESTYLE LANCETS MISC: Status: DC

## 2015-03-13 MED ORDER — FREESTYLE SYSTEM KIT: 1.0000 | PACK | Freq: Three times a day (TID) | Status: DC

## 2015-03-13 MED ORDER — INSULIN GLARGINE 100 UNIT/ML ~~LOC~~ SOLN: 35.0000 [IU] | Freq: Every day | SUBCUTANEOUS | Status: DC

## 2015-03-13 MED ORDER — FERROUS SULFATE 325 (65 FE) MG PO TABS: 325.0000 mg | ORAL_TABLET | Freq: Two times a day (BID) | ORAL | Status: DC

## 2015-03-13 MED ORDER — INSULIN ASPART 100 UNIT/ML ~~LOC~~ SOLN: SUBCUTANEOUS | Status: DC

## 2015-03-13 MED ORDER — POTASSIUM CHLORIDE CRYS ER 20 MEQ PO TBCR: 40.0000 meq | EXTENDED_RELEASE_TABLET | Freq: Once | ORAL | Status: DC

## 2015-03-13 MED ORDER — DIAZEPAM 5 MG PO TABS: 5.0000 mg | ORAL_TABLET | Freq: Every day | ORAL | Status: DC

## 2015-03-13 MED ORDER — PREGABALIN 75 MG PO CAPS: 75.0000 mg | ORAL_CAPSULE | Freq: Every day | ORAL | Status: DC

## 2015-03-13 MED ORDER — OLANZAPINE 5 MG PO TABS
5.0000 mg | ORAL_TABLET | Freq: Every day | ORAL | Status: DC
Start: 2015-03-13 — End: 2015-03-22

## 2015-03-13 NOTE — Discharge Summary (Signed)
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 site has almost completely healed   Data Review   CBC w Diff:  Lab Results  Component Value Date   WBC 4.5 03/08/2015   HGB 7.6* 03/09/2015   HCT 22.7* 03/09/2015   PLT 198 03/08/2015   LYMPHOPCT 19 02/27/2015   MONOPCT 8 02/27/2015  EOSPCT 3 02/27/2015   BASOPCT 0 02/27/2015    CMP:  Lab Results  Component Value Date   NA 142 03/13/2015   K 3.7 03/13/2015   CL 102 03/13/2015   CO2 29 03/13/2015   BUN 75* 03/13/2015   CREATININE 7.56* 03/13/2015   CREATININE 0.86 10/17/2014   PROT 4.7* 02/21/2015   ALBUMIN 2.9* 03/12/2015   BILITOT 0.8 02/21/2015   ALKPHOS 39 02/21/2015   AST 35 02/21/2015   ALT 33 02/21/2015  .   Total Time in preparing paper work, data evaluation and todays exam - 35 minutes  Thurnell Lose M.D on 03/13/2015 at 9:49 AM  [facility]   Office  [phone]

## 2015-03-13 NOTE — Progress Notes (Signed)
[name] [name] to be D/C'd Home per MD order.  Discussed prescriptions and follow up appointments with the patient. Prescriptions given to patient, medication list explained in detail. Pt verbalized understanding.    Medication List    STOP taking these medications        buPROPion 150 MG 12 hr tablet  Commonly known as:  WELLBUTRIN SR     gabapentin 600 MG tablet  Commonly known as:  [name]     lisinopril-hydrochlorothiazide 20-12.5 MG tablet  Commonly known as:  PRINZIDE,ZESTORETIC     metFORMIN 500 MG tablet  Commonly known as:  GLUCOPHAGE     methadone 10 MG/ML solution  Commonly known as:  DOLOPHINE     QUEtiapine 300 MG 24 hr tablet  Commonly known as:  SEROQUEL XR      TAKE these medications        diazepam 5 MG tablet  Commonly known as:  VALIUM  Take 1 tablet (5 mg total) by mouth at bedtime.     ferrous sulfate 325 (65 FE) MG tablet  Take 1 tablet (325 mg total) by mouth 2 (two) times daily with a meal.     freestyle lancets  For glucose testing every before meals at bedtime. Diagnosis E 11.65.   Can substitute to any accepted brand     glucose monitoring kit monitoring kit  1 each by Does not apply route 4 (four) times daily - after meals and at bedtime. 1 month Diabetic Testing Supplies for QAC-QHS accuchecks.  Any brand OK. Diagnosis E11.65     insulin aspart 100 UNIT/ML injection  Commonly known as:  NOVOLOG  Before each meal 3 times a day, 140-199 - 2 units, 200-250 - 4 units, 251-299 - 6 units,  300-349 - 8 units,  350 or above 10 units. Dispense syringes and needles as needed, Ok to switch to PEN if approved. Substitute to any brand approved. DX DM2, Code E11.65     insulin glargine 100 UNIT/ML injection  Commonly known as:  LANTUS  Inject 0.35 mLs (35 Units total) into the skin at bedtime. Dispense insulin pen if approved, if not dispense as needed syringes and needles for 1 month supply. Can switch to Levemir. Diagnosis E 11.65.     OLANZapine 5 MG tablet  Commonly known as:  ZYPREXA  Take 1 tablet (5 mg total) by mouth at bedtime.     omeprazole 20 MG capsule  Commonly known as:  PRILOSEC  Take 1 capsule (20 mg total) by mouth 2 (two) times daily before a meal.     pregabalin 75 MG capsule  Commonly known as:  LYRICA  Take 1 capsule (75 mg total) by mouth at bedtime.        Filed Vitals:   03/12/15 2020 03/13/15 0535  BP: 151/65 116/63  Pulse: 83 76  Temp: 98.8 F (37.1 C) 98.4 F (36.9 C)  Resp: 18 18     An After Visit Summary was printed and given to the patient. Patient escorted via [place], and D/C home via private auto.  Carole Civil RN [facility] [address] Phone [phone]

## 2015-03-13 NOTE — Progress Notes (Signed)
Patient was educated on how to pull insulin as well as giving his insulin injections. Patient was able to demonstrate how to give insulin injection.

## 2015-03-13 NOTE — Discharge Instructions (Signed)
Follow with Primary MD Lance Bosch, NP in 2-3 days   Get CBC, CMP, 2 view Chest X ray checked  by Primary MD next visit.    Activity: As tolerated with Full fall precautions use walker/cane & assistance as needed   Disposition Home     Diet:  Renal- Low Carb - Check your Weight same time everyday, if you gain over 2 pounds, or you develop in leg swelling, experience more shortness of breath or chest pain, call your Primary MD immediately. Follow Cardiac Low Salt Diet and 1.5 lit/day fluid restriction.   On your next visit with your primary care physician please Get Medicines reviewed and adjusted.   Please request your Prim.MD to go over all Hospital Tests and Procedure/Radiological results at the follow up, please get all Hospital records sent to your Prim MD by signing hospital release before you go home.   If you experience worsening of your admission symptoms, develop shortness of breath, life threatening emergency, suicidal or homicidal thoughts you must seek medical attention immediately by calling 911 or calling your MD immediately  if symptoms less severe.  You Must read complete instructions/literature along with all the possible adverse reactions/side effects for all the Medicines you take and that have been prescribed to you. Take any new Medicines after you have completely understood and accpet all the possible adverse reactions/side effects.   Do not drive, operating heavy machinery, perform activities at heights, swimming or participation in water activities or provide baby sitting services if your were admitted for syncope or siezures until you have seen by Primary MD or a Neurologist and advised to do so again.  Do not drive when taking Pain medications.    Do not take more than prescribed Pain, Sleep and Anxiety Medications  Special Instructions: If you have smoked or chewed Tobacco  in the last 2 yrs please stop smoking, stop any regular Alcohol  and or any  Recreational drug use.  Wear Seat belts while driving.   Please note  You were cared for by a hospitalist during your hospital stay. If you have any questions about your discharge medications or the care you received while you were in the hospital after you are discharged, you can call the unit and asked to speak with the hospitalist on call if the hospitalist that took care of you is not available. Once you are discharged, your primary care physician will handle any further medical issues. Please note that NO REFILLS for any discharge medications will be authorized once you are discharged, as it is imperative that you return to your primary care physician (or establish a relationship with a primary care physician if you do not have one) for your aftercare needs so that they can reassess your need for medications and monitor your lab values.

## 2015-03-13 NOTE — Progress Notes (Addendum)
Inpatient Diabetes Program Recommendations  AACE/ADA: New Consensus Statement on Inpatient Glycemic Control (2015)  Target Ranges:  Prepandial:   less than 140 mg/dL      Peak postprandial:   less than 180 mg/dL (1-2 hours)      Critically ill patients:  140 - 180 mg/dL   Spoke to RN about patient not having insurance. Noted that Dr. Candiss Norse ordered Lantus on discharge. Patient will have to pay several hundred dollars out of pocket for it. Spoke to RN about getting Dr. Candiss Norse to order 70/30 insulin Reli On brand so patient can afford insulin until insurance comes through.   1156 am update on d/c meds:  Spoke with RN in regards to insulins at d/c. Dr. Candiss Norse wanted to keep patient on current regimen. MD expecting pt to get insulins for free at the [facility]. CM mentioned match letter for patient but insulins will not be 100% free. If patient can't afford insulins may want to place him on Reli On Novolin 70/30 25 units BID.  Thanks,  Tama Headings RN, MSN, [facility] Inpatient Diabetes Coordinator Team Pager [phone] (8a-5p)

## 2015-03-14 MED FILL — OLANZapine 5 MG TABS: 5 | 30 days supply | Qty: 30 | Fill #0

## 2015-03-14 MED FILL — FERROUS SULFATE 325 MG TAB: 325 (65 FE) | 30 days supply | Qty: 60 | Fill #0

## 2015-03-14 MED FILL — LANTUS 100 UNITS/ML VIAL: 100 | 28 days supply | Qty: 10 | Fill #0

## 2015-03-14 MED FILL — !NOVOLOG 100UNITS/ML VIAL: 100/ML | 30 days supply | Qty: 10 | Fill #0

## 2015-03-16 MED FILL — GABAPENTIN 600 MG TABLET: 600 | 30 days supply | Qty: 90 | Fill #3

## 2015-03-16 MED FILL — ?METFORMIN HCL 500MG TABLET: 500 | 30 days supply | Qty: 60 | Fill #3

## 2015-03-16 MED FILL — LISINOPRIL-HCTZ 20-12.5 MG: 20-12.5 | 30 days supply | Qty: 30 | Fill #2

## 2015-03-21 ENCOUNTER — Other Ambulatory Visit: Payer: Self-pay | Admitting: Internal Medicine

## 2015-03-21 ENCOUNTER — Ambulatory Visit: Payer: MEDICAID | Attending: Internal Medicine | Admitting: Internal Medicine

## 2015-03-21 ENCOUNTER — Encounter: Payer: Self-pay | Admitting: Internal Medicine

## 2015-03-21 ENCOUNTER — Telehealth: Payer: Self-pay

## 2015-03-21 ENCOUNTER — Ambulatory Visit (HOSPITAL_BASED_OUTPATIENT_CLINIC_OR_DEPARTMENT_OTHER): Payer: Self-pay | Admitting: Clinical

## 2015-03-21 VITALS — BP 101/70 | HR 91 | Temp 98.0°F | Resp 17 | Ht 74.0 in | Wt 210.0 lb

## 2015-03-21 DIAGNOSIS — T50902S Poisoning by unspecified drugs, medicaments and biological substances, intentional self-harm, sequela: Secondary | ICD-10-CM

## 2015-03-21 DIAGNOSIS — Z882 Allergy status to sulfonamides status: Secondary | ICD-10-CM | POA: Insufficient documentation

## 2015-03-21 DIAGNOSIS — I10 Essential (primary) hypertension: Secondary | ICD-10-CM

## 2015-03-21 DIAGNOSIS — Z888 Allergy status to other drugs, medicaments and biological substances status: Secondary | ICD-10-CM | POA: Insufficient documentation

## 2015-03-21 DIAGNOSIS — Z8342 Family history of familial hypercholesterolemia: Secondary | ICD-10-CM | POA: Insufficient documentation

## 2015-03-21 DIAGNOSIS — N184 Chronic kidney disease, stage 4 (severe): Secondary | ICD-10-CM

## 2015-03-21 DIAGNOSIS — Z794 Long term (current) use of insulin: Secondary | ICD-10-CM | POA: Insufficient documentation

## 2015-03-21 DIAGNOSIS — F3130 Bipolar disorder, current episode depressed, mild or moderate severity, unspecified: Secondary | ICD-10-CM

## 2015-03-21 DIAGNOSIS — E1122 Type 2 diabetes mellitus with diabetic chronic kidney disease: Secondary | ICD-10-CM | POA: Insufficient documentation

## 2015-03-21 DIAGNOSIS — Z79899 Other long term (current) drug therapy: Secondary | ICD-10-CM | POA: Insufficient documentation

## 2015-03-21 DIAGNOSIS — F329 Major depressive disorder, single episode, unspecified: Secondary | ICD-10-CM | POA: Insufficient documentation

## 2015-03-21 DIAGNOSIS — E119 Type 2 diabetes mellitus without complications: Secondary | ICD-10-CM

## 2015-03-21 DIAGNOSIS — F332 Major depressive disorder, recurrent severe without psychotic features: Secondary | ICD-10-CM

## 2015-03-21 DIAGNOSIS — E872 Acidosis: Secondary | ICD-10-CM | POA: Insufficient documentation

## 2015-03-21 DIAGNOSIS — Z8249 Family history of ischemic heart disease and other diseases of the circulatory system: Secondary | ICD-10-CM | POA: Insufficient documentation

## 2015-03-21 DIAGNOSIS — Z833 Family history of diabetes mellitus: Secondary | ICD-10-CM | POA: Insufficient documentation

## 2015-03-21 DIAGNOSIS — G4730 Sleep apnea, unspecified: Secondary | ICD-10-CM | POA: Insufficient documentation

## 2015-03-21 DIAGNOSIS — Z915 Personal history of self-harm: Secondary | ICD-10-CM | POA: Insufficient documentation

## 2015-03-21 DIAGNOSIS — I129 Hypertensive chronic kidney disease with stage 1 through stage 4 chronic kidney disease, or unspecified chronic kidney disease: Secondary | ICD-10-CM | POA: Insufficient documentation

## 2015-03-21 LAB — CBC WITH DIFFERENTIAL/PLATELET
Basophils Absolute: 0.1 10*3/uL (ref 0.0–0.1)
Basophils Relative: 1 % (ref 0–1)
Eosinophils Absolute: 0.1 10*3/uL (ref 0.0–0.7)
Eosinophils Relative: 1 % (ref 0–5)
HCT: 28.7 % — ABNORMAL LOW (ref 39.0–52.0)
Hemoglobin: 9.8 g/dL — ABNORMAL LOW (ref 13.0–17.0)
Lymphocytes Relative: 23 % (ref 12–46)
Lymphs Abs: 2.2 10*3/uL (ref 0.7–4.0)
MCH: 27.4 pg (ref 26.0–34.0)
MCHC: 34.1 g/dL (ref 30.0–36.0)
MCV: 80.2 fL (ref 78.0–100.0)
MPV: 10.1 fL (ref 8.6–12.4)
Monocytes Absolute: 0.4 10*3/uL (ref 0.1–1.0)
Monocytes Relative: 4 % (ref 3–12)
Neutro Abs: 6.7 10*3/uL (ref 1.7–7.7)
Neutrophils Relative %: 71 % (ref 43–77)
Platelets: 306 10*3/uL (ref 150–400)
RBC: 3.58 MIL/uL — ABNORMAL LOW (ref 4.22–5.81)
RDW: 14.7 % (ref 11.5–15.5)
WBC: 9.5 10*3/uL (ref 4.0–10.5)

## 2015-03-21 LAB — COMPLETE METABOLIC PANEL WITH GFR
ALT: 24 U/L (ref 9–46)
AST: 19 U/L (ref 10–35)
Albumin: 4.4 g/dL (ref 3.6–5.1)
Alkaline Phosphatase: 62 U/L (ref 40–115)
BUN: 42 mg/dL — ABNORMAL HIGH (ref 7–25)
CO2: 21 mmol/L (ref 20–31)
Calcium: 9.8 mg/dL (ref 8.6–10.3)
Chloride: 101 mmol/L (ref 98–110)
Creat: 2.98 mg/dL — ABNORMAL HIGH (ref 0.70–1.33)
GFR, Est African American: 26 mL/min — ABNORMAL LOW (ref >=60)
GFR, Est Non African American: 23 mL/min — ABNORMAL LOW (ref >=60)
Glucose, Bld: 139 mg/dL — ABNORMAL HIGH (ref 65–99)
Potassium: 3.7 mmol/L (ref 3.5–5.3)
Sodium: 136 mmol/L (ref 135–146)
Total Bilirubin: 0.6 mg/dL (ref 0.2–1.2)
Total Protein: 7.8 g/dL (ref 6.1–8.1)

## 2015-03-21 LAB — GLUCOSE, POCT (MANUAL RESULT ENTRY): POC Glucose: 132 mg/dl — AB (ref 70–99)

## 2015-03-21 LAB — POCT GLYCOSYLATED HEMOGLOBIN (HGB A1C): Hemoglobin A1C: 7.7

## 2015-03-21 MED ORDER — INSULIN PEN NEEDLE 31G X 8 MM MISC: Status: DC

## 2015-03-21 MED ORDER — AMLODIPINE BESYLATE 5 MG PO TABS: 5.0000 mg | ORAL_TABLET | Freq: Every day | ORAL | Status: DC

## 2015-03-21 MED ORDER — INSULIN ASPART 100 UNIT/ML ~~LOC~~ SOLN: SUBCUTANEOUS | Status: DC

## 2015-03-21 MED ORDER — INSULIN GLARGINE 100 UNIT/ML ~~LOC~~ SOLN: 35.0000 [IU] | Freq: Every day | SUBCUTANEOUS | Status: DC

## 2015-03-21 MED ORDER — INSULIN GLARGINE 100 UNITS/ML SOLOSTAR PEN: 15.0000 [IU] | PEN_INJECTOR | Freq: Every day | SUBCUTANEOUS | Status: DC

## 2015-03-21 MED FILL — AMLODIPINE BESYLATE 5 MG TA: 5 | 30 days supply | Qty: 30 | Fill #0

## 2015-03-21 NOTE — Progress Notes (Signed)
ASSESSMENT: Pt currently experiencing Major depression, severe, without psychotic features. Pt needs to f/u with PCP, [facility], and psychiatry. Pt may benefit from psychoeducation regarding coping with major depression.  Stage of Change: contemplative  PLAN: 1. F/U with behavioral health consultant in one week 2. Psychiatric Medications: Zyprexa. 3. Behavioral recommendation(s):   -[facility] pharmacy -March 31 at [facility] appointment -Consider reading educational material regarding coping with symptoms of depression -Contact crisis line, as needed. (Brother also given crisis line number) SUBJECTIVE: Pt. referred by Chari Manning, NP for recent SI:  Pt. reports the following symptoms/concerns: Pt denies SI today, says "I'm really embarrassed about what I did". Pt states he will go to [place] for [facility] meds, and upcoming [facility] appointment, and will come back to talk further at next visit. Duration of problem: About one month Severity: severe  OBJECTIVE: Orientation & Cognition: Oriented x3. Thought processes normal and appropriate to situation. Mood: appropriate. Affect: appropriate Appearance: appropriate Risk of harm to self or others: moderate risk of harm to self (denies SI today), no known risk of harm to others Substance use: none Assessments administered: none  Diagnosis: Major depressive disorder, severe CPT Code: F32.9 -------------------------------------------- Other(s) present in the room: brother  Time spent with patient in exam room: 16 minutes, 12:00-12:16   Depression screen [facility] 2/9 03/21/2015 08/01/2013  Decreased Interest 0 1  Down, Depressed, Hopeless 0 1  PHQ - 2 Score 0 2    GAD 7 : Generalized Anxiety Score 03/21/2015  Nervous, Anxious, on Edge 0  Control/stop worrying 0  Worry too much - different things 0  Trouble relaxing 0  Restless 0  Easily annoyed or irritable 0  Afraid - awful might happen 0  Total GAD 7 Score 0

## 2015-03-21 NOTE — Patient Instructions (Addendum)
STOP GABAPENTIN, Lisinopril-HCTZ, Metformin.

## 2015-03-21 NOTE — Telephone Encounter (Signed)
Spoke with patient and gave him the information as to where To go in the hospital to have his chest xray Patient is aware the order is in the system and he does not need to have an  appointment

## 2015-03-21 NOTE — Progress Notes (Signed)
Patient is here for follow up from the hospital Patient had attempted suicide since being off his bi polar Medications Patient was intubated in the hospital and since having the tubes removed He has since lost his voice and has a deep cough as well Patient also complains of neuropathy in both hands

## 2015-03-21 NOTE — Progress Notes (Signed)
Patient ID: [name] [name], male   DOB: 18-Apr-1960, 55 y.o.   MRN: [id number]  CC: HFU  HPI: [name] [name] is a 55 y.o. male here today for a follow up visit.  Patient has past medical history of diabetes, neuropathy, HTN, recent suicide attempt. Patient was found by his mother at home unresponsive with a slit wrist and empty pill bottles. At that time he was transported to the hospital and treated for metabolic acidosis requiring intubation. Patient was extubated on 2/8 and started back on his Zyprexa and Lyrica. Patient reports that since discharge he has not followed up with Pulmonology or Psychiatry. He states that he has a follow up with [facility] in 2 days. He prefers to stay with [facility]. 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 He does not feel like he had the proper treatment after weaning off Methadone.  Brother is present with patient and states that patient often forgets if he took his medications for the day and often retakes them. Brother has begun to put his pills in a medication box so that he can remember to take medications. Brother also states that patient has been having a hard time with tremors and the insulin vial so he has only used it once. Patient states that he went to [place] and picked up all of his previous medications so he has been taking both Metformin and Lisinopril HCTZ. He also states that he went to [place] and picked up refills of his Seroquel and Wellbutrin. States that Zyprexa was too expensive so he has decided to stay on what he was previously on.  Patients brother states that he went to Nephrology on 3/1 for labs only but has not been called back with results or next steps. He was previously receiving Hemodialysis while hospitalized.   Allergies  Allergen Reactions  . Heparin Other (See Comments)    HIT Ab  negative on 02/20/15, but SRA POSITIVE   . Oxytetracycline Rash  . Sulfonamide Derivatives Rash   Past Medical History  Diagnosis Date  . Depressed bipolar disorder ([place])   . Hypertension   . Diabetes mellitus without complication ([place])   . Sleep apnea   . Diabetic neuropathy ([place])    Current Outpatient Prescriptions on File Prior to Visit  Medication Sig Dispense Refill  . diazepam (VALIUM) 5 MG tablet Take 1 tablet (5 mg total) by mouth at bedtime. 30 tablet 0  . ferrous sulfate 325 (65 FE) MG tablet Take 1 tablet (325 mg total) by mouth 2 (two) times daily with a meal. 60 tablet 0  . glucose monitoring kit (FREESTYLE) monitoring kit 1 each by Does not apply route 4 (four) times daily - after meals and at bedtime. 1 month Diabetic Testing Supplies for QAC-QHS accuchecks.  Any brand OK. Diagnosis E11.65 1 each 1  . insulin aspart (NOVOLOG) 100 UNIT/ML injection Before each meal 3 times a day, 140-199 - 2 units, 200-250 - 4 units, 251-299 - 6 units,  300-349 - 8 units,  350 or above 10 units. Dispense syringes and needles as needed, Ok to switch to PEN if approved. Substitute to any brand approved. DX DM2, Code E11.65 1 vial 12  . insulin glargine (LANTUS) 100 UNIT/ML injection Inject 0.35 mLs (35 Units total) into the skin at bedtime. Dispense insulin pen if approved, if not dispense as  needed syringes and needles for 1 month supply. Can switch to Levemir. Diagnosis E 11.65. 10 mL 0  . Lancets (FREESTYLE) lancets For glucose testing every before meals at bedtime. Diagnosis E 11.65.   Can substitute to any accepted brand 100 each 0  . OLANZapine (ZYPREXA) 5 MG tablet Take 1 tablet (5 mg total) by mouth at bedtime. 30 tablet 0  . omeprazole (PRILOSEC) 20 MG capsule Take 1 capsule (20 mg total) by mouth 2 (two) times daily before a meal. (Patient taking differently: Take 20 mg by mouth daily as needed (indigestion/acid reflux). ) 60 capsule 1  . pregabalin (LYRICA) 75 MG capsule Take 1 capsule  (75 mg total) by mouth at bedtime. 30 capsule 0   No current facility-administered medications on file prior to visit.   Family History  Problem Relation Age of Onset  . Diabetes Mother   . Hyperlipidemia Mother   . Heart disease Father    Social History   Social History  . Marital Status: Single    Spouse Name: N/A  . Number of Children: N/A  . Years of Education: N/A   Occupational History  . Not on file.   Social History Main Topics  . Smoking status: Never Smoker   . Smokeless tobacco: Not on file  . Alcohol Use: No  . Drug Use: No  . Sexual Activity: Not on file   Other Topics Concern  . Not on file   Social History Narrative    Review of Systems  Constitutional: Positive for weight loss.  Neurological: Positive for tremors and headaches.  Psychiatric/Behavioral: Positive for depression. Negative for substance abuse.  All other systems reviewed and are negative.   Objective:   Filed Vitals:   03/21/15 1117  BP: 101/70  Pulse: 91  Temp: 98 F (36.7 C)  Resp: 17    Physical Exam  Constitutional: He is oriented to person, place, and time.  Cardiovascular: Normal rate, regular rhythm and normal heart sounds.   Pulmonary/Chest: Effort normal and breath sounds normal.  Neurological: He is alert and oriented to person, place, and time.  Fine Tremor  Psychiatric:  Depressed     Lab Results  Component Value Date   WBC 4.5 03/08/2015   HGB 7.6* 03/09/2015   HCT 22.7* 03/09/2015   MCV 83.3 03/08/2015   PLT 198 03/08/2015   Lab Results  Component Value Date   CREATININE 7.56* 03/13/2015   BUN 75* 03/13/2015   NA 142 03/13/2015   K 3.7 03/13/2015   CL 102 03/13/2015   CO2 29 03/13/2015    Lab Results  Component Value Date   HGBA1C 7.7 03/21/2015   Lipid Panel     Component Value Date/Time   CHOL 151 01/01/2014 0337   TRIG 196* 01/01/2014 0337   HDL 34* 01/01/2014 0337   CHOLHDL 4.4 01/01/2014 0337   VLDL 39 01/01/2014 0337   LDLCALC  78 01/01/2014 0337       Assessment and plan:   [name] [name] was seen today for hospitalization follow-up.  Diagnoses and all orders for this visit:  Overdose, intentional self-harm, sequela ([place]) -     DG Chest 2 View; Future Patient will need repeat CT of chest but he is uninsured currently. I have encouraged him to apply for financial assistance so that this may be covered.  Patient encouraged to see [facility] ASAP. LCSW has made appointment for patient at [facility] and has attempted to get him in with [facility] health but  he is uninsured.   Bipolar disorder Patient will stop all previous medications (Seroquel and Wellbutrin) as directed on his hospital discharge summary. Social work will Transport planner and make them aware that he should no longer receive refills of this medication. He should only be on Zyprexa currently. Gave list to brother so that he can go home and redo patients pill box.  I will have Social work to send in [facility] for a home visit as well  Type 2 diabetes mellitus without complication, without long-term current use of insulin (HCC) -     Glucose (CBG) -     HgB A1c -     insulin glargine (LANTUS) 100 unit/mL SOPN; Inject 0.15 mLs (15 Units total) into the skin at bedtime. I will discontinue sliding scale insulin and manage him only on Lantus. I have switched him to the Lantus pen since it is easier. Patient demonstrated to me in office how to use pen. Stressed that he should only use directed dose. His A1C was only 7.7 so I have decreased the dose to 15 units daily for now. If his fasting sugars are elevated we may slowly titrate back up. I have discussed with brother that he should be monitored at home to make sure he is only using desired dose since he had a recent suicide attempt.   Essential hypertension -     amLODipine (NORVASC) 5 MG tablet; Take 1 tablet (5 mg total) by mouth daily.  Stressed that he should stop Lisinopril-HCTZ and start Amlodipine. I  have spoke with pharmacy and they are aware of all new meds. I will recheck pressure in 1 week.   CKD (chronic kidney disease), stage 4 (severe) (HCC) -     COMPLETE METABOLIC PANEL WITH GFR -     CBC with Differential Social work will contact Nephrology to see when he should return for follow up. I will repeat labs today since I am unable to get copy of labs he received there. Due to the severity of his kidney damage he is limited to insulin use. See above   Total time spent with patient was 50 minutes. > 50% spent counseling, coordinating care with patient, and speaking with pharmacy, making list of medication for patient and brother.  Return in about 1 week (around 03/28/2015) for [place] log review and 6 weeks f/u.       Lance Bosch, [place] [facility] [phone] 03/21/2015, 11:27 AM

## 2015-03-22 ENCOUNTER — Ambulatory Visit (HOSPITAL_COMMUNITY)
Admission: RE | Admit: 2015-03-22 | Discharge: 2015-03-22 | Disposition: A | Discharge status: Home / Self Care | Payer: MEDICAID | Source: Ambulatory Visit | Attending: Internal Medicine | Admitting: Internal Medicine

## 2015-03-22 ENCOUNTER — Other Ambulatory Visit: Payer: Self-pay | Admitting: Internal Medicine

## 2015-03-22 ENCOUNTER — Telehealth: Payer: Self-pay

## 2015-03-22 ENCOUNTER — Encounter: Payer: Self-pay | Admitting: Clinical

## 2015-03-22 DIAGNOSIS — F3130 Bipolar disorder, current episode depressed, mild or moderate severity, unspecified: Secondary | ICD-10-CM

## 2015-03-22 DIAGNOSIS — T50902S Poisoning by unspecified drugs, medicaments and biological substances, intentional self-harm, sequela: Secondary | ICD-10-CM

## 2015-03-22 MED ORDER — OLANZAPINE 5 MG PO TABS: 5.0000 mg | ORAL_TABLET | Freq: Every day | ORAL | Status: DC

## 2015-03-22 MED ORDER — INSULIN GLARGINE 100 UNITS/ML SOLOSTAR PEN: 15.0000 [IU] | PEN_INJECTOR | Freq: Every day | SUBCUTANEOUS | Status: DC

## 2015-03-22 MED FILL — !LANTUS SOLOSTAR 100UNITS/M: 100 | 30 days supply | Qty: 3 | Fill #0

## 2015-03-22 NOTE — Progress Notes (Signed)
Calls to [phone]) and [facility] [place] [phone]), left HIPPA-compliant messages to return call to [place] at [facility] and [facility] at [phone].  Call to TTS eligibility at [facility]([phone]), no voicemail availability, no message left.   Attempt to f/u on patient care. PCP request for change in [facility] medication to ONLY Zyprexa.

## 2015-03-22 NOTE — Telephone Encounter (Signed)
This Case Manager received communication from Chari Manning, NP regarding patient. She indicated patient had labwork done at [facility] on 03/14/15; however, he has not gotten results and does not know next steps. She also indicated patient needing medication management services. She indicated she wanted to know if patient following up with [facility]. Per Chari Manning, NP and discharge summary patient to remain off of Seroquel. Zyprexa prescribed by Chari Manning, NP.  This Case Manager placed call to [place] [phone]). Informed them that patient was receiving HD while hospitalized for acute renal failure. He was told to follow-up on 03/14/15 for labwork. Informed patient followed up for labwork but has not gotten results and is uncertain if he needs a follow-up appointment with Dr. Justin Mend. Was transferred to [facility], Dr. Jason Nest CMA, voicemail. Requested return call so it can be determined if patient needs continued Nephrology follow-up. Awaiting return call from [place].  This Case Manager case conferenced with Vesta Mixer, SW. She indicated she has called [facility] and left voicemails to discuss patient's behavioral health medications. She indicated she is awaiting return call from [place]. This Case Manager and Vesta Mixer, SW called [place] to confirm patient's next appointment at [facility]. Patient's next appointment scheduled for 03/23/15 at 1400.  This Case Manager called patient and spoke with him about the importance of him getting medication management services. Patient unable to get home health services since he is uninsured. Patient informed he will benefit from meeting with Nicoletta Ba, Clinical Pharmacist at [facility] and [facility]. Informed patient that Nicoletta Ba will also provide diabetes education. Patient verbalized understanding and appointment scheduled for patient on 03/26/15 at 1000. He indicated he had begun keeping a log of his blood sugars. Reminded him to bring ALL  his medications as well as blood glucose log to his appointment. In addition, reminded patient of his upcoming appointment at [facility] on 03/23/15 at 1400. Patient indicated he was aware of his appointment tomorrow at [facility]. Patient appreciative of call.

## 2015-03-22 NOTE — Progress Notes (Signed)
[facility] confirmed pt has appointment for [facility] med management on 03-23-15 at 2pm, and are requesting  PCP medical reason for discontinuing Seroquel.

## 2015-03-22 NOTE — Telephone Encounter (Signed)
This Case Manager re

## 2015-03-23 ENCOUNTER — Telehealth: Payer: Self-pay

## 2015-03-23 NOTE — Telephone Encounter (Signed)
This Case Manager placed an additional call to [facility] [phone])  to determine if patient needing continued Nephrology follow-up.  Attempted to speak with Joann, Dr. Jason Nest CMA; unable to reach as she was in a meeting. Message left for her requesting return call. Awaiting return call.

## 2015-03-26 ENCOUNTER — Telehealth: Payer: Self-pay

## 2015-03-26 ENCOUNTER — Ambulatory Visit (HOSPITAL_BASED_OUTPATIENT_CLINIC_OR_DEPARTMENT_OTHER): Payer: Self-pay | Admitting: Clinical

## 2015-03-26 ENCOUNTER — Ambulatory Visit: Payer: Self-pay | Attending: Internal Medicine | Admitting: Pharmacist

## 2015-03-26 VITALS — BP 123/83 | HR 85

## 2015-03-26 DIAGNOSIS — E119 Type 2 diabetes mellitus without complications: Secondary | ICD-10-CM | POA: Insufficient documentation

## 2015-03-26 DIAGNOSIS — F3130 Bipolar disorder, current episode depressed, mild or moderate severity, unspecified: Secondary | ICD-10-CM

## 2015-03-26 DIAGNOSIS — Z794 Long term (current) use of insulin: Secondary | ICD-10-CM | POA: Insufficient documentation

## 2015-03-26 MED ORDER — INSULIN GLARGINE 100 UNITS/ML SOLOSTAR PEN: 35.0000 [IU] | PEN_INJECTOR | Freq: Every day | SUBCUTANEOUS | Status: DC

## 2015-03-26 NOTE — Telephone Encounter (Signed)
Call placed to [facility] # [phone] and spoke to [place] who stated that there is no appointment scheduled for the patient, he has only had lab work done. The call was then transferred to [facility], [place] for Dr Justin Mend, and a HIPAA compliant voice mail message was left requesting a call back to # [phone] or [phone].  This CM was inquiring about any follow up that is needed for the patient.  Call placed to Kara Pacer, [facility] # [phone] inquiring about services that could be provided for the patient. She recommended that a referral be sent to [facility]uf[facility] Kaufmann - fax # [phone] who oversees their uninsured program.   This CM then met with the patient when he was in the office today to meet with Nicoletta Ba, [facility].  He said that he went to [facility] last week and met with " someone in charge" who reviewed his blood work results  told him that his creatine was 2 and he was "heading in the right direction."  He said the he is going back this week for more blood work.  He then informed this CM that he has the [employer] and would like to see a dentist and he has the contact # on the back of his orange card. He said that he has applied for medicaid and social security disability and is waiting to hear if he has been approved,     Explained to him about the services that [facility] can provide and he was agreeable to have a referral made for him for an assessment and medication management   [facility] referral form completed and sent to Chari Manning, NP for signature.

## 2015-03-26 NOTE — Progress Notes (Signed)
S:    Patient arrives in good spirits for diabetes management.   Patient reports adherence with medications. However, he has not picked up the olanzapine yet. It is ready at the pharmacy now and he can pick it up today.  Current diabetes medications include: Lantus 35 units and Novolog sliding scale. He was supposed to be on 15 units of the Lantus only and no Novolog but the pharmacy told him he could finish what he had before starting the Lantus Solostar Pen and he thought that meant to continue the previous doses as well.   Patient denies hypoglycemic events.   Patient denies nocturia.  Patient reports neuropathy. Patient reports visual changes. Patient reports self foot exams.   Patient reports that he is feeling much better. He has been taking his medications as prescribed and has a lot of help managing his medications through his brother. He has reconnected with his son and his daughter spent her spring break with him. He believes that he became suicidal due to self-titrating down on the methadone.   O:  Lab Results  Component Value Date   HGBA1C 7.7 03/21/2015   Filed Vitals:   03/26/15 1035  BP: 123/83  Pulse: 85    Home fasting CBG: 100s-150s  2 hour post-prandial/random CBG: 150s-200s   A/P: Diabetes currently uncontrolled based on A1c of 7.7 and home CBGs. Patient denies hypoglycemic events and is able to verbalize appropriate hypoglycemia management plan. Patient reports adherence with medication. Control is suboptimal due to sedentary lifestyle and antipsychotic use.  Continue Lantus 35 units. Though he was supposed to be on 15 units of the Lantus, he has been taking 35 units and I am worried that greatly increasing his blood glucose may impact him too much psychologically so I would like to avoid making too many changes. Also, restarting the olanzapine will increase his blood glucose so I want to be prepared for that. Patient instructed to stop the Novolog use. Will  follow patient closely, likely weekly for the next month.  All patient medications were reviewed. Patient has not picked up the olanzapine yet so instructed him to pick it up and start it today. All other medications were accounted for.   Next A1C anticipated June 2017.    Written patient instructions provided.  Total time in face to face counseling 35 minutes.  Follow up in Pharmacist Clinic Visit in 1 week.   Patient seen with Jamie Brookes, PharmD Candidate

## 2015-03-26 NOTE — Progress Notes (Addendum)
Additional challenges with Bipolar disorder include "three failed marriages, because, even though I loved every one of them, I was unable to be faithful"; convincing others of giving him "quarter of a million dollars" for a business venture investment, and then "taking the money to [facility], and losing it all", resulting in a three year prison sentence for fraud. Possible family history: three uncles who "stayed drunk", but who he know suspects they may have been self-medicating from bipolar disorder as well. As a child, very intelligent, but difficult to focus or concentrate; in hindsight, he believes he would have been diagnosed as ADHD as a child today.

## 2015-03-26 NOTE — Patient Instructions (Signed)
Thanks for coming to see [place].  Pick up the olanzapine today!  Keep taking the Lantus (the one you take a night). Stop the Novolog (the insulin with meals).  Come back and see me in 1 week

## 2015-03-26 NOTE — Progress Notes (Signed)
I wrote it on that sheet you asked me about. It was d/c'ed by hospital due to overdose and renal failure. He has chronic kidney disease now. Please make sure they are aware if this. You may send copy of my last office note if he has signed a release of records

## 2015-03-26 NOTE — Progress Notes (Signed)
ASSESSMENT: Pt currently experiencing Bipolar I disorder, most recent episode depressed. Pt needs to f/u with PCP and [facility], along with continuing [place] med management and psychiatric care; pt would benefit from Solution-Focused therapy to cope with functioning with Bipolar I disorder.  Stage of Change: contemplative  PLAN: 1. F/U with behavioral health consultant in one week 2. Psychiatric Medications: Zyprexa 3. Behavioral recommendation(s):   -Continue walking one hour/day -Remember importance of self-care for wellbeing -Continue with [facility] med management -Go to recommended group therapy at [place] to ED and/or contact mobile crisis if SI returns. SUBJECTIVE: Pt. referred by Chari Manning, NP for recent SI:  Pt. reports the following symptoms/concerns: Pt states that he has decided that "it isn't my time to go" and that he believes God has kept him here for a reason. Pt says that he does not feel SI today, He aspires to have "peace in my life" as primary goal; has accepted 2-3 hours sleep/night as the norm for him, as that has been his sleep pattern for at least 30 years. He has been slowly building up to walking an hour/day in the mornings, and feels this has helped him recover, and feels confident in being able to continue daily walks. Pt admits to "rapid cycling" between depression and mania, also his norm for "many years". Pt finds it "very helpful" to be able to talk about this aloud. Duration of problem: Over one month Severity: severe  OBJECTIVE: Orientation & Cognition: Oriented x3. Thought processes normal and appropriate to situation. Mood: appropriate. Affect: appropriate Appearance: appropriate Risk of harm to self or others: Low risk today for harm to self (no stated SI today), no known risk of harm to others Substance use: none Assessments administered: PHQ9: 3/ GAD7: 0  Diagnosis: Bipolar I disorder, most recent episode depressed CPT Code:  F31.30 -------------------------------------------- Other(s) present in the room: none  Time spent with patient in exam room: 60 minutes, 10:45-11:45am   Depression screen [facility] 2/9 03/26/2015 03/21/2015 08/01/2013  Decreased Interest 0 0 1  Down, Depressed, Hopeless 0 0 1  PHQ - 2 Score 0 0 2  Altered sleeping 3 - -  Tired, decreased energy 0 - -  Change in appetite 0 - -  Feeling bad or failure about yourself  0 - -  Trouble concentrating 0 - -  Moving slowly or fidgety/restless 0 - -  Suicidal thoughts 0 - -  PHQ-9 Score 3 - -  Difficult doing work/chores Somewhat difficult - -   GAD 7 : Generalized Anxiety Score 03/21/2015  Nervous, Anxious, on Edge 0  Control/stop worrying 0  Worry too much - different things 0  Trouble relaxing 0  Restless 0  Easily annoyed or irritable 0  Afraid - awful might happen 0  Total GAD 7 Score 0

## 2015-03-27 ENCOUNTER — Telehealth: Payer: Self-pay

## 2015-03-27 NOTE — Telephone Encounter (Signed)
-----   Message from Lance Bosch, NP sent at 03/26/2015 10:02 PM EDT ----- Chest xray is normal

## 2015-03-27 NOTE — Telephone Encounter (Signed)
Tried to contact patient Patient not available Message left on voice mail to return our call 

## 2015-03-28 ENCOUNTER — Telehealth: Payer: Self-pay | Admitting: Clinical

## 2015-03-28 ENCOUNTER — Ambulatory Visit: Payer: Self-pay | Admitting: Pharmacist

## 2015-03-28 NOTE — Telephone Encounter (Signed)
F/u to confirm patient wants to cancel [facility] appointment on 04-13-15; pt confirms he is requesting to cancel [facility] appointment, says he has decided to stay with psychiatric care at [facility]. He is also requesting to be scheduled with [facility] on 3-21-7 at 3pm, after office visit with Nicoletta Ba; pt now scheduled as requested.

## 2015-03-29 NOTE — Progress Notes (Signed)
[facility] Initial Assessment form completed and given to Johnnette Barrios, Development worker, community at [employer] for processing.

## 2015-04-02 ENCOUNTER — Ambulatory Visit: Payer: Self-pay | Admitting: Pharmacist

## 2015-04-03 ENCOUNTER — Ambulatory Visit: Payer: Self-pay | Attending: Internal Medicine | Admitting: Pharmacist

## 2015-04-03 ENCOUNTER — Telehealth: Payer: Self-pay

## 2015-04-03 ENCOUNTER — Ambulatory Visit (HOSPITAL_BASED_OUTPATIENT_CLINIC_OR_DEPARTMENT_OTHER): Payer: Self-pay | Admitting: Clinical

## 2015-04-03 VITALS — BP 116/76 | HR 90

## 2015-04-03 DIAGNOSIS — E119 Type 2 diabetes mellitus without complications: Secondary | ICD-10-CM

## 2015-04-03 DIAGNOSIS — F3130 Bipolar disorder, current episode depressed, mild or moderate severity, unspecified: Secondary | ICD-10-CM

## 2015-04-03 DIAGNOSIS — Z794 Long term (current) use of insulin: Secondary | ICD-10-CM | POA: Insufficient documentation

## 2015-04-03 DIAGNOSIS — E1169 Type 2 diabetes mellitus with other specified complication: Secondary | ICD-10-CM | POA: Insufficient documentation

## 2015-04-03 NOTE — Patient Instructions (Signed)
Thanks for coming to see [place]!  You are doing great!!!  Come back and see me in 2 weeks!

## 2015-04-03 NOTE — Progress Notes (Signed)
S:    Patient arrives in good spirits for diabetes management.   Patient reports adherence with medications. However, he has not picked up the olanzapine yet. It is ready at the pharmacy now and he can pick it up today.  Current diabetes medications include: Lantus 35 units.  Patient reported eating a burger for lunch to reward himself for his good mood. Otherwise he is watching what he eats and avoiding carbs.   Patient denies hypoglycemic events.   Patient denies nocturia.  Patient reports neuropathy. Patient reports visual changes. Patient reports self foot exams.    O:  Lab Results  Component Value Date   HGBA1C 7.7 03/21/2015   Filed Vitals:   04/03/15 1426  BP: 116/76  Pulse: 90    Home fasting CBG: 100s-150s  2 hour post-prandial/random CBG: 150s-200  POCT glucose = 208   A/P: Diabetes currently uncontrolled based on A1c of 7.7 but improved based on home CBGs. Patient denies hypoglycemic events and is able to verbalize appropriate hypoglycemia management plan. Patient reports adherence with medication. Control is suboptimal due to sedentary lifestyle and antipsychotic use.  Continue Lantus 35 units. Patient congratulated on progress. I do not think he needs the rapid acting insulin for now, he is doing very well without it. Patient to continue to monitor blood glucose at home.  Next A1C anticipated June 2017.    Written patient instructions provided.  Total time in face to face counseling 20 minutes.  Follow up in Pharmacist Clinic Visit in 2 week.   Patient seen with Jamie Brookes, PharmD Candidate

## 2015-04-03 NOTE — Progress Notes (Signed)
ASSESSMENT: Pt experiencing Bipolar I disorder, most recent episode depressed. Pt needs to f/u with PCP and psychiatry; would benefit from brief therapeutic interventions to cope with symptoms of Bipolar I disorder.  Stage of Change: contemplative  PLAN: 1. F/U with behavioral health consultant in two weeks 2. Psychiatric Medications: Zyprexa 3. Behavioral recommendation(s):   -Continue looking forward to future plans for self -Plan tto spend time with adult children in next two weeks -Continue with [facility] and PCP appointments  SUBJECTIVE: Pt. referred by Chari Manning for bipolar disorder w past SI:  Pt. reports the following symptoms/concerns: Pt states that he is starting to feel much better, is feeling little to no depression, feels more "even" emotionally, and is looking forward to getting healthier and having new teeth made, so he feels comfortable enough to start dating again. Feels better reconnecting with adult children.  Duration of problem: Over one week Severity: mild (today)  OBJECTIVE: Orientation & Cognition: Oriented x3. Thought processes normal and appropriate to situation. Mood: appropriate. Affect: appropriate Appearance: appropriate Risk of harm to self or others: low risk of harm to self today (no SI today); no known risk of harm to others Substance use: none Assessments administered: Declined today, "I just answered that last week, and I feel better"  Diagnosis: Bipolar I disorder, most recent episode depressed CPT Code: F31.30 -------------------------------------------- Other(s) present in the room: none  Time spent with patient in exam room: 30 minutes, 2:30-3:00pm   Depression screen [facility] 2/9 03/26/2015 03/21/2015 08/01/2013  Decreased Interest 0 0 1  Down, Depressed, Hopeless 0 0 1  PHQ - 2 Score 0 0 2  Altered sleeping 3 - -  Tired, decreased energy 0 - -  Change in appetite 0 - -  Feeling bad or failure about yourself  0 - -  Trouble concentrating 0 - -   Moving slowly or fidgety/restless 0 - -  Suicidal thoughts 0 - -  PHQ-9 Score 3 - -  Difficult doing work/chores Somewhat difficult - -    GAD 7 : Generalized Anxiety Score 03/21/2015  Nervous, Anxious, on Edge 0  Control/stop worrying 0  Worry too much - different things 0  Trouble relaxing 0  Restless 0  Easily annoyed or irritable 0  Afraid - awful might happen 0  Total GAD 7 Score 0

## 2015-04-03 NOTE — Telephone Encounter (Signed)
Referral faxed # [phone] to [facility].

## 2015-04-05 NOTE — Telephone Encounter (Signed)
Pt. Came into facility requesting a dental Referral. Pt. Also stated that he can not afford Lyrica and would like to go back to Gabapentin. Please f/u with pt.

## 2015-04-09 ENCOUNTER — Other Ambulatory Visit: Payer: Self-pay | Admitting: Internal Medicine

## 2015-04-09 DIAGNOSIS — K0889 Other specified disorders of teeth and supporting structures: Secondary | ICD-10-CM

## 2015-04-09 NOTE — Telephone Encounter (Signed)
His kidneys are not good enough for Gabapentin. He can apply for patient assistance with pharmacy for Lyrica. Dental referral has been placed but it takes time to get it

## 2015-04-11 ENCOUNTER — Encounter: Payer: Self-pay | Admitting: Gastroenterology

## 2015-04-11 ENCOUNTER — Telehealth: Payer: Self-pay

## 2015-04-11 NOTE — Telephone Encounter (Signed)
-----   Message from Lance Bosch, NP sent at 04/09/2015  9:15 PM EDT ----- Kidneys look better but still very damaged. Does he have any follow up appointments with Nephrology?

## 2015-04-11 NOTE — Telephone Encounter (Signed)
Called patient this am Patient is not available Message left on voice mail to return our call

## 2015-04-13 ENCOUNTER — Ambulatory Visit (HOSPITAL_COMMUNITY): Payer: Self-pay | Admitting: Psychiatry

## 2015-04-16 ENCOUNTER — Other Ambulatory Visit: Payer: Self-pay | Admitting: Internal Medicine

## 2015-04-16 ENCOUNTER — Encounter: Payer: Self-pay | Admitting: Internal Medicine

## 2015-04-16 ENCOUNTER — Ambulatory Visit: Payer: MEDICAID | Attending: Internal Medicine | Admitting: Internal Medicine

## 2015-04-16 ENCOUNTER — Telehealth: Payer: Self-pay

## 2015-04-16 VITALS — BP 124/83 | HR 90 | Temp 98.1°F | Resp 16 | Ht 74.0 in | Wt 217.4 lb

## 2015-04-16 DIAGNOSIS — E1142 Type 2 diabetes mellitus with diabetic polyneuropathy: Secondary | ICD-10-CM | POA: Insufficient documentation

## 2015-04-16 DIAGNOSIS — Z79899 Other long term (current) drug therapy: Secondary | ICD-10-CM | POA: Insufficient documentation

## 2015-04-16 DIAGNOSIS — G4730 Sleep apnea, unspecified: Secondary | ICD-10-CM | POA: Insufficient documentation

## 2015-04-16 DIAGNOSIS — I10 Essential (primary) hypertension: Secondary | ICD-10-CM | POA: Insufficient documentation

## 2015-04-16 DIAGNOSIS — Z1211 Encounter for screening for malignant neoplasm of colon: Secondary | ICD-10-CM

## 2015-04-16 DIAGNOSIS — Z794 Long term (current) use of insulin: Secondary | ICD-10-CM | POA: Insufficient documentation

## 2015-04-16 DIAGNOSIS — F319 Bipolar disorder, unspecified: Secondary | ICD-10-CM | POA: Insufficient documentation

## 2015-04-16 LAB — GLUCOSE, POCT (MANUAL RESULT ENTRY): POC Glucose: 238 mg/dl — AB (ref 70–99)

## 2015-04-16 MED ORDER — PREGABALIN 75 MG PO CAPS: 75.0000 mg | ORAL_CAPSULE | Freq: Two times a day (BID) | ORAL | Status: DC

## 2015-04-16 MED ORDER — TRAMADOL HCL 50 MG PO TABS
50.0000 mg | ORAL_TABLET | Freq: Two times a day (BID) | ORAL | Status: DC | PRN
Start: 2015-04-16 — End: 2015-06-01

## 2015-04-16 NOTE — Telephone Encounter (Signed)
CMA called patient, patient did not answer. CMA called to f/up with patient for the reason for his OV today. Patient being seen today for referral to the dentist. A referral has been sent, but getting an appt could take some time. Patient will be notified today at his appt.

## 2015-04-16 NOTE — Progress Notes (Signed)
Patient ID: [name] [name], male   DOB: [birth year], 55 y.o.   MRN: [id number]  CC: referral  HPI: [name] [name] is a 55 y.o. male here today for a follow up visit.  Patient has past medical history of diabetes, bipolar disorder, HTN, sleep apnea, and neuropathy. Patient reports that he has been suffering from severe feet tingling and burning that radiates up to his legs as a result of his neuropathy. Patient was previously on Gabapentin but it was discontinued due to his severe kidney damage after a suicide attempt by medication overdose. He was discharged from the hospital on Lyrica but patient has been unable to afford medication. As a result of the pain patient has been unable to sleep and has not slept well in the past 4 weeks.   Allergies  Allergen Reactions  . Heparin Other (See Comments)    HIT Ab negative on 02/20/15, but SRA POSITIVE   . Oxytetracycline Rash  . Sulfonamide Derivatives Rash   Past Medical History  Diagnosis Date  . Depressed bipolar disorder ([place])   . Hypertension   . Diabetes mellitus without complication ([place])   . Sleep apnea   . Diabetic neuropathy [facility])    Current Outpatient Prescriptions on File Prior to Visit  Medication Sig Dispense Refill  . amLODipine (NORVASC) 5 MG tablet Take 1 tablet (5 mg total) by mouth daily. 90 tablet 3  . glucose monitoring kit (FREESTYLE) monitoring kit 1 each by Does not apply route 4 (four) times daily - after meals and at bedtime. 1 month Diabetic Testing Supplies for QAC-QHS accuchecks.  Any brand OK. Diagnosis E11.65 1 each 1  . insulin glargine (LANTUS) 100 unit/mL SOPN Inject 0.35 mLs (35 Units total) into the skin at bedtime. 15 mL 11  . Insulin Pen Needle (B-D ULTRAFINE III SHORT PEN) 31G X 8 MM MISC Check blood sugar TID and QHS 100 each 0  . Lancets (FREESTYLE) lancets For glucose testing every before meals at bedtime. Diagnosis E 11.65.   Can substitute to any accepted brand 100 each 0  . OLANZapine (ZYPREXA) 5  MG tablet Take 1 tablet (5 mg total) by mouth at bedtime. 30 tablet 0   No current facility-administered medications on file prior to visit.   Family History  Problem Relation Age of Onset  . Diabetes Mother   . Hyperlipidemia Mother   . Heart disease Father    Social History   Social History  . Marital Status: Single    Spouse Name: N/A  . Number of Children: N/A  . Years of Education: N/A   Occupational History  . Not on file.   Social History Main Topics  . Smoking status: Never Smoker   . Smokeless tobacco: Not on file  . Alcohol Use: No  . Drug Use: No  . Sexual Activity: Not on file   Other Topics Concern  . Not on file   Social History Narrative    Review of Systems: Other than what is stated in HPI, all other systems are negative.   Objective:   Filed Vitals:   04/16/15 1227  BP: 124/83  Pulse: 90  Temp: 98.1 F (36.7 C)  Resp: 16    Physical Exam  Constitutional: He is oriented to person, place, and time.  Cardiovascular: Normal rate, regular rhythm and normal heart sounds.   Pulmonary/Chest: Effort normal and breath sounds normal.  Musculoskeletal: He exhibits no edema.  Neurological: He is alert and oriented to person, place, and  time.  Skin: Skin is warm and dry.  Psychiatric:  anxious     Lab Results  Component Value Date   WBC 9.5 03/21/2015   HGB 9.8* 03/21/2015   HCT 28.7* 03/21/2015   MCV 80.2 03/21/2015   PLT 306 03/21/2015   Lab Results  Component Value Date   CREATININE 2.98* 03/21/2015   BUN 42* 03/21/2015   NA 136 03/21/2015   K 3.7 03/21/2015   CL 101 03/21/2015   CO2 21 03/21/2015    Lab Results  Component Value Date   HGBA1C 7.7 03/21/2015   Lipid Panel     Component Value Date/Time   CHOL 151 01/01/2014 0337   TRIG 196* 01/01/2014 0337   HDL 34* 01/01/2014 0337   CHOLHDL 4.4 01/01/2014 0337   VLDL 39 01/01/2014 0337   LDLCALC 78 01/01/2014 0337       Assessment and plan:   [name] [name] was seen today  for referral.  Diagnoses and all orders for this visit:  Diabetic polyneuropathy associated with type 2 diabetes mellitus ([place]) -     Begin pregabalin (LYRICA) 75 MG capsule; Take 1 capsule (75 mg total) by mouth 2 (two) times daily. -     traMADol (ULTRAM) 50 MG tablet; Take 1 tablet (50 mg total) by mouth every 12 (twelve) hours as needed for severe pain. I will give patient 15 Tramadol to hold him over until he is able to get Lyrica. 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   Type 2 diabetes mellitus with diabetic polyneuropathy, unspecified long term insulin use status ([facility]) -     Glucose (CBG) Stable. Will follow up on DM in 2 months.   Return in about 2 months (around 06/16/2015) for Diabetes Mellitus.       Lance Bosch, [facility] [phone] 04/16/2015, 12:24 PM

## 2015-04-16 NOTE — Progress Notes (Signed)
Patient's here for pain management and Diabetes neuropathy.  Patient experiencing constant feet and leg pain, which he described as unbearable.  Patient states that he hasn't had a good night rest x4wks now.  Patient requesting Rx of Lyrica. Patient reports that Methadone worked for him also.

## 2015-04-17 ENCOUNTER — Ambulatory Visit (HOSPITAL_BASED_OUTPATIENT_CLINIC_OR_DEPARTMENT_OTHER): Payer: Self-pay | Admitting: Clinical

## 2015-04-17 ENCOUNTER — Ambulatory Visit: Payer: Self-pay | Attending: Internal Medicine | Admitting: Pharmacist

## 2015-04-17 DIAGNOSIS — Z794 Long term (current) use of insulin: Secondary | ICD-10-CM | POA: Insufficient documentation

## 2015-04-17 DIAGNOSIS — E118 Type 2 diabetes mellitus with unspecified complications: Secondary | ICD-10-CM | POA: Insufficient documentation

## 2015-04-17 DIAGNOSIS — E1142 Type 2 diabetes mellitus with diabetic polyneuropathy: Secondary | ICD-10-CM

## 2015-04-17 DIAGNOSIS — F3176 Bipolar disorder, in full remission, most recent episode depressed: Secondary | ICD-10-CM

## 2015-04-17 MED ORDER — TRUE METRIX METER W/DEVICE KIT: PACK | Status: DC

## 2015-04-17 MED ORDER — GLUCOSE BLOOD VI STRP: ORAL_STRIP | Status: DC

## 2015-04-17 MED ORDER — INSULIN GLARGINE 100 UNITS/ML SOLOSTAR PEN: 35.0000 [IU] | PEN_INJECTOR | Freq: Every day | SUBCUTANEOUS | Status: DC

## 2015-04-17 MED ORDER — TRUEPLUS LANCETS 28G MISC: Status: DC

## 2015-04-17 MED FILL — TRUE METRIX TEST STRIP: 25 days supply | Qty: 100 | Fill #0

## 2015-04-17 MED FILL — TRUEplus LANCETS 28G MISC: 25 days supply | Qty: 100 | Fill #0

## 2015-04-17 NOTE — Patient Instructions (Addendum)
Thanks for coming to see me! You are really doing a great job!  Remember that carbohydrates turn to sugar so focus on the carb numbers for what you eat  Meals should have 60 g of carbohydrates or less. Snacks should have 15 g of carbohydrates or less.  Make an appointment to see your new doctor in 1 month. You can follow up with me if you need to after that.  Basic Carbohydrate Counting for Diabetes Mellitus Carbohydrate counting is a method for keeping track of the amount of carbohydrates you eat. Eating carbohydrates naturally increases the level of sugar (glucose) in your blood, so it is important for you to know the amount that is okay for you to have in every meal. Carbohydrate counting helps keep the level of glucose in your blood within normal limits. The amount of carbohydrates allowed is different for every person. A dietitian can help you calculate the amount that is right for you. Once you know the amount of carbohydrates you can have, you can count the carbohydrates in the foods you want to eat. Carbohydrates are found in the following foods:  Grains, such as breads and cereals.  Dried beans and soy products.  Starchy vegetables, such as potatoes, peas, and corn.  Fruit and fruit juices.  Milk and yogurt.  Sweets and snack foods, such as cake, cookies, candy, chips, soft drinks, and fruit drinks. CARBOHYDRATE COUNTING There are two ways to count the carbohydrates in your food. You can use either of the methods or a combination of both. Reading the "Nutrition Facts" on [place] The "Nutrition Facts" is an area that is included on the labels of almost all packaged food and beverages in the [place]. It includes the serving size of that food or beverage and information about the nutrients in each serving of the food, including the grams (g) of carbohydrate per serving.  Decide the number of servings of this food or beverage that you will be able to eat or drink. Multiply  that number of servings by the number of grams of carbohydrate that is listed on the label for that serving. The total will be the amount of carbohydrates you will be having when you eat or drink this food or beverage. Learning Standard Serving Sizes of Food When you eat food that is not packaged or does not include "Nutrition Facts" on the label, you need to measure the servings in order to count the amount of carbohydrates.A serving of most carbohydrate-rich foods contains about 15 g of carbohydrates. The following list includes serving sizes of carbohydrate-rich foods that provide 15 g ofcarbohydrate per serving:   1 slice of bread (1 oz) or 1 six-inch tortilla.    of a hamburger bun or English muffin.  4-6 crackers.   cup unsweetened dry cereal.    cup hot cereal.   cup rice or pasta.    cup mashed potatoes or  of a large baked potato.  1 cup fresh fruit or one small piece of fruit.    cup canned or frozen fruit or fruit juice.  1 cup milk.   cup plain fat-free yogurt or yogurt sweetened with artificial sweeteners.   cup cooked dried beans or starchy vegetable, such as peas, corn, or potatoes.  Decide the number of standard-size servings that you will eat. Multiply that number of servings by 15 (the grams of carbohydrates in that serving). For example, if you eat 2 cups of strawberries, you will have eaten 2 servings  and 30 g of carbohydrates (2 servings x 15 g = 30 g). For foods such as soups and casseroles, in which more than one food is mixed in, you will need to count the carbohydrates in each food that is included. EXAMPLE OF CARBOHYDRATE COUNTING Sample Dinner  3 oz chicken breast.   cup of brown rice.   cup of corn.  1 cup milk.   1 cup strawberries with sugar-free whipped topping.  Carbohydrate Calculation Step 1: Identify the foods that contain carbohydrates:   Rice.   Corn.   Milk.   Strawberries. Step 2:Calculate the number of  servings eaten of each:   2 servings of rice.   1 serving of corn.   1 serving of milk.   1 serving of strawberries. Step 3: Multiply each of those number of servings by 15 g:   2 servings of rice x 15 g = 30 g.   1 serving of corn x 15 g = 15 g.   1 serving of milk x 15 g = 15 g.   1 serving of strawberries x 15 g = 15 g. Step 4: Add together all of the amounts to find the total grams of carbohydrates eaten: 30 g + 15 g + 15 g + 15 g = 75 g.   This information is not intended to replace advice given to you by your health care provider. Make sure you discuss any questions you have with your health care provider.   Document Released: 12/30/2004 Document Revised: 01/20/2014 Document Reviewed: 11/26/2012 Elsevier Interactive Patient Education [employer].

## 2015-04-17 NOTE — Progress Notes (Signed)
S:    Patient arrives in good spirits for diabetes management.   Patient reports adherence with medications.   Current diabetes medications include: Lantus 35 units.  Patient reported that he has really been trying to watch the amount of sugar on nutrition labels. He denies ever looking at the carb amounts.   Patient denies hypoglycemic events.   Patient denies nocturia.  Patient reports neuropathy -[name] [name] started Lyrica yesterday and added tramadol to help with the pain. Patient reports visual changes. Patient reports self foot exams.   He reports having to use his mother's meter because his a broken.    O:  Lab Results  Component Value Date   HGBA1C 7.7 03/21/2015   There were no vitals filed for this visit.  Home fasting CBG: 100s-150s  2 hour post-prandial/random CBG: 150s-200 (highest was reading in clinic yesterday - 238)   A/P: Diabetes currently uncontrolled based on A1c of 7.7 but improved based on home CBGs. Patient denies hypoglycemic events and is able to verbalize appropriate hypoglycemia management plan. Patient reports adherence with medication. Control is suboptimal due to sedentary lifestyle and antipsychotic use.  Continue Lantus 35 units. Patient congratulated on progress. I do not think he needs the rapid acting insulin for now, he is doing very well without it. Patient to continue to monitor blood glucose at home. I have reordered his meter and supplies so that he can have his own.  Spent a lot of time educating patient on carbohydrates, their impact on blood glucose, and carbohydrate counting. Patient had no idea that carbohydrates would increase his blood glucose. Patient was able to teach back this education and will start to monitor his carbohydrate intake. I think that this will be enough to get him to his A1c goal.   Next A1C anticipated June 2017.    Written patient instructions provided and written educational material on carbohydrate counting  and meal planning was given to patient..  Total time in face to face counseling 20 minutes.  Follow up in Pharmacist Clinic Visit as needed but next visit should be with new provider.

## 2015-04-17 NOTE — Progress Notes (Signed)
ASSESSMENT: Pt currently experiencing Depressed bipolar I disorder in remission. Pt needs to f/u with PCP and psychiatry; would benefit from brief therapeutic interventions regarding coping with ongoing remission of depression.  Stage of Change: contemplative  PLAN: 1. F/U with behavioral health consultant in as needed 2. Psychiatric Medications: Zyprexa 3. Behavioral recommendation(s):   -Continue with short-term goal of obtaining dentures (Next step: go to next dental appointment) -[place] drive [place] with brother -Consider planning fishing trip within next month -Attend next [place] appointment on 04-20-15 -Continue walking daily  SUBJECTIVE: Pt. referred by Chari Manning, NP for bipolar disorder w past SI:  Pt. reports the following symptoms/concerns: Pt says he has decided to get dentures first, as a short-term goal, and then getting implants as a long-term goal. He is looking forward to a long drive with his brother tomorrow to place new flowers [place], and has decided to take up fishing again rather than worry about dating. He is continuing with [facility] appointments, and says he does not want to ever contemplate suicide again, feels life is better now; walking at least one hour daily is helping. Duration of problem: At least two weeks Severity: mild(today)  OBJECTIVE: Orientation & Cognition: Oriented x3. Thought processes normal and appropriate to situation. Mood: appropriate. Affect: appropriate Appearance: appropriate Risk of harm to self or others: low risk of harm to self today (no SI), no known risk of harm to others Substance use: none Assessments administered: Says no today, completed assessments yesterday  Diagnosis: Depressed bipolar I disorder in remission CPT Code: F31.76 -------------------------------------------- Other(s) present in the room: none  Time spent with patient in exam room: 30 minutes, 2:30-3:00pm   Depression screen  [facility] 2/9 04/16/2015 03/26/2015 03/21/2015 08/01/2013  Decreased Interest 0 0 0 1  Down, Depressed, Hopeless 0 0 0 1  PHQ - 2 Score 0 0 0 2  Altered sleeping - 3 - -  Tired, decreased energy - 0 - -  Change in appetite - 0 - -  Feeling bad or failure about yourself  - 0 - -  Trouble concentrating - 0 - -  Moving slowly or fidgety/restless - 0 - -  Suicidal thoughts - 0 - -  PHQ-9 Score - 3 - -  Difficult doing work/chores - Somewhat difficult - -    GAD 7 : Generalized Anxiety Score 03/21/2015  Nervous, Anxious, on Edge 0  Control/stop worrying 0  Worry too much - different things 0  Trouble relaxing 0  Restless 0  Easily annoyed or irritable 0  Afraid - awful might happen 0  Total GAD 7 Score 0

## 2015-04-20 MED FILL — !LANTUS SOLOSTAR 100UNITS/M: 100 | 20 days supply | Qty: 3 | Fill #1

## 2015-04-24 ENCOUNTER — Ambulatory Visit (INDEPENDENT_AMBULATORY_CARE_PROVIDER_SITE_OTHER): Payer: No Typology Code available for payment source | Admitting: Podiatry

## 2015-04-24 ENCOUNTER — Encounter: Payer: Self-pay | Admitting: Podiatry

## 2015-04-24 DIAGNOSIS — G629 Polyneuropathy, unspecified: Secondary | ICD-10-CM

## 2015-04-24 DIAGNOSIS — L84 Corns and callosities: Secondary | ICD-10-CM

## 2015-04-24 NOTE — Progress Notes (Signed)
Subjective:     Patient ID: [name] [name], male   DOB: 31-Jan-1960, 55 y.o.   MRN: [id number]  HPI this patient presents the office with a painful callus noted on the inside of his big toe, right foot. He states he was examined here 12 weeks ago and was dispensed padding after the callus was to provided. He says the callus has reformed and he sees blood coming from the callus. Each and every day. He states he's not having any pain or discomfort from the site, but is concerned about the thickness of the callus and bleeding. This patient is a diabetic with neuropathy. He presents the office today for continued evaluation and treatment of this lesion on the big toe, right foot   Review of Systems     Objective:   Physical Exam GENERAL APPEARANCE: Alert, conversant. Appropriately groomed. No acute distress.  VASCULAR: Pedal pulses are  palpable at  [facility] and PT bilateral.  Capillary refill time is immediate to all digits,  Normal temperature gradient.    NEUROLOGIC: sensation is diminished  to 5.07 monofilament at 5/5 sites bilateral.  Light touch is intact bilateral, Muscle strength normal.  MUSCULOSKELETAL: acceptable muscle strength, tone and stability bilateral.  Intrinsic muscluature intact bilateral.  Rectus appearance of foot and digits noted bilateral.   DERMATOLOGIC: skin color, texture, and turgor are within normal limits.  No preulcerative lesions or ulcers  are seen, no interdigital maceration noted.  No open lesions present.  Digital nails are asymptomatic. No drainage noted. Blackened callus under the medial aspect IPJ right hallux.  Mild redness noted dorsally above his callus.      Assessment:  Callus right hallux.     Plan:  Debridement of callus.  Thin layer of skin noted under callus right hallux.  No drainage or pus noted upon debridement.  RTC 10 weeks.   Gardiner Barefoot DPM

## 2015-05-02 ENCOUNTER — Ambulatory Visit: Payer: Self-pay | Admitting: Internal Medicine

## 2015-05-03 ENCOUNTER — Ambulatory Visit: Payer: Self-pay | Admitting: Family Medicine

## 2015-05-24 MED FILL — !LANTUS SOLOSTAR 100UNITS/M: 100 | 20 days supply | Qty: 3 | Fill #2

## 2015-05-24 MED FILL — AMLODIPINE BESYLATE 5 MG TA: 5 | 30 days supply | Qty: 30 | Fill #1

## 2015-05-28 ENCOUNTER — Ambulatory Visit (HOSPITAL_COMMUNITY)
Admission: EM | Admit: 2015-05-28 | Discharge: 2015-05-28 | Disposition: A | Discharge status: Home / Self Care | Payer: MEDICAID | Attending: Family Medicine | Admitting: Family Medicine

## 2015-05-28 ENCOUNTER — Encounter (HOSPITAL_COMMUNITY): Payer: Self-pay | Admitting: Emergency Medicine

## 2015-05-28 DIAGNOSIS — G6289 Other specified polyneuropathies: Secondary | ICD-10-CM

## 2015-05-28 DIAGNOSIS — K029 Dental caries, unspecified: Secondary | ICD-10-CM

## 2015-05-28 MED ORDER — TRAMADOL HCL 50 MG PO TABS: 50.0000 mg | ORAL_TABLET | Freq: Four times a day (QID) | ORAL | Status: DC | PRN

## 2015-05-28 MED ORDER — CLINDAMYCIN HCL 300 MG PO CAPS: 300.0000 mg | ORAL_CAPSULE | Freq: Four times a day (QID) | ORAL | Status: DC

## 2015-05-28 MED ORDER — PREGABALIN 75 MG PO CAPS: 75.0000 mg | ORAL_CAPSULE | Freq: Two times a day (BID) | ORAL | Status: DC

## 2015-05-28 NOTE — ED Provider Notes (Signed)
CSN: [id number]     Arrival date & time 05/28/15  13 History   First MD Initiated Contact with Patient 05/28/15 1818     Chief Complaint  Patient presents with  . Dental Pain  . Peripheral Neuropathy   (Consider location/radiation/quality/duration/timing/severity/associated sxs/prior Treatment) HPI History obtained from patient:  Pt presents with the cc of:2 complaints peripheral neuropathy and dental pain Duration of symptoms: Neuropathy symptoms of been for several years due to his diabetes. Until pain is been over the last week. Treatment prior to arrival: For neuropathy patient was taken Lyrica and tramadol which he is currently out of that has an appointment for Friday to get a renewal of his prescriptions. Dental pain he has been using Tylenol and ibuprofen without significant relief of his symptoms. Context: Ongoing history of peripheral neuropathy secondary to diabetes. Poor dental hygiene secondary to lack of insurance and no dentist. Other symptoms include: None Pain score: 6 FAMILY HISTORY: Diabetes in his mother SOCIAL HISTORY: Nonsmoker  Past Medical History  Diagnosis Date  . Depressed bipolar disorder ([place])   . Hypertension   . Diabetes mellitus without complication ([place])   . Sleep apnea   . Diabetic neuropathy [facility])    Past Surgical History  Procedure Laterality Date  . Spine surgery  2010  . Shoulder arthroscopy with rotator cuff repair Right 2009   Family History  Problem Relation Age of Onset  . Diabetes Mother   . Hyperlipidemia Mother   . Heart disease Father    Social History  Substance Use Topics  . Smoking status: Never Smoker   . Smokeless tobacco: None  . Alcohol Use: No    Review of Systems ROS +'ve dental pain  Denies: HEADACHE, NAUSEA, ABDOMINAL PAIN, CHEST PAIN, CONGESTION, DYSURIA, SHORTNESS OF BREATH  Allergies  Heparin; Oxytetracycline; and Sulfonamide derivatives  Home Medications   Prior to Admission medications    Medication Sig Start Date End Date Taking? Authorizing Provider  amLODipine (NORVASC) 5 MG tablet Take 1 tablet (5 mg total) by mouth daily. 03/21/15   Lance Bosch, NP  Blood Glucose Monitoring Suppl (TRUE METRIX METER) w/Device KIT Use as directed 04/17/15   Tresa Garter, MD  glucose blood (TRUE METRIX BLOOD GLUCOSE TEST) test strip Use as instructed 04/17/15   Tresa Garter, MD  insulin glargine (LANTUS) 100 unit/mL SOPN Inject 0.35 mLs (35 Units total) into the skin at bedtime. 04/17/15   Tresa Garter, MD  Insulin Pen Needle (B-D ULTRAFINE III SHORT PEN) 31G X 8 MM MISC Check blood sugar TID and QHS 03/21/15   Lance Bosch, NP  OLANZapine (ZYPREXA) 5 MG tablet Take 1 tablet (5 mg total) by mouth at bedtime. 03/22/15   Lance Bosch, NP  pregabalin (LYRICA) 75 MG capsule Take 1 capsule (75 mg total) by mouth 2 (two) times daily. 04/16/15   Lance Bosch, NP  traMADol (ULTRAM) 50 MG tablet Take 1 tablet (50 mg total) by mouth every 12 (twelve) hours as needed for severe pain. 04/16/15   Lance Bosch, NP  TRUEPLUS LANCETS 28G MISC Use as needed for up to 4 times daily testing of blood sugar 04/17/15   Tresa Garter, MD   Meds Ordered and Administered this Visit  Medications - No data to display  BP 155/77 mmHg  Pulse 95  Temp(Src) 98.8 F (37.1 C) (Oral)  Resp 16  SpO2 99% No data found.   Physical Exam NURSES NOTES AND VITAL SIGNS REVIEWED.  CONSTITUTIONAL: Well developed, well nourished, no acute distress HEENT: normocephalic, atraumatic,Dental exam reveals extremely poor dentition there are multiple dental caries with gingivitis noted also. No palpable or visible abscess. The pharynx are in extremely poor repair EYES: Conjunctiva normal NECK:normal ROM, supple, no adenopathy PULMONARY:No respiratory distress, normal effort ABDOMINAL: Soft, ND, NT BS+, No CVAT MUSCULOSKELETAL: Normal ROM of all extremities,  SKIN: warm and dry without rash PSYCHIATRIC: Mood and  affect, behavior are normal  ED Course  Procedures (including critical care time)  Labs Review Labs Reviewed - No data to display  Imaging Review No results found.   Visual Acuity Review  Right Eye Distance:   Left Eye Distance:   Bilateral Distance:    Right Eye Near:   Left Eye Near:    Bilateral Near:     Prescription for clindamycin, Lyrica, tramadol.  MDM  new 1. Pain due to dental caries   2. Other polyneuropathy ([place])   chronic     Patient is reassured that there are no issues that require transfer to higher level of care at this time or additional tests. Patient is advised to continue home symptomatic treatment. Patient is advised that if there are new or worsening symptoms to attend the emergency department, contact primary care provider, or return to UC. Instructions of care provided discharged home in stable condition.    THIS NOTE WAS GENERATED USING A VOICE RECOGNITION SOFTWARE PROGRAM. ALL REASONABLE EFFORTS  WERE MADE TO PROOFREAD THIS DOCUMENT FOR ACCURACY.  I have verbally reviewed the discharge instructions with the patient. A printed AVS was given to the patient.  All questions were answered prior to discharge.      Konrad Felix, [place] 05/28/15 Valerie Roys

## 2015-05-28 NOTE — Discharge Instructions (Signed)
Dental Caries Dental caries is tooth decay. This decay can cause a hole in teeth (cavity) that can get bigger and deeper over time. HOME CARE  Brush and floss your teeth. Do this at least two times a day.  Use a fluoride toothpaste.  Use a mouth rinse if told by your dentist or doctor.  Eat less sugary and starchy foods. Drink less sugary drinks.  Avoid snacking often on sugary and starchy foods. Avoid sipping often on sugary drinks.  Keep regular checkups and cleanings with your dentist.  Use fluoride supplements if told by your dentist or doctor.  Allow fluoride to be applied to teeth if told by your dentist or doctor.   This information is not intended to replace advice given to you by your health care provider. Make sure you discuss any questions you have with your health care provider.   Document Released: 10/09/2007 Document Revised: 01/20/2014 Document Reviewed: 01/02/2012 Elsevier Interactive Patient Education 2016 Elsevier Inc. Diabetic Neuropathy Diabetic neuropathy is a nerve disease or nerve damage that is caused by diabetes mellitus. About half of all people with diabetes mellitus have some form of nerve damage. Nerve damage is more common in those who have had diabetes mellitus for many years and who generally have not had good control of their blood sugar (glucose) level. Diabetic neuropathy is a common complication of diabetes mellitus. There are three common types of diabetic neuropathy and a fourth type that is less common and less understood:   Peripheral neuropathy--This is the most common type of diabetic neuropathy. It causes damage to the nerves of the feet and legs first and then eventually the hands and arms.The damage affects the ability to sense touch.  Autonomic neuropathy--This type causes damage to the autonomic nervous system, which controls the following functions:  Heartbeat.  Body temperature.  Blood  pressure.  Urination.  Digestion.  Sweating.  Sexual function.  Focal neuropathy--Focal neuropathy can be painful and unpredictable and occurs most often in older adults with diabetes mellitus. It involves a specific nerve or one area and often comes on suddenly. It usually does not cause long-term problems.  Radiculoplexus neuropathy-- Sometimes called lumbosacral radiculoplexus neuropathy, radiculoplexus neuropathy affects the nerves of the thighs, hips, buttocks, or legs. It is more common in people with type 2 diabetes mellitus and in older men. It is characterized by debilitating pain, weakness, and atrophy, usually in the thigh muscles. CAUSES  The cause of peripheral, autonomic, and focal neuropathies is diabetes mellitus that is uncontrolled and high glucose levels. The cause of radiculoplexus neuropathy is unknown. However, it is thought to be caused by inflammation related to uncontrolled glucose levels. SIGNS AND SYMPTOMS  Peripheral Neuropathy Peripheral neuropathy develops slowly over time. When the nerves of the feet and legs no longer work there may be:   Burning, stabbing, or aching pain in the legs or feet.  Inability to feel pressure or pain in your feet. This can lead to:  Thick calluses over pressure areas.  Pressure sores.  Ulcers.  Foot deformities.  Reduced ability to feel temperature changes.  Muscle weakness. Autonomic Neuropathy The symptoms of autonomic neuropathy vary depending on which nerves are affected. Symptoms may include:  Problems with digestion, such as:  Feeling sick to your stomach (nausea).  Vomiting.  Bloating.  Constipation.  Diarrhea.  Abdominal pain.  Difficulty with urination. This occurs if you lose your ability to sense when your bladder is full. Problems include:  Urine leakage (incontinence).  Inability to  empty your bladder completely (retention).  Rapid or irregular heartbeat (palpitations).  Blood pressure  drops when you stand up (orthostatic hypotension). When you stand up you may feel:  Dizzy.  Weak.  Faint.  In men, inability to attain and maintain an erection.  In women, vaginal dryness and problems with decreased sexual desire and arousal.  Problems with body temperature regulation.  Increased or decreased sweating. Focal Neuropathy  Abnormal eye movements or abnormal alignment of both eyes.  Weakness in the wrist.  Foot drop. This results in an inability to lift the foot properly and abnormal walking or foot movement.  Paralysis on one side of your face (Bell palsy).  Chest or abdominal pain. Radiculoplexus Neuropathy  Sudden, severe pain in your hip, thigh, or buttocks.  Weakness and wasting of thigh muscles.  Difficulty rising from a seated position.  Abdominal swelling.  Unexplained weight loss (usually more than 10 lb [4.5 kg]). DIAGNOSIS  Peripheral Neuropathy Your senses may be tested. Sensory function testing can be done with:  A light touch using a monofilament.  A vibration with tuning fork.  A sharp sensation with a pin prick. Other tests that can help diagnose neuropathy are:  Nerve conduction velocity. This test checks the transmission of an electrical current through a nerve.  Electromyography. This shows how muscles respond to electrical signals transmitted by nearby nerves.  Quantitative sensory testing. This is used to assess how your nerves respond to vibrations and changes in temperature. Autonomic Neuropathy Diagnosis is often based on reported symptoms. Tell your health care provider if you experience:   Dizziness.   Constipation.   Diarrhea.   Inappropriate urination or inability to urinate.   Inability to get or maintain an erection.  Tests that may be done include:   Electrocardiography or Holter monitor. These are tests that can help show problems with the heart rate or heart rhythm.   An X-ray exam may be  done. Focal Neuropathy Diagnosis is made based on your symptoms and what your health care provider finds during your exam. Other tests may be done. They may include:  Nerve conduction velocities. This checks the transmission of electrical current through a nerve.  Electromyography. This shows how muscles respond to electrical signals transmitted by nearby nerves.  Quantitative sensory testing. This test is used to assess how your nerves respond to vibration and changes in temperature. Radiculoplexus Neuropathy  Often the first thing is to eliminate any other issue or problems that might be the cause, as there is no stick test for diagnosis.  X-ray exam of your spine and lumbar region.  Spinal tap to rule out cancer.  MRI to rule out other lesions. TREATMENT  Once nerve damage occurs, it cannot be reversed. The goal of treatment is to keep the disease or nerve damage from getting worse and affecting more nerve fibers. Controlling your blood glucose level is the key. Most people with radiculoplexus neuropathy see at least a partial improvement over time. You will need to keep your blood glucose and HbA1c levels in the target range determined by your health care provider. Things that help control blood glucose levels include:   Blood glucose monitoring.   Meal planning.   Physical activity.   Diabetes medicine.  Over time, maintaining lower blood glucose levels helps lessen symptoms. Sometimes, prescription pain medicine is needed. HOME CARE INSTRUCTIONS:  Do not smoke.  Keep your blood glucose level in the range that you and your health care provider have determined acceptable  for you.  Keep your blood pressure level in the range that you and your health care provider have determined acceptable for you.  Eat a well-balanced diet.  Be physically active every day. Include strength training and balance exercises.  Protect your feet.  Check your feet every day for sores,  cuts, blisters, or signs of infection.  Wear padded socks and supportive shoes. Use orthotic inserts, if necessary.  Regularly check the insides of your shoes for worn spots. Make sure there are no rocks or other items inside your shoes before you put them on. SEEK MEDICAL CARE IF:   You have burning, stabbing, or aching pain in the legs or feet.  You are unable to feel pressure or pain in your feet.  You develop problems with digestion such as:  Nausea.  Vomiting.  Bloating.  Constipation.  Diarrhea.  Abdominal pain.  You have difficulty with urination, such as:  Incontinence.  Retention.  You have palpitations.  You develop orthostatic hypotension. When you stand up you may feel:  Dizzy.  Weak.  Faint.  You cannot attain and maintain an erection (in men).  You have vaginal dryness and problems with decreased sexual desire and arousal (in women).  You have severe pain in your thighs, legs, or buttocks.  You have unexplained weight loss.   This information is not intended to replace advice given to you by your health care provider. Make sure you discuss any questions you have with your health care provider.   Document Released: 03/10/2001 Document Revised: 01/20/2014 Document Reviewed: 06/10/2012 Elsevier Interactive Patient Education [employer].

## 2015-05-28 NOTE — ED Notes (Signed)
main complaint is neuropathy.  Reports he has been treated with tramadol and it helped.  Patient reports provider valerie keck no longer at pcp office and was his provider.  Patient going to have appt on 5/19.   Patient also complains of dental pain-multiple teeth broken

## 2015-06-01 ENCOUNTER — Encounter: Payer: Self-pay | Admitting: Family Medicine

## 2015-06-01 ENCOUNTER — Ambulatory Visit: Payer: MEDICAID | Attending: Family Medicine | Admitting: Family Medicine

## 2015-06-01 VITALS — BP 152/91 | HR 85 | Temp 98.3°F | Resp 16 | Ht 74.0 in | Wt 224.0 lb

## 2015-06-01 DIAGNOSIS — Z114 Encounter for screening for human immunodeficiency virus [HIV]: Secondary | ICD-10-CM

## 2015-06-01 DIAGNOSIS — S0100XD Unspecified open wound of scalp, subsequent encounter: Secondary | ICD-10-CM

## 2015-06-01 DIAGNOSIS — B353 Tinea pedis: Secondary | ICD-10-CM

## 2015-06-01 DIAGNOSIS — L84 Corns and callosities: Secondary | ICD-10-CM | POA: Insufficient documentation

## 2015-06-01 DIAGNOSIS — E1142 Type 2 diabetes mellitus with diabetic polyneuropathy: Secondary | ICD-10-CM | POA: Insufficient documentation

## 2015-06-01 DIAGNOSIS — E785 Hyperlipidemia, unspecified: Secondary | ICD-10-CM | POA: Insufficient documentation

## 2015-06-01 DIAGNOSIS — I10 Essential (primary) hypertension: Secondary | ICD-10-CM | POA: Insufficient documentation

## 2015-06-01 DIAGNOSIS — Z1159 Encounter for screening for other viral diseases: Secondary | ICD-10-CM

## 2015-06-01 DIAGNOSIS — Z794 Long term (current) use of insulin: Secondary | ICD-10-CM

## 2015-06-01 DIAGNOSIS — Z79899 Other long term (current) drug therapy: Secondary | ICD-10-CM | POA: Insufficient documentation

## 2015-06-01 LAB — HIV ANTIBODY (ROUTINE TESTING W REFLEX): HIV 1&2 Ab, 4th Generation: NONREACTIVE

## 2015-06-01 LAB — LIPID PANEL
Cholesterol: 181 mg/dL (ref 125–200)
HDL: 33 mg/dL — ABNORMAL LOW (ref >=40)
LDL Cholesterol: 95 mg/dL (ref <130)
Total CHOL/HDL Ratio: 5.5 Ratio — ABNORMAL HIGH (ref <=5.0)
Triglycerides: 267 mg/dL — ABNORMAL HIGH (ref <150)
VLDL: 53 mg/dL — ABNORMAL HIGH (ref <30)

## 2015-06-01 LAB — BASIC METABOLIC PANEL WITH GFR
BUN: 18 mg/dL (ref 7–25)
CO2: 26 mmol/L (ref 20–31)
Calcium: 9.8 mg/dL (ref 8.6–10.3)
Chloride: 103 mmol/L (ref 98–110)
Creat: 0.93 mg/dL (ref 0.70–1.33)
GFR, Est African American: >89 mL/min (ref >=60)
GFR, Est Non African American: >89 mL/min (ref >=60)
Glucose, Bld: 196 mg/dL — ABNORMAL HIGH (ref 65–99)
Potassium: 4.2 mmol/L (ref 3.5–5.3)
Sodium: 141 mmol/L (ref 135–146)

## 2015-06-01 LAB — GLUCOSE, POCT (MANUAL RESULT ENTRY): POC Glucose: 209 mg/dl — AB (ref 70–99)

## 2015-06-01 MED ORDER — TRAMADOL HCL 50 MG PO TABS: 50.0000 mg | ORAL_TABLET | Freq: Four times a day (QID) | ORAL | Status: DC | PRN

## 2015-06-01 MED ORDER — PREGABALIN 75 MG PO CAPS: 75.0000 mg | ORAL_CAPSULE | Freq: Two times a day (BID) | ORAL | Status: DC

## 2015-06-01 MED ORDER — KETOCONAZOLE 2 % EX CREA: 1.0000 "application " | TOPICAL_CREAM | Freq: Two times a day (BID) | CUTANEOUS | Status: DC

## 2015-06-01 MED ORDER — INSULIN GLARGINE 100 UNIT/ML SOLOSTAR PEN: 35.0000 [IU] | PEN_INJECTOR | Freq: Every day | SUBCUTANEOUS | Status: DC

## 2015-06-01 MED ORDER — AMLODIPINE BESYLATE 10 MG PO TABS: 10.0000 mg | ORAL_TABLET | Freq: Every day | ORAL | Status: DC

## 2015-06-01 NOTE — Progress Notes (Signed)
Subjective:  Patient ID: [name] [name], male    DOB: [birth year]  Age: 55 y.o. MRN: [id number]  CC: Diabetes   HPI [name] [name] presents for   1. CHRONIC DIABETES dx in 2015   Disease Monitoring  Blood Sugar Ranges: 98-170  Polyuria: no   Visual problems: no   Medication Compliance: yes  Medication Side Effects  Hypoglycemia: no   Preventitive Health Care  Eye Exam: due   Foot Exam: done today    2. Diabetic neuropathy: burning in feet. Numbness in 4th and 5th toes and in fingers at times. Has pre ulcerative callus on R medial great toe. Followed by podiatry. Requesting tramadol for pain control. Has tried gabapentin without relief. Could not afford lyrica in the past.   3. HTN: taking Norvasc 5 mg daily. Developed AKI in setting of barbiturate OD and acute respiratory  failure in 2-03/2014 with Cr up to 13.6. No HA, CP, SOB or edema.   Patient Active Problem List   Diagnosis Date Noted  . Pre-ulcerative calluses 06/01/2015    Priority: High  . Bipolar I disorder, most recent episode depressed ([place]) 02/21/2015    Priority: High  . Type 2 diabetes mellitus with diabetic polyneuropathy ([place]) 06/09/2014    Priority: High  . Depressed bipolar disorder ([place]) 01/01/2014    Priority: High  . Hyperlipidemia 05/11/2008    Priority: High  . Essential hypertension 05/11/2008    Priority: High  . DEPRESSION 05/11/2008    Priority: Medium  . GERD 05/11/2008    Priority: Medium  . Pressure ulcer 02/22/2015  . HERPES ZOSTER 12/05/2008  . OSTEOARTHRITIS 05/11/2008  . LOW BACK PAIN 05/11/2008  . COLONIC POLYPS, HX OF 05/11/2008    Outpatient Prescriptions Prior to Visit  Medication Sig Dispense Refill  . amLODipine (NORVASC) 5 MG tablet Take 1 tablet (5 mg total) by mouth daily. 90 tablet 3  . Blood Glucose Monitoring Suppl (TRUE METRIX METER) w/Device KIT Use as directed 1 kit 0  . glucose blood (TRUE METRIX BLOOD GLUCOSE TEST) test strip Use as instructed 100 each  12  . insulin glargine (LANTUS) 100 unit/mL SOPN Inject 0.35 mLs (35 Units total) into the skin at bedtime. 15 mL 3  . Insulin Pen Needle (B-D ULTRAFINE III SHORT PEN) 31G X 8 MM MISC Check blood sugar TID and QHS 100 each 0  . OLANZapine (ZYPREXA) 5 MG tablet Take 1 tablet (5 mg total) by mouth at bedtime. 30 tablet 0  . pregabalin (LYRICA) 75 MG capsule Take 1 capsule (75 mg total) by mouth 2 (two) times daily. 60 capsule 1  . traMADol (ULTRAM) 50 MG tablet Take 1 tablet (50 mg total) by mouth every 12 (twelve) hours as needed for severe pain. 15 tablet 0  . TRUEPLUS LANCETS 28G MISC Use as needed for up to 4 times daily testing of blood sugar 100 each 12  . clindamycin (CLEOCIN) 300 MG capsule Take 1 capsule (300 mg total) by mouth every 6 (six) hours. (Patient not taking: Reported on 06/01/2015) 30 capsule 0  . pregabalin (LYRICA) 75 MG capsule Take 1 capsule (75 mg total) by mouth 2 (two) times daily. 14 capsule 0  . traMADol (ULTRAM) 50 MG tablet Take 1 tablet (50 mg total) by mouth every 6 (six) hours as needed. 20 tablet 0   No facility-administered medications prior to visit.    ROS Review of Systems  Constitutional: Negative for fever, chills, fatigue and unexpected weight change.  Eyes: Negative for visual disturbance.  Respiratory: Negative for cough and shortness of breath.   Cardiovascular: Negative for chest pain, palpitations and leg swelling.  Gastrointestinal: Negative for nausea, vomiting, abdominal pain, diarrhea, constipation and blood in stool.  Endocrine: Negative for polydipsia, polyphagia and polyuria.  Musculoskeletal: Positive for myalgias, back pain and arthralgias. Negative for gait problem and neck pain.  Skin: Positive for wound. Negative for rash.  Allergic/Immunologic: Negative for immunocompromised state.  Neurological: Positive for numbness. Speech difficulty: in fingers and toes   Hematological: Negative for adenopathy. Does not bruise/bleed easily.    Psychiatric/Behavioral: Positive for dysphoric mood. Negative for suicidal ideas and sleep disturbance. The patient is not nervous/anxious.     Objective:  BP 152/91 mmHg  Pulse 85  Temp(Src) 98.3 F (36.8 C) (Oral)  Resp 16  Ht '6\' 2"'$  (1.88 m)  Wt 224 lb (101.606 kg)  BMI 28.75 kg/m2  SpO2 98%  BP/Weight 06/01/2015 1/61/0960 04/17/4096  Systolic BP 119 147 829  Diastolic BP 91 77 83  Wt. (Lbs) 224 - 217.4  BMI 28.75 - 27.9    Physical Exam  Constitutional: He appears well-developed and well-nourished. No distress.  HENT:  Head: Normocephalic and atraumatic.  Neck: Normal range of motion. Neck supple.  Cardiovascular: Normal rate, regular rhythm, normal heart sounds and intact distal pulses.   Pulmonary/Chest: Effort normal and breath sounds normal.  Musculoskeletal: He exhibits no edema.       Feet:  Neurological: He is alert.  Skin: Skin is warm and dry. No rash noted. No erythema.     Psychiatric: He has a normal mood and affect.    Lab Results  Component Value Date   HGBA1C 7.7 03/21/2015  CBG 209   Assessment & Plan:   [name] [name] was seen today for diabetes.  Diagnoses and all orders for this visit:  Type 2 diabetes mellitus with diabetic polyneuropathy, with long-term current use of insulin (HCC) -     POCT glucose (manual entry) -     Insulin Glargine (LANTUS SOLOSTAR) 100 UNIT/ML Solostar Pen; Inject 35 Units into the skin daily. -     BASIC METABOLIC PANEL WITH GFR -     Lipid Panel -     Ambulatory referral to Ophthalmology -     atorvastatin (LIPITOR) 40 MG tablet; Take 1 tablet (40 mg total) by mouth daily.  Diabetic polyneuropathy associated with type 2 diabetes mellitus (HCC) -     Discontinue: traMADol (ULTRAM) 50 MG tablet; Take 1 tablet (50 mg total) by mouth every 6 (six) hours as needed for severe pain. -     Discontinue: pregabalin (LYRICA) 75 MG capsule; Take 1 capsule (75 mg total) by mouth 2 (two) times daily. For PASS -     pregabalin  (LYRICA) 75 MG capsule; Take 1 capsule (75 mg total) by mouth 2 (two) times daily. For PASS -     traMADol (ULTRAM) 50 MG tablet; Take 1 tablet (50 mg total) by mouth every 6 (six) hours as needed for severe pain.  Essential hypertension -     amLODipine (NORVASC) 10 MG tablet; Take 1 tablet (10 mg total) by mouth daily.  Scalp wound, subsequent encounter -     Ambulatory referral to Dermatology  Screening for HIV (human immunodeficiency virus) -     HIV antibody (with reflex)  Need for hepatitis C screening test -     Hepatitis C antibody, reflex  Pre-ulcerative calluses  Tinea pedis of both feet -  ketoconazole (NIZORAL) 2 % cream; Apply 1 application topically 2 (two) times daily.  Hyperlipidemia -     atorvastatin (LIPITOR) 40 MG tablet; Take 1 tablet (40 mg total) by mouth daily.  Other orders -     Hepatitis C antibody   No orders of the defined types were placed in this encounter.    Follow-up: No Follow-up on file.   Boykin Nearing MD

## 2015-06-01 NOTE — Progress Notes (Signed)
F/U DM Glucose running 98- 170 Taking medication as prescribed  Feet burning due to neuropathy  Medication refills Rx Tramadol  No suicidal thoughts in the past two weeks  Pani scale # 3 No tobacco user

## 2015-06-01 NOTE — Patient Instructions (Addendum)
[name] [name] was seen today for diabetes.  Diagnoses and all orders for this visit:  Type 2 diabetes mellitus with diabetic polyneuropathy, with long-term current use of insulin (HCC) -     POCT glucose (manual entry) -     Insulin Glargine (LANTUS SOLOSTAR) 100 UNIT/ML Solostar Pen; Inject 35 Units into the skin daily. -     BASIC METABOLIC PANEL WITH GFR -     Lipid Panel -     Ambulatory referral to Ophthalmology  Diabetic polyneuropathy associated with type 2 diabetes mellitus (HCC) -     traMADol (ULTRAM) 50 MG tablet; Take 1 tablet (50 mg total) by mouth every 6 (six) hours as needed for severe pain. -     pregabalin (LYRICA) 75 MG capsule; Take 1 capsule (75 mg total) by mouth 2 (two) times daily. For PASS  Essential hypertension -     amLODipine (NORVASC) 10 MG tablet; Take 1 tablet (10 mg total) by mouth daily.  Scalp wound, subsequent encounter -     Ambulatory referral to Dermatology  Screening for HIV (human immunodeficiency virus) -     HIV antibody (with reflex)  Need for hepatitis C screening test -     Hepatitis C antibody, reflex   F/u in 6 weeks for L great toe pre-ulcerative callus and diabetes   Dr. Adrian Blackwater

## 2015-06-01 NOTE — Progress Notes (Signed)
Corrected the year to 2017

## 2015-06-02 LAB — HEPATITIS C ANTIBODY: HCV Ab: NEGATIVE

## 2015-06-03 ENCOUNTER — Encounter: Payer: Self-pay | Admitting: Family Medicine

## 2015-06-03 DIAGNOSIS — S0100XA Unspecified open wound of scalp, initial encounter: Secondary | ICD-10-CM | POA: Insufficient documentation

## 2015-06-03 DIAGNOSIS — E1142 Type 2 diabetes mellitus with diabetic polyneuropathy: Secondary | ICD-10-CM | POA: Insufficient documentation

## 2015-06-03 DIAGNOSIS — B353 Tinea pedis: Secondary | ICD-10-CM | POA: Insufficient documentation

## 2015-06-03 MED ORDER — ATORVASTATIN CALCIUM 40 MG PO TABS: 40.0000 mg | ORAL_TABLET | Freq: Every day | ORAL | Status: DC

## 2015-06-03 MED ORDER — METFORMIN HCL ER 500 MG PO TB24: 1000.0000 mg | ORAL_TABLET | Freq: Every day | ORAL | Status: DC

## 2015-06-03 NOTE — Assessment & Plan Note (Signed)
Uncontrolled diabetes with neuropathy  Refilled tramadol for control of pain with neuropathy Adding lyrica, patient to apply for PASS  Continue lantus 35 U daily Add back metformin 500 mg BID since GFR has normalized

## 2015-06-03 NOTE — Assessment & Plan Note (Signed)
HTN, BP elevated  Increase norvasc to 10 mg daily

## 2015-06-03 NOTE — Assessment & Plan Note (Signed)
Slow healing wound on posterior scalp Derm referral

## 2015-06-13 ENCOUNTER — Ambulatory Visit: Payer: Self-pay

## 2015-06-15 ENCOUNTER — Telehealth: Payer: Self-pay | Admitting: *Deleted

## 2015-06-15 NOTE — Telephone Encounter (Signed)
Date of birth verified by pt  Lab results given  Negative HIV and Hep C  Cr and GFR normalized  Elevated TG and low HDL add Lipitor 40 mg daily  Pt verbalized understanding

## 2015-06-15 NOTE — Telephone Encounter (Signed)
-----   Message from Boykin Nearing, MD sent at 06/03/2015  9:03 PM EDT ----- Screening HIV and Hep C negative Elevated CBG on BMP Cr and GFR have both normalized since acute kidney injury Elevated TGs and low HDL on lipids add Lipitor 40 mg daily

## 2015-06-18 MED FILL — !LANTUS SOLOSTAR 100UNITS/M: 100 | 8 days supply | Qty: 3 | Fill #0

## 2015-06-22 ENCOUNTER — Ambulatory Visit: Payer: MEDICAID | Attending: Family Medicine

## 2015-07-03 ENCOUNTER — Ambulatory Visit (INDEPENDENT_AMBULATORY_CARE_PROVIDER_SITE_OTHER): Payer: No Typology Code available for payment source | Admitting: Podiatry

## 2015-07-03 ENCOUNTER — Encounter: Payer: Self-pay | Admitting: Podiatry

## 2015-07-03 DIAGNOSIS — M79674 Pain in right toe(s): Secondary | ICD-10-CM

## 2015-07-03 DIAGNOSIS — M79675 Pain in left toe(s): Secondary | ICD-10-CM

## 2015-07-03 DIAGNOSIS — E1140 Type 2 diabetes mellitus with diabetic neuropathy, unspecified: Secondary | ICD-10-CM

## 2015-07-03 DIAGNOSIS — E1149 Type 2 diabetes mellitus with other diabetic neurological complication: Secondary | ICD-10-CM

## 2015-07-03 DIAGNOSIS — L89891 Pressure ulcer of other site, stage 1: Secondary | ICD-10-CM

## 2015-07-03 DIAGNOSIS — L97512 Non-pressure chronic ulcer of other part of right foot with fat layer exposed: Secondary | ICD-10-CM

## 2015-07-03 DIAGNOSIS — B351 Tinea unguium: Secondary | ICD-10-CM

## 2015-07-03 NOTE — Progress Notes (Signed)
Subjective:     Patient ID: [name] [name], male   DOB: August 03, 1960, 55 y.o.   MRN: [id number]  HPI this patient presents to the office with chief complaint of long thick painful callus on the bottom of his right big toe. He also says that he is here for preventative foot care services for treatment on his toenails. He says his nails are painful walking and wearing his shoes. This patient states the big callus started out as a small callused area that he picked  the tissue off. He says following that he developed an infection to the big toe, right foot. Since that time, the callus continues to thicken and become painful and not heal. He presents the office today for preventative foot care services, but I am concerned about the thickness of the callus right big toe. Patient has been diagnosed with type 2 diabetes with diabetic, probably neuropathy.  He does admit there is occasional drainage from the callus.  Finally he was evaluated by myself  Months ago and there was only callus in the absence of an ulcer. Review of Systems     Objective:   Physical Exam GENERAL APPEARANCE: Alert, conversant. Appropriately groomed. No acute distress.  VASCULAR: Pedal pulses are  palpable at  [facility] and PT bilateral.  Capillary refill time is immediate to all digits,  Normal temperature gradient.  Digital hair growth is present bilateral  NEUROLOGIC: sensation is diminished  to 5.07 monofilament at 5/5 sites bilateral.  Light touch is intact bilateral, Muscle strength normal.  MUSCULOSKELETAL: acceptable muscle strength, tone and stability bilateral.  Intrinsic muscluature intact bilateral.  Rectus appearance of foot and digits noted bilateral.   DERMATOLOGIC: there is excessive callus formation medial aspect right hallux.  The necrotic tissue is mixture of yellow and blackened necrotic tissue. Debridement of necrotic tissue reveals 8 mm. X 10 mm.  Ulcer.  No evidence of redness or pus noted.  No malodor noted.       Assessment:     Onychomycosis Hallux B/l.  Diabetic ulcer right hallux.      Plan:     Debridement of Nails  Debridement of ulcer right great toe.  Neosporin/DSD.   Home instructions given for care of ulcer.  RTC 10 days for my reevaluation of diabetic ulcer.      Gardiner Barefoot DPM

## 2015-07-13 ENCOUNTER — Encounter: Payer: Self-pay | Admitting: Gastroenterology

## 2015-07-13 ENCOUNTER — Ambulatory Visit: Payer: No Typology Code available for payment source | Admitting: Podiatry

## 2015-07-20 ENCOUNTER — Ambulatory Visit (INDEPENDENT_AMBULATORY_CARE_PROVIDER_SITE_OTHER): Payer: No Typology Code available for payment source | Admitting: Podiatry

## 2015-07-20 ENCOUNTER — Encounter: Payer: Self-pay | Admitting: Podiatry

## 2015-07-20 VITALS — BP 131/73 | HR 78 | Resp 14

## 2015-07-20 DIAGNOSIS — E1149 Type 2 diabetes mellitus with other diabetic neurological complication: Secondary | ICD-10-CM

## 2015-07-20 DIAGNOSIS — L97512 Non-pressure chronic ulcer of other part of right foot with fat layer exposed: Secondary | ICD-10-CM

## 2015-07-20 DIAGNOSIS — L89891 Pressure ulcer of other site, stage 1: Secondary | ICD-10-CM

## 2015-07-20 DIAGNOSIS — E1140 Type 2 diabetes mellitus with diabetic neuropathy, unspecified: Secondary | ICD-10-CM

## 2015-07-20 NOTE — Progress Notes (Signed)
Subjective:     Patient ID: [name] [name], male   DOB: [birth year], 55 y.o.   MRN: [id number]  HPI's patient presents the office for follow-up for diabetic ulcer on his right big toe. He was seen 2 weeks ago and the callus over the ulcer to block was debrided. He was given home instructions for soaks and bandaging. He presents the office today wearing no bandage on his big toe. He states that he developed a significant callus over the last 2 weeks, not having any pain or discomfort. He does have diabetes with neuropathy says that there is still drainage and bleeding from the ulcer on the big toe, right foot   Review of Systems     Objective:   Physical Exam GENERAL APPEARANCE: Alert, conversant. Appropriately groomed. No acute distress.  VASCULAR: Pedal pulses are  palpable at  [facility] and PT bilateral.  Capillary refill time is immediate to all digits,  Normal temperature gradient.  Digital hair growth is present bilateral  NEUROLOGIC: sensation is diminished  to 5.07 monofilament at 5/5 sites bilateral.  Light touch is intact bilateral, Muscle strength normal.  MUSCULOSKELETAL: acceptable muscle strength, tone and stability bilateral.  Intrinsic muscluature intact bilateral.  Rectus appearance of foot and digits noted bilateral.   DERMATOLOGIC: Excessive callus formation over 2 mm. X 10 mm.  Ulcer.  No drainage or pus or malodor.      Assessment:     Diabetic ulcer right hallux     Plan:     Debridement of hallux right hallux.  Neosporin/DSD  Dispersion padding right hallux.  RTC 3 weeks.   Gardiner Barefoot DPM

## 2015-08-03 MED FILL — LANTUS SOLOSTAR 100 UNITS/M: 100 | 8 days supply | Qty: 3 | Fill #1

## 2015-08-03 MED FILL — ?AMLODIPINE BESYLATE 10 MG: 10 | 30 days supply | Qty: 30 | Fill #0

## 2015-08-06 ENCOUNTER — Ambulatory Visit (HOSPITAL_COMMUNITY)
Admission: EM | Admit: 2015-08-06 | Discharge: 2015-08-06 | Disposition: A | Discharge status: Home / Self Care | Payer: Self-pay | Attending: Emergency Medicine | Admitting: Emergency Medicine

## 2015-08-06 ENCOUNTER — Encounter (HOSPITAL_COMMUNITY): Payer: Self-pay | Admitting: Emergency Medicine

## 2015-08-06 DIAGNOSIS — G8929 Other chronic pain: Secondary | ICD-10-CM

## 2015-08-06 DIAGNOSIS — K089 Disorder of teeth and supporting structures, unspecified: Secondary | ICD-10-CM

## 2015-08-06 MED ORDER — HYDROCODONE-ACETAMINOPHEN 5-325 MG PO TABS: 1.0000 | ORAL_TABLET | Freq: Four times a day (QID) | ORAL | 0 refills | Status: DC | PRN

## 2015-08-06 NOTE — Discharge Instructions (Signed)
I'm sorry you are having so much pain. I provided a prescription for hydrocodone to use as needed for severe pain. Follow-up with your dentist as scheduled next month.
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
## 2015-08-13 ENCOUNTER — Ambulatory Visit: Payer: Self-pay | Attending: Internal Medicine | Admitting: Physician Assistant

## 2015-08-13 ENCOUNTER — Encounter: Payer: Self-pay | Admitting: Physician Assistant

## 2015-08-13 DIAGNOSIS — E1142 Type 2 diabetes mellitus with diabetic polyneuropathy: Secondary | ICD-10-CM

## 2015-08-13 LAB — POCT GLYCOSYLATED HEMOGLOBIN (HGB A1C): Hemoglobin A1C: 6.2

## 2015-08-13 LAB — GLUCOSE, POCT (MANUAL RESULT ENTRY): POC Glucose: 228 mg/dl — AB (ref 70–99)

## 2015-08-13 MED ORDER — TRAMADOL HCL 50 MG PO TABS: 100.0000 mg | ORAL_TABLET | Freq: Four times a day (QID) | ORAL | 0 refills | Status: DC | PRN

## 2015-08-13 MED ORDER — FENOFIBRATE 145 MG PO TABS: 145.0000 mg | ORAL_TABLET | Freq: Every day | ORAL | 11 refills | Status: DC

## 2015-08-13 MED ORDER — PREGABALIN 100 MG PO CAPS: 75.0000 mg | ORAL_CAPSULE | Freq: Two times a day (BID) | ORAL | 3 refills | Status: DC

## 2015-08-13 MED FILL — METFORMIN HCL ER 500 MG TAB: 500 | 30 days supply | Qty: 60 | Fill #0

## 2015-08-13 MED FILL — ?FENOFIBRATE 145 MG TABLET: 145 | 30 days supply | Qty: 30 | Fill #0

## 2015-08-13 MED FILL — $LANTUS 100 UNITS/ML VIAL: 100 | 57 days supply | Qty: 20 | Fill #0

## 2015-08-13 NOTE — Progress Notes (Signed)
Chief Complaint: Hand and feet tingling  Subjective: This is a 55 year old male with diabetes mellitus type 2, by neuropathy. He also has bipolar disorder, hypertension, dyslipidemia and obstructive sleep apnea. He comes in today with multiple complaints. 1. Worsening hand and feet tingling. Lyrica helps. Question whether there is a higher dose that he could take. Tramadol helps. Question whether there is a higher dose that he can take. 2. Dental pain. Had multiple teeth pulled recently. Did go back to the dentist in August. Wanting something for his worsening pain. Was just seen in urgent care on 08/06/2015 for the pain. He was given Norco, 15 tablets. 3.  He was told that his cholesterol was abnormal prior. He specifically states his triglycerides were high and that something would be called to in for him, he states he never received the prescription. 4. DM-I saw Clarene Critchley note he was to start metformin 500 mg twice daily back since his kidney function had normalized. He states that he was not aware of this.CBG 228 today, A1c 6.6%.   ROS:  GEN: denies fever or chills, denies change in weight Skin: denies lesions or rashes HEENT: denies headache, earache, epistaxis, sore throat, or neck pain LUNGS: denies SHOB, dyspnea, PND, orthopnea CV: denies CP or palpitations ABD: denies abd pain, N or V EXT: denies muscle spasms or swelling; no pain in lower ext, no weakness NEURO: + numbness or tingling, denies sz, stroke or TIA   Objective:  Vitals:   08/13/15 0930  Weight: 226 lb (102.5 kg)  Height: '6\' 2"'$  (1.88 m)    Physical Exam: General: in no acute distress. Heart: Normal  s1 &s2  Regular rate and rhythm, without murmurs, rubs, gallops. Lungs: Clear to auscultation bilaterally. Abdomen: Soft, nontender, nondistended, positive bowel sounds. Extremities: No clubbing cyanosis or edema with positive pedal pulses. Neuro: Alert, awake, oriented x3, nonfocal.  Pertinent Lab Results:CBG 228;  A1C 6.6%   Medications: Prior to Admission medications   Medication Sig Start Date End Date Taking? Authorizing Provider  amLODipine (NORVASC) 10 MG tablet Take 1 tablet (10 mg total) by mouth daily. 06/01/15  Yes Josalyn Funches, MD  Blood Glucose Monitoring Suppl (TRUE METRIX METER) w/Device KIT Use as directed 04/17/15  Yes Olugbemiga E Doreene Burke, MD  glucose blood (TRUE METRIX BLOOD GLUCOSE TEST) test strip Use as instructed 04/17/15  Yes Olugbemiga E Doreene Burke, MD  Insulin Glargine (LANTUS SOLOSTAR) 100 UNIT/ML Solostar Pen Inject 35 Units into the skin daily. 06/01/15  Yes Josalyn Funches, MD  Insulin Pen Needle (B-D ULTRAFINE III SHORT PEN) 31G X 8 MM MISC Check blood sugar TID and QHS 03/21/15  Yes Lance Bosch, NP  pregabalin (LYRICA) 100 MG capsule Take 1 capsule (100 mg total) by mouth 2 (two) times daily. For PASS 08/13/15  Yes Tiffany Daneil Dan, PA-C  traMADol (ULTRAM) 50 MG tablet Take 2 tablets (100 mg total) by mouth every 6 (six) hours as needed for severe pain. 08/13/15  Yes Brayton Caves, PA-C  TRUEPLUS LANCETS 28G MISC Use as needed for up to 4 times daily testing of blood sugar 04/17/15  Yes Tresa Garter, MD  fenofibrate (TRICOR) 145 MG tablet Take 1 tablet (145 mg total) by mouth daily. 08/13/15   Tiffany Daneil Dan, PA-C  HYDROcodone-acetaminophen (NORCO) 5-325 MG tablet Take 1 tablet by mouth every 6 (six) hours as needed for moderate pain. Patient not taking: Reported on 08/13/2015 08/06/15   Melony Overly, MD  ketoconazole (NIZORAL) 2 % cream Apply  1 application topically 2 (two) times daily. Patient not taking: Reported on 08/13/2015 06/01/15   Boykin Nearing, MD  metFORMIN (GLUCOPHAGE XR) 500 MG 24 hr tablet Take 2 tablets (1,000 mg total) by mouth daily after supper. Patient not taking: Reported on 08/13/2015 06/03/15   Boykin Nearing, MD  OLANZapine (ZYPREXA) 5 MG tablet Take 1 tablet (5 mg total) by mouth at bedtime. Patient not taking: Reported on 08/13/2015 03/22/15   Lance Bosch,  NP    Assessment: 1. DM2 - bettter control 2. Diabetic Neuropathy 3. Dyslipidemia 4. Dental pain/caries/poor dentition  Plan: -I did not give the Metformin as A1C has improved and so has his compliance -Cont Lantus as prescribed and keep a log -Increase Lyrica -Increase Tramadol -pain clinic referral -add Tricor -Keep appt with dentist next month  Follow up:4 weeks  The patient was given clear instructions to go to ER or return to medical center if symptoms don't improve, worsen or new problems develop. The patient verbalized understanding. The patient was told to call to get lab results if they haven't heard anything in the next week.   This note has been created with Surveyor, quantity. Any transcriptional errors are unintentional.   Zettie Pho, PA-C 08/13/2015, 10:08 AM

## 2015-08-13 NOTE — Progress Notes (Signed)
Pt here for diabetic neuropathy. Pt states that his feet and hands sting. Pt reports pain in hands and feet rated at an 8.  Pt has taken medications today and has eaten. CBG is 228. A1C is 6.2

## 2015-08-15 ENCOUNTER — Ambulatory Visit (INDEPENDENT_AMBULATORY_CARE_PROVIDER_SITE_OTHER): Payer: No Typology Code available for payment source | Admitting: Podiatry

## 2015-08-15 ENCOUNTER — Encounter: Payer: Self-pay | Admitting: Podiatry

## 2015-08-15 VITALS — BP 143/81 | HR 64 | Resp 14

## 2015-08-15 DIAGNOSIS — Q828 Other specified congenital malformations of skin: Secondary | ICD-10-CM

## 2015-08-15 NOTE — Progress Notes (Addendum)
Subjective:     Patient ID: ORRIE RAMPLEY, male   DOB: 24-Apr-1960, 55 y.o.   MRN: [id number]  HPI's patient presents the office for follow-up for diabetic ulcer on his right big toe. He was seen 3  weeks ago and the callus over the ulcer to block was debrided. He was given home instructions for soaks and bandaging. He presents the office today wearing no bandage on his big toe. He states that he developed a significant callus over the last 2 weeks, not having any pain or discomfort. He does have diabetes with neuropathy says that there is still drainage and bleeding from the ulcer on the big toe, right foot   Review of Systems     Objective:   Physical Exam GENERAL APPEARANCE: Alert, conversant. Appropriately groomed. No acute distress.  VASCULAR: Pedal pulses are  palpable at  [facility] and PT bilateral.  Capillary refill time is immediate to all digits,  Normal temperature gradient.  Digital hair growth is present bilateral  NEUROLOGIC: sensation is diminished  to 5.07 monofilament at 5/5 sites bilateral.  Light touch is intact bilateral, Muscle strength normal.  MUSCULOSKELETAL: acceptable muscle strength, tone and stability bilateral.  Intrinsic muscluature intact bilateral.  Rectus appearance of foot and digits noted bilateral.   DERMATOLOGIC: Excessive callus formation over 5  mm. X 10 mm.  Ulcer.  No drainage or pus or malodor.      Assessment:     Diabetic ulcer right hallux     Plan:     Debridement of hallux right hallux.  Neosporin/DSD  Dispersion padding right hallux.  RTC 2  weeks.   Gardiner Barefoot DPM

## 2015-08-28 ENCOUNTER — Telehealth: Payer: Self-pay | Admitting: Family Medicine

## 2015-08-28 NOTE — Telephone Encounter (Signed)
He saw Tiffany two weeks ago and was prescribed 120 tramadol, I know it was written for 2 q 6 but prn with the goal of the 120 lasting one month. He will need to wait for 09/13/15 for tramadol refill Does he have any tramadol left?

## 2015-08-28 NOTE — Telephone Encounter (Signed)
Pt came into the office to speak to nurse regarding his medication refill for traMADol (ULTRAM) 50 MG tablet. He also wanted to schedule an appt (if needed for meds) but I was unable to schedule for this or next week. Please follow up. [phone])  Thank you

## 2015-08-30 ENCOUNTER — Ambulatory Visit: Payer: No Typology Code available for payment source | Admitting: Podiatry

## 2015-08-30 NOTE — Telephone Encounter (Signed)
Cll pt - advsd of PCP's notation. Scheduled f/u appt for 09/14/15 at 4:15 pm w/PCP. Pt stated he understood.

## 2015-09-02 ENCOUNTER — Ambulatory Visit (HOSPITAL_COMMUNITY)
Admission: EM | Admit: 2015-09-02 | Discharge: 2015-09-02 | Disposition: A | Discharge status: Home / Self Care | Payer: Self-pay | Attending: Internal Medicine | Admitting: Internal Medicine

## 2015-09-02 ENCOUNTER — Encounter (HOSPITAL_COMMUNITY): Payer: Self-pay | Admitting: Nurse Practitioner

## 2015-09-02 DIAGNOSIS — K029 Dental caries, unspecified: Secondary | ICD-10-CM

## 2015-09-02 MED ORDER — HYDROCODONE-ACETAMINOPHEN 7.5-325 MG PO TABS: 1.0000 | ORAL_TABLET | Freq: Four times a day (QID) | ORAL | 0 refills | Status: DC | PRN

## 2015-09-02 NOTE — ED Provider Notes (Signed)
CSN: [id number]     Arrival date & time 09/02/15  1513 History   None    Chief Complaint  Patient presents with  . Dental Pain  . Extremity Pain   (Consider location/radiation/quality/duration/timing/severity/associated sxs/prior Treatment) HPI 55 year old male with significant dental caries. He recently underwent surgical extraction of 4 teeth in the left upper gumline. There is further work to be done in his mouth for extractions that is due to the first part of the month. Patient states he is not able to control his pain with Tylenol and ibuprofen and is requesting something stronger. Pain score is 7. Past Medical History:  Diagnosis Date  . Depressed bipolar disorder ([place])   . Diabetes mellitus without complication ([place])   . Diabetic neuropathy ([place])   . Hypertension   . Sleep apnea    Past Surgical History:  Procedure Laterality Date  . SHOULDER ARTHROSCOPY WITH ROTATOR CUFF REPAIR Right 2009  . SPINE SURGERY  2010   Family History  Problem Relation Age of Onset  . Diabetes Mother   . Hyperlipidemia Mother   . Heart disease Father    Social History  Substance Use Topics  . Smoking status: Never Smoker  . Smokeless tobacco: Never Used  . Alcohol use No    Review of Systems  Denies: HEADACHE, NAUSEA, ABDOMINAL PAIN, CHEST PAIN, CONGESTION, DYSURIA, SHORTNESS OF BREATH  Allergies  Heparin; Oxytetracycline; and Sulfonamide derivatives  Home Medications   Prior to Admission medications   Medication Sig Start Date End Date Taking? Authorizing Provider  amLODipine (NORVASC) 10 MG tablet Take 1 tablet (10 mg total) by mouth daily. 06/01/15   Boykin Nearing, MD  atorvastatin (LIPITOR) 40 MG tablet  08/03/15   Historical Provider, MD  Blood Glucose Monitoring Suppl (TRUE METRIX METER) w/Device KIT Use as directed 04/17/15   Tresa Garter, MD  fenofibrate (TRICOR) 145 MG tablet Take 1 tablet (145 mg total) by mouth daily. 08/13/15   Tiffany Daneil Dan, PA-C  glucose blood  (TRUE METRIX BLOOD GLUCOSE TEST) test strip Use as instructed 04/17/15   Tresa Garter, MD  HYDROcodone-acetaminophen (NORCO) 5-325 MG tablet Take 1 tablet by mouth every 6 (six) hours as needed for moderate pain. 08/06/15   Melony Overly, MD  HYDROcodone-acetaminophen (NORCO) 7.5-325 MG tablet Take 1 tablet by mouth every 6 (six) hours as needed for moderate pain. 09/02/15   Konrad Felix, PA  Insulin Glargine (LANTUS SOLOSTAR) 100 UNIT/ML Solostar Pen Inject 35 Units into the skin daily. 06/01/15   Josalyn Funches, MD  Insulin Pen Needle (B-D ULTRAFINE III SHORT PEN) 31G X 8 MM MISC Check blood sugar TID and QHS 03/21/15   Lance Bosch, NP  ketoconazole (NIZORAL) 2 % cream Apply 1 application topically 2 (two) times daily. 06/01/15   Josalyn Funches, MD  metFORMIN (GLUCOPHAGE XR) 500 MG 24 hr tablet Take 2 tablets (1,000 mg total) by mouth daily after supper. 06/03/15   Josalyn Funches, MD  OLANZapine (ZYPREXA) 5 MG tablet Take 1 tablet (5 mg total) by mouth at bedtime. 03/22/15   Lance Bosch, NP  pregabalin (LYRICA) 100 MG capsule Take 1 capsule (100 mg total) by mouth 2 (two) times daily. For PASS 08/13/15   Brayton Caves, PA-C  traMADol (ULTRAM) 50 MG tablet Take 2 tablets (100 mg total) by mouth every 6 (six) hours as needed for severe pain. 08/13/15   Brayton Caves, PA-C  TRUEPLUS LANCETS 28G MISC Use as needed for up  to 4 times daily testing of blood sugar 04/17/15   Olugbemiga E Jegede, MD   Meds Ordered and Administered this Visit  Medications - No data to display  BP 168/94 Comment: reports he did take his BP medication today  Pulse 102   Temp 98.8 F (37.1 C) (Oral)   Resp 17   SpO2 100%  No data found.   Physical Exam NURSES NOTES AND VITAL SIGNS REVIEWED. CONSTITUTIONAL: Well developed, well nourished, no acute distress HEENT: normocephalic, atraumatic mouth examination reveals 4 extracted teeth in the left upper gum line. The remainder of the teeth in the mouth are of poor  repair significantly decayed. There is no active infection noted. EYES: Conjunctiva normal NECK:normal ROM, supple, no adenopathy PULMONARY:No respiratory distress, normal effort ABDOMINAL: Soft, ND, NT BS+, No CVAT MUSCULOSKELETAL: Normal ROM of all extremities,  SKIN: warm and dry without rash PSYCHIATRIC: Mood and affect, behavior are normal  Urgent Care Course   Clinical Course    Procedures (including critical care time)  Labs Review Labs Reviewed - No data to display  Imaging Review No results found.   Visual Acuity Review  Right Eye Distance:   Left Eye Distance:   Bilateral Distance:    Right Eye Near:   Left Eye Near:    Bilateral Near:        55-year-old male with significant dental caries requesting pain medicine. MDM   1. Pain due to dental caries     Patient is reassured that there are no issues that require transfer to higher level of care at this time or additional tests. Patient is advised to continue home symptomatic treatment. Patient is advised that if there are new or worsening symptoms to attend the emergency department, contact primary care provider, or return to UC. Instructions of care provided discharged home in stable condition.    THIS NOTE WAS GENERATED USING A VOICE RECOGNITION SOFTWARE PROGRAM. ALL REASONABLE EFFORTS  WERE MADE TO PROOFREAD THIS DOCUMENT FOR ACCURACY.  I have verbally reviewed the discharge instructions with the patient. A printed AVS was given to the patient.  All questions were answered prior to discharge.      Frank C Patrick, PA 09/02/15 2103  

## 2015-09-02 NOTE — ED Triage Notes (Signed)
Pt c/o chronic dental pain. He has been following with a dentist but reports he needs extractions which the dentist has not been able to schedule him for yet and pain persists. He also c/o neuropathy related pain in hands and feet. Describes as sharp electrical pains that keep him up at night. Has been on neurontin for this in past with no relief. He currently has Rx for lyrica which he can not afford. He has been taking tramadol for his pain at home with no relief.

## 2015-09-12 ENCOUNTER — Ambulatory Visit: Payer: No Typology Code available for payment source | Admitting: Podiatry

## 2015-09-14 ENCOUNTER — Ambulatory Visit: Payer: Self-pay | Attending: Family Medicine | Admitting: Family Medicine

## 2015-09-14 ENCOUNTER — Encounter: Payer: Self-pay | Admitting: Family Medicine

## 2015-09-14 VITALS — BP 123/80 | HR 75 | Temp 98.3°F | Wt 232.8 lb

## 2015-09-14 DIAGNOSIS — L89899 Pressure ulcer of other site, unspecified stage: Secondary | ICD-10-CM | POA: Insufficient documentation

## 2015-09-14 DIAGNOSIS — Z794 Long term (current) use of insulin: Secondary | ICD-10-CM | POA: Insufficient documentation

## 2015-09-14 DIAGNOSIS — Z23 Encounter for immunization: Secondary | ICD-10-CM | POA: Insufficient documentation

## 2015-09-14 DIAGNOSIS — E1142 Type 2 diabetes mellitus with diabetic polyneuropathy: Secondary | ICD-10-CM | POA: Insufficient documentation

## 2015-09-14 DIAGNOSIS — L8990 Pressure ulcer of unspecified site, unspecified stage: Secondary | ICD-10-CM

## 2015-09-14 LAB — GLUCOSE, POCT (MANUAL RESULT ENTRY): POC Glucose: 182 mg/dl — AB (ref 70–99)

## 2015-09-14 MED ORDER — "INSULIN SYRINGE-NEEDLE U-100 31G X 5/16"" 0.5 ML MISC": 1.0000 | Freq: Three times a day (TID) | 0 refills | Status: DC

## 2015-09-14 MED ORDER — GABAPENTIN 300 MG PO CAPS: 300.0000 mg | ORAL_CAPSULE | Freq: Three times a day (TID) | ORAL | 3 refills | Status: DC

## 2015-09-14 MED ORDER — TRAMADOL HCL 50 MG PO TABS: 100.0000 mg | ORAL_TABLET | Freq: Four times a day (QID) | ORAL | 0 refills | Status: DC | PRN

## 2015-09-14 MED FILL — ?AMLODIPINE BESYLATE 10 MG: 10 | 30 days supply | Qty: 30 | Fill #1

## 2015-09-14 MED FILL — ?FENOFIBRATE 145 MG TABLET: 145 | 30 days supply | Qty: 30 | Fill #1

## 2015-09-14 MED FILL — METFORMIN HCL ER 500 MG TAB: 500 | 30 days supply | Qty: 60 | Fill #1

## 2015-09-14 NOTE — Progress Notes (Signed)
Subjective:  Patient ID: [name] [name], male    DOB: 1960-07-14  Age: 55 y.o. MRN: [id number]  CC: Medication Refill   HPI [name] [name] presents for   1. CHRONIC DIABETES  Disease Monitoring  Blood Sugar Ranges: < 200  Polyuria: no   Visual problems: no   Medication Compliance: yes  Medication Side Effects  Hypoglycemia: no   Preventitive Health Care  Eye Exam: due   Foot Exam: done today    2. Diabetic foot wound R plantar : healed. No longer draining. No pain.   3. Diabetic neuropathy: improving. lyrica does not help as much as gabapentin did. Tramadol is helping. He would like to transition back to gabapentin.  Social History  Substance Use Topics  . Smoking status: Never Smoker  . Smokeless tobacco: Never Used  . Alcohol use No      Outpatient Medications Prior to Visit  Medication Sig Dispense Refill  . amLODipine (NORVASC) 10 MG tablet Take 1 tablet (10 mg total) by mouth daily. 90 tablet 3  . atorvastatin (LIPITOR) 40 MG tablet   3  . Blood Glucose Monitoring Suppl (TRUE METRIX METER) w/Device KIT Use as directed 1 kit 0  . fenofibrate (TRICOR) 145 MG tablet Take 1 tablet (145 mg total) by mouth daily. 30 tablet 11  . glucose blood (TRUE METRIX BLOOD GLUCOSE TEST) test strip Use as instructed 100 each 12  . HYDROcodone-acetaminophen (NORCO) 5-325 MG tablet Take 1 tablet by mouth every 6 (six) hours as needed for moderate pain. 15 tablet 0  . HYDROcodone-acetaminophen (NORCO) 7.5-325 MG tablet Take 1 tablet by mouth every 6 (six) hours as needed for moderate pain. 30 tablet 0  . Insulin Glargine (LANTUS SOLOSTAR) 100 UNIT/ML Solostar Pen Inject 35 Units into the skin daily. 5 pen 11  . Insulin Pen Needle (B-D ULTRAFINE III SHORT PEN) 31G X 8 MM MISC Check blood sugar TID and QHS 100 each 0  . ketoconazole (NIZORAL) 2 % cream Apply 1 application topically 2 (two) times daily. 60 g 3  . metFORMIN (GLUCOPHAGE XR) 500 MG 24 hr tablet Take 2 tablets  (1,000 mg total) by mouth daily after supper. 180 tablet 3  . OLANZapine (ZYPREXA) 5 MG tablet Take 1 tablet (5 mg total) by mouth at bedtime. 30 tablet 0  . pregabalin (LYRICA) 100 MG capsule Take 1 capsule (100 mg total) by mouth 2 (two) times daily. For PASS 60 capsule 3  . traMADol (ULTRAM) 50 MG tablet Take 2 tablets (100 mg total) by mouth every 6 (six) hours as needed for severe pain. 120 tablet 0  . TRUEPLUS LANCETS 28G MISC Use as needed for up to 4 times daily testing of blood sugar 100 each 12   No facility-administered medications prior to visit.     ROS Review of Systems  Constitutional: Negative for chills, fatigue, fever and unexpected weight change.  Eyes: Negative for visual disturbance.  Respiratory: Negative for cough and shortness of breath.   Cardiovascular: Negative for chest pain, palpitations and leg swelling.  Gastrointestinal: Negative for abdominal pain, blood in stool, constipation, diarrhea, nausea and vomiting.  Endocrine: Negative for polydipsia, polyphagia and polyuria.  Musculoskeletal: Positive for arthralgias, back pain and myalgias. Negative for gait problem and neck pain.  Skin: Positive for wound. Negative for rash.  Allergic/Immunologic: Negative for immunocompromised state.  Neurological: Positive for numbness. Speech difficulty: in fingers and toes   Hematological: Negative for adenopathy. Does not bruise/bleed easily.  Psychiatric/Behavioral:  Positive for dysphoric mood. Negative for sleep disturbance and suicidal ideas. The patient is not nervous/anxious.     Objective:  BP 123/80 (BP Location: Right Arm)   Pulse 75   Temp 98.3 F (36.8 C) (Oral)   Wt 232 lb 12.8 oz (105.6 kg)   SpO2 97%   BMI 29.89 kg/m   BP/Weight 09/14/2015 5/66/7177 09/17/6460  Systolic BP 900 944 615  Diastolic BP 87 94 81  Wt. (Lbs) 232.8 - -  BMI 29.89 - -    Physical Exam  Constitutional: He appears well-developed and well-nourished. No distress.  HENT:    Head: Normocephalic and atraumatic.  Neck: Normal range of motion. Neck supple.  Cardiovascular: Normal rate, regular rhythm, normal heart sounds and intact distal pulses.   Pulmonary/Chest: Effort normal and breath sounds normal.  Musculoskeletal: He exhibits no edema.       Feet:  Neurological: He is alert.  Skin: Skin is warm and dry. No rash noted. No erythema.     Psychiatric: He has a normal mood and affect.    Lab Results  Component Value Date   HGBA1C 6.2 08/13/2015   CBG 182  Assessment & Plan:  [name] [name] was seen today for medication refill.  Diagnoses and all orders for this visit:  Type 2 diabetes mellitus with diabetic polyneuropathy, with long-term current use of insulin (HCC) -     POCT glucose (manual entry) -     Insulin Syringe-Needle U-100 (B-D INS SYRINGE 0.5CC/31GX5/16) 31G X 5/16" 0.5 ML MISC; 1 each by Does not apply route 3 (three) times daily.  Diabetic polyneuropathy associated with type 2 diabetes mellitus (HCC) -     traMADol (ULTRAM) 50 MG tablet; Take 2 tablets (100 mg total) by mouth every 6 (six) hours as needed for severe pain. -     gabapentin ([name]) 300 MG capsule; Take 1 capsule (300 mg total) by mouth 3 (three) times daily.  Encounter for immunization -     Flu Vaccine QUAD 36+ mos IM   No orders of the defined types were placed in this encounter.   Follow-up: Return in about 3 months (around 12/14/2015) for diabetes and HTN .   Boykin Nearing MD

## 2015-09-14 NOTE — Patient Instructions (Addendum)
[name] [name] was seen today for medication refill.  Diagnoses and all orders for this visit:  Type 2 diabetes mellitus with diabetic polyneuropathy, with long-term current use of insulin (HCC) -     POCT glucose (manual entry) -     Insulin Syringe-Needle U-100 (B-D INS SYRINGE 0.5CC/31GX5/16) 31G X 5/16" 0.5 ML MISC; 1 each by Does not apply route 3 (three) times daily.  Diabetic polyneuropathy associated with type 2 diabetes mellitus (HCC) -     traMADol (ULTRAM) 50 MG tablet; Take 2 tablets (100 mg total) by mouth every 6 (six) hours as needed for severe pain. -     gabapentin ([name]) 300 MG capsule; Take 1 capsule (300 mg total) by mouth 3 (three) times daily.

## 2015-09-18 MED FILL — TRUEPLUS SYR 1ML 31GX5/16": 31G X 5/16" | 30 days supply | Qty: 100 | Fill #0

## 2015-09-18 MED FILL — TRUEPLUS SYR 1ML 31GX5/16: 31G X 5/16" | 30 days supply | Qty: 100 | Fill #0

## 2015-09-18 MED FILL — GABAPENTIN 300 MG CAPSULE: 300 | 30 days supply | Qty: 90 | Fill #0

## 2015-09-21 NOTE — Assessment & Plan Note (Signed)
Controlled blood sugars Improving neuropathy Transition back to gabapentin, stop lyrica

## 2015-09-21 NOTE — Assessment & Plan Note (Signed)
Healed R foot ulcer

## 2015-09-23 ENCOUNTER — Encounter (HOSPITAL_COMMUNITY): Payer: Self-pay | Admitting: Emergency Medicine

## 2015-09-23 ENCOUNTER — Emergency Department (HOSPITAL_COMMUNITY)
Admission: EM | Admit: 2015-09-23 | Discharge: 2015-09-23 | Disposition: A | Discharge status: Home / Self Care | Payer: Self-pay | Attending: Emergency Medicine | Admitting: Emergency Medicine

## 2015-09-23 DIAGNOSIS — Z794 Long term (current) use of insulin: Secondary | ICD-10-CM | POA: Insufficient documentation

## 2015-09-23 DIAGNOSIS — I10 Essential (primary) hypertension: Secondary | ICD-10-CM | POA: Insufficient documentation

## 2015-09-23 DIAGNOSIS — K0889 Other specified disorders of teeth and supporting structures: Secondary | ICD-10-CM

## 2015-09-23 DIAGNOSIS — K029 Dental caries, unspecified: Secondary | ICD-10-CM | POA: Insufficient documentation

## 2015-09-23 DIAGNOSIS — E1140 Type 2 diabetes mellitus with diabetic neuropathy, unspecified: Secondary | ICD-10-CM | POA: Insufficient documentation

## 2015-09-23 DIAGNOSIS — Z7984 Long term (current) use of oral hypoglycemic drugs: Secondary | ICD-10-CM | POA: Insufficient documentation

## 2015-09-23 MED ORDER — HYDROCODONE-ACETAMINOPHEN 5-325 MG PO TABS
2.0000 | ORAL_TABLET | Freq: Once | ORAL | Status: AC
  Administered 2015-09-23: 2 via ORAL
  Filled 2015-09-23: qty 2

## 2015-09-23 NOTE — ED Provider Notes (Signed)
[facility] DEPT Provider Note   CSN: [id number] Arrival date & time: 09/23/15  0636     History   Chief Complaint Chief Complaint  Patient presents with  . Dental Pain    HPI [name] [name] is a 55 y.o. male.  Patient has history of recurrent dental pain. He has significant dental caries. He's progressively had his teeth pulled by an oral surgeon. Most recently he had 4 visits back teeth pulled about a week ago. He states he's having ongoing pain, primarily to his front teeth. There is no drainage. No fevers. No nausea or vomiting. He states his dentist only gave him 16 Vicodin tablets after the surgery and he feels he needs more.    Dental Pain      Past Medical History:  Diagnosis Date  . Depressed bipolar disorder ([place])   . Diabetes mellitus without complication ([place])   . Diabetic neuropathy ([place])   . Hypertension   . Sleep apnea     Patient Active Problem List   Diagnosis Date Noted  . Tinea pedis of both feet 06/03/2015  . Diabetic polyneuropathy associated with type 2 diabetes mellitus ([place]) 06/03/2015  . Scalp wound 06/03/2015  . Pre-ulcerative calluses 06/01/2015  . Pressure ulcer 02/22/2015  . Bipolar I disorder, most recent episode depressed ([place]) 02/21/2015  . Type 2 diabetes mellitus with diabetic polyneuropathy ([place]) 06/09/2014  . Depressed bipolar disorder ([place]) 01/01/2014  . HERPES ZOSTER 12/05/2008  . Hyperlipidemia 05/11/2008  . DEPRESSION 05/11/2008  . Essential hypertension 05/11/2008  . GERD 05/11/2008  . OSTEOARTHRITIS 05/11/2008  . LOW BACK PAIN 05/11/2008  . COLONIC POLYPS, HX OF 05/11/2008    Past Surgical History:  Procedure Laterality Date  . SHOULDER ARTHROSCOPY WITH ROTATOR CUFF REPAIR Right 2009  . SPINE SURGERY  2010       Home Medications    Prior to Admission medications   Medication Sig Start Date End Date Taking? Authorizing Provider  amLODipine (NORVASC) 10 MG tablet Take 1 tablet (10 mg total) by mouth  daily. 06/01/15   Boykin Nearing, MD  atorvastatin (LIPITOR) 40 MG tablet  08/03/15   Historical Provider, MD  Blood Glucose Monitoring Suppl (TRUE METRIX METER) w/Device KIT Use as directed 04/17/15   Tresa Garter, MD  fenofibrate (TRICOR) 145 MG tablet Take 1 tablet (145 mg total) by mouth daily. 08/13/15   Brayton Caves, PA-C  gabapentin ([name]) 300 MG capsule Take 1 capsule (300 mg total) by mouth 3 (three) times daily. 09/14/15   Josalyn Funches, MD  glucose blood (TRUE METRIX BLOOD GLUCOSE TEST) test strip Use as instructed 04/17/15   Tresa Garter, MD  Insulin Glargine (LANTUS SOLOSTAR) 100 UNIT/ML Solostar Pen Inject 35 Units into the skin daily. 06/01/15   Josalyn Funches, MD  Insulin Pen Needle (B-D ULTRAFINE III SHORT PEN) 31G X 8 MM MISC Check blood sugar TID and QHS 03/21/15   Lance Bosch, NP  Insulin Syringe-Needle U-100 (B-D INS SYRINGE 0.5CC/31GX5/16) 31G X 5/16" 0.5 ML MISC 1 each by Does not apply route 3 (three) times daily. 09/14/15   Josalyn Funches, MD  ketoconazole (NIZORAL) 2 % cream Apply 1 application topically 2 (two) times daily. 06/01/15   Josalyn Funches, MD  metFORMIN (GLUCOPHAGE XR) 500 MG 24 hr tablet Take 2 tablets (1,000 mg total) by mouth daily after supper. 06/03/15   Josalyn Funches, MD  OLANZapine (ZYPREXA) 5 MG tablet Take 1 tablet (5 mg total) by mouth at bedtime. 03/22/15  Lance Bosch, NP  traMADol (ULTRAM) 50 MG tablet Take 2 tablets (100 mg total) by mouth every 6 (six) hours as needed for severe pain. 09/14/15   Boykin Nearing, MD  TRUEPLUS LANCETS 28G MISC Use as needed for up to 4 times daily testing of blood sugar 04/17/15   Tresa Garter, MD    Family History Family History  Problem Relation Age of Onset  . Diabetes Mother   . Hyperlipidemia Mother   . Heart disease Father     Social History Social History  Substance Use Topics  . Smoking status: Never Smoker  . Smokeless tobacco: Never Used  . Alcohol use No     Allergies     Heparin; Oxytetracycline; and Sulfonamide derivatives   Review of Systems Review of Systems  Constitutional: Negative for fever.  HENT: Positive for dental problem. Negative for facial swelling.   Gastrointestinal: Negative for nausea and vomiting.  Skin: Negative for rash and wound.  Neurological: Negative for headaches.     Physical Exam Updated Vital Signs BP 143/90   Pulse 88   Temp 98.4 F (36.9 C) (Oral)   Resp 16   SpO2 100%   Physical Exam  Constitutional: He appears well-developed and well-nourished.  HENT:  Patient with poor dentition throughout. There is some mild swelling of the gums but no evidence of significant gingivitis or dental abscesses. No drainage from the teeth. No trismus. Uvula is midline.  The prior surgical sites appear to be healing well.     ED Treatments / Results  Labs (all labs ordered are listed, but only abnormal results are displayed) Labs Reviewed - No data to display  EKG  EKG Interpretation None       Radiology No results found.  Procedures Procedures (including critical care time)  Medications Ordered in ED Medications  HYDROcodone-acetaminophen (NORCO/VICODIN) 5-325 MG per tablet 2 tablet (not administered)     Initial Impression / Assessment and Plan / ED Course  I have reviewed the triage vital signs and the nursing notes.  Pertinent labs & imaging results that were available during my care of the patient were reviewed by me and considered in my medical decision making (see chart for details).  Clinical Course   Patient dental caries and recurrent dental pain. I don't see any evidence of acute infections. He has been followed by an oral Psychologist, sport and exercise. He is also seen at the [facility]. On review of the [place] controlled substance database, he's had several prescriptions for both tramadol and hydrocodone filled over the last few months. I advised him that per our pain policy, we were unable  to give him a prescription for pain medication. He was given 1 dose of Vicodin the ED today.  Final Clinical Impressions(s) / ED Diagnoses   Final diagnoses:  Pain, dental  Dental caries    New Prescriptions New Prescriptions   No medications on file     Malvin Johns, MD 09/23/15 [phone]

## 2015-09-23 NOTE — ED Triage Notes (Addendum)
Patient with chronic dental pain, following with a dentist and getting extractions when he is able.  Patient had multiple extractions last week on Friday and has been in pain all night long.  He states that he was given Hydrocodone but he is now out of them.

## 2015-09-26 ENCOUNTER — Ambulatory Visit: Payer: No Typology Code available for payment source | Admitting: Podiatry

## 2015-10-03 ENCOUNTER — Encounter: Payer: Self-pay | Attending: Physical Medicine & Rehabilitation | Admitting: Physical Medicine & Rehabilitation

## 2015-10-03 ENCOUNTER — Encounter: Payer: Self-pay | Admitting: Physical Medicine & Rehabilitation

## 2015-10-03 VITALS — BP 155/84 | HR 64 | Resp 14

## 2015-10-03 DIAGNOSIS — R531 Weakness: Secondary | ICD-10-CM | POA: Insufficient documentation

## 2015-10-03 DIAGNOSIS — G8929 Other chronic pain: Secondary | ICD-10-CM | POA: Insufficient documentation

## 2015-10-03 DIAGNOSIS — Z5181 Encounter for therapeutic drug level monitoring: Secondary | ICD-10-CM

## 2015-10-03 DIAGNOSIS — R200 Anesthesia of skin: Secondary | ICD-10-CM | POA: Insufficient documentation

## 2015-10-03 DIAGNOSIS — G4730 Sleep apnea, unspecified: Secondary | ICD-10-CM | POA: Insufficient documentation

## 2015-10-03 DIAGNOSIS — E1142 Type 2 diabetes mellitus with diabetic polyneuropathy: Secondary | ICD-10-CM | POA: Insufficient documentation

## 2015-10-03 DIAGNOSIS — Z8249 Family history of ischemic heart disease and other diseases of the circulatory system: Secondary | ICD-10-CM | POA: Insufficient documentation

## 2015-10-03 DIAGNOSIS — M792 Neuralgia and neuritis, unspecified: Secondary | ICD-10-CM

## 2015-10-03 DIAGNOSIS — Z833 Family history of diabetes mellitus: Secondary | ICD-10-CM | POA: Insufficient documentation

## 2015-10-03 DIAGNOSIS — Z794 Long term (current) use of insulin: Secondary | ICD-10-CM

## 2015-10-03 DIAGNOSIS — L84 Corns and callosities: Secondary | ICD-10-CM | POA: Insufficient documentation

## 2015-10-03 DIAGNOSIS — F319 Bipolar disorder, unspecified: Secondary | ICD-10-CM | POA: Insufficient documentation

## 2015-10-03 DIAGNOSIS — Z79899 Other long term (current) drug therapy: Secondary | ICD-10-CM

## 2015-10-03 DIAGNOSIS — I10 Essential (primary) hypertension: Secondary | ICD-10-CM | POA: Insufficient documentation

## 2015-10-03 MED ORDER — NORTRIPTYLINE HCL 10 MG PO CAPS: 10.0000 mg | ORAL_CAPSULE | Freq: Every day | ORAL | 3 refills | Status: DC

## 2015-10-03 MED FILL — NORTRIPTYLINE HCL 10 MG CAP: 10 | 30 days supply | Qty: 60 | Fill #0

## 2015-10-03 NOTE — Addendum Note (Signed)
Addended by: Geryl Rankins D on: 10/03/2015 03:54 PM   Modules accepted: Orders

## 2015-10-03 NOTE — Addendum Note (Signed)
Addended by: Caro Hight on: 10/03/2015 02:31 PM   Modules accepted: Orders

## 2015-10-03 NOTE — Patient Instructions (Addendum)
PLEASE CALL ME WITH ANY PROBLEMS OR QUESTIONS [phone])   Cymbalta or Effexor----would these be ok to use for your pain from a standpoint of your bipolar disorder/psychiatrist???   Try the nortriptyline at the 10mg  dose for a week. If ineffective, increase to 20mg  at bedtoime

## 2015-10-03 NOTE — Progress Notes (Signed)
Subjective:    Patient ID: [name] [name], male    DOB: Apr 03, 1960, 55 y.o.   MRN: [id number]  HPI  This is an initial visit for [name] [name] who is here today with complaints of pain in his feet and hands due to diabetic peripheral neuropathy. He has been on gabapentin and lyrica in the past. Gabapentin helps somewhat and he remains on 300mg  TID as higher doses caused dizziness. Marland Kitchen His case is complicated by chronic foot wounds and calluses. He is followed by [facility].   Apparently he has taken tramadol which does provide him some relief. He has a history with opiate abuse in the past and ultimately went to a detox clinic where he completed treatment this past January. He went to [facility] previously  Pain Inventory Average Pain 7 Pain Right Now 9 My pain is burning, stabbing and tingling  In the last 24 hours, has pain interfered with the following? General activity 10 Relation with others 10 Enjoyment of life 10 What TIME of day is your pain at its worst? all Sleep (in general) Poor  Pain is worse with: walking, bending and standing Pain improves with: medication Relief from Meds: 7  Mobility walk without assistance how many minutes can you walk? 15-30 do you drive?  yes  Function not employed: date last employed 2013 I need assistance with the following:  bathing  Neuro/Psych weakness numbness tingling depression anxiety  Prior Studies Any changes since last visit?  no  Physicians involved in your care Any changes since last visit?  no   Family History  Problem Relation Age of Onset  . Diabetes Mother   . Hyperlipidemia Mother   . Heart disease Father    Social History   Social History  . Marital status: Single    Spouse name: N/A  . Number of children: N/A  . Years of education: N/A   Social History Main Topics  . Smoking status: Never Smoker  . Smokeless tobacco: Never Used  . Alcohol use No  . Drug use: No  .  Sexual activity: Not on file   Other Topics Concern  . Not on file   Social History Narrative  . No narrative on file   Past Surgical History:  Procedure Laterality Date  . SHOULDER ARTHROSCOPY WITH ROTATOR CUFF REPAIR Right 2009  . SPINE SURGERY  2010   Past Medical History:  Diagnosis Date  . Depressed bipolar disorder ([place])   . Diabetes mellitus without complication ([place])   . Diabetic neuropathy ([place])   . Hypertension   . Sleep apnea    BP (!) 155/84   Pulse 64   Resp 14   SpO2 98%   Opioid Risk Score:   Fall Risk Score:  `1  Depression screen PHQ 2/9  Depression screen [facility] 2/9 10/03/2015 09/14/2015 08/13/2015 06/01/2015 04/16/2015 03/26/2015 03/21/2015  Decreased Interest 1 1 0 0 0 0 0  Down, Depressed, Hopeless 1 1 0 1 0 0 0  PHQ - 2 Score 2 2 0 1 0 0 0  Altered sleeping 1 1 - 1 - 3 -  Tired, decreased energy 1 1 - 1 - 0 -  Change in appetite 0 0 - 0 - 0 -  Feeling bad or failure about yourself  1 0 - 1 - 0 -  Trouble concentrating 0 0 - 1 - 0 -  Moving slowly or fidgety/restless 0 0 - 0 - 0 -  Suicidal thoughts  0 0 - 0 - 0 -  PHQ-9 Score 5 4 - 5 - 3 -  Difficult doing work/chores Very difficult - - - - Somewhat difficult -    Review of Systems  All other systems reviewed and are negative.      Objective:   Physical Exam  General: Alert and oriented x 3, No apparent distress HEENT: Head is normocephalic, atraumatic, PERRLA, EOMI, sclera anicteric, oral mucosa pink and moist, dentition intact, ext ear canals clear,  Neck: Supple without JVD or lymphadenopathy Heart: Reg rate and rhythm. No murmurs rubs or gallops Chest: CTA bilaterally without wheezes, rales, or rhonchi; no distress Abdomen: Soft, non-tender, non-distended, bowel sounds positive. Extremities: No clubbing, cyanosis, or edema. Pulses are 2+ Skin: quarter sized callus at the left 1st MTP which is dry and almost ready to fall off. He has no other areas of breakdown. He has fungal disease in his toe  nails Neuro: Pt is cognitively appropriate with normal insight, memory, and awareness. Cranial nerves 2-12 are intact. Sensory exam is decreased to LT from mid palm to finger tips and from mid calf to toes. He really almost has no sense of pain/LT in the feet. Reflexes are 2+ in all 4's. Fine motor coordination is intact. No tremors. Motor function is grossly 5/5.  Musculoskeletal: Full ROM, No pain with AROM or PROM in the neck, trunk, or extremities. Posture appropriate Psych: Pt's affect is appropriate. Pt is cooperative         Assessment & Plan:  1. Diabetes II with painful peripheral neuropathy 2. Bipolar disorder followed by [facility] 3. Chronic foot wounds/calluses   Plan: 1. Will not increase gabapentin given his past intolerance at higher dosing 2. Introduce pamelor 10-20mg  qhs for neuropathic pain 3. Consider tramadol for nerve pain pending UDS 4. Discussed tight management of diabetes and bipolar symptoms.  5. Maintain skin care 6. Consider cymbalta or effexor as well  Thirty minutes of face to face patient care time were spent during this visit. All questions were encouraged and answered. Follow up in a month.

## 2015-10-11 LAB — TOXASSURE SELECT,+ANTIDEPR,UR

## 2015-10-15 NOTE — Progress Notes (Signed)
Urine drug screen for this encounter is consistent for prescribed medication 

## 2015-10-17 ENCOUNTER — Ambulatory Visit: Payer: No Typology Code available for payment source | Admitting: Podiatry

## 2015-11-08 MED FILL — ?AMLODIPINE BESYLATE 10 MG: 10 | 30 days supply | Qty: 30 | Fill #2

## 2015-11-08 MED FILL — FENOFIBRATE 145 MG TABLET: 145 | 30 days supply | Qty: 30 | Fill #2

## 2015-11-08 MED FILL — NORTRIPTYLINE HCL 10 MG CAP: 10 | 30 days supply | Qty: 60 | Fill #1

## 2015-11-08 MED FILL — METFORMIN HCL ER 500 MG TAB: 500 | 30 days supply | Qty: 60 | Fill #2

## 2015-11-08 MED FILL — GABAPENTIN 300 MG CAPSULE: 300 | 30 days supply | Qty: 90 | Fill #1

## 2015-11-12 ENCOUNTER — Encounter: Payer: Self-pay | Attending: Physical Medicine & Rehabilitation | Admitting: Physical Medicine & Rehabilitation

## 2015-11-12 DIAGNOSIS — F319 Bipolar disorder, unspecified: Secondary | ICD-10-CM | POA: Insufficient documentation

## 2015-11-12 DIAGNOSIS — Z8249 Family history of ischemic heart disease and other diseases of the circulatory system: Secondary | ICD-10-CM | POA: Insufficient documentation

## 2015-11-12 DIAGNOSIS — Z833 Family history of diabetes mellitus: Secondary | ICD-10-CM | POA: Insufficient documentation

## 2015-11-12 DIAGNOSIS — I10 Essential (primary) hypertension: Secondary | ICD-10-CM | POA: Insufficient documentation

## 2015-11-12 DIAGNOSIS — L84 Corns and callosities: Secondary | ICD-10-CM | POA: Insufficient documentation

## 2015-11-12 DIAGNOSIS — R531 Weakness: Secondary | ICD-10-CM | POA: Insufficient documentation

## 2015-11-12 DIAGNOSIS — G8929 Other chronic pain: Secondary | ICD-10-CM | POA: Insufficient documentation

## 2015-11-12 DIAGNOSIS — G4730 Sleep apnea, unspecified: Secondary | ICD-10-CM | POA: Insufficient documentation

## 2015-11-12 DIAGNOSIS — R200 Anesthesia of skin: Secondary | ICD-10-CM | POA: Insufficient documentation

## 2015-11-12 DIAGNOSIS — E1142 Type 2 diabetes mellitus with diabetic polyneuropathy: Secondary | ICD-10-CM | POA: Insufficient documentation

## 2015-11-14 HISTORY — PX: EXTRACORPOREAL CIRCULATION: SHX266

## 2015-11-28 ENCOUNTER — Ambulatory Visit: Payer: No Typology Code available for payment source | Admitting: Podiatry

## 2015-12-05 ENCOUNTER — Encounter: Payer: Self-pay | Admitting: Physical Medicine & Rehabilitation

## 2015-12-17 MED FILL — NORTRIPTYLINE HCL 10 MG CAP: 10 | 30 days supply | Qty: 60 | Fill #2

## 2015-12-17 MED FILL — METFORMIN HCL ER 500 MG TAB: 500 | 30 days supply | Qty: 60 | Fill #3

## 2015-12-17 MED FILL — FENOFIBRATE 145 MG TABLET: 145 | 30 days supply | Qty: 30 | Fill #3

## 2015-12-17 MED FILL — AMLODIPINE BESYLATE 10 MG T: 10 | 30 days supply | Qty: 30 | Fill #3

## 2015-12-17 MED FILL — GABAPENTIN 300 MG CAPSULE: 300 | 30 days supply | Qty: 90 | Fill #2

## 2015-12-24 ENCOUNTER — Ambulatory Visit: Payer: Self-pay | Attending: Family Medicine

## 2016-01-21 ENCOUNTER — Encounter: Payer: Self-pay | Attending: Physical Medicine & Rehabilitation | Admitting: Physical Medicine & Rehabilitation

## 2016-01-21 DIAGNOSIS — F319 Bipolar disorder, unspecified: Secondary | ICD-10-CM | POA: Insufficient documentation

## 2016-01-21 DIAGNOSIS — E1142 Type 2 diabetes mellitus with diabetic polyneuropathy: Secondary | ICD-10-CM | POA: Insufficient documentation

## 2016-01-21 DIAGNOSIS — L84 Corns and callosities: Secondary | ICD-10-CM | POA: Insufficient documentation

## 2016-01-21 DIAGNOSIS — I10 Essential (primary) hypertension: Secondary | ICD-10-CM | POA: Insufficient documentation

## 2016-01-21 DIAGNOSIS — G8929 Other chronic pain: Secondary | ICD-10-CM | POA: Insufficient documentation

## 2016-01-21 DIAGNOSIS — Z833 Family history of diabetes mellitus: Secondary | ICD-10-CM | POA: Insufficient documentation

## 2016-01-21 DIAGNOSIS — R200 Anesthesia of skin: Secondary | ICD-10-CM | POA: Insufficient documentation

## 2016-01-21 DIAGNOSIS — R531 Weakness: Secondary | ICD-10-CM | POA: Insufficient documentation

## 2016-01-21 DIAGNOSIS — Z8249 Family history of ischemic heart disease and other diseases of the circulatory system: Secondary | ICD-10-CM | POA: Insufficient documentation

## 2016-01-21 DIAGNOSIS — G4730 Sleep apnea, unspecified: Secondary | ICD-10-CM | POA: Insufficient documentation

## 2016-02-01 MED FILL — NORTRIPTYLINE HCL 10 MG CAP: 10 | 30 days supply | Qty: 60 | Fill #3

## 2016-02-01 MED FILL — GABAPENTIN 300 MG CAPSULE: 300 | 30 days supply | Qty: 90 | Fill #3

## 2016-02-05 ENCOUNTER — Ambulatory Visit: Payer: Self-pay | Attending: Family Medicine | Admitting: Family Medicine

## 2016-02-05 ENCOUNTER — Encounter (INDEPENDENT_AMBULATORY_CARE_PROVIDER_SITE_OTHER): Payer: Self-pay

## 2016-02-05 ENCOUNTER — Encounter: Payer: Self-pay | Admitting: Family Medicine

## 2016-02-05 VITALS — BP 151/89 | HR 66 | Temp 97.4°F | Wt 241.4 lb

## 2016-02-05 DIAGNOSIS — Z79899 Other long term (current) drug therapy: Secondary | ICD-10-CM | POA: Insufficient documentation

## 2016-02-05 DIAGNOSIS — Z9889 Other specified postprocedural states: Secondary | ICD-10-CM | POA: Insufficient documentation

## 2016-02-05 DIAGNOSIS — E785 Hyperlipidemia, unspecified: Secondary | ICD-10-CM

## 2016-02-05 DIAGNOSIS — M542 Cervicalgia: Secondary | ICD-10-CM

## 2016-02-05 DIAGNOSIS — I10 Essential (primary) hypertension: Secondary | ICD-10-CM

## 2016-02-05 DIAGNOSIS — M545 Low back pain, unspecified: Secondary | ICD-10-CM

## 2016-02-05 DIAGNOSIS — M4722 Other spondylosis with radiculopathy, cervical region: Secondary | ICD-10-CM

## 2016-02-05 DIAGNOSIS — E1142 Type 2 diabetes mellitus with diabetic polyneuropathy: Secondary | ICD-10-CM

## 2016-02-05 DIAGNOSIS — G8929 Other chronic pain: Secondary | ICD-10-CM

## 2016-02-05 DIAGNOSIS — Z794 Long term (current) use of insulin: Secondary | ICD-10-CM

## 2016-02-05 LAB — GLUCOSE, POCT (MANUAL RESULT ENTRY): POC Glucose: 163 mg/dl — AB (ref 70–99)

## 2016-02-05 LAB — POCT GLYCOSYLATED HEMOGLOBIN (HGB A1C): Hemoglobin A1C: 7.2

## 2016-02-05 MED ORDER — GABAPENTIN 300 MG PO CAPS: 300.0000 mg | ORAL_CAPSULE | Freq: Three times a day (TID) | ORAL | 11 refills | Status: DC

## 2016-02-05 MED ORDER — AMLODIPINE BESYLATE 10 MG PO TABS: 10.0000 mg | ORAL_TABLET | Freq: Every day | ORAL | 11 refills | Status: DC

## 2016-02-05 MED ORDER — ATORVASTATIN CALCIUM 40 MG PO TABS: 40.0000 mg | ORAL_TABLET | Freq: Every day | ORAL | 11 refills | Status: DC

## 2016-02-05 MED ORDER — INSULIN GLARGINE 100 UNIT/ML SOLOSTAR PEN: 35.0000 [IU] | PEN_INJECTOR | Freq: Every day | SUBCUTANEOUS | 11 refills | Status: DC

## 2016-02-05 MED ORDER — TRAMADOL HCL 50 MG PO TABS: 100.0000 mg | ORAL_TABLET | Freq: Two times a day (BID) | ORAL | 0 refills | Status: DC | PRN

## 2016-02-05 MED ORDER — METFORMIN HCL ER 500 MG PO TB24: 1000.0000 mg | ORAL_TABLET | Freq: Every day | ORAL | 11 refills | Status: DC

## 2016-02-05 MED ORDER — FENOFIBRATE 145 MG PO TABS: 145.0000 mg | ORAL_TABLET | Freq: Every day | ORAL | 11 refills | Status: DC

## 2016-02-05 MED FILL — METFORMIN HCL ER 500 MG TAB: 500 | 30 days supply | Qty: 60 | Fill #0

## 2016-02-05 MED FILL — ?FENOFIBRATE 145 MG TABLET: 145 | 30 days supply | Qty: 30 | Fill #0

## 2016-02-05 MED FILL — ATORVASTATIN 40 MG TABLET: 40 | 30 days supply | Qty: 30 | Fill #0

## 2016-02-05 MED FILL — AMLODIPINE BESYLATE 10 MG T: 10 | 30 days supply | Qty: 30 | Fill #0

## 2016-02-05 NOTE — Assessment & Plan Note (Addendum)
Slight decline with rise in A1c Continue lantus and metformin Advised reduce sugar in diet Advised weight loss
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
## 2016-02-05 NOTE — Assessment & Plan Note (Signed)
No sores or ulcerations Continue gabapentin Restart tramadol

## 2016-02-05 NOTE — Patient Instructions (Addendum)
[name] [name] was seen today for diabetes.  Diagnoses and all orders for this visit:  Type 2 diabetes mellitus with diabetic polyneuropathy, with long-term current use of insulin (HCC) -     POCT glucose (manual entry) -     POCT glycosylated hemoglobin (Hb A1C) -     Microalbumin/Creatinine Ratio, Urine -     COMPLETE METABOLIC PANEL WITH GFR -     Insulin Glargine (LANTUS SOLOSTAR) 100 UNIT/ML Solostar Pen; Inject 35 Units into the skin daily. -     metFORMIN (GLUCOPHAGE XR) 500 MG 24 hr tablet; Take 2 tablets (1,000 mg total) by mouth daily after supper.  Diabetic polyneuropathy associated with type 2 diabetes mellitus (HCC) -     traMADol (ULTRAM) 50 MG tablet; Take 2 tablets (100 mg total) by mouth every 12 (twelve) hours as needed for severe pain. -     gabapentin ([name]) 300 MG capsule; Take 1 capsule (300 mg total) by mouth 3 (three) times daily.  Chronic bilateral low back pain without sciatica -     MR Lumbar Spine Wo Contrast; Future  Chronic neck pain -     MR Cervical Spine Wo Contrast; Future  Hyperlipidemia, unspecified hyperlipidemia type -     fenofibrate (TRICOR) 145 MG tablet; Take 1 tablet (145 mg total) by mouth daily. -     atorvastatin (LIPITOR) 40 MG tablet; Take 1 tablet (40 mg total) by mouth daily at 6 PM. -     CK   Your A1c is up a bit to 7.2, goal is < 7 Please work on reducing weight by reducing carbohydrates and increasing exercise as tolerated Continue lantus at 35  U and metformin at 1000 mg daily  F/u in 3 months for diabetes   Dr. Adrian Blackwater

## 2016-02-05 NOTE — Assessment & Plan Note (Addendum)
Elevated Continue norvasc 10 mg daily  Checking urine microalbumin Will likely add ace inhibitor

## 2016-02-06 LAB — CK: Total CK: 88 U/L (ref 7–232)

## 2016-02-06 LAB — COMPLETE METABOLIC PANEL WITH GFR
ALT: 20 U/L (ref 9–46)
AST: 21 U/L (ref 10–35)
Albumin: 4 g/dL (ref 3.6–5.1)
Alkaline Phosphatase: 50 U/L (ref 40–115)
BUN: 20 mg/dL (ref 7–25)
CO2: 19 mmol/L — ABNORMAL LOW (ref 20–31)
Calcium: 9.2 mg/dL (ref 8.6–10.3)
Chloride: 105 mmol/L (ref 98–110)
Creat: 1.17 mg/dL (ref 0.70–1.33)
GFR, Est African American: 81 mL/min (ref >=60)
GFR, Est Non African American: 70 mL/min (ref >=60)
Glucose, Bld: 158 mg/dL — ABNORMAL HIGH (ref 65–99)
Potassium: 4.1 mmol/L (ref 3.5–5.3)
Sodium: 140 mmol/L (ref 135–146)
Total Bilirubin: 0.4 mg/dL (ref 0.2–1.2)
Total Protein: 6.7 g/dL (ref 6.1–8.1)

## 2016-02-06 LAB — MICROALBUMIN / CREATININE URINE RATIO
Creatinine, Urine: 183 mg/dL (ref 20–370)
Microalb Creat Ratio: 7 mcg/mg creat (ref <30)
Microalb, Ur: 1.2 mg/dL

## 2016-02-07 ENCOUNTER — Encounter: Payer: Self-pay | Admitting: Podiatry

## 2016-02-07 ENCOUNTER — Ambulatory Visit (INDEPENDENT_AMBULATORY_CARE_PROVIDER_SITE_OTHER): Payer: Self-pay | Admitting: Podiatry

## 2016-02-07 VITALS — Resp 14 | Wt 241.0 lb

## 2016-02-07 DIAGNOSIS — M79675 Pain in left toe(s): Secondary | ICD-10-CM

## 2016-02-07 DIAGNOSIS — B351 Tinea unguium: Secondary | ICD-10-CM

## 2016-02-07 DIAGNOSIS — G629 Polyneuropathy, unspecified: Secondary | ICD-10-CM

## 2016-02-07 DIAGNOSIS — L84 Corns and callosities: Secondary | ICD-10-CM

## 2016-02-07 DIAGNOSIS — M79674 Pain in right toe(s): Secondary | ICD-10-CM

## 2016-02-07 NOTE — Progress Notes (Signed)
Complaint:  Visit Type: Patient returns to my office for continued preventative foot care services. Complaint: Patient states" my nails have grown long and thick and become painful to walk and wear shoes" Patient has been diagnosed with DM with no foot complications. The patient presents for preventative foot care services. No changes to ROS.  Patient has healed ulcer right hallux with callus right big yoe.  Podiatric Exam: Vascular: dorsalis pedis and posterior tibial pulses are palpable bilateral. Capillary return is immediate. Temperature gradient is WNL. Skin turgor WNL  Sensorium: Normal Semmes Weinstein monofilament test. Normal tactile sensation bilaterally. Nail Exam: Pt has thick disfigured discolored nails with subungual debris noted bilateral entire nail hallux through fifth toenails Ulcer Exam: There is no evidence of ulcer or pre-ulcerative changes or infection. Orthopedic Exam: Muscle tone and strength are WNL. No limitations in general ROM. No crepitus or effusions noted. Foot type and digits show no abnormalities. Bony prominences are unremarkable. Skin: No Porokeratosis. No infection or ulcers  Diagnosis:  Onychomycosis, , Pain in right toe, pain in left toes,  Callus right hallux  Treatment & Plan Procedures and Treatment: Consent by patient was obtained for treatment procedures. The patient understood the discussion of treatment and procedures well. All questions were answered thoroughly reviewed. Debridement of mycotic and hypertrophic toenails, 1 through 5 bilateral and clearing of subungual debris. No ulceration, no infection noted. Debridement of callus right., Return Visit-Office Procedure: Patient instructed to return to the office for a follow up visit 3 months for continued evaluation and treatment.    Gardiner Barefoot DPM

## 2016-02-08 ENCOUNTER — Telehealth: Payer: Self-pay

## 2016-02-08 NOTE — Telephone Encounter (Signed)
Pt was called and a message was left informing pt to return phone call for lab results. 

## 2016-02-08 NOTE — Telephone Encounter (Signed)
Pt returned phone call and was informed of lab results. 

## 2016-02-08 NOTE — Telephone Encounter (Signed)
Pt. Returned nurse call regarding results. Please f/u  °

## 2016-02-14 ENCOUNTER — Ambulatory Visit (HOSPITAL_COMMUNITY)
Admission: RE | Admit: 2016-02-14 | Discharge: 2016-02-14 | Disposition: A | Discharge status: Home / Self Care | Payer: Self-pay | Source: Ambulatory Visit | Attending: Family Medicine | Admitting: Family Medicine

## 2016-02-14 DIAGNOSIS — M48061 Spinal stenosis, lumbar region without neurogenic claudication: Secondary | ICD-10-CM | POA: Insufficient documentation

## 2016-02-14 DIAGNOSIS — M545 Low back pain, unspecified: Secondary | ICD-10-CM

## 2016-02-14 DIAGNOSIS — M5126 Other intervertebral disc displacement, lumbar region: Secondary | ICD-10-CM | POA: Insufficient documentation

## 2016-02-14 DIAGNOSIS — M4802 Spinal stenosis, cervical region: Secondary | ICD-10-CM | POA: Insufficient documentation

## 2016-02-14 DIAGNOSIS — Z9889 Other specified postprocedural states: Secondary | ICD-10-CM | POA: Insufficient documentation

## 2016-02-14 DIAGNOSIS — M542 Cervicalgia: Principal | ICD-10-CM

## 2016-02-14 DIAGNOSIS — G8929 Other chronic pain: Secondary | ICD-10-CM | POA: Insufficient documentation

## 2016-02-26 DIAGNOSIS — M47812 Spondylosis without myelopathy or radiculopathy, cervical region: Secondary | ICD-10-CM | POA: Insufficient documentation

## 2016-02-26 NOTE — Assessment & Plan Note (Signed)
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

## 2016-02-26 NOTE — Addendum Note (Signed)
Addended by: Boykin Nearing on: 02/26/2016 08:12 AM   Modules accepted: Orders

## 2016-02-26 NOTE — Assessment & Plan Note (Signed)
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 This would cause pain in the neck that could radiate down to the shoulders and arms  Nerve conduction test ordered for upper and lower extremities Neurosurgery referral placed

## 2016-02-27 ENCOUNTER — Telehealth: Payer: Self-pay | Admitting: Family Medicine

## 2016-02-27 ENCOUNTER — Telehealth: Payer: Self-pay

## 2016-02-27 NOTE — Telephone Encounter (Signed)
Patient returned nurse's call regarding results. Patient gave the verbal ok to leave a detailed message if he doesn't answer again. Please follow up.   Thank you.

## 2016-02-27 NOTE — Telephone Encounter (Signed)
Pt was called and informed of MRI results. Pt states that he would like a copy of his results.

## 2016-02-27 NOTE — Telephone Encounter (Signed)
Pt was called and a message was left for pt to return phone call for lab results. 

## 2016-03-06 ENCOUNTER — Ambulatory Visit: Payer: Self-pay | Admitting: Podiatry

## 2016-03-06 ENCOUNTER — Encounter: Payer: Self-pay | Admitting: Podiatry

## 2016-03-06 ENCOUNTER — Ambulatory Visit: Payer: No Typology Code available for payment source

## 2016-03-06 DIAGNOSIS — E11621 Type 2 diabetes mellitus with foot ulcer: Secondary | ICD-10-CM

## 2016-03-06 DIAGNOSIS — L97512 Non-pressure chronic ulcer of other part of right foot with fat layer exposed: Secondary | ICD-10-CM

## 2016-03-06 DIAGNOSIS — L97519 Non-pressure chronic ulcer of other part of right foot with unspecified severity: Secondary | ICD-10-CM

## 2016-03-07 NOTE — Progress Notes (Signed)
Subjective:     Patient ID: [name] [name], male   DOB: 03-27-1960, 56 y.o.   MRN: [id number]  HPI patient presents with a lesion on the plantar aspect of the big toe right that's thick   Review of Systems     Objective:   Physical Exam No change neurovascular with patient being diabetic    Assessment:     Keratotic lesion present right    Plan:     Debrided with no breakdown of tissue applied padding and continue routine care

## 2016-03-13 ENCOUNTER — Telehealth: Payer: Self-pay | Admitting: Family Medicine

## 2016-03-13 DIAGNOSIS — E1142 Type 2 diabetes mellitus with diabetic polyneuropathy: Secondary | ICD-10-CM

## 2016-03-13 MED ORDER — TRAMADOL HCL 50 MG PO TABS: 100.0000 mg | ORAL_TABLET | Freq: Two times a day (BID) | ORAL | 0 refills | Status: DC | PRN

## 2016-03-13 MED FILL — $LANTUS 100 UNITS/ML VIAL: 100 | 28 days supply | Qty: 10 | Fill #1

## 2016-03-13 MED FILL — GABAPENTIN 300 MG CAPSULE: 300 | 30 days supply | Qty: 90 | Fill #0

## 2016-03-13 MED FILL — METFORMIN HCL ER 500 MG TAB: 500 | 30 days supply | Qty: 60 | Fill #1

## 2016-03-13 NOTE — Telephone Encounter (Signed)
Will route to PCP 

## 2016-03-13 NOTE — Telephone Encounter (Signed)
Pt presented requesting a refill of his Tramadol. States that he will be here Monday to pick up other meds and would like to know if it may possible be ready by Monday. Please f/u.

## 2016-03-13 NOTE — Telephone Encounter (Signed)
tramadol  Rx ready for pick up

## 2016-03-20 ENCOUNTER — Encounter (HOSPITAL_COMMUNITY): Payer: Self-pay | Admitting: Emergency Medicine

## 2016-03-20 ENCOUNTER — Encounter (HOSPITAL_COMMUNITY): Payer: Self-pay

## 2016-03-20 ENCOUNTER — Emergency Department (HOSPITAL_COMMUNITY): Payer: Self-pay

## 2016-03-20 ENCOUNTER — Emergency Department (HOSPITAL_COMMUNITY)
Admission: EM | Admit: 2016-03-20 | Discharge: 2016-03-20 | Disposition: A | Discharge status: Home / Self Care | Payer: Self-pay | Attending: Emergency Medicine | Admitting: Emergency Medicine

## 2016-03-20 DIAGNOSIS — E1142 Type 2 diabetes mellitus with diabetic polyneuropathy: Secondary | ICD-10-CM | POA: Insufficient documentation

## 2016-03-20 DIAGNOSIS — I10 Essential (primary) hypertension: Secondary | ICD-10-CM | POA: Insufficient documentation

## 2016-03-20 DIAGNOSIS — N3289 Other specified disorders of bladder: Secondary | ICD-10-CM | POA: Insufficient documentation

## 2016-03-20 DIAGNOSIS — N401 Enlarged prostate with lower urinary tract symptoms: Secondary | ICD-10-CM | POA: Insufficient documentation

## 2016-03-20 DIAGNOSIS — Z794 Long term (current) use of insulin: Secondary | ICD-10-CM | POA: Insufficient documentation

## 2016-03-20 DIAGNOSIS — R39198 Other difficulties with micturition: Secondary | ICD-10-CM | POA: Insufficient documentation

## 2016-03-20 DIAGNOSIS — N400 Enlarged prostate without lower urinary tract symptoms: Secondary | ICD-10-CM

## 2016-03-20 DIAGNOSIS — R102 Pelvic and perineal pain: Secondary | ICD-10-CM

## 2016-03-20 DIAGNOSIS — Z79899 Other long term (current) drug therapy: Secondary | ICD-10-CM | POA: Insufficient documentation

## 2016-03-20 DIAGNOSIS — Z7984 Long term (current) use of oral hypoglycemic drugs: Secondary | ICD-10-CM | POA: Insufficient documentation

## 2016-03-20 DIAGNOSIS — R1030 Lower abdominal pain, unspecified: Secondary | ICD-10-CM

## 2016-03-20 HISTORY — DX: Type 2 diabetes mellitus with diabetic neuropathy, unspecified: E11.40

## 2016-03-20 LAB — I-STAT CHEM 8, ED
BUN: 17 mg/dL (ref 6–20)
BUN: 19 mg/dL (ref 6–20)
Calcium, Ion: 1.02 mmol/L — ABNORMAL LOW (ref 1.15–1.40)
Calcium, Ion: 1.13 mmol/L — ABNORMAL LOW (ref 1.15–1.40)
Chloride: 107 mmol/L (ref 101–111)
Chloride: 104 mmol/L (ref 101–111)
Creatinine, Ser: 1 mg/dL (ref 0.61–1.24)
Creatinine, Ser: 1.1 mg/dL (ref 0.61–1.24)
Glucose, Bld: 178 mg/dL — ABNORMAL HIGH (ref 65–99)
Glucose, Bld: 205 mg/dL — ABNORMAL HIGH (ref 65–99)
HCT: 43 % (ref 39.0–52.0)
HCT: 45 % (ref 39.0–52.0)
Hemoglobin: 14.6 g/dL (ref 13.0–17.0)
Hemoglobin: 15.3 g/dL (ref 13.0–17.0)
Potassium: 4.1 mmol/L (ref 3.5–5.1)
Potassium: 3.8 mmol/L (ref 3.5–5.1)
Sodium: 139 mmol/L (ref 135–145)
Sodium: 140 mmol/L (ref 135–145)
TCO2: 23 mmol/L (ref 0–100)
TCO2: 22 mmol/L (ref 0–100)

## 2016-03-20 LAB — BASIC METABOLIC PANEL
Anion gap: 10 (ref 5–15)
BUN: 19 mg/dL (ref 6–20)
CO2: 21 mmol/L — ABNORMAL LOW (ref 22–32)
Calcium: 9.7 mg/dL (ref 8.9–10.3)
Chloride: 105 mmol/L (ref 101–111)
Creatinine, Ser: 1.21 mg/dL (ref 0.61–1.24)
GFR calc Af Amer: >60 mL/min (ref >60)
GFR calc non Af Amer: >60 mL/min (ref >60)
Glucose, Bld: 178 mg/dL — ABNORMAL HIGH (ref 65–99)
Potassium: 3.4 mmol/L — ABNORMAL LOW (ref 3.5–5.1)
Sodium: 136 mmol/L (ref 135–145)

## 2016-03-20 LAB — URINALYSIS, ROUTINE W REFLEX MICROSCOPIC
Bacteria, UA: NONE SEEN
Bacteria, UA: NONE SEEN
Bilirubin Urine: NEGATIVE
Bilirubin Urine: NEGATIVE
Glucose, UA: 50 mg/dL — AB
Glucose, UA: 50 mg/dL — AB
Hgb urine dipstick: NEGATIVE
Hgb urine dipstick: NEGATIVE
Ketones, ur: 5 mg/dL — AB
Ketones, ur: 5 mg/dL — AB
Leukocytes, UA: NEGATIVE
Leukocytes, UA: NEGATIVE
Nitrite: NEGATIVE
Nitrite: NEGATIVE
Protein, ur: 30 mg/dL — AB
Protein, ur: 100 mg/dL — AB
Specific Gravity, Urine: 1.016 (ref 1.005–1.030)
Specific Gravity, Urine: 1.036 — ABNORMAL HIGH (ref 1.005–1.030)
Squamous Epithelial / LPF: NONE SEEN
Squamous Epithelial / LPF: NONE SEEN
pH: 7 (ref 5.0–8.0)
pH: 8 (ref 5.0–8.0)

## 2016-03-20 LAB — CBC WITH DIFFERENTIAL/PLATELET
Basophils Absolute: 0 10*3/uL (ref 0.0–0.1)
Basophils Relative: 0 %
Eosinophils Absolute: 0 10*3/uL (ref 0.0–0.7)
Eosinophils Relative: 0 %
HCT: 39.3 % (ref 39.0–52.0)
Hemoglobin: 14.2 g/dL (ref 13.0–17.0)
Lymphocytes Relative: 16 %
Lymphs Abs: 1.4 10*3/uL (ref 0.7–4.0)
MCH: 27.4 pg (ref 26.0–34.0)
MCHC: 36.1 g/dL — ABNORMAL HIGH (ref 30.0–36.0)
MCV: 75.7 fL — ABNORMAL LOW (ref 78.0–100.0)
Monocytes Absolute: 0.4 10*3/uL (ref 0.1–1.0)
Monocytes Relative: 5 %
Neutro Abs: 6.7 10*3/uL (ref 1.7–7.7)
Neutrophils Relative %: 79 %
Platelets: 188 10*3/uL (ref 150–400)
RBC: 5.19 MIL/uL (ref 4.22–5.81)
RDW: 13.4 % (ref 11.5–15.5)
WBC: 8.6 10*3/uL (ref 4.0–10.5)

## 2016-03-20 MED ORDER — IOPAMIDOL (ISOVUE-300) INJECTION 61%
INTRAVENOUS | Status: AC
  Administered 2016-03-20: 100 mL
  Filled 2016-03-20: qty 100

## 2016-03-20 MED ORDER — ONDANSETRON 4 MG PO TBDP
4.0000 mg | ORAL_TABLET | Freq: Once | ORAL | Status: AC
  Administered 2016-03-20: 4 mg via ORAL
  Filled 2016-03-20: qty 1

## 2016-03-20 MED ORDER — HYOSCYAMINE SULFATE 0.125 MG PO TABS
0.2500 mg | ORAL_TABLET | Freq: Once | ORAL | Status: AC
  Administered 2016-03-20: 0.25 mg via ORAL
  Filled 2016-03-20: qty 2

## 2016-03-20 MED ORDER — HYOSCYAMINE SULFATE SL 0.125 MG SL SUBL: SUBLINGUAL_TABLET | SUBLINGUAL | 0 refills | Status: DC

## 2016-03-20 MED ORDER — ONDANSETRON HCL 4 MG/2ML IJ SOLN
4.0000 mg | Freq: Once | INTRAMUSCULAR | Status: AC
  Administered 2016-03-20: 4 mg via INTRAVENOUS
  Filled 2016-03-20: qty 2

## 2016-03-20 MED ORDER — MORPHINE SULFATE (PF) 4 MG/ML IV SOLN
4.0000 mg | Freq: Once | INTRAVENOUS | Status: AC
  Administered 2016-03-20: 4 mg via INTRAVENOUS
  Filled 2016-03-20: qty 1

## 2016-03-20 NOTE — ED Triage Notes (Addendum)
Pt to ED from home c/o difficulty urinating today. He states he had renal failure last year d/t taking too many drugs and has intermittent trouble going - registered to be seen yesterday morning for the same thing but was able to go before actually being checked in. Pt states he feels like his bladder is full, causing pressure and discomfort. He also endorses taking "too much gabapentin" (5 capsules throughout the day). Pt A&O x 4. Reports urgency but unable to actually go. Denies fevers/other symptoms.

## 2016-03-20 NOTE — ED Provider Notes (Signed)
[facility] DEPT Provider Note   CSN: [id number] Arrival date & time: 03/20/16  1221      History   Chief Complaint Chief Complaint  Patient presents with  . Urinary Retention    HPI   Blood pressure 180/100, pulse 70, temperature 98 F (36.7 C), temperature source Oral, resp. rate 19, height 6\' 2"  (1.88 m), weight 111.1 kg, SpO2 100 %.  [name] [name] is a 56 y.o. male complaining ofSensation of incomplete void he states he last urinated 4 hours ago and he is extremely uncomfortable with lower pelvic pain. He was seen for similar yesterday and had negative workup including UA and KUB with negative post void residuals. He was sent to [facility] he showed up there this morning and was told he could be seen as a walk-in and presented to the ED for further evaluation. He rates his pain at 10 out of 10 and states that he needs to urinate he has been constipated as well, he takes tramadol for chronic pain and does not take a stool softener.   Past Medical History:  Diagnosis Date  . Depressed bipolar disorder ([place])   . Diabetes mellitus without complication ([place])   . Diabetic neuropathy ([place])   . Hypertension   . Neuropathy in diabetes ([place])   . Sleep apnea     Patient Active Problem List   Diagnosis Date Noted  . DJD (degenerative joint disease) of cervical spine 02/26/2016  . Neuropathic pain 10/03/2015  . Tinea pedis of both feet 06/03/2015  . Diabetic polyneuropathy associated with type 2 diabetes mellitus ([place]) 06/03/2015  . Scalp wound 06/03/2015  . Pre-ulcerative calluses 06/01/2015  . Pressure ulcer 02/22/2015  . Bipolar I disorder, most recent episode depressed ([place]) 02/21/2015  . Type 2 diabetes mellitus with diabetic polyneuropathy ([place]) 06/09/2014  . Depressed bipolar disorder ([place]) 01/01/2014  . HERPES ZOSTER 12/05/2008  . Hyperlipidemia 05/11/2008  . DEPRESSION 05/11/2008  . Essential hypertension 05/11/2008  . GERD 05/11/2008  . OSTEOARTHRITIS  05/11/2008  . LOW BACK PAIN 05/11/2008  . COLONIC POLYPS, HX OF 05/11/2008    Past Surgical History:  Procedure Laterality Date  . SHOULDER ARTHROSCOPY WITH ROTATOR CUFF REPAIR Right 2009  . SPINE SURGERY  2010       Home Medications    Prior to Admission medications   Medication Sig Start Date End Date Taking? Authorizing Provider  amLODipine (NORVASC) 10 MG tablet Take 1 tablet (10 mg total) by mouth daily. 02/05/16  Yes Josalyn Funches, MD  atorvastatin (LIPITOR) 40 MG tablet Take 1 tablet (40 mg total) by mouth daily at 6 PM. 02/05/16  Yes Josalyn Funches, MD  fenofibrate (TRICOR) 145 MG tablet Take 1 tablet (145 mg total) by mouth daily. 02/05/16  Yes Josalyn Funches, MD  Insulin Glargine (LANTUS SOLOSTAR) 100 UNIT/ML Solostar Pen Inject 35 Units into the skin daily. Patient taking differently: Inject 35 Units into the skin at bedtime.  02/05/16  Yes Josalyn Funches, MD  metFORMIN (GLUCOPHAGE XR) 500 MG 24 hr tablet Take 2 tablets (1,000 mg total) by mouth daily after supper. 02/05/16  Yes Josalyn Funches, MD  nortriptyline (PAMELOR) 10 MG capsule Take 10-20 mg by mouth at bedtime as needed for sleep.   Yes Historical Provider, MD  prazosin (MINIPRESS) 1 MG capsule Take 1-2 mg by mouth at bedtime.   Yes Historical Provider, MD  QUEtiapine (SEROQUEL) 300 MG tablet Take 300 mg by mouth at bedtime.   Yes Historical Provider, MD  traMADol (  ULTRAM) 50 MG tablet Take 2 tablets (100 mg total) by mouth every 12 (twelve) hours as needed for severe pain. 03/13/16  Yes Josalyn Funches, MD  gabapentin ([name]) 300 MG capsule Take 1 capsule (300 mg total) by mouth 3 (three) times daily. Patient not taking: Reported on 03/20/2016 02/05/16   Boykin Nearing, MD  Hyoscyamine Sulfate SL (LEVSIN/SL) 0.125 MG SUBL 0.125 to 0.250 SL q4hrs PRN 03/20/16   Monico Blitz, PA-C    Family History Family History  Problem Relation Age of Onset  . Diabetes Mother   . Hyperlipidemia Mother   . Heart disease  Father     Social History Social History  Substance Use Topics  . Smoking status: Never Smoker  . Smokeless tobacco: Never Used  . Alcohol use No     Allergies   Heparin; Oxytetracycline; and Sulfonamide derivatives   Review of Systems Review of Systems  10 systems reviewed and found to be negative, except as noted in the HPI.   Physical Exam Updated Vital Signs BP (!) 181/101 (BP Location: Left Arm)   Pulse 70   Temp 98.2 F (36.8 C) (Oral)   Resp 20   Ht 6\' 2"  (1.88 m)   Wt 111.1 kg   SpO2 100%   BMI 31.46 kg/m   Physical Exam  Constitutional: He is oriented to person, place, and time. He appears well-developed and well-nourished. No distress.  HENT:  Head: Normocephalic and atraumatic.  Mouth/Throat: Oropharynx is clear and moist.  Eyes: Conjunctivae and EOM are normal. Pupils are equal, round, and reactive to light.  Neck: Normal range of motion.  Cardiovascular: Normal rate, regular rhythm and intact distal pulses.   Pulmonary/Chest: Effort normal and breath sounds normal.  Abdominal: Soft. There is no tenderness.  Musculoskeletal: Normal range of motion.  Neurological: He is alert and oriented to person, place, and time.  Skin: He is not diaphoretic.  Psychiatric: He has a normal mood and affect.  Nursing note and vitals reviewed.    ED Treatments / Results  Labs (all labs ordered are listed, but only abnormal results are displayed) Labs Reviewed  URINALYSIS, ROUTINE W REFLEX MICROSCOPIC - Abnormal; Notable for the following:       Result Value   Specific Gravity, Urine 1.036 (*)    Glucose, UA 50 (*)    Ketones, ur 5 (*)    Protein, ur 100 (*)    All other components within normal limits  CBC WITH DIFFERENTIAL/PLATELET - Abnormal; Notable for the following:    MCV 75.7 (*)    MCHC 36.1 (*)    All other components within normal limits  BASIC METABOLIC PANEL - Abnormal; Notable for the following:    Potassium 3.4 (*)    CO2 21 (*)     Glucose, Bld 178 (*)    All other components within normal limits  I-STAT CHEM 8, ED - Abnormal; Notable for the following:    Glucose, Bld 205 (*)    Calcium, Ion 1.13 (*)    All other components within normal limits    EKG  EKG Interpretation None       Radiology Ct Abdomen Pelvis W Contrast  Result Date: 03/20/2016 CLINICAL DATA:  Lower abdominal pain, urinary retention EXAM: CT ABDOMEN AND PELVIS WITH CONTRAST TECHNIQUE: Multidetector CT imaging of the abdomen and pelvis was performed using the standard protocol following bolus administration of intravenous contrast. CONTRAST:  100 mL Isovue 300 IV COMPARISON:  02/20/2015 FINDINGS: Lower chest: Lung bases  are clear. Hepatobiliary: Liver is within normal limits. Gallbladder is unremarkable. No intrahepatic or extrahepatic ductal dilatation. Pancreas: Within normal limits. Spleen: Within normal limits. Adrenals/Urinary Tract: Adrenal glands are within normal limits. 4 mm cyst along the lateral left upper kidney (series 2/ image 23). Right kidney is within normal limits. No hydronephrosis. Bladder is within normal limits. Stomach/Bowel: Stomach is within normal limits. No evidence of bowel obstruction. Normal appendix (series 2/ image 60). Vascular/Lymphatic: No evidence of abdominal aortic aneurysm. Atherosclerotic calcifications of the abdominal aorta and branch vessels. No suspicious abdominopelvic lymphadenopathy. Reproductive: Prostate is notable for mild larger of the central gland which indents the base of the bladder. Other: No abdominopelvic ascites. Tiny fat containing right inguinal hernia. Fat within the left inguinal canal. Musculoskeletal: Mild degenerative changes of the visualized thoracolumbar spine. IMPRESSION: No renal, ureteral, or bladder calculi.  No hydronephrosis. Bladder is within normal limits on CT. Prostate is notable for mild enlargement of the central gland. Additional ancillary findings as above. Electronically Signed    By: Julian Hy M.D.   On: 03/20/2016 15:00   Dg Abd Acute W/chest  Result Date: 03/20/2016 CLINICAL DATA:  No bowel movement for 4 days with urinary retention. Groin and bladder pain. EXAM: DG ABDOMEN ACUTE W/ 1V CHEST COMPARISON:  Chest x-ray 03/22/2015 FINDINGS: The lungs are clear wiithout focal pneumonia, edema, pneumothorax or pleural effusion. The cardiopericardial silhouette is within normal limits for size. Upright film shows no evidence for intraperitoneal free air. There is no evidence for gaseous bowel dilation to suggest obstruction. Patient is status post resection distal right clavicle. Remaining visualized bony anatomy is unremarkable. IMPRESSION: Negative abdominal radiographs.  No acute cardiopulmonary disease. Electronically Signed   By: Misty Stanley M.D.   On: 03/20/2016 09:53    Procedures Procedures (including critical care time)  Medications Ordered in ED Medications  morphine 4 MG/ML injection 4 mg (4 mg Intravenous Given 03/20/16 1411)  ondansetron (ZOFRAN) injection 4 mg (4 mg Intravenous Given 03/20/16 1411)  iopamidol (ISOVUE-300) 61 % injection (100 mLs  Contrast Given 03/20/16 1442)  hyoscyamine (LEVSIN, ANASPAZ) tablet 0.25 mg (0.25 mg Oral Given 03/20/16 1544)     Initial Impression / Assessment and Plan / ED Course  I have reviewed the triage vital signs and the nursing notes.  Pertinent labs & imaging results that were available during my care of the patient were reviewed by me and considered in my medical decision making (see chart for details).     Vitals:   03/20/16 1229 03/20/16 1235 03/20/16 1513 03/20/16 1651  BP: 180/100  (!) 184/101 (!) 181/101  Pulse: 70  71 70  Resp: 19  16 20   Temp: 98 F (36.7 C)   98.2 F (36.8 C)  TempSrc: Oral   Oral  SpO2: 100%  100% 100%  Weight:  111.1 kg    Height:  6\' 2"  (1.88 m)      Medications  morphine 4 MG/ML injection 4 mg (4 mg Intravenous Given 03/20/16 1411)  ondansetron (ZOFRAN) injection 4 mg (4  mg Intravenous Given 03/20/16 1411)  iopamidol (ISOVUE-300) 61 % injection (100 mLs  Contrast Given 03/20/16 1442)  hyoscyamine (LEVSIN, ANASPAZ) tablet 0.25 mg (0.25 mg Oral Given 03/20/16 1544)    [name] [name] is 56 y.o. male presenting with Sensation of incomplete void however repeated bladder scans are negative, attending physician has performed a ultrasound and 100 mL were in the bladder. Normal prostate exam, normal stool in the rectal vault. Urinalysis  is without signs of infection and CT negative, patient was some improvement with Levsin, encouraged him to follow closely with both primary care and urology.  Evaluation does not show pathology that would require ongoing emergent intervention or inpatient treatment. Pt is hemodynamically stable and mentating appropriately. Discussed findings and plan with patient/guardian, who agrees with care plan. All questions answered. Return precautions discussed and outpatient follow up given.    Final Clinical Impressions(s) / ED Diagnoses   Final diagnoses:  Lower abdominal pain  Pelvic pain  Bladder spasm  Prostate enlargement    New Prescriptions Discharge Medication List as of 03/20/2016  4:33 PM    START taking these medications   Details  Hyoscyamine Sulfate SL (LEVSIN/SL) 0.125 MG SUBL 0.125 to 0.250 SL q4hrs PRN, Print         Monico Blitz, PA-C 03/20/16 1803    Fatima Blank, MD 03/21/16 1752

## 2016-03-20 NOTE — ED Notes (Signed)
Bladder scan 270ML

## 2016-03-20 NOTE — ED Notes (Signed)
Patient transported to X-ray 

## 2016-03-20 NOTE — ED Notes (Signed)
Checked with pt.  He has attempted to void without success again.  EDP did bladder ultrasound and measured 100cc in the bladder. Encouraged him to attempt again as soon as practical

## 2016-03-20 NOTE — ED Notes (Signed)
Bladder scan showed 69ml.  Second staff member to scan pt as he feels distinct feeling of urinary retention

## 2016-03-20 NOTE — ED Notes (Signed)
Pt verbalizes understanding of importance of follow up with urology and primary care provider. Pt given instruction to take home medication for pain, per PA. A/o x4 on departure, ambulatory with steady gait.

## 2016-03-20 NOTE — ED Triage Notes (Signed)
Patient reports that he has been having urinary retention since yesterday. Patient states he went to [facility] and was told to go to [facility] urology, but was told to come to the ED. Patient reports having 10/10 pain.

## 2016-03-20 NOTE — ED Provider Notes (Signed)
[facility]-EMERGENCY DEPT Provider Note   CSN: [id number] Arrival date & time: 03/20/16  0201     History   Chief Complaint Chief Complaint  Patient presents with  . Urinary Retention    HPI [name] [name] is a 56 y.o. male.  The history is provided by the patient and medical records.    56 y.o. M with hx of bipolar disorder, DM2, diabetic neuropathy, HTN, sleep apnea, presenting to the ED for urinary retention.  Patient reports this began yesterday and he actually came to the ED yesterday evening but was able to urinate once he got here so he decided to go home.  States throughout the night he still was having trouble urinating so he came back.  States he was able to provide urine sample here which was a small amount in the urine but still feels the urine to urinate.  States his bladder just feels full.  Reports hx of same last month and he talked to his doctor who referred him to urology but it got better so he never went.  He denies any flank pain, abdominal pain, fever, chills, nausea, vomiting, diarrhea, back pain, or leg numbness/weakness.  No hx of prostate issues.  Does report hx of renal failure in the past after an intention overdose of his medications.  He denies taking any excess of his medications today, states taking as prescribed.  Denies SI/HI/AVH.  Past Medical History:  Diagnosis Date  . Depressed bipolar disorder ([facility])   . Diabetes mellitus without complication ([facility])   . Diabetic neuropathy ([facility])   . Hypertension   . Neuropathy in diabetes ([facility])   . Sleep apnea     Patient Active Problem List   Diagnosis Date Noted  . DJD (degenerative joint disease) of cervical spine 02/26/2016  . Neuropathic pain 10/03/2015  . Tinea pedis of both feet 06/03/2015  . Diabetic polyneuropathy associated with type 2 diabetes mellitus ([facility]) 06/03/2015  . Scalp wound 06/03/2015  . Pre-ulcerative calluses 06/01/2015  . Pressure ulcer 02/22/2015  . Bipolar I disorder, most recent  episode depressed ([facility]) 02/21/2015  . Type 2 diabetes mellitus with diabetic polyneuropathy ([facility]) 06/09/2014  . Depressed bipolar disorder ([facility]) 01/01/2014  . HERPES ZOSTER 12/05/2008  . Hyperlipidemia 05/11/2008  . DEPRESSION 05/11/2008  . Essential hypertension 05/11/2008  . GERD 05/11/2008  . OSTEOARTHRITIS 05/11/2008  . LOW BACK PAIN 05/11/2008  . COLONIC POLYPS, HX OF 05/11/2008    Past Surgical History:  Procedure Laterality Date  . SHOULDER ARTHROSCOPY WITH ROTATOR CUFF REPAIR Right 2009  . SPINE SURGERY  2010       Home Medications    Prior to Admission medications   Medication Sig Start Date End Date Taking? Authorizing Provider  amLODipine (NORVASC) 10 MG tablet Take 1 tablet (10 mg total) by mouth daily. 02/05/16  Yes Josalyn Funches, MD  atorvastatin (LIPITOR) 40 MG tablet Take 1 tablet (40 mg total) by mouth daily at 6 PM. 02/05/16  Yes Josalyn Funches, MD  fenofibrate (TRICOR) 145 MG tablet Take 1 tablet (145 mg total) by mouth daily. 02/05/16  Yes Josalyn Funches, MD  gabapentin ([name]) 300 MG capsule Take 1 capsule (300 mg total) by mouth 3 (three) times daily. 02/05/16  Yes Josalyn Funches, MD  Insulin Glargine (LANTUS SOLOSTAR) 100 UNIT/ML Solostar Pen Inject 35 Units into the skin daily. 02/05/16  Yes Josalyn Funches, MD  ketoconazole (NIZORAL) 2 % cream Apply 1 application topically 2 (two) times daily. 06/01/15  Yes Josalyn Funches,  MD  metFORMIN (GLUCOPHAGE XR) 500 MG 24 hr tablet Take 2 tablets (1,000 mg total) by mouth daily after supper. 02/05/16  Yes Josalyn Funches, MD  OLANZapine (ZYPREXA) 5 MG tablet Take 1 tablet (5 mg total) by mouth at bedtime. 03/22/15  Yes Lance Bosch, NP  QUEtiapine (SEROQUEL XR) 50 MG TB24 24 hr tablet Take 100 mg by mouth at bedtime.   Yes Historical Provider, MD  traMADol (ULTRAM) 50 MG tablet Take 2 tablets (100 mg total) by mouth every 12 (twelve) hours as needed for severe pain. 03/13/16  Yes Josalyn Funches, MD  Blood Glucose  Monitoring Suppl (TRUE METRIX METER) w/Device KIT Use as directed 04/17/15   Tresa Garter, MD  glucose blood (TRUE METRIX BLOOD GLUCOSE TEST) test strip Use as instructed 04/17/15   Tresa Garter, MD  Insulin Pen Needle (B-D ULTRAFINE III SHORT PEN) 31G X 8 MM MISC Check blood sugar TID and QHS 03/21/15   Lance Bosch, NP  Insulin Syringe-Needle U-100 (B-D INS SYRINGE 0.5CC/31GX5/16) 31G X 5/16" 0.5 ML MISC 1 each by Does not apply route 3 (three) times daily. 09/14/15   Boykin Nearing, MD  TRUEPLUS LANCETS 28G MISC Use as needed for up to 4 times daily testing of blood sugar 04/17/15   Tresa Garter, MD    Family History Family History  Problem Relation Age of Onset  . Diabetes Mother   . Hyperlipidemia Mother   . Heart disease Father     Social History Social History  Substance Use Topics  . Smoking status: Never Smoker  . Smokeless tobacco: Never Used  . Alcohol use No     Allergies   Heparin; Oxytetracycline; and Sulfonamide derivatives   Review of Systems Review of Systems  Genitourinary: Positive for difficulty urinating.  All other systems reviewed and are negative.    Physical Exam Updated Vital Signs BP 169/100   Pulse 62   Temp 97.6 F (36.4 C) (Oral)   Resp 18   SpO2 100%   Physical Exam  Constitutional: He is oriented to person, place, and time. He appears well-developed and well-nourished.  Lying in stretcher, resting comfortably, NAD  HENT:  Head: Normocephalic and atraumatic.  Mouth/Throat: Oropharynx is clear and moist.  Eyes: Conjunctivae and EOM are normal. Pupils are equal, round, and reactive to light.  Neck: Normal range of motion.  Cardiovascular: Normal rate, regular rhythm and normal heart sounds.   Pulmonary/Chest: Effort normal and breath sounds normal. No respiratory distress. He has no wheezes.  Abdominal: Soft. Bowel sounds are normal. There is no tenderness. There is no rebound.  Endorses "pressure" in the suprapubic  region but no focal tenderness, no CVA tenderness, normal bowel sounds, no apparent distention  Musculoskeletal: Normal range of motion.  Neurological: He is alert and oriented to person, place, and time.  Skin: Skin is warm and dry.  Psychiatric: He has a normal mood and affect.  Nursing note and vitals reviewed.    ED Treatments / Results  Labs (all labs ordered are listed, but only abnormal results are displayed) Labs Reviewed  URINALYSIS, ROUTINE W REFLEX MICROSCOPIC - Abnormal; Notable for the following:       Result Value   Glucose, UA 50 (*)    Ketones, ur 5 (*)    Protein, ur 30 (*)    All other components within normal limits  I-STAT CHEM 8, ED - Abnormal; Notable for the following:    Glucose, Bld 178 (*)  Calcium, Ion 1.02 (*)    All other components within normal limits  URINE CULTURE    EKG  EKG Interpretation None       Radiology Dg Abd Acute W/chest  Result Date: 03/20/2016 CLINICAL DATA:  No bowel movement for 4 days with urinary retention. Groin and bladder pain. EXAM: DG ABDOMEN ACUTE W/ 1V CHEST COMPARISON:  Chest x-ray 03/22/2015 FINDINGS: The lungs are clear wiithout focal pneumonia, edema, pneumothorax or pleural effusion. The cardiopericardial silhouette is within normal limits for size. Upright film shows no evidence for intraperitoneal free air. There is no evidence for gaseous bowel dilation to suggest obstruction. Patient is status post resection distal right clavicle. Remaining visualized bony anatomy is unremarkable. IMPRESSION: Negative abdominal radiographs.  No acute cardiopulmonary disease. Electronically Signed   By: Misty Stanley M.D.   On: 03/20/2016 09:53    Procedures Procedures (including critical care time)  Medications Ordered in ED Medications - No data to display   Initial Impression / Assessment and Plan / ED Course  I have reviewed the triage vital signs and the nursing notes.  Pertinent labs & imaging results that were  available during my care of the patient were reviewed by me and considered in my medical decision making (see chart for details).  56 year old male here with urinary retention. Reports history of the same but resolved spontaneously. He is afebrile and nontoxic. He denies any abdominal pain, flank pain, back pain, numbness, or weakness of the legs.  He denies any history of prostate issues. On review of patient's medication, he is on several psychiatric medications with anticholinergic properties, notably Seroquel as well as Zyprexa which may be contributing.  Bladder scan performed here, 270cc retained, but patient was able to urinate out 220cc afterwards.  Chem -8 obtained given his hx of renal failure.  Do not feel this is related to cauda equina or other acute spinal process without any acute back pain or other neurologic compromise here.  9:14 AM Patients lab work reassuring.  SrCr was 1.0.  On recheck patient reports he feels nauseated.  He went to the bathroom to try to have a BM but now reports he has had trouble going for the past 4 days.  States he has had a BM but reports only after using enema.  States now he feels constipated.  Will get plain film, given zofran.  Patient is also insisting to have in and out foley as he feels like his bladder is full again.  Bladder scan repeated, only 53m in bladder.  Do not feel like foley clinically indicated.  Abdomen remains soft, non-distended with normal bowel sounds.  Repots he is able to pass gas when needed.  10:13 AM Abdominal films today negative for signs of obstruction or free air.  When trying to discuss patients results with him, he is now demanding pain medication.  I had a discussion with him that this will likely exacerbate his issues with urination/bowel movements.  He is also on chronic tramadol from his PCP which was last refilled 3 days ago.  Patient does have hx of opioid abuse with intentional overdose.  I have declined further narcotics  from ED.  Patient does not truly have urinary retention as he has urinated several times here in the ED, rather has sensation of incomplete bladder emptying.  Given his age, concern that there may be some underlying prostate issues.  Will refer him to urology.  He was strongly encouraged to follow-up.  Discussed plan  with patient, he acknowledged understanding and agreed with plan of care.  Return precautions given for new or worsening symptoms.  Discussed with attending physician, Dr. Darl Householder, who has reviewed labs and imaging studies, and agrees with care plan.  Final Clinical Impressions(s) / ED Diagnoses   Final diagnoses:  Difficulty urinating    New Prescriptions New Prescriptions   No medications on file     Kathryne Hitch 03/20/16 [place] Yao, MD 03/21/16 [phone]

## 2016-03-20 NOTE — Discharge Instructions (Signed)
Continue your regular medications.  Some of your psychiatric medications may be contributing to your urinary issues. Follow-up with urology, I would call them later today to make an appt.  You can have your primary care doctor do this for you as well. Return to the ED for new or worsening symptoms.

## 2016-03-20 NOTE — ED Notes (Addendum)
Pt. Was bladder scanned and read 18 ml

## 2016-03-20 NOTE — Discharge Instructions (Signed)
Please follow with your primary care doctor in the next 2 days for a check-up. They must obtain records for further management.  ° °Do not hesitate to return to the Emergency Department for any new, worsening or concerning symptoms.  ° °

## 2016-03-20 NOTE — ED Notes (Signed)
rn info this Probation officer to hold off on labs due to PT maybe discharge

## 2016-03-20 NOTE — ED Notes (Signed)
Holding off on I/O cathing, PA aware due to small amount of urine in bladder. Pt sent to xray.

## 2016-03-21 LAB — URINE CULTURE: Culture: NO GROWTH

## 2016-03-25 ENCOUNTER — Telehealth: Payer: Self-pay | Admitting: Family Medicine

## 2016-03-25 DIAGNOSIS — N3289 Other specified disorders of bladder: Secondary | ICD-10-CM

## 2016-03-25 NOTE — Telephone Encounter (Signed)
Will route to PCP 

## 2016-03-25 NOTE — Telephone Encounter (Signed)
Referral placed.

## 2016-03-25 NOTE — Telephone Encounter (Signed)
I received a message from [facility] and they are needing a referral for patient  [name] [name] .They need it in order to f/u from a hospital visit.   Thank you

## 2016-03-27 ENCOUNTER — Ambulatory Visit: Payer: Self-pay | Admitting: Family Medicine

## 2016-04-15 ENCOUNTER — Ambulatory Visit: Payer: Self-pay | Attending: Family Medicine | Admitting: Family Medicine

## 2016-04-15 ENCOUNTER — Encounter: Payer: Self-pay | Admitting: Family Medicine

## 2016-04-15 VITALS — BP 176/69 | HR 63 | Temp 98.3°F | Wt 251.2 lb

## 2016-04-15 DIAGNOSIS — L84 Corns and callosities: Secondary | ICD-10-CM

## 2016-04-15 DIAGNOSIS — Z794 Long term (current) use of insulin: Secondary | ICD-10-CM | POA: Insufficient documentation

## 2016-04-15 DIAGNOSIS — E1142 Type 2 diabetes mellitus with diabetic polyneuropathy: Secondary | ICD-10-CM | POA: Insufficient documentation

## 2016-04-15 DIAGNOSIS — I10 Essential (primary) hypertension: Secondary | ICD-10-CM | POA: Insufficient documentation

## 2016-04-15 DIAGNOSIS — Z79899 Other long term (current) drug therapy: Secondary | ICD-10-CM | POA: Insufficient documentation

## 2016-04-15 LAB — GLUCOSE, POCT (MANUAL RESULT ENTRY): POC Glucose: 247 mg/dl — AB (ref 70–99)

## 2016-04-15 MED ORDER — INSULIN GLARGINE 100 UNIT/ML SOLOSTAR PEN: 40.0000 [IU] | PEN_INJECTOR | Freq: Every day | SUBCUTANEOUS | 11 refills | Status: DC

## 2016-04-15 MED ORDER — FUROSEMIDE 40 MG PO TABS
40.0000 mg | ORAL_TABLET | Freq: Every day | ORAL | 0 refills | Status: DC
Start: 2016-04-15 — End: 2016-05-23

## 2016-04-15 MED ORDER — TRAMADOL HCL 50 MG PO TABS: 100.0000 mg | ORAL_TABLET | Freq: Two times a day (BID) | ORAL | 0 refills | Status: DC | PRN

## 2016-04-15 MED FILL — FUROSEMIDE 40 MG TABLET: 40 | 15 days supply | Qty: 15 | Fill #0

## 2016-04-15 NOTE — Progress Notes (Signed)
Pt is here today for diabetes. Pt is having swelling in legs and feet.
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
## 2016-04-15 NOTE — Patient Instructions (Addendum)
[name] [name] was seen today for diabetes.  Diagnoses and all orders for this visit:  Type 2 diabetes mellitus with diabetic polyneuropathy, with long-term current use of insulin (HCC) -     POCT glucose (manual entry) -     Insulin Glargine (LANTUS SOLOSTAR) 100 UNIT/ML Solostar Pen; Inject 40 Units into the skin daily.  Diabetic polyneuropathy associated with type 2 diabetes mellitus (HCC) -     traMADol (ULTRAM) 50 MG tablet; Take 2 tablets (100 mg total) by mouth every 12 (twelve) hours as needed for severe pain.  Essential hypertension -     BASIC METABOLIC PANEL WITH GFR -     CBC -     Brain natriuretic peptide -     furosemide (LASIX) 40 MG tablet; Take 1 tablet (40 mg total) by mouth daily.   f/u in 2 weeks for leg swelling and HTN   Dr. Adrian Blackwater

## 2016-04-16 LAB — CBC
Hematocrit: 35.1 % — ABNORMAL LOW (ref 37.5–51.0)
Hemoglobin: 12.1 g/dL — ABNORMAL LOW (ref 13.0–17.7)
MCH: 26.9 pg (ref 26.6–33.0)
MCHC: 34.5 g/dL (ref 31.5–35.7)
MCV: 78 fL — ABNORMAL LOW (ref 79–97)
Platelets: 187 10*3/uL (ref 150–379)
RBC: 4.5 x10E6/uL (ref 4.14–5.80)
RDW: 14.5 % (ref 12.3–15.4)
WBC: 4.7 10*3/uL (ref 3.4–10.8)

## 2016-04-16 LAB — BMP8+EGFR
BUN/Creatinine Ratio: 18 (ref 9–20)
BUN: 18 mg/dL (ref 6–24)
CO2: 22 mmol/L (ref 18–29)
Calcium: 9 mg/dL (ref 8.7–10.2)
Chloride: 97 mmol/L (ref 96–106)
Creatinine, Ser: 1.02 mg/dL (ref 0.76–1.27)
GFR calc Af Amer: 96 mL/min/{1.73_m2} (ref >59)
GFR calc non Af Amer: 83 mL/min/{1.73_m2} (ref >59)
Glucose: 233 mg/dL — ABNORMAL HIGH (ref 65–99)
Potassium: 4.2 mmol/L (ref 3.5–5.2)
Sodium: 136 mmol/L (ref 134–144)

## 2016-04-16 LAB — BRAIN NATRIURETIC PEPTIDE: BNP: 227 pg/mL — ABNORMAL HIGH (ref 0.0–100.0)

## 2016-04-16 MED FILL — $LANTUS 100 UNITS/ML VIAL: 100 | 50 days supply | Qty: 20 | Fill #0

## 2016-04-17 NOTE — Assessment & Plan Note (Signed)
Recurrent Patient to f/u with podiatry

## 2016-04-17 NOTE — Assessment & Plan Note (Addendum)
A: HTN with leg swelling P: Continue norvasc 10 mg daily Add lasix 40 mg daily BNP is slightly elevated Renal function is normal Slight anemia Trend BNP Consider ECHO at f/u

## 2016-04-18 NOTE — Progress Notes (Signed)
Pt was informed of lab results by [place] and pt also has a copy of lab results.

## 2016-04-30 MED FILL — METFORMIN HCL ER 500 MG TAB: 500 | 30 days supply | Qty: 60 | Fill #2

## 2016-05-08 ENCOUNTER — Ambulatory Visit: Payer: No Typology Code available for payment source | Admitting: Podiatry

## 2016-05-09 ENCOUNTER — Ambulatory Visit: Payer: Self-pay | Admitting: Family Medicine

## 2016-05-09 ENCOUNTER — Ambulatory Visit (INDEPENDENT_AMBULATORY_CARE_PROVIDER_SITE_OTHER): Payer: No Typology Code available for payment source | Admitting: Podiatry

## 2016-05-09 ENCOUNTER — Encounter: Payer: Self-pay | Admitting: Podiatry

## 2016-05-09 VITALS — BP 144/83 | HR 55

## 2016-05-09 DIAGNOSIS — L97512 Non-pressure chronic ulcer of other part of right foot with fat layer exposed: Secondary | ICD-10-CM

## 2016-05-09 DIAGNOSIS — E1140 Type 2 diabetes mellitus with diabetic neuropathy, unspecified: Secondary | ICD-10-CM

## 2016-05-09 DIAGNOSIS — E1149 Type 2 diabetes mellitus with other diabetic neurological complication: Secondary | ICD-10-CM

## 2016-05-09 DIAGNOSIS — B351 Tinea unguium: Secondary | ICD-10-CM

## 2016-05-09 NOTE — Progress Notes (Signed)
Complaint:  Visit Type: Patient returns to my office for continued preventative foot care services. Complaint: Patient states" his nails have grown thick and long are painful walking and wearing his shoes. He says the ulcer. This been on his big toe, right foot has healed since there is no drainage and no bleeding from the ulcer. This ulcer is covered by a thickened red, callus, which is painful when he walks. He presents the office today for an evaluation of his nails and his big toe, right foot. He has been under treatment for this diabetic ulcer on the right hallux for over a year.  Podiatric Exam: Vascular: dorsalis pedis and posterior tibial pulses are palpable bilateral. Capillary return is immediate. Temperature gradient is WNL. Skin turgor WNL  Sensorium: Diminished Semmes Weinstein monofilament test. Normal tactile sensation bilaterally. Nail Exam: Pt has thick disfigured discolored nails with subungual debris noted bilateral entire nail hallux through fifth toenails Ulcer Exam: There is significant hemorrhagic callus noted on the medial aspect of the right hallux. No redness or swelling or drainage or bleeding noted Orthopedic Exam: Muscle tone and strength are WNL. No limitations in general ROM. No crepitus or effusions noted. Foot type and digits show no abnormalities. Bony prominences are unremarkable. Skin: No Porokeratosis. No infection or ulcers  Diagnosis:  Onychomycosis, , Pain in right toe, pain in left toes,  Diabetic ulcer right hallux.  Treatment & Plan Procedures and Treatment: Consent by patient was obtained for treatment procedures. The patient understood the discussion of treatment and procedures well. All questions were answered thoroughly reviewed. Debridement of mycotic and hypertrophic toenails, 1 through 5 bilateral and clearing of subungual debris. No ulceration, no infection noted. Debridement of callus covering persistent diabetic ulcer right hallux., Neosporin/DSD  was applied.  Hone instructions given. Return Visit-Office Procedure: Patient instructed to return to the office for a follow up visit 2 weeks.for continued evaluation and treatment.    Gardiner Barefoot DPM

## 2016-05-22 ENCOUNTER — Encounter: Payer: Self-pay | Admitting: Podiatry

## 2016-05-22 ENCOUNTER — Ambulatory Visit (INDEPENDENT_AMBULATORY_CARE_PROVIDER_SITE_OTHER): Payer: No Typology Code available for payment source | Admitting: Podiatry

## 2016-05-22 DIAGNOSIS — G629 Polyneuropathy, unspecified: Secondary | ICD-10-CM

## 2016-05-22 DIAGNOSIS — E1140 Type 2 diabetes mellitus with diabetic neuropathy, unspecified: Secondary | ICD-10-CM

## 2016-05-22 DIAGNOSIS — L97512 Non-pressure chronic ulcer of other part of right foot with fat layer exposed: Secondary | ICD-10-CM

## 2016-05-22 DIAGNOSIS — E1149 Type 2 diabetes mellitus with other diabetic neurological complication: Secondary | ICD-10-CM

## 2016-05-22 NOTE — Progress Notes (Signed)
Complaint:  Visit Type: Patient returns to my office for continued evaluation of diabetic ulcer right big toe.  He says he has not developed any callus and there has been no drainage from the ulcer. He has been soaking and taking better care of his right foot.  Podiatric Exam: Vascular: dorsalis pedis and posterior tibial pulses are palpable bilateral. Capillary return is immediate. Temperature gradient is WNL. Skin turgor WNL  Sensorium: Diminished Semmes Weinstein monofilament test. Normal tactile sensation bilaterally. Nail Exam: Pt has thick disfigured discolored nails with subungual debris noted bilateral entire nail hallux through fifth toenails Ulcer Exam:. No redness or swelling or drainage or bleeding noted.  Healing at sight of previous ulcer.  No infection noted at ulcer site. Orthopedic Exam: Muscle tone and strength are WNL. No limitations in general ROM. No crepitus or effusions noted. Foot type and digits show no abnormalities. Bony prominences are unremarkable. Skin: No Porokeratosis. No infection or ulcers  Diagnosis   Diabetic ulcer right hallux.  Treatment & Plan Procedures and Treatment: Consent by patient was obtained for treatment procedures. The patient understood the discussion of treatment and procedures well. All questions were answered thoroughly reviewed. Debridement of mycotic and hypertrophic toenails, 1 through 5 bilateral and clearing of subungual debris. No ulceration, no infection noted. Debridement of callus covering persistent diabetic ulcer right hallux., Neosporin/DSD was applied.  Hone instructions given. Return Visit-Office Procedure: Patient instructed to return to the office for a follow up visit 8 weeks.for continued evaluation and treatment.    Gardiner Barefoot DPM

## 2016-05-23 ENCOUNTER — Other Ambulatory Visit: Payer: Self-pay | Admitting: Family Medicine

## 2016-05-23 DIAGNOSIS — I10 Essential (primary) hypertension: Secondary | ICD-10-CM

## 2016-05-23 MED FILL — ?FENOFIBRATE 145 MG TAB: 145 | 30 days supply | Qty: 30 | Fill #1

## 2016-05-23 MED FILL — FUROSEMIDE 40 MG TABLET: 40 | 15 days supply | Qty: 15 | Fill #0

## 2016-05-23 MED FILL — AMLODIPINE BESYLATE 10 MG T: 10 | 30 days supply | Qty: 30 | Fill #1

## 2016-05-28 ENCOUNTER — Encounter: Payer: Self-pay | Admitting: Family Medicine

## 2016-05-30 ENCOUNTER — Ambulatory Visit: Payer: Self-pay | Attending: Family Medicine | Admitting: Family Medicine

## 2016-05-30 ENCOUNTER — Ambulatory Visit (HOSPITAL_COMMUNITY)
Admission: RE | Admit: 2016-05-30 | Discharge: 2016-05-30 | Disposition: A | Discharge status: Home / Self Care | Payer: Self-pay | Source: Ambulatory Visit | Attending: Family Medicine | Admitting: Family Medicine

## 2016-05-30 ENCOUNTER — Encounter: Payer: Self-pay | Admitting: Family Medicine

## 2016-05-30 VITALS — BP 97/60 | HR 57 | Temp 97.8°F | Wt 246.0 lb

## 2016-05-30 DIAGNOSIS — I10 Essential (primary) hypertension: Secondary | ICD-10-CM | POA: Insufficient documentation

## 2016-05-30 DIAGNOSIS — I517 Cardiomegaly: Secondary | ICD-10-CM | POA: Insufficient documentation

## 2016-05-30 DIAGNOSIS — Z79899 Other long term (current) drug therapy: Secondary | ICD-10-CM | POA: Insufficient documentation

## 2016-05-30 DIAGNOSIS — E1142 Type 2 diabetes mellitus with diabetic polyneuropathy: Secondary | ICD-10-CM | POA: Insufficient documentation

## 2016-05-30 DIAGNOSIS — Z794 Long term (current) use of insulin: Secondary | ICD-10-CM | POA: Insufficient documentation

## 2016-05-30 LAB — POCT GLYCOSYLATED HEMOGLOBIN (HGB A1C): Hemoglobin A1C: 7.2

## 2016-05-30 LAB — GLUCOSE, POCT (MANUAL RESULT ENTRY): POC Glucose: 189 mg/dl — AB (ref 70–99)

## 2016-05-30 MED ORDER — TRAMADOL HCL 50 MG PO TABS: 100.0000 mg | ORAL_TABLET | Freq: Two times a day (BID) | ORAL | 0 refills | Status: DC | PRN

## 2016-05-30 MED ORDER — LOSARTAN POTASSIUM 50 MG PO TABS: 50.0000 mg | ORAL_TABLET | Freq: Every day | ORAL | 3 refills | Status: DC

## 2016-05-30 MED ORDER — TRAMADOL HCL 50 MG PO TABS: 100.0000 mg | ORAL_TABLET | Freq: Three times a day (TID) | ORAL | 2 refills | Status: DC

## 2016-05-30 MED ORDER — FUROSEMIDE 20 MG PO TABS: 20.0000 mg | ORAL_TABLET | Freq: Every day | ORAL | 3 refills | Status: DC

## 2016-05-30 MED FILL — FUROSEMIDE 20 MG TABLET: 20 | 30 days supply | Qty: 30 | Fill #0

## 2016-05-30 MED FILL — LOSARTAN POTASSIUM 50 MG TA: 50 | 30 days supply | Qty: 30 | Fill #0

## 2016-05-30 NOTE — Patient Instructions (Addendum)
[name] [name] was seen today for diabetes.  Diagnoses and all orders for this visit:  Type 2 diabetes mellitus with diabetic polyneuropathy, with long-term current use of insulin (HCC) -     POCT glucose (manual entry) -     POCT glycosylated hemoglobin (Hb A1C)  Essential hypertension -     DG Chest 2 View; Future -     ECHOCARDIOGRAM COMPLETE; Future -     furosemide (LASIX) 20 MG tablet; Take 1 tablet (20 mg total) by mouth daily. -     losartan (COZAAR) 50 MG tablet; Take 1 tablet (50 mg total) by mouth daily.  Diabetic polyneuropathy associated with type 2 diabetes mellitus (HCC) -     traMADol (ULTRAM) 50 MG tablet; Take 2 tablets (100 mg total) by mouth every 8 (eight) hours.    Stop norvasc 10 mg due to leg swelling and low heart rate Start losartan 50 mg daily for BP reduction Continue lasix but reduce to 20 mg daily  Obtain chest x-ray at [facility], first floor ECHO schedule  F/u in 2 weeks for HTN/swelling with me   Dr. Adrian Blackwater
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
## 2016-06-01 ENCOUNTER — Encounter: Payer: Self-pay | Admitting: Family Medicine

## 2016-06-01 NOTE — Assessment & Plan Note (Signed)
A: improving A1c, goals is < 7 P: Increase lantus to 40 U daily

## 2016-06-01 NOTE — Assessment & Plan Note (Signed)
A: BP is slightly low with lasix 40 mg daily there is still trace edema Plan: Continue lasix but decrease dose to 20 mg daily D/c norvasc due to swelling Start losartan 50 mg daily CXR and cardiac ECHO to evaluate for CHF

## 2016-06-01 NOTE — Assessment & Plan Note (Signed)
A: improving A1c P: Continue tramadol for pain 100 mg BID

## 2016-06-02 MED FILL — METFORMIN HCL ER 500 MG TAB: 500 | 30 days supply | Qty: 60 | Fill #3

## 2016-06-04 ENCOUNTER — Telehealth: Payer: Self-pay

## 2016-06-04 ENCOUNTER — Telehealth: Payer: Self-pay | Admitting: Family Medicine

## 2016-06-04 NOTE — Telephone Encounter (Signed)
Pt was called and a VM was left informing pt to return phone call for lab results. 

## 2016-06-04 NOTE — Telephone Encounter (Signed)
Patient returned nurse' call regarding his lab results. Pt gave the verbal ok to leave a detailed message if he doesn't answer. Please follow up.  Thank you.

## 2016-06-13 ENCOUNTER — Ambulatory Visit: Payer: Self-pay | Attending: Family Medicine | Admitting: Family Medicine

## 2016-06-13 ENCOUNTER — Encounter: Payer: Self-pay | Admitting: Family Medicine

## 2016-06-13 VITALS — BP 132/73 | HR 62 | Temp 97.6°F | Wt 237.0 lb

## 2016-06-13 DIAGNOSIS — I10 Essential (primary) hypertension: Secondary | ICD-10-CM | POA: Insufficient documentation

## 2016-06-13 DIAGNOSIS — E1142 Type 2 diabetes mellitus with diabetic polyneuropathy: Secondary | ICD-10-CM

## 2016-06-13 LAB — GLUCOSE, POCT (MANUAL RESULT ENTRY): POC Glucose: 180 mg/dl — AB (ref 70–99)

## 2016-06-13 NOTE — Patient Instructions (Addendum)
Lamonta was seen today for hypertension.  Diagnoses and all orders for this visit:  Diabetic polyneuropathy associated with type 2 diabetes mellitus ([place]) -     POCT glucose (manual entry)  Essential hypertension -     BMP8+EGFR   Your blood pressure is normal Continue losartan 50 mg daily Lasix 20 mg if needed for weight gain over 3 lbs  Taper off tramadol from 100 mg twice daily to 50 mg twice daily for one week, then 50 mg daily for one week then stop. Since imodium is working well you may continue just watch out for severe constipation, dizziness and oversedation  F/u in 3 months for HTN and diabetes   Dr. Adrian Blackwater

## 2016-06-13 NOTE — Assessment & Plan Note (Signed)
Taper off tramadol Continue imodium (peripheral delta opiod receptor antagonist) for thermal hyperalgesia (burning pain)
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
## 2016-06-13 NOTE — Assessment & Plan Note (Signed)
A: normotensive Med: compliant P: Continue losartan 50 mg daily Lasix 20 mg prn weight gain > 3 lbs  ECHO pending

## 2016-06-14 LAB — BMP8+EGFR
BUN/Creatinine Ratio: 17 (ref 9–20)
BUN: 19 mg/dL (ref 6–24)
CO2: 22 mmol/L (ref 18–29)
Calcium: 9.3 mg/dL (ref 8.7–10.2)
Chloride: 101 mmol/L (ref 96–106)
Creatinine, Ser: 1.1 mg/dL (ref 0.76–1.27)
GFR calc Af Amer: 88 mL/min/{1.73_m2} (ref >59)
GFR calc non Af Amer: 76 mL/min/{1.73_m2} (ref >59)
Glucose: 159 mg/dL — ABNORMAL HIGH (ref 65–99)
Potassium: 3.7 mmol/L (ref 3.5–5.2)
Sodium: 139 mmol/L (ref 134–144)

## 2016-06-18 ENCOUNTER — Telehealth: Payer: Self-pay

## 2016-06-18 NOTE — Telephone Encounter (Signed)
Pt was called and informed of lab results. 

## 2016-06-20 MED FILL — ATORVASTATIN 40 MG TABLET: 40 | 30 days supply | Qty: 30 | Fill #1

## 2016-06-23 ENCOUNTER — Ambulatory Visit (INDEPENDENT_AMBULATORY_CARE_PROVIDER_SITE_OTHER): Payer: No Typology Code available for payment source | Admitting: Podiatry

## 2016-06-23 ENCOUNTER — Encounter: Payer: Self-pay | Admitting: Podiatry

## 2016-06-23 DIAGNOSIS — E0842 Diabetes mellitus due to underlying condition with diabetic polyneuropathy: Secondary | ICD-10-CM

## 2016-06-23 DIAGNOSIS — L97522 Non-pressure chronic ulcer of other part of left foot with fat layer exposed: Secondary | ICD-10-CM

## 2016-06-23 DIAGNOSIS — I70245 Atherosclerosis of native arteries of left leg with ulceration of other part of foot: Secondary | ICD-10-CM

## 2016-06-23 MED ORDER — GENTAMICIN SULFATE 0.1 % EX CREA: 1.0000 "application " | TOPICAL_CREAM | Freq: Three times a day (TID) | CUTANEOUS | 0 refills | Status: DC

## 2016-06-23 MED ORDER — DOXYCYCLINE HYCLATE 100 MG PO TABS: 100.0000 mg | ORAL_TABLET | Freq: Two times a day (BID) | ORAL | 0 refills | Status: DC

## 2016-06-23 MED FILL — GENTAMICIN 0.1% CREAM: 0.1 | 20 days supply | Qty: 15 | Fill #0

## 2016-06-26 ENCOUNTER — Ambulatory Visit: Payer: Self-pay | Attending: Family Medicine

## 2016-07-02 NOTE — Progress Notes (Signed)
   Subjective:  Patient with a history of diabetes mellitus presents today for follow up evaluation of an ulceration to the right great toe. Pt states the area has improved significantly. He now complains of ulcerations to the 3rd and 4th toes of the left foot that appeared about 5 days ago. He reports associated redness of these toes. He has also noticed some bleeding from the area on his sock. Patient presents today for further treatment and evaluation   Objective/Physical Exam General: The patient is alert and oriented x3 in no acute distress.  Dermatology:  Wound #1 noted to the plantar aspect of the 3rd toe of the left foot measuring 1.0 x 1.0 x 0.1 cm (LxWxD).   To the noted ulceration(s), there is no eschar. There is a moderate amount of slough, fibrin, and necrotic tissue noted. Granulation tissue and wound base is red. There is a minimal amount of serosanguineous drainage noted. There is no exposed bone muscle-tendon ligament or joint. There is no malodor. Periwound integrity is intact. Skin is warm, dry and supple bilateral lower extremities.  Vascular: Palpable pedal pulses bilaterally. No edema or erythema noted. Capillary refill within normal limits.  Neurological: Epicritic and protective threshold absent bilaterally.   Musculoskeletal Exam: Range of motion within normal limits to all pedal and ankle joints bilateral. Muscle strength 5/5 in all groups bilateral.   Assessment: #1 Ulceration of the plantar aspect of the third toe of the left foot secondary to diabetes mellitus #2 diabetes mellitus w/ peripheral neuropathy   Plan of Care:  #1 Patient was evaluated. #2 medically necessary excisional debridement including subcutaneous tissue was performed using a tissue nipper and a chisel blade. Excisional debridement of all the necrotic nonviable tissue down to healthy bleeding viable tissue was performed with post-debridement measurements same as pre-. #3 the wound was  cleansed, antibiotic ointment and a Band-Aid applied. #4 prescription for gentamycin cream given to patient. #5 Prescription for doxycycline 100 mg #14 given to patient #6 Return to clinic in 2 weeks.   Edrick Kins, DPM [facility]  Dr. Edrick Kins, [place]                                        [place], [place] [address]                Office [phone]  Fax [phone]

## 2016-07-07 ENCOUNTER — Ambulatory Visit (INDEPENDENT_AMBULATORY_CARE_PROVIDER_SITE_OTHER): Payer: Self-pay | Admitting: Podiatry

## 2016-07-07 DIAGNOSIS — E0842 Diabetes mellitus due to underlying condition with diabetic polyneuropathy: Secondary | ICD-10-CM

## 2016-07-10 NOTE — Progress Notes (Signed)
   Subjective:  Patient with a history of diabetes mellitus presents today for follow up evaluation of an ulceration to the right great toe. Patient states the ulceration is doing better. He reports thickening and discoloration of his toenails.   Objective/Physical Exam General: The patient is alert and oriented x3 in no acute distress.  Dermatology: Ulceration healed. No open lesions. Complete reepithelialization has occurred.  Vascular: Palpable pedal pulses bilaterally. No edema or erythema noted. Capillary refill within normal limits.  Neurological: Epicritic and protective threshold absent bilaterally.   Musculoskeletal Exam: Range of motion within normal limits to all pedal and ankle joints bilateral. Muscle strength 5/5 in all groups bilateral.   Assessment: #1 Ulceration of the plantar aspect of the third toe of the left foot secondary to diabetes mellitus-healed   Plan of Care:  #1 Patient was evaluated. #2 recommended good shoe gear. #3 Daily foot cream. #4 return to clinic when necessary.   Edrick Kins, DPM [facility]  Dr. Edrick Kins, [place]                                        [place], [place] [address]                Office [phone]  Fax [phone]

## 2016-07-17 ENCOUNTER — Ambulatory Visit (HOSPITAL_COMMUNITY)
Admission: RE | Admit: 2016-07-17 | Discharge: 2016-07-17 | Disposition: A | Discharge status: Home / Self Care | Payer: Self-pay | Source: Ambulatory Visit | Attending: Family Medicine | Admitting: Family Medicine

## 2016-07-17 DIAGNOSIS — E119 Type 2 diabetes mellitus without complications: Secondary | ICD-10-CM | POA: Insufficient documentation

## 2016-07-17 DIAGNOSIS — E785 Hyperlipidemia, unspecified: Secondary | ICD-10-CM | POA: Insufficient documentation

## 2016-07-17 DIAGNOSIS — I119 Hypertensive heart disease without heart failure: Secondary | ICD-10-CM | POA: Insufficient documentation

## 2016-07-17 DIAGNOSIS — I10 Essential (primary) hypertension: Secondary | ICD-10-CM

## 2016-07-17 DIAGNOSIS — I77810 Thoracic aortic ectasia: Secondary | ICD-10-CM | POA: Insufficient documentation

## 2016-07-17 NOTE — Progress Notes (Signed)
  Echocardiogram 2D Echocardiogram has been performed.  Aniela Caniglia L Androw 07/17/2016, 8:40 AM

## 2016-07-18 ENCOUNTER — Telehealth: Payer: Self-pay

## 2016-07-18 NOTE — Telephone Encounter (Signed)
Pt was called and a VM was left informing pt to return phone call for lab results. If pt returns phone call please inform pt:   The ECHO reveals normal heart pumping ,EF  The is reduced relaxation of the left ventricle grade 1  This is due to hypertension  Please continue to work on BP control.   Also, Dr. Adrian Blackwater looked into the imodium that you are taking for neuropathic pain and the risk of high dose imodium is arrhythmia (annormal heart rhythm)  You are advised to not exceed the recommended daily amount

## 2016-07-22 ENCOUNTER — Other Ambulatory Visit: Payer: Self-pay | Admitting: *Deleted

## 2016-07-22 DIAGNOSIS — Z794 Long term (current) use of insulin: Secondary | ICD-10-CM

## 2016-07-22 DIAGNOSIS — E1142 Type 2 diabetes mellitus with diabetic polyneuropathy: Secondary | ICD-10-CM

## 2016-07-22 MED ORDER — INSULIN GLARGINE 100 UNIT/ML SOLOSTAR PEN: 40.0000 [IU] | PEN_INJECTOR | Freq: Every day | SUBCUTANEOUS | 3 refills | Status: DC

## 2016-07-22 NOTE — Telephone Encounter (Signed)
PRINTED FOR PASS PROGRAM 

## 2016-07-23 ENCOUNTER — Encounter: Payer: Self-pay | Admitting: Podiatry

## 2016-07-23 ENCOUNTER — Ambulatory Visit (INDEPENDENT_AMBULATORY_CARE_PROVIDER_SITE_OTHER): Payer: Self-pay | Admitting: Podiatry

## 2016-07-23 DIAGNOSIS — B351 Tinea unguium: Secondary | ICD-10-CM

## 2016-07-23 DIAGNOSIS — E0842 Diabetes mellitus due to underlying condition with diabetic polyneuropathy: Secondary | ICD-10-CM

## 2016-07-23 DIAGNOSIS — L97522 Non-pressure chronic ulcer of other part of left foot with fat layer exposed: Secondary | ICD-10-CM

## 2016-07-23 NOTE — Progress Notes (Signed)
Complaint:  Visit Type: Patient returns to my office for continued preventative foot care services. Complaint: Patient states" his nails have grown thick and long are painful walking and wearing his shoes.  The big toe right  been on his big toe,  has healed since there is no drainage and no bleeding from the ulcer. This ulcer is covered by a thickened red, callus, which is painful when he walks.  He has been under treatment of ulcer under third toe left foot which has healed.  Podiatric Exam: Vascular: dorsalis pedis and posterior tibial pulses are palpable bilateral. Capillary return is immediate. Temperature gradient is WNL. Skin turgor WNL  Sensorium: Diminished Semmes Weinstein monofilament test. Normal tactile sensation bilaterally. Nail Exam: Pt has thick disfigured discolored nails with subungual debris noted bilateral entire nail hallux through fifth toenails Ulcer Exam: There is significant hemorrhagic callus noted on the medial aspect of the right hallux. No redness or swelling or drainage or bleeding noted Orthopedic Exam: Muscle tone and strength are WNL. No limitations in general ROM. No crepitus or effusions noted. Foot type and digits show no abnormalities. Bony prominences are unremarkable. Skin: No Porokeratosis. No infection or ulcers.    Diagnosis:  Onychomycosis, , Pain in right toe, pain in left toes,   Treatment & Plan Procedures and Treatment: Consent by patient was obtained for treatment procedures. The patient understood the discussion of treatment and procedures well. All questions were answered thoroughly reviewed. Debridement of mycotic and hypertrophic toenails, 1 through 5 bilateral and clearing of subungual debris. No ulceration, no infection noted.  Return Visit-Office Procedure: Patient instructed to return to the office for a follow up visit  10  weeks.for continued evaluation and treatment.    Gardiner Barefoot DPM

## 2016-07-30 ENCOUNTER — Encounter: Payer: Self-pay | Admitting: Pharmacist

## 2016-08-15 MED FILL — ?FENOFIBRATE 145 MG TABLET: 145 | 30 days supply | Qty: 30 | Fill #2

## 2016-08-15 MED FILL — ?ATORVASTATIN 40MG TABLET: 40 | 30 days supply | Qty: 30 | Fill #2

## 2016-08-15 MED FILL — LOSARTAN POTASSIUM 50 MG TA: 50 | 30 days supply | Qty: 30 | Fill #1

## 2016-09-04 MED FILL — METFORMIN HCL ER 500 MG TAB: 500 | 30 days supply | Qty: 60 | Fill #4

## 2016-09-11 ENCOUNTER — Encounter: Payer: Self-pay | Admitting: Internal Medicine

## 2016-09-11 ENCOUNTER — Ambulatory Visit: Payer: Self-pay | Attending: Internal Medicine | Admitting: Internal Medicine

## 2016-09-11 VITALS — BP 140/80 | HR 54 | Temp 98.7°F | Resp 16 | Wt 243.0 lb

## 2016-09-11 DIAGNOSIS — F319 Bipolar disorder, unspecified: Secondary | ICD-10-CM | POA: Insufficient documentation

## 2016-09-11 DIAGNOSIS — Z8659 Personal history of other mental and behavioral disorders: Secondary | ICD-10-CM

## 2016-09-11 DIAGNOSIS — R600 Localized edema: Secondary | ICD-10-CM

## 2016-09-11 DIAGNOSIS — Z9889 Other specified postprocedural states: Secondary | ICD-10-CM | POA: Insufficient documentation

## 2016-09-11 DIAGNOSIS — Z23 Encounter for immunization: Secondary | ICD-10-CM | POA: Insufficient documentation

## 2016-09-11 DIAGNOSIS — I10 Essential (primary) hypertension: Secondary | ICD-10-CM | POA: Insufficient documentation

## 2016-09-11 DIAGNOSIS — Z794 Long term (current) use of insulin: Secondary | ICD-10-CM | POA: Insufficient documentation

## 2016-09-11 DIAGNOSIS — M542 Cervicalgia: Secondary | ICD-10-CM | POA: Insufficient documentation

## 2016-09-11 DIAGNOSIS — E118 Type 2 diabetes mellitus with unspecified complications: Secondary | ICD-10-CM

## 2016-09-11 DIAGNOSIS — M1990 Unspecified osteoarthritis, unspecified site: Secondary | ICD-10-CM | POA: Insufficient documentation

## 2016-09-11 DIAGNOSIS — M25511 Pain in right shoulder: Secondary | ICD-10-CM | POA: Insufficient documentation

## 2016-09-11 DIAGNOSIS — I517 Cardiomegaly: Secondary | ICD-10-CM | POA: Insufficient documentation

## 2016-09-11 DIAGNOSIS — M7989 Other specified soft tissue disorders: Secondary | ICD-10-CM | POA: Insufficient documentation

## 2016-09-11 DIAGNOSIS — E785 Hyperlipidemia, unspecified: Secondary | ICD-10-CM | POA: Insufficient documentation

## 2016-09-11 DIAGNOSIS — M545 Low back pain: Secondary | ICD-10-CM | POA: Insufficient documentation

## 2016-09-11 DIAGNOSIS — K219 Gastro-esophageal reflux disease without esophagitis: Secondary | ICD-10-CM | POA: Insufficient documentation

## 2016-09-11 DIAGNOSIS — E1142 Type 2 diabetes mellitus with diabetic polyneuropathy: Secondary | ICD-10-CM | POA: Insufficient documentation

## 2016-09-11 LAB — GLUCOSE, POCT (MANUAL RESULT ENTRY): POC Glucose: 213 mg/dl — AB (ref 70–99)

## 2016-09-11 LAB — POCT GLYCOSYLATED HEMOGLOBIN (HGB A1C): Hemoglobin A1C: 7
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
## 2016-09-11 NOTE — Patient Instructions (Addendum)
Stop taking Imodium. Use the Furosemide as needed for swelling in legs  Influenza Virus Vaccine injection (Fluarix) What is this medicine? INFLUENZA VIRUS VACCINE (in floo EN zuh VAHY ruhs vak SEEN) helps to reduce the risk of getting influenza also known as the flu. This medicine may be used for other purposes; ask your health care provider or pharmacist if you have questions. COMMON BRAND NAME(S): Fluarix, Fluzone What should I tell my health care provider before I take this medicine? They need to know if you have any of these conditions: -bleeding disorder like hemophilia -fever or infection -Guillain-Barre syndrome or other neurological problems -immune system problems -infection with the human immunodeficiency virus (HIV) or AIDS -low blood platelet counts -multiple sclerosis -an unusual or allergic reaction to influenza virus vaccine, eggs, chicken proteins, latex, gentamicin, other medicines, foods, dyes or preservatives -pregnant or trying to get pregnant -breast-feeding How should I use this medicine? This vaccine is for injection into a muscle. It is given by a health care professional. A copy of Vaccine Information Statements will be given before each vaccination. Read this sheet carefully each time. The sheet may change frequently. Talk to your pediatrician regarding the use of this medicine in children. Special care may be needed. Overdosage: If you think you have taken too much of this medicine contact a poison control center or emergency room at once. NOTE: This medicine is only for you. Do not share this medicine with others. What if I miss a dose? This does not apply. What may interact with this medicine? -chemotherapy or radiation therapy -medicines that lower your immune system like etanercept, anakinra, infliximab, and adalimumab -medicines that treat or prevent blood clots like warfarin -phenytoin -steroid medicines like prednisone or  cortisone -theophylline -vaccines This list may not describe all possible interactions. Give your health care provider a list of all the medicines, herbs, non-prescription drugs, or dietary supplements you use. Also tell them if you smoke, drink alcohol, or use illegal drugs. Some items may interact with your medicine. What should I watch for while using this medicine? Report any side effects that do not go away within 3 days to your doctor or health care professional. Call your health care provider if any unusual symptoms occur within 6 weeks of receiving this vaccine. You may still catch the flu, but the illness is not usually as bad. You cannot get the flu from the vaccine. The vaccine will not protect against colds or other illnesses that may cause fever. The vaccine is needed every year. What side effects may I notice from receiving this medicine? Side effects that you should report to your doctor or health care professional as soon as possible: -allergic reactions like skin rash, itching or hives, swelling of the face, lips, or tongue Side effects that usually do not require medical attention (report to your doctor or health care professional if they continue or are bothersome): -fever -headache -muscle aches and pains -pain, tenderness, redness, or swelling at site where injected -weak or tired This list may not describe all possible side effects. Call your doctor for medical advice about side effects. You may report side effects to FDA at 1-800-FDA-1088. Where should I keep my medicine? This vaccine is only given in a clinic, pharmacy, doctor's office, or other health care setting and will not be stored at home. NOTE: This sheet is a summary. It may not cover all possible information. If you have questions about this medicine, talk to your doctor, pharmacist, or health  care provider.  2018 Elsevier/Gold Standard (2007-07-28 09:30:40)

## 2016-09-24 ENCOUNTER — Encounter: Payer: Self-pay | Attending: Physical Medicine & Rehabilitation | Admitting: Physical Medicine & Rehabilitation

## 2016-09-24 ENCOUNTER — Ambulatory Visit: Payer: Self-pay | Admitting: Podiatry

## 2016-09-24 ENCOUNTER — Encounter: Payer: Self-pay | Admitting: Podiatry

## 2016-09-24 ENCOUNTER — Encounter: Payer: Self-pay | Admitting: Physical Medicine & Rehabilitation

## 2016-09-24 VITALS — BP 169/94 | HR 57

## 2016-09-24 DIAGNOSIS — Z794 Long term (current) use of insulin: Secondary | ICD-10-CM

## 2016-09-24 DIAGNOSIS — E1140 Type 2 diabetes mellitus with diabetic neuropathy, unspecified: Secondary | ICD-10-CM | POA: Insufficient documentation

## 2016-09-24 DIAGNOSIS — R42 Dizziness and giddiness: Secondary | ICD-10-CM | POA: Insufficient documentation

## 2016-09-24 DIAGNOSIS — I10 Essential (primary) hypertension: Secondary | ICD-10-CM | POA: Insufficient documentation

## 2016-09-24 DIAGNOSIS — G4730 Sleep apnea, unspecified: Secondary | ICD-10-CM | POA: Insufficient documentation

## 2016-09-24 DIAGNOSIS — F419 Anxiety disorder, unspecified: Secondary | ICD-10-CM | POA: Insufficient documentation

## 2016-09-24 DIAGNOSIS — E1142 Type 2 diabetes mellitus with diabetic polyneuropathy: Secondary | ICD-10-CM

## 2016-09-24 DIAGNOSIS — R531 Weakness: Secondary | ICD-10-CM | POA: Insufficient documentation

## 2016-09-24 DIAGNOSIS — B351 Tinea unguium: Secondary | ICD-10-CM

## 2016-09-24 DIAGNOSIS — F319 Bipolar disorder, unspecified: Secondary | ICD-10-CM | POA: Insufficient documentation

## 2016-09-24 MED ORDER — DULOXETINE HCL 20 MG PO CPEP: 20.0000 mg | ORAL_CAPSULE | Freq: Every day | ORAL | 2 refills | Status: DC

## 2016-09-24 MED FILL — DULoxetine HCL 20 MG CPEP: 20 | 30 days supply | Qty: 30 | Fill #0

## 2016-09-24 NOTE — Patient Instructions (Signed)
CONSIDER ADDING 5-10 UNITS OF LANTUS IN THE MORNING TO BETTER CONTROL YOUR PM SUGARS   PLEASE FEEL FREE TO CALL OUR OFFICE WITH ANY PROBLEMS OR QUESTIONS ([phone])

## 2016-09-24 NOTE — Progress Notes (Signed)
Subjective:    Patient ID: [name] [name], male    DOB: 10-03-1960, 56 y.o.   MRN: [id number]  HPI   [name] [name] is here in follow up of painful peripheral neuropathy. I last saw him in September of last year. He received his disability finally this summer.   He is on lantus and glucophage xr for glucose control.   He is not using gabapentin due to adverse effect. He stopped using the pamelor due to ineffectiveness. His feet bother him the most at night and on days when he's more active.     Pain Inventory Average Pain 7 Pain Right Now 5 My pain is sharp, burning, stabbing and tingling  In the last 24 hours, has pain interfered with the following? General activity 10 Relation with others 7 Enjoyment of life 10 What TIME of day is your pain at its worst? all Sleep (in general) Poor  Pain is worse with: walking, bending and standing Pain improves with: medication Relief from Meds: na  Mobility use a cane ability to climb steps?  no do you drive?  yes  Function disabled: date disabled 2014  Neuro/Psych weakness numbness tingling trouble walking dizziness depression anxiety  Prior Studies Any changes since last visit?  no  Physicians involved in your care Any changes since last visit?  no   Family History  Problem Relation Age of Onset  . Diabetes Mother   . Hyperlipidemia Mother   . Heart disease Father    Social History   Social History  . Marital status: Single    Spouse name: N/A  . Number of children: N/A  . Years of education: N/A   Social History Main Topics  . Smoking status: Never Smoker  . Smokeless tobacco: Never Used  . Alcohol use No  . Drug use: No  . Sexual activity: Not on file   Other Topics Concern  . Not on file   Social History Narrative  . No narrative on file   Past Surgical History:  Procedure Laterality Date  . SHOULDER ARTHROSCOPY WITH ROTATOR CUFF REPAIR Right 2009  . SPINE SURGERY  2010   Past Medical  History:  Diagnosis Date  . Depressed bipolar disorder ([place])   . Diabetes mellitus without complication ([place])   . Diabetic neuropathy ([place])   . Herpes zoster 12/05/2008   Qualifier: Diagnosis of  By: Ronnald Ramp MD, Arvid Right.   . Hypertension   . Neuropathy in diabetes ([place])   . Sleep apnea    There were no vitals taken for this visit.  Opioid Risk Score:   Fall Risk Score:  `1  Depression screen PHQ 2/9  Depression screen [facility] 2/9 09/11/2016 06/13/2016 04/15/2016 02/05/2016 10/03/2015 09/14/2015 08/13/2015  Decreased Interest 1 1 1 1 1 1  0  Down, Depressed, Hopeless 1 1 1 1 1 1  0  PHQ - 2 Score 2 2 2 2 2 2  0  Altered sleeping 1 0 1 1 1 1  -  Tired, decreased energy 1 1 1 1 1 1  -  Change in appetite 0 0 0 1 0 0 -  Feeling bad or failure about yourself  1 1 1 1 1  0 -  Trouble concentrating 1 1 1 1  0 0 -  Moving slowly or fidgety/restless 0 0 0 1 0 0 -  Suicidal thoughts 0 0 0 0 0 0 -  PHQ-9 Score 6 5 6 8 5 4  -  Difficult doing work/chores - - - - Very  difficult - -  Some recent data might be hidden     Review of Systems  Constitutional: Positive for unexpected weight change.  HENT: Negative.   Eyes: Negative.   Respiratory: Positive for apnea.   Cardiovascular: Negative.   Gastrointestinal: Negative.   Endocrine: Negative.   Genitourinary: Negative.   Musculoskeletal: Positive for joint swelling.  Skin: Negative.   Allergic/Immunologic: Negative.   Neurological: Negative.   Hematological: Negative.   Psychiatric/Behavioral: Negative.   All other systems reviewed and are negative.      Objective:   Physical Exam  General: Alert and oriented x 3, No apparent distress HEENT: Head is normocephalic, atraumatic, PERRLA, EOMI, sclera anicteric, oral mucosa pink and moist, dentition intact, ext ear canals clear,  Neck: Supple without JVD or lymphadenopathy Heart: Reg rate and rhythm. No murmurs rubs or gallops Chest: CTA bilaterally without wheezes, rales, or rhonchi; no  distress Abdomen: Soft, non-tender, non-distended, bowel sounds positive. Extremities: No clubbing, cyanosis, or edema. Pulses are 2+ Skin: skin clean Neuro: cognitively intact. Strength 5/5. Decreased LT/pp in both feet.  Musculoskeletal: Full ROM, No pain with AROM or PROM in the neck, trunk, or extremities. Posture appropriate Psych: Pt's affect is appropriate. Pt is cooperative         Assessment & Plan:  1. Diabetes II with painful peripheral neuropathy 2. Bipolar disorder followed by [facility] 3. Chronic foot wounds/calluses--resolved   Plan: 1. Begin trial of cymbalta 20mg  daily.   2. Consider TENS trial for feet, conductive garments 3. Still can consider another trial of pamelor 4. Continued tight management of diabetes is important 5. Maintain skin care   15 minutes of face to face patient care time were spent during this visit. All questions were encouraged and answered. Follow up in a month.

## 2016-09-24 NOTE — Progress Notes (Signed)
Complaint:  Visit Type: Patient returns to my office for continued preventative foot care services. Complaint: Patient states" his nails have grown thick and long are painful walking and wearing his shoes.  Patient is diabetic with neuropathy.  Patient presents for preventative foot care services..  Podiatric Exam: Vascular: dorsalis pedis and posterior tibial pulses are palpable bilateral. Capillary return is immediate. Temperature gradient is WNL. Skin turgor WNL  Sensorium: Diminished Semmes Weinstein monofilament test. Normal tactile sensation bilaterally. Nail Exam: Pt has thick disfigured discolored nails with subungual debris noted bilateral entire nail hallux through fifth toenails Ulcer Exam: There is significant hemorrhagic callus noted on the medial aspect of the right hallux. No redness or swelling or drainage or bleeding noted Orthopedic Exam: Muscle tone and strength are WNL. No limitations in general ROM. No crepitus or effusions noted. Foot type and digits show no abnormalities. Bony prominences are unremarkable. Skin: No Porokeratosis. No infection or ulcers.    Diagnosis:  Onychomycosis, , Pain in right toe, pain in left toes,   Treatment & Plan Procedures and Treatment: Consent by patient was obtained for treatment procedures. The patient understood the discussion of treatment and procedures well. All questions were answered thoroughly reviewed. Debridement of mycotic and hypertrophic toenails, 1 through 5 bilateral and clearing of subungual debris. No ulceration, no infection noted.   Return Visit-Office Procedure: Patient instructed to return to the office for a follow up visit  10  weeks.for continued evaluation and treatment.    Aikeem Lilley DPM 

## 2016-09-30 MED FILL — ?FENOFIBRATE 145 MG TABLET: 145 | 30 days supply | Qty: 30 | Fill #3

## 2016-09-30 MED FILL — METFORMIN HCL ER 500 MG TAB: 500 | 30 days supply | Qty: 60 | Fill #5

## 2016-09-30 MED FILL — ?ATORVASTATIN 40MG TABLET: 40 | 30 days supply | Qty: 30 | Fill #3

## 2016-09-30 MED FILL — LOSARTAN POTASSIUM 50 MG TA: 50 | 30 days supply | Qty: 30 | Fill #2

## 2016-10-20 ENCOUNTER — Other Ambulatory Visit: Payer: Self-pay

## 2016-10-20 DIAGNOSIS — E1142 Type 2 diabetes mellitus with diabetic polyneuropathy: Secondary | ICD-10-CM

## 2016-10-20 DIAGNOSIS — Z794 Long term (current) use of insulin: Secondary | ICD-10-CM

## 2016-10-20 MED ORDER — INSULIN GLARGINE 100 UNIT/ML SOLOSTAR PEN: 40.0000 [IU] | PEN_INJECTOR | Freq: Every day | SUBCUTANEOUS | 3 refills | Status: DC

## 2016-11-04 ENCOUNTER — Encounter: Payer: Medicare Other | Attending: Physical Medicine & Rehabilitation | Admitting: Physical Medicine & Rehabilitation

## 2016-11-04 ENCOUNTER — Encounter: Payer: Self-pay | Admitting: Physical Medicine & Rehabilitation

## 2016-11-04 VITALS — BP 166/89 | HR 60

## 2016-11-04 DIAGNOSIS — R42 Dizziness and giddiness: Secondary | ICD-10-CM | POA: Diagnosis not present

## 2016-11-04 DIAGNOSIS — E1140 Type 2 diabetes mellitus with diabetic neuropathy, unspecified: Secondary | ICD-10-CM | POA: Insufficient documentation

## 2016-11-04 DIAGNOSIS — G4730 Sleep apnea, unspecified: Secondary | ICD-10-CM | POA: Diagnosis not present

## 2016-11-04 DIAGNOSIS — I10 Essential (primary) hypertension: Secondary | ICD-10-CM | POA: Diagnosis not present

## 2016-11-04 DIAGNOSIS — E1142 Type 2 diabetes mellitus with diabetic polyneuropathy: Secondary | ICD-10-CM

## 2016-11-04 DIAGNOSIS — R531 Weakness: Secondary | ICD-10-CM | POA: Insufficient documentation

## 2016-11-04 DIAGNOSIS — F319 Bipolar disorder, unspecified: Secondary | ICD-10-CM | POA: Insufficient documentation

## 2016-11-04 DIAGNOSIS — F419 Anxiety disorder, unspecified: Secondary | ICD-10-CM | POA: Insufficient documentation

## 2016-11-04 DIAGNOSIS — G629 Polyneuropathy, unspecified: Secondary | ICD-10-CM | POA: Diagnosis present

## 2016-11-04 MED ORDER — DULOXETINE HCL 30 MG PO CPEP: 30.0000 mg | ORAL_CAPSULE | Freq: Every day | ORAL | 2 refills | Status: DC

## 2016-11-04 MED ORDER — NORTRIPTYLINE HCL 10 MG PO CAPS: 10.0000 mg | ORAL_CAPSULE | Freq: Every day | ORAL | 2 refills | Status: DC

## 2016-11-04 MED FILL — NORTRIPTYLINE HCL 10 MG CAP: 10 | 30 days supply | Qty: 60 | Fill #0

## 2016-11-04 MED FILL — ?DULOXETINE HCL DR 30 MG CA: 30 MG | 30 days supply | Qty: 30 | Fill #0

## 2016-11-04 NOTE — Patient Instructions (Addendum)
PLEASE FEEL FREE TO CALL OUR OFFICE WITH ANY PROBLEMS OR QUESTIONS ([phone])    TRY NORTRIPTYLINE 10MG  AT NIGHT FOR ONE WEEK THEN IF IT'S NOT EFFECTIVE, YOU MAY INCREASE TO 20MG 

## 2016-11-04 NOTE — Progress Notes (Signed)
Subjective:    Patient ID: [name] [name], male    DOB: January 24, 1960, 56 y.o.   MRN: [id number]  HPI   Mr. Blanda is here in follow up of his chronic foot pain. He brought with him today his past history of pain mgt. He has been treated for cervical pain. I reviewed his cervical MRI from earlier this year which reveals multiple level spondylosis with DDD and foraminal stenosis.   As far as his DPN pain is concerned, he hasn't noticed a big difference with the cymbalta but the colder weather hasn't helped. He describes no SE with cymbalta however.    Pain Inventory Average Pain 7 Pain Right Now 8 My pain is constant, sharp, burning, stabbing and aching  In the last 24 hours, has pain interfered with the following? General activity 9 Relation with others 9 Enjoyment of life 10 What TIME of day is your pain at its worst? all Sleep (in general) Poor  Pain is worse with: walking and standing Pain improves with: . Relief from Meds: 0  Mobility walk without assistance ability to climb steps?  no do you drive?  yes  Function disabled: date disabled 2014  Neuro/Psych weakness numbness trouble walking dizziness depression  Prior Studies Any changes since last visit?  no  Physicians involved in your care Any changes since last visit?  no   Family History  Problem Relation Age of Onset  . Diabetes Mother   . Hyperlipidemia Mother   . Heart disease Father    Social History   Social History  . Marital status: Single    Spouse name: N/A  . Number of children: N/A  . Years of education: N/A   Social History Main Topics  . Smoking status: Never Smoker  . Smokeless tobacco: Never Used  . Alcohol use No  . Drug use: No  . Sexual activity: Not on file   Other Topics Concern  . Not on file   Social History Narrative  . No narrative on file   Past Surgical History:  Procedure Laterality Date  . SHOULDER ARTHROSCOPY WITH ROTATOR CUFF REPAIR Right 2009  .  SPINE SURGERY  2010   Past Medical History:  Diagnosis Date  . Depressed bipolar disorder ([place])   . Diabetes mellitus without complication ([place])   . Diabetic neuropathy ([place])   . Herpes zoster 12/05/2008   Qualifier: Diagnosis of  By: Ronnald Ramp MD, Arvid Right.   . Hypertension   . Neuropathy in diabetes ([place])   . Sleep apnea    There were no vitals taken for this visit.  Opioid Risk Score:   Fall Risk Score:  `1  Depression screen PHQ 2/9  Depression screen [facility] 2/9 09/11/2016 06/13/2016 04/15/2016 02/05/2016 10/03/2015 09/14/2015 08/13/2015  Decreased Interest 1 1 1 1 1 1  0  Down, Depressed, Hopeless 1 1 1 1 1 1  0  PHQ - 2 Score 2 2 2 2 2 2  0  Altered sleeping 1 0 1 1 1 1  -  Tired, decreased energy 1 1 1 1 1 1  -  Change in appetite 0 0 0 1 0 0 -  Feeling bad or failure about yourself  1 1 1 1 1  0 -  Trouble concentrating 1 1 1 1  0 0 -  Moving slowly or fidgety/restless 0 0 0 1 0 0 -  Suicidal thoughts 0 0 0 0 0 0 -  PHQ-9 Score 6 5 6 8 5 4  -  Difficult doing work/chores - - - -  Very difficult - -  Some recent data might be hidden     Review of Systems  Constitutional: Negative.   HENT: Negative.   Eyes: Negative.   Respiratory: Positive for shortness of breath.   Cardiovascular: Negative.   Gastrointestinal: Negative.   Endocrine: Negative.   Genitourinary: Negative.   Musculoskeletal: Positive for joint swelling.  Skin: Negative.   Allergic/Immunologic: Negative.   Neurological: Negative.   Hematological: Negative.   Psychiatric/Behavioral: Negative.   All other systems reviewed and are negative.      Objective:   Physical Exam   General: Alert and oriented x 3, No apparent distress HEENT:Head is normocephalic, atraumatic, PERRLA, EOMI, sclera anicteric, oral mucosa pink and moist, dentition intact, ext ear canals clear,  Neck:Supple without JVD or lymphadenopathy Heart:RRR without murmur. No JVD  Chest:CTA Bilaterally without wheezes or rales. Normal effort    Abdomen:BS +, non-tender, non-distended  Extremities:No clubbing, cyanosis, or edema. Pulses are 2+ Skin:skin clean Neuro:cognitively intact. Strength 5/5. Decreased LT/pp in both feet.  Musculoskeletal:Full ROM, No pain with AROM or PROM in the neck, trunk, or extremities. Head forward posture.  Psych:Pt's affect is appropriate. Pt is cooperative       Assessment & Plan:  1. Diabetes II with painful peripheral neuropathy 2. Bipolar disorder followed by [facility] 3. Chronic foot wounds/calluses--resolved 4. Cervical spondylosis   Plan:  1. Titrate cymbalta to 30mg  daily.    2. Consider TENS trial for feet, conductive garments once he has covg. We also discussed PT in 2019. Reviewed HEP 3. Will add low dose pamelor, 10-20mg  qhs 4. Continued tight management of diabetes as we've discussed.  5. Maintain skin care   15 minutes of face to face patient care time were spent during this visit. All questions were encouraged and answered. Follow up in 2 months. Greater than 50% of time during this encounter was spent counseling patient/family in regard to HEP, etiologies of pain, mgt considerations.

## 2016-11-13 DIAGNOSIS — Z8679 Personal history of other diseases of the circulatory system: Secondary | ICD-10-CM

## 2016-11-13 HISTORY — DX: Personal history of other diseases of the circulatory system: Z86.79

## 2016-11-13 MED FILL — LOSARTAN POTASSIUM 50 MG TA: 50 | 30 days supply | Qty: 30 | Fill #3

## 2016-11-13 MED FILL — ?FENOFIBRATE 145 MG TABS: 145 | 30 days supply | Qty: 30 | Fill #4

## 2016-11-13 MED FILL — METFORMIN HCL ER 500 MG TAB: 500 | 30 days supply | Qty: 60 | Fill #6

## 2016-11-25 ENCOUNTER — Other Ambulatory Visit: Payer: Self-pay

## 2016-11-25 ENCOUNTER — Inpatient Hospital Stay (HOSPITAL_COMMUNITY): Payer: Medicare Other

## 2016-11-25 ENCOUNTER — Encounter (HOSPITAL_COMMUNITY): Payer: Self-pay

## 2016-11-25 ENCOUNTER — Emergency Department (HOSPITAL_COMMUNITY): Payer: Medicare Other

## 2016-11-25 ENCOUNTER — Inpatient Hospital Stay (HOSPITAL_COMMUNITY)
Admission: EM | Admit: 2016-11-25 | Discharge: 2016-11-26 | DRG: 917 | Disposition: A | Discharge status: Other Acute Inpatient Hospital | Payer: Medicare Other | Attending: Internal Medicine | Admitting: Internal Medicine

## 2016-11-25 DIAGNOSIS — E1165 Type 2 diabetes mellitus with hyperglycemia: Secondary | ICD-10-CM | POA: Diagnosis present

## 2016-11-25 DIAGNOSIS — Z8249 Family history of ischemic heart disease and other diseases of the circulatory system: Secondary | ICD-10-CM

## 2016-11-25 DIAGNOSIS — K5900 Constipation, unspecified: Secondary | ICD-10-CM | POA: Diagnosis present

## 2016-11-25 DIAGNOSIS — T50902A Poisoning by unspecified drugs, medicaments and biological substances, intentional self-harm, initial encounter: Secondary | ICD-10-CM

## 2016-11-25 DIAGNOSIS — R4182 Altered mental status, unspecified: Secondary | ICD-10-CM | POA: Diagnosis present

## 2016-11-25 DIAGNOSIS — Z794 Long term (current) use of insulin: Secondary | ICD-10-CM | POA: Diagnosis not present

## 2016-11-25 DIAGNOSIS — Z79899 Other long term (current) drug therapy: Secondary | ICD-10-CM | POA: Diagnosis not present

## 2016-11-25 DIAGNOSIS — Z419 Encounter for procedure for purposes other than remedying health state, unspecified: Secondary | ICD-10-CM

## 2016-11-25 DIAGNOSIS — N179 Acute kidney failure, unspecified: Secondary | ICD-10-CM | POA: Diagnosis present

## 2016-11-25 DIAGNOSIS — G44311 Acute post-traumatic headache, intractable: Secondary | ICD-10-CM

## 2016-11-25 DIAGNOSIS — I469 Cardiac arrest, cause unspecified: Secondary | ICD-10-CM | POA: Diagnosis not present

## 2016-11-25 DIAGNOSIS — E1142 Type 2 diabetes mellitus with diabetic polyneuropathy: Secondary | ICD-10-CM | POA: Diagnosis present

## 2016-11-25 DIAGNOSIS — Z01818 Encounter for other preprocedural examination: Secondary | ICD-10-CM

## 2016-11-25 DIAGNOSIS — R9431 Abnormal electrocardiogram [ECG] [EKG]: Secondary | ICD-10-CM | POA: Diagnosis not present

## 2016-11-25 DIAGNOSIS — G4733 Obstructive sleep apnea (adult) (pediatric): Secondary | ICD-10-CM | POA: Diagnosis present

## 2016-11-25 DIAGNOSIS — E872 Acidosis: Secondary | ICD-10-CM | POA: Diagnosis present

## 2016-11-25 DIAGNOSIS — F319 Bipolar disorder, unspecified: Secondary | ICD-10-CM | POA: Diagnosis present

## 2016-11-25 DIAGNOSIS — T476X1A Poisoning by antidiarrheal drugs, accidental (unintentional), initial encounter: Principal | ICD-10-CM | POA: Diagnosis present

## 2016-11-25 DIAGNOSIS — I10 Essential (primary) hypertension: Secondary | ICD-10-CM | POA: Diagnosis present

## 2016-11-25 DIAGNOSIS — Z833 Family history of diabetes mellitus: Secondary | ICD-10-CM

## 2016-11-25 DIAGNOSIS — E876 Hypokalemia: Secondary | ICD-10-CM | POA: Diagnosis present

## 2016-11-25 DIAGNOSIS — K219 Gastro-esophageal reflux disease without esophagitis: Secondary | ICD-10-CM | POA: Diagnosis present

## 2016-11-25 DIAGNOSIS — Y92410 Unspecified street and highway as the place of occurrence of the external cause: Secondary | ICD-10-CM

## 2016-11-25 DIAGNOSIS — E785 Hyperlipidemia, unspecified: Secondary | ICD-10-CM | POA: Diagnosis present

## 2016-11-25 DIAGNOSIS — E1140 Type 2 diabetes mellitus with diabetic neuropathy, unspecified: Secondary | ICD-10-CM | POA: Diagnosis present

## 2016-11-25 DIAGNOSIS — I472 Ventricular tachycardia, unspecified: Secondary | ICD-10-CM

## 2016-11-25 DIAGNOSIS — F1110 Opioid abuse, uncomplicated: Secondary | ICD-10-CM | POA: Diagnosis present

## 2016-11-25 DIAGNOSIS — T50901A Poisoning by unspecified drugs, medicaments and biological substances, accidental (unintentional), initial encounter: Secondary | ICD-10-CM | POA: Diagnosis present

## 2016-11-25 DIAGNOSIS — Z4659 Encounter for fitting and adjustment of other gastrointestinal appliance and device: Secondary | ICD-10-CM

## 2016-11-25 DIAGNOSIS — Z888 Allergy status to other drugs, medicaments and biological substances status: Secondary | ICD-10-CM

## 2016-11-25 DIAGNOSIS — I462 Cardiac arrest due to underlying cardiac condition: Secondary | ICD-10-CM | POA: Diagnosis present

## 2016-11-25 LAB — COMPREHENSIVE METABOLIC PANEL
ALT: 27 U/L (ref 17–63)
ALT: 33 U/L (ref 17–63)
AST: 43 U/L — ABNORMAL HIGH (ref 15–41)
AST: 41 U/L (ref 15–41)
Albumin: 4.4 g/dL (ref 3.5–5.0)
Albumin: 3.8 g/dL (ref 3.5–5.0)
Alkaline Phosphatase: 45 U/L (ref 38–126)
Alkaline Phosphatase: 40 U/L (ref 38–126)
Anion gap: 11 (ref 5–15)
Anion gap: 13 (ref 5–15)
BUN: 23 mg/dL — ABNORMAL HIGH (ref 6–20)
BUN: 19 mg/dL (ref 6–20)
CO2: 24 mmol/L (ref 22–32)
CO2: 28 mmol/L (ref 22–32)
Calcium: 9.7 mg/dL (ref 8.9–10.3)
Calcium: 8.2 mg/dL — ABNORMAL LOW (ref 8.9–10.3)
Chloride: 103 mmol/L (ref 101–111)
Chloride: 98 mmol/L — ABNORMAL LOW (ref 101–111)
Creatinine, Ser: 2.57 mg/dL — ABNORMAL HIGH (ref 0.61–1.24)
Creatinine, Ser: 1.85 mg/dL — ABNORMAL HIGH (ref 0.61–1.24)
GFR calc Af Amer: 31 mL/min — ABNORMAL LOW (ref >60)
GFR calc Af Amer: 46 mL/min — ABNORMAL LOW (ref >60)
GFR calc non Af Amer: 26 mL/min — ABNORMAL LOW (ref >60)
GFR calc non Af Amer: 39 mL/min — ABNORMAL LOW (ref >60)
Glucose, Bld: 144 mg/dL — ABNORMAL HIGH (ref 65–99)
Glucose, Bld: 183 mg/dL — ABNORMAL HIGH (ref 65–99)
Potassium: 4.2 mmol/L (ref 3.5–5.1)
Potassium: 3.1 mmol/L — ABNORMAL LOW (ref 3.5–5.1)
Sodium: 138 mmol/L (ref 135–145)
Sodium: 139 mmol/L (ref 135–145)
Total Bilirubin: 0.7 mg/dL (ref 0.3–1.2)
Total Bilirubin: 0.7 mg/dL (ref 0.3–1.2)
Total Protein: 7.1 g/dL (ref 6.5–8.1)
Total Protein: 6.2 g/dL — ABNORMAL LOW (ref 6.5–8.1)

## 2016-11-25 LAB — I-STAT ARTERIAL BLOOD GAS, ED
Acid-Base Excess: 5 mmol/L — ABNORMAL HIGH (ref 0.0–2.0)
Bicarbonate: 29.6 mmol/L — ABNORMAL HIGH (ref 20.0–28.0)
O2 Saturation: 95 %
TCO2: 31 mmol/L (ref 22–32)
pCO2 arterial: 41.7 mmHg (ref 32.0–48.0)
pH, Arterial: 7.459 — ABNORMAL HIGH (ref 7.350–7.450)
pO2, Arterial: 74 mmHg — ABNORMAL LOW (ref 83.0–108.0)

## 2016-11-25 LAB — CBG MONITORING, ED
Glucose-Capillary: 149 mg/dL — ABNORMAL HIGH (ref 65–99)
Glucose-Capillary: 157 mg/dL — ABNORMAL HIGH (ref 65–99)

## 2016-11-25 LAB — POCT I-STAT 3, ART BLOOD GAS (G3+)
Acid-Base Excess: 9 mmol/L — ABNORMAL HIGH (ref 0.0–2.0)
Bicarbonate: 33 mmol/L — ABNORMAL HIGH (ref 20.0–28.0)
O2 Saturation: 100 %
Patient temperature: 99.2
TCO2: 34 mmol/L — ABNORMAL HIGH (ref 22–32)
pCO2 arterial: 41.1 mmHg (ref 32.0–48.0)
pH, Arterial: 7.515 — ABNORMAL HIGH (ref 7.350–7.450)
pO2, Arterial: 441 mmHg — ABNORMAL HIGH (ref 83.0–108.0)

## 2016-11-25 LAB — CBC WITH DIFFERENTIAL/PLATELET
Basophils Absolute: 0.1 10*3/uL (ref 0.0–0.1)
Basophils Relative: 0 %
Eosinophils Absolute: 0.2 10*3/uL (ref 0.0–0.7)
Eosinophils Relative: 2 %
HCT: 39.6 % (ref 39.0–52.0)
Hemoglobin: 13.7 g/dL (ref 13.0–17.0)
Lymphocytes Relative: 48 %
Lymphs Abs: 5.4 10*3/uL — ABNORMAL HIGH (ref 0.7–4.0)
MCH: 28 pg (ref 26.0–34.0)
MCHC: 34.6 g/dL (ref 30.0–36.0)
MCV: 80.8 fL (ref 78.0–100.0)
Monocytes Absolute: 0.9 10*3/uL (ref 0.1–1.0)
Monocytes Relative: 8 %
Neutro Abs: 4.7 10*3/uL (ref 1.7–7.7)
Neutrophils Relative %: 42 %
Platelets: 153 10*3/uL (ref 150–400)
RBC: 4.9 MIL/uL (ref 4.22–5.81)
RDW: 13 % (ref 11.5–15.5)
WBC: 11.3 10*3/uL — ABNORMAL HIGH (ref 4.0–10.5)

## 2016-11-25 LAB — PHOSPHORUS: Phosphorus: 2.6 mg/dL (ref 2.5–4.6)

## 2016-11-25 LAB — TROPONIN I
Troponin I: 0.08 ng/mL (ref <0.03)
Troponin I: 0.08 ng/mL (ref <0.03)

## 2016-11-25 LAB — I-STAT CHEM 8, ED
BUN: 25 mg/dL — ABNORMAL HIGH (ref 6–20)
Calcium, Ion: 1.18 mmol/L (ref 1.15–1.40)
Chloride: 104 mmol/L (ref 101–111)
Creatinine, Ser: 2.3 mg/dL — ABNORMAL HIGH (ref 0.61–1.24)
Glucose, Bld: 148 mg/dL — ABNORMAL HIGH (ref 65–99)
HCT: 38 % — ABNORMAL LOW (ref 39.0–52.0)
Hemoglobin: 12.9 g/dL — ABNORMAL LOW (ref 13.0–17.0)
Potassium: 4.2 mmol/L (ref 3.5–5.1)
Sodium: 140 mmol/L (ref 135–145)
TCO2: 24 mmol/L (ref 22–32)

## 2016-11-25 LAB — MAGNESIUM
Magnesium: 1.8 mg/dL (ref 1.7–2.4)
Magnesium: 2 mg/dL (ref 1.7–2.4)

## 2016-11-25 LAB — I-STAT CG4 LACTIC ACID, ED
Lactic Acid, Venous: 3.58 mmol/L (ref 0.5–1.9)
Lactic Acid, Venous: 1.94 mmol/L — ABNORMAL HIGH (ref 0.5–1.9)

## 2016-11-25 LAB — I-STAT TROPONIN, ED: Troponin i, poc: 0.03 ng/mL (ref 0.00–0.08)

## 2016-11-25 LAB — GLUCOSE, CAPILLARY: Glucose-Capillary: 155 mg/dL — ABNORMAL HIGH (ref 65–99)

## 2016-11-25 MED ORDER — INSULIN GLARGINE 100 UNIT/ML ~~LOC~~ SOLN: 10.0000 [IU] | Freq: Every day | SUBCUTANEOUS | Status: DC

## 2016-11-25 MED ORDER — FENTANYL CITRATE (PF) 100 MCG/2ML IJ SOLN
INTRAMUSCULAR | Status: AC
  Administered 2016-11-25: 100 ug
  Filled 2016-11-25: qty 2

## 2016-11-25 MED ORDER — FAT EMULSION 20 % IV EMUL
1000.0000 mL | INTRAVENOUS | Status: DC
  Administered 2016-11-26: 1000 mL via INTRAVENOUS
  Filled 2016-11-25 (×8): qty 1000

## 2016-11-25 MED ORDER — INSULIN ASPART 100 UNIT/ML ~~LOC~~ SOLN
0.0000 [IU] | Freq: Three times a day (TID) | SUBCUTANEOUS | Status: DC
Start: 2016-11-26 — End: 2016-11-26

## 2016-11-25 MED ORDER — FENTANYL CITRATE (PF) 100 MCG/2ML IJ SOLN
INTRAMUSCULAR | Status: AC
  Administered 2016-11-25: 100 ug
  Filled 2016-11-25: qty 4

## 2016-11-25 MED ORDER — SODIUM BICARBONATE 8.4 % IV SOLN
50.0000 meq | Freq: Once | INTRAVENOUS | Status: AC
  Administered 2016-11-25: 50 meq via INTRAVENOUS
  Filled 2016-11-25: qty 50

## 2016-11-25 MED ORDER — MIDAZOLAM HCL 2 MG/2ML IJ SOLN
2.0000 mg | INTRAMUSCULAR | Status: DC | PRN
  Administered 2016-11-25 – 2016-11-26 (×2): 2 mg via INTRAVENOUS
  Filled 2016-11-25: qty 2

## 2016-11-25 MED ORDER — MIDAZOLAM HCL 2 MG/2ML IJ SOLN
INTRAMUSCULAR | Status: AC
  Administered 2016-11-25: 2 mg
  Filled 2016-11-25: qty 4

## 2016-11-25 MED ORDER — MAGNESIUM SULFATE 2 GM/50ML IV SOLN
2.0000 g | Freq: Once | INTRAVENOUS | Status: AC
  Administered 2016-11-25: 2 g via INTRAVENOUS

## 2016-11-25 MED ORDER — FAT EMULSION 20 % IV EMUL: 1000.0000 mL | INTRAVENOUS | Status: DC

## 2016-11-25 MED ORDER — STERILE WATER FOR INJECTION IV SOLN
INTRAVENOUS | Status: DC
  Filled 2016-11-25 (×2): qty 850

## 2016-11-25 MED ORDER — INSULIN GLARGINE 100 UNIT/ML ~~LOC~~ SOLN
30.0000 [IU] | Freq: Every day | SUBCUTANEOUS | Status: DC
  Administered 2016-11-25: 30 [IU] via SUBCUTANEOUS
  Filled 2016-11-25: qty 0.3

## 2016-11-25 MED ORDER — FENTANYL 2500MCG IN NS 250ML (10MCG/ML) PREMIX INFUSION
25.0000 ug/h | INTRAVENOUS | Status: DC
  Administered 2016-11-25: 300 ug/h via INTRAVENOUS
  Administered 2016-11-26: 200 ug/h via INTRAVENOUS
  Administered 2016-11-26: 400 ug/h via INTRAVENOUS
  Filled 2016-11-25: qty 250
  Filled 2016-11-25: qty 500
  Filled 2016-11-25: qty 250

## 2016-11-25 MED ORDER — SODIUM CHLORIDE 3 % IV SOLN
INTRAVENOUS | Status: AC
  Administered 2016-11-25: 500 mL/h via INTRAVENOUS
  Filled 2016-11-25: qty 500

## 2016-11-25 MED ORDER — HYDRALAZINE HCL 20 MG/ML IJ SOLN
10.0000 mg | INTRAMUSCULAR | Status: DC | PRN
  Administered 2016-11-26: 10 mg via INTRAVENOUS
  Administered 2016-11-26 (×2): 40 mg via INTRAVENOUS
  Filled 2016-11-25 (×2): qty 1
  Filled 2016-11-25: qty 2
  Filled 2016-11-25: qty 1

## 2016-11-25 MED ORDER — MIDAZOLAM HCL 2 MG/2ML IJ SOLN
INTRAMUSCULAR | Status: AC
  Administered 2016-11-25: 2 mg
  Filled 2016-11-25: qty 2

## 2016-11-25 MED ORDER — MIDAZOLAM HCL 2 MG/2ML IJ SOLN
2.0000 mg | INTRAMUSCULAR | Status: DC | PRN
Start: 2016-11-25 — End: 2016-11-26
  Filled 2016-11-25: qty 2

## 2016-11-25 MED ORDER — SODIUM BICARBONATE 8.4 % IV SOLN
INTRAVENOUS | Status: AC
  Filled 2016-11-25: qty 50

## 2016-11-25 MED ORDER — STERILE WATER FOR INJECTION IV SOLN
INTRAVENOUS | Status: DC
  Administered 2016-11-25 – 2016-11-26 (×2): via INTRAVENOUS
  Filled 2016-11-25 (×3): qty 850

## 2016-11-25 MED ORDER — SODIUM CHLORIDE 0.9 % IV SOLN
INTRAVENOUS | Status: DC | PRN
  Administered 2016-11-25: 23:00:00 via INTRA_ARTERIAL

## 2016-11-25 MED ORDER — ATORVASTATIN CALCIUM 40 MG PO TABS: 40.0000 mg | ORAL_TABLET | Freq: Every day | ORAL | Status: DC

## 2016-11-25 MED ORDER — FENTANYL CITRATE (PF) 100 MCG/2ML IJ SOLN
50.0000 ug | Freq: Once | INTRAMUSCULAR | Status: AC
  Administered 2016-11-25: 50 ug via INTRAVENOUS

## 2016-11-25 MED ORDER — QUETIAPINE FUMARATE 300 MG PO TABS
300.0000 mg | ORAL_TABLET | Freq: Every day | ORAL | Status: DC
  Filled 2016-11-25: qty 1

## 2016-11-25 MED ORDER — INSULIN ASPART 100 UNIT/ML ~~LOC~~ SOLN: 0.0000 [IU] | Freq: Every day | SUBCUTANEOUS | Status: DC

## 2016-11-25 MED ORDER — SODIUM BICARBONATE 8.4 % IV SOLN
50.0000 meq | Freq: Once | INTRAVENOUS | Status: AC
  Administered 2016-11-25: 50 meq via INTRAVENOUS

## 2016-11-25 MED ORDER — LIDOCAINE HCL (PF) 1 % IJ SOLN
INTRAMUSCULAR | Status: AC
  Filled 2016-11-25: qty 30

## 2016-11-25 MED ORDER — AMIODARONE HCL IN DEXTROSE 360-4.14 MG/200ML-% IV SOLN
INTRAVENOUS | Status: AC
  Filled 2016-11-25: qty 200

## 2016-11-25 MED ORDER — DULOXETINE HCL 30 MG PO CPEP: 30.0000 mg | ORAL_CAPSULE | Freq: Every day | ORAL | Status: DC

## 2016-11-25 MED ORDER — AMIODARONE LOAD VIA INFUSION
150.0000 mg | Freq: Once | INTRAVENOUS | Status: AC
  Administered 2016-11-25: 150 mg via INTRAVENOUS
  Filled 2016-11-25: qty 83.34

## 2016-11-25 MED ORDER — MIDAZOLAM HCL 2 MG/2ML IJ SOLN
INTRAMUSCULAR | Status: AC
  Administered 2016-11-25: 4 mg
  Filled 2016-11-25: qty 4

## 2016-11-25 MED ORDER — FENTANYL BOLUS VIA INFUSION
50.0000 ug | INTRAVENOUS | Status: DC | PRN
  Filled 2016-11-25: qty 50

## 2016-11-25 MED ORDER — PRAZOSIN HCL 1 MG PO CAPS
1.0000 mg | ORAL_CAPSULE | Freq: Every day | ORAL | Status: DC
  Filled 2016-11-25: qty 1

## 2016-11-25 MED ORDER — FENTANYL CITRATE (PF) 100 MCG/2ML IJ SOLN
INTRAMUSCULAR | Status: AC
  Filled 2016-11-25: qty 2

## 2016-11-25 MED ORDER — DOCUSATE SODIUM 50 MG/5ML PO LIQD: 100.0000 mg | Freq: Two times a day (BID) | ORAL | Status: DC | PRN

## 2016-11-25 MED ORDER — AMIODARONE IV BOLUS ONLY 150 MG/100ML
150.0000 mg | Freq: Once | INTRAVENOUS | Status: AC
  Administered 2016-11-25: 150 mg via INTRAVENOUS

## 2016-11-25 MED ORDER — ACETAMINOPHEN 650 MG RE SUPP: 650.0000 mg | Freq: Four times a day (QID) | RECTAL | Status: DC | PRN

## 2016-11-25 MED ORDER — AMIODARONE HCL IN DEXTROSE 360-4.14 MG/200ML-% IV SOLN
60.0000 mg/h | INTRAVENOUS | Status: DC
  Administered 2016-11-25: 60 mg/h via INTRAVENOUS

## 2016-11-25 MED ORDER — FAT EMULSION 20 % IV EMUL
1250.0000 mL | INTRAVENOUS | Status: DC
  Filled 2016-11-25 (×10): qty 1500

## 2016-11-25 MED ORDER — FENTANYL CITRATE (PF) 100 MCG/2ML IJ SOLN
75.0000 ug | Freq: Once | INTRAMUSCULAR | Status: AC
  Administered 2016-11-25: 75 ug via INTRAVENOUS

## 2016-11-25 MED ORDER — ACETAMINOPHEN 325 MG PO TABS: 650.0000 mg | ORAL_TABLET | Freq: Four times a day (QID) | ORAL | Status: DC | PRN

## 2016-11-25 MED ORDER — SODIUM CHLORIDE 0.9 % IV BOLUS (SEPSIS)
1000.0000 mL | Freq: Once | INTRAVENOUS | Status: AC
  Administered 2016-11-25: 1000 mL via INTRAVENOUS

## 2016-11-25 MED ORDER — SODIUM CHLORIDE 0.9 % IV SOLN
INTRAVENOUS | Status: DC
  Administered 2016-11-25: 20:00:00 via INTRAVENOUS

## 2016-11-25 MED ORDER — FAT EMULSION 20 % IV EMUL
165.0000 mL | Freq: Once | INTRAVENOUS | Status: DC
  Administered 2016-11-26: 165 mL via INTRAVENOUS
  Filled 2016-11-25 (×2): qty 200

## 2016-11-25 MED ORDER — AMIODARONE HCL IN DEXTROSE 360-4.14 MG/200ML-% IV SOLN
30.0000 mg/h | INTRAVENOUS | Status: DC
  Filled 2016-11-25: qty 200

## 2016-11-25 MED ORDER — NORTRIPTYLINE HCL 10 MG PO CAPS: 10.0000 mg | ORAL_CAPSULE | Freq: Every day | ORAL | Status: DC

## 2016-11-25 MED ORDER — SODIUM CHLORIDE 3 % CONCENTRATED NICU IV INFUSION: 0.2500 meq/kg/h | Freq: Once | INTRAVENOUS | Status: DC

## 2016-11-25 MED ORDER — MIDAZOLAM HCL 50 MG/10ML IJ SOLN
0.0000 mg/h | INTRAMUSCULAR | Status: DC
  Administered 2016-11-25 – 2016-11-26 (×2): 4 mg/h via INTRAVENOUS
  Filled 2016-11-25 (×3): qty 10

## 2016-11-25 MED ORDER — MAGNESIUM SULFATE 2 GM/50ML IV SOLN
2.0000 g | Freq: Once | INTRAVENOUS | Status: AC
  Administered 2016-11-25: 2 g via INTRAVENOUS
  Filled 2016-11-25: qty 50

## 2016-11-25 NOTE — ED Provider Notes (Addendum)
I assumed care of this patient from Dr. Tyrone Nine at [phone].  Please see their note for further details of Hx, PE.  Briefly patient is a 56 y.o. male who presented in cardiac arrest after overdosing on loperamide.  Return of spontaneous circulation was achieved.  She is altered mental status intact.  Hemodynamically stable.  EKG with widened QRS and QT.  Patient given  magnesium and bicarb.  Cardiology consulted.  Plan pending cardiology recommendation.  Cardiology will consult on the patient requested internal medicine admission to stepdown unit.  Case discussed with medicine. Admitted to SDU.  Patient had an episode of V. tach requiring cardioversion.  Due to this medicine consulted critical care who will admit the patient to the ICU.    2130 pm Patient went into V. tach requiring cardioversion.  Converted to normal sinus rhythm.  Additional bicarb bolus was given.   also given magnesium bolus.  2145 pm Patient again went into V. tach requiring cardioversion.  Converted to normal sinus rhythm.  Patient given additional bolus of amiodarone.  2200 pm Transported up to the ICU.  Marland KitchenCardioversion Date/Time: 11/25/2016 9:30 PM Performed by: Fatima Blank, MD Authorized by: Fatima Blank, MD   Consent:    Consent obtained:  Verbal and emergent situation   Consent given by:  Patient   Risks discussed:  Cutaneous burn, induced arrhythmia and pain Pre-procedure details:    Cardioversion basis:  Emergent   Rhythm:  Ventricular tachycardia Attempt one:    Cardioversion mode:  Asynchronous   Waveform:  Biphasic   Shock (Joules):  120   Shock outcome:  Conversion to normal sinus rhythm Post-procedure details:    Patient status:  Alert   Patient tolerance of procedure:  Tolerated well, no immediate complications .Cardioversion Date/Time: 11/25/2016 10:52 PM Performed by: Fatima Blank, MD Authorized by: Fatima Blank, MD   Consent:    Consent obtained:   Emergent situation Pre-procedure details:    Cardioversion basis:  Emergent   Rhythm:  Ventricular tachycardia Attempt one:    Cardioversion mode:  Asynchronous   Waveform:  Biphasic   Shock (Joules):  120   Shock outcome:  Conversion to normal sinus rhythm Post-procedure details:    Patient status:  Alert   Patient tolerance of procedure:  Tolerated well, no immediate complications      Cardama, Grayce Sessions, MD 11/25/16 2253

## 2016-11-25 NOTE — ED Notes (Signed)
Assumed care on pt. , denies pain at this time /respirations unlabored , IV sites intact , family at bedside . HR= 61/min .

## 2016-11-25 NOTE — ED Provider Notes (Signed)
Cardiopulmonary Resuscitation (CPR) Procedure Note Directed/Performed by: Varney Biles I personally directed ancillary staff and/or performed CPR in an effort to regain return of spontaneous circulation and to maintain cardiac, neuro and systemic perfusion.   Pt went into a V-tach arrest. Pt suddenly started feeling dizzy, nauseated and sweaty. He then started having head shaking/bobbing per family and RN noted that pt was in wide complex tachycardia and gave a defibrillator shock. Pt received a shock at 8:20 and 8:21 - and he is back into sinus rhythm.  Pt is ao x 3. Repeat EKG ordered. CCM consulted - they will now admit the patient. Amiodarone 150 mg bolus followed by infusion ordered.  Pt allegedly took several loperamide pills (>100 pills) in the lasr 24 hours, and the cardiac arrhythmia is related to the overdose.      Varney Biles, MD 11/25/16 2039

## 2016-11-25 NOTE — ED Notes (Signed)
RN accompanying patient to CT. Patient has Zoll pads and cardiac monitor on.

## 2016-11-25 NOTE — ED Notes (Signed)
Pt hypotensive in triage. BP obtained in left arm at that time and BP 106/66. Pt reports he has not taken his BP medication today and was dizzy this morning when getting out of bed.

## 2016-11-25 NOTE — ED Notes (Signed)
Pt. shocked x2 for 2 episodes of V-tach , converted to NSR 1t 73/min, EDP notified , ordered Fentany IV 75 mcg.

## 2016-11-25 NOTE — Progress Notes (Signed)
Chaplain's presence was requested to patient's bedside in Trauma A. The presenting pastoral issue is that [name] [name] is understandably very afraid and anxious. He is sensing that his condition is unstable and has been frightened by the amount of times that he has needed to be shocked. At bedside Chaplain offered prayers at request of patient and ministry of presence and comfort.

## 2016-11-25 NOTE — ED Notes (Signed)
Reordered dinner tray 

## 2016-11-25 NOTE — ED Notes (Addendum)
Patient remained in V-tach following first shock, Bobby, RN shocked at 150J.

## 2016-11-25 NOTE — Procedures (Signed)
Central Venous Catheter Insertion Procedure Note [name] [name] [id number] 04-17-1960  Procedure: Insertion of Central Venous Catheter Indications: Assessment of intravascular volume, Drug and/or fluid administration and Frequent blood sampling  Procedure Details Consent: Risks of procedure as well as the alternatives and risks of each were explained to the (patient/caregiver).  Consent for procedure obtained. Time Out: Verified patient identification, verified procedure, site/side was marked, verified correct patient position, special equipment/implants available, medications/allergies/relevent history reviewed, required imaging and test results available.  Performed  Maximum sterile technique was used including antiseptics, cap, gloves, gown, hand hygiene, mask and sheet. Skin prep: Chlorhexidine; local anesthetic administered A antimicrobial bonded/coated triple lumen catheter was placed in the right internal jugular vein using the Seldinger technique.  Evaluation Blood flow good Complications: No apparent complications Patient did tolerate procedure well. Chest X-ray ordered to verify placement.  CXR: pending.  Procedure performed under direct ultrasound guidance for real time vessel cannulation.      [name] [name], [name] [name] [facility] Pager: [phone]  or [phone] 11/25/2016, 10:44 PM

## 2016-11-25 NOTE — ED Notes (Signed)
Patient states he is having pressure in his lower abdomen and feels like he has to constantly pee, reports this happening before and it is because he is constipated. Patient requesting enema or cath to relieve pressure. MD made aware.

## 2016-11-25 NOTE — ED Notes (Addendum)
Patient was talking with family at bedside, pt reports becoming dizzy, patient became pale, eyes rolled into back of head, staff at [place], RN shocked at 120J.
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
## 2016-11-25 NOTE — Procedures (Signed)
Arterial Catheter Insertion Procedure Note TEANCUM BRULE [id number] 12-06-1960  Procedure: Insertion of Arterial Catheter  Indications: Blood pressure monitoring and Frequent blood sampling  Procedure Details Consent: Risks of procedure as well as the alternatives and risks of each were explained to the (patient/caregiver).  Consent for procedure obtained. Time Out: Verified patient identification, verified procedure, site/side was marked, verified correct patient position, special equipment/implants available, medications/allergies/relevent history reviewed, required imaging and test results available.  Performed  Maximum sterile technique was used including antiseptics, cap, gloves, gown, hand hygiene, mask and sheet. Skin prep: Chlorhexidine; local anesthetic administered 20 gauge catheter was inserted into left radial artery using the Seldinger technique.  Evaluation Blood flow good; BP tracing good. Complications: No apparent complications.   Cyril Mourning A Audianna Landgren 11/25/2016

## 2016-11-25 NOTE — ED Notes (Signed)
Family at bedside. 
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
## 2016-11-25 NOTE — ED Notes (Addendum)
Patient alert , respirations unlabored , NSR on the monitor , IV sites intact , NS IV infusing , pt. waiting for admitting ICU MD . Pharmacist notified to send his antihypertensive medication , Amiodarone drip infusing at 60 mg./hr.

## 2016-11-25 NOTE — ED Notes (Signed)
Pt rushed to trauma A after being found in waiting room pulseless with agonal breathing. Bystanders in the waiting room saw patient slump over while sitting in the chair in the waiting room. Saralyn Pilar EMT was called over and could not feel a pulse and pt was agonal breathing. Evelena Asa, EMT and Darci Current, charge RN also at patient and no pulse palpated. CPR initiated immediately, pt placed onto stretcher and rushed back to trauma A. Upon arrival to trauma A pts eyes opened,  Pulse. CPR stopped. Pt now alert and talking. MD Wynetta Fines at bedside.

## 2016-11-25 NOTE — ED Notes (Signed)
Attempted to call report but was advised that pt. will be upgraded to an ICU unit .

## 2016-11-25 NOTE — Progress Notes (Signed)
Patient was on stretcher in emotional distress. Many family members present. Chaplain took family to consult room and had reflective listening and compassionate presence.  Chaplain Marslline.

## 2016-11-25 NOTE — ED Notes (Addendum)
Pt. shocked again x2 for Vtach and cnoverted to NSR , Dr. Leonette Monarch notified . , pharmacist ordered to give Amiodarone  150 mg bolus.
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
## 2016-11-26 ENCOUNTER — Inpatient Hospital Stay (HOSPITAL_COMMUNITY): Payer: Medicare Other | Admitting: Certified Registered Nurse Anesthetist

## 2016-11-26 ENCOUNTER — Inpatient Hospital Stay (HOSPITAL_COMMUNITY): Payer: Medicare Other

## 2016-11-26 ENCOUNTER — Encounter (HOSPITAL_COMMUNITY)
Admission: EM | Disposition: A | Discharge status: Other Acute Inpatient Hospital | Payer: Self-pay | Source: Home / Self Care | Attending: Internal Medicine

## 2016-11-26 DIAGNOSIS — T50902A Poisoning by unspecified drugs, medicaments and biological substances, intentional self-harm, initial encounter: Secondary | ICD-10-CM

## 2016-11-26 DIAGNOSIS — Z9889 Other specified postprocedural states: Secondary | ICD-10-CM | POA: Insufficient documentation

## 2016-11-26 DIAGNOSIS — I472 Ventricular tachycardia: Secondary | ICD-10-CM

## 2016-11-26 DIAGNOSIS — Z9281 Personal history of extracorporeal membrane oxygenation (ECMO): Secondary | ICD-10-CM | POA: Insufficient documentation

## 2016-11-26 HISTORY — PX: INTRAOPERATIVE TRANSESOPHAGEAL ECHOCARDIOGRAM: SHX5062

## 2016-11-26 LAB — COMPREHENSIVE METABOLIC PANEL
ALT: 32 U/L (ref 17–63)
ALT: 31 U/L (ref 17–63)
ALT: 28 U/L (ref 17–63)
AST: 56 U/L — ABNORMAL HIGH (ref 15–41)
AST: 35 U/L (ref 15–41)
AST: 57 U/L — ABNORMAL HIGH (ref 15–41)
Albumin: 3.7 g/dL (ref 3.5–5.0)
Albumin: 3.6 g/dL (ref 3.5–5.0)
Albumin: 3.5 g/dL (ref 3.5–5.0)
Alkaline Phosphatase: 34 U/L — ABNORMAL LOW (ref 38–126)
Alkaline Phosphatase: 38 U/L (ref 38–126)
Alkaline Phosphatase: 38 U/L (ref 38–126)
Anion gap: 11 (ref 5–15)
Anion gap: 13 (ref 5–15)
Anion gap: 13 (ref 5–15)
BUN: 17 mg/dL (ref 6–20)
BUN: 13 mg/dL (ref 6–20)
BUN: 13 mg/dL (ref 6–20)
CO2: 24 mmol/L (ref 22–32)
CO2: 27 mmol/L (ref 22–32)
CO2: 28 mmol/L (ref 22–32)
Calcium: 7.8 mg/dL — ABNORMAL LOW (ref 8.9–10.3)
Calcium: 7.9 mg/dL — ABNORMAL LOW (ref 8.9–10.3)
Calcium: 7.8 mg/dL — ABNORMAL LOW (ref 8.9–10.3)
Chloride: 103 mmol/L (ref 101–111)
Chloride: 100 mmol/L — ABNORMAL LOW (ref 101–111)
Chloride: 99 mmol/L — ABNORMAL LOW (ref 101–111)
Creatinine, Ser: 1.56 mg/dL — ABNORMAL HIGH (ref 0.61–1.24)
Creatinine, Ser: 1.42 mg/dL — ABNORMAL HIGH (ref 0.61–1.24)
Creatinine, Ser: 1.49 mg/dL — ABNORMAL HIGH (ref 0.61–1.24)
GFR calc Af Amer: 56 mL/min — ABNORMAL LOW (ref >60)
GFR calc Af Amer: >60 mL/min (ref >60)
GFR calc Af Amer: 59 mL/min — ABNORMAL LOW (ref >60)
GFR calc non Af Amer: 48 mL/min — ABNORMAL LOW (ref >60)
GFR calc non Af Amer: 54 mL/min — ABNORMAL LOW (ref >60)
GFR calc non Af Amer: 51 mL/min — ABNORMAL LOW (ref >60)
Glucose, Bld: 190 mg/dL — ABNORMAL HIGH (ref 65–99)
Glucose, Bld: 157 mg/dL — ABNORMAL HIGH (ref 65–99)
Glucose, Bld: 169 mg/dL — ABNORMAL HIGH (ref 65–99)
Potassium: 4.3 mmol/L (ref 3.5–5.1)
Potassium: 3.4 mmol/L — ABNORMAL LOW (ref 3.5–5.1)
Potassium: 3.5 mmol/L (ref 3.5–5.1)
Sodium: 138 mmol/L (ref 135–145)
Sodium: 140 mmol/L (ref 135–145)
Sodium: 140 mmol/L (ref 135–145)
Total Bilirubin: 1.6 mg/dL — ABNORMAL HIGH (ref 0.3–1.2)
Total Bilirubin: 0.8 mg/dL (ref 0.3–1.2)
Total Bilirubin: 0.8 mg/dL (ref 0.3–1.2)
Total Protein: 5.6 g/dL — ABNORMAL LOW (ref 6.5–8.1)
Total Protein: 5.9 g/dL — ABNORMAL LOW (ref 6.5–8.1)
Total Protein: 5.8 g/dL — ABNORMAL LOW (ref 6.5–8.1)

## 2016-11-26 LAB — TYPE AND SCREEN
ABO/RH(D): A POS
Antibody Screen: NEGATIVE
Unit division: 0
Unit division: 0
Unit division: 0
Unit division: 0
Unit division: 0
Unit division: 0
Unit division: 0
Unit division: 0

## 2016-11-26 LAB — BPAM RBC
Blood Product Expiration Date: 201811302359
Blood Product Expiration Date: 201811302359
Blood Product Expiration Date: 201811302359
Blood Product Expiration Date: 201811302359
Blood Product Expiration Date: 201812032359
Blood Product Expiration Date: 201812032359
Blood Product Expiration Date: 201812052359
Blood Product Expiration Date: 201812052359
ISSUE DATE / TIME: 201811141116
ISSUE DATE / TIME: 201811141116
ISSUE DATE / TIME: 201811141116
ISSUE DATE / TIME: 201811141116
ISSUE DATE / TIME: 201811141211
ISSUE DATE / TIME: 201811141211
ISSUE DATE / TIME: 201811141211
ISSUE DATE / TIME: 201811141211
Unit Type and Rh: 6200
Unit Type and Rh: 6200
Unit Type and Rh: 6200
Unit Type and Rh: 6200
Unit Type and Rh: 6200
Unit Type and Rh: 6200
Unit Type and Rh: 6200
Unit Type and Rh: 6200

## 2016-11-26 LAB — POCT I-STAT, CHEM 8
BUN: 14 mg/dL (ref 6–20)
BUN: 12 mg/dL (ref 6–20)
Calcium, Ion: 1 mmol/L — ABNORMAL LOW (ref 1.15–1.40)
Calcium, Ion: 0.99 mmol/L — ABNORMAL LOW (ref 1.15–1.40)
Chloride: 95 mmol/L — ABNORMAL LOW (ref 101–111)
Chloride: 94 mmol/L — ABNORMAL LOW (ref 101–111)
Creatinine, Ser: 1.2 mg/dL (ref 0.61–1.24)
Creatinine, Ser: 1.2 mg/dL (ref 0.61–1.24)
Glucose, Bld: 203 mg/dL — ABNORMAL HIGH (ref 65–99)
Glucose, Bld: 220 mg/dL — ABNORMAL HIGH (ref 65–99)
HCT: 33 % — ABNORMAL LOW (ref 39.0–52.0)
HCT: 34 % — ABNORMAL LOW (ref 39.0–52.0)
Hemoglobin: 11.2 g/dL — ABNORMAL LOW (ref 13.0–17.0)
Hemoglobin: 11.6 g/dL — ABNORMAL LOW (ref 13.0–17.0)
Potassium: 3.4 mmol/L — ABNORMAL LOW (ref 3.5–5.1)
Potassium: 3.3 mmol/L — ABNORMAL LOW (ref 3.5–5.1)
Sodium: 133 mmol/L — ABNORMAL LOW (ref 135–145)
Sodium: 132 mmol/L — ABNORMAL LOW (ref 135–145)
TCO2: 34 mmol/L — ABNORMAL HIGH (ref 22–32)
TCO2: 31 mmol/L (ref 22–32)

## 2016-11-26 LAB — PREPARE FRESH FROZEN PLASMA
Unit division: 0
Unit division: 0

## 2016-11-26 LAB — POCT I-STAT 3, ART BLOOD GAS (G3+)
Acid-Base Excess: 9 mmol/L — ABNORMAL HIGH (ref 0.0–2.0)
Acid-Base Excess: 7 mmol/L — ABNORMAL HIGH (ref 0.0–2.0)
Acid-Base Excess: 6 mmol/L — ABNORMAL HIGH (ref 0.0–2.0)
Acid-Base Excess: 10 mmol/L — ABNORMAL HIGH (ref 0.0–2.0)
Acid-Base Excess: 12 mmol/L — ABNORMAL HIGH (ref 0.0–2.0)
Acid-Base Excess: 9 mmol/L — ABNORMAL HIGH (ref 0.0–2.0)
Bicarbonate: 34.5 mmol/L — ABNORMAL HIGH (ref 20.0–28.0)
Bicarbonate: 32.9 mmol/L — ABNORMAL HIGH (ref 20.0–28.0)
Bicarbonate: 30.6 mmol/L — ABNORMAL HIGH (ref 20.0–28.0)
Bicarbonate: 34.9 mmol/L — ABNORMAL HIGH (ref 20.0–28.0)
Bicarbonate: 36.7 mmol/L — ABNORMAL HIGH (ref 20.0–28.0)
Bicarbonate: 33.9 mmol/L — ABNORMAL HIGH (ref 20.0–28.0)
O2 Saturation: 100 %
O2 Saturation: 100 %
O2 Saturation: 100 %
O2 Saturation: 100 %
O2 Saturation: 100 %
O2 Saturation: 99 %
Patient temperature: 99
Patient temperature: 98.9
Patient temperature: 98.8
Patient temperature: 99.2
TCO2: 36 mmol/L — ABNORMAL HIGH (ref 22–32)
TCO2: 34 mmol/L — ABNORMAL HIGH (ref 22–32)
TCO2: 32 mmol/L (ref 22–32)
TCO2: 36 mmol/L — ABNORMAL HIGH (ref 22–32)
TCO2: 38 mmol/L — ABNORMAL HIGH (ref 22–32)
TCO2: 35 mmol/L — ABNORMAL HIGH (ref 22–32)
pCO2 arterial: 48.3 mmHg — ABNORMAL HIGH (ref 32.0–48.0)
pCO2 arterial: 49.3 mmHg — ABNORMAL HIGH (ref 32.0–48.0)
pCO2 arterial: 41.6 mmHg (ref 32.0–48.0)
pCO2 arterial: 47.7 mmHg (ref 32.0–48.0)
pCO2 arterial: 48 mmHg (ref 32.0–48.0)
pCO2 arterial: 44.6 mmHg (ref 32.0–48.0)
pH, Arterial: 7.463 — ABNORMAL HIGH (ref 7.350–7.450)
pH, Arterial: 7.433 (ref 7.350–7.450)
pH, Arterial: 7.476 — ABNORMAL HIGH (ref 7.350–7.450)
pH, Arterial: 7.474 — ABNORMAL HIGH (ref 7.350–7.450)
pH, Arterial: 7.491 — ABNORMAL HIGH (ref 7.350–7.450)
pH, Arterial: 7.49 — ABNORMAL HIGH (ref 7.350–7.450)
pO2, Arterial: 256 mmHg — ABNORMAL HIGH (ref 83.0–108.0)
pO2, Arterial: 213 mmHg — ABNORMAL HIGH (ref 83.0–108.0)
pO2, Arterial: 198 mmHg — ABNORMAL HIGH (ref 83.0–108.0)
pO2, Arterial: 194 mmHg — ABNORMAL HIGH (ref 83.0–108.0)
pO2, Arterial: 322 mmHg — ABNORMAL HIGH (ref 83.0–108.0)
pO2, Arterial: 109 mmHg — ABNORMAL HIGH (ref 83.0–108.0)

## 2016-11-26 LAB — TROPONIN I: Troponin I: 0.09 ng/mL (ref <0.03)

## 2016-11-26 LAB — PREPARE PLATELET PHERESIS: Unit division: 0

## 2016-11-26 LAB — BLOOD GAS, ARTERIAL
Acid-Base Excess: 5 mmol/L — ABNORMAL HIGH (ref 0.0–2.0)
Bicarbonate: 28.7 mmol/L — ABNORMAL HIGH (ref 20.0–28.0)
Drawn by: 252031
FIO2: 0.7
MECHVT: 430 mL
O2 Saturation: 99.8 %
PEEP: 5 cmH2O
Patient temperature: 98.6
RATE: 12 resp/min
pCO2 arterial: 40.7 mmHg (ref 32.0–48.0)
pH, Arterial: 7.462 — ABNORMAL HIGH (ref 7.350–7.450)
pO2, Arterial: 294 mmHg — ABNORMAL HIGH (ref 83.0–108.0)

## 2016-11-26 LAB — CBC
HCT: 35.2 % — ABNORMAL LOW (ref 39.0–52.0)
Hemoglobin: 15.5 g/dL (ref 13.0–17.0)
MCH: 36.4 pg — ABNORMAL HIGH (ref 26.0–34.0)
MCHC: 44 g/dL — ABNORMAL HIGH (ref 30.0–36.0)
MCV: 82.6 fL (ref 78.0–100.0)
Platelets: 199 10*3/uL (ref 150–400)
RBC: 4.26 MIL/uL (ref 4.22–5.81)
RDW: 13.5 % (ref 11.5–15.5)
WBC: 14.7 10*3/uL — ABNORMAL HIGH (ref 4.0–10.5)

## 2016-11-26 LAB — MRSA PCR SCREENING: MRSA by PCR: NEGATIVE

## 2016-11-26 LAB — APTT
aPTT: 32 seconds (ref 24–36)
aPTT: 70 seconds — ABNORMAL HIGH (ref 24–36)

## 2016-11-26 LAB — BPAM PLATELET PHERESIS
Blood Product Expiration Date: 201811142359
ISSUE DATE / TIME: 201811141211
Unit Type and Rh: 8400

## 2016-11-26 LAB — BPAM FFP
Blood Product Expiration Date: 201811142359
Blood Product Expiration Date: 201811172359
ISSUE DATE / TIME: 201811141211
ISSUE DATE / TIME: 201811141211
Unit Type and Rh: 6200
Unit Type and Rh: 8400

## 2016-11-26 LAB — TRIGLYCERIDES
Triglycerides: 75 mg/dL (ref <150)
Triglycerides: >5000 mg/dL — ABNORMAL HIGH (ref <150)

## 2016-11-26 LAB — GLUCOSE, CAPILLARY
Glucose-Capillary: 168 mg/dL — ABNORMAL HIGH (ref 65–99)
Glucose-Capillary: 148 mg/dL — ABNORMAL HIGH (ref 65–99)

## 2016-11-26 LAB — PHOSPHORUS
Phosphorus: 2.6 mg/dL (ref 2.5–4.6)
Phosphorus: 2.2 mg/dL — ABNORMAL LOW (ref 2.5–4.6)

## 2016-11-26 LAB — CG4 I-STAT (LACTIC ACID): Lactic Acid, Venous: 2.24 mmol/L (ref 0.5–1.9)

## 2016-11-26 LAB — PROTIME-INR
INR: 1.24
INR: 2.08
Prothrombin Time: 15.5 seconds — ABNORMAL HIGH (ref 11.4–15.2)
Prothrombin Time: 23.2 seconds — ABNORMAL HIGH (ref 11.4–15.2)

## 2016-11-26 LAB — MAGNESIUM
Magnesium: 2.1 mg/dL (ref 1.7–2.4)
Magnesium: 1.7 mg/dL (ref 1.7–2.4)

## 2016-11-26 LAB — HIV ANTIBODY (ROUTINE TESTING W REFLEX): HIV Screen 4th Generation wRfx: NONREACTIVE

## 2016-11-26 LAB — PREPARE RBC (CROSSMATCH)

## 2016-11-26 SURGERY — INSERTION, CANNULA, FOR ECMO, AT BEDSIDE
Anesthesia: General | Site: Groin

## 2016-11-26 MED ORDER — FAT EMULSION 20 % IV EMUL
3000.0000 mL | INTRAVENOUS | Status: DC
  Administered 2016-11-26 (×2): 3000 mL via INTRAVENOUS
  Filled 2016-11-26: qty 3000

## 2016-11-26 MED ORDER — BIVALIRUDIN LOAD/BOLUS VIA INFUSION (OHS/POSSIBLE HIT) OPTIME
1.0000 mg/kg | INTRAVENOUS | Status: DC
  Filled 2016-11-26: qty 110

## 2016-11-26 MED ORDER — POTASSIUM CHLORIDE 10 MEQ/100ML IV SOLN: 10.0000 meq | INTRAVENOUS | Status: DC

## 2016-11-26 MED ORDER — BIVALIRUDIN BOLUS VIA INFUSION
0.5000 mg/kg | Freq: Once | INTRAVENOUS | Status: AC
  Administered 2016-11-26: 40 mg via INTRAVENOUS
  Filled 2016-11-26: qty 55

## 2016-11-26 MED ORDER — SODIUM CHLORIDE 0.9 % IV SOLN
INTRAVENOUS | Status: DC | PRN
  Administered 2016-11-26: 11:00:00 via INTRA_ARTERIAL

## 2016-11-26 MED ORDER — PLASMA-LYTE 148 IV SOLN
INTRAVENOUS | Status: DC
  Filled 2016-11-26: qty 1

## 2016-11-26 MED ORDER — SODIUM BICARBONATE 8.4 % IV SOLN
100.0000 meq | Freq: Once | INTRAVENOUS | Status: AC
Start: 2016-11-26 — End: 2016-11-25
  Administered 2016-11-25: 100 meq via INTRAVENOUS

## 2016-11-26 MED ORDER — MIDAZOLAM HCL 5 MG/5ML IJ SOLN
INTRAMUSCULAR | Status: DC | PRN
  Administered 2016-11-26: 3 mg via INTRAVENOUS
  Administered 2016-11-26: 1 mg via INTRAVENOUS

## 2016-11-26 MED ORDER — SODIUM BICARBONATE 8.4 % IV SOLN
INTRAVENOUS | Status: AC
  Administered 2016-11-26: 100 meq via INTRAVENOUS
  Filled 2016-11-26: qty 100

## 2016-11-26 MED ORDER — FAT EMULSION 20 % IV EMUL
4950.0000 mL | INTRAVENOUS | Status: DC
  Filled 2016-11-26 (×2): qty 5000

## 2016-11-26 MED ORDER — POTASSIUM CHLORIDE 10 MEQ/100ML IV SOLN
10.0000 meq | INTRAVENOUS | Status: AC
  Administered 2016-11-26 (×4): 10 meq via INTRAVENOUS
  Filled 2016-11-26 (×4): qty 100

## 2016-11-26 MED ORDER — MIDAZOLAM HCL 2 MG/2ML IJ SOLN
INTRAMUSCULAR | Status: AC
  Filled 2016-11-26: qty 4

## 2016-11-26 MED ORDER — ORAL CARE MOUTH RINSE
15.0000 mL | Freq: Four times a day (QID) | OROMUCOSAL | Status: DC
  Administered 2016-11-26: 15 mL via OROMUCOSAL

## 2016-11-26 MED ORDER — CHLORHEXIDINE GLUCONATE 0.12% ORAL RINSE (MEDLINE KIT)
15.0000 mL | Freq: Two times a day (BID) | OROMUCOSAL | Status: DC
  Administered 2016-11-26: 15 mL via OROMUCOSAL

## 2016-11-26 MED ORDER — CALCIUM CHLORIDE 10 % IV SOLN
INTRAVENOUS | Status: DC | PRN
  Administered 2016-11-26: 1 g via INTRAVENOUS

## 2016-11-26 MED ORDER — SODIUM CHLORIDE 0.9 % IV SOLN
2.5000 mg/kg/h | INTRAVENOUS | Status: DC
  Filled 2016-11-26: qty 250

## 2016-11-26 MED ORDER — CALCIUM CHLORIDE 10 % IV SOLN
INTRAVENOUS | Status: AC
  Filled 2016-11-26: qty 10

## 2016-11-26 MED ORDER — SODIUM CHLORIDE 0.9 % IV SOLN
0.0500 mg/kg/h | INTRAVENOUS | Status: DC
  Administered 2016-11-26: .05 mg/kg/h via INTRAVENOUS
  Filled 2016-11-26 (×2): qty 250

## 2016-11-26 MED ORDER — ROCURONIUM BROMIDE 10 MG/ML (PF) SYRINGE
PREFILLED_SYRINGE | INTRAVENOUS | Status: DC | PRN
  Administered 2016-11-26 (×3): 50 mg via INTRAVENOUS

## 2016-11-26 MED ORDER — SODIUM CHLORIDE 0.9 % IV SOLN
Freq: Once | INTRAVENOUS | Status: AC
  Administered 2016-11-26: 10:00:00 via INTRAVENOUS

## 2016-11-26 MED ORDER — SODIUM BICARBONATE 8.4 % IV SOLN
100.0000 meq | Freq: Once | INTRAVENOUS | Status: AC
  Administered 2016-11-26: 100 meq via INTRAVENOUS

## 2016-11-26 MED ORDER — INSULIN ASPART 100 UNIT/ML ~~LOC~~ SOLN
0.0000 [IU] | SUBCUTANEOUS | Status: DC
  Administered 2016-11-26: 3 [IU] via SUBCUTANEOUS
  Administered 2016-11-26: 1 [IU] via SUBCUTANEOUS

## 2016-11-26 MED ORDER — PROPOFOL 10 MG/ML IV BOLUS
INTRAVENOUS | Status: AC
  Filled 2016-11-26: qty 20

## 2016-11-26 MED ORDER — EPHEDRINE SULFATE 50 MG/ML IJ SOLN
INTRAMUSCULAR | Status: DC | PRN
  Administered 2016-11-26 (×2): 10 mg via INTRAVENOUS
  Administered 2016-11-26: 5 mg via INTRAVENOUS

## 2016-11-26 MED ORDER — FAT EMULSION 20 % IV EMUL
4950.0000 mL | INTRAVENOUS | Status: DC
Start: 2016-11-26 — End: 2016-11-26
  Filled 2016-11-26 (×4): qty 5000

## 2016-11-26 MED ORDER — LACTATED RINGERS IV SOLN
INTRAVENOUS | Status: DC | PRN
  Administered 2016-11-26: 12:00:00 via INTRAVENOUS

## 2016-11-26 MED ORDER — FENTANYL CITRATE (PF) 100 MCG/2ML IJ SOLN
INTRAMUSCULAR | Status: DC | PRN
Start: 2016-11-26 — End: 2016-11-26
  Administered 2016-11-26 (×2): 150 ug via INTRAVENOUS
  Administered 2016-11-26 (×2): 100 ug via INTRAVENOUS

## 2016-11-26 MED ORDER — CEFUROXIME SODIUM 750 MG IJ SOLR: 750.0000 mg | Freq: Three times a day (TID) | INTRAMUSCULAR | Status: DC

## 2016-11-26 MED ORDER — HEMOSTATIC AGENTS (NO CHARGE) OPTIME
TOPICAL | Status: DC | PRN
  Administered 2016-11-26: 1 via TOPICAL

## 2016-11-26 MED ORDER — FENTANYL CITRATE (PF) 250 MCG/5ML IJ SOLN
INTRAMUSCULAR | Status: AC
  Filled 2016-11-26: qty 5

## 2016-11-26 MED ORDER — BIVALIRUDIN BOLUS VIA INFUSION
0.5000 mg/kg | Freq: Once | INTRAVENOUS | Status: DC
  Filled 2016-11-26: qty 55

## 2016-11-26 MED ORDER — POTASSIUM CHLORIDE 10 MEQ/50ML IV SOLN: 10.0000 meq | INTRAVENOUS | Status: DC

## 2016-11-26 MED ORDER — FAT EMULSION 20 % IV EMUL: 1000.0000 mL | INTRAVENOUS | Status: DC

## 2016-11-26 MED ORDER — POTASSIUM CHLORIDE 10 MEQ/100ML IV SOLN
10.0000 meq | INTRAVENOUS | Status: AC
  Administered 2016-11-26 (×3): 10 meq via INTRAVENOUS
  Filled 2016-11-26 (×3): qty 100

## 2016-11-26 MED ORDER — NICARDIPINE HCL IN NACL 20-0.86 MG/200ML-% IV SOLN
3.0000 mg/h | INTRAVENOUS | Status: DC
  Filled 2016-11-26: qty 200

## 2016-11-26 MED ORDER — EPINEPHRINE PF 1 MG/ML IJ SOLN
0.5000 ug/min | Freq: Once | INTRAVENOUS | Status: DC
  Filled 2016-11-26: qty 4

## 2016-11-26 MED ORDER — POTASSIUM CHLORIDE 20 MEQ/15ML (10%) PO SOLN
40.0000 meq | Freq: Every day | ORAL | Status: DC
  Filled 2016-11-26: qty 30

## 2016-11-26 MED ORDER — MAGNESIUM SULFATE 4 GM/100ML IV SOLN
4.0000 g | Freq: Once | INTRAVENOUS | Status: AC
  Administered 2016-11-26: 4 g via INTRAVENOUS
  Filled 2016-11-26: qty 100

## 2016-11-26 MED ORDER — PANTOPRAZOLE SODIUM 40 MG IV SOLR
40.0000 mg | INTRAVENOUS | Status: DC
  Administered 2016-11-26: 40 mg via INTRAVENOUS
  Filled 2016-11-26: qty 40

## 2016-11-26 MED ORDER — PROPOFOL 10 MG/ML IV BOLUS
INTRAVENOUS | Status: DC | PRN
  Administered 2016-11-26: 30 mg via INTRAVENOUS

## 2016-11-26 MED ORDER — 0.9 % SODIUM CHLORIDE (POUR BTL) OPTIME
TOPICAL | Status: DC | PRN
  Administered 2016-11-26: 5000 mL

## 2016-11-26 MED ORDER — ROCURONIUM BROMIDE 10 MG/ML (PF) SYRINGE
PREFILLED_SYRINGE | INTRAVENOUS | Status: AC
  Filled 2016-11-26: qty 10

## 2016-11-26 MED ORDER — FAT EMULSION 20 % IV EMUL
3000.0000 mL | INTRAVENOUS | Status: DC
  Administered 2016-11-26: 3000 mL via INTRAVENOUS
  Filled 2016-11-26 (×5): qty 3000

## 2016-11-26 MED ORDER — CEFUROXIME SODIUM 1.5 G IV SOLR
1.5000 g | INTRAVENOUS | Status: AC
  Administered 2016-11-26: 1.5 g via INTRAVENOUS
  Filled 2016-11-26: qty 1.5

## 2016-11-26 MED ORDER — LACTATED RINGERS IV SOLN
INTRAVENOUS | Status: DC | PRN
  Administered 2016-11-26: 11:00:00 via INTRAVENOUS

## 2016-11-26 MED ORDER — SODIUM CHLORIDE 0.9 % IV SOLN
0.0500 mg/kg/h | INTRAVENOUS | Status: DC
  Filled 2016-11-26: qty 250

## 2016-11-26 MED ORDER — PROPOFOL 1000 MG/100ML IV EMUL
5.0000 ug/kg/min | INTRAVENOUS | Status: DC
  Administered 2016-11-26: 10 ug/kg/min via INTRAVENOUS
  Filled 2016-11-26 (×3): qty 100

## 2016-11-26 MED FILL — Medication: Qty: 1 | Status: AC

## 2016-11-26 SURGICAL SUPPLY — 113 items
ADAPTER CARDIO PERF ANTE/RETRO (ADAPTER) ×4 IMPLANT
ADPR PRFSN 84XANTGRD RTRGD (ADAPTER) ×2
BAG DECANTER FOR FLEXI CONT (MISCELLANEOUS) ×4 IMPLANT
BANDAGE ACE 4X5 VEL STRL LF (GAUZE/BANDAGES/DRESSINGS) ×4 IMPLANT
BANDAGE ACE 6X5 VEL STRL LF (GAUZE/BANDAGES/DRESSINGS) ×8 IMPLANT
BASKET HEART  (ORDER IN 25'S) (MISCELLANEOUS) ×1
BASKET HEART (ORDER IN 25'S) (MISCELLANEOUS) ×1
BASKET HEART (ORDER IN 25S) (MISCELLANEOUS) ×2 IMPLANT
BLADE CLIPPER SURG (BLADE) IMPLANT
BLADE STERNUM SYSTEM 6 (BLADE) ×4 IMPLANT
BLADE SURG 12 STRL SS (BLADE) ×4 IMPLANT
BNDG GAUZE ELAST 4 BULKY (GAUZE/BANDAGES/DRESSINGS) ×4 IMPLANT
CANISTER SUCT 3000ML PPV (MISCELLANEOUS) ×4 IMPLANT
CANNULA EZ GLIDE 8.0 24FR (CANNULA) ×2 IMPLANT
CANNULA GUNDRY RCSP 15FR (MISCELLANEOUS) ×4 IMPLANT
CANNULA VESSEL 3MM 2 BLNT TIP (CANNULA) ×2 IMPLANT
CATH CPB KIT VANTRIGT (MISCELLANEOUS) ×4 IMPLANT
CATH ROBINSON RED A/P 18FR (CATHETERS) ×12 IMPLANT
CATH THORACIC 36FR RT ANG (CATHETERS) ×4 IMPLANT
CRADLE DONUT ADULT HEAD (MISCELLANEOUS) ×4 IMPLANT
DRAIN CHANNEL 32F RND 10.7 FF (WOUND CARE) ×4 IMPLANT
DRAPE CARDIOVASCULAR INCISE (DRAPES) ×4
DRAPE CV SPLIT W-CLR ANES SCRN (DRAPES) ×2 IMPLANT
DRAPE INCISE IOBAN 66X45 STRL (DRAPES) ×2 IMPLANT
DRAPE PERI GROIN 82X75IN TIB (DRAPES) ×2 IMPLANT
DRAPE SLUSH/WARMER DISC (DRAPES) ×4 IMPLANT
DRAPE SRG 135X102X78XABS (DRAPES) ×2 IMPLANT
DRSG AQUACEL AG ADV 3.5X14 (GAUZE/BANDAGES/DRESSINGS) ×4 IMPLANT
DRSG SORBAVIEW 3.5X5-5/16 MED (GAUZE/BANDAGES/DRESSINGS) ×4 IMPLANT
DRSG TEGADERM 4X4.75 (GAUZE/BANDAGES/DRESSINGS) ×4 IMPLANT
ELECT BLADE 4.0 EZ CLEAN MEGAD (MISCELLANEOUS) ×4
ELECT BLADE 6.5 EXT (BLADE) ×4 IMPLANT
ELECT CAUTERY BLADE 6.4 (BLADE) ×6 IMPLANT
ELECT REM PT RETURN 9FT ADLT (ELECTROSURGICAL) ×8
ELECTRODE BLDE 4.0 EZ CLN MEGD (MISCELLANEOUS) ×2 IMPLANT
ELECTRODE REM PT RTRN 9FT ADLT (ELECTROSURGICAL) ×4 IMPLANT
FELT TEFLON 1X6 (MISCELLANEOUS) ×10 IMPLANT
GAUZE SPONGE 4X4 12PLY STRL (GAUZE/BANDAGES/DRESSINGS) ×10 IMPLANT
GLOVE BIO SURGEON STRL SZ 6.5 (GLOVE) ×4 IMPLANT
GLOVE BIO SURGEON STRL SZ7 (GLOVE) ×2 IMPLANT
GLOVE BIO SURGEON STRL SZ7.5 (GLOVE) ×12 IMPLANT
GLOVE BIO SURGEONS STRL SZ 6.5 (GLOVE) ×4
GLOVE BIOGEL PI IND STRL 6.5 (GLOVE) IMPLANT
GLOVE BIOGEL PI IND STRL 7.0 (GLOVE) IMPLANT
GLOVE BIOGEL PI IND STRL 9 (GLOVE) IMPLANT
GLOVE BIOGEL PI INDICATOR 6.5 (GLOVE) ×4
GLOVE BIOGEL PI INDICATOR 7.0 (GLOVE) ×6
GLOVE BIOGEL PI INDICATOR 9 (GLOVE) ×2
GOWN STRL REUS W/ TWL LRG LVL3 (GOWN DISPOSABLE) ×8 IMPLANT
GOWN STRL REUS W/ TWL XL LVL3 (GOWN DISPOSABLE) IMPLANT
GOWN STRL REUS W/TWL LRG LVL3 (GOWN DISPOSABLE) ×20
GOWN STRL REUS W/TWL XL LVL3 (GOWN DISPOSABLE) ×4
GRAFT GELWEAVE IMPREG 10X30CM (Prosthesis & Implant Heart) ×2 IMPLANT
HEMOSTAT POWDER SURGIFOAM 1G (HEMOSTASIS) ×12 IMPLANT
HEMOSTAT SURGICEL 2X14 (HEMOSTASIS) ×4 IMPLANT
INSERT FOGARTY XLG (MISCELLANEOUS) IMPLANT
KIT BASIN OR (CUSTOM PROCEDURE TRAY) ×4 IMPLANT
KIT DILATOR VASC 18G NDL (KITS) ×2 IMPLANT
KIT ROOM TURNOVER OR (KITS) ×4 IMPLANT
KIT SUCTION CATH 14FR (SUCTIONS) ×4 IMPLANT
KIT VASOVIEW HEMOPRO VH 3000 (KITS) ×4 IMPLANT
LEAD PACING MYOCARDI (MISCELLANEOUS) ×4 IMPLANT
LOOP VESSEL MAXI BLUE (MISCELLANEOUS) ×2 IMPLANT
MARKER GRAFT CORONARY BYPASS (MISCELLANEOUS) ×12 IMPLANT
NS IRRIG 1000ML POUR BTL (IV SOLUTION) ×20 IMPLANT
PACK OPEN HEART (CUSTOM PROCEDURE TRAY) ×4 IMPLANT
PAD ARMBOARD 7.5X6 YLW CONV (MISCELLANEOUS) ×8 IMPLANT
PAD ELECT DEFIB RADIOL ZOLL (MISCELLANEOUS) ×4 IMPLANT
PENCIL BUTTON HOLSTER BLD 10FT (ELECTRODE) ×4 IMPLANT
PUNCH AORTIC ROTATE 4.0MM (MISCELLANEOUS) IMPLANT
PUNCH AORTIC ROTATE 4.5MM 8IN (MISCELLANEOUS) IMPLANT
PUNCH AORTIC ROTATE 5MM 8IN (MISCELLANEOUS) IMPLANT
SET MICROPUNCTURE 5F STIFF (MISCELLANEOUS) ×2 IMPLANT
SHEATH PINNACLE 5F 10CM (SHEATH) ×2 IMPLANT
STAPLER VISISTAT 35W (STAPLE) ×2 IMPLANT
SURGIFLO W/THROMBIN 8M KIT (HEMOSTASIS) ×4 IMPLANT
SUT BONE WAX W31G (SUTURE) ×4 IMPLANT
SUT MNCRL AB 4-0 PS2 18 (SUTURE) IMPLANT
SUT PROLENE 3 0 SH DA (SUTURE) IMPLANT
SUT PROLENE 3 0 SH1 36 (SUTURE) IMPLANT
SUT PROLENE 4 0 RB 1 (SUTURE) ×12
SUT PROLENE 4 0 SH DA (SUTURE) ×4 IMPLANT
SUT PROLENE 4-0 RB1 .5 CRCL 36 (SUTURE) ×2 IMPLANT
SUT PROLENE 5 0 C 1 36 (SUTURE) ×10 IMPLANT
SUT PROLENE 6 0 C 1 30 (SUTURE) IMPLANT
SUT PROLENE 6 0 CC (SUTURE) ×6 IMPLANT
SUT PROLENE 8 0 BV175 6 (SUTURE) IMPLANT
SUT PROLENE BLUE 7 0 (SUTURE) ×2 IMPLANT
SUT SILK  1 MH (SUTURE) ×12
SUT SILK 1 MH (SUTURE) IMPLANT
SUT SILK 1 TIES 10X30 (SUTURE) ×2 IMPLANT
SUT SILK 2 0 SH CR/8 (SUTURE) ×2 IMPLANT
SUT SILK 2 0 TIES 10X30 (SUTURE) ×2 IMPLANT
SUT SILK 3 0 SH CR/8 (SUTURE) IMPLANT
SUT SILK 3 0 TIES 10X30 (SUTURE) ×2 IMPLANT
SUT STEEL 6MS V (SUTURE) ×8 IMPLANT
SUT STEEL SZ 6 DBL 3X14 BALL (SUTURE) ×4 IMPLANT
SUT VIC AB 1 CTX 36 (SUTURE) ×8
SUT VIC AB 1 CTX36XBRD ANBCTR (SUTURE) ×4 IMPLANT
SUT VIC AB 2-0 CT1 27 (SUTURE) ×4
SUT VIC AB 2-0 CT1 TAPERPNT 27 (SUTURE) IMPLANT
SUT VIC AB 2-0 CTX 27 (SUTURE) IMPLANT
SUT VIC AB 3-0 X1 27 (SUTURE) IMPLANT
SUTURE E-PAK OPEN HEART (SUTURE) ×4 IMPLANT
SYSTEM SAHARA CHEST DRAIN ATS (WOUND CARE) ×4 IMPLANT
TOWEL GREEN STERILE (TOWEL DISPOSABLE) ×16 IMPLANT
TOWEL GREEN STERILE FF (TOWEL DISPOSABLE) ×8 IMPLANT
TOWEL OR 17X24 6PK STRL BLUE (TOWEL DISPOSABLE) ×4 IMPLANT
TOWEL OR 17X26 10 PK STRL BLUE (TOWEL DISPOSABLE) ×8 IMPLANT
TRAY FOLEY SILVER 16FR TEMP (SET/KITS/TRAYS/PACK) ×4 IMPLANT
TUBING INSUFFLATION (TUBING) ×4 IMPLANT
UNDERPAD 30X30 (UNDERPADS AND DIAPERS) ×4 IMPLANT
WATER STERILE IRR 1000ML POUR (IV SOLUTION) ×8 IMPLANT

## 2016-11-26 NOTE — Progress Notes (Signed)
Lab notified RN that they are unable to complete HIT panel because of extremely high lipid content of blood.   Will notify Dr Nils Pyle.
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
## 2016-11-26 NOTE — Procedures (Signed)
Intubation Procedure Note [name] [name] [id number] 1960-11-22  Procedure: Intubation Indications: Airway protection and maintenance  Procedure Details Consent: Risks of procedure as well as the alternatives and risks of each were explained to the (patient/caregiver).  Consent for procedure obtained. Time Out: Verified patient identification, verified procedure, site/side was marked, verified correct patient position, special equipment/implants available, medications/allergies/relevent history reviewed, required imaging and test results available.  Performed  Maximum sterile technique was used including gloves, hand hygiene and mask.  MAC and 4 ETT 7.5 cuffed Versed and Fentanyl used    Evaluation Hemodynamic Status: BP stable throughout; O2 sats: stable throughout Patient's Current Condition: stable Complications: No apparent complications Patient did tolerate procedure well. Chest X-ray ordered to verify placement.  CXR: tube position acceptable.   [name] [name] [name] [name] 11/26/2016

## 2016-11-26 NOTE — Progress Notes (Signed)
Interim note:   Called and discussed case with toxicologist on call. Given recent need to defib 2/2 vtach with [place], will restart lipid infusion (no bolus per her orders) and call [facility] to begin transfer process for Ecmo. Toxicologist asked for fax of last EKG and strip immediately prior to last shock. Faxed these to [phone].   Called [facility] ECMO team. They will accept the patient. They asked that I call CVTS here to plan for OR cannulation, and they will coordinate with the CVTS surgeon to be present during the OR case. I have paged Dr. Roxan Hockey, who is on call for CVTS. We will touch base with [facility] again once I coordinate with Dr. Roxan Hockey. The ecmo team can be reached at [phone].   Ralene Ok, MD

## 2016-11-26 NOTE — Progress Notes (Signed)
CCM MD notified toxicologist of pt current status.  Q1H EKG's continued.

## 2016-11-26 NOTE — Progress Notes (Signed)
  Echocardiogram Echocardiogram Transesophageal has been performed.  Darlina Sicilian M 11/26/2016, 12:48 PM

## 2016-11-26 NOTE — Progress Notes (Signed)
Patient brief 90 second VT arrest this AM, with rosc attained after defibrillation. Lipid infusion had been stopped overnight; after discussion with poison control this AM, will restart the lipid infusion. Magnesium slightly low at 1.7 this AM; will replete with 4g Magnesium sulfate IV. Patient awake/alert despite fentanyl gtt at 416mcg and Versed gtt at 4mg /hr. Will start Propofol gtt as low rate for sedation/ETT tolerance.   Poison control is recommending patient be started on ECMO. Discussed directly with Toxicologist MD from Poison control. ICU housestaff has contacted [facility] to request transfer for ECMO and is currently discussing with [facility] CT Surgery to plan for cannulation.   Dr Halford Chessman is taking over care of this patient shortly. Will sign out patient case to him.   Vernie Murders, MD Pulmonary & Critical Care Medicine Pager: [phone]

## 2016-11-26 NOTE — Progress Notes (Signed)
Pt taken off ventilator and manually ventilated to OR for cannulation. Report given to CRNA

## 2016-11-26 NOTE — Progress Notes (Signed)
[place] Progress Note Patient Name: [name] [name] DOB: 1961-01-01 MRN: [id number]   Date of Service  11/26/2016  HPI/Events of Note  Notified of need for stress ulcer prophylaxis.   eICU Interventions  Will order Protonix IV.      Intervention Category Intermediate Interventions: Best-practice therapies (e.g. DVT, beta blocker, etc.)  Lee Kuang [place] 11/26/2016, 12:48 AM

## 2016-11-26 NOTE — Progress Notes (Addendum)
Pt now in rate controlled afib. 12 lead obtained

## 2016-11-26 NOTE — Progress Notes (Signed)
CCM UPDATE  Post push of 2 amps of bicarb QRS improved to 134 from 144 ms on previous EKG Increased rate on Bicarb ggt.   Signed Dr Seward Carol Pulmonary Critical Care Locums

## 2016-11-26 NOTE — Transfer of Care (Signed)
Immediate Anesthesia Transfer of Care Note  Patient: [name] [name]  Procedure(s) Performed: CANNULATION FOR  [facility] ECMO (EXTRACORPOREAL MEMBRANE OXYGENATION) BEDSIDE (Bilateral Groin) INTRAOPERATIVE TRANSESOPHAGEAL ECHOCARDIOGRAM (N/A )  Patient Location: [facility] transport team   Anesthesia Type:General  Level of Consciousness: sedated, unresponsive and Patient remains intubated per anesthesia plan  Airway & Oxygen Therapy: Patient remains intubated per anesthesia plan and Patient placed on Ventilator (see vital sign flow sheet for setting)  Post-op Assessment: Report given to RN and Post -op Vital signs reviewed and stable  Post vital signs: Reviewed and stable  Last Vitals:  Vitals:   11/26/16 1030 11/26/16 1100  BP:  (!) 117/57  Pulse: 64 (!) 59  Resp: 12 13  Temp:    SpO2: 98% 98%    Last Pain:  Vitals:   11/26/16 0810  TempSrc: Oral  PainSc:          Complications: No apparent anesthesia complications

## 2016-11-26 NOTE — Progress Notes (Signed)
Interim Note:   Discussed case again with Toxicologist for update. Relayed that patient has not had any more arrests since last conversation, and is currently preparing for OR. Lipids running at 69ml/hr. Patient was discussed at toxicology case conference today, and toxicology recommendations include the following should he become unstable:  -overdrive pacing  -hypertonic saline (3% at 250cc) at TBI dosing -lidocaine IV  Poison control states they will check in on him at [facility] after transfer.   Ralene Ok, MD

## 2016-11-26 NOTE — Brief Op Note (Signed)
11/26/2016  3:41 PM  PATIENT:  [name] [name]  56 y.o. male  PRE-OPERATIVE DIAGNOSIS:  Ventricular tachycardia  POST-OPERATIVE DIAGNOSIS:  Ventricular tachycardia  PROCEDURE:  Procedure(s) with comments: [place] ECMO (EXTRACORPOREAL MEMBRANE OXYGENATION) BEDSIDE (Bilateral) - Assisting Jamison Neighbor, MD from [place] TRANSESOPHAGEAL ECHOCARDIOGRAM (N/A)  SURGEON:  Surgeon(s) and Role:    Ivin Poot, MD - Primary  PHYSICIAN ASSISTANT: none  ASSISTANTS: none   ANESTHESIA:   general  EBL:  100 mL   BLOOD ADMINISTERED:none  DRAINS: none   LOCAL MEDICATIONS USED:  NONE  SPECIMEN:  No Specimen  DISPOSITION OF SPECIMEN:  N/A  COUNTS:  YES  TOURNIQUET:  * No tourniquets in log *  DICTATION: .Dragon Dictation  PLAN OF CARE: patient transfered to [facility] by ECMO transport team  PATIENT DISPOSITION: [place]  CT surgery ICU   Delay start of Pharmacological VTE agent (>24hrs) due to surgical blood loss or risk of bleeding: yes

## 2016-11-26 NOTE — Progress Notes (Signed)
eLink Physician-Brief Progress Note Patient Name: NAKOTA ELSEN DOB: Jul 10, 1960 MRN: [id number]   Date of Service  11/26/2016  HPI/Events of Note  Patient on AC/HS Novolog SSI. Now intubated, ventilated and NPO.   eICU Interventions  Will change to Q 4 hour sensitive Novolog SSI.     Intervention Category Major Interventions: Hyperglycemia - active titration of insulin therapy  Tavarion Babington Eugene 11/26/2016, 2:36 AM

## 2016-11-26 NOTE — Anesthesia Preprocedure Evaluation (Signed)
Anesthesia Evaluation  Patient identified by MRN, date of birth, ID band Patient awake    Reviewed: Allergy & Precautions, NPO status , Patient's Chart, lab work & pertinent test results  Airway Mallampati: Intubated       Dental   Pulmonary sleep apnea ,    Pulmonary exam normal        Cardiovascular hypertension, Pt. on medications Normal cardiovascular exam     Neuro/Psych Depression Bipolar Disorder    GI/Hepatic   Endo/Other  diabetes, Type 2, Insulin Dependent  Renal/GU      Musculoskeletal   Abdominal   Peds  Hematology   Anesthesia Other Findings   Reproductive/Obstetrics                             Anesthesia Physical Anesthesia Plan  ASA: IV  Anesthesia Plan: General   Post-op Pain Management:    Induction: Intravenous  PONV Risk Score and Plan: 2 and Treatment may vary due to age or medical condition  Airway Management Planned: Oral ETT  Additional Equipment:   Intra-op Plan:   Post-operative Plan: Post-operative intubation/ventilation  Informed Consent: I have reviewed the patients History and Physical, chart, labs and discussed the procedure including the risks, benefits and alternatives for the proposed anesthesia with the patient or authorized representative who has indicated his/her understanding and acceptance.     Plan Discussed with: CRNA and Surgeon  Anesthesia Plan Comments:         Anesthesia Quick Evaluation

## 2016-11-26 NOTE — Progress Notes (Signed)
eLink Physician-Brief Progress Note Patient Name: [name] [name] DOB: Mar 02, 1960 MRN: [id number]   Date of Service  11/26/2016  HPI/Events of Note  Hypertension - BP = 180/69. Currently on Hydralazine 10-40 mg IV Q 4 hours PRN.   eICU Interventions  Will order: 1. Nicardipine IV infusion. Titrate to SBP < 160.     Intervention Category Major Interventions: Hypertension - evaluation and management  [name] 11/26/2016, 5:37 AM

## 2016-11-26 NOTE — Progress Notes (Signed)
CCM update  Pt had multiple episodes of Vtach requiring defibrillation despite intiation of bicarb ggt and Hypertonic saline bolus.  EKG at 11/25/16 23:40 QRS 170ms and QTc 566 Last defibrillation for sustained VT was on 11/25/16 at 23:49 Intralipid bolus started at 11/26/16 00:00 EKG 11/26/16 at 00:10  QRS 130 ms and QTc 621 ABG: 7.462/40.7/294/28  He continued to have NSVT but spontaneously broke into sinus. Hypertonic saline completed on 11/26/16 at 1:05 AM Next EKG, CMP, Mg Phos and ABG due  at 1:15 AM  Goal of treatment is to get QRS<126ms and QTc normalize  Lines:  PIV Left AC and Right wrist and Left wrist RIJ Triple lumen catheter Left radial A- line  Catheter: Indwelling foley UOP- 1500cc  [place] Poison control [phone] has been updated at regular intervals.  Signed Dr Seward Carol [facility] Loc[facility]

## 2016-11-26 NOTE — Progress Notes (Signed)
[facility] Progress Note Patient Name: JASTIN FORE DOB: 07-17-1960 MRN: [id number]   Date of Service  11/26/2016  HPI/Events of Note  K+ = 3.4 and Creatinine = 1.42.   eICU Interventions  Will replace K+.      Intervention Category Major Interventions: Electrolyte abnormality - evaluation and management  Redell Nazir Eugene 11/26/2016, 6:15 AM

## 2016-11-26 NOTE — Progress Notes (Addendum)
CCM UPDATE  Spoke with Toxicologist  Due to problems with pancreatitis and fat emobolization associated with lipid infusions  Plan to discontinue lipid infusion at this time  Alkalinization of serum has been challenging and complicated by hypokalemia Will Push 2 amps of Bicarb Get stat EKG post push and then another in 30 mins  To Monitor QTc and QRS duration for improvement If it does improve will increase rate of Bicarb ggt  Also continuing with aggressive potassium replacement    Signed Dr Seward Carol [facility] Critical [facility] Loc[facility]

## 2016-11-26 NOTE — Progress Notes (Signed)
Pt having short runs of VT that he spontaneously coverts to SR until this time when he sustained VT  And non pulisile on Aline and radial pulse.   Defib 120J and converte to NSR

## 2016-11-26 NOTE — Anesthesia Postprocedure Evaluation (Signed)
Anesthesia Post Note  Patient: [name] [name]  Procedure(s) Performed: CANNULATION FOR  VA ECMO (EXTRACORPOREAL MEMBRANE OXYGENATION) BEDSIDE (Bilateral Groin) INTRAOPERATIVE TRANSESOPHAGEAL ECHOCARDIOGRAM (N/A )     Patient location during evaluation: SICU Anesthesia Type: General Level of consciousness: sedated Pain management: pain level controlled Vital Signs Assessment: post-procedure vital signs reviewed and stable Respiratory status: patient remains intubated per anesthesia plan Cardiovascular status: stable Postop Assessment: no apparent nausea or vomiting Anesthetic complications: no    Last Vitals:  Vitals:   11/26/16 1030 11/26/16 1100  BP:  (!) 117/57  Pulse: 64 (!) 59  Resp: 12 13  Temp:    SpO2: 98% 98%    Last Pain:  Vitals:   11/26/16 0810  TempSrc: Oral  PainSc:                  Hibba Schram DAVID

## 2016-11-26 NOTE — Procedures (Signed)
Arterial Catheter Insertion Procedure Note [name] [name] [id number] 1960-04-19  Procedure: Insertion of Arterial Catheter  Indications: Blood pressure monitoring and Frequent blood sampling  Procedure Details Consent: Unable to obtain consent because of altered level of consciousness. Time Out: Verified patient identification, verified procedure, site/side was marked, verified correct patient position, special equipment/implants available, medications/allergies/relevent history reviewed, required imaging and test results available.  Performed  Maximum sterile technique was used including cap, gloves, gown, hand hygiene, mask and sheet. Skin prep: Chlorhexidine; local anesthetic administered 20 gauge catheter was inserted into right radial artery using the Seldinger technique.  Evaluation Blood flow good; BP tracing good. Complications: No apparent complications.  Arterial line was placed under sterile procedure with 1 attempt in RR artery per MD instructions due to needing it for OR procedure instead of current left catheter. Good blood flow noted, good tracing and BP noted. No complications. VS within normal limits.    [name] 11/26/2016

## 2016-11-27 ENCOUNTER — Encounter (HOSPITAL_COMMUNITY): Payer: Self-pay | Admitting: Cardiothoracic Surgery

## 2016-11-27 MED ORDER — FAMOTIDINE 20 MG/2ML IV SOLN
20.00 | INTRAVENOUS | Status: DC
Start: 2016-11-27 — End: 2016-11-27

## 2016-11-27 MED ORDER — DEXMEDETOMIDINE HCL IN NACL 400 MCG/100ML IV SOLN
0.20 | INTRAVENOUS | Status: DC
Start: ? — End: 2016-11-27

## 2016-11-27 MED ORDER — GENERIC EXTERNAL MEDICATION
0.00 | Status: DC
Start: ? — End: 2016-11-27

## 2016-11-27 MED ORDER — LACTATED RINGERS IV SOLN
INTRAVENOUS | Status: DC
Start: ? — End: 2016-11-27

## 2016-11-27 MED ORDER — GENERIC EXTERNAL MEDICATION
0.08 | Status: DC
Start: ? — End: 2016-11-27

## 2016-11-27 MED ORDER — GENERIC EXTERNAL MEDICATION
Status: DC
Start: ? — End: 2016-11-27

## 2016-11-27 MED ORDER — MIDAZOLAM HCL 2 MG/2ML IJ SOLN
2.00 | INTRAMUSCULAR | Status: DC
Start: ? — End: 2016-11-27

## 2016-11-27 MED ORDER — DEXTROSE 50 % IV SOLN
12.50 | INTRAVENOUS | Status: DC
Start: ? — End: 2016-11-27

## 2016-11-27 MED ORDER — FENTANYL CITRATE (PF) 2500 MCG/50ML IJ SOLN
50.00 | INTRAMUSCULAR | Status: DC
Start: ? — End: 2016-11-27

## 2016-11-27 MED ORDER — GENERIC EXTERNAL MEDICATION
150.00 | Status: DC
Start: ? — End: 2016-11-27

## 2016-11-27 MED ORDER — MIDAZOLAM HCL-SODIUM CHLORIDE 100-0.9 MG/100ML-% IV SOLN
2.00 | INTRAVENOUS | Status: DC
Start: ? — End: 2016-11-27

## 2016-11-27 MED ORDER — GLUCAGON HCL RDNA (DIAGNOSTIC) 1 MG IJ SOLR
1.00 | INTRAMUSCULAR | Status: DC
Start: ? — End: 2016-11-27

## 2016-11-27 MED ORDER — FLUCONAZOLE IN SODIUM CHLORIDE 200-0.9 MG/100ML-% IV SOLN
200.00 | INTRAVENOUS | Status: DC
Start: 2016-11-27 — End: 2016-11-27

## 2016-11-27 NOTE — Addendum Note (Signed)
Addendum  created 11/27/16 7408 by Lillia Abed, MD   Diagnosis association updated
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
## 2016-11-28 LAB — POCT I-STAT 7, (LYTES, BLD GAS, ICA,H+H)
Acid-Base Excess: 12 mmol/L — ABNORMAL HIGH (ref 0.0–2.0)
Acid-Base Excess: 9 mmol/L — ABNORMAL HIGH (ref 0.0–2.0)
Bicarbonate: 35.5 mmol/L — ABNORMAL HIGH (ref 20.0–28.0)
Bicarbonate: 32.4 mmol/L — ABNORMAL HIGH (ref 20.0–28.0)
Calcium, Ion: 0.91 mmol/L — ABNORMAL LOW (ref 1.15–1.40)
Calcium, Ion: 1.06 mmol/L — ABNORMAL LOW (ref 1.15–1.40)
HCT: 30 % — ABNORMAL LOW (ref 39.0–52.0)
HCT: 32 % — ABNORMAL LOW (ref 39.0–52.0)
Hemoglobin: 10.2 g/dL — ABNORMAL LOW (ref 13.0–17.0)
Hemoglobin: 10.9 g/dL — ABNORMAL LOW (ref 13.0–17.0)
O2 Saturation: 100 %
O2 Saturation: 99 %
Patient temperature: 37
Patient temperature: 37
Potassium: 3.4 mmol/L — ABNORMAL LOW (ref 3.5–5.1)
Potassium: 3.4 mmol/L — ABNORMAL LOW (ref 3.5–5.1)
Sodium: 131 mmol/L — ABNORMAL LOW (ref 135–145)
Sodium: 131 mmol/L — ABNORMAL LOW (ref 135–145)
TCO2: 37 mmol/L — ABNORMAL HIGH (ref 22–32)
TCO2: 34 mmol/L — ABNORMAL HIGH (ref 22–32)
pCO2 arterial: 40.9 mmHg (ref 32.0–48.0)
pCO2 arterial: 40.6 mmHg (ref 32.0–48.0)
pH, Arterial: 7.546 — ABNORMAL HIGH (ref 7.350–7.450)
pH, Arterial: 7.51 — ABNORMAL HIGH (ref 7.350–7.450)
pO2, Arterial: 282 mmHg — ABNORMAL HIGH (ref 83.0–108.0)
pO2, Arterial: 144 mmHg — ABNORMAL HIGH (ref 83.0–108.0)

## 2016-11-29 DIAGNOSIS — D696 Thrombocytopenia, unspecified: Secondary | ICD-10-CM | POA: Insufficient documentation

## 2016-11-30 MED ORDER — HYDROXYZINE PAMOATE 25 MG PO CAPS
25.00 | ORAL_CAPSULE | ORAL | Status: DC
Start: ? — End: 2016-11-30

## 2016-11-30 MED ORDER — OXYCODONE HCL 5 MG PO TABS
5.00 | ORAL_TABLET | ORAL | Status: DC
Start: ? — End: 2016-11-30

## 2016-11-30 MED ORDER — PREGABALIN 50 MG PO CAPS
50.00 | ORAL_CAPSULE | ORAL | Status: DC
Start: 2016-12-01 — End: 2016-11-30

## 2016-11-30 MED ORDER — MAGNESIUM OXIDE 400 MG PO TABS
800.00 | ORAL_TABLET | ORAL | Status: DC
Start: 2016-12-02 — End: 2016-11-30

## 2016-11-30 MED ORDER — POLYETHYLENE GLYCOL 3350 17 G PO PACK
17.00 | PACK | ORAL | Status: DC
Start: 2016-12-02 — End: 2016-11-30

## 2016-11-30 MED ORDER — HYDRALAZINE HCL 25 MG PO TABS
25.00 | ORAL_TABLET | ORAL | Status: DC
Start: 2016-12-02 — End: 2016-11-30

## 2016-11-30 MED ORDER — INSULIN GLARGINE 100 UNIT/ML ~~LOC~~ SOLN
15.00 | SUBCUTANEOUS | Status: DC
Start: 2016-12-02 — End: 2016-11-30

## 2016-11-30 MED ORDER — BISACODYL 10 MG RE SUPP
10.00 | RECTAL | Status: DC
Start: ? — End: 2016-11-30

## 2016-11-30 MED ORDER — SENNOSIDES-DOCUSATE SODIUM 8.6-50 MG PO TABS
2.00 | ORAL_TABLET | ORAL | Status: DC
Start: 2016-12-02 — End: 2016-11-30

## 2016-11-30 MED ORDER — FERROUS FUMARATE 324 (106 FE) MG PO TABS
324.00 | ORAL_TABLET | ORAL | Status: DC
Start: 2016-12-02 — End: 2016-11-30

## 2016-11-30 MED ORDER — INSULIN REGULAR HUMAN 100 UNIT/ML IJ SOLN
0.00 | INTRAMUSCULAR | Status: DC
Start: 2016-12-02 — End: 2016-11-30

## 2016-11-30 MED ORDER — LIDOCAINE HCL 1 % IJ SOLN
0.50 | INTRAMUSCULAR | Status: DC
Start: ? — End: 2016-11-30

## 2016-11-30 MED ORDER — ACETAMINOPHEN 325 MG PO TABS
650.00 | ORAL_TABLET | ORAL | Status: DC
Start: ? — End: 2016-11-30

## 2016-11-30 MED ORDER — LOSARTAN POTASSIUM 50 MG PO TABS
50.00 | ORAL_TABLET | ORAL | Status: DC
Start: 2016-12-02 — End: 2016-11-30

## 2016-11-30 MED ORDER — ZALEPLON 10 MG PO CAPS
10.00 | ORAL_CAPSULE | ORAL | Status: DC
Start: 2016-12-01 — End: 2016-11-30

## 2016-11-30 MED ORDER — VITAMIN C 500 MG PO TABS
500.00 | ORAL_TABLET | ORAL | Status: DC
Start: 2016-12-02 — End: 2016-11-30

## 2016-12-03 ENCOUNTER — Ambulatory Visit: Payer: No Typology Code available for payment source | Admitting: Podiatry

## 2016-12-03 ENCOUNTER — Ambulatory Visit (INDEPENDENT_AMBULATORY_CARE_PROVIDER_SITE_OTHER): Payer: Medicare Other | Admitting: Cardiology

## 2016-12-03 ENCOUNTER — Encounter: Payer: Self-pay | Admitting: Cardiology

## 2016-12-03 VITALS — BP 175/86 | HR 65 | Ht 74.0 in | Wt 229.4 lb

## 2016-12-03 DIAGNOSIS — I469 Cardiac arrest, cause unspecified: Secondary | ICD-10-CM | POA: Diagnosis not present

## 2016-12-03 DIAGNOSIS — I472 Ventricular tachycardia: Secondary | ICD-10-CM

## 2016-12-03 DIAGNOSIS — I4721 Adverse effect of unspecified drugs, medicaments and biological substances, initial encounter: Secondary | ICD-10-CM

## 2016-12-03 DIAGNOSIS — I10 Essential (primary) hypertension: Secondary | ICD-10-CM | POA: Diagnosis not present

## 2016-12-03 DIAGNOSIS — I4581 Long QT syndrome: Secondary | ICD-10-CM

## 2016-12-03 DIAGNOSIS — I517 Cardiomegaly: Secondary | ICD-10-CM | POA: Diagnosis not present

## 2016-12-03 DIAGNOSIS — I70245 Atherosclerosis of native arteries of left leg with ulceration of other part of foot: Secondary | ICD-10-CM

## 2016-12-03 DIAGNOSIS — T50905A Adverse effect of unspecified drugs, medicaments and biological substances, initial encounter: Secondary | ICD-10-CM | POA: Diagnosis not present

## 2016-12-03 DIAGNOSIS — R9431 Abnormal electrocardiogram [ECG] [EKG]: Secondary | ICD-10-CM

## 2016-12-03 MED ORDER — MELATONIN 3 MG PO TABS: 3.0000 mg | ORAL_TABLET | Freq: Every day | ORAL | 0 refills | Status: DC

## 2016-12-03 NOTE — Patient Instructions (Signed)
Medication Instructions: You may start Melatonin 3 mg tablet daily at bedtime as needed  If you need a refill on your cardiac medications before your next appointment, please call your pharmacy.    Procedures/Testing: Your physician has requested that you have an echocardiogram. Echocardiography is a painless test that uses sound waves to create images of your heart. It provides your doctor with information about the size and shape of your heart and how well your heart's chambers and valves are working. This procedure takes approximately one hour. There are no restrictions for this procedure. This will be done at [address].    Follow-Up: Your physician wants you to follow-up in: 3-4 weeks with Dr. Ellyn Hack. You will receive a reminder letter in the mail two months in advance. If you don't receive a letter, please call our office at [phone] to schedule this follow-up appointment.   Thank you for choosing Heartcare at [facility]!!
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
## 2016-12-05 ENCOUNTER — Encounter: Payer: Self-pay | Admitting: Cardiology

## 2016-12-05 NOTE — Assessment & Plan Note (Signed)
Most likely related to combination of high-dose loperamide and standing Seroquel.  QTC is still way prolonged at 550 ms. would not restart Seroquel until QTC is well below 480. Continue to avoid QT prolongation medications.  Will make a list of these medications to provide to the patient upon his next follow-up visit.

## 2016-12-05 NOTE — Assessment & Plan Note (Signed)
Likely still related to his recent hospitalization.  I do not know what his baseline QT interval was prior to his hospitalization.  Would continue to hold concerning agents including Seroquel until QTC is back into normal range.  I would hope that QTC will resolve by the time I see him back.  If not, may need to consider EP consult.

## 2016-12-05 NOTE — Assessment & Plan Note (Signed)
Usually normotensive.  He had been on losartan which has been restarted (he did not take today. We need to see what his EF looks like to see if additional requirements for blood pressure control are needed.  For now we will simply monitor a follow-up pressure when I see having follow-up I see him back.

## 2016-12-05 NOTE — Assessment & Plan Note (Addendum)
Related to drug-induced torsade de pointes.  Led to cardiac shock.  Fully recovered now but has not had, follow-up evaluation of his EF post arrest.  -Check 2D echo.  -He has post ECMO staples needs wound check/staple removal.  Will refer back to CT surgery.

## 2016-12-08 MED FILL — MAGNESIUM OXIDE 400 MG TAB: 400 | 15 days supply | Qty: 30 | Fill #0

## 2016-12-10 ENCOUNTER — Other Ambulatory Visit: Payer: Self-pay

## 2016-12-10 ENCOUNTER — Encounter (INDEPENDENT_AMBULATORY_CARE_PROVIDER_SITE_OTHER): Payer: Self-pay

## 2016-12-10 ENCOUNTER — Ambulatory Visit (INDEPENDENT_AMBULATORY_CARE_PROVIDER_SITE_OTHER): Payer: Medicare Other | Admitting: *Deleted

## 2016-12-10 ENCOUNTER — Ambulatory Visit (HOSPITAL_COMMUNITY): Payer: Medicare Other | Attending: Cardiology

## 2016-12-10 DIAGNOSIS — I10 Essential (primary) hypertension: Secondary | ICD-10-CM

## 2016-12-10 DIAGNOSIS — I517 Cardiomegaly: Secondary | ICD-10-CM

## 2016-12-10 DIAGNOSIS — Z8674 Personal history of sudden cardiac arrest: Secondary | ICD-10-CM | POA: Insufficient documentation

## 2016-12-10 DIAGNOSIS — I472 Ventricular tachycardia: Secondary | ICD-10-CM

## 2016-12-10 DIAGNOSIS — I119 Hypertensive heart disease without heart failure: Secondary | ICD-10-CM | POA: Diagnosis not present

## 2016-12-10 DIAGNOSIS — I469 Cardiac arrest, cause unspecified: Secondary | ICD-10-CM

## 2016-12-10 DIAGNOSIS — I4721 Torsades de pointes: Secondary | ICD-10-CM

## 2016-12-10 DIAGNOSIS — I4581 Long QT syndrome: Secondary | ICD-10-CM | POA: Diagnosis present

## 2016-12-10 DIAGNOSIS — R9431 Abnormal electrocardiogram [ECG] [EKG]: Secondary | ICD-10-CM | POA: Diagnosis not present

## 2016-12-10 DIAGNOSIS — T50905A Adverse effect of unspecified drugs, medicaments and biological substances, initial encounter: Secondary | ICD-10-CM

## 2016-12-10 DIAGNOSIS — G4730 Sleep apnea, unspecified: Secondary | ICD-10-CM | POA: Diagnosis not present

## 2016-12-10 DIAGNOSIS — Z4802 Encounter for removal of sutures: Secondary | ICD-10-CM

## 2016-12-10 NOTE — Progress Notes (Signed)
Patient here for an EKG per Dr Ellyn Hack for prolonged QT.  Dr Burt Knack reviewed and signed off.

## 2016-12-15 MED FILL — ?DULOXETINE HCL DR 30 MG CA: 30 MG | 30 days supply | Qty: 30 | Fill #1

## 2016-12-17 ENCOUNTER — Telehealth: Payer: Self-pay | Admitting: *Deleted

## 2016-12-17 MED FILL — $LANTUS 100 UNITS/ML VIAL: 100 | 25 days supply | Qty: 10 | Fill #1

## 2016-12-17 NOTE — Telephone Encounter (Signed)
Spoke to patient. Result given . Verbalized understanding  

## 2016-12-17 NOTE — Telephone Encounter (Signed)
-----   Message from Leonie Man, MD sent at 12/12/2016 11:34 AM EST ----- Doristine Devoid news.  The echocardiogram shows that the pump function is back to normal now. With no adverse findings.  Also with a follow-up EKG showed that the QT interval is getting better.  Hopefully when I see him back in follow-up and will still be stable and we can consider allowing him to restart Seroquel.  Glenetta Hew, MD

## 2016-12-17 NOTE — Telephone Encounter (Signed)
Follow up     Patient is returning [place] call

## 2016-12-17 NOTE — Telephone Encounter (Signed)
LEFT MESSAGE TO CALL BACK  F/U APPT 12/24/16

## 2016-12-24 ENCOUNTER — Ambulatory Visit (INDEPENDENT_AMBULATORY_CARE_PROVIDER_SITE_OTHER): Payer: Medicare Other | Admitting: Cardiology

## 2016-12-24 ENCOUNTER — Encounter: Payer: Self-pay | Admitting: Cardiology

## 2016-12-24 VITALS — BP 172/94 | HR 72 | Ht 74.0 in | Wt 230.4 lb

## 2016-12-24 DIAGNOSIS — I4581 Long QT syndrome: Secondary | ICD-10-CM | POA: Diagnosis not present

## 2016-12-24 DIAGNOSIS — R9431 Abnormal electrocardiogram [ECG] [EKG]: Secondary | ICD-10-CM | POA: Diagnosis not present

## 2016-12-24 DIAGNOSIS — T50905A Adverse effect of unspecified drugs, medicaments and biological substances, initial encounter: Secondary | ICD-10-CM

## 2016-12-24 DIAGNOSIS — I10 Essential (primary) hypertension: Secondary | ICD-10-CM | POA: Diagnosis not present

## 2016-12-24 DIAGNOSIS — E1142 Type 2 diabetes mellitus with diabetic polyneuropathy: Secondary | ICD-10-CM

## 2016-12-24 DIAGNOSIS — I70245 Atherosclerosis of native arteries of left leg with ulceration of other part of foot: Secondary | ICD-10-CM

## 2016-12-24 DIAGNOSIS — Z794 Long term (current) use of insulin: Secondary | ICD-10-CM | POA: Diagnosis not present

## 2016-12-24 DIAGNOSIS — I517 Cardiomegaly: Secondary | ICD-10-CM

## 2016-12-24 DIAGNOSIS — E785 Hyperlipidemia, unspecified: Secondary | ICD-10-CM

## 2016-12-24 DIAGNOSIS — I472 Ventricular tachycardia: Secondary | ICD-10-CM

## 2016-12-24 MED ORDER — LOSARTAN POTASSIUM 100 MG PO TABS: 100.0000 mg | ORAL_TABLET | Freq: Every day | ORAL | 3 refills | Status: DC

## 2016-12-24 MED ORDER — QUETIAPINE FUMARATE 300 MG PO TABS: 150.0000 mg | ORAL_TABLET | Freq: Every day | ORAL | 6 refills | Status: DC

## 2016-12-24 MED FILL — LOSARTAN POTASSIUM 100 MG T: 100 | 30 days supply | Qty: 30 | Fill #0

## 2016-12-24 NOTE — Patient Instructions (Signed)
MEDICATION INSTRUCTIONS   MAY START SEROQUEL 1/2 A TABLET AT BEDTIME.  --- START LOSARTAN 100 MG DAILY    NEXT WEEK PLEASE HAVE LABS DONE NEXT WEEK AND AS WELL AS BLOOD PRESSURE CHECK WITH A NURSE. CMP LIPID HGBA1C - NOTHING TO EAT OR DRINK THE MORNING OF THE TEST.   Your physician recommends that you schedule a follow-up appointment in [place] physician wants you to follow-up in April 2019 WITH DR [facility]. You will receive a reminder letter in the mail two months in advance. If you don't receive a letter, please call our office to schedule the follow-up appointment.    If you need a refill on your cardiac medications before your next appointment, please call your pharmacy.

## 2016-12-24 NOTE — Assessment & Plan Note (Signed)
As we are checking his lipids and chemistry levels, we will go ahead and check a hemoglobin A1c along with his other labs.

## 2016-12-24 NOTE — Assessment & Plan Note (Signed)
Blood pressure continues to be high.  I will increase his losartan dose to 100 mg daily.  I will asked that he has his blood pressure checked in a week when he gets his lipids checked.  I will then have him follow-up with our pharmacy team in 1 month to reassess his blood pressure and see if any further titration is required.  Need to ensure that there are no other drug interactions that could prolong QT.
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
## 2016-12-24 NOTE — Assessment & Plan Note (Signed)
No signs of heart failure.  Stable on follow-up echo.  No  significant diastolic dysfunction.  We will need to continue to treat blood pressure.  Increasing afterload reduction with losartan.

## 2016-12-24 NOTE — Assessment & Plan Note (Signed)
Repeat EKG shows corrected QTC, now normalized.  I suspect this was still residual from his hospitalization. He should be okay with Seroquel, but we need to follow closely.  I will asked that he has an EKG checked when he comes into [facility] ([facility] run by our clinical pharmacists)

## 2016-12-24 NOTE — Assessment & Plan Note (Addendum)
Severely elevated triglycerides last check and in November. He is on statin and fenofibrate.  Will recheck lipids this month to reassess levels.

## 2016-12-24 NOTE — Assessment & Plan Note (Signed)
Related to high-dose loperamide plus Seroquel.  QTC has now normalized back to 450.  Back to 450 ms.    At this point, I think is probably okay for him to go back on as needed Seroquel for sleep.  I would recommend that he be on half the dose he previously was on.  He will split the current pills in half and then ask for refill at the 150 mg dosing. I would like for you to have an EKG checked shortly after starting Seroquel.  This can be done when he sees our pharmacy team for blood pressure evaluation.

## 2016-12-26 MED FILL — ?ATORVASTATIN 40MG TABLET: 40 | 30 days supply | Qty: 30 | Fill #4

## 2016-12-26 MED FILL — ?FENOFIBRATE 145 MG TABS: 145 | 30 days supply | Qty: 30 | Fill #5

## 2016-12-26 MED FILL — METFORMIN HCL ER 500 MG TAB: 500 | 30 days supply | Qty: 60 | Fill #7

## 2016-12-31 ENCOUNTER — Ambulatory Visit: Payer: Medicare Other | Admitting: *Deleted

## 2016-12-31 ENCOUNTER — Encounter: Payer: Medicare Other | Attending: Physical Medicine & Rehabilitation | Admitting: Physical Medicine & Rehabilitation

## 2016-12-31 VITALS — BP 150/80 | HR 60

## 2016-12-31 DIAGNOSIS — F419 Anxiety disorder, unspecified: Secondary | ICD-10-CM | POA: Insufficient documentation

## 2016-12-31 DIAGNOSIS — E1140 Type 2 diabetes mellitus with diabetic neuropathy, unspecified: Secondary | ICD-10-CM | POA: Insufficient documentation

## 2016-12-31 DIAGNOSIS — R42 Dizziness and giddiness: Secondary | ICD-10-CM | POA: Insufficient documentation

## 2016-12-31 DIAGNOSIS — F319 Bipolar disorder, unspecified: Secondary | ICD-10-CM | POA: Insufficient documentation

## 2016-12-31 DIAGNOSIS — I10 Essential (primary) hypertension: Secondary | ICD-10-CM

## 2016-12-31 DIAGNOSIS — R531 Weakness: Secondary | ICD-10-CM | POA: Insufficient documentation

## 2016-12-31 DIAGNOSIS — G4730 Sleep apnea, unspecified: Secondary | ICD-10-CM | POA: Insufficient documentation

## 2016-12-31 NOTE — Progress Notes (Signed)
1.) Reason for visit: blood pressure check  2.) Name of MD requesting visit: dr harding  3.) H&P: elevated bp at office visit 12-24-16 and patient was started on losartan 100 mg once daily.  4.) ROS related to problem: no complaints, feels good.  5.) Assessment and plan per MD: patient has a follow up in the hypertension clinic 01-28-16. Will forward for dr harding's review and advise.

## 2016-12-31 NOTE — Patient Instructions (Signed)
No change in medications at this time.  

## 2017-01-09 MED FILL — $LANTUS 100 UNITS/ML VIAL: 100 | 25 days supply | Qty: 10 | Fill #2

## 2017-01-12 DIAGNOSIS — E1140 Type 2 diabetes mellitus with diabetic neuropathy, unspecified: Secondary | ICD-10-CM | POA: Insufficient documentation

## 2017-01-12 DIAGNOSIS — G4730 Sleep apnea, unspecified: Secondary | ICD-10-CM | POA: Insufficient documentation

## 2017-01-12 DIAGNOSIS — I10 Essential (primary) hypertension: Secondary | ICD-10-CM | POA: Insufficient documentation

## 2017-01-12 DIAGNOSIS — E119 Type 2 diabetes mellitus without complications: Secondary | ICD-10-CM | POA: Insufficient documentation

## 2017-01-15 DIAGNOSIS — M792 Neuralgia and neuritis, unspecified: Secondary | ICD-10-CM | POA: Diagnosis not present

## 2017-01-15 DIAGNOSIS — F319 Bipolar disorder, unspecified: Secondary | ICD-10-CM | POA: Diagnosis not present

## 2017-01-15 DIAGNOSIS — F1120 Opioid dependence, uncomplicated: Secondary | ICD-10-CM | POA: Diagnosis not present

## 2017-01-15 DIAGNOSIS — M545 Low back pain: Secondary | ICD-10-CM | POA: Diagnosis not present

## 2017-01-16 ENCOUNTER — Ambulatory Visit (INDEPENDENT_AMBULATORY_CARE_PROVIDER_SITE_OTHER): Payer: No Typology Code available for payment source | Admitting: Podiatry

## 2017-01-16 ENCOUNTER — Encounter: Payer: Self-pay | Admitting: Podiatry

## 2017-01-16 DIAGNOSIS — M79674 Pain in right toe(s): Secondary | ICD-10-CM | POA: Diagnosis not present

## 2017-01-16 DIAGNOSIS — B351 Tinea unguium: Secondary | ICD-10-CM

## 2017-01-16 DIAGNOSIS — M79675 Pain in left toe(s): Secondary | ICD-10-CM | POA: Diagnosis not present

## 2017-01-16 NOTE — Progress Notes (Signed)
Complaint:  Visit Type: Patient returns to my office for continued preventative foot care services. Complaint: Patient states" his nails have grown thick and long are painful walking and wearing his shoes.  Patient is diabetic with neuropathy.  Patient presents for preventative foot care services..Patient has heart complications since his last visit.  Podiatric Exam: Vascular: dorsalis pedis and posterior tibial pulses are palpable bilateral. Capillary return is immediate. Temperature gradient is WNL. Skin turgor WNL  Sensorium: Diminished Semmes Weinstein monofilament test. Normal tactile sensation bilaterally. Nail Exam: Pt has thick disfigured discolored nails with subungual debris noted bilateral entire nail hallux through fifth toenails Ulcer Exam: There is significant hemorrhagic callus noted on the medial aspect of the right hallux. No redness or swelling or drainage or bleeding noted Orthopedic Exam: Muscle tone and strength are WNL. No limitations in general ROM. No crepitus or effusions noted. Foot type and digits show no abnormalities. Bony prominences are unremarkable. Skin: No Porokeratosis. No infection or ulcers.    Diagnosis:  Onychomycosis, , Pain in right toe, pain in left toes,   Treatment & Plan Procedures and Treatment: Consent by patient was obtained for treatment procedures. The patient understood the discussion of treatment and procedures well. All questions were answered thoroughly reviewed. Debridement of mycotic and hypertrophic toenails, 1 through 5 bilateral and clearing of subungual debris. No ulceration, no infection noted.  ABN signed for 2019. Return Visit-Office Procedure: Patient instructed to return to the office for a follow up visit  10  weeks.for continued evaluation and treatment.    Eldin Bonsell DPM 

## 2017-01-22 DIAGNOSIS — M545 Low back pain: Secondary | ICD-10-CM | POA: Diagnosis not present

## 2017-01-22 DIAGNOSIS — M792 Neuralgia and neuritis, unspecified: Secondary | ICD-10-CM | POA: Diagnosis not present

## 2017-01-22 DIAGNOSIS — F1120 Opioid dependence, uncomplicated: Secondary | ICD-10-CM | POA: Diagnosis not present

## 2017-01-22 DIAGNOSIS — M5136 Other intervertebral disc degeneration, lumbar region: Secondary | ICD-10-CM | POA: Diagnosis not present

## 2017-01-23 MED FILL — ?FENOFIBRATE 145 MG TABS: 145 | 30 days supply | Qty: 30 | Fill #6

## 2017-01-23 MED FILL — LOSARTAN POTASSIUM 100 MG T: 100 | 30 days supply | Qty: 30 | Fill #1

## 2017-01-23 MED FILL — ?FUROSEMIDE 20 MG TABLET: 20 | 30 days supply | Qty: 30 | Fill #1

## 2017-01-23 MED FILL — METFORMIN HCL ER 500 MG TAB: 500 | 30 days supply | Qty: 60 | Fill #8

## 2017-01-23 MED FILL — ?ATORVASTATIN 40MG TABLET: 40 | 30 days supply | Qty: 30 | Fill #5

## 2017-01-27 ENCOUNTER — Ambulatory Visit (INDEPENDENT_AMBULATORY_CARE_PROVIDER_SITE_OTHER): Payer: PPO | Admitting: Pharmacist

## 2017-01-27 ENCOUNTER — Encounter: Payer: Self-pay | Admitting: Pharmacist

## 2017-01-27 ENCOUNTER — Encounter (INDEPENDENT_AMBULATORY_CARE_PROVIDER_SITE_OTHER): Payer: Self-pay

## 2017-01-27 ENCOUNTER — Telehealth (INDEPENDENT_AMBULATORY_CARE_PROVIDER_SITE_OTHER): Payer: PPO | Admitting: *Deleted

## 2017-01-27 VITALS — BP 150/82 | HR 74

## 2017-01-27 DIAGNOSIS — I472 Ventricular tachycardia: Secondary | ICD-10-CM

## 2017-01-27 DIAGNOSIS — R9431 Abnormal electrocardiogram [ECG] [EKG]: Secondary | ICD-10-CM | POA: Diagnosis not present

## 2017-01-27 DIAGNOSIS — Z79899 Other long term (current) drug therapy: Secondary | ICD-10-CM | POA: Diagnosis not present

## 2017-01-27 DIAGNOSIS — T50905A Adverse effect of unspecified drugs, medicaments and biological substances, initial encounter: Secondary | ICD-10-CM

## 2017-01-27 DIAGNOSIS — I4581 Long QT syndrome: Secondary | ICD-10-CM | POA: Diagnosis not present

## 2017-01-27 DIAGNOSIS — I10 Essential (primary) hypertension: Secondary | ICD-10-CM | POA: Diagnosis not present

## 2017-01-27 MED ORDER — AMLODIPINE BESYLATE 5 MG PO TABS: 5.0000 mg | ORAL_TABLET | Freq: Every day | ORAL | 1 refills | Status: DC

## 2017-01-27 MED FILL — AMLODIPINE BESYLATE 5 MG TA: 5 | 30 days supply | Qty: 30 | Fill #0

## 2017-01-27 NOTE — Assessment & Plan Note (Signed)
Blood pressure remains poorly controlled. Patient is NOT following a low sodium diet, and reports not taking furosemide for ~2 months after running out of medication. Low sodium diet and advantage of daily exercise were discussed with patient during OV. Will add amlodipine 5mg  daily to current therapy, continue losartan 100mg  and continue furosemide 2mg  daily. Plan to follow up with HTN clinic in 4 weeks.

## 2017-01-27 NOTE — Progress Notes (Signed)
Patient ID: RAWN QUIROA                 DOB: [birth year]                      MRN: [id number]     HPI: JAYQUON THEILER is a 57 y.o. male referred by Dr. Ellyn Hack to HTN clinic. PMH includes drug-induced torsade de pointes, cardiac arrest, diabetes, bipolar disorder, hypertension, and sleep apnea.  Losartan dose was increased from 50mg  daily to 100mg  daily by Dr Ellyn Hack on 12/24/16.  Patient presents today to HTN clinic for initial evaluation and medication titration as needed. He reports having HTN since age 88 and doesn't recall having problems with any medication for BP management in the past. He takes furosemide for low extremity edema due was out of medication for almost 2 months before getting a new refill 2-3 days ago.  Current HTN meds:  Furosemide 20mg  daily (out of medication for ~ 2 months; resumed 2-3 days ago) Losartan 100mg  daily  Previously tried:  Amlodipine 10mg  daily Lisinopril/HCTZ 20-12.5mg  daily  BP goal: 130/80  Family History:Diabetes in his mother; Heart disease in his father; Hyperlipidemia in his mother.   Social History: denies tobacco use, and denies alcohol use  Diet: most meals are fast food or deli;   Home BP readings: none available today (no home BP monitor)  Wt Readings from Last 3 Encounters:  12/24/16 230 lb 6.4 oz (104.5 kg)  12/03/16 229 lb 6.4 oz (104.1 kg)  11/25/16 242 lb 8.1 oz (110 kg)   BP Readings from Last 3 Encounters:  01/27/17 (!) 150/82  12/31/16 (!) 150/80  12/24/16 (!) 172/94   Pulse Readings from Last 3 Encounters:  01/27/17 74  12/31/16 60  12/24/16 72    Past Medical History:  Diagnosis Date  . Depressed bipolar disorder ([place])   . Diabetes mellitus without complication ([place])   . Diabetic neuropathy ([place])   . Herpes zoster 12/05/2008   Qualifier: Diagnosis of  By: Ronnald Ramp MD, Arvid Right.   . 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  Coupled with  high doses of loperamide used for pain control.  Unintentional overdose -intractable VT leading to cardiogenic shock - ECMO  . Hypertension   . Neuropathy in diabetes ([place])   . Sleep apnea     Current Outpatient Medications on File Prior to Visit  Medication Sig Dispense Refill  . atorvastatin (LIPITOR) 40 MG tablet Take 1 tablet (40 mg total) by mouth daily at 6 PM. 30 tablet 11  . buprenorphine-naloxone (SUBOXONE) 8-2 mg SUBL SL tablet Place 1 tablet under the tongue every 8 (eight) hours.    . fenofibrate (TRICOR) 145 MG tablet Take 1 tablet (145 mg total) by mouth daily. 30 tablet 11  . furosemide (LASIX) 20 MG tablet Take 1 tablet (20 mg total) by mouth daily. 30 tablet 3  . Insulin Glargine (LANTUS SOLOSTAR) 100 UNIT/ML Solostar Pen Inject 40 Units into the skin daily. (Patient taking differently: Inject 40 Units at bedtime into the skin. ) 30 mL 3  . lansoprazole (PREVACID) 15 MG capsule Take 15 mg daily as needed by mouth (for heartburn).     . losartan (COZAAR) 100 MG tablet Take 1 tablet (100 mg total) by mouth daily. 90 tablet 3  . metFORMIN (GLUCOPHAGE XR) 500 MG 24 hr tablet Take 2 tablets (1,000 mg total) by mouth daily after  supper. (Patient taking differently: Take 500 mg 2 (two) times daily by mouth. ) 60 tablet 11  . QUEtiapine (SEROQUEL) 300 MG tablet Take 0.5 tablets (150 mg total) by mouth at bedtime. 30 tablet 6  . nortriptyline (PAMELOR) 10 MG capsule Take 1-2 capsules (10-20 mg total) by mouth at bedtime. (Patient not taking: Reported on 01/27/2017) 60 capsule 2   No current facility-administered medications on file prior to visit.     Allergies  Allergen Reactions  . Heparin Other (See Comments)    HIT Ab negative on 02/20/15, but SRA POSITIVE   . Oxytetracycline Rash  . Sulfonamide Derivatives Rash    Blood pressure (!) 150/82, pulse 74, SpO2 98 %.  Hypertension Blood pressure remains poorly controlled. Patient is NOT following a low sodium diet, and reports  not taking furosemide for ~2 months after running out of medication. Low sodium diet and advantage of daily exercise were discussed with patient during OV. Will add amlodipine 5mg  daily to current therapy, continue losartan 100mg  and continue furosemide 2mg  daily. Plan to follow up with HTN clinic in 4 weeks.    Benedetto Ryder Rodriguez-Guzman PharmD, BCPS, [place] [address] [place] [address] 01/27/2017 8:39 PM

## 2017-01-27 NOTE — Patient Instructions (Signed)
Return for a follow up appointment in 4 weeks  Check your blood pressure at home daily (if able) and keep record of the readings.  Take your BP meds as follows: START taking amlodipine 5mg  daily CONTINUE all other medication as prescribed  Bring all of your meds, your BP cuff and your record of home blood pressures to your next appointment.  Exercise as you're able, try to walk approximately 30 minutes per day.  Keep salt intake to a minimum, especially watch canned and prepared boxed foods.  Eat more fresh fruits and vegetables and fewer canned items.  Avoid eating in fast food restaurants.    HOW TO TAKE YOUR BLOOD PRESSURE: . Rest 5 minutes before taking your blood pressure. .  Don't smoke or drink caffeinated beverages for at least 30 minutes before. . Take your blood pressure before (not after) you eat. . Sit comfortably with your back supported and both feet on the floor (don't cross your legs). . Elevate your arm to heart level on a table or a desk. . Use the proper sized cuff. It should fit smoothly and snugly around your bare upper arm. There should be enough room to slip a fingertip under the cuff. The bottom edge of the cuff should be 1 inch above the crease of the elbow. . Ideally, take 3 measurements at one sitting and record the average.

## 2017-01-27 NOTE — Telephone Encounter (Signed)
Patient is here today for schedule appointment with [facility] FOR BLOOD PRESSURE   also needed an EKG.  EKG OBTAINED REVIEWED BY DOD- DR CROITORU NO CHANGES   PATIENT AWARE WILL REVIEW WITH DR HARDING ANY CHANGES WILL CONTACT PATIENT VERBALIZED UNDERSTANDING  EKG IS BEING SCAN FOR REVIEW

## 2017-01-27 NOTE — Telephone Encounter (Signed)
That is great news. Concern was QT interval being back on his home medications.  As long as he does not take loperamide he is fine.  Glenetta Hew, MD

## 2017-01-29 DIAGNOSIS — M792 Neuralgia and neuritis, unspecified: Secondary | ICD-10-CM | POA: Diagnosis not present

## 2017-01-29 DIAGNOSIS — F319 Bipolar disorder, unspecified: Secondary | ICD-10-CM | POA: Diagnosis not present

## 2017-01-29 DIAGNOSIS — F1120 Opioid dependence, uncomplicated: Secondary | ICD-10-CM | POA: Diagnosis not present

## 2017-01-29 DIAGNOSIS — M545 Low back pain: Secondary | ICD-10-CM | POA: Diagnosis not present

## 2017-01-29 NOTE — Telephone Encounter (Signed)
SPOKE TO MOTHER , PATIENT NOT AT HOME . INFORMATION GIVEN  EKG IS GOOD . DO NOT TAKE LOPERAMIDE ( IMMODIUM) . SHE VOICE THAT SHE WILL GIVEN THE INFO TO PATIENT WHEN COMES HOME

## 2017-02-03 ENCOUNTER — Encounter: Payer: Self-pay | Admitting: Internal Medicine

## 2017-02-03 ENCOUNTER — Ambulatory Visit: Payer: PPO

## 2017-02-03 ENCOUNTER — Ambulatory Visit: Payer: PPO | Attending: Internal Medicine | Admitting: Internal Medicine

## 2017-02-03 VITALS — BP 128/82 | HR 69 | Temp 98.1°F | Resp 16 | Ht 74.0 in | Wt 254.4 lb

## 2017-02-03 DIAGNOSIS — G4730 Sleep apnea, unspecified: Secondary | ICD-10-CM | POA: Insufficient documentation

## 2017-02-03 DIAGNOSIS — M545 Low back pain: Secondary | ICD-10-CM

## 2017-02-03 DIAGNOSIS — M5440 Lumbago with sciatica, unspecified side: Secondary | ICD-10-CM | POA: Diagnosis not present

## 2017-02-03 DIAGNOSIS — M25511 Pain in right shoulder: Secondary | ICD-10-CM | POA: Insufficient documentation

## 2017-02-03 DIAGNOSIS — M47812 Spondylosis without myelopathy or radiculopathy, cervical region: Secondary | ICD-10-CM | POA: Insufficient documentation

## 2017-02-03 DIAGNOSIS — Z794 Long term (current) use of insulin: Secondary | ICD-10-CM

## 2017-02-03 DIAGNOSIS — I10 Essential (primary) hypertension: Secondary | ICD-10-CM | POA: Diagnosis not present

## 2017-02-03 DIAGNOSIS — M542 Cervicalgia: Secondary | ICD-10-CM | POA: Insufficient documentation

## 2017-02-03 DIAGNOSIS — E1142 Type 2 diabetes mellitus with diabetic polyneuropathy: Secondary | ICD-10-CM | POA: Diagnosis not present

## 2017-02-03 DIAGNOSIS — Z8674 Personal history of sudden cardiac arrest: Secondary | ICD-10-CM | POA: Diagnosis not present

## 2017-02-03 DIAGNOSIS — F3130 Bipolar disorder, current episode depressed, mild or moderate severity, unspecified: Secondary | ICD-10-CM | POA: Insufficient documentation

## 2017-02-03 DIAGNOSIS — E669 Obesity, unspecified: Secondary | ICD-10-CM | POA: Insufficient documentation

## 2017-02-03 DIAGNOSIS — Z8659 Personal history of other mental and behavioral disorders: Secondary | ICD-10-CM

## 2017-02-03 DIAGNOSIS — K219 Gastro-esophageal reflux disease without esophagitis: Secondary | ICD-10-CM | POA: Insufficient documentation

## 2017-02-03 DIAGNOSIS — Z6830 Body mass index (BMI) 30.0-30.9, adult: Secondary | ICD-10-CM | POA: Insufficient documentation

## 2017-02-03 DIAGNOSIS — Z79899 Other long term (current) drug therapy: Secondary | ICD-10-CM | POA: Diagnosis not present

## 2017-02-03 DIAGNOSIS — E785 Hyperlipidemia, unspecified: Secondary | ICD-10-CM | POA: Insufficient documentation

## 2017-02-03 DIAGNOSIS — E118 Type 2 diabetes mellitus with unspecified complications: Secondary | ICD-10-CM | POA: Diagnosis not present

## 2017-02-03 DIAGNOSIS — G8929 Other chronic pain: Secondary | ICD-10-CM | POA: Diagnosis not present

## 2017-02-03 LAB — POCT GLYCOSYLATED HEMOGLOBIN (HGB A1C): Hemoglobin A1C: 6.6

## 2017-02-03 LAB — GLUCOSE, POCT (MANUAL RESULT ENTRY): POC Glucose: 238 mg/dl — AB (ref 70–99)

## 2017-02-03 NOTE — Patient Instructions (Signed)
Discontinue eating sweet cereals.  Try snacking on other fruits like apple, small banana.   Try to get in some aerobic exercise 3-4 times a week for 30 minutes.
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
## 2017-02-04 LAB — COMPREHENSIVE METABOLIC PANEL
ALT: 21 IU/L (ref 0–44)
AST: 18 IU/L (ref 0–40)
Albumin/Globulin Ratio: 2 (ref 1.2–2.2)
Albumin: 4.7 g/dL (ref 3.5–5.5)
Alkaline Phosphatase: 50 IU/L (ref 39–117)
BUN/Creatinine Ratio: 20 (ref 9–20)
BUN: 23 mg/dL (ref 6–24)
Bilirubin Total: 0.3 mg/dL (ref 0.0–1.2)
CO2: 22 mmol/L (ref 20–29)
Calcium: 9.2 mg/dL (ref 8.7–10.2)
Chloride: 99 mmol/L (ref 96–106)
Creatinine, Ser: 1.14 mg/dL (ref 0.76–1.27)
GFR calc Af Amer: 83 mL/min/{1.73_m2} (ref >59)
GFR calc non Af Amer: 72 mL/min/{1.73_m2} (ref >59)
Globulin, Total: 2.3 g/dL (ref 1.5–4.5)
Glucose: 243 mg/dL — ABNORMAL HIGH (ref 65–99)
Potassium: 4 mmol/L (ref 3.5–5.2)
Sodium: 141 mmol/L (ref 134–144)
Total Protein: 7 g/dL (ref 6.0–8.5)

## 2017-02-04 LAB — CBC
Hematocrit: 37.2 % — ABNORMAL LOW (ref 37.5–51.0)
Hemoglobin: 12.3 g/dL — ABNORMAL LOW (ref 13.0–17.7)
MCH: 26.1 pg — ABNORMAL LOW (ref 26.6–33.0)
MCHC: 33.1 g/dL (ref 31.5–35.7)
MCV: 79 fL (ref 79–97)
Platelets: 196 10*3/uL (ref 150–379)
RBC: 4.72 x10E6/uL (ref 4.14–5.80)
RDW: 14.3 % (ref 12.3–15.4)
WBC: 5.1 10*3/uL (ref 3.4–10.8)

## 2017-02-04 LAB — MICROALBUMIN / CREATININE URINE RATIO
Creatinine, Urine: 83.7 mg/dL
Microalb/Creat Ratio: 11.7 mg/g creat (ref 0.0–30.0)
Microalbumin, Urine: 9.8 ug/mL

## 2017-02-04 LAB — LIPID PANEL
Chol/HDL Ratio: 3.3 ratio (ref 0.0–5.0)
Cholesterol, Total: 123 mg/dL (ref 100–199)
HDL: 37 mg/dL — ABNORMAL LOW (ref >39)
LDL Calculated: 54 mg/dL (ref 0–99)
Triglycerides: 161 mg/dL — ABNORMAL HIGH (ref 0–149)
VLDL Cholesterol Cal: 32 mg/dL (ref 5–40)

## 2017-02-04 MED FILL — $LANTUS 100 UNITS/ML VIAL: 100 | 25 days supply | Qty: 10 | Fill #3

## 2017-02-05 ENCOUNTER — Telehealth: Payer: Self-pay | Admitting: Internal Medicine

## 2017-02-05 DIAGNOSIS — F319 Bipolar disorder, unspecified: Secondary | ICD-10-CM | POA: Diagnosis not present

## 2017-02-05 DIAGNOSIS — M7918 Myalgia, other site: Secondary | ICD-10-CM | POA: Diagnosis not present

## 2017-02-05 DIAGNOSIS — M5136 Other intervertebral disc degeneration, lumbar region: Secondary | ICD-10-CM | POA: Diagnosis not present

## 2017-02-05 DIAGNOSIS — F1120 Opioid dependence, uncomplicated: Secondary | ICD-10-CM | POA: Diagnosis not present

## 2017-02-05 NOTE — Telephone Encounter (Signed)
Pt came in to request his lab results, nurse was in clinic. He was in a rush and requested a call back. Please follow up

## 2017-02-05 NOTE — Telephone Encounter (Signed)
Contacted pt and went over lab results. Pt doesn't have any questions or concerns. Pt states he was unable to get into mychart. Will have front desk send a new code to pt.

## 2017-02-12 DIAGNOSIS — F319 Bipolar disorder, unspecified: Secondary | ICD-10-CM | POA: Diagnosis not present

## 2017-02-12 DIAGNOSIS — M792 Neuralgia and neuritis, unspecified: Secondary | ICD-10-CM | POA: Diagnosis not present

## 2017-02-12 DIAGNOSIS — M545 Low back pain: Secondary | ICD-10-CM | POA: Diagnosis not present

## 2017-02-12 DIAGNOSIS — F1120 Opioid dependence, uncomplicated: Secondary | ICD-10-CM | POA: Diagnosis not present

## 2017-02-18 MED FILL — FUROSEMIDE 20 MG TABLET: 20 | 30 days supply | Qty: 30 | Fill #2

## 2017-02-18 MED FILL — LOSARTAN POTASSIUM 100 MG T: 100 | 30 days supply | Qty: 30 | Fill #2

## 2017-02-19 DIAGNOSIS — M545 Low back pain: Secondary | ICD-10-CM | POA: Diagnosis not present

## 2017-02-19 DIAGNOSIS — F1120 Opioid dependence, uncomplicated: Secondary | ICD-10-CM | POA: Diagnosis not present

## 2017-02-19 DIAGNOSIS — M792 Neuralgia and neuritis, unspecified: Secondary | ICD-10-CM | POA: Diagnosis not present

## 2017-02-23 ENCOUNTER — Ambulatory Visit (INDEPENDENT_AMBULATORY_CARE_PROVIDER_SITE_OTHER): Payer: PPO | Admitting: Pharmacist Clinician (PhC)/ Clinical Pharmacy Specialist

## 2017-02-23 DIAGNOSIS — I10 Essential (primary) hypertension: Secondary | ICD-10-CM

## 2017-02-23 NOTE — Assessment & Plan Note (Signed)
Patient with essential hypertension, now seemingly controlled with losartan and amlodipine.  Patient was encouraged to continue with medications and check BP routinely when at [facility].  He knows to call the office should those readings appear elevated.  He was also encouraged to further cut the amount of Coke he drinks per day.

## 2017-02-23 NOTE — Patient Instructions (Addendum)
  Your blood pressure today is 130/76  Take your BP meds as follows:  Continue with all your current medications  Bring all of your meds, your BP cuff and your record of home blood pressures to your next appointment.  Exercise as you're able, try to walk approximately 30 minutes per day.  Keep salt intake to a minimum, especially watch canned and prepared boxed foods.  Eat more fresh fruits and vegetables and fewer canned items.  Avoid eating in fast food restaurants.    HOW TO TAKE YOUR BLOOD PRESSURE: . Rest 5 minutes before taking your blood pressure. .  Don't smoke or drink caffeinated beverages for at least 30 minutes before. . Take your blood pressure before (not after) you eat. . Sit comfortably with your back supported and both feet on the floor (don't cross your legs). . Elevate your arm to heart level on a table or a desk. . Use the proper sized cuff. It should fit smoothly and snugly around your bare upper arm. There should be enough room to slip a fingertip under the cuff. The bottom edge of the cuff should be 1 inch above the crease of the elbow. . Ideally, take 3 measurements at one sitting and record the average.
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
## 2017-02-24 ENCOUNTER — Ambulatory Visit: Payer: Self-pay | Admitting: Internal Medicine

## 2017-02-26 DIAGNOSIS — F1120 Opioid dependence, uncomplicated: Secondary | ICD-10-CM | POA: Diagnosis not present

## 2017-02-26 DIAGNOSIS — F319 Bipolar disorder, unspecified: Secondary | ICD-10-CM | POA: Diagnosis not present

## 2017-02-26 DIAGNOSIS — M792 Neuralgia and neuritis, unspecified: Secondary | ICD-10-CM | POA: Diagnosis not present

## 2017-02-26 DIAGNOSIS — M545 Low back pain: Secondary | ICD-10-CM | POA: Diagnosis not present

## 2017-02-26 IMAGING — CR DG TOE GREAT 2+V*R*
4 series · 4 of 4 positions shown · non-contrast
Comparison: None.

CLINICAL DATA: Ulcer

EXAM:
RIGHT GREAT TOE

[toe ap]
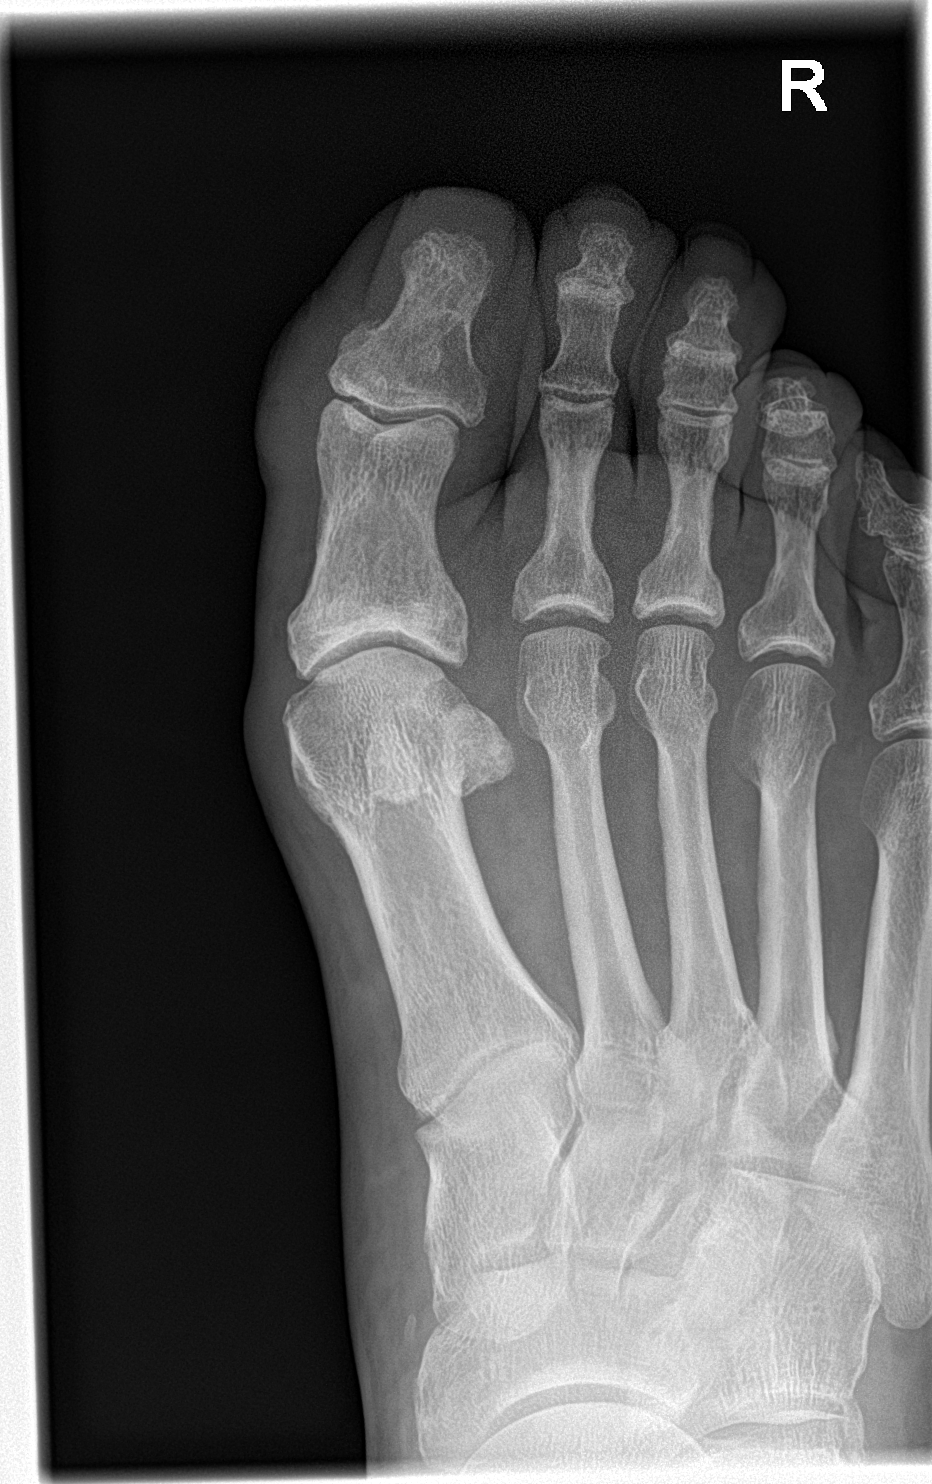

[toe obl]
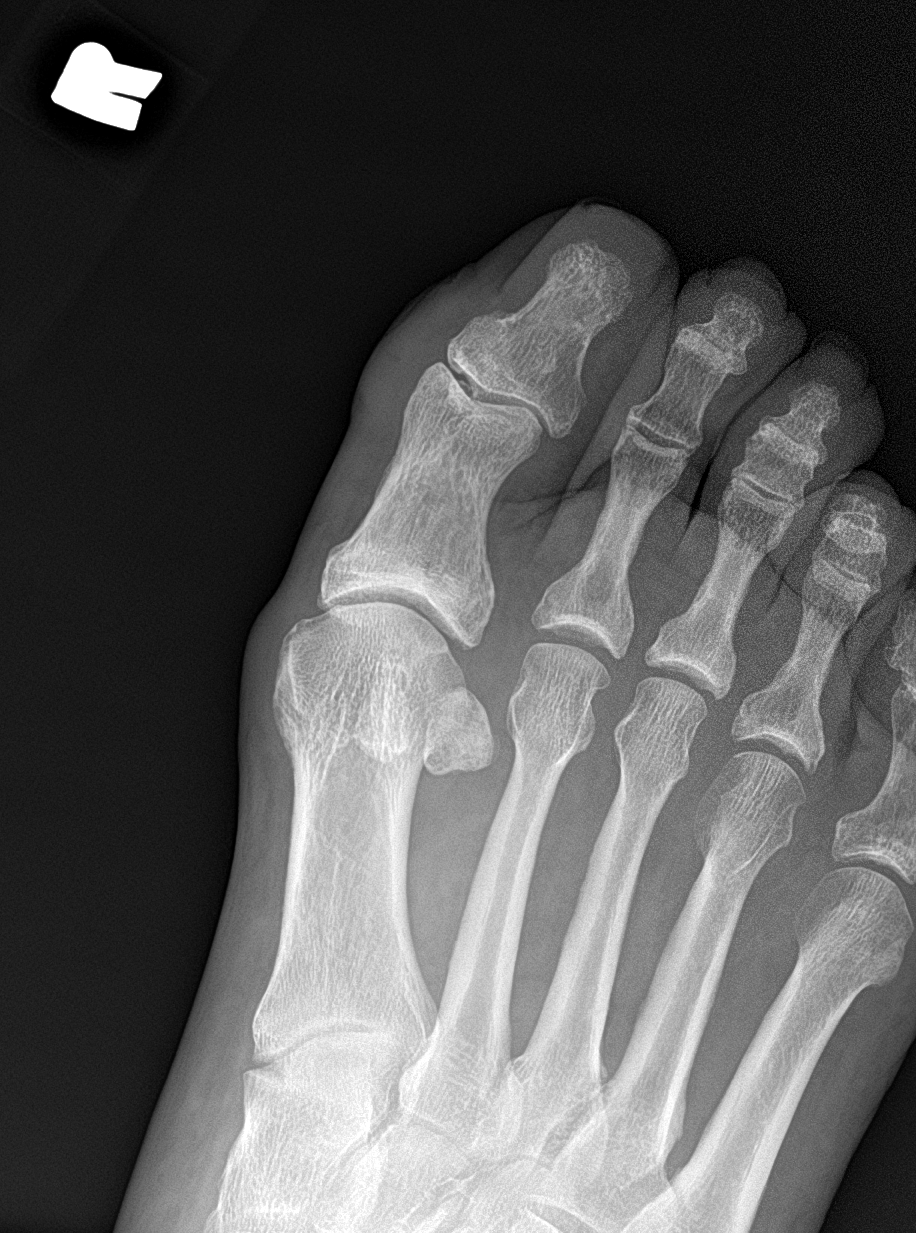

[toe lat (1 of 2)]
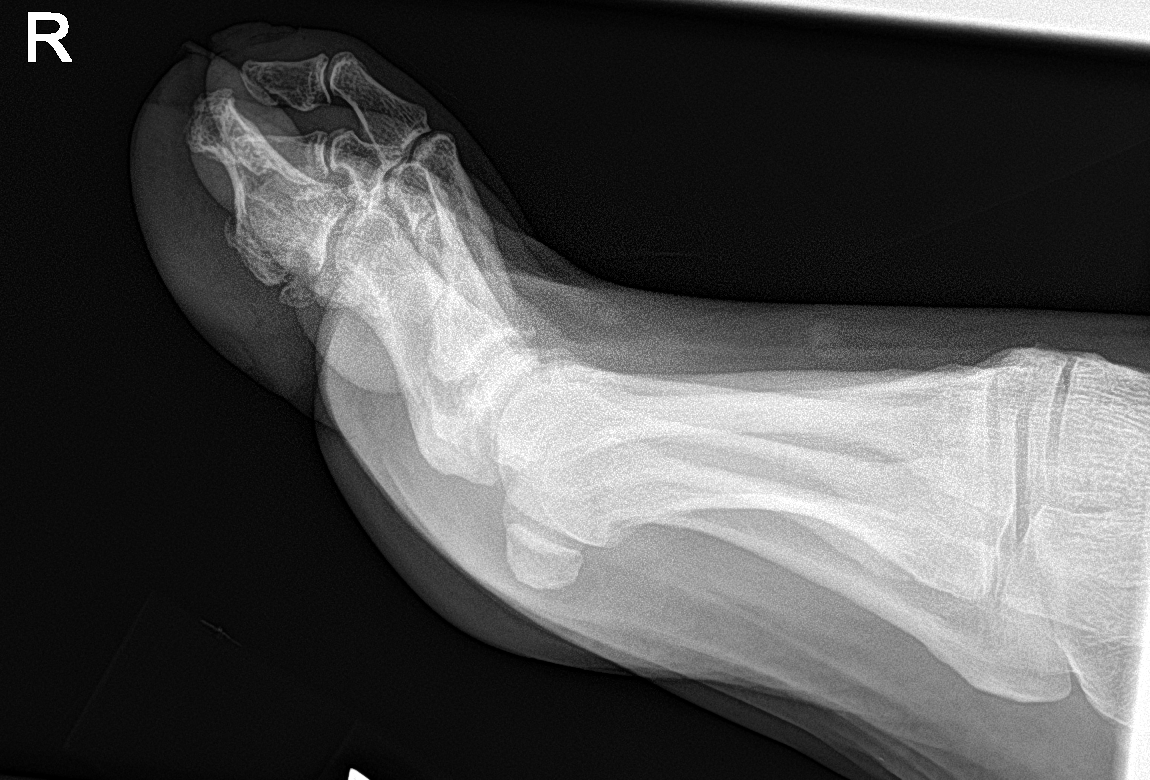

[toe lat (2 of 2)]
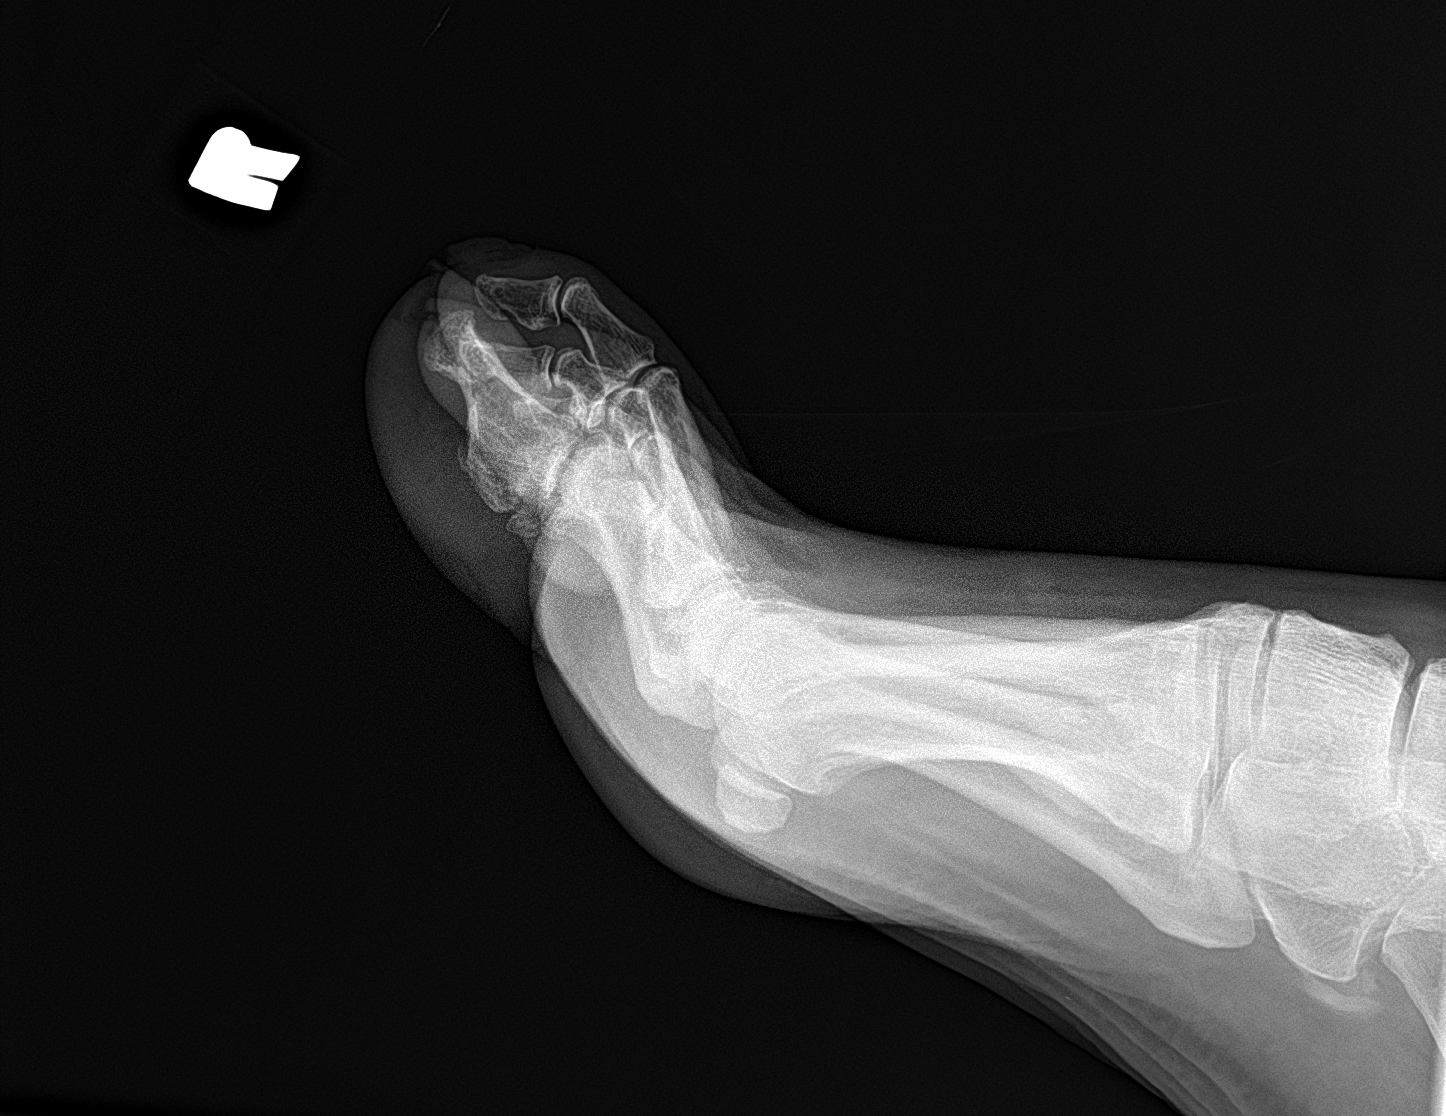

[4 of 4 positions shown; findings below may reference images not displayed]

FINDINGS: There is no obvious destructive bone lesion. Soft tissue swelling is
present medial to the IP joint of the great toe. Small avulsion
fracture at the palm are base of 1 of the middle phalanges in the
toes. Age is indeterminate. No evidence of great toe fracture.
IMPRESSION: There is a small avulsion fracture involving the palm are base of 1
of the middle phalanges in the toes other then the great toe. No
obvious bony destruction.

## 2017-03-02 MED FILL — AMLODIPINE BESYLATE 5 MG TA: 5 | 30 days supply | Qty: 30 | Fill #1

## 2017-03-02 MED FILL — $LANTUS 100 UNITS/ML VIAL: 100 | 25 days supply | Qty: 10 | Fill #4

## 2017-03-05 DIAGNOSIS — M545 Low back pain: Secondary | ICD-10-CM | POA: Diagnosis not present

## 2017-03-05 DIAGNOSIS — M792 Neuralgia and neuritis, unspecified: Secondary | ICD-10-CM | POA: Diagnosis not present

## 2017-03-05 DIAGNOSIS — F1120 Opioid dependence, uncomplicated: Secondary | ICD-10-CM | POA: Diagnosis not present

## 2017-03-09 ENCOUNTER — Encounter: Payer: Self-pay | Admitting: Gastroenterology

## 2017-03-11 DIAGNOSIS — M792 Neuralgia and neuritis, unspecified: Secondary | ICD-10-CM | POA: Diagnosis not present

## 2017-03-11 DIAGNOSIS — M545 Low back pain: Secondary | ICD-10-CM | POA: Diagnosis not present

## 2017-03-11 DIAGNOSIS — M7918 Myalgia, other site: Secondary | ICD-10-CM | POA: Diagnosis not present

## 2017-03-11 DIAGNOSIS — F1120 Opioid dependence, uncomplicated: Secondary | ICD-10-CM | POA: Diagnosis not present

## 2017-03-19 DIAGNOSIS — M792 Neuralgia and neuritis, unspecified: Secondary | ICD-10-CM | POA: Diagnosis not present

## 2017-03-19 DIAGNOSIS — F1120 Opioid dependence, uncomplicated: Secondary | ICD-10-CM | POA: Diagnosis not present

## 2017-03-19 DIAGNOSIS — M545 Low back pain: Secondary | ICD-10-CM | POA: Diagnosis not present

## 2017-03-19 DIAGNOSIS — F319 Bipolar disorder, unspecified: Secondary | ICD-10-CM | POA: Diagnosis not present

## 2017-03-20 ENCOUNTER — Other Ambulatory Visit: Payer: Self-pay

## 2017-03-20 DIAGNOSIS — E1142 Type 2 diabetes mellitus with diabetic polyneuropathy: Secondary | ICD-10-CM

## 2017-03-20 DIAGNOSIS — Z794 Long term (current) use of insulin: Secondary | ICD-10-CM

## 2017-03-20 MED FILL — LOSARTAN POTASSIUM 100 MG T: 100 | 30 days supply | Qty: 30 | Fill #3

## 2017-03-20 MED FILL — FUROSEMIDE 20 MG TABLET: 20 | 30 days supply | Qty: 30 | Fill #3

## 2017-03-23 ENCOUNTER — Other Ambulatory Visit: Payer: Self-pay | Admitting: Internal Medicine

## 2017-03-23 DIAGNOSIS — Z794 Long term (current) use of insulin: Secondary | ICD-10-CM

## 2017-03-23 DIAGNOSIS — E1142 Type 2 diabetes mellitus with diabetic polyneuropathy: Secondary | ICD-10-CM

## 2017-03-23 DIAGNOSIS — E785 Hyperlipidemia, unspecified: Secondary | ICD-10-CM

## 2017-03-23 MED ORDER — METFORMIN HCL ER 500 MG PO TB24: 500.0000 mg | ORAL_TABLET | Freq: Two times a day (BID) | ORAL | 6 refills | Status: DC

## 2017-03-23 MED ORDER — FENOFIBRATE 145 MG PO TABS: 145.0000 mg | ORAL_TABLET | Freq: Every day | ORAL | 11 refills | Status: DC

## 2017-03-23 MED ORDER — ATORVASTATIN CALCIUM 40 MG PO TABS: 40.0000 mg | ORAL_TABLET | Freq: Every day | ORAL | 11 refills | Status: DC

## 2017-03-23 MED FILL — METFORMIN HCL ER 500 MG TAB: 500 | 30 days supply | Qty: 60 | Fill #0

## 2017-03-23 MED FILL — ATORVASTATIN 40 MG TABLET: 40 | 30 days supply | Qty: 30 | Fill #0

## 2017-03-23 MED FILL — FENOFIBRATE 145 MG TABLET: 145 | 30 days supply | Qty: 30 | Fill #0

## 2017-03-26 DIAGNOSIS — F1120 Opioid dependence, uncomplicated: Secondary | ICD-10-CM | POA: Diagnosis not present

## 2017-03-26 DIAGNOSIS — M545 Low back pain: Secondary | ICD-10-CM | POA: Diagnosis not present

## 2017-03-26 DIAGNOSIS — M792 Neuralgia and neuritis, unspecified: Secondary | ICD-10-CM | POA: Diagnosis not present

## 2017-03-27 ENCOUNTER — Ambulatory Visit: Payer: No Typology Code available for payment source | Admitting: Podiatry

## 2017-04-07 ENCOUNTER — Ambulatory Visit: Payer: PPO | Admitting: Podiatry

## 2017-04-07 ENCOUNTER — Encounter: Payer: Self-pay | Admitting: Podiatry

## 2017-04-07 ENCOUNTER — Other Ambulatory Visit: Payer: Self-pay | Admitting: Cardiology

## 2017-04-07 DIAGNOSIS — M79675 Pain in left toe(s): Secondary | ICD-10-CM

## 2017-04-07 DIAGNOSIS — B351 Tinea unguium: Secondary | ICD-10-CM | POA: Diagnosis not present

## 2017-04-07 DIAGNOSIS — M79674 Pain in right toe(s): Secondary | ICD-10-CM | POA: Diagnosis not present

## 2017-04-07 DIAGNOSIS — E1142 Type 2 diabetes mellitus with diabetic polyneuropathy: Secondary | ICD-10-CM

## 2017-04-07 MED FILL — AMLODIPINE BESYLATE 5 MG TA: 5 | 30 days supply | Qty: 30 | Fill #0

## 2017-04-07 MED FILL — LANTUS 100 UNITS/ML VIAL: 100 | 25 days supply | Qty: 10 | Fill #5

## 2017-04-07 NOTE — Progress Notes (Signed)
Complaint:  Visit Type: Patient returns to my office for continued preventative foot care services. Complaint: Patient states" his nails have grown thick and long are painful walking and wearing his shoes.  Patient is diabetic with neuropathy.  Patient presents for preventative foot care services..Patient has heart complications since his last visit.  Podiatric Exam: Vascular: dorsalis pedis and posterior tibial pulses are palpable bilateral. Capillary return is immediate. Temperature gradient is WNL. Skin turgor WNL  Sensorium: Diminished Semmes Weinstein monofilament test. Normal tactile sensation bilaterally. Nail Exam: Pt has thick disfigured discolored nails with subungual debris noted bilateral entire nail hallux through fifth toenails Ulcer Exam: There is significant hemorrhagic callus noted on the medial aspect of the right hallux. No redness or swelling or drainage or bleeding noted Orthopedic Exam: Muscle tone and strength are WNL. No limitations in general ROM. No crepitus or effusions noted. Foot type and digits show no abnormalities. Bony prominences are unremarkable. Skin: No Porokeratosis. No infection or ulcers.    Diagnosis:  Onychomycosis, , Pain in right toe, pain in left toes,   Treatment & Plan Procedures and Treatment: Consent by patient was obtained for treatment procedures. The patient understood the discussion of treatment and procedures well. All questions were answered thoroughly reviewed. Debridement of mycotic and hypertrophic toenails, 1 through 5 bilateral and clearing of subungual debris. No ulceration, no infection noted.  ABN signed for 2019. Return Visit-Office Procedure: Patient instructed to return to the office for a follow up visit  10  weeks.for continued evaluation and treatment.    Ruthie Berch DPM 

## 2017-04-07 NOTE — Telephone Encounter (Signed)
REFILL 

## 2017-04-09 DIAGNOSIS — M545 Low back pain: Secondary | ICD-10-CM | POA: Diagnosis not present

## 2017-04-09 DIAGNOSIS — M792 Neuralgia and neuritis, unspecified: Secondary | ICD-10-CM | POA: Diagnosis not present

## 2017-04-09 DIAGNOSIS — F1120 Opioid dependence, uncomplicated: Secondary | ICD-10-CM | POA: Diagnosis not present

## 2017-04-22 MED FILL — FENOFIBRATE 145 MG TABLET: 145 | 30 days supply | Qty: 30 | Fill #1

## 2017-04-22 MED FILL — METFORMIN HCL ER 500 MG TAB: 500 | 30 days supply | Qty: 60 | Fill #1

## 2017-04-22 MED FILL — ATORVASTATIN CALCIUM 40 MG: 40 | 30 days supply | Qty: 30 | Fill #1

## 2017-04-23 DIAGNOSIS — F1120 Opioid dependence, uncomplicated: Secondary | ICD-10-CM | POA: Diagnosis not present

## 2017-04-23 DIAGNOSIS — M792 Neuralgia and neuritis, unspecified: Secondary | ICD-10-CM | POA: Diagnosis not present

## 2017-04-23 DIAGNOSIS — M545 Low back pain: Secondary | ICD-10-CM | POA: Diagnosis not present

## 2017-04-24 ENCOUNTER — Telehealth: Payer: Self-pay

## 2017-04-24 NOTE — Telephone Encounter (Signed)
Patient No showed for PV. Left a message for patient to call and reschedule before 5:00 Pm today. If patient does not reschedule colonoscopy will be cancelled per [place] guidelines. A no show letter will be mailed at the end of the day.   Riki Sheer, LPN ( PV )

## 2017-04-28 ENCOUNTER — Other Ambulatory Visit: Payer: Self-pay

## 2017-04-28 ENCOUNTER — Ambulatory Visit (AMBULATORY_SURGERY_CENTER): Payer: Self-pay | Admitting: *Deleted

## 2017-04-28 VITALS — Ht 74.0 in | Wt 254.2 lb

## 2017-04-28 DIAGNOSIS — Z1211 Encounter for screening for malignant neoplasm of colon: Secondary | ICD-10-CM

## 2017-04-28 NOTE — Progress Notes (Addendum)
No egg or soy allergy known to patient  No issues with past sedation with any surgeries  or procedures, no intubation problems  No diet pills per patient No home 02 use per patient  No blood thinners per patient  Pt has issues with constipation  Uses enema  No A fib or A flutter  EMMI video sent to pt's e mail pt. Declined   Sample of Plenvu given to patient.    Lot  [id number]  Exp  06/2018

## 2017-05-01 DIAGNOSIS — F3131 Bipolar disorder, current episode depressed, mild: Secondary | ICD-10-CM | POA: Diagnosis not present

## 2017-05-04 ENCOUNTER — Encounter: Payer: Self-pay | Admitting: Gastroenterology

## 2017-05-07 DIAGNOSIS — M545 Low back pain: Secondary | ICD-10-CM | POA: Diagnosis not present

## 2017-05-07 DIAGNOSIS — F1120 Opioid dependence, uncomplicated: Secondary | ICD-10-CM | POA: Diagnosis not present

## 2017-05-07 DIAGNOSIS — M792 Neuralgia and neuritis, unspecified: Secondary | ICD-10-CM | POA: Diagnosis not present

## 2017-05-14 MED FILL — LOSARTAN POTASSIUM 100 MG T: 100 | 30 days supply | Qty: 30 | Fill #4

## 2017-05-14 MED FILL — AMLODIPINE BESYLATE 5 MG TA: 5 | 30 days supply | Qty: 30 | Fill #1

## 2017-05-15 ENCOUNTER — Encounter: Payer: PPO | Admitting: Gastroenterology

## 2017-05-18 ENCOUNTER — Other Ambulatory Visit: Payer: Self-pay

## 2017-05-18 DIAGNOSIS — I10 Essential (primary) hypertension: Secondary | ICD-10-CM

## 2017-05-18 DIAGNOSIS — Z794 Long term (current) use of insulin: Secondary | ICD-10-CM

## 2017-05-18 DIAGNOSIS — E1142 Type 2 diabetes mellitus with diabetic polyneuropathy: Secondary | ICD-10-CM

## 2017-05-18 MED ORDER — FUROSEMIDE 20 MG PO TABS: 20.0000 mg | ORAL_TABLET | Freq: Every day | ORAL | 0 refills | Status: DC

## 2017-05-18 MED FILL — FUROSEMIDE 20 MG TABLET: 20 | 30 days supply | Qty: 30 | Fill #0

## 2017-05-20 DIAGNOSIS — F3131 Bipolar disorder, current episode depressed, mild: Secondary | ICD-10-CM | POA: Diagnosis not present

## 2017-05-21 DIAGNOSIS — M792 Neuralgia and neuritis, unspecified: Secondary | ICD-10-CM | POA: Diagnosis not present

## 2017-05-21 DIAGNOSIS — F1120 Opioid dependence, uncomplicated: Secondary | ICD-10-CM | POA: Diagnosis not present

## 2017-05-21 DIAGNOSIS — M545 Low back pain: Secondary | ICD-10-CM | POA: Diagnosis not present

## 2017-05-25 MED FILL — METFORMIN HCL ER 500 MG TAB: 500 | 30 days supply | Qty: 60 | Fill #2

## 2017-05-26 ENCOUNTER — Other Ambulatory Visit: Payer: Self-pay | Admitting: Internal Medicine

## 2017-05-26 MED ORDER — INSULIN GLARGINE 100 UNIT/ML ~~LOC~~ SOLN: 40.0000 [IU] | Freq: Every day | SUBCUTANEOUS | 11 refills | Status: DC

## 2017-05-27 DIAGNOSIS — D225 Melanocytic nevi of trunk: Secondary | ICD-10-CM | POA: Diagnosis not present

## 2017-05-27 DIAGNOSIS — D2272 Melanocytic nevi of left lower limb, including hip: Secondary | ICD-10-CM | POA: Diagnosis not present

## 2017-05-27 DIAGNOSIS — C44719 Basal cell carcinoma of skin of left lower limb, including hip: Secondary | ICD-10-CM | POA: Diagnosis not present

## 2017-05-27 DIAGNOSIS — L570 Actinic keratosis: Secondary | ICD-10-CM | POA: Diagnosis not present

## 2017-05-27 DIAGNOSIS — C44319 Basal cell carcinoma of skin of other parts of face: Secondary | ICD-10-CM | POA: Diagnosis not present

## 2017-05-27 DIAGNOSIS — C44519 Basal cell carcinoma of skin of other part of trunk: Secondary | ICD-10-CM | POA: Diagnosis not present

## 2017-05-27 DIAGNOSIS — D485 Neoplasm of uncertain behavior of skin: Secondary | ICD-10-CM | POA: Diagnosis not present

## 2017-05-27 DIAGNOSIS — D2271 Melanocytic nevi of right lower limb, including hip: Secondary | ICD-10-CM | POA: Diagnosis not present

## 2017-05-27 MED FILL — LANTUS 100 UNITS/ML VIAL: 100 | 25 days supply | Qty: 10 | Fill #0

## 2017-06-02 DIAGNOSIS — F3131 Bipolar disorder, current episode depressed, mild: Secondary | ICD-10-CM | POA: Diagnosis not present

## 2017-06-04 DIAGNOSIS — F1120 Opioid dependence, uncomplicated: Secondary | ICD-10-CM | POA: Diagnosis not present

## 2017-06-04 DIAGNOSIS — M545 Low back pain: Secondary | ICD-10-CM | POA: Diagnosis not present

## 2017-06-04 DIAGNOSIS — F319 Bipolar disorder, unspecified: Secondary | ICD-10-CM | POA: Diagnosis not present

## 2017-06-12 DIAGNOSIS — F3131 Bipolar disorder, current episode depressed, mild: Secondary | ICD-10-CM | POA: Diagnosis not present

## 2017-06-17 ENCOUNTER — Encounter: Payer: Self-pay | Admitting: Podiatry

## 2017-06-17 ENCOUNTER — Ambulatory Visit: Payer: PPO | Admitting: Podiatry

## 2017-06-17 DIAGNOSIS — M79674 Pain in right toe(s): Secondary | ICD-10-CM

## 2017-06-17 DIAGNOSIS — E1142 Type 2 diabetes mellitus with diabetic polyneuropathy: Secondary | ICD-10-CM

## 2017-06-17 DIAGNOSIS — B351 Tinea unguium: Secondary | ICD-10-CM | POA: Diagnosis not present

## 2017-06-17 DIAGNOSIS — M79675 Pain in left toe(s): Secondary | ICD-10-CM

## 2017-06-17 NOTE — Progress Notes (Signed)
Complaint:  Visit Type: Patient returns to my office for continued preventative foot care services. Complaint: Patient states" his nails have grown thick and long are painful walking and wearing his shoes.  Patient is diabetic with neuropathy.  Patient presents for preventative foot care services..Patient has heart complications since his last visit.  Podiatric Exam: Vascular: dorsalis pedis and posterior tibial pulses are palpable bilateral. Capillary return is immediate. Temperature gradient is WNL. Skin turgor WNL  Sensorium: Diminished Semmes Weinstein monofilament test. Normal tactile sensation bilaterally. Nail Exam: Pt has thick disfigured discolored nails with subungual debris noted bilateral entire nail hallux through fifth toenails Ulcer Exam: There is significant hemorrhagic callus noted on the medial aspect of the right hallux. No redness or swelling or drainage or bleeding noted Orthopedic Exam: Muscle tone and strength are WNL. No limitations in general ROM. No crepitus or effusions noted. Foot type and digits show no abnormalities. Bony prominences are unremarkable. Skin: No Porokeratosis. No infection or ulcers.    Diagnosis:  Onychomycosis, , Pain in right toe, pain in left toes,   Treatment & Plan Procedures and Treatment: Consent by patient was obtained for treatment procedures. The patient understood the discussion of treatment and procedures well. All questions were answered thoroughly reviewed. Debridement of mycotic and hypertrophic toenails, 1 through 5 bilateral and clearing of subungual debris. No ulceration, no infection noted.  ABN signed for 2019. Return Visit-Office Procedure: Patient instructed to return to the office for a follow up visit  10  weeks.for continued evaluation and treatment.    Jariana Shumard DPM 

## 2017-06-18 ENCOUNTER — Telehealth: Payer: Self-pay | Admitting: Internal Medicine

## 2017-06-18 DIAGNOSIS — F1120 Opioid dependence, uncomplicated: Secondary | ICD-10-CM | POA: Diagnosis not present

## 2017-06-18 DIAGNOSIS — M792 Neuralgia and neuritis, unspecified: Secondary | ICD-10-CM | POA: Diagnosis not present

## 2017-06-18 DIAGNOSIS — M545 Low back pain: Secondary | ICD-10-CM | POA: Diagnosis not present

## 2017-06-18 NOTE — Telephone Encounter (Signed)
Called patient to schedule a f/u appt. LVM to return call and schedule first available appt.[name] [name]

## 2017-06-19 ENCOUNTER — Ambulatory Visit: Payer: Self-pay | Admitting: Internal Medicine

## 2017-06-22 ENCOUNTER — Other Ambulatory Visit: Payer: Self-pay | Admitting: Internal Medicine

## 2017-06-22 DIAGNOSIS — I10 Essential (primary) hypertension: Secondary | ICD-10-CM

## 2017-06-22 MED FILL — AMLODIPINE BESYLATE 5 MG TA: 5 | 30 days supply | Qty: 30 | Fill #2

## 2017-06-22 MED FILL — FENOFIBRATE 145 MG TABLET: 145 | 30 days supply | Qty: 30 | Fill #2

## 2017-06-22 MED FILL — LANTUS 100 UNITS/ML VIAL: 100 | 25 days supply | Qty: 10 | Fill #1

## 2017-06-22 MED FILL — ATORVASTATIN CALCIUM 40 MG: 40 | 30 days supply | Qty: 30 | Fill #2

## 2017-06-22 MED FILL — METFORMIN HCL ER 500 MG TAB: 500 | 30 days supply | Qty: 60 | Fill #3

## 2017-06-22 MED FILL — FUROSEMIDE 20 MG TABLET: 20 | 30 days supply | Qty: 30 | Fill #0

## 2017-06-22 MED FILL — LOSARTAN POTASSIUM 100 MG T: 100 | 30 days supply | Qty: 30 | Fill #5

## 2017-06-23 IMAGING — CR DG CHEST 1V PORT
1 series · 1 of 1 positions shown · non-contrast
Comparison: 02/21/2015

CLINICAL DATA: Unsuccessful attempt at of right-sided central line
placement. Evaluate for pneumothorax.

EXAM:
PORTABLE CHEST 1 VIEW

[AP]
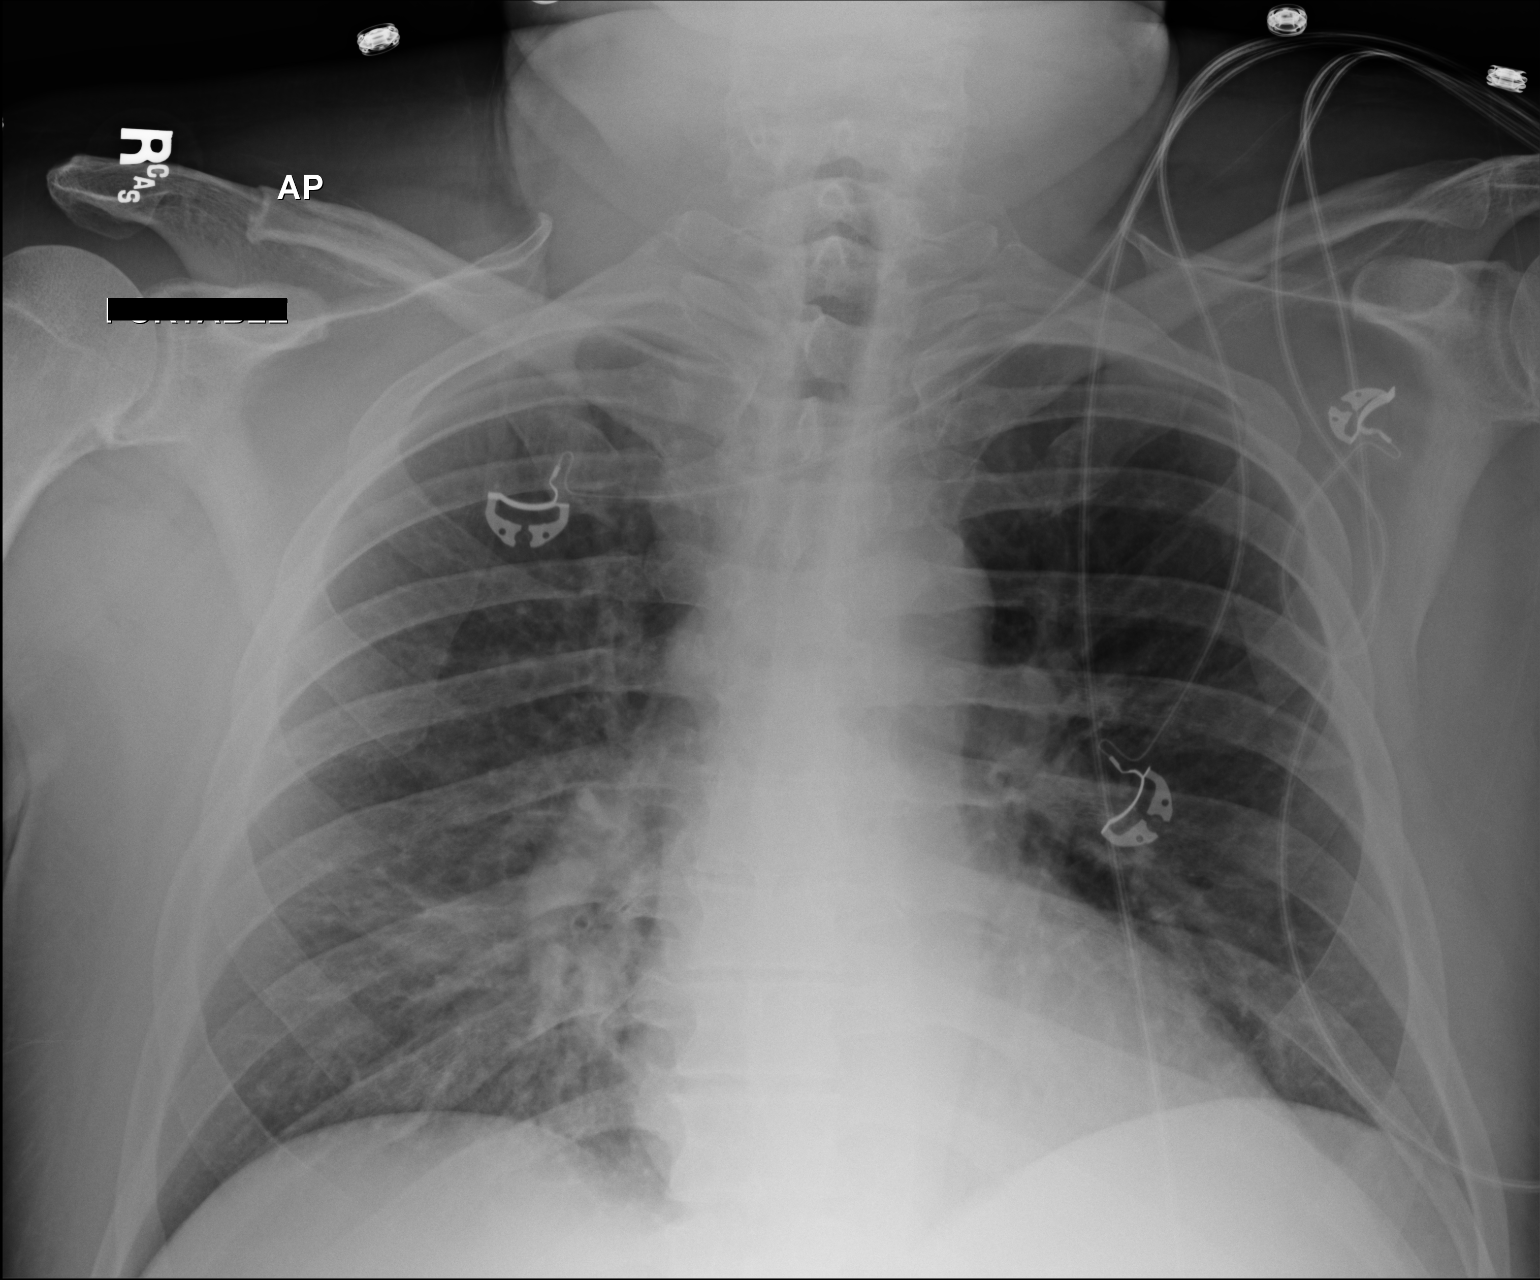

[1 of 1 positions shown; findings below may reference images not displayed]

FINDINGS: There is no evidence of a pneumothorax.

Opacity at the right lung base is unchanged, likely atelectasis. No
new lung opacities.

Endotracheal Tube and nasal/orogastric tube have been removed since
the prior exam.
IMPRESSION: 1. No evidence of a pneumothorax following central line attempt.
2. Persistent right lung base opacity consistent with atelectasis.
Lungs otherwise clear.
3. Status post extubation and removal of the oral/nasogastric tube.

## 2017-06-29 DIAGNOSIS — F1120 Opioid dependence, uncomplicated: Secondary | ICD-10-CM | POA: Diagnosis not present

## 2017-07-01 DIAGNOSIS — Z85828 Personal history of other malignant neoplasm of skin: Secondary | ICD-10-CM | POA: Diagnosis not present

## 2017-07-01 DIAGNOSIS — C44519 Basal cell carcinoma of skin of other part of trunk: Secondary | ICD-10-CM | POA: Diagnosis not present

## 2017-07-01 DIAGNOSIS — C44719 Basal cell carcinoma of skin of left lower limb, including hip: Secondary | ICD-10-CM | POA: Diagnosis not present

## 2017-07-02 DIAGNOSIS — F319 Bipolar disorder, unspecified: Secondary | ICD-10-CM | POA: Diagnosis not present

## 2017-07-02 DIAGNOSIS — M792 Neuralgia and neuritis, unspecified: Secondary | ICD-10-CM | POA: Diagnosis not present

## 2017-07-02 DIAGNOSIS — F1120 Opioid dependence, uncomplicated: Secondary | ICD-10-CM | POA: Diagnosis not present

## 2017-07-02 DIAGNOSIS — M545 Low back pain: Secondary | ICD-10-CM | POA: Diagnosis not present

## 2017-07-06 DIAGNOSIS — F3131 Bipolar disorder, current episode depressed, mild: Secondary | ICD-10-CM | POA: Diagnosis not present

## 2017-07-15 ENCOUNTER — Encounter: Payer: PPO | Admitting: Gastroenterology

## 2017-07-22 DIAGNOSIS — F1120 Opioid dependence, uncomplicated: Secondary | ICD-10-CM | POA: Diagnosis not present

## 2017-07-22 DIAGNOSIS — M545 Low back pain: Secondary | ICD-10-CM | POA: Diagnosis not present

## 2017-07-24 ENCOUNTER — Encounter: Payer: Self-pay | Admitting: Internal Medicine

## 2017-07-24 ENCOUNTER — Ambulatory Visit: Payer: PPO | Attending: Internal Medicine | Admitting: Internal Medicine

## 2017-07-24 ENCOUNTER — Other Ambulatory Visit: Payer: Self-pay | Admitting: Internal Medicine

## 2017-07-24 VITALS — BP 117/75 | HR 74 | Temp 98.5°F | Resp 16 | Wt 252.0 lb

## 2017-07-24 DIAGNOSIS — I10 Essential (primary) hypertension: Secondary | ICD-10-CM

## 2017-07-24 DIAGNOSIS — M1990 Unspecified osteoarthritis, unspecified site: Secondary | ICD-10-CM | POA: Diagnosis not present

## 2017-07-24 DIAGNOSIS — Z888 Allergy status to other drugs, medicaments and biological substances status: Secondary | ICD-10-CM | POA: Insufficient documentation

## 2017-07-24 DIAGNOSIS — F3130 Bipolar disorder, current episode depressed, mild or moderate severity, unspecified: Secondary | ICD-10-CM | POA: Insufficient documentation

## 2017-07-24 DIAGNOSIS — Z86010 Personal history of colonic polyps: Secondary | ICD-10-CM | POA: Diagnosis not present

## 2017-07-24 DIAGNOSIS — Z9889 Other specified postprocedural states: Secondary | ICD-10-CM | POA: Insufficient documentation

## 2017-07-24 DIAGNOSIS — Z79899 Other long term (current) drug therapy: Secondary | ICD-10-CM | POA: Diagnosis not present

## 2017-07-24 DIAGNOSIS — M545 Low back pain: Secondary | ICD-10-CM | POA: Diagnosis not present

## 2017-07-24 DIAGNOSIS — G4730 Sleep apnea, unspecified: Secondary | ICD-10-CM | POA: Diagnosis not present

## 2017-07-24 DIAGNOSIS — E785 Hyperlipidemia, unspecified: Secondary | ICD-10-CM | POA: Diagnosis not present

## 2017-07-24 DIAGNOSIS — E669 Obesity, unspecified: Secondary | ICD-10-CM | POA: Insufficient documentation

## 2017-07-24 DIAGNOSIS — E1142 Type 2 diabetes mellitus with diabetic polyneuropathy: Secondary | ICD-10-CM | POA: Insufficient documentation

## 2017-07-24 DIAGNOSIS — K219 Gastro-esophageal reflux disease without esophagitis: Secondary | ICD-10-CM | POA: Diagnosis not present

## 2017-07-24 DIAGNOSIS — Z6830 Body mass index (BMI) 30.0-30.9, adult: Secondary | ICD-10-CM | POA: Insufficient documentation

## 2017-07-24 DIAGNOSIS — Z882 Allergy status to sulfonamides status: Secondary | ICD-10-CM | POA: Diagnosis not present

## 2017-07-24 DIAGNOSIS — Z833 Family history of diabetes mellitus: Secondary | ICD-10-CM | POA: Diagnosis not present

## 2017-07-24 DIAGNOSIS — M542 Cervicalgia: Secondary | ICD-10-CM | POA: Insufficient documentation

## 2017-07-24 DIAGNOSIS — Z794 Long term (current) use of insulin: Secondary | ICD-10-CM | POA: Diagnosis not present

## 2017-07-24 LAB — GLUCOSE, POCT (MANUAL RESULT ENTRY): POC Glucose: 219 mg/dl — AB (ref 70–99)

## 2017-07-24 MED ORDER — METFORMIN HCL ER 500 MG PO TB24: 1000.0000 mg | ORAL_TABLET | Freq: Two times a day (BID) | ORAL | 6 refills | Status: DC

## 2017-07-24 MED FILL — LOSARTAN POTASSIUM 100 MG T: 100 | 30 days supply | Qty: 30 | Fill #6

## 2017-07-24 MED FILL — LANTUS 100 UNITS/ML VIAL: 100 | 25 days supply | Qty: 10 | Fill #2

## 2017-07-24 MED FILL — FENOFIBRATE 145 MG TABLET: 145 | 30 days supply | Qty: 30 | Fill #3

## 2017-07-24 MED FILL — ATORVASTATIN CALCIUM 40 MG: 40 | 30 days supply | Qty: 30 | Fill #3

## 2017-07-24 MED FILL — FUROSEMIDE 20 MG TABLET: 20 | 30 days supply | Qty: 30 | Fill #0

## 2017-07-24 MED FILL — AMLODIPINE BESYLATE 5 MG TA: 5 | 30 days supply | Qty: 30 | Fill #3

## 2017-07-24 MED FILL — METFORMIN HCL ER 500 MG TAB: 500 | 30 days supply | Qty: 60 | Fill #4
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
## 2017-07-24 NOTE — Patient Instructions (Signed)
Increase Metformin to 1000 mg Twice a day.  Try to check your blood sugars at least once a day.    Follow a Healthy Eating Plan - You can do it! Limit sugary drinks.  Avoid sodas, sweet tea, sport or energy drinks, or fruit drinks.  Drink water, lo-fat milk, or diet drinks. Limit snack foods.   Cut back on candy, cake, cookies, chips, ice cream.  These are a special treat, only in small amounts. Eat plenty of vegetables.  Especially dark green, red, and orange vegetables. Aim for at least 3 servings a day. More is better! Include fruit in your daily diet.  Whole fruit is much healthier than fruit juice! Limit "white" bread, "white" pasta, "white" rice.   Choose "100% whole grain" products, brown or wild rice. Avoid fatty meats. Try "Meatless Monday" and choose eggs or beans one day a week.  When eating meat, choose lean meats like chicken, [place], and fish.  Grill, broil, or bake meats instead of frying, and eat poultry without the skin. Eat less salt.  Avoid frozen pizzas, frozen dinners and salty foods.  Use seasonings other than salt in cooking.  This can help blood pressure and keep you from swelling Beer, wine and liquor have calories.  If you can safely drink alcohol, limit to 1 drink per day for women, 2 drinks for men

## 2017-07-25 LAB — BASIC METABOLIC PANEL
BUN/Creatinine Ratio: 17 (ref 9–20)
BUN: 21 mg/dL (ref 6–24)
CO2: 23 mmol/L (ref 20–29)
Calcium: 9.5 mg/dL (ref 8.7–10.2)
Chloride: 100 mmol/L (ref 96–106)
Creatinine, Ser: 1.21 mg/dL (ref 0.76–1.27)
GFR calc Af Amer: 77 mL/min/{1.73_m2} (ref >59)
GFR calc non Af Amer: 67 mL/min/{1.73_m2} (ref >59)
Glucose: 181 mg/dL — ABNORMAL HIGH (ref 65–99)
Potassium: 3.8 mmol/L (ref 3.5–5.2)
Sodium: 139 mmol/L (ref 134–144)

## 2017-07-25 LAB — HEMOGLOBIN A1C
Est. average glucose Bld gHb Est-mCnc: 226 mg/dL
Hgb A1c MFr Bld: 9.5 % — ABNORMAL HIGH (ref 4.8–5.6)

## 2017-07-28 DIAGNOSIS — Z85828 Personal history of other malignant neoplasm of skin: Secondary | ICD-10-CM | POA: Diagnosis not present

## 2017-07-28 DIAGNOSIS — C44329 Squamous cell carcinoma of skin of other parts of face: Secondary | ICD-10-CM | POA: Diagnosis not present

## 2017-07-28 DIAGNOSIS — C44319 Basal cell carcinoma of skin of other parts of face: Secondary | ICD-10-CM | POA: Diagnosis not present

## 2017-08-02 DIAGNOSIS — F1120 Opioid dependence, uncomplicated: Secondary | ICD-10-CM | POA: Diagnosis not present

## 2017-08-12 DIAGNOSIS — M792 Neuralgia and neuritis, unspecified: Secondary | ICD-10-CM | POA: Diagnosis not present

## 2017-08-12 DIAGNOSIS — M545 Low back pain: Secondary | ICD-10-CM | POA: Diagnosis not present

## 2017-08-12 DIAGNOSIS — F1120 Opioid dependence, uncomplicated: Secondary | ICD-10-CM | POA: Diagnosis not present

## 2017-08-24 MED FILL — AMLODIPINE BESYLATE 5 MG TA: 5 | 30 days supply | Qty: 30 | Fill #4

## 2017-08-24 MED FILL — FUROSEMIDE 20 MG TABLET: 20 | 30 days supply | Qty: 30 | Fill #1

## 2017-08-24 MED FILL — METFORMIN HCL ER 500 MG TAB: 500 | 30 days supply | Qty: 60 | Fill #5

## 2017-08-24 MED FILL — LANTUS 100 UNITS/ML VIAL: 100 | 25 days supply | Qty: 10 | Fill #3

## 2017-08-24 MED FILL — ATORVASTATIN CALCIUM 40 MG: 40 | 30 days supply | Qty: 30 | Fill #4

## 2017-08-24 MED FILL — LOSARTAN POTASSIUM 100 MG T: 100 | 30 days supply | Qty: 30 | Fill #7

## 2017-08-24 MED FILL — FENOFIBRATE 145 MG TABLET: 145 | 30 days supply | Qty: 30 | Fill #4

## 2017-08-26 ENCOUNTER — Encounter: Payer: Self-pay | Admitting: Podiatry

## 2017-08-26 ENCOUNTER — Ambulatory Visit: Payer: PPO | Admitting: Podiatry

## 2017-08-26 DIAGNOSIS — B351 Tinea unguium: Secondary | ICD-10-CM

## 2017-08-26 DIAGNOSIS — E1142 Type 2 diabetes mellitus with diabetic polyneuropathy: Secondary | ICD-10-CM | POA: Diagnosis not present

## 2017-08-26 DIAGNOSIS — M79675 Pain in left toe(s): Secondary | ICD-10-CM | POA: Diagnosis not present

## 2017-08-26 DIAGNOSIS — M79674 Pain in right toe(s): Secondary | ICD-10-CM

## 2017-08-26 NOTE — Progress Notes (Signed)
Complaint:  Visit Type: Patient returns to my office for continued preventative foot care services. Complaint: Patient states" his nails have grown thick and long are painful walking and wearing his shoes.  Patient is diabetic with neuropathy.  Patient presents for preventative foot care services..Patient has heart complications since his last visit.  Podiatric Exam: Vascular: dorsalis pedis and posterior tibial pulses are palpable bilateral. Capillary return is immediate. Temperature gradient is WNL. Skin turgor WNL  Sensorium: Diminished Semmes Weinstein monofilament test. Normal tactile sensation bilaterally. Nail Exam: Pt has thick disfigured discolored nails with subungual debris noted bilateral entire nail hallux through fifth toenails Ulcer Exam: There is significant hemorrhagic callus noted on the medial aspect of the right hallux. No redness or swelling or drainage or bleeding noted Orthopedic Exam: Muscle tone and strength are WNL. No limitations in general ROM. No crepitus or effusions noted. Foot type and digits show no abnormalities. Bony prominences are unremarkable. Skin: No Porokeratosis. No infection or ulcers.    Diagnosis:  Onychomycosis, , Pain in right toe, pain in left toes,   Treatment & Plan Procedures and Treatment: Consent by patient was obtained for treatment procedures. The patient understood the discussion of treatment and procedures well. All questions were answered thoroughly reviewed. Debridement of mycotic and hypertrophic toenails, 1 through 5 bilateral and clearing of subungual debris. No ulceration, no infection noted.  ABN signed for 2019. Return Visit-Office Procedure: Patient instructed to return to the office for a follow up visit  10  weeks.for continued evaluation and treatment.    Gardiner Barefoot DPM

## 2017-08-27 DIAGNOSIS — F1120 Opioid dependence, uncomplicated: Secondary | ICD-10-CM | POA: Diagnosis not present

## 2017-08-27 DIAGNOSIS — M545 Low back pain: Secondary | ICD-10-CM | POA: Diagnosis not present

## 2017-08-27 DIAGNOSIS — M792 Neuralgia and neuritis, unspecified: Secondary | ICD-10-CM | POA: Diagnosis not present

## 2017-09-08 ENCOUNTER — Ambulatory Visit: Payer: PPO | Admitting: Podiatry

## 2017-09-08 ENCOUNTER — Encounter: Payer: Self-pay | Admitting: Podiatry

## 2017-09-08 DIAGNOSIS — E1142 Type 2 diabetes mellitus with diabetic polyneuropathy: Secondary | ICD-10-CM | POA: Diagnosis not present

## 2017-09-08 DIAGNOSIS — L97512 Non-pressure chronic ulcer of other part of right foot with fat layer exposed: Secondary | ICD-10-CM | POA: Diagnosis not present

## 2017-09-08 NOTE — Progress Notes (Signed)
This patient presents the office with chief complaint of an open wound under his right big toe.  He says that he's been doing significant walking while on vacation.  He said that 2 weeks ago he noticed an open wound on the bottom of his right big toe.  He says it started as a small crack in his toe and developed into a transverse fissure under big toe right.  He presents the office today for an evaluation and treatment of this open wound on the bottom of his right big toe.  Vascular  Dorsalis pedis and posterior tibial pulses are palpable  B/L.  Capillary return  WNL.  Temperature gradient is  WNL.  Skin turgor  WNL  Sensorium  Senn Weinstein monofilament wire diminished . Diminished  tactile sensation.  Nail Exam  Thick disfigured discolored nails  With subungual debris  hallux through fifth toe bilateral.    Orthopedic  Exam  Muscle tone and muscle strength  WNL.  No limitations of motion feet  B/L.  No crepitus or joint effusion noted.  Foot type is unremarkable and digits show no abnormalities.  Bony prominences are unremarkable.  Skin .  Normal skin texture and turgor. Transverse fissure noted proximal to callus right hallux.  No redness or swelling or infection noted.  Diabetic ulcer right hallux  ROV.  Debride necrotic tissue.  Neosporin/DSD. Discussed this condition with this patient.  Reassured him there was no evidence of infection and he needed to moisturize the fissure.  Reassured him this should heal uneventfully.  Call the office if problems occur.   Gardiner Barefoot DPM

## 2017-09-17 DIAGNOSIS — M7918 Myalgia, other site: Secondary | ICD-10-CM | POA: Diagnosis not present

## 2017-09-17 DIAGNOSIS — F1120 Opioid dependence, uncomplicated: Secondary | ICD-10-CM | POA: Diagnosis not present

## 2017-09-17 DIAGNOSIS — M792 Neuralgia and neuritis, unspecified: Secondary | ICD-10-CM | POA: Diagnosis not present

## 2017-09-29 ENCOUNTER — Other Ambulatory Visit: Payer: Self-pay | Admitting: Cardiology

## 2017-09-29 MED FILL — LOSARTAN POTASSIUM 100 MG T: 100 | 30 days supply | Qty: 30 | Fill #8

## 2017-09-29 MED FILL — FENOFIBRATE 145 MG TABLET: 145 | 30 days supply | Qty: 30 | Fill #5

## 2017-09-29 MED FILL — LANTUS 100 UNITS/ML VIAL: 100 | 25 days supply | Qty: 10 | Fill #4

## 2017-09-29 MED FILL — FUROSEMIDE 20 MG TABLET: 20 | 30 days supply | Qty: 30 | Fill #2

## 2017-09-29 MED FILL — METFORMIN HCL ER 500 MG TAB: 500 | 30 days supply | Qty: 60 | Fill #6

## 2017-09-29 MED FILL — ATORVASTATIN CALCIUM 40 MG: 40 | 30 days supply | Qty: 30 | Fill #5

## 2017-09-29 MED FILL — AMLODIPINE BESYLATE 5 MG TA: 5 | 90 days supply | Qty: 90 | Fill #0

## 2017-10-02 DIAGNOSIS — F1120 Opioid dependence, uncomplicated: Secondary | ICD-10-CM | POA: Diagnosis not present

## 2017-10-07 DIAGNOSIS — F1120 Opioid dependence, uncomplicated: Secondary | ICD-10-CM | POA: Diagnosis not present

## 2017-10-07 DIAGNOSIS — M545 Low back pain: Secondary | ICD-10-CM | POA: Diagnosis not present

## 2017-10-28 DIAGNOSIS — F1120 Opioid dependence, uncomplicated: Secondary | ICD-10-CM | POA: Diagnosis not present

## 2017-11-03 MED FILL — METFORMIN HCL ER 500 MG TAB: 500 | 30 days supply | Qty: 120 | Fill #0

## 2017-11-03 MED FILL — LANTUS 100 UNITS/ML VIAL: 100 | 25 days supply | Qty: 10 | Fill #5

## 2017-11-03 MED FILL — LOSARTAN POTASSIUM 100 MG T: 100 | 30 days supply | Qty: 30 | Fill #9

## 2017-11-03 MED FILL — ATORVASTATIN 40 MG TABLET: 40 | 30 days supply | Qty: 30 | Fill #6

## 2017-11-03 MED FILL — FENOFIBRATE 145 MG TABLET: 145 | 30 days supply | Qty: 30 | Fill #6

## 2017-11-04 ENCOUNTER — Ambulatory Visit: Payer: PPO | Admitting: Podiatry

## 2017-11-04 ENCOUNTER — Encounter: Payer: Self-pay | Admitting: Podiatry

## 2017-11-04 DIAGNOSIS — B351 Tinea unguium: Secondary | ICD-10-CM | POA: Diagnosis not present

## 2017-11-04 DIAGNOSIS — M79675 Pain in left toe(s): Secondary | ICD-10-CM | POA: Diagnosis not present

## 2017-11-04 DIAGNOSIS — M79674 Pain in right toe(s): Secondary | ICD-10-CM

## 2017-11-04 DIAGNOSIS — E0842 Diabetes mellitus due to underlying condition with diabetic polyneuropathy: Secondary | ICD-10-CM

## 2017-11-04 DIAGNOSIS — L97512 Non-pressure chronic ulcer of other part of right foot with fat layer exposed: Secondary | ICD-10-CM

## 2017-11-04 NOTE — Progress Notes (Signed)
This patient presents the office with chief complaint of an open wound under his right big toe.  He says that he opened the skin lesion climbing a ladder.  His previous fissure had healed uneventfully.  He said this skin lesion  Has  developed into a transverse fissure under big toe right.  He presents the office today for an evaluation and treatment of this open wound on the bottom of his right big toe.  Vascular  Dorsalis pedis and posterior tibial pulses are palpable  B/L.  Capillary return  WNL.  Temperature gradient is  WNL.  Skin turgor  WNL  Sensorium  Senn Weinstein monofilament wire diminished . Diminished  tactile sensation.  Nail Exam  Thick disfigured discolored nails  With subungual debris  hallux through fifth toe bilateral.    Orthopedic  Exam  Muscle tone and muscle strength  WNL.  No limitations of motion feet  B/L.  No crepitus or joint effusion noted.  Foot type is unremarkable and digits show no abnormalities.  Mild  HAV  B/L.  Skin .  Normal skin texture and turgor. Transverse fissure noted proximal to callus right hallux.  No redness or swelling or infection noted.  Diabetic ulcer right hallux  ROV.  Debride necrotic tissue.  Neosporin/DSD. Discussed this condition with this patient.  Reassured him there was no evidence of infection and he needed to moisturize the fissure.  Reassured him this should heal uneventfully.  Call the office if problems occur. Debridement of nails  X 10.  RTC 10 weeks.   Gardiner Barefoot DPM

## 2017-11-26 DIAGNOSIS — F1120 Opioid dependence, uncomplicated: Secondary | ICD-10-CM | POA: Diagnosis not present

## 2017-12-08 DIAGNOSIS — F1120 Opioid dependence, uncomplicated: Secondary | ICD-10-CM | POA: Diagnosis not present

## 2017-12-14 MED FILL — AMLODIPINE BESYLATE 5 MG TA: 5 | 60 days supply | Qty: 60 | Fill #1

## 2017-12-14 MED FILL — ATORVASTATIN 40 MG TABLET: 40 | 30 days supply | Qty: 30 | Fill #7

## 2017-12-14 MED FILL — METFORMIN HCL ER 500 MG TAB: 500 | 30 days supply | Qty: 120 | Fill #1

## 2017-12-14 MED FILL — LOSARTAN POTASSIUM 100 MG T: 100 | 30 days supply | Qty: 30 | Fill #10

## 2017-12-14 MED FILL — LANTUS 100 UNITS/ML VIAL: 100 | 25 days supply | Qty: 10 | Fill #6

## 2017-12-14 MED FILL — FENOFIBRATE 145 MG TABLET: 145 | 30 days supply | Qty: 30 | Fill #7

## 2017-12-24 DIAGNOSIS — F1120 Opioid dependence, uncomplicated: Secondary | ICD-10-CM | POA: Diagnosis not present

## 2018-01-07 DIAGNOSIS — F1120 Opioid dependence, uncomplicated: Secondary | ICD-10-CM | POA: Diagnosis not present

## 2018-01-15 ENCOUNTER — Ambulatory Visit (INDEPENDENT_AMBULATORY_CARE_PROVIDER_SITE_OTHER): Payer: Self-pay | Admitting: Podiatry

## 2018-01-15 ENCOUNTER — Encounter: Payer: Self-pay | Admitting: Podiatry

## 2018-01-15 DIAGNOSIS — M79674 Pain in right toe(s): Secondary | ICD-10-CM

## 2018-01-15 DIAGNOSIS — M79675 Pain in left toe(s): Secondary | ICD-10-CM

## 2018-01-15 DIAGNOSIS — E1142 Type 2 diabetes mellitus with diabetic polyneuropathy: Secondary | ICD-10-CM

## 2018-01-15 DIAGNOSIS — B351 Tinea unguium: Secondary | ICD-10-CM

## 2018-01-15 NOTE — Progress Notes (Signed)
Complaint:  Visit Type: Patient returns to my office for continued preventative foot care services. Complaint: Patient states" his nails have grown thick and long are painful walking and wearing his shoes.  Patient is diabetic with neuropathy.  Patient presents for preventative foot care services..  Podiatric Exam: Vascular: dorsalis pedis and posterior tibial pulses are palpable bilateral. Capillary return is immediate. Temperature gradient is WNL. Skin turgor WNL  Sensorium: Diminished Semmes Weinstein monofilament test. Normal tactile sensation bilaterally. Nail Exam: Pt has thick disfigured discolored nails with subungual debris noted bilateral entire nail hallux through fifth toenails Ulcer Exam: There is significant hemorrhagic callus noted on the medial aspect of the right hallux. No redness or swelling or drainage or bleeding noted Orthopedic Exam: Muscle tone and strength are WNL. No limitations in general ROM. No crepitus or effusions noted. Foot type and digits show no abnormalities. Bony prominences are unremarkable. Skin: No Porokeratosis. No infection or ulcers.    Diagnosis:  Onychomycosis, , Pain in right toe, pain in left toes,   Treatment & Plan Procedures and Treatment: Consent by patient was obtained for treatment procedures. The patient understood the discussion of treatment and procedures well. All questions were answered thoroughly reviewed. Debridement of mycotic and hypertrophic toenails, 1 through 5 bilateral and clearing of subungual debris. No ulceration, no infection noted.   Return Visit-Office Procedure: Patient instructed to return to the office for a follow up visit  10  weeks.for continued evaluation and treatment.    Gardiner Barefoot DPM

## 2018-01-21 ENCOUNTER — Other Ambulatory Visit: Payer: Self-pay | Admitting: Cardiology

## 2018-01-21 MED FILL — AMLODIPINE BESYLATE 5 MG TA: 5 | 30 days supply | Qty: 30 | Fill #0

## 2018-01-21 MED FILL — LOSARTAN POTASSIUM 100 MG T: 100 | 30 days supply | Qty: 30 | Fill #0

## 2018-01-21 MED FILL — ATORVASTATIN 40 MG TABLET: 40 | 30 days supply | Qty: 30 | Fill #8

## 2018-01-21 MED FILL — METFORMIN HCL ER 500 MG TAB: 500 | 30 days supply | Qty: 120 | Fill #2

## 2018-01-21 MED FILL — FENOFIBRATE 145 MG TABLET: 145 | 30 days supply | Qty: 30 | Fill #8

## 2018-02-23 ENCOUNTER — Other Ambulatory Visit: Payer: Self-pay | Admitting: Cardiology

## 2018-02-24 MED FILL — AMLODIPINE BESYLATE 5 MG TA: 5 | 30 days supply | Qty: 30 | Fill #0

## 2018-02-24 MED FILL — ATORVASTATIN 40 MG TABLET: 40 | 30 days supply | Qty: 30 | Fill #9

## 2018-02-24 MED FILL — FENOFIBRATE 145 MG TABLET: 145 | 30 days supply | Qty: 30 | Fill #9

## 2018-02-24 MED FILL — METFORMIN HCL ER 500 MG TAB: 500 | 30 days supply | Qty: 120 | Fill #3

## 2018-02-24 MED FILL — LOSARTAN POTASSIUM 100 MG T: 100 | 30 days supply | Qty: 30 | Fill #0

## 2018-03-19 ENCOUNTER — Ambulatory Visit: Payer: Self-pay | Attending: Family Medicine | Admitting: Family Medicine

## 2018-03-19 ENCOUNTER — Encounter: Payer: Self-pay | Admitting: Family Medicine

## 2018-03-19 VITALS — BP 138/70 | HR 56 | Temp 98.6°F | Resp 18 | Ht 74.0 in | Wt 260.0 lb

## 2018-03-19 DIAGNOSIS — R600 Localized edema: Secondary | ICD-10-CM

## 2018-03-19 DIAGNOSIS — Z794 Long term (current) use of insulin: Secondary | ICD-10-CM

## 2018-03-19 DIAGNOSIS — I10 Essential (primary) hypertension: Secondary | ICD-10-CM

## 2018-03-19 DIAGNOSIS — E1142 Type 2 diabetes mellitus with diabetic polyneuropathy: Secondary | ICD-10-CM

## 2018-03-19 DIAGNOSIS — L03116 Cellulitis of left lower limb: Secondary | ICD-10-CM

## 2018-03-19 DIAGNOSIS — L03115 Cellulitis of right lower limb: Secondary | ICD-10-CM

## 2018-03-19 LAB — POCT GLYCOSYLATED HEMOGLOBIN (HGB A1C): Hemoglobin A1C: 7.8 % — AB (ref 4.0–5.6)

## 2018-03-19 MED ORDER — POTASSIUM CHLORIDE ER 10 MEQ PO TBCR: 10.0000 meq | EXTENDED_RELEASE_TABLET | Freq: Every day | ORAL | 0 refills | Status: DC

## 2018-03-19 MED ORDER — AMOXICILLIN-POT CLAVULANATE 500-125 MG PO TABS: 1.0000 | ORAL_TABLET | Freq: Two times a day (BID) | ORAL | 0 refills | Status: DC

## 2018-03-19 MED ORDER — FUROSEMIDE 20 MG PO TABS: ORAL_TABLET | ORAL | 0 refills | Status: DC

## 2018-03-19 MED FILL — AMOX-CLAV 500-125 MG TABLET: 500-125 | 7 days supply | Qty: 14 | Fill #0

## 2018-03-19 MED FILL — POTASSIUM CHLORIDE ER 10 ME: 10 | 30 days supply | Qty: 30 | Fill #0

## 2018-03-19 MED FILL — FUROSEMIDE 20 MG TABLET: 20 | 30 days supply | Qty: 34 | Fill #0

## 2018-03-19 NOTE — Patient Instructions (Signed)

## 2018-03-19 NOTE — Progress Notes (Signed)
Patient complains of falling asleep in the recliner sitting up. Patient complains of bilateral leg edema for the past 8-9 days due to this. Patient states this occurs each time he does this.

## 2018-03-19 NOTE — Progress Notes (Signed)
Established Patient Office Visit  Subjective:  Patient ID: [name] [name], male    DOB: March 23, 1960  Age: 58 y.o. MRN: [id number]  CC: swelling in lower legs reviewed  Wound  HPI [name] [name] presents for complaint of bilateral swelling in his lower legs for the past 7-8 days. He is a patient of Dr. Durenda Age and was last seen in the office 07/24/2017 in follow-up of DM and other chronic medical issues.  Patient reports that he was started on a new muscle relaxant by his pain management doctor and he has been falling asleep in his recliner in a non-reclined position so that his legs are not elevated. Patient states that the swelling was worse yesterday and his legs felt tight due to the swelling. No calf/leg pain. No chest pain or SOB. Legs feel less tight and less swollen today. No fever or chills. No N/V/D/C. Patient reports compliance with his medications including medications for DM, HTN  and cholesterol but he is not sure if he is still taking a fluid pill. Patient denies any history of DVT, renal issues/chronic kidney disease and has not been told in the past that he needs to wear compression hose/stockings but states that his brother wears these currently.  Past Medical History:  Diagnosis Date  . Anxiety   . Arthritis   . Blood transfusion without reported diagnosis   . Cataract   . Depressed bipolar disorder ([place])   . Depression   . Diabetes mellitus without complication ([place])   . Diabetic neuropathy ([place])   . GERD (gastroesophageal reflux disease)   . Herpes zoster 12/05/2008   Qualifier: Diagnosis of  By: Ronnald Ramp MD, Arvid Right.   . 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  Coupled with high doses of loperamide used for pain control.  Unintentional overdose -intractable VT leading to cardiogenic shock - ECMO  . Hyperlipidemia   . Hypertension   . Neuromuscular disorder ([place])    nerve damage back, neck,  and shoulder  . Neuropathy in diabetes ([place])   . Sleep apnea    has CPAP but cannot use it  . Substance abuse ([place])    in past     Past Surgical History:  Procedure Laterality Date  . COLONOSCOPY    . 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 INTRAOPERATIVE TRANSESOPHAGEAL ECHOCARDIOGRAM N/A 11/26/2016   Procedure: INTRAOPERATIVE TRANSESOPHAGEAL ECHOCARDIOGRAM;  Surgeon: Ivin Poot, MD;  Location: [facility];  Service: Open Heart Surgery;  Laterality: N/A;  . POLYPECTOMY    . SHOULDER ARTHROSCOPY WITH ROTATOR CUFF REPAIR Right 2009  . [place]  2010  . TRANSTHORACIC ECHOCARDIOGRAM  07/2016; 12/10/2016   a. Prior to VT arrest: normal. EF 55-60%. Gr 1 DD.  mild LVH. Mildly dilated Aortic Root.;; b.  Normal LV size and function.  Mild LVH.  EF 55%.  No RWMA.  No valve abnormalities.    Family History  Problem Relation Age of Onset  . Diabetes Mother   . Hyperlipidemia Mother   . Heart disease Father   . Colon cancer Neg Hx   . Colon polyps Neg Hx   . Esophageal cancer Neg Hx   . Rectal cancer Neg Hx   . Stomach cancer Neg Hx     Social History   Socioeconomic History  . Marital status: Single  Spouse name: Not on file  . Number of children: Not on file  . Years of education: Not on file  . Highest education level: Not on file  Occupational History  . Not on file  Social Needs  . Financial resource strain: Not on file  . Food insecurity:    Worry: Not on file    Inability: Not on file  . Transportation needs:    Medical: Not on file    Non-medical: Not on file  Tobacco Use  . Smoking status: Never Smoker  . Smokeless tobacco: Never Used  Substance and Sexual Activity  . Alcohol use: No  . Drug use: No  . Sexual activity: Not on file  Lifestyle  . Physical activity:    Days per week: Not on file    Minutes per session: Not on file  . Stress: Not on file    Relationships  . Social connections:    Talks on phone: Not on file    Gets together: Not on file    Attends religious service: Not on file    Active member of club or organization: Not on file    Attends meetings of clubs or organizations: Not on file    Relationship status: Not on file  . Intimate partner violence:    Fear of current or ex partner: Not on file    Emotionally abused: Not on file    Physically abused: Not on file    Forced sexual activity: Not on file  Other Topics Concern  . Not on file  Social History Narrative  . Not on file    Outpatient Medications Prior to Visit  Medication Sig Dispense Refill  . amLODipine (NORVASC) 5 MG tablet TAKE 1 TABLET BY MOUTH DAILY (NEEDS OFFICE VISIT FOR FURTHER REFILLS) 30 tablet 0  . ARIPiprazole (ABILIFY) 5 MG tablet Take 5 mg by mouth at bedtime.  5  . atorvastatin (LIPITOR) 40 MG tablet Take 1 tablet (40 mg total) by mouth daily at 6 PM. 30 tablet 11  . buprenorphine (SUBUTEX) 8 MG SUBL SL tablet DISSOLVE 1 T UNDER THE TONGUE QID  0  . buprenorphine-naloxone (SUBOXONE) 8-2 mg SUBL SL tablet Place 1 tablet under the tongue every 8 (eight) hours.    Marland Kitchen buPROPion (WELLBUTRIN SR) 150 MG 12 hr tablet Take by mouth.    . cyclobenzaprine (FLEXERIL) 10 MG tablet TK 1 T PO  QHS  5  . fenofibrate (TRICOR) 145 MG tablet Take 1 tablet (145 mg total) by mouth daily. 30 tablet 11  . furosemide (LASIX) 20 MG tablet TAKE 1 TABLET BY MOUTH DAILY. 30 tablet 2  . glucose blood test strip Use as instructed    . glucose monitoring kit (FREESTYLE) monitoring kit by Does not apply route.    . insulin glargine (LANTUS) 100 UNIT/ML injection Inject 0.4 mLs (40 Units total) into the skin at bedtime. 20 mL 11  . Lancets (FREESTYLE) lancets Use as instructed    . lansoprazole (PREVACID) 15 MG capsule Take 15 mg daily as needed by mouth (for heartburn).     . losartan (COZAAR) 100 MG tablet TAKE 1 TABLET (100 MG TOTAL) BY MOUTH DAILY. (NEEDS AND OFFICE  VIST FOR FURTHER REFILLS) 30 tablet 0  . metFORMIN (GLUCOPHAGE XR) 500 MG 24 hr tablet Take 2 tablets (1,000 mg total) by mouth 2 (two) times daily. 120 tablet 6  . methocarbamol (ROBAXIN) 750 MG tablet TAKE 2 TABLETS BY MOUTH 4 TIMES A DAY  5  . prazosin (MINIPRESS) 2 MG capsule TK ONE C PO  QHS  5  . QUEtiapine (SEROQUEL) 300 MG tablet Take 0.5 tablets (150 mg total) by mouth at bedtime. 30 tablet 6  . traMADol (ULTRAM) 50 MG tablet Take 1 tablet by mouth as needed.  5   No facility-administered medications prior to visit.     Allergies  Allergen Reactions  . Heparin Other (See Comments)    HIT Ab negative on 02/20/15, but SRA POSITIVE   . Oxytetracycline Rash and Other (See Comments)  . Sulfa Antibiotics Other (See Comments)  . Other     Mycins  . Sulfonamide Derivatives Rash    ROS Review of Systems  Constitutional: Positive for fatigue. Negative for chills and fever.  Respiratory: Negative for cough and shortness of breath.   Cardiovascular: Positive for leg swelling. Negative for chest pain and palpitations.  Gastrointestinal: Negative for abdominal pain, constipation, diarrhea and nausea.  Endocrine: Negative for polydipsia, polyphagia and polyuria.  Genitourinary: Negative for dysuria and frequency.  Musculoskeletal: Positive for arthralgias and back pain.  Neurological: Negative for dizziness and light-headedness.  Hematological: Negative for adenopathy. Does not bruise/bleed easily.      Objective:    Physical Exam  Constitutional: He is oriented to person, place, and time. He appears well-developed and well-nourished. No distress.  Overweight/obese male  Neck: Normal range of motion. Neck supple. No JVD present.  Cardiovascular: Normal rate and regular rhythm.  Pulmonary/Chest: Effort normal and breath sounds normal. No respiratory distress. He has no rales.  Abdominal: Soft. There is no abdominal tenderness.  Truncal obesity  Musculoskeletal:        General:  Edema present.  Neurological: He is alert and oriented to person, place, and time.  Skin: Skin is warm and dry. There is erythema.     Psychiatric: He has a normal mood and affect. His behavior is normal. Judgment and thought content normal.   BP 138/70 (BP Location: Left Arm, Patient Position: Sitting, Cuff Size: Normal)   Pulse (!) 56   Temp 98.6 F (37 C) (Oral)   Resp 18   Ht '6\' 2"'$  (1.88 m)   Wt 260 lb (117.9 kg)   SpO2 100%   BMI 33.38 kg/m   Wt Readings from Last 3 Encounters:  07/24/17 252 lb (114.3 kg)  04/28/17 254 lb 3.2 oz (115.3 kg)  02/03/17 254 lb 6.4 oz (115.4 kg)     Health Maintenance Due  Topic Date Due  . COLONOSCOPY  08/31/2015  . INFLUENZA VACCINE  08/13/2017  . OPHTHALMOLOGY EXAM  09/04/2017  . HEMOGLOBIN A1C  01/24/2018    There are no preventive care reminders to display for this patient.  Lab Results  Component Value Date   TSH 1.186 08/05/2013   Lab Results  Component Value Date   WBC 5.1 02/03/2017   HGB 12.3 (L) 02/03/2017   HCT 37.2 (L) 02/03/2017   MCV 79 02/03/2017   PLT 196 02/03/2017   Lab Results  Component Value Date   NA 139 07/24/2017   K 3.8 07/24/2017   CO2 23 07/24/2017   GLUCOSE 181 (H) 07/24/2017   BUN 21 07/24/2017   CREATININE 1.21 07/24/2017   BILITOT 0.3 02/03/2017   ALKPHOS 50 02/03/2017   AST 18 02/03/2017   ALT 21 02/03/2017   PROT 7.0 02/03/2017   ALBUMIN 4.7 02/03/2017   CALCIUM 9.5 07/24/2017   ANIONGAP 13 11/26/2016   Lab Results  Component Value Date  CHOL 123 02/03/2017   Lab Results  Component Value Date   HDL 37 (L) 02/03/2017   Lab Results  Component Value Date   LDLCALC 54 02/03/2017   Lab Results  Component Value Date   TRIG 161 (H) 02/03/2017   Lab Results  Component Value Date   CHOLHDL 3.3 02/03/2017   Lab Results  Component Value Date   HGBA1C 9.5 (H) 07/24/2017      Assessment & Plan:  1. Edema of both lower extremities Patient with bilateral peripheral edema  which is likely related to dependent edema from falling asleep in his recliner with his feet down. He likely also has some venous insufficiency. On review of chart patient with echo in 2018 during hospitalization with evidence of grade 1 diastolic dysfunction and LVH. He also appears to have some cellulitis which may have contributed to his to the edema.  No indication that further testing needed for evaluation for DVT as patient does not have any complaint of leg pain and no reproducible leg pain on examination.  Will also check BMP to make sure that renal function is not impaired and check TSH to look for thyroid disorder that may be contributing to his edema. Patient given new prescription for lasix and instructed to take twice per day for 4 days then resume prior once per day dosing and potassium added to avoid hypokalemia with use of lasix. He should follow-up in 1 week with his PCP to make sure that swelling and cellulitis have improved or resolved.  - Basic Metabolic Panel - TSH - furosemide (LASIX) 20 MG tablet; Take one pill twice per day for 4 days then resume once daily use  Dispense: 34 tablet; Refill: 0 - potassium chloride (K-DUR) 10 MEQ tablet; Take 1 tablet (10 mEq total) by mouth daily. With use of lasix/furosemide  Dispense: 30 tablet; Refill: 0  2. Cellulitis of both lower extremities Patient with evidence of cellulitis on exam an d prescription provided for Augmentin for treatment. Education on cellulitis provided as part of AVS - amoxicillin-clavulanate (AUGMENTIN) 500-125 MG tablet; Take 1 tablet (500 mg total) by mouth 2 (two) times daily for 7 days. Take after eating  Dispense: 14 tablet; Refill: 0  3. Type 2 diabetes mellitus with diabetic polyneuropathy, with long-term current use of insulin ([place]) Patient has not had recent visit in follow-up of diabetes therefore hemoglobin A1c done at today's visit.  Hemoglobin A1c was 7.8 which is improved from last hemoglobin A1c of 9.3 on  07/24/2017.  He is encouraged to continue healthy diet choices, continue use of metformin XR and follow-up next week with his primary care provider. - HgB D3O - Basic Metabolic Panel - TSH  An After Visit Summary was printed and given to the patient.  Allergies as of 03/19/2018      Reactions   Heparin Other (See Comments)   HIT Ab negative on 02/20/15, but SRA POSITIVE    Oxytetracycline Rash, Other (See Comments)   Sulfa Antibiotics Other (See Comments)   Other    Mycins   Sulfonamide Derivatives Rash      Medication List       Accurate as of March 19, 2018  9:20 AM. Always use your most recent med list.        amLODipine 5 MG tablet Commonly known as:  NORVASC TAKE 1 TABLET BY MOUTH DAILY (NEEDS OFFICE VISIT FOR FURTHER REFILLS)   amoxicillin-clavulanate 500-125 MG tablet Commonly known as:  Augmentin Take 1 tablet (  500 mg total) by mouth 2 (two) times daily for 7 days. Take after eating   ARIPiprazole 5 MG tablet Commonly known as:  ABILIFY Take 5 mg by mouth at bedtime.   atorvastatin 40 MG tablet Commonly known as:  LIPITOR Take 1 tablet (40 mg total) by mouth daily at 6 PM.   buprenorphine 8 MG Subl SL tablet Commonly known as:  SUBUTEX DISSOLVE 1 T UNDER THE TONGUE QID   buprenorphine-naloxone 8-2 mg Subl SL tablet Commonly known as:  SUBOXONE Place 1 tablet under the tongue every 8 (eight) hours.   buPROPion 150 MG 12 hr tablet Commonly known as:  WELLBUTRIN SR Take by mouth.   cyclobenzaprine 10 MG tablet Commonly known as:  FLEXERIL TK 1 T PO  QHS   fenofibrate 145 MG tablet Commonly known as:  Tricor Take 1 tablet (145 mg total) by mouth daily.   freestyle lancets Use as instructed   furosemide 20 MG tablet Commonly known as:  LASIX Take one pill twice per day for 4 days then resume once daily use   glucose blood test strip Use as instructed   glucose monitoring kit monitoring kit by Does not apply route.   insulin glargine 100 UNIT/ML  injection Commonly known as:  Lantus Inject 0.4 mLs (40 Units total) into the skin at bedtime.   lansoprazole 15 MG capsule Commonly known as:  PREVACID Take 15 mg daily as needed by mouth (for heartburn).   losartan 100 MG tablet Commonly known as:  COZAAR TAKE 1 TABLET (100 MG TOTAL) BY MOUTH DAILY. (NEEDS AND OFFICE VIST FOR FURTHER REFILLS)   metFORMIN 500 MG 24 hr tablet Commonly known as:  Glucophage XR Take 2 tablets (1,000 mg total) by mouth 2 (two) times daily.   methocarbamol 750 MG tablet Commonly known as:  ROBAXIN TAKE 2 TABLETS BY MOUTH 4 TIMES A DAY   potassium chloride 10 MEQ tablet Commonly known as:  K-DUR Take 1 tablet (10 mEq total) by mouth daily. With use of lasix/furosemide   prazosin 2 MG capsule Commonly known as:  MINIPRESS TK ONE C PO  QHS   QUEtiapine 300 MG tablet Commonly known as:  SEROQUEL Take 0.5 tablets (150 mg total) by mouth at bedtime.   traMADol 50 MG tablet Commonly known as:  ULTRAM Take 1 tablet by mouth as needed.      Return in about 1 week (around 03/26/2018) for cellulitis/leg sweliing-1 week with Dr. Wynetta Emery.   Follow-up: Return in about 1 week (around 03/26/2018) for cellulitis/leg sweliing-1 week with Dr. Wynetta Emery.   Antony Blackbird, MD

## 2018-03-20 LAB — BASIC METABOLIC PANEL WITH GFR
BUN/Creatinine Ratio: 15 (ref 9–20)
BUN: 16 mg/dL (ref 6–24)
CO2: 23 mmol/L (ref 20–29)
Calcium: 8.9 mg/dL (ref 8.7–10.2)
Chloride: 101 mmol/L (ref 96–106)
Creatinine, Ser: 1.08 mg/dL (ref 0.76–1.27)
GFR calc Af Amer: 88 mL/min/1.73
GFR calc non Af Amer: 76 mL/min/1.73
Glucose: 235 mg/dL — ABNORMAL HIGH (ref 65–99)
Potassium: 3.9 mmol/L (ref 3.5–5.2)
Sodium: 140 mmol/L (ref 134–144)

## 2018-03-20 LAB — TSH: TSH: 3.78 u[IU]/mL (ref 0.450–4.500)

## 2018-03-21 ENCOUNTER — Encounter: Payer: Self-pay | Admitting: Family Medicine

## 2018-03-22 ENCOUNTER — Telehealth: Payer: Self-pay | Admitting: *Deleted

## 2018-03-22 NOTE — Telephone Encounter (Signed)
Patient verified DOB Patient is aware of labs being normal and needing to adhere to DM medications to address elevated glucose level.

## 2018-03-22 NOTE — Telephone Encounter (Signed)
-----   Message from Antony Blackbird, MD sent at 03/20/2018  8:15 PM EST ----- Please notify patient of normal BMP with exception of glucose of 235 and normal TSH

## 2018-03-24 ENCOUNTER — Other Ambulatory Visit: Payer: Self-pay | Admitting: Internal Medicine

## 2018-03-24 DIAGNOSIS — E785 Hyperlipidemia, unspecified: Secondary | ICD-10-CM

## 2018-03-26 ENCOUNTER — Encounter: Payer: Self-pay | Admitting: Internal Medicine

## 2018-03-26 ENCOUNTER — Ambulatory Visit: Payer: Medicare Other | Attending: Internal Medicine | Admitting: Internal Medicine

## 2018-03-26 ENCOUNTER — Other Ambulatory Visit: Payer: Self-pay

## 2018-03-26 VITALS — BP 112/69 | HR 55 | Temp 98.2°F | Resp 16 | Wt 250.6 lb

## 2018-03-26 DIAGNOSIS — E1142 Type 2 diabetes mellitus with diabetic polyneuropathy: Secondary | ICD-10-CM | POA: Diagnosis not present

## 2018-03-26 DIAGNOSIS — M542 Cervicalgia: Secondary | ICD-10-CM

## 2018-03-26 DIAGNOSIS — E785 Hyperlipidemia, unspecified: Secondary | ICD-10-CM

## 2018-03-26 DIAGNOSIS — M545 Low back pain: Secondary | ICD-10-CM

## 2018-03-26 DIAGNOSIS — R600 Localized edema: Secondary | ICD-10-CM

## 2018-03-26 DIAGNOSIS — E669 Obesity, unspecified: Secondary | ICD-10-CM

## 2018-03-26 DIAGNOSIS — Z794 Long term (current) use of insulin: Secondary | ICD-10-CM | POA: Diagnosis not present

## 2018-03-26 DIAGNOSIS — G8929 Other chronic pain: Secondary | ICD-10-CM

## 2018-03-26 DIAGNOSIS — I10 Essential (primary) hypertension: Secondary | ICD-10-CM

## 2018-03-26 DIAGNOSIS — R682 Dry mouth, unspecified: Secondary | ICD-10-CM

## 2018-03-26 LAB — GLUCOSE, POCT (MANUAL RESULT ENTRY): POC Glucose: 359 mg/dl — AB (ref 70–99)

## 2018-03-26 MED ORDER — SITAGLIPTIN PHOSPHATE 50 MG PO TABS: 50.0000 mg | ORAL_TABLET | Freq: Every day | ORAL | 6 refills | Status: DC

## 2018-03-26 MED ORDER — HYDROCHLOROTHIAZIDE 25 MG PO TABS: 25.0000 mg | ORAL_TABLET | Freq: Every day | ORAL | 3 refills | Status: DC

## 2018-03-26 MED ORDER — LOSARTAN POTASSIUM 100 MG PO TABS: ORAL_TABLET | ORAL | 6 refills | Status: DC

## 2018-03-26 MED FILL — JANUVIA 50 MG TABLET: 50 | 30 days supply | Qty: 30 | Fill #0

## 2018-03-26 MED FILL — LOSARTAN POTASSIUM 100 MG T: 100 | 90 days supply | Qty: 90 | Fill #0

## 2018-03-26 MED FILL — HYDROCHLOROTHIAZIDE 25 MG T: 25 | 90 days supply | Qty: 90 | Fill #0
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
## 2018-03-26 NOTE — Patient Instructions (Signed)
Stop amlodipine.  This may be contributing to the swelling in the legs.  We have changed you to a new blood pressure medication called hydrochlorothiazide 25 mg once a day.  Continue to limit salt in the foods.  Stop furosemide.  Continue metformin.  We have added a new diabetes medication called Januvia.  I have taken the Lantus off of your list.  Try mouthwash over-the-counter cold Biotene to help with dry mouth.

## 2018-03-27 LAB — LIPID PANEL
Chol/HDL Ratio: 4.1 ratio (ref 0.0–5.0)
Cholesterol, Total: 128 mg/dL (ref 100–199)
HDL: 31 mg/dL — ABNORMAL LOW (ref >39)
LDL Calculated: 68 mg/dL (ref 0–99)
Triglycerides: 145 mg/dL (ref 0–149)
VLDL Cholesterol Cal: 29 mg/dL (ref 5–40)

## 2018-03-27 LAB — COMPREHENSIVE METABOLIC PANEL
ALT: 19 IU/L (ref 0–44)
AST: 16 IU/L (ref 0–40)
Albumin/Globulin Ratio: 1.9 (ref 1.2–2.2)
Albumin: 4.5 g/dL (ref 3.8–4.9)
Alkaline Phosphatase: 47 IU/L (ref 39–117)
BUN/Creatinine Ratio: 19 (ref 9–20)
BUN: 23 mg/dL (ref 6–24)
Bilirubin Total: 0.2 mg/dL (ref 0.0–1.2)
CO2: 22 mmol/L (ref 20–29)
Calcium: 9.2 mg/dL (ref 8.7–10.2)
Chloride: 100 mmol/L (ref 96–106)
Creatinine, Ser: 1.2 mg/dL (ref 0.76–1.27)
GFR calc Af Amer: 78 mL/min/{1.73_m2} (ref >59)
GFR calc non Af Amer: 67 mL/min/{1.73_m2} (ref >59)
Globulin, Total: 2.4 g/dL (ref 1.5–4.5)
Glucose: 360 mg/dL — ABNORMAL HIGH (ref 65–99)
Potassium: 4 mmol/L (ref 3.5–5.2)
Sodium: 140 mmol/L (ref 134–144)
Total Protein: 6.9 g/dL (ref 6.0–8.5)

## 2018-03-27 LAB — SJOGREN'S SYNDROME ANTIBODS(SSA + SSB)
ENA SSA (RO) Ab: <0.2 AI (ref 0.0–0.9)
ENA SSB (LA) Ab: <0.2 AI (ref 0.0–0.9)

## 2018-03-27 LAB — BRAIN NATRIURETIC PEPTIDE: BNP: 44.8 pg/mL (ref 0.0–100.0)

## 2018-03-29 MED FILL — FENOFIBRATE 145 MG TABLET: 145 | 90 days supply | Qty: 90 | Fill #0

## 2018-03-29 MED FILL — ATORVASTATIN CALCIUM 40 MG: 40 | 90 days supply | Qty: 90 | Fill #0

## 2018-04-01 MED FILL — METFORMIN HCL ER 500 MG TAB: 500 | 30 days supply | Qty: 120 | Fill #4

## 2018-04-02 ENCOUNTER — Other Ambulatory Visit: Payer: Self-pay

## 2018-04-02 ENCOUNTER — Encounter: Payer: Self-pay | Admitting: Podiatry

## 2018-04-02 ENCOUNTER — Ambulatory Visit: Payer: Medicare Other | Admitting: Podiatry

## 2018-04-02 DIAGNOSIS — M79674 Pain in right toe(s): Secondary | ICD-10-CM

## 2018-04-02 DIAGNOSIS — E1142 Type 2 diabetes mellitus with diabetic polyneuropathy: Secondary | ICD-10-CM

## 2018-04-02 DIAGNOSIS — B351 Tinea unguium: Secondary | ICD-10-CM

## 2018-04-02 DIAGNOSIS — M79675 Pain in left toe(s): Secondary | ICD-10-CM

## 2018-04-02 NOTE — Progress Notes (Signed)
Complaint:  Visit Type: Patient returns to my office for continued preventative foot care services. Complaint: Patient states" his nails have grown thick and long are painful walking and wearing his shoes.  Patient is diabetic with neuropathy.  Patient presents for preventative foot care services..  Podiatric Exam: Vascular: dorsalis pedis and posterior tibial pulses are palpable bilateral. Capillary return is immediate. Temperature gradient is WNL. Skin turgor WNL  Sensorium: Diminished Semmes Weinstein monofilament test. Normal tactile sensation bilaterally. Nail Exam: Pt has thick disfigured discolored nails with subungual debris noted bilateral entire nail hallux through fifth toenails Ulcer Exam: There is no evidence of ulcer hallux  B/L. Orthopedic Exam: Muscle tone and strength are WNL. No limitations in general ROM. No crepitus or effusions noted. Foot type and digits show no abnormalities. Bony prominences are unremarkable. Mild swelling noted Skin: No Porokeratosis. No infection or ulcers.    Diagnosis:  Onychomycosis, , Pain in right toe, pain in left toes,   Treatment & Plan Procedures and Treatment: Consent by patient was obtained for treatment procedures. The patient understood the discussion of treatment and procedures well. All questions were answered thoroughly reviewed. Debridement of mycotic and hypertrophic toenails, 1 through 5 bilateral and clearing of subungual debris. No ulceration, no infection noted.   Return Visit-Office Procedure: Patient instructed to return to the office for a follow up visit  10  weeks.for continued evaluation and treatment.    Janie Strothman DPM 

## 2018-05-04 ENCOUNTER — Other Ambulatory Visit: Payer: Self-pay | Admitting: Internal Medicine

## 2018-05-04 DIAGNOSIS — E785 Hyperlipidemia, unspecified: Secondary | ICD-10-CM

## 2018-05-04 MED FILL — METFORMIN HCL ER 500 MG TAB: 500 | 30 days supply | Qty: 120 | Fill #5

## 2018-05-19 ENCOUNTER — Encounter: Payer: Self-pay | Admitting: Internal Medicine

## 2018-05-19 ENCOUNTER — Other Ambulatory Visit: Payer: Self-pay

## 2018-05-19 ENCOUNTER — Ambulatory Visit: Payer: Medicare Other | Attending: Internal Medicine | Admitting: Internal Medicine

## 2018-05-19 DIAGNOSIS — R682 Dry mouth, unspecified: Secondary | ICD-10-CM

## 2018-05-19 DIAGNOSIS — E1142 Type 2 diabetes mellitus with diabetic polyneuropathy: Secondary | ICD-10-CM | POA: Diagnosis not present

## 2018-05-19 DIAGNOSIS — R600 Localized edema: Secondary | ICD-10-CM | POA: Diagnosis not present

## 2018-05-19 DIAGNOSIS — I10 Essential (primary) hypertension: Secondary | ICD-10-CM

## 2018-05-19 DIAGNOSIS — Z794 Long term (current) use of insulin: Secondary | ICD-10-CM

## 2018-05-19 MED ORDER — BLOOD PRESSURE MONITOR DEVI: 0 refills | Status: DC
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
## 2018-05-19 NOTE — Progress Notes (Signed)
Pt states his swelling has gone.   Pt states only when he wear regular socks it leaves a little ring

## 2018-05-21 ENCOUNTER — Ambulatory Visit: Payer: Medicare Other | Attending: Internal Medicine

## 2018-05-21 ENCOUNTER — Other Ambulatory Visit: Payer: Self-pay

## 2018-05-21 DIAGNOSIS — I10 Essential (primary) hypertension: Secondary | ICD-10-CM

## 2018-05-22 LAB — BASIC METABOLIC PANEL
BUN/Creatinine Ratio: 17 (ref 9–20)
BUN: 19 mg/dL (ref 6–24)
CO2: 26 mmol/L (ref 20–29)
Calcium: 9.5 mg/dL (ref 8.7–10.2)
Chloride: 98 mmol/L (ref 96–106)
Creatinine, Ser: 1.13 mg/dL (ref 0.76–1.27)
GFR calc Af Amer: 84 mL/min/{1.73_m2} (ref >59)
GFR calc non Af Amer: 72 mL/min/{1.73_m2} (ref >59)
Glucose: 187 mg/dL — ABNORMAL HIGH (ref 65–99)
Potassium: 4.2 mmol/L (ref 3.5–5.2)
Sodium: 140 mmol/L (ref 134–144)

## 2018-06-11 ENCOUNTER — Other Ambulatory Visit: Payer: Self-pay

## 2018-06-11 ENCOUNTER — Ambulatory Visit: Payer: Medicare Other | Admitting: Podiatry

## 2018-06-11 ENCOUNTER — Encounter: Payer: Self-pay | Admitting: Podiatry

## 2018-06-11 VITALS — Temp 97.7°F

## 2018-06-11 DIAGNOSIS — E1142 Type 2 diabetes mellitus with diabetic polyneuropathy: Secondary | ICD-10-CM

## 2018-06-11 DIAGNOSIS — M79674 Pain in right toe(s): Secondary | ICD-10-CM

## 2018-06-11 DIAGNOSIS — B351 Tinea unguium: Secondary | ICD-10-CM | POA: Diagnosis not present

## 2018-06-11 DIAGNOSIS — M79675 Pain in left toe(s): Secondary | ICD-10-CM

## 2018-06-11 NOTE — Progress Notes (Signed)
Complaint:  Visit Type: Patient returns to my office for continued preventative foot care services. Complaint: Patient states" his nails have grown thick and long are painful walking and wearing his shoes.  Patient is diabetic with neuropathy.  Patient presents for preventative foot care services..  Podiatric Exam: Vascular: dorsalis pedis and posterior tibial pulses are palpable bilateral. Capillary return is immediate. Temperature gradient is WNL. Skin turgor WNL  Sensorium: Diminished Semmes Weinstein monofilament test. Normal tactile sensation bilaterally. Nail Exam: Pt has thick disfigured discolored nails with subungual debris noted bilateral entire nail hallux through fifth toenails Ulcer Exam: There is no evidence of ulcer hallux  B/L. Orthopedic Exam: Muscle tone and strength are WNL. No limitations in general ROM. No crepitus or effusions noted. Foot type and digits show no abnormalities. Bony prominences are unremarkable. Mild swelling noted Skin: No Porokeratosis. No infection or ulcers.    Diagnosis:  Onychomycosis, , Pain in right toe, pain in left toes,   Treatment & Plan Procedures and Treatment: Consent by patient was obtained for treatment procedures. The patient understood the discussion of treatment and procedures well. All questions were answered thoroughly reviewed. Debridement of mycotic and hypertrophic toenails, 1 through 5 bilateral and clearing of subungual debris. No ulceration, no infection noted.   Return Visit-Office Procedure: Patient instructed to return to the office for a follow up visit  10  weeks.for continued evaluation and treatment.    [name] [name] DPM

## 2018-07-22 ENCOUNTER — Other Ambulatory Visit: Payer: Self-pay | Admitting: Family Medicine

## 2018-07-22 DIAGNOSIS — R600 Localized edema: Secondary | ICD-10-CM

## 2018-07-22 MED FILL — HYDROCHLOROTHIAZIDE 25 MG T: 25 | 90 days supply | Qty: 90 | Fill #1

## 2018-07-22 MED FILL — ATORVASTATIN CALCIUM 40 MG: 40 | 30 days supply | Qty: 30 | Fill #0

## 2018-07-22 MED FILL — LOSARTAN POTASSIUM 100 MG T: 100 | 90 days supply | Qty: 90 | Fill #1

## 2018-07-22 MED FILL — METFORMIN HCL ER 500 MG TB2: 500 | 30 days supply | Qty: 120 | Fill #6

## 2018-07-22 MED FILL — FENOFIBRATE 145 MG TABS: 145 | 30 days supply | Qty: 30 | Fill #0

## 2018-08-18 ENCOUNTER — Other Ambulatory Visit: Payer: Self-pay

## 2018-08-18 ENCOUNTER — Emergency Department (HOSPITAL_COMMUNITY): Payer: Medicare Other

## 2018-08-18 ENCOUNTER — Encounter (HOSPITAL_COMMUNITY): Payer: Self-pay | Admitting: Emergency Medicine

## 2018-08-18 ENCOUNTER — Emergency Department (HOSPITAL_COMMUNITY)
Admission: EM | Admit: 2018-08-18 | Discharge: 2018-08-18 | Disposition: A | Discharge status: Home / Self Care | Payer: Medicare Other | Attending: Emergency Medicine | Admitting: Emergency Medicine

## 2018-08-18 DIAGNOSIS — W108XXA Fall (on) (from) other stairs and steps, initial encounter: Secondary | ICD-10-CM | POA: Insufficient documentation

## 2018-08-18 DIAGNOSIS — Z794 Long term (current) use of insulin: Secondary | ICD-10-CM | POA: Insufficient documentation

## 2018-08-18 DIAGNOSIS — M545 Low back pain: Secondary | ICD-10-CM | POA: Diagnosis not present

## 2018-08-18 DIAGNOSIS — Y999 Unspecified external cause status: Secondary | ICD-10-CM | POA: Diagnosis not present

## 2018-08-18 DIAGNOSIS — E119 Type 2 diabetes mellitus without complications: Secondary | ICD-10-CM | POA: Insufficient documentation

## 2018-08-18 DIAGNOSIS — M25552 Pain in left hip: Secondary | ICD-10-CM | POA: Diagnosis present

## 2018-08-18 DIAGNOSIS — Y92008 Other place in unspecified non-institutional (private) residence as the place of occurrence of the external cause: Secondary | ICD-10-CM | POA: Insufficient documentation

## 2018-08-18 DIAGNOSIS — Y9301 Activity, walking, marching and hiking: Secondary | ICD-10-CM | POA: Diagnosis not present

## 2018-08-18 DIAGNOSIS — I10 Essential (primary) hypertension: Secondary | ICD-10-CM | POA: Insufficient documentation

## 2018-08-18 DIAGNOSIS — Z79899 Other long term (current) drug therapy: Secondary | ICD-10-CM | POA: Insufficient documentation

## 2018-08-18 MED ORDER — KETOROLAC TROMETHAMINE 30 MG/ML IJ SOLN
60.0000 mg | Freq: Once | INTRAMUSCULAR | Status: AC
  Administered 2018-08-18: 60 mg via INTRAMUSCULAR
  Filled 2018-08-18: qty 2

## 2018-08-18 NOTE — ED Triage Notes (Signed)
Patient here from home with complaints of left hip pain after fall up stairs yesterday. Muscle relaxers with no relief. Increased with ambulation.

## 2018-08-18 NOTE — ED Provider Notes (Addendum)
TIME SEEN: 6:05 AM  CHIEF COMPLAINT: Fall, hip pain  HPI: Patient is a 58 year old male with history of bipolar disorder, hypertension, diabetes, hyperlipidemia, chronic pain on buprenorphine who presents to the emergency department after he missed a step in his house yesterday and fell onto his left hip.  No head injury or loss of consciousness.  Is having some lower back pain and left hip pain.  Is able to ambulate.  No numbness, tingling or focal weakness.  Not on blood thinners.  Patient has been able to ambulate.  ROS: See HPI Constitutional: no fever  Eyes: no drainage  ENT: no runny nose   Cardiovascular:  no chest pain  Resp: no SOB  GI: no vomiting GU: no dysuria Integumentary: no rash  Allergy: no hives  Musculoskeletal: no leg swelling  Neurological: no slurred speech ROS otherwise negative  PAST MEDICAL HISTORY/PAST SURGICAL HISTORY:  Past Medical History:  Diagnosis Date  . Anxiety   . Arthritis   . Blood transfusion without reported diagnosis   . Cataract   . Depressed bipolar disorder ([place])   . Depression   . Diabetes mellitus without complication ([place])   . Diabetic neuropathy ([place])   . GERD (gastroesophageal reflux disease)   . Herpes zoster 12/05/2008   Qualifier: Diagnosis of  By: Ronnald Ramp MD, Arvid Right.   . 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  Coupled with high doses of loperamide used for pain control.  Unintentional overdose -intractable VT leading to cardiogenic shock - ECMO  . Hyperlipidemia   . Hypertension   . Neuromuscular disorder ([place])    nerve damage back, neck, and shoulder  . Neuropathy in diabetes ([place])   . Sleep apnea    has CPAP but cannot use it  . Substance abuse ([place])    in past     MEDICATIONS:  Prior to Admission medications   Medication Sig Start Date End Date Taking? Authorizing Provider  atorvastatin (LIPITOR) 40 MG tablet TAKE 1 TABLET BY MOUTH DAILY AT 6 PM.  05/05/18   Ladell Pier, MD  Blood Pressure Monitor DEVI Use as directed to check home blood pressure 2-3 times a week 05/19/18   Ladell Pier, MD  buprenorphine (SUBUTEX) 8 MG SUBL SL tablet DISSOLVE 1 T UNDER THE TONGUE QID 08/30/17   [provider]  fenofibrate (TRICOR) 145 MG tablet TAKE 1 TABLET BY MOUTH DAILY. 05/05/18   Ladell Pier, MD  glucose blood test strip Use as instructed 05/24/14   [provider]  glucose monitoring kit (FREESTYLE) monitoring kit by Does not apply route. 05/24/14   [provider]  hydrochlorothiazide (HYDRODIURIL) 25 MG tablet Take 1 tablet (25 mg total) by mouth daily. 03/26/18   Ladell Pier, MD  insulin glargine (LANTUS) 100 UNIT/ML injection Inject 0.4 mLs (40 Units total) into the skin at bedtime. 05/26/17   Ladell Pier, MD  Lancets (FREESTYLE) lancets Use as instructed 05/24/14   [provider]  lansoprazole (PREVACID) 15 MG capsule Take 15 mg daily as needed by mouth (for heartburn).     [provider]  losartan (COZAAR) 100 MG tablet TAKE 1 TABLET (100 MG TOTAL) BY MOUTH DAILY. (NEEDS AND OFFICE VIST FOR FURTHER REFILLS) 03/26/18   Ladell Pier, MD  metFORMIN (GLUCOPHAGE XR) 500 MG 24 hr tablet Take 2 tablets (1,000 mg total) by mouth 2 (two) times daily. 07/24/17   Ladell Pier,  MD  NUCYNTA ER 50 MG 12 hr tablet TK 1 T PO Q 12 H 02/04/18   [provider]  potassium chloride (K-DUR) 10 MEQ tablet Take 1 tablet (10 mEq total) by mouth daily. With use of lasix/furosemide 03/19/18   Fulp, Cammie, MD  prazosin (MINIPRESS) 2 MG capsule TK ONE C PO  QHS 10/28/17   [provider]  pregabalin (LYRICA) 75 MG capsule TK 1 C PO QID 02/18/18   [provider]  QUEtiapine (SEROQUEL) 300 MG tablet Take 0.5 tablets (150 mg total) by mouth at bedtime. 12/24/16   Leonie Man, MD  sertraline (ZOLOFT) 100 MG tablet TK 1 T PO QAM 03/20/18   [provider]   sitaGLIPtin (JANUVIA) 50 MG tablet Take 1 tablet (50 mg total) by mouth daily. 03/26/18   Ladell Pier, MD  tiZANidine (ZANAFLEX) 4 MG tablet Take 4 mg by mouth 3 (three) times daily. 03/18/18   [provider]    ALLERGIES:  Allergies  Allergen Reactions  . Heparin Other (See Comments)    HIT Ab negative on 02/20/15, but SRA POSITIVE   . Oxytetracycline Rash and Other (See Comments)  . Sulfa Antibiotics Other (See Comments)  . Other     Mycins  . Sulfonamide Derivatives Rash    SOCIAL HISTORY:  Social History   Tobacco Use  . Smoking status: Never Smoker  . Smokeless tobacco: Never Used  Substance Use Topics  . Alcohol use: No    FAMILY HISTORY: Family History  Problem Relation Age of Onset  . Diabetes Mother   . Hyperlipidemia Mother   . Heart disease Father   . Colon cancer Neg Hx   . Colon polyps Neg Hx   . Esophageal cancer Neg Hx   . Rectal cancer Neg Hx   . Stomach cancer Neg Hx     EXAM: BP (!) 155/85 (BP Location: Right Arm)   Pulse (!) 56   Temp 98 F (36.7 C) (Oral)   Resp 20   SpO2 99%  CONSTITUTIONAL: Alert and oriented and responds appropriately to questions. Well-appearing; well-nourished; GCS 15 HEAD: Normocephalic; atraumatic EYES: Conjunctivae clear, PERRL, EOMI ENT: normal nose; no rhinorrhea; moist mucous membranes; pharynx without lesions noted; no dental injury; no septal hematoma NECK: Supple, no meningismus, no LAD; no midline spinal tenderness, step-off or deformity; trachea midline CARD: RRR; S1 and S2 appreciated; no murmurs, no clicks, no rubs, no gallops RESP: Normal chest excursion without splinting or tachypnea; breath sounds clear and equal bilaterally; no wheezes, no rhonchi, no rales; no hypoxia or respiratory distress CHEST:  chest wall stable, no crepitus or ecchymosis or deformity, nontender to palpation; no flail chest ABD/GI: Normal bowel sounds; non-distended; soft, non-tender, no rebound, no guarding; no  ecchymosis or other lesions noted PELVIS:  stable, nontender to palpation BACK:  The back appears normal and is minimally tender over the lower lumbar spine, there is no CVA tenderness; no midline spinal tenderness, step-off or deformity EXT: Tender to palpation over the left lateral hip without deformity or leg length discrepancy.  2+ DP pulses bilaterally.  Otherwise left lower extremity is nontender to palpation.  He has soft compartments diffusely.  No major deformity noted to his extremities. SKIN: Normal color for age and race; warm NEURO: Moves all extremities equally, normal sensation diffusely, normal speech, cranial nerves II through XII intact PSYCH: The patient's mood and manner are appropriate. Grooming and personal hygiene are appropriate.  MEDICAL DECISION MAKING: Patient here  with left hip pain, lower back pain after a fall.  Will give Toradol for pain.  He states he drove himself to the emergency department.  Will obtain x-rays.  No head injury.  No focal neurologic deficits.  ED PROGRESS: X-ray of the lumbar spine shows possible minimal T12 and L1 compression.  He has no tenderness in this area.  His tenderness is in the lower lumbar spine which appears normal on x-ray.  X-ray of the hip shows no acute abnormality.  Suspect contusion.  I feel he is safe to be discharged home and continue his pain medication as prescribed.   At this time, I do not feel there is any life-threatening condition present. I have reviewed and discussed all results (EKG, imaging, lab, urine as appropriate) and exam findings with patient/family. I have reviewed nursing notes and appropriate previous records.  I feel the patient is safe to be discharged home without further emergent workup and can continue workup as an outpatient as needed. Discussed usual and customary return precautions. Patient/family verbalize understanding and are comfortable with this plan.  Outpatient follow-up has been provided as  needed. All questions have been answered.      Joie Reamer, Delice Bison, DO 08/18/18 0815    Jeronimo Hellberg, Delice Bison, DO 08/18/18 [phone]

## 2018-08-23 ENCOUNTER — Emergency Department (HOSPITAL_COMMUNITY)
Admission: EM | Admit: 2018-08-23 | Discharge: 2018-08-23 | Disposition: A | Discharge status: Home / Self Care | Payer: Medicare Other | Attending: Emergency Medicine | Admitting: Emergency Medicine

## 2018-08-23 ENCOUNTER — Encounter (HOSPITAL_COMMUNITY): Payer: Self-pay

## 2018-08-23 ENCOUNTER — Other Ambulatory Visit: Payer: Self-pay

## 2018-08-23 DIAGNOSIS — Z794 Long term (current) use of insulin: Secondary | ICD-10-CM | POA: Diagnosis not present

## 2018-08-23 DIAGNOSIS — I10 Essential (primary) hypertension: Secondary | ICD-10-CM | POA: Diagnosis not present

## 2018-08-23 DIAGNOSIS — E119 Type 2 diabetes mellitus without complications: Secondary | ICD-10-CM | POA: Diagnosis not present

## 2018-08-23 DIAGNOSIS — M5432 Sciatica, left side: Secondary | ICD-10-CM | POA: Insufficient documentation

## 2018-08-23 DIAGNOSIS — Z79899 Other long term (current) drug therapy: Secondary | ICD-10-CM | POA: Diagnosis not present

## 2018-08-23 MED ORDER — DEXAMETHASONE SODIUM PHOSPHATE 10 MG/ML IJ SOLN
10.0000 mg | Freq: Once | INTRAMUSCULAR | Status: AC
  Administered 2018-08-23: 10 mg via INTRAMUSCULAR
  Filled 2018-08-23: qty 1

## 2018-08-23 MED ORDER — KETOROLAC TROMETHAMINE 30 MG/ML IJ SOLN
30.0000 mg | Freq: Once | INTRAMUSCULAR | Status: AC
  Administered 2018-08-23: 30 mg via INTRAMUSCULAR
  Filled 2018-08-23: qty 1

## 2018-08-23 NOTE — ED Triage Notes (Signed)
Pt reports pain in his lower back and L buttocks. Ambulatory. States that it feels like a knife. Hx of sciatica. He also states that he feels like he has to strain to urinate.

## 2018-08-23 NOTE — ED Notes (Signed)
Pt verbalized discharge instructions and follow up care. Alert and ambulatory. No IV.  

## 2018-08-23 NOTE — ED Provider Notes (Signed)
[name] [name]   CSN: [id number] Arrival date & time: 08/23/18  0435     History   Chief Complaint Chief Complaint  Patient presents with  . Back Pain    HPI [name] [name] is a 58 y.o. male.     HPI  59 year old male comes in a chief complaint of back pain. Patient has history of diabetes, sciatica and is on buprenorphine because of prior history of substance abuse.  He states that he had a fall few days ago and since then he has been having worsening of his sciatica pain.  He has history of chronic back issues from years ago.  He has been diagnosed with sciatica, and states that few days ago he had a fall leading to worsening of the pain.  His pain is primarily located in the left hip region and radiates down towards the left knee.  The pain is described as sharp pain. Pt has no associated numbness, weakness, urinary incontinence, urinary retention, bowel incontinence, pins and needle sensation in the perineal area.  Patient is taking buprenorphine, Zanaflex and anxiety medication at the moment without significant relief.   Past Medical History:  Diagnosis Date  . Anxiety   . Arthritis   . Blood transfusion without reported diagnosis   . Cataract   . Depressed bipolar disorder ([place])   . Depression   . Diabetes mellitus without complication ([place])   . Diabetic neuropathy ([place])   . GERD (gastroesophageal reflux disease)   . Herpes zoster 12/05/2008   Qualifier: Diagnosis of  By: Ronnald Ramp MD, Arvid Right.   . 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  Coupled with high doses of loperamide used for pain control.  Unintentional overdose -intractable VT leading to cardiogenic shock - ECMO  . Hyperlipidemia   . Hypertension   . Neuromuscular disorder ([place])    nerve damage back, neck, and shoulder  . Neuropathy in diabetes ([place])   . Sleep apnea    has CPAP but cannot  use it  . Substance abuse ([place])    in past     Patient Active Problem List   Diagnosis Date Noted  . Dry mouth 03/26/2018  . Sleep apnea   . Prolonged Q-T interval on ECG 12/03/2016  . Drug-induced torsades de pointes 12/03/2016  . 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 Mild concentric left ventricular hypertrophy (LVH) 09/11/2016  . DJD (degenerative joint disease) of cervical spine 02/26/2016  . Tinea pedis of both feet 06/03/2015  . Pre-ulcerative calluses 06/01/2015  . Type 2 diabetes mellitus with diabetic polyneuropathy ([place]) 06/09/2014  . Herpes zoster 12/05/2008  . Hyperlipidemia 05/11/2008  . DEPRESSION 05/11/2008  . Essential hypertension 05/11/2008  . GERD 05/11/2008  . OSTEOARTHRITIS 05/11/2008  . LOW BACK PAIN 05/11/2008  . COLONIC POLYPS, HX OF 05/11/2008    Past Surgical History:  Procedure Laterality Date  . COLONOSCOPY    . 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 INTRAOPERATIVE TRANSESOPHAGEAL ECHOCARDIOGRAM N/A 11/26/2016   Procedure: INTRAOPERATIVE TRANSESOPHAGEAL ECHOCARDIOGRAM;  Surgeon: Ivin Poot, MD;  Location: [facility];  Service: Open Heart Surgery;  Laterality: N/A;  . POLYPECTOMY    . SHOULDER ARTHROSCOPY WITH ROTATOR CUFF REPAIR Right 2009  . [place]  2010  . TRANSTHORACIC ECHOCARDIOGRAM  07/2016; 12/10/2016  a. Prior to VT arrest: normal. EF 55-60%. Gr 1 DD.  mild LVH. Mildly dilated Aortic Root.;; b.  Normal LV size and function.  Mild LVH.  EF 55%.  No RWMA.  No valve abnormalities.        Home Medications    Prior to Admission medications   Medication Sig Start Date End Date Taking? Authorizing Provider  atorvastatin (LIPITOR) 40 MG tablet TAKE 1 TABLET BY MOUTH DAILY AT 6 PM. 05/05/18   Ladell Pier, MD  Blood Pressure Monitor DEVI Use as directed to check home blood pressure  2-3 times a week 05/19/18   Ladell Pier, MD  buprenorphine (SUBUTEX) 8 MG SUBL SL tablet DISSOLVE 1 T UNDER THE TONGUE QID 08/30/17   [provider]  fenofibrate (TRICOR) 145 MG tablet TAKE 1 TABLET BY MOUTH DAILY. 05/05/18   Ladell Pier, MD  glucose blood test strip Use as instructed 05/24/14   [provider]  glucose monitoring kit (FREESTYLE) monitoring kit by Does not apply route. 05/24/14   [provider]  hydrochlorothiazide (HYDRODIURIL) 25 MG tablet Take 1 tablet (25 mg total) by mouth daily. 03/26/18   Ladell Pier, MD  insulin glargine (LANTUS) 100 UNIT/ML injection Inject 0.4 mLs (40 Units total) into the skin at bedtime. 05/26/17   Ladell Pier, MD  Lancets (FREESTYLE) lancets Use as instructed 05/24/14   [provider]  lansoprazole (PREVACID) 15 MG capsule Take 15 mg daily as needed by mouth (for heartburn).     [provider]  losartan (COZAAR) 100 MG tablet TAKE 1 TABLET (100 MG TOTAL) BY MOUTH DAILY. (NEEDS AND OFFICE VIST FOR FURTHER REFILLS) 03/26/18   Ladell Pier, MD  metFORMIN (GLUCOPHAGE XR) 500 MG 24 hr tablet Take 2 tablets (1,000 mg total) by mouth 2 (two) times daily. 07/24/17   Ladell Pier, MD  NUCYNTA ER 50 MG 12 hr tablet TK 1 T PO Q 12 H 02/04/18   [provider]  potassium chloride (K-DUR) 10 MEQ tablet Take 1 tablet (10 mEq total) by mouth daily. With use of lasix/furosemide 03/19/18   Fulp, Cammie, MD  prazosin (MINIPRESS) 2 MG capsule TK ONE C PO  QHS 10/28/17   [provider]  pregabalin (LYRICA) 75 MG capsule TK 1 C PO QID 02/18/18   [provider]  QUEtiapine (SEROQUEL) 300 MG tablet Take 0.5 tablets (150 mg total) by mouth at bedtime. 12/24/16   Leonie Man, MD  sertraline (ZOLOFT) 100 MG tablet TK 1 T PO QAM 03/20/18   [provider]  sitaGLIPtin (JANUVIA) 50 MG tablet Take 1 tablet (50 mg total) by mouth daily. 03/26/18   Ladell Pier, MD   tiZANidine (ZANAFLEX) 4 MG tablet Take 4 mg by mouth 3 (three) times daily. 03/18/18   [provider]    Family History Family History  Problem Relation Age of Onset  . Diabetes Mother   . Hyperlipidemia Mother   . Heart disease Father   . Colon cancer Neg Hx   . Colon polyps Neg Hx   . Esophageal cancer Neg Hx   . Rectal cancer Neg Hx   . Stomach cancer Neg Hx     Social History Social History   Tobacco Use  . Smoking status: Never Smoker  . Smokeless tobacco: Never Used  Substance Use Topics  . Alcohol use: No  . Drug use: No     Allergies   Heparin, Oxytetracycline,  Sulfa antibiotics, Other, and Sulfonamide derivatives   Review of Systems Review of Systems  Constitutional: Positive for activity change. Negative for fever.  Musculoskeletal: Positive for back pain.  Allergic/Immunologic: Negative for immunocompromised state.  Neurological: Negative for weakness and numbness.  Hematological: Does not bruise/bleed easily.     Physical Exam Updated Vital Signs BP (!) 153/101 (BP Location: Right Arm)   Pulse 74   Resp 18   Ht '6\' 2"'$  (1.88 m)   Wt 116.1 kg   SpO2 100%   BMI 32.87 kg/m   Physical Exam Vitals signs and nursing [name] reviewed.  Constitutional:      Appearance: He is well-developed.  HENT:     Head: Atraumatic.  Neck:     Musculoskeletal: Neck supple.  Cardiovascular:     Rate and Rhythm: Normal rate.  Pulmonary:     Effort: Pulmonary effort is normal.  Musculoskeletal:     Comments: Pt has tenderness over the left posterior hip and lower lumbar spine. No step offs, no erythema. Pt has 1+ patellar reflex over the left lower extremity, which is diminished compared to the right side Able to discriminate between sharp and dull. Able to ambulate Positive passive straight leg raise over the left lower extremity   Skin:    General: Skin is warm.  Neurological:     Mental Status: He is alert and oriented to person, place, and time.       ED Treatments / Results  Labs (all labs ordered are listed, but only abnormal results are displayed) Labs Reviewed - No data to display  EKG None  Radiology No results found.  Procedures Procedures (including critical care time)  Medications Ordered in ED Medications  ketorolac (TORADOL) 30 MG/ML injection 30 mg (30 mg Intramuscular Given 08/23/18 0623)  dexamethasone (DECADRON) injection 10 mg (10 mg Intramuscular Given 08/23/18 0626)     Initial Impression / Assessment and Plan / ED Course  I have reviewed the triage vital signs and the nursing notes.  Pertinent labs & imaging results that were available during my care of the patient were reviewed by me and considered in my medical decision making (see chart for details).        58 year old male with known history of degenerative spine disease and sciatica comes in a chief complaint of back pain that is radiating towards his knee.  He had a fall few days ago and x-ray of his hips were completed at that time, they were negative.  History is not suggestive of any red flags for cord compression. Patient's exam is overall reassuring as well.  No need for imaging at this time.  We will treat him with IM Decadron and Toradol.  I have advised him to add ibuprofen and Tylenol to his buprenorphine and Zanaflex.  Final Clinical Impressions(s) / ED Diagnoses   Final diagnoses:  Sciatica of left side    ED Discharge Orders    None       Varney Biles, MD 08/23/18 [phone]

## 2018-08-23 NOTE — Discharge Instructions (Addendum)
You are seen in the ER for back pain. It appears to [place] clinically that he has sciatica.  Please read the instructions on sciatica and related physical therapy that you can perform at home.  We recommend that you start taking 600 mg ibuprofen in addition to the other agents you are taking. We would also want you to follow-up with your PCP if the pain persist, as the next up would be for you to get formal physical therapy.

## 2018-08-27 ENCOUNTER — Ambulatory Visit (INDEPENDENT_AMBULATORY_CARE_PROVIDER_SITE_OTHER): Payer: Medicare Other | Admitting: Podiatry

## 2018-08-27 ENCOUNTER — Other Ambulatory Visit: Payer: Self-pay | Admitting: Internal Medicine

## 2018-08-27 ENCOUNTER — Encounter: Payer: Self-pay | Admitting: Podiatry

## 2018-08-27 ENCOUNTER — Other Ambulatory Visit: Payer: Self-pay

## 2018-08-27 VITALS — Temp 97.9°F

## 2018-08-27 DIAGNOSIS — E1142 Type 2 diabetes mellitus with diabetic polyneuropathy: Secondary | ICD-10-CM

## 2018-08-27 DIAGNOSIS — B351 Tinea unguium: Secondary | ICD-10-CM | POA: Diagnosis not present

## 2018-08-27 DIAGNOSIS — Z794 Long term (current) use of insulin: Secondary | ICD-10-CM

## 2018-08-27 DIAGNOSIS — M79675 Pain in left toe(s): Secondary | ICD-10-CM

## 2018-08-27 DIAGNOSIS — M79674 Pain in right toe(s): Secondary | ICD-10-CM | POA: Diagnosis not present

## 2018-08-27 MED FILL — METFORMIN HCL ER 500 MG TB2: 500 | 30 days supply | Qty: 120 | Fill #0

## 2018-08-27 NOTE — Progress Notes (Signed)
Complaint:  Visit Type: Patient returns to my office for continued preventative foot care services. Complaint: Patient states" his nails have grown thick and long are painful walking and wearing his shoes.  Patient is diabetic with neuropathy.  Patient presents for preventative foot care services..  Podiatric Exam: Vascular: dorsalis pedis and posterior tibial pulses are palpable bilateral. Capillary return is immediate. Temperature gradient is WNL. Skin turgor WNL  Sensorium: Diminished Semmes Weinstein monofilament test. Normal tactile sensation bilaterally. Nail Exam: Pt has thick disfigured discolored nails with subungual debris noted bilateral entire nail hallux through fifth toenails Ulcer Exam: There is no evidence of ulcer hallux  B/L. Orthopedic Exam: Muscle tone and strength are WNL. No limitations in general ROM. No crepitus or effusions noted. Foot type and digits show no abnormalities. Bony prominences are unremarkable. Mild swelling noted Skin: No Porokeratosis. No infection or ulcers.    Diagnosis:  Onychomycosis, , Pain in right toe, pain in left toes,   Treatment & Plan Procedures and Treatment: Consent by patient was obtained for treatment procedures. The patient understood the discussion of treatment and procedures well. All questions were answered thoroughly reviewed. Debridement of mycotic and hypertrophic toenails, 1 through 5 bilateral and clearing of subungual debris. No ulceration, no infection noted.   Return Visit-Office Procedure: Patient instructed to return to the office for a follow up visit  10  weeks.for continued evaluation and treatment.    Gardiner Barefoot DPM

## 2018-09-08 MED FILL — METFORMIN HCL ER 500 MG TB2: 500 | 30 days supply | Qty: 120 | Fill #0

## 2018-10-18 ENCOUNTER — Other Ambulatory Visit: Payer: Self-pay | Admitting: Internal Medicine

## 2018-10-18 DIAGNOSIS — Z794 Long term (current) use of insulin: Secondary | ICD-10-CM

## 2018-10-18 DIAGNOSIS — E1142 Type 2 diabetes mellitus with diabetic polyneuropathy: Secondary | ICD-10-CM

## 2018-10-18 MED FILL — HYDROCHLOROTHIAZIDE 25 MG T: 25 | 90 days supply | Qty: 90 | Fill #2

## 2018-10-18 MED FILL — FENOFIBRATE 145 MG TABLET: 145 | 30 days supply | Qty: 30 | Fill #1

## 2018-10-18 MED FILL — ATORVASTATIN CALCIUM 40 MG: 40 | 30 days supply | Qty: 30 | Fill #1

## 2018-10-18 MED FILL — LOSARTAN POTASSIUM 100 MG T: 100 | 30 days supply | Qty: 30 | Fill #2

## 2018-10-19 MED FILL — METFORMIN HCL ER 500 MG TB2: 500 | 30 days supply | Qty: 120 | Fill #0

## 2018-11-05 ENCOUNTER — Ambulatory Visit: Payer: Medicare Other | Admitting: Podiatry

## 2018-11-19 ENCOUNTER — Other Ambulatory Visit: Payer: Self-pay | Admitting: Internal Medicine

## 2018-11-19 DIAGNOSIS — E1142 Type 2 diabetes mellitus with diabetic polyneuropathy: Secondary | ICD-10-CM

## 2018-11-19 DIAGNOSIS — Z794 Long term (current) use of insulin: Secondary | ICD-10-CM

## 2018-11-22 ENCOUNTER — Other Ambulatory Visit: Payer: Self-pay | Admitting: Internal Medicine

## 2018-11-22 ENCOUNTER — Telehealth: Payer: Self-pay | Admitting: *Deleted

## 2018-11-22 DIAGNOSIS — E1142 Type 2 diabetes mellitus with diabetic polyneuropathy: Secondary | ICD-10-CM

## 2018-11-22 DIAGNOSIS — Z794 Long term (current) use of insulin: Secondary | ICD-10-CM

## 2018-11-22 MED FILL — METFORMIN HCL ER 500 MG TB2: 500 | 30 days supply | Qty: 120 | Fill #0

## 2018-11-22 NOTE — Telephone Encounter (Signed)
Metformin refills sent.

## 2018-11-22 NOTE — Telephone Encounter (Signed)
[name] [name] may you take care of this  

## 2018-12-08 ENCOUNTER — Ambulatory Visit: Payer: Medicare Other | Admitting: Podiatry

## 2018-12-23 ENCOUNTER — Ambulatory Visit: Payer: Medicare Other | Attending: Internal Medicine | Admitting: Internal Medicine

## 2018-12-23 ENCOUNTER — Other Ambulatory Visit: Payer: Self-pay

## 2018-12-23 ENCOUNTER — Encounter: Payer: Self-pay | Admitting: Internal Medicine

## 2018-12-23 DIAGNOSIS — E1142 Type 2 diabetes mellitus with diabetic polyneuropathy: Secondary | ICD-10-CM | POA: Diagnosis not present

## 2018-12-23 DIAGNOSIS — M545 Low back pain, unspecified: Secondary | ICD-10-CM

## 2018-12-23 DIAGNOSIS — E785 Hyperlipidemia, unspecified: Secondary | ICD-10-CM

## 2018-12-23 DIAGNOSIS — I10 Essential (primary) hypertension: Secondary | ICD-10-CM

## 2018-12-23 DIAGNOSIS — Z794 Long term (current) use of insulin: Secondary | ICD-10-CM

## 2018-12-23 DIAGNOSIS — G8929 Other chronic pain: Secondary | ICD-10-CM

## 2018-12-23 MED ORDER — METFORMIN HCL ER 500 MG PO TB24: 1000.0000 mg | ORAL_TABLET | Freq: Two times a day (BID) | ORAL | 6 refills | Status: DC
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
## 2018-12-24 MED FILL — METFORMIN HCL ER 500 MG TB2: 500 | 30 days supply | Qty: 120 | Fill #0

## 2018-12-27 ENCOUNTER — Ambulatory Visit: Payer: Medicare Other | Attending: Internal Medicine

## 2018-12-27 ENCOUNTER — Other Ambulatory Visit: Payer: Self-pay

## 2018-12-27 DIAGNOSIS — Z794 Long term (current) use of insulin: Secondary | ICD-10-CM

## 2018-12-27 DIAGNOSIS — E1142 Type 2 diabetes mellitus with diabetic polyneuropathy: Secondary | ICD-10-CM

## 2018-12-28 LAB — HEMOGLOBIN A1C
Est. average glucose Bld gHb Est-mCnc: 209 mg/dL
Hgb A1c MFr Bld: 8.9 % — ABNORMAL HIGH (ref 4.8–5.6)

## 2018-12-29 ENCOUNTER — Other Ambulatory Visit: Payer: Self-pay | Admitting: Internal Medicine

## 2018-12-29 ENCOUNTER — Telehealth: Payer: Self-pay | Admitting: Internal Medicine

## 2018-12-29 MED ORDER — INSULIN NPH (HUMAN) (ISOPHANE) 100 UNIT/ML ~~LOC~~ SUSP: 8.0000 [IU] | Freq: Every day | SUBCUTANEOUS | 11 refills | Status: DC

## 2018-12-29 MED ORDER — "SYRINGE/NEEDLE (DISP) 30G X 1/2"" 1 ML MISC": 5 refills | Status: DC

## 2018-12-29 MED FILL — SURE COMFORT 1 ML SYRINGE: 29G X 1/2" | 90 days supply | Qty: 100 | Fill #0

## 2018-12-29 MED FILL — HumuLIN N 100 UNIT/ML SUSP: 100 | 28 days supply | Qty: 10 | Fill #0

## 2018-12-29 NOTE — Telephone Encounter (Signed)
PC placed to pt today.  Left VMM letting him know A1CC is 8.9 which is not at goal.  Rec starting NPH insulin 8 units daily after dinner.  Will be in vile form and will have to draw up to inject.  Let me know if he needs teaching on how to do that so that we can arrange appt with clinical pharmacist.  Message also sent to his Mychart.

## 2018-12-30 ENCOUNTER — Encounter: Payer: Self-pay | Admitting: Pharmacist

## 2018-12-30 ENCOUNTER — Other Ambulatory Visit: Payer: Self-pay

## 2018-12-30 ENCOUNTER — Ambulatory Visit: Payer: Medicare Other | Attending: Internal Medicine | Admitting: Pharmacist

## 2018-12-30 VITALS — BP 132/80 | HR 80

## 2018-12-30 DIAGNOSIS — I10 Essential (primary) hypertension: Secondary | ICD-10-CM | POA: Diagnosis not present

## 2018-12-30 NOTE — Progress Notes (Signed)
   S:    PCP: Dr. Wynetta Emery  Patient arrives in good spirits.    Presents to the clinic for hypertension evaluation, counseling, and management.  Patient was referred and last seen by Primary Care Provider on 12/23/18 via televist. Dr. Wynetta Emery requested he come in for a BP check.     Patient reports adherence with medications.  Current BP Medications include:  HCTZ 25 mg daily, losartan 100 mg daily (propranolol listed but pt reports that this doesnt sound familiar)   Dietary habits include: limits salt; denies drinking excess caffeine  Exercise habits include: walks occasionally but limited d/t back pain Family / Social history:  - Fhx: DM, heart disease, HLD - Tobacco: never smoker - Alcohol: denies use   O:  Vitals:   12/30/18 1432  BP: 132/80  Pulse: 80    Home BP readings: checks occasionally at the drug store. Gives readings of 135-140/87-90  Last 3 Office BP readings: BP Readings from Last 3 Encounters:  12/30/18 132/80  08/23/18 (!) 153/101  08/18/18 (!) 166/82   BMET    Component Value Date/Time   NA 140 05/21/2018 1416   K 4.2 05/21/2018 1416   CL 98 05/21/2018 1416   CO2 26 05/21/2018 1416   GLUCOSE 187 (H) 05/21/2018 1416   GLUCOSE 220 (H) 11/26/2016 1303   BUN 19 05/21/2018 1416   CREATININE 1.13 05/21/2018 1416   CREATININE 1.17 02/05/2016 1419   CALCIUM 9.5 05/21/2018 1416   CALCIUM 8.1 (L) 03/06/2015 1138   GFRNONAA 72 05/21/2018 1416   GFRNONAA 70 02/05/2016 1419   GFRAA 84 05/21/2018 1416   GFRAA 81 02/05/2016 1419   Renal function: CrCl cannot be calculated (Patient's most recent lab result is older than the maximum 21 days allowed.).  Clinical ASCVD: No  The ASCVD Risk score Mikey Bussing DC Jr., et al., 2013) failed to calculate for the following reasons:   The valid total cholesterol range is 130 to 320 mg/dL  A/P: Hypertension longstanding currently controlled on current medications. BP Goal = <130/80 mmHg. Patient is adherent to medication. He  does not appear to be taking propranolol. This is listed as historical in [facility]. Will have him continue HCTZ and losartan for now.  -Continued current regimen.  -Counseled on lifestyle modifications for blood pressure control including reduced dietary sodium, increased exercise, adequate sleep  Results reviewed and written information provided. Total time in face-to-face counseling 15 minutes.   F/U Clinic Visit w/ PCP.  Benard Halsted, PharmD, [place] [phone]

## 2018-12-30 NOTE — Patient Instructions (Signed)
Thank you for coming to see [place] today.   Blood pressure today is well-controlled.   Continue taking blood pressure medications as prescribed.   Limiting salt and caffeine, as well as exercising as able for at least 30 minutes for 5 days out of the week, can also help you lower your blood pressure.  Take your blood pressure at home if you are able. Please write down these numbers and bring them to your visits.  If you have any questions about medications, please call me [phone].  Luke  From Dr. Wynetta Emery:  Your A1C is 8.9 with goal being less than 7. I think the most cost effective way to go is to have you start taking a once daily dose of an insulin called NPH. It comes in a vile and you will have to draw it up in a syringe. I want you to start taking 8 units after dinner every day. I will send the prescription to your pharmacy. If you need teaching on how to draw up insulin from a vile, please let me know so that I can have my clinical pharmacist meet with you to do teaching.

## 2019-01-16 DIAGNOSIS — M792 Neuralgia and neuritis, unspecified: Secondary | ICD-10-CM | POA: Diagnosis not present

## 2019-01-20 DIAGNOSIS — M7918 Myalgia, other site: Secondary | ICD-10-CM | POA: Diagnosis not present

## 2019-01-23 DIAGNOSIS — G894 Chronic pain syndrome: Secondary | ICD-10-CM | POA: Diagnosis not present

## 2019-01-26 ENCOUNTER — Other Ambulatory Visit: Payer: Self-pay | Admitting: Family Medicine

## 2019-01-26 DIAGNOSIS — R600 Localized edema: Secondary | ICD-10-CM

## 2019-01-28 ENCOUNTER — Ambulatory Visit: Payer: Medicare Other | Admitting: Podiatry

## 2019-01-30 ENCOUNTER — Encounter (HOSPITAL_COMMUNITY): Payer: Self-pay | Admitting: Emergency Medicine

## 2019-01-30 ENCOUNTER — Other Ambulatory Visit: Payer: Self-pay

## 2019-01-30 DIAGNOSIS — I10 Essential (primary) hypertension: Secondary | ICD-10-CM | POA: Insufficient documentation

## 2019-01-30 DIAGNOSIS — Z794 Long term (current) use of insulin: Secondary | ICD-10-CM | POA: Insufficient documentation

## 2019-01-30 DIAGNOSIS — Z79899 Other long term (current) drug therapy: Secondary | ICD-10-CM | POA: Diagnosis not present

## 2019-01-30 DIAGNOSIS — E1140 Type 2 diabetes mellitus with diabetic neuropathy, unspecified: Secondary | ICD-10-CM | POA: Insufficient documentation

## 2019-01-30 DIAGNOSIS — E1142 Type 2 diabetes mellitus with diabetic polyneuropathy: Secondary | ICD-10-CM | POA: Diagnosis not present

## 2019-01-30 DIAGNOSIS — M7989 Other specified soft tissue disorders: Secondary | ICD-10-CM | POA: Diagnosis not present

## 2019-01-30 DIAGNOSIS — R2243 Localized swelling, mass and lump, lower limb, bilateral: Secondary | ICD-10-CM | POA: Diagnosis not present

## 2019-01-30 NOTE — ED Triage Notes (Signed)
Patient is complaining of reddened and swollen legs. Right leg is hot to touch. Right leg is swollen more than left.

## 2019-01-31 ENCOUNTER — Ambulatory Visit (HOSPITAL_BASED_OUTPATIENT_CLINIC_OR_DEPARTMENT_OTHER)
Admission: RE | Admit: 2019-01-31 | Discharge: 2019-01-31 | Disposition: A | Discharge status: Home / Self Care | Payer: Medicare Other | Source: Ambulatory Visit | Attending: Emergency Medicine | Admitting: Emergency Medicine

## 2019-01-31 ENCOUNTER — Emergency Department (HOSPITAL_COMMUNITY)
Admission: EM | Admit: 2019-01-31 | Discharge: 2019-01-31 | Disposition: A | Discharge status: Home / Self Care | Payer: Medicare Other | Attending: Emergency Medicine | Admitting: Emergency Medicine

## 2019-01-31 DIAGNOSIS — M7989 Other specified soft tissue disorders: Secondary | ICD-10-CM | POA: Diagnosis not present

## 2019-01-31 MED ORDER — FUROSEMIDE 40 MG PO TABS
40.0000 mg | ORAL_TABLET | Freq: Once | ORAL | Status: AC
  Administered 2019-01-31: 40 mg via ORAL
  Filled 2019-01-31: qty 1

## 2019-01-31 MED ORDER — FUROSEMIDE 20 MG PO TABS: 20.0000 mg | ORAL_TABLET | Freq: Every day | ORAL | 0 refills | Status: DC

## 2019-01-31 MED FILL — FUROSEMIDE 20 MG TABS: 20 | 5 days supply | Qty: 5 | Fill #0

## 2019-01-31 NOTE — ED Provider Notes (Signed)
[name] [name] DEPT Provider Note   CSN: [id number] Arrival date & time: 01/30/19  2221     History Chief Complaint  Patient presents with  . Leg Swelling    [name] [name] is a 59 y.o. male.  59 yo M with a chief complaints of bilateral leg edema.  Going on for the past week.  Patient noticed that he has been falling asleep while watching TV in his recliner.  Eventually wakes up and then goes to the bed.  Since then his legs been more swollen.  Right worse than left.  Having some redness to them and he thought that his right leg was warm to the touch and so came to the ED for evaluation.  He denies chest pain or shortness of breath.  Denies orthopnea or PND.  The history is provided by the patient.  Illness Severity:  Moderate Onset quality:  Gradual Duration:  1 week Timing:  Constant Progression:  Worsening Chronicity:  New Associated symptoms: no abdominal pain, no chest pain, no congestion, no diarrhea, no fever, no headaches, no myalgias, no rash, no shortness of breath and no vomiting        Past Medical History:  Diagnosis Date  . Anxiety   . Arthritis   . Blood transfusion without reported diagnosis   . Cataract   . Depressed bipolar disorder ([place])   . Depression   . Diabetes mellitus without complication ([place])   . Diabetic neuropathy ([place])   . GERD (gastroesophageal reflux disease)   . Herpes zoster 12/05/2008   Qualifier: Diagnosis of  By: Ronnald Ramp MD, Arvid Right.   . 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  Coupled with high doses of loperamide used for pain control.  Unintentional overdose -intractable VT leading to cardiogenic shock - ECMO  . Hyperlipidemia   . Hypertension   . Neuromuscular disorder ([place])    nerve damage back, neck, and shoulder  . Neuropathy in diabetes ([place])   . Sleep apnea    has CPAP but cannot use it  . Substance abuse ([place])    in past      Patient Active Problem List   Diagnosis Date Noted  . Dry mouth 03/26/2018  . Sleep apnea   . Prolonged Q-T interval on ECG 12/03/2016  . Drug-induced torsades de pointes 12/03/2016  . 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 Mild concentric left ventricular hypertrophy (LVH) 09/11/2016  . DJD (degenerative joint disease) of cervical spine 02/26/2016  . Tinea pedis of both feet 06/03/2015  . Pre-ulcerative calluses 06/01/2015  . Type 2 diabetes mellitus with diabetic polyneuropathy ([place]) 06/09/2014  . Herpes zoster 12/05/2008  . Hyperlipidemia 05/11/2008  . DEPRESSION 05/11/2008  . Essential hypertension 05/11/2008  . GERD 05/11/2008  . OSTEOARTHRITIS 05/11/2008  . LOW BACK PAIN 05/11/2008  . COLONIC POLYPS, HX OF 05/11/2008    Past Surgical History:  Procedure Laterality Date  . COLONOSCOPY    . 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 INTRAOPERATIVE TRANSESOPHAGEAL ECHOCARDIOGRAM N/A 11/26/2016   Procedure: INTRAOPERATIVE TRANSESOPHAGEAL ECHOCARDIOGRAM;  Surgeon: Ivin Poot, MD;  Location: [place];  Service: Open Heart Surgery;  Laterality: N/A;  . POLYPECTOMY    . SHOULDER ARTHROSCOPY WITH ROTATOR CUFF REPAIR Right 2009  . [place]  2010  . TRANSTHORACIC ECHOCARDIOGRAM  07/2016; 12/10/2016   a. Prior to VT arrest: normal. EF 55-60%. Gr 1 DD.  mild LVH. Mildly dilated Aortic Root.;; b.  Normal LV size and function.  Mild LVH.  EF 55%.  No RWMA.  No valve abnormalities.       Family History  Problem Relation Age of Onset  . Diabetes Mother   . Hyperlipidemia Mother   . Heart disease Father   . Colon cancer Neg Hx   . Colon polyps Neg Hx   . Esophageal cancer Neg Hx   . Rectal cancer Neg Hx   . Stomach cancer Neg Hx     Social History   Tobacco Use  . Smoking status: Never Smoker  . Smokeless tobacco: Never  Used  Substance Use Topics  . Alcohol use: No  . Drug use: No    Home Medications Prior to Admission medications   Medication Sig Start Date End Date Taking? Authorizing Provider  atorvastatin (LIPITOR) 40 MG tablet TAKE 1 TABLET BY MOUTH DAILY AT 6 PM. 05/05/18   Ladell Pier, MD  Blood Pressure Monitor DEVI Use as directed to check home blood pressure 2-3 times a week 05/19/18   Ladell Pier, MD  buprenorphine (SUBUTEX) 8 MG SUBL SL tablet DISSOLVE 1 T UNDER THE TONGUE QID 08/30/17   [provider]  carisoprodol (SOMA) 350 MG tablet  08/23/18   [provider]  cloNIDine (CATAPRES) 0.1 MG tablet TK 1 T PO TID 08/09/18   [provider]  cyclobenzaprine (FLEXERIL) 10 MG tablet TK 1 T PO QHS 07/25/18   [provider]  fenofibrate (TRICOR) 145 MG tablet TAKE 1 TABLET BY MOUTH DAILY. 05/05/18   Ladell Pier, MD  furosemide (LASIX) 20 MG tablet Take 1 tablet (20 mg total) by mouth daily. 01/31/19   Deno Etienne, DO  glucose blood test strip Use as instructed 05/24/14   [provider]  glucose monitoring kit (FREESTYLE) monitoring kit by Does not apply route. 05/24/14   [provider]  hydrochlorothiazide (HYDRODIURIL) 25 MG tablet Take 1 tablet (25 mg total) by mouth daily. 03/26/18   Ladell Pier, MD  ibuprofen (ADVIL) 800 MG tablet  08/24/18   [provider]  insulin NPH Human (HUMULIN N) 100 UNIT/ML injection Inject 0.08 mLs (8 Units total) into the skin at bedtime. 12/29/18   Ladell Pier, MD  Lancets (FREESTYLE) lancets Use as instructed 05/24/14   [provider]  lansoprazole (PREVACID) 15 MG capsule Take 15 mg daily as needed by mouth (for heartburn).     [provider]  losartan (COZAAR) 100 MG tablet TAKE 1 TABLET (100 MG TOTAL) BY MOUTH DAILY. (NEEDS AND OFFICE VIST FOR FURTHER REFILLS) 03/26/18   Ladell Pier, MD  metFORMIN (GLUCOPHAGE-XR) 500 MG 24 hr tablet Take 2 tablets  (1,000 mg total) by mouth 2 (two) times daily. 12/23/18   Ladell Pier, MD  NUCYNTA 100 MG TABS TAKE ONE TABLET BY MOUTH EVERY DAY FOR 3 DAYS, THEN TAKE ONE TABLET TWICE DAILY FOR 3 DAYS, THEN TAKE ONE TABLET FOUR TIMES DAILY 06/25/18   [provider]  omeprazole (PRILOSEC) 40 MG capsule TK 1 C QD UTD 07/25/18   [provider]  potassium chloride (K-DUR) 10 MEQ tablet Take 1 tablet (10 mEq total) by mouth daily. With use of lasix/furosemide 03/19/18   Fulp, Cammie, MD  prazosin (MINIPRESS) 2 MG capsule TK ONE C PO  QHS 10/28/17   [provider]  pregabalin (LYRICA) 300 MG capsule TK 1 C PO BID 08/09/18   [provider]  pregabalin (LYRICA) 75 MG capsule TK 1 C PO QID 02/18/18   [provider]  propranolol (INDERAL) 10 MG tablet TK 1 T PO TID 08/09/18   [provider]  QUEtiapine (SEROQUEL) 300 MG tablet Take 0.5 tablets (150 mg total) by mouth at bedtime. 12/24/16   Leonie Man, MD  sertraline (ZOLOFT) 100 MG tablet TK 1 T PO QAM 03/20/18   [provider]  Syringe/Needle, Disp, 30G X 1/2" 1 ML MISC UAD - use as directed 12/29/18   Ladell Pier, MD  tiZANidine (ZANAFLEX) 4 MG tablet Take 4 mg by mouth 3 (three) times daily. 03/18/18   [provider]  traMADol (ULTRAM) 50 MG tablet TK 2 TS PO QID 07/19/18   [provider]    Allergies    Heparin, Oxytetracycline, Sulfa antibiotics, Other, and Sulfonamide derivatives  Review of Systems   Review of Systems  Constitutional: Negative for chills and fever.  HENT: Negative for congestion and facial swelling.   Eyes: Negative for discharge and visual disturbance.  Respiratory: Negative for shortness of breath.   Cardiovascular: Positive for leg swelling. Negative for chest pain and palpitations.  Gastrointestinal: Negative for abdominal pain, diarrhea and vomiting.  Musculoskeletal: Negative for arthralgias and myalgias.  Skin: Negative for color change and  rash.  Neurological: Negative for tremors, syncope and headaches.  Psychiatric/Behavioral: Negative for confusion and dysphoric mood.    Physical Exam Updated Vital Signs BP 124/81 (BP Location: Right Arm)   Pulse 60   Temp 98.1 F (36.7 C) (Oral)   Resp 18   Ht '6\' 2"'$  (1.88 m)   Wt 111.6 kg   SpO2 94%   BMI 31.58 kg/m   Physical Exam Vitals and nursing note reviewed.  Constitutional:      Appearance: He is well-developed.  HENT:     Head: Normocephalic and atraumatic.  Eyes:     Pupils: Pupils are equal, round, and reactive to light.  Neck:     Vascular: No JVD.  Cardiovascular:     Rate and Rhythm: Normal rate and regular rhythm.     Heart sounds: No murmur. No friction rub. No gallop.   Pulmonary:     Effort: No respiratory distress.     Breath sounds: No wheezing.  Abdominal:     General: There is no distension.     Tenderness: There is no guarding or rebound.  Musculoskeletal:        General: Normal range of motion.     Cervical back: Normal range of motion and neck supple.     Right lower leg: Edema present.     Left lower leg: Edema present.     Comments: Bilateral lower extremity edema and erythema worse on the right than the left.  No significant warmth with 1 compared to the other.  Pulse motor and sensation are intact distally.  Skin:    Coloration: Skin is not pale.     Findings: No rash.  Neurological:     Mental Status: He is alert and oriented to person, place, and time.  Psychiatric:        Behavior: Behavior normal.     ED Results / Procedures / Treatments   Labs (all labs ordered are listed, but only abnormal results are displayed) Labs Reviewed - No data to display  EKG EKG Interpretation  Date/Time:  Monday January 31 2019 02:07:29 EST Ventricular Rate:  62 PR Interval:    QRS Duration: 103 QT Interval:  437 QTC Calculation: 444 R Axis:   23 Text Interpretation: Sinus rhythm Borderline prolonged PR interval Baseline wander in  lead(s) V1 QT has shortened No significant change since last tracing Confirmed by Deno Etienne [phone]) on 01/31/2019 3:00:40 AM   Radiology No results found.  Procedures Procedures (including critical care time)  Medications Ordered in ED Medications  furosemide (LASIX) tablet 40 mg (40 mg Oral Given 01/31/19 0215)    ED Course  I have reviewed the triage vital signs and the nursing notes.  Pertinent labs & imaging results that were available during my care of the patient were reviewed by me and considered in my medical decision making (see chart for details).    MDM Rules/Calculators/A&P                      59 yo M with a chief complaints of bilateral lower extremity edema.  History and physical is most consistent with dependent edema as the patient is providing history where he has been falling asleep in the sitting position.  He has no shortness of breath or chest discomfort.  Was chronically on Lasix but has not been able to get a refill from his family provider.  We will give a dose of Lasix here.  Since the right lower extremity is larger than the left and the patient does not recall if this is chronic or not we will obtain a DVT study.  Unfortunately vascular ultrasound is not available at this time, will place an order for him to return tomorrow.  Feel DVT is less likely and so we will not start him on anticoagulation.  Feel cellulitis is less likely without significant tenderness and seems similar to the other side as far as erythema.  3:03 AM:  I have discussed the diagnosis/risks/treatment options with the patient and believe the pt to be eligible for discharge home to follow-up with PCP. We also discussed returning to the ED immediately if new or worsening sx occur. We discussed the sx which are most concerning (e.g., chest pain, sob, fever, inability to tolerate by mouth, syncope or near syncope) that necessitate immediate return. Medications administered to the patient during  their visit and any new prescriptions provided to the patient are listed below.  Medications given during this visit Medications  furosemide (LASIX) tablet 40 mg (40 mg Oral Given 01/31/19 0215)     The patient appears reasonably screen and/or stabilized for discharge and I doubt any other medical condition or other [facility] requiring further screening, evaluation, or treatment in the ED at this time prior to discharge.   Final Clinical Impression(s) / ED Diagnoses Final diagnoses:  Leg swelling    Rx / DC Orders ED Discharge Orders         Ordered    LE VENOUS     01/31/19 0217    furosemide (LASIX) 20 MG tablet  Daily     01/31/19 0219           Deno Etienne, DO 01/31/19 0303

## 2019-01-31 NOTE — Progress Notes (Signed)
Right lower extremity venous duplex completed. Refer to "CV Proc" under chart review to view preliminary results.  01/31/2019 11:55 AM Kelby Aline., MHA, RVT, RDCS, RDMS

## 2019-01-31 NOTE — Discharge Instructions (Addendum)
Try to keep your legs elevated.  Return for shortness of breath, chest pain, feeling like you may pass out.

## 2019-02-03 DIAGNOSIS — M7918 Myalgia, other site: Secondary | ICD-10-CM | POA: Diagnosis not present

## 2019-02-03 DIAGNOSIS — M545 Low back pain: Secondary | ICD-10-CM | POA: Diagnosis not present

## 2019-02-03 MED FILL — metFORMIN HCL ER 500 MG TB2: 500 | 30 days supply | Qty: 120 | Fill #1

## 2019-02-10 DIAGNOSIS — M545 Low back pain: Secondary | ICD-10-CM | POA: Diagnosis not present

## 2019-02-13 DIAGNOSIS — M545 Low back pain: Secondary | ICD-10-CM | POA: Diagnosis not present

## 2019-02-17 ENCOUNTER — Ambulatory Visit: Payer: Medicare Other | Admitting: Internal Medicine

## 2019-02-20 DIAGNOSIS — M545 Low back pain: Secondary | ICD-10-CM | POA: Diagnosis not present

## 2019-02-24 DIAGNOSIS — E119 Type 2 diabetes mellitus without complications: Secondary | ICD-10-CM | POA: Diagnosis not present

## 2019-02-27 DIAGNOSIS — G894 Chronic pain syndrome: Secondary | ICD-10-CM | POA: Diagnosis not present

## 2019-03-03 DIAGNOSIS — M792 Neuralgia and neuritis, unspecified: Secondary | ICD-10-CM | POA: Diagnosis not present

## 2019-03-03 DIAGNOSIS — E119 Type 2 diabetes mellitus without complications: Secondary | ICD-10-CM | POA: Diagnosis not present

## 2019-03-04 ENCOUNTER — Ambulatory Visit (INDEPENDENT_AMBULATORY_CARE_PROVIDER_SITE_OTHER): Payer: Medicare Other | Admitting: Podiatry

## 2019-03-04 ENCOUNTER — Other Ambulatory Visit: Payer: Self-pay

## 2019-03-04 ENCOUNTER — Encounter: Payer: Self-pay | Admitting: Podiatry

## 2019-03-04 DIAGNOSIS — E1142 Type 2 diabetes mellitus with diabetic polyneuropathy: Secondary | ICD-10-CM

## 2019-03-04 DIAGNOSIS — B351 Tinea unguium: Secondary | ICD-10-CM

## 2019-03-04 DIAGNOSIS — M79674 Pain in right toe(s): Secondary | ICD-10-CM | POA: Diagnosis not present

## 2019-03-04 DIAGNOSIS — M79675 Pain in left toe(s): Secondary | ICD-10-CM

## 2019-03-04 NOTE — Progress Notes (Signed)
Complaint:  Visit Type: Patient returns to my office for continued preventative foot care services. Complaint: Patient states" his nails have grown thick and long are painful walking and wearing his shoes.  Patient is diabetic with neuropathy.  Patient presents for preventative foot care services..  Podiatric Exam: Vascular: dorsalis pedis and posterior tibial pulses are palpable bilateral. Capillary return is immediate. Temperature gradient is WNL. Skin turgor WNL  Sensorium: Diminished Semmes Weinstein monofilament test. Normal tactile sensation bilaterally. Nail Exam: Pt has thick disfigured discolored nails with subungual debris noted bilateral entire nail hallux through fifth toenails Ulcer Exam: There is no evidence of ulcer hallux  B/L. Orthopedic Exam: Muscle tone and strength are WNL. No limitations in general ROM. No crepitus or effusions noted. Foot type and digits show no abnormalities. Bony prominences are unremarkable. Mild swelling noted.  HAV right foot. Skin: No Porokeratosis. No infection or ulcers.  Pinch callus right hallux.  Diagnosis:  Onychomycosis, , Pain in right toe, pain in left toes,   Treatment & Plan Procedures and Treatment: Consent by patient was obtained for treatment procedures. The patient understood the discussion of treatment and procedures well. All questions were answered thoroughly reviewed. Debridement of mycotic and hypertrophic toenails, 1 through 5 bilateral and clearing of subungual debris. No ulceration, no infection noted.   Return Visit-Office Procedure: Patient instructed to return to the office for a follow up visit  10  weeks.for continued evaluation and treatment.    Gardiner Barefoot DPM

## 2019-03-06 DIAGNOSIS — G894 Chronic pain syndrome: Secondary | ICD-10-CM | POA: Diagnosis not present

## 2019-03-07 MED FILL — metFORMIN HCL ER 500 MG TB2: 500 | 30 days supply | Qty: 120 | Fill #2

## 2019-03-10 DIAGNOSIS — M792 Neuralgia and neuritis, unspecified: Secondary | ICD-10-CM | POA: Diagnosis not present

## 2019-03-10 DIAGNOSIS — E119 Type 2 diabetes mellitus without complications: Secondary | ICD-10-CM | POA: Diagnosis not present

## 2019-03-11 ENCOUNTER — Ambulatory Visit: Payer: Medicare Other | Admitting: Family

## 2019-03-11 DIAGNOSIS — M9903 Segmental and somatic dysfunction of lumbar region: Secondary | ICD-10-CM | POA: Diagnosis not present

## 2019-03-11 DIAGNOSIS — M5416 Radiculopathy, lumbar region: Secondary | ICD-10-CM | POA: Diagnosis not present

## 2019-03-13 DIAGNOSIS — M7918 Myalgia, other site: Secondary | ICD-10-CM | POA: Diagnosis not present

## 2019-03-17 DIAGNOSIS — M545 Low back pain: Secondary | ICD-10-CM | POA: Diagnosis not present

## 2019-03-17 DIAGNOSIS — E119 Type 2 diabetes mellitus without complications: Secondary | ICD-10-CM | POA: Diagnosis not present

## 2019-03-24 DIAGNOSIS — M5416 Radiculopathy, lumbar region: Secondary | ICD-10-CM | POA: Diagnosis not present

## 2019-03-24 DIAGNOSIS — M9903 Segmental and somatic dysfunction of lumbar region: Secondary | ICD-10-CM | POA: Diagnosis not present

## 2019-03-27 DIAGNOSIS — M792 Neuralgia and neuritis, unspecified: Secondary | ICD-10-CM | POA: Diagnosis not present

## 2019-03-27 DIAGNOSIS — M545 Low back pain: Secondary | ICD-10-CM | POA: Diagnosis not present

## 2019-03-28 ENCOUNTER — Other Ambulatory Visit: Payer: Self-pay | Admitting: Internal Medicine

## 2019-03-28 DIAGNOSIS — I10 Essential (primary) hypertension: Secondary | ICD-10-CM

## 2019-03-28 MED FILL — HumuLIN N 100 UNIT/ML SUSP: 100 | 28 days supply | Qty: 10 | Fill #0

## 2019-03-31 DIAGNOSIS — G894 Chronic pain syndrome: Secondary | ICD-10-CM | POA: Diagnosis not present

## 2019-03-31 DIAGNOSIS — M5416 Radiculopathy, lumbar region: Secondary | ICD-10-CM | POA: Diagnosis not present

## 2019-03-31 DIAGNOSIS — M9903 Segmental and somatic dysfunction of lumbar region: Secondary | ICD-10-CM | POA: Diagnosis not present

## 2019-04-01 ENCOUNTER — Other Ambulatory Visit: Payer: Self-pay

## 2019-04-01 ENCOUNTER — Ambulatory Visit: Payer: Medicare Other | Attending: Family | Admitting: Family

## 2019-04-01 ENCOUNTER — Encounter: Payer: Self-pay | Admitting: Family

## 2019-04-01 VITALS — BP 162/83 | HR 53 | Temp 97.3°F | Resp 16 | Wt 251.8 lb

## 2019-04-01 DIAGNOSIS — Z794 Long term (current) use of insulin: Secondary | ICD-10-CM

## 2019-04-01 DIAGNOSIS — I10 Essential (primary) hypertension: Secondary | ICD-10-CM | POA: Diagnosis not present

## 2019-04-01 DIAGNOSIS — E1142 Type 2 diabetes mellitus with diabetic polyneuropathy: Secondary | ICD-10-CM

## 2019-04-01 DIAGNOSIS — R600 Localized edema: Secondary | ICD-10-CM | POA: Diagnosis not present

## 2019-04-01 DIAGNOSIS — E785 Hyperlipidemia, unspecified: Secondary | ICD-10-CM

## 2019-04-01 DIAGNOSIS — R609 Edema, unspecified: Secondary | ICD-10-CM

## 2019-04-01 MED ORDER — FUROSEMIDE 20 MG PO TABS: 20.0000 mg | ORAL_TABLET | Freq: Every day | ORAL | 0 refills | Status: DC

## 2019-04-01 MED FILL — FUROSEMIDE 20 MG TABS: 20 | 7 days supply | Qty: 7 | Fill #0

## 2019-04-01 NOTE — Patient Instructions (Signed)
Follow-up with primary doctor in 3 weeks. Labs collected today. Wear compression stockings to help with leg swelling. Hypertension, Adult Hypertension is another name for high blood pressure. High blood pressure forces your heart to work harder to pump blood. This can cause problems over time. There are two numbers in a blood pressure reading. There is a top number (systolic) over a bottom number (diastolic). It is best to have a blood pressure that is below 120/80. Healthy choices can help lower your blood pressure, or you may need medicine to help lower it. What are the causes? The cause of this condition is not known. Some conditions may be related to high blood pressure. What increases the risk?  Smoking.  Having type 2 diabetes mellitus, high cholesterol, or both.  Not getting enough exercise or physical activity.  Being overweight.  Having too much fat, sugar, calories, or salt (sodium) in your diet.  Drinking too much alcohol.  Having long-term (chronic) kidney disease.  Having a family history of high blood pressure.  Age. Risk increases with age.  Race. You may be at higher risk if you are African American.  Gender. Men are at higher risk than women before age 42. After age 35, women are at higher risk than men.  Having obstructive sleep apnea.  Stress. What are the signs or symptoms?  High blood pressure may not cause symptoms. Very high blood pressure (hypertensive crisis) may cause: ? Headache. ? Feelings of worry or nervousness (anxiety). ? Shortness of breath. ? Nosebleed. ? A feeling of being sick to your stomach (nausea). ? Throwing up (vomiting). ? Changes in how you see. ? Very bad chest pain. ? Seizures. How is this treated?  This condition is treated by making healthy lifestyle changes, such as: ? Eating healthy foods. ? Exercising more. ? Drinking less alcohol.  Your health care provider may prescribe medicine if lifestyle changes are not  enough to get your blood pressure under control, and if: ? Your top number is above 130. ? Your bottom number is above 80.  Your personal target blood pressure may vary. Follow these instructions at home: Eating and drinking   If told, follow the DASH eating plan. To follow this plan: ? Fill one half of your plate at each meal with fruits and vegetables. ? Fill one fourth of your plate at each meal with whole grains. Whole grains include whole-wheat pasta, brown rice, and whole-grain bread. ? Eat or drink low-fat dairy products, such as skim milk or low-fat yogurt. ? Fill one fourth of your plate at each meal with low-fat (lean) proteins. Low-fat proteins include fish, chicken without skin, eggs, beans, and tofu. ? Avoid fatty meat, cured and processed meat, or chicken with skin. ? Avoid pre-made or processed food.  Eat less than 1,500 mg of salt each day.  Do not drink alcohol if: ? Your doctor tells you not to drink. ? You are pregnant, may be pregnant, or are planning to become pregnant.  If you drink alcohol: ? Limit how much you use to:  0-1 drink a day for women.  0-2 drinks a day for men. ? Be aware of how much alcohol is in your drink. In the [place] one drink equals one 12 oz bottle of beer (355 mL), one 5 oz glass of wine (148 mL), or one 1 oz glass of hard liquor (44 mL). Lifestyle   Work with your doctor to stay at a healthy weight or to lose weight. Ask  your doctor what the best weight is for you.  Get at least 30 minutes of exercise most days of the week. This may include walking, swimming, or biking.  Get at least 30 minutes of exercise that strengthens your muscles (resistance exercise) at least 3 days a week. This may include lifting weights or doing Pilates.  Do not use any products that contain nicotine or tobacco, such as cigarettes, e-cigarettes, and chewing tobacco. If you need help quitting, ask your doctor.  Check your blood pressure at home as told by  your doctor.  Keep all follow-up visits as told by your doctor. This is important. Medicines  Take over-the-counter and prescription medicines only as told by your doctor. Follow directions carefully.  Do not skip doses of blood pressure medicine. The medicine does not work as well if you skip doses. Skipping doses also puts you at risk for problems.  Ask your doctor about side effects or reactions to medicines that you should watch for. Contact a doctor if you:  Think you are having a reaction to the medicine you are taking.  Have headaches that keep coming back (recurring).  Feel dizzy.  Have swelling in your ankles.  Have trouble with your vision. Get help right away if you:  Get a very bad headache.  Start to feel mixed up (confused).  Feel weak or numb.  Feel faint.  Have very bad pain in your: ? Chest. ? Belly (abdomen).  Throw up more than once.  Have trouble breathing. Summary  Hypertension is another name for high blood pressure.  High blood pressure forces your heart to work harder to pump blood.  For most people, a normal blood pressure is less than 120/80.  Making healthy choices can help lower blood pressure. If your blood pressure does not get lower with healthy choices, you may need to take medicine. This information is not intended to replace advice given to you by your health care provider. Make sure you discuss any questions you have with your health care provider. Document Revised: 09/09/2017 Document Reviewed: 09/09/2017 Elsevier Patient Education  2020 [employer].

## 2019-04-01 NOTE — Progress Notes (Addendum)
Patient ID: [name], male    DOB: [birth year]  MRN: [id number]  CC: Bilateral leg swelling  Subjective: [name] [name] is a 59 y.o. male with history of essential hypertension, mild concentric left ventricular hypertrophy, drug-induced torsades de pointes, sleep apnea, GERD, diabetes type 2, preulcerative calluses, DJD of cervical spine, osteoarthritis, tinea pedis of both feet, hyperlipidemia, low back pain, history of colonic polyps, depression, and herpes zoster who presents for bilateral leg swelling.  1. LEG SWELLING FOLLOW-UP:  Onset: 3 months ago Location: Both  Pain Rating: Denies Makes worse: Sitting in recliner to sleep at night. States that he isn't able to sleep in bed as he has chronic back issues (had surgery in the past) and does not want to have surgery anymore. Currently sleeping in the recliner at night. Says he falls asleep with feet on the floor because he forgets to lift the legs of the recliner and when he awakens in the morning his legs are swollen. Reports has experienced bilateral leg swelling many times before in the past related to sleeping in chair with feet on the floor. Makes better: Reports in the past when he sleeps in bed at night swelling would go down in a few days. Chest pain: Denies Shortness of breath: Denies  Abdominal pain: Denies Fever: Denies Diarrhea: Denies Nausea & Vomiting: Denies Headaches: Denies Muscle aches: Denies Leg warmth: Denies  Color changes: Denies Comments:  Last visit January 2021 at the [facility] emergency department with Dr. Tyrone Nine. During that encounter DVT study ordered (resulted negative), dose of Lasix given inpatient, and a short course of Lasix given at discharge, and to follow-up with primary provider.     Last visit December 2020 with Dr. Wynetta Emery. During that encounter for diabetes management  A1C collected, continue Metformin, Januvia removed, dietary counseling; for hypertension management blood  pressure check with clinical pharmacist, low-salt diet counseling; for hyperlipidemia management continue Lipitor; for chronic low back pain followed by pain specialist and orthopedics. Has not taken blood pressure medications today.   Patient Active Problem List   Diagnosis Date Noted  . Dry mouth 03/26/2018  . Sleep apnea   . Prolonged Q-T interval on ECG 12/03/2016  . Drug-induced torsades de pointes 12/03/2016  . 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 Mild concentric left ventricular hypertrophy (LVH) 09/11/2016  . DJD (degenerative joint disease) of cervical spine 02/26/2016  . Tinea pedis of both feet 06/03/2015  . Pre-ulcerative calluses 06/01/2015  . Type 2 diabetes mellitus with diabetic polyneuropathy ([place]) 06/09/2014  . Herpes zoster 12/05/2008  . Hyperlipidemia 05/11/2008  . DEPRESSION 05/11/2008  . Essential hypertension 05/11/2008  . GERD 05/11/2008  . OSTEOARTHRITIS 05/11/2008  . LOW BACK PAIN 05/11/2008  . COLONIC POLYPS, HX OF 05/11/2008     Current Outpatient Medications on File Prior to Visit  Medication Sig Dispense Refill  . atorvastatin (LIPITOR) 40 MG tablet TAKE 1 TABLET BY MOUTH DAILY AT 6 PM. 30 tablet 2  . Blood Pressure Monitor DEVI Use as directed to check home blood pressure 2-3 times a week 1 Device 0  . buprenorphine (SUBUTEX) 8 MG SUBL SL tablet DISSOLVE 1 T UNDER THE TONGUE QID  0  . carisoprodol (SOMA) 350 MG tablet     . cloNIDine (CATAPRES) 0.1 MG tablet TK 1 T PO TID    . cyclobenzaprine (FLEXERIL) 10 MG tablet TK 1 T PO QHS    . fenofibrate (TRICOR) 145 MG  tablet TAKE 1 TABLET BY MOUTH DAILY. 30 tablet 2  . furosemide (LASIX) 20 MG tablet Take 1 tablet (20 mg total) by mouth daily. 5 tablet 0  . glucose blood test strip Use as instructed    . glucose monitoring kit (FREESTYLE) monitoring kit by Does not apply route.    . hydrochlorothiazide (HYDRODIURIL) 25 MG tablet Take 1 tablet (25 mg total) by  mouth daily. 90 tablet 3  . ibuprofen (ADVIL) 800 MG tablet     . insulin NPH Human (HUMULIN N) 100 UNIT/ML injection Inject 0.08 mLs (8 Units total) into the skin at bedtime. 10 mL 11  . Lancets (FREESTYLE) lancets Use as instructed    . lansoprazole (PREVACID) 15 MG capsule Take 15 mg daily as needed by mouth (for heartburn).     . losartan (COZAAR) 100 MG tablet TAKE 1 TABLET (100 MG TOTAL) BY MOUTH DAILY. (NEEDS AND OFFICE VIST FOR FURTHER REFILLS) 30 tablet 6  . metFORMIN (GLUCOPHAGE-XR) 500 MG 24 hr tablet Take 2 tablets (1,000 mg total) by mouth 2 (two) times daily. 120 tablet 6  . NUCYNTA 100 MG TABS TAKE ONE TABLET BY MOUTH EVERY DAY FOR 3 DAYS, THEN TAKE ONE TABLET TWICE DAILY FOR 3 DAYS, THEN TAKE ONE TABLET FOUR TIMES DAILY    . omeprazole (PRILOSEC) 40 MG capsule TK 1 C QD UTD    . potassium chloride (K-DUR) 10 MEQ tablet Take 1 tablet (10 mEq total) by mouth daily. With use of lasix/furosemide 30 tablet 0  . prazosin (MINIPRESS) 2 MG capsule TK ONE C PO  QHS  5  . pregabalin (LYRICA) 300 MG capsule TK 1 C PO BID    . pregabalin (LYRICA) 75 MG capsule TK 1 C PO QID    . propranolol (INDERAL) 10 MG tablet TK 1 T PO TID    . QUEtiapine (SEROQUEL) 300 MG tablet Take 0.5 tablets (150 mg total) by mouth at bedtime. 30 tablet 6  . sertraline (ZOLOFT) 100 MG tablet TK 1 T PO QAM    . Syringe/Needle, Disp, 30G X 1/2" 1 ML MISC UAD - use as directed 100 each 5  . tiZANidine (ZANAFLEX) 4 MG tablet Take 4 mg by mouth 3 (three) times daily.    . traMADol (ULTRAM) 50 MG tablet TK 2 TS PO QID     No current facility-administered medications on file prior to visit.    Allergies  Allergen Reactions  . Heparin Other (See Comments)    HIT Ab negative on 02/20/15, but SRA POSITIVE   . Oxytetracycline Rash and Other (See Comments)  . Sulfa Antibiotics Other (See Comments)  . Other     Mycins  . Sulfonamide Derivatives Rash    Social History   Socioeconomic History  . Marital status:  Single    Spouse name: Not on file  . Number of children: Not on file  . Years of education: Not on file  . Highest education level: Not on file  Occupational History  . Not on file  Tobacco Use  . Smoking status: Never Smoker  . Smokeless tobacco: Never Used  Substance and Sexual Activity  . Alcohol use: No  . Drug use: No  . Sexual activity: Not Currently  Other Topics Concern  . Not on file  Social History Narrative  . Not on file   Social Determinants of Health   Financial Resource Strain:   . Difficulty of Paying Living Expenses:   Food Insecurity:   .  Worried About Charity fundraiser in the Last Year:   . Arboriculturist in the Last Year:   Transportation Needs:   . Film/video editor (Medical):   Marland Kitchen Lack of Transportation (Non-Medical):   Physical Activity:   . Days of Exercise per Week:   . Minutes of Exercise per Session:   Stress:   . Feeling of Stress :   Social Connections:   . Frequency of Communication with Friends and Family:   . Frequency of Social Gatherings with Friends and Family:   . Attends Religious Services:   . Active Member of Clubs or Organizations:   . Attends Archivist Meetings:   Marland Kitchen Marital Status:   Intimate Partner Violence:   . Fear of Current or Ex-Partner:   . Emotionally Abused:   Marland Kitchen Physically Abused:   . Sexually Abused:     Family History  Problem Relation Age of Onset  . Diabetes Mother   . Hyperlipidemia Mother   . Heart disease Father   . Colon cancer Neg Hx   . Colon polyps Neg Hx   . Esophageal cancer Neg Hx   . Rectal cancer Neg Hx   . Stomach cancer Neg Hx     Past Surgical History:  Procedure Laterality Date  . COLONOSCOPY    . 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 INTRAOPERATIVE TRANSESOPHAGEAL ECHOCARDIOGRAM N/A 11/26/2016   Procedure: INTRAOPERATIVE TRANSESOPHAGEAL ECHOCARDIOGRAM;  Surgeon:  Ivin Poot, MD;  Location: [place];  Service: Open Heart Surgery;  Laterality: N/A;  . POLYPECTOMY    . SHOULDER ARTHROSCOPY WITH ROTATOR CUFF REPAIR Right 2009  . [place]  2010  . TRANSTHORACIC ECHOCARDIOGRAM  07/2016; 12/10/2016   a. Prior to VT arrest: normal. EF 55-60%. Gr 1 DD.  mild LVH. Mildly dilated Aortic Root.;; b.  Normal LV size and function.  Mild LVH.  EF 55%.  No RWMA.  No valve abnormalities.   ROS: Review of Systems Negative except as stated above  PHYSICAL EXAM:  Vitals with BMI 04/01/2019 01/31/2019 01/30/2019  Height - - '6\' 2"'$   Weight 251 lbs 13 oz - 246 lbs  BMI - - 67.59  Systolic 163 846 659  Diastolic 83 81 99  Pulse 53 60 65  SpO2- 98%, room air  Respirations- 16   Physical Exam General appearance - alert, well appearing, and in no distress and oriented to person, place, and time Mental status - alert, oriented to person, place, and time, normal mood, behavior, speech, dress, motor activity, and thought processes Eyes - pupils equal and reactive, extraocular eye movements intact, funduscopic exam normal, discs flat and sharp Chest - clear to auscultation, no wheezes, rales or rhonchi, symmetric air entry, no tachypnea, retractions or cyanosis Heart - normal rate, regular rhythm, normal S1, S2, no murmurs, rubs, clicks or gallops Neurological - alert, oriented, normal speech, no focal findings or movement disorder noted, neck supple without rigidity, cranial nerves II through XII intact, funduscopic exam normal, discs flat and sharp, DTR's normal and symmetric, motor and sensory grossly normal bilaterally, normal muscle tone, no tremors, strength 5/5, Romberg sign negative, normal gait and station Musculoskeletal - no muscular tenderness noted, full range of motion without pain, edema right lower extremity 2+, edema left lower extremity 1+, bilateral ankle edema 2+ Extremities - peripheral pulses normal, no pedal edema, no clubbing or cyanosis, no pedal  edema noted, intact peripheral pulses, feet normal, good pulses, normal color, temperature and sensation, no edema, redness or tenderness in the calves or thighs Skin - normal coloration and turgor, no rashes, no suspicious skin lesions noted  CMP Latest Ref Rng & Units 05/21/2018 03/26/2018 03/19/2018  Glucose 65 - 99 mg/dL 187(H) 360(H) 235(H)  BUN 6 - 24 mg/dL '19 23 16  '$ Creatinine 0.76 - 1.27 mg/dL 1.13 1.20 1.08  Sodium 134 - 144 mmol/L 140 140 140  Potassium 3.5 - 5.2 mmol/L 4.2 4.0 3.9  Chloride 96 - 106 mmol/L 98 100 101  CO2 20 - 29 mmol/L '26 22 23  '$ Calcium 8.7 - 10.2 mg/dL 9.5 9.2 8.9  Total Protein 6.0 - 8.5 g/dL - 6.9 -  Total Bilirubin 0.0 - 1.2 mg/dL - 0.2 -  Alkaline Phos 39 - 117 IU/L - 47 -  AST 0 - 40 IU/L - 16 -  ALT 0 - 44 IU/L - 19 -   Lipid Panel     Component Value Date/Time   CHOL 128 03/26/2018 1023   TRIG 145 03/26/2018 1023   HDL 31 (L) 03/26/2018 1023   CHOLHDL 4.1 03/26/2018 1023   CHOLHDL 5.5 (H) 06/01/2015 1104   VLDL 53 (H) 06/01/2015 1104   LDLCALC 68 03/26/2018 1023    CBC    Component Value Date/Time   WBC 5.1 02/03/2017 1159   WBC 14.7 (H) 11/26/2016 0441   RBC 4.72 02/03/2017 1159   RBC 4.26 11/26/2016 0441   HGB 12.3 (L) 02/03/2017 1159   HCT 37.2 (L) 02/03/2017 1159   PLT 196 02/03/2017 1159   MCV 79 02/03/2017 1159   MCH 26.1 (L) 02/03/2017 1159   MCH 36.4 (H) 11/26/2016 0441   MCHC 33.1 02/03/2017 1159   MCHC 44.0 (H) 11/26/2016 0441   RDW 14.3 02/03/2017 1159   LYMPHSABS 5.4 (H) 11/25/2016 1450   MONOABS 0.9 11/25/2016 1450   EOSABS 0.2 11/25/2016 1450   BASOSABS 0.1 11/25/2016 1450    ASSESSMENT AND PLAN: 1. Dependent edema:   -Dependent edema likely related to sleeping in recliner at night with feet on the floor.  -Considering review of systems and  physical examination dependent edema does not seem likely related to cardiac conditions or pulmonary conditions. Will begin short course of Furosemide. -Last DVT ultrasound of  right lower extremity completed January 2021 resulted negative for DVT.  -Furosemide (Lasix) Take 1 tablet (20 mg total) by mouth daily for 7 days. During this time Hydrochlorothiazide should be discontinued. Once Furosemide is complete then resume Hydrochlorothiazide at prescribed dose.  -May try compression stockings during the day and at night to decrease swelling. Lift legs on recliner while sleeping to help decrease swelling. -Follow-up with primary physician as needed if symptoms do not improve or worsen.  2. Essential hypertension: - CMP14+EGFR - CBC With Differential - TSH -Will collect labs today. Follow-up with primary physician needed in 3 weeks. Last visit with Dr. Wynetta Emery in December 2020.  3. Type 2 diabetes mellitus with diabetic polyneuropathy, with long-term current use of insulin ([place]): - CMP14+EGFR - CBC With Differential - TSH -Will collect labs today. Follow-up with primary physician needed in 3 weeks. Last visit with Dr. Wynetta Emery in December 2020.  4. Hyperlipidemia, unspecified hyperlipidemia type: - Lipid panel - Will collect labs today. Follow-up with primary physician needed in 3 weeks. Last visit with Dr. Wynetta Emery in December 2020.  Patient was given the opportunity to ask questions.  Patient verbalized understanding of  the plan and was able to repeat key elements of the plan. Patient was given clear instructions to go to Emergency Department or return to medical center if symptoms don't improve, worsen, or new problems develop.The patient verbalized understanding.  Requested Prescriptions    No prescriptions requested or ordered in this encounter     Markala Sitts Zachery Dauer, NP

## 2019-04-02 LAB — CBC WITH DIFFERENTIAL
Basophils Absolute: 0 10*3/uL (ref 0.0–0.2)
Basos: 1 %
EOS (ABSOLUTE): 0.2 10*3/uL (ref 0.0–0.4)
Eos: 5 %
Hematocrit: 36.9 % — ABNORMAL LOW (ref 37.5–51.0)
Hemoglobin: 12.5 g/dL — ABNORMAL LOW (ref 13.0–17.7)
Immature Grans (Abs): 0 10*3/uL (ref 0.0–0.1)
Immature Granulocytes: 1 %
Lymphocytes Absolute: 1.1 10*3/uL (ref 0.7–3.1)
Lymphs: 28 %
MCH: 27.4 pg (ref 26.6–33.0)
MCHC: 33.9 g/dL (ref 31.5–35.7)
MCV: 81 fL (ref 79–97)
Monocytes Absolute: 0.2 10*3/uL (ref 0.1–0.9)
Monocytes: 6 %
Neutrophils Absolute: 2.3 10*3/uL (ref 1.4–7.0)
Neutrophils: 59 %
RBC: 4.56 x10E6/uL (ref 4.14–5.80)
RDW: 13.4 % (ref 11.6–15.4)
WBC: 3.8 10*3/uL (ref 3.4–10.8)

## 2019-04-02 LAB — LIPID PANEL
Chol/HDL Ratio: 3.5 ratio (ref 0.0–5.0)
Cholesterol, Total: 105 mg/dL (ref 100–199)
HDL: 30 mg/dL — ABNORMAL LOW (ref >39)
LDL Chol Calc (NIH): 42 mg/dL (ref 0–99)
Triglycerides: 202 mg/dL — ABNORMAL HIGH (ref 0–149)
VLDL Cholesterol Cal: 33 mg/dL (ref 5–40)

## 2019-04-02 LAB — CMP14+EGFR
ALT: 21 IU/L (ref 0–44)
AST: 21 IU/L (ref 0–40)
Albumin/Globulin Ratio: 2 (ref 1.2–2.2)
Albumin: 4.3 g/dL (ref 3.8–4.9)
Alkaline Phosphatase: 82 IU/L (ref 39–117)
BUN/Creatinine Ratio: 23 — ABNORMAL HIGH (ref 9–20)
BUN: 22 mg/dL (ref 6–24)
Bilirubin Total: 0.4 mg/dL (ref 0.0–1.2)
CO2: 22 mmol/L (ref 20–29)
Calcium: 9.2 mg/dL (ref 8.7–10.2)
Chloride: 100 mmol/L (ref 96–106)
Creatinine, Ser: 0.95 mg/dL (ref 0.76–1.27)
GFR calc Af Amer: 102 mL/min/{1.73_m2} (ref >59)
GFR calc non Af Amer: 88 mL/min/{1.73_m2} (ref >59)
Globulin, Total: 2.1 g/dL (ref 1.5–4.5)
Glucose: 257 mg/dL — ABNORMAL HIGH (ref 65–99)
Potassium: 4.3 mmol/L (ref 3.5–5.2)
Sodium: 140 mmol/L (ref 134–144)
Total Protein: 6.4 g/dL (ref 6.0–8.5)

## 2019-04-02 LAB — TSH: TSH: 2.73 u[IU]/mL (ref 0.450–4.500)

## 2019-04-04 ENCOUNTER — Telehealth: Payer: Self-pay

## 2019-04-04 NOTE — Telephone Encounter (Signed)
Contacted pt to go over lab results pt is aware and doesn't have any questions or concerns. Pt states he will call back and schedule appt

## 2019-04-04 NOTE — Progress Notes (Signed)
Please call patient with update.   Sugar high. Continue Metformin as prescribed. Will collect A1C and reassess at visit.   Kidney function slightly elevated. Management of diabetes will help with this.   Blood count slightly low. A diet with iron rich foods such as green leafy vegetables will help with this.   Cholesterol high. Continue Lipitor as prescribed.   Thyroid function normal.  Please remind patient that he needs to schedule an appointment to return in 3 weeks to see primary doctor. Patient has not been seen for management of chronic conditions since 3 months ago.

## 2019-04-06 MED FILL — LOSARTAN POTASSIUM 100 MG T: 100 | 30 days supply | Qty: 30 | Fill #0

## 2019-04-06 MED FILL — HYDROCHLOROTHIAZIDE 25 MG T: 25 | 90 days supply | Qty: 90 | Fill #0

## 2019-04-07 DIAGNOSIS — E119 Type 2 diabetes mellitus without complications: Secondary | ICD-10-CM | POA: Diagnosis not present

## 2019-04-07 DIAGNOSIS — M545 Low back pain: Secondary | ICD-10-CM | POA: Diagnosis not present

## 2019-04-13 ENCOUNTER — Other Ambulatory Visit: Payer: Self-pay | Admitting: Family

## 2019-04-13 MED FILL — metFORMIN HCL ER 500 MG TB2: 500 | 30 days supply | Qty: 120 | Fill #3

## 2019-04-13 MED FILL — FENOFIBRATE 145 MG TABLET: 145 | 30 days supply | Qty: 30 | Fill #2

## 2019-04-14 DIAGNOSIS — M7918 Myalgia, other site: Secondary | ICD-10-CM | POA: Diagnosis not present

## 2019-04-21 DIAGNOSIS — M792 Neuralgia and neuritis, unspecified: Secondary | ICD-10-CM | POA: Diagnosis not present

## 2019-04-21 DIAGNOSIS — M7918 Myalgia, other site: Secondary | ICD-10-CM | POA: Diagnosis not present

## 2019-04-28 DIAGNOSIS — M545 Low back pain: Secondary | ICD-10-CM | POA: Diagnosis not present

## 2019-04-28 DIAGNOSIS — M792 Neuralgia and neuritis, unspecified: Secondary | ICD-10-CM | POA: Diagnosis not present

## 2019-05-02 ENCOUNTER — Telehealth (INDEPENDENT_AMBULATORY_CARE_PROVIDER_SITE_OTHER): Payer: Medicare Other | Admitting: Internal Medicine

## 2019-05-02 ENCOUNTER — Encounter: Payer: Self-pay | Admitting: Internal Medicine

## 2019-05-02 DIAGNOSIS — E1142 Type 2 diabetes mellitus with diabetic polyneuropathy: Secondary | ICD-10-CM | POA: Diagnosis not present

## 2019-05-02 DIAGNOSIS — Z7689 Persons encountering health services in other specified circumstances: Secondary | ICD-10-CM

## 2019-05-02 DIAGNOSIS — Z794 Long term (current) use of insulin: Secondary | ICD-10-CM

## 2019-05-02 DIAGNOSIS — E782 Mixed hyperlipidemia: Secondary | ICD-10-CM | POA: Diagnosis not present

## 2019-05-02 DIAGNOSIS — I10 Essential (primary) hypertension: Secondary | ICD-10-CM | POA: Diagnosis not present

## 2019-05-02 MED ORDER — INSULIN GLARGINE 100 UNIT/ML ~~LOC~~ SOLN: 10.0000 [IU] | Freq: Every day | SUBCUTANEOUS | 11 refills | Status: DC

## 2019-05-02 NOTE — Progress Notes (Signed)
Virtual Visit via Telephone Note  I connected with [name] [name], on 05/02/2019 at 10:31 AM by telephone due to the COVID-19 pandemic and verified that I am speaking with the correct person using two identifiers.   Consent: I discussed the limitations, risks, security and privacy concerns of performing an evaluation and management service by telephone and the availability of in person appointments. I also discussed with the patient that there may be a patient responsible charge related to this service. The patient expressed understanding and agreed to proceed.   Location of Patient: Home   Location of Provider: Clinic    Persons participating in Telemedicine visit: Kenric Lindquist [facility] Dr. Juleen China    History of Present Illness: Patient has a visit to establish care. He was previously seeing Dr. Wynetta Emery at [facility] but is transferring care due to his mother establishing care here.  Diabetes mellitus, Type 2 Disease Monitoring             Blood Sugar Ranges: Fasting - Not checking his blood sugars.              Polyuria: No              Visual problems: no   Urine Microalbumin 11.7 in Jan 2019   Last A1C: 8.9 (12/27/18)   Medications: Metformin 1000 mg BID, Was prescribed insulin but was unable to afford so he is not taking it.  Medication Compliance: no, not with insulin due to cost   Medication Side Effects             Hypoglycemia: no   Chronic HTN Disease Monitoring:  Home BP Monitoring - None  Chest pain- no  Dyspnea- no Headache - no  Medications: Clonidine 0.1 mg TID, HCTZ 25 mg, Losartan 100 mg, Propanolol 10 mg TID  Compliance- yes Lightheadedness- no  Edema- no       Past Medical History:  Diagnosis Date  . Anxiety   . Arthritis   . Blood transfusion without reported diagnosis   . Cataract   . Depressed bipolar disorder ([place])   . Depression   . Diabetes mellitus without complication ([place])   . Diabetic neuropathy  ([place])   . GERD (gastroesophageal reflux disease)   . Herpes zoster 12/05/2008   Qualifier: Diagnosis of  By: Ronnald Ramp MD, Arvid Right.   . 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  Coupled with high doses of loperamide used for pain control.  Unintentional overdose -intractable VT leading to cardiogenic shock - ECMO  . Hyperlipidemia   . Hypertension   . Neuromuscular disorder ([place])    nerve damage back, neck, and shoulder  . Neuropathy in diabetes ([place])   . Sleep apnea    has CPAP but cannot use it  . Substance abuse ([place])    in past    Allergies  Allergen Reactions  . Heparin Other (See Comments)    HIT Ab negative on 02/20/15, but SRA POSITIVE   . Oxytetracycline Rash and Other (See Comments)  . Sulfa Antibiotics Other (See Comments)  . Other     Mycins  . Sulfonamide Derivatives Rash    Current Outpatient Medications on File Prior to Visit  Medication Sig Dispense Refill  . atorvastatin (LIPITOR) 40 MG tablet TAKE 1 TABLET BY MOUTH DAILY AT 6 PM. 30 tablet 2  . buprenorphine (SUBUTEX) 8 MG SUBL SL tablet DISSOLVE 1 T UNDER THE TONGUE QID  0  . carisoprodol (SOMA) 350 MG tablet     . cloNIDine (CATAPRES) 0.1 MG tablet TK 1 T PO TID    . fenofibrate (TRICOR) 145 MG tablet TAKE 1 TABLET BY MOUTH DAILY. 30 tablet 2  . furosemide (LASIX) 20 MG tablet Take 1 tablet (20 mg total) by mouth daily. 7 tablet 0  . hydrochlorothiazide (HYDRODIURIL) 25 MG tablet TAKE 1 TABLET (25 MG TOTAL) BY MOUTH DAILY. 90 tablet 3  . ibuprofen (ADVIL) 800 MG tablet     . insulin NPH Human (HUMULIN N) 100 UNIT/ML injection Inject 0.08 mLs (8 Units total) into the skin at bedtime. 10 mL 11  . Lancets (FREESTYLE) lancets Use as instructed    . lansoprazole (PREVACID) 15 MG capsule Take 15 mg daily as needed by mouth (for heartburn).     . losartan (COZAAR) 100 MG tablet TAKE 1 TABLET (100 MG TOTAL) BY MOUTH DAILY. (NEEDS AND OFFICE VIST FOR  FURTHER REFILLS) 30 tablet 6  . metFORMIN (GLUCOPHAGE-XR) 500 MG 24 hr tablet Take 2 tablets (1,000 mg total) by mouth 2 (two) times daily. 120 tablet 6  . NUCYNTA ER 250 MG TB12 SMARTSIG:1 Tablet(s) By Mouth Every 12 Hours    . omeprazole (PRILOSEC) 40 MG capsule TK 1 C QD UTD    . potassium chloride (K-DUR) 10 MEQ tablet Take 1 tablet (10 mEq total) by mouth daily. With use of lasix/furosemide 30 tablet 0  . prazosin (MINIPRESS) 2 MG capsule TK ONE C PO  QHS  5  . pregabalin (LYRICA) 150 MG capsule Take 150 mg by mouth 4 (four) times daily.    . propranolol (INDERAL) 10 MG tablet TK 1 T PO TID    . QUEtiapine (SEROQUEL) 300 MG tablet Take 0.5 tablets (150 mg total) by mouth at bedtime. 30 tablet 6  . sertraline (ZOLOFT) 100 MG tablet TK 1 T PO QAM    . Syringe/Needle, Disp, 30G X 1/2" 1 ML MISC UAD - use as directed 100 each 5  . traMADol (ULTRAM) 50 MG tablet TK 2 TS PO QID     No current facility-administered medications on file prior to visit.    Observations/Objective: NAD. Speaking clearly.  Work of breathing normal.  Alert and oriented. Mood appropriate.   Assessment and Plan: 1. Encounter to establish care   2. Type 2 diabetes mellitus with diabetic polyneuropathy, with long-term current use of insulin (HCC) Last A1c above goal of 7.0. Patient only on Metformin due to cost of insulin that was prescribed. All insulins are tier 3 so will plan to do prior authorization. Have prescribed starting Lantus dose of 10 units daily. Check A1c. On Arb therapy.  Counseled on Diabetic diet, my plate method, [id number] minutes of moderate intensity exercise/week Blood sugar logs with fasting goals of 80-120 mg/dl, random of less than 180 and in the event of sugars less than 60 mg/dl or greater than 400 mg/dl encouraged to notify the clinic. Advised on the need for annual eye exams, annual foot exams, Pneumonia vaccine. - Hemoglobin A1c; Future - Microalbumin / creatinine urine ratio; Future -  insulin glargine (LANTUS) 100 UNIT/ML injection; Inject 0.1 mLs (10 Units total) into the skin daily.  Dispense: 10 mL; Refill: 11  3. Essential hypertension Continue current regimen. Asymptomatic. Recent CMET checked in March with stable electrolytes and kidney function.  Counseled on blood pressure goal of less than 130/80, low-sodium, DASH diet, medication compliance, 150 minutes of moderate intensity exercise per week. Discussed  medication compliance, adverse effects.  4. Mixed hyperlipidemia Last LDL at goal with result of 42 in March 2021. Continue Lipitor.     Follow Up Instructions: Lab visit 4/21   I discussed the assessment and treatment plan with the patient. The patient was provided an opportunity to ask questions and all were answered. The patient agreed with the plan and demonstrated an understanding of the instructions.   The patient was advised to call back or seek an in-person evaluation if the symptoms worsen or if the condition fails to improve as anticipated.     I provided 24 minutes total of non-face-to-face time during this encounter including median intraservice time, reviewing previous notes, investigations, ordering medications, medical decision making, coordinating care and patient verbalized understanding at the end of the visit.    Phill Myron, D.O. Primary Care at [facility]  05/02/2019, 10:31 AM

## 2019-05-04 ENCOUNTER — Other Ambulatory Visit (INDEPENDENT_AMBULATORY_CARE_PROVIDER_SITE_OTHER): Payer: Medicare Other

## 2019-05-04 ENCOUNTER — Other Ambulatory Visit: Payer: Self-pay

## 2019-05-04 DIAGNOSIS — E1142 Type 2 diabetes mellitus with diabetic polyneuropathy: Secondary | ICD-10-CM

## 2019-05-04 DIAGNOSIS — Z794 Long term (current) use of insulin: Secondary | ICD-10-CM

## 2019-05-05 LAB — MICROALBUMIN / CREATININE URINE RATIO
Creatinine, Urine: 126 mg/dL
Microalb/Creat Ratio: 8 mg/g creat (ref 0–29)
Microalbumin, Urine: 9.5 ug/mL

## 2019-05-05 LAB — HEMOGLOBIN A1C
Est. average glucose Bld gHb Est-mCnc: 240 mg/dL
Hgb A1c MFr Bld: 10 % — ABNORMAL HIGH (ref 4.8–5.6)

## 2019-05-12 DIAGNOSIS — E119 Type 2 diabetes mellitus without complications: Secondary | ICD-10-CM | POA: Diagnosis not present

## 2019-05-13 ENCOUNTER — Ambulatory Visit: Payer: Medicare Other | Admitting: Podiatry

## 2019-05-16 MED FILL — LOSARTAN POTASSIUM 100 MG T: 100 | 30 days supply | Qty: 30 | Fill #1

## 2019-05-16 MED FILL — metFORMIN HCL ER 500 MG TB2: 500 | 30 days supply | Qty: 120 | Fill #4

## 2019-05-19 DIAGNOSIS — G894 Chronic pain syndrome: Secondary | ICD-10-CM | POA: Diagnosis not present

## 2019-05-26 DIAGNOSIS — E119 Type 2 diabetes mellitus without complications: Secondary | ICD-10-CM | POA: Diagnosis not present

## 2019-05-26 DIAGNOSIS — M545 Low back pain: Secondary | ICD-10-CM | POA: Diagnosis not present

## 2019-06-02 DIAGNOSIS — E119 Type 2 diabetes mellitus without complications: Secondary | ICD-10-CM | POA: Diagnosis not present

## 2019-06-02 DIAGNOSIS — M792 Neuralgia and neuritis, unspecified: Secondary | ICD-10-CM | POA: Diagnosis not present

## 2019-06-09 DIAGNOSIS — M545 Low back pain: Secondary | ICD-10-CM | POA: Diagnosis not present

## 2019-06-09 DIAGNOSIS — M792 Neuralgia and neuritis, unspecified: Secondary | ICD-10-CM | POA: Diagnosis not present

## 2019-06-16 DIAGNOSIS — E119 Type 2 diabetes mellitus without complications: Secondary | ICD-10-CM | POA: Diagnosis not present

## 2019-06-17 ENCOUNTER — Ambulatory Visit: Payer: Medicare Other | Admitting: Podiatry

## 2019-06-19 DIAGNOSIS — M545 Low back pain: Secondary | ICD-10-CM | POA: Diagnosis not present

## 2019-06-19 DIAGNOSIS — M792 Neuralgia and neuritis, unspecified: Secondary | ICD-10-CM | POA: Diagnosis not present

## 2019-06-22 ENCOUNTER — Other Ambulatory Visit: Payer: Self-pay | Admitting: Internal Medicine

## 2019-06-22 DIAGNOSIS — E785 Hyperlipidemia, unspecified: Secondary | ICD-10-CM

## 2019-06-23 DIAGNOSIS — E119 Type 2 diabetes mellitus without complications: Secondary | ICD-10-CM | POA: Diagnosis not present

## 2019-06-23 DIAGNOSIS — M545 Low back pain: Secondary | ICD-10-CM | POA: Diagnosis not present

## 2019-06-30 DIAGNOSIS — M545 Low back pain: Secondary | ICD-10-CM | POA: Diagnosis not present

## 2019-06-30 DIAGNOSIS — M792 Neuralgia and neuritis, unspecified: Secondary | ICD-10-CM | POA: Diagnosis not present

## 2019-07-04 ENCOUNTER — Other Ambulatory Visit: Payer: Self-pay

## 2019-07-04 ENCOUNTER — Ambulatory Visit
Admission: EM | Admit: 2019-07-04 | Discharge: 2019-07-04 | Disposition: A | Discharge status: Home / Self Care | Payer: Medicare Other

## 2019-07-04 DIAGNOSIS — R600 Localized edema: Secondary | ICD-10-CM

## 2019-07-04 HISTORY — DX: Pure hypercholesterolemia, unspecified: E78.00

## 2019-07-04 HISTORY — DX: Other chronic pain: G89.29

## 2019-07-04 HISTORY — DX: Bipolar disorder, unspecified: F31.9

## 2019-07-04 MED ORDER — FUROSEMIDE 20 MG PO TABS: 20.0000 mg | ORAL_TABLET | Freq: Every day | ORAL | 0 refills | Status: DC

## 2019-07-04 NOTE — ED Provider Notes (Signed)
[name]    CSN: [id number] Arrival date & time: 07/04/19  1201      History   Chief Complaint Chief Complaint  Patient presents with  . Leg Swelling    HPI [name] [name] is a 59 y.o. male.   59 year old male with history of DM, HTN, HLD comes in for chronic bilateral leg swelling. This has been present for months, has felt worsening this past month as he has been fulling asleep on his recliner. Denies any injury/trauma. Has wounds to the shin at times. Has noticed redness, for which is chronic as well, denies spreading erythema, warmth, fever. Denies chest pain, shortness of breath, orthopnea.      Past Medical History:  Diagnosis Date  . Bipolar 1 disorder ([place])   . Chronic pain   . Diabetes mellitus without complication ([place])   . High cholesterol   . Hypertension     There are no problems to display for this patient.   History reviewed. No pertinent surgical history.     Home Medications    Prior to Admission medications   Medication Sig Start Date End Date Taking? Authorizing Provider  amitriptyline (ELAVIL) 50 MG tablet Take 50 mg by mouth at bedtime.   Yes [provider]  atorvastatin (LIPITOR) 40 MG tablet Take 40 mg by mouth daily.   Yes [provider]  buprenorphine (SUBUTEX) 8 MG SUBL SL tablet Place under the tongue daily. 3 tabs daily   Yes [provider]  fenofibrate (TRICOR) 145 MG tablet Take 145 mg by mouth daily.   Yes [provider]  hydrochlorothiazide (HYDRODIURIL) 25 MG tablet Take 25 mg by mouth daily.   Yes [provider]  losartan (COZAAR) 100 MG tablet Take 100 mg by mouth daily.   Yes [provider]  metFORMIN (GLUCOPHAGE) 500 MG tablet Take 1,000 mg by mouth 2 (two) times daily with a meal.   Yes [provider]  omeprazole (PRILOSEC) 40 MG capsule Take 40 mg by mouth daily.   Yes [provider]  prazosin (MINIPRESS) 2 MG capsule Take 2 mg  by mouth at bedtime.   Yes [provider]  pregabalin (LYRICA) 300 MG capsule Take 300 mg by mouth daily.   Yes [provider]  QUEtiapine (SEROQUEL) 300 MG tablet Take 150 mg by mouth at bedtime.   Yes [provider]  sertraline (ZOLOFT) 100 MG tablet Take 100 mg by mouth daily.   Yes [provider]  furosemide (LASIX) 20 MG tablet Take 1-2 tablets (20-40 mg total) by mouth daily. 07/04/19   Ok Edwards, PA-C    Family History History reviewed. No pertinent family history.  Social History Social History   Tobacco Use  . Smoking status: Never Smoker  . Smokeless tobacco: Never Used  Substance Use Topics  . Alcohol use: Not Currently  . Drug use: Not on file     Allergies   Del-mycin [erythromycin], Sulfa antibiotics, and Heparin   Review of Systems Review of Systems  Reason unable to perform ROS: See HPI as above.     Physical Exam Triage Vital Signs ED Triage Vitals  Enc Vitals Group     BP 07/04/19 1218 112/70     Pulse Rate 07/04/19 1218 (!) 52     Resp 07/04/19 1218 18     Temp 07/04/19 1218 98.1 F (36.7 C)     Temp Source 07/04/19 1218 Oral     SpO2  07/04/19 1218 94 %     Weight --      Height --      Head Circumference --      Peak Flow --      Pain Score 07/04/19 1219 5     Pain Loc --      Pain Edu? --      Excl. in [place]? --    No data found.  Updated Vital Signs BP 112/70 (BP Location: Left Arm)   Pulse (!) 52   Temp 98.1 F (36.7 C) (Oral)   Resp 18   SpO2 94%   Physical Exam Constitutional:      General: He is not in acute distress.    Appearance: Normal appearance. He is well-developed. He is not toxic-appearing or diaphoretic.  HENT:     Head: Normocephalic and atraumatic.  Eyes:     Conjunctiva/sclera: Conjunctivae normal.     Pupils: Pupils are equal, round, and reactive to light.  Cardiovascular:     Rate and Rhythm: Regular rhythm. Bradycardia present.     Heart sounds: No murmur heard.  No  friction rub. No gallop.   Pulmonary:     Effort: Pulmonary effort is normal. No respiratory distress.     Comments: Speaking in full sentences without difficulty. LCTAB Musculoskeletal:     Cervical back: Normal range of motion and neck supple.     Comments: Bilateral leg swelling with 2-3+ pitting edema to the shin. Erythema to BLE without warmth/tenderness. Few scabbing wound to the anterior shin without drainage. No ulcerations noted. Full ROM of BLE. Sensation intact. Pedal pulse 2+  Skin:    General: Skin is warm and dry.  Neurological:     Mental Status: He is alert and oriented to person, place, and time.      UC Treatments / Results  Labs (all labs ordered are listed, but only abnormal results are displayed) Labs Reviewed - No data to display  EKG   Radiology No results found.  Procedures Procedures (including critical care time)  Medications Ordered in UC Medications - No data to display  Initial Impression / Assessment and Plan / UC Course  I have reviewed the triage vital signs and the nursing notes.  Pertinent labs & imaging results that were available during my care of the patient were reviewed by me and considered in my medical decision making (see chart for details).    Patient with lab work a few months ago, normal creatinine. a1c 10. Currently without symptoms of CHF, LCTAB. Will provide short course of lasix for swelling. Ace wrap for swelling. Elevation, decrease salt intake. Follow up with PCP as scheduled for reevaluation. Return precautions given.  Final Clinical Impressions(s) / UC Diagnoses   Final diagnoses:  Edema of both lower legs   ED Prescriptions    Medication Sig Dispense Auth. Provider   furosemide (LASIX) 20 MG tablet Take 1-2 tablets (20-40 mg total) by mouth daily. 10 tablet Ok Edwards, PA-C     PDMP not reviewed this encounter.   Ok Edwards, PA-C 07/04/19 1255

## 2019-07-04 NOTE — ED Triage Notes (Signed)
Pt c/o swelling, redness, and sores to bilateral lower extremities for over a month. States has an appt with new PCP in the end of July.

## 2019-07-04 NOTE — Discharge Instructions (Signed)
Start lasix as directed. Elevation, pressure dressing. Limit salt intake. Follow up with PCP for further evaluation and management needed. If having shortness of breath, chest pain, go to the emergency department for further evaluation.

## 2019-07-05 ENCOUNTER — Other Ambulatory Visit: Payer: Self-pay

## 2019-07-05 ENCOUNTER — Other Ambulatory Visit (INDEPENDENT_AMBULATORY_CARE_PROVIDER_SITE_OTHER): Payer: Medicare Other | Admitting: Internal Medicine

## 2019-07-05 ENCOUNTER — Telehealth: Payer: Self-pay

## 2019-07-05 DIAGNOSIS — L0390 Cellulitis, unspecified: Secondary | ICD-10-CM | POA: Diagnosis not present

## 2019-07-05 DIAGNOSIS — B9689 Other specified bacterial agents as the cause of diseases classified elsewhere: Secondary | ICD-10-CM | POA: Diagnosis not present

## 2019-07-05 MED ORDER — SULFAMETHOXAZOLE-TRIMETHOPRIM 400-80 MG PO TABS: 1.0000 | ORAL_TABLET | Freq: Two times a day (BID) | ORAL | 0 refills | Status: DC

## 2019-07-05 NOTE — Telephone Encounter (Signed)
He told me that he didn't have any antibiotic allergies. It does list he gets a rash. Would clarify this with patient and if he states no allergies should be safe to take.   Phill Myron, D.O. Primary Care at [facility]  07/05/2019, 4:58 PM

## 2019-07-05 NOTE — Telephone Encounter (Signed)
Received fax from [place] about patient's Bactrim.  They want to clarify if patient is to take antibiotic since he has a Sulfa allergy. Please advise.

## 2019-07-05 NOTE — Progress Notes (Signed)
[name] [name] accompanied his mother to her appointment 07/05/19. He brought up concerns about LE edema. Went to Urgent Care yesterday and was prescribed short course of Lasix. On my exam, he has warmth and erythema to right LE with weeping. Urgent care note reports no warmth to the area. I am concerned that a secondary bacterial cellulitis is also developing. Will start course of Bactrim. Patient reports he has no drug allergies. Have added him to my schedule 7/12 for follow up. Precautions to seek care in interim discussed.   Phill Myron, D.O. Primary Care at [facility]  07/05/2019, 10:40 AM

## 2019-07-06 ENCOUNTER — Other Ambulatory Visit: Payer: Self-pay | Admitting: Internal Medicine

## 2019-07-06 DIAGNOSIS — B9689 Other specified bacterial agents as the cause of diseases classified elsewhere: Secondary | ICD-10-CM

## 2019-07-06 DIAGNOSIS — L0390 Cellulitis, unspecified: Secondary | ICD-10-CM

## 2019-07-06 MED ORDER — CLINDAMYCIN HCL 150 MG PO CAPS: 150.0000 mg | ORAL_CAPSULE | Freq: Three times a day (TID) | ORAL | 0 refills | Status: DC

## 2019-07-06 MED FILL — CLINDAMYCIN HCL 150 MG CAPS: 150 | 7 days supply | Qty: 21 | Fill #0

## 2019-07-06 NOTE — Telephone Encounter (Signed)
Patient returned call & states that he is allergic to Sulfa and would like a different Rx called in.

## 2019-07-06 NOTE — Telephone Encounter (Signed)
Left voice mail

## 2019-07-06 NOTE — Telephone Encounter (Signed)
I sent in Rx for Clindamycin instead. Take 3x per day for 7 days.   Phill Myron, D.O. Primary Care at [facility]  07/06/2019, 2:18 PM

## 2019-07-07 ENCOUNTER — Encounter: Payer: Self-pay | Admitting: Internal Medicine

## 2019-07-07 DIAGNOSIS — E119 Type 2 diabetes mellitus without complications: Secondary | ICD-10-CM | POA: Diagnosis not present

## 2019-07-07 DIAGNOSIS — M7918 Myalgia, other site: Secondary | ICD-10-CM | POA: Diagnosis not present

## 2019-07-07 DIAGNOSIS — M545 Low back pain: Secondary | ICD-10-CM | POA: Diagnosis not present

## 2019-07-08 ENCOUNTER — Ambulatory Visit: Payer: Medicare Other | Admitting: Podiatry

## 2019-07-14 DIAGNOSIS — M545 Low back pain: Secondary | ICD-10-CM | POA: Diagnosis not present

## 2019-07-21 DIAGNOSIS — M545 Low back pain: Secondary | ICD-10-CM | POA: Diagnosis not present

## 2019-07-22 ENCOUNTER — Telehealth: Payer: Self-pay

## 2019-07-22 NOTE — Telephone Encounter (Signed)
Called patient to do their pre-visit COVID screening.  Have you tested positive for COVID or are you currently waiting for COVID test results? no  Have you recently traveled internationally([place], [place], [place], [place], [place]) or within the [place] to a hotspot area([place], [place], [place], [place], [place])? no  Are you currently experiencing any of the following symptoms: fever, cough, SHOB, fatigue, body aches, loss of smell/taste, rash, diarrhea, vomiting, severe headaches, weakness, sore throat? no  Have you been in contact with anyone who has recently travelled? non  Have you been in contact with anyone who is experiencing any of the above symptoms or been diagnosed with [place]  or works in or has recently visited a SNF? o

## 2019-07-25 ENCOUNTER — Encounter: Payer: Self-pay | Admitting: Internal Medicine

## 2019-07-25 ENCOUNTER — Other Ambulatory Visit: Payer: Self-pay

## 2019-07-25 ENCOUNTER — Other Ambulatory Visit: Payer: Self-pay | Admitting: Internal Medicine

## 2019-07-25 ENCOUNTER — Ambulatory Visit (INDEPENDENT_AMBULATORY_CARE_PROVIDER_SITE_OTHER): Payer: Medicare Other | Admitting: Internal Medicine

## 2019-07-25 VITALS — BP 142/80 | HR 61 | Temp 97.3°F | Resp 17 | Wt 244.0 lb

## 2019-07-25 DIAGNOSIS — Z794 Long term (current) use of insulin: Secondary | ICD-10-CM | POA: Diagnosis not present

## 2019-07-25 DIAGNOSIS — R600 Localized edema: Secondary | ICD-10-CM | POA: Diagnosis not present

## 2019-07-25 DIAGNOSIS — I10 Essential (primary) hypertension: Secondary | ICD-10-CM

## 2019-07-25 DIAGNOSIS — Z1211 Encounter for screening for malignant neoplasm of colon: Secondary | ICD-10-CM

## 2019-07-25 DIAGNOSIS — E1142 Type 2 diabetes mellitus with diabetic polyneuropathy: Secondary | ICD-10-CM

## 2019-07-25 DIAGNOSIS — E782 Mixed hyperlipidemia: Secondary | ICD-10-CM

## 2019-07-25 LAB — GLUCOSE, POCT (MANUAL RESULT ENTRY): POC Glucose: 243 mg/dl — AB (ref 70–99)

## 2019-07-25 LAB — POCT GLYCOSYLATED HEMOGLOBIN (HGB A1C): Hemoglobin A1C: 9.2 % — AB (ref 4.0–5.6)

## 2019-07-25 MED ORDER — METFORMIN HCL ER 500 MG PO TB24: 1000.0000 mg | ORAL_TABLET | Freq: Two times a day (BID) | ORAL | 5 refills | Status: DC

## 2019-07-25 MED ORDER — ATORVASTATIN CALCIUM 40 MG PO TABS: ORAL_TABLET | ORAL | 5 refills | Status: DC

## 2019-07-25 MED ORDER — LOSARTAN POTASSIUM 100 MG PO TABS: 100.0000 mg | ORAL_TABLET | Freq: Every day | ORAL | 5 refills | Status: DC

## 2019-07-25 MED ORDER — TRULICITY 0.75 MG/0.5ML ~~LOC~~ SOAJ: 0.7500 mg | SUBCUTANEOUS | 2 refills | Status: DC

## 2019-07-25 MED ORDER — HYDROCHLOROTHIAZIDE 25 MG PO TABS: 25.0000 mg | ORAL_TABLET | Freq: Every day | ORAL | 5 refills | Status: DC

## 2019-07-25 MED ORDER — FENOFIBRATE 145 MG PO TABS: 145.0000 mg | ORAL_TABLET | Freq: Every day | ORAL | 5 refills | Status: DC

## 2019-07-25 MED FILL — LOSARTAN POTASSIUM 100 MG T: 100 | 30 days supply | Qty: 30 | Fill #0

## 2019-07-25 MED FILL — FENOFIBRATE 145 MG TABLET: 145 | 30 days supply | Qty: 30 | Fill #0

## 2019-07-25 MED FILL — metFORMIN HCL ER 500 MG TB2: 500 | 30 days supply | Qty: 120 | Fill #0

## 2019-07-25 MED FILL — ATORVASTATIN CALCIUM 40 MG: 40 | 30 days supply | Qty: 30 | Fill #0

## 2019-07-25 MED FILL — HYDROCHLOROTHIAZIDE 25 MG T: 25 | 30 days supply | Qty: 30 | Fill #0

## 2019-07-25 NOTE — Progress Notes (Signed)
Subjective:    [name] [name] - 59 y.o. male MRN [id number]  Date of birth: 18-Apr-1960  HPI  [name] [name] is here for follow up of chronic medical conditions.  Diabetes mellitus, Type 2 Disease Monitoring             Blood Sugar Ranges: Not monitoring as knows will be high              Polyuria: no             Visual problems: no   Urine Microalbumin 8 (April 2021). On Arb therapy.   Last A1C: 10.0 (April 2021)   Medications: Metformin 1000 mg BID, not taking insulin due to cost  Medication Compliance: no, see above regarding insulin   Medication Side Effects             Hypoglycemia: no     Concerns about bilateral lower extremity edema. Has been chronic. Non painful. Has worsened recently with some small ulcerations, most seem to be healing well now.     Health Maintenance:  Health Maintenance Due  Topic Date Due  . COVID-19 Vaccine (1) Never done  . COLONOSCOPY  08/31/2015  . OPHTHALMOLOGY EXAM  09/04/2017    -  reports that he has never smoked. He has never used smokeless tobacco. - Review of Systems: Per HPI. - Past Medical History: Patient Active Problem List   Diagnosis Date Noted  . Sleep apnea   . Prolonged Q-T interval on ECG 12/03/2016  . Drug-induced torsades de pointes 12/03/2016  . 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 Mild concentric left ventricular hypertrophy (LVH) 09/11/2016  . DJD (degenerative joint disease) of cervical spine 02/26/2016  . Tinea pedis of both feet 06/03/2015  . Pre-ulcerative calluses 06/01/2015  . Type 2 diabetes mellitus with diabetic polyneuropathy ([place]) 06/09/2014  . Herpes zoster 12/05/2008  . Hyperlipemia, mixed 05/11/2008  . DEPRESSION 05/11/2008  . Essential hypertension 05/11/2008  . GERD 05/11/2008  . OSTEOARTHRITIS 05/11/2008  . LOW BACK PAIN 05/11/2008  . COLONIC POLYPS, HX OF 05/11/2008   - Medications: reviewed and updated   Objective:   Physical  Exam BP (!) 142/80   Pulse 61   Temp (!) 97.3 F (36.3 C) (Temporal)   Resp 17   Wt 244 lb (110.7 kg)   SpO2 96%   BMI 31.33 kg/m  Physical Exam Constitutional:      General: He is not in acute distress.    Appearance: He is not diaphoretic.  HENT:     Head: Normocephalic and atraumatic.  Eyes:     Conjunctiva/sclera: Conjunctivae normal.  Cardiovascular:     Rate and Rhythm: Normal rate and regular rhythm.     Heart sounds: Normal heart sounds. No murmur heard.   Pulmonary:     Effort: Pulmonary effort is normal. No respiratory distress.     Breath sounds: Normal breath sounds.  Musculoskeletal:        General: Normal range of motion.  Skin:    General: Skin is warm and dry.     Comments: LE edema 1+ bilaterally with skin color changes that appear consistent with chronic venous stasis and small ulceration that is superficial located on left medial lower leg.   Neurological:     Mental Status: He is alert and oriented to person, place, and time.  Psychiatric:        Mood and Affect: Affect normal.  Judgment: Judgment normal.            Assessment & Plan:    1. Edema of both lower legs Appears consistent with chronic venous statis with ulceration complication by uncontrolled T2DM. Will refer to vascular for evaluation. Recommended compression and elevation. Discussed signs of secondary infection.  - Ambulatory referral to Vascular Surgery  2. Type 2 diabetes mellitus with diabetic polyneuropathy, with long-term current use of insulin (HCC) A1c 9.2. Working on diet changes. Would like Rx for once a week injectable as now has insurance. Will give Rx to start Trulicity. Encouraged monitoring CBGs at home. Return in 3 months.  - metFORMIN (GLUCOPHAGE-XR) 500 MG 24 hr tablet; Take 2 tablets (1,000 mg total) by mouth 2 (two) times daily.  Dispense: 120 tablet; Refill: 5 - Dulaglutide (TRULICITY) 1.49 FW/2.6VZ SOPN; Inject 0.5 mLs (0.75 mg total) into the skin once a  week.  Dispense: 2 mL; Refill: 2  3. Essential hypertension Near goal. Asymptomatic. Continue current regimen.  - hydrochlorothiazide (HYDRODIURIL) 25 MG tablet; Take 1 tablet (25 mg total) by mouth daily.  Dispense: 30 tablet; Refill: 5 - losartan (COZAAR) 100 MG tablet; Take 1 tablet (100 mg total) by mouth daily.  Dispense: 30 tablet; Refill: 5  4. Mixed hyperlipidemia - atorvastatin (LIPITOR) 40 MG tablet; TAKE 1 TABLET BY MOUTH DAILY AT 6 PM.  Dispense: 30 tablet; Refill: 5 - fenofibrate (TRICOR) 145 MG tablet; Take 1 tablet (145 mg total) by mouth daily.  Dispense: 30 tablet; Refill: 5  5. Colon cancer screening - Ambulatory referral to Gastroenterology     Phill Myron, D.O. 07/25/2019, 11:21 AM Primary Care at [facility]

## 2019-07-29 MED FILL — CLINDAMYCIN HCL 150 MG CAPS: 150 | 7 days supply | Qty: 21 | Fill #0

## 2019-08-08 ENCOUNTER — Ambulatory Visit: Payer: Medicare Other | Attending: Internal Medicine

## 2019-08-08 DIAGNOSIS — Z23 Encounter for immunization: Secondary | ICD-10-CM

## 2019-08-08 NOTE — Progress Notes (Signed)
   Covid-19 Vaccination Clinic  Name:  [name] [name]    MRN: [id number] DOB: 07-11-1960  08/08/2019  [name] was observed post Covid-19 immunization for 15 minutes without incident. He was provided with Vaccine Information Sheet and instruction to access the V-Safe system.   Mr. Gum was instructed to call 911 with any severe reactions post vaccine: Marland Kitchen Difficulty breathing  . Swelling of face and throat  . A fast heartbeat  . A bad rash all over body  . Dizziness and weakness   Immunizations Administered    Name Date Dose VIS Date Route   JANSSEN COVID-19 VACCINE 08/08/2019 10:14 AM 0.5 mL 03/12/2019 Intramuscular   Manufacturer: [employer]ph[employer]   Lot: [id number]   [place]: [id number]

## 2019-08-11 ENCOUNTER — Ambulatory Visit: Payer: Medicare Other | Admitting: Internal Medicine

## 2019-08-31 MED FILL — metFORMIN HCL ER 500 MG TB2: 500 | 30 days supply | Qty: 120 | Fill #1

## 2019-08-31 MED FILL — FENOFIBRATE 145 MG TABLET: 145 | 30 days supply | Qty: 30 | Fill #1

## 2019-08-31 MED FILL — ATORVASTATIN CALCIUM 40 MG: 40 | 30 days supply | Qty: 30 | Fill #1

## 2019-08-31 MED FILL — LOSARTAN POTASSIUM 100 MG T: 100 | 30 days supply | Qty: 30 | Fill #1

## 2019-08-31 MED FILL — HYDROCHLOROTHIAZIDE 25 MG T: 25 | 30 days supply | Qty: 30 | Fill #1

## 2019-09-13 ENCOUNTER — Other Ambulatory Visit: Payer: Self-pay

## 2019-09-13 DIAGNOSIS — E785 Hyperlipidemia, unspecified: Secondary | ICD-10-CM | POA: Insufficient documentation

## 2019-09-13 DIAGNOSIS — I872 Venous insufficiency (chronic) (peripheral): Secondary | ICD-10-CM

## 2019-09-13 DIAGNOSIS — G894 Chronic pain syndrome: Secondary | ICD-10-CM | POA: Insufficient documentation

## 2019-09-13 DIAGNOSIS — G629 Polyneuropathy, unspecified: Secondary | ICD-10-CM | POA: Insufficient documentation

## 2019-09-13 DIAGNOSIS — M5136 Other intervertebral disc degeneration, lumbar region: Secondary | ICD-10-CM | POA: Insufficient documentation

## 2019-09-13 DIAGNOSIS — E669 Obesity, unspecified: Secondary | ICD-10-CM | POA: Insufficient documentation

## 2019-09-23 NOTE — Progress Notes (Addendum)
VASCULAR & VEIN SPECIALISTS           OF [place]  History and Physical   [name] [name] is a 59 y.o. male who presents with BLE edema that is chronic.  He saw his PCP in July.  He had developed some small ulcerations complicated by uncontrolled DM, but they were healing.   Elevation and compression were recommended with referral to vascular surgery.    He states that his legs have been swelling for about a year.  He states that they used to swell and get better after elevating them but they now stay swollen.  He states that his legs feel heavy like he is carrying around two heavy logs when he walks.  He does not get cramping in his calves when he walks.   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  He states that he has never had a DVT.  He does have skin discoloration in both lower legs.  He wore knee high compression once, but they were hot and he did not continue wearing them.  He has had some ulcers in the past but they got better.  He does have a couple of small ulcers on his legs that are new. He does not elevate his legs as he sits in his recliner.    Pt is now in the ministry.  He wants to change his habits and eat better and get his DM under better control.    The pt is on a statin for cholesterol management.  The pt is not on a daily aspirin.   Other AC:  none The pt is on ARB,  for hypertension.   The pt is diabetic.   Tobacco hx:  never   Past Medical History:  Diagnosis Date  . Anxiety   . Arthritis   . Bipolar 1 disorder ([place])   . Blood transfusion without reported diagnosis   . Cataract   . Chronic pain   . Depressed bipolar disorder ([place])   . Depression   . Diabetes mellitus without complication ([place])   . Diabetic neuropathy ([place])   . GERD (gastroesophageal reflux disease)   . Herpes zoster 12/05/2008   Qualifier: Diagnosis of  By: Ronnald Ramp MD, Arvid Right.   . High cholesterol   . 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  Coupled with high doses of loperamide used for pain control.  Unintentional overdose -intractable VT leading to cardiogenic shock - ECMO  . Hyperlipidemia   . Hypertension   . Neuromuscular disorder ([place])    nerve damage back, neck, and shoulder  . Neuropathy in diabetes ([place])   . Sleep apnea    has CPAP but cannot use it  . Substance abuse ([place])    in past     Past Surgical History:  Procedure Laterality Date  . COLONOSCOPY    . 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 INTRAOPERATIVE TRANSESOPHAGEAL ECHOCARDIOGRAM N/A 11/26/2016   Procedure: INTRAOPERATIVE TRANSESOPHAGEAL ECHOCARDIOGRAM;  Surgeon: Ivin Poot, MD;  Location: [facility];  Service: Open Heart Surgery;  Laterality: N/A;  . POLYPECTOMY    . SHOULDER ARTHROSCOPY WITH ROTATOR CUFF REPAIR Right 2009  . [place]  2010  . TRANSTHORACIC ECHOCARDIOGRAM  07/2016;  12/10/2016   a. Prior to VT arrest: normal. EF 55-60%. Gr 1 DD.  mild LVH. Mildly dilated Aortic Root.;; b.  Normal LV size and function.  Mild LVH.  EF 55%.  No RWMA.  No valve abnormalities.    Social History   Socioeconomic History  . Marital status: Divorced    Spouse name: Not on file  . Number of children: Not on file  . Years of education: Not on file  . Highest education level: Not on file  Occupational History  . Not on file  Tobacco Use  . Smoking status: Never Smoker  . Smokeless tobacco: Never Used  Substance and Sexual Activity  . Alcohol use: Not Currently  . Drug use: No  . Sexual activity: Not Currently  Other Topics Concern  . Not on file  Social History Narrative   ** Merged History Encounter **       Social Determinants of Health   Financial Resource Strain:   . Difficulty of Paying Living Expenses: Not on file  Food Insecurity:     . Worried About Charity fundraiser in the Last Year: Not on file  . Ran Out of Food in the Last Year: Not on file  Transportation Needs:   . Lack of Transportation (Medical): Not on file  . Lack of Transportation (Non-Medical): Not on file  Physical Activity:   . Days of Exercise per Week: Not on file  . Minutes of Exercise per Session: Not on file  Stress:   . Feeling of Stress : Not on file  Social Connections:   . Frequency of Communication with Friends and Family: Not on file  . Frequency of Social Gatherings with Friends and Family: Not on file  . Attends Religious Services: Not on file  . Active Member of Clubs or Organizations: Not on file  . Attends Archivist Meetings: Not on file  . Marital Status: Not on file  Intimate Partner Violence:   . Fear of Current or Ex-Partner: Not on file  . Emotionally Abused: Not on file  . Physically Abused: Not on file  . Sexually Abused: Not on file    Family History  Problem Relation Age of Onset  . Diabetes Mother   . Hyperlipidemia Mother   . Heart disease Father   . Colon cancer Neg Hx   . Colon polyps Neg Hx   . Esophageal cancer Neg Hx   . Rectal cancer Neg Hx   . Stomach cancer Neg Hx     Current Outpatient Medications  Medication Sig Dispense Refill  . amitriptyline (ELAVIL) 50 MG tablet Take 50 mg by mouth at bedtime.    Marland Kitchen atorvastatin (LIPITOR) 40 MG tablet TAKE 1 TABLET BY MOUTH DAILY AT 6 PM. 30 tablet 5  . buprenorphine (SUBUTEX) 8 MG SUBL SL tablet Place under the tongue daily. 3 tabs daily    . carisoprodol (SOMA) 350 MG tablet     . cloNIDine (CATAPRES) 0.1 MG tablet TK 1 T PO TID    . Dulaglutide (TRULICITY) 2.77 OE/4.2PN SOPN Inject 0.5 mLs (0.75 mg total) into the skin once a week. 2 mL 2  . fenofibrate (TRICOR) 145 MG tablet Take 1 tablet (145 mg total) by mouth daily. 30 tablet 5  . furosemide (LASIX) 20 MG tablet Take 1-2 tablets (20-40 mg total) by mouth daily. 10 tablet 0  .  hydrochlorothiazide (HYDRODIURIL) 25 MG tablet Take 1 tablet (25 mg total) by mouth daily. [place]  tablet 5  . Lancets (FREESTYLE) lancets Use as instructed    . lansoprazole (PREVACID) 15 MG capsule Take 15 mg daily as needed by mouth (for heartburn).     . losartan (COZAAR) 100 MG tablet Take 1 tablet (100 mg total) by mouth daily. 30 tablet 5  . metFORMIN (GLUCOPHAGE-XR) 500 MG 24 hr tablet Take 2 tablets (1,000 mg total) by mouth 2 (two) times daily. 120 tablet 5  . NUCYNTA ER 250 MG TB12 SMARTSIG:1 Tablet(s) By Mouth Every 12 Hours    . omeprazole (PRILOSEC) 40 MG capsule Take 40 mg by mouth daily.    . potassium chloride (K-DUR) 10 MEQ tablet Take 1 tablet (10 mEq total) by mouth daily. With use of lasix/furosemide 30 tablet 0  . prazosin (MINIPRESS) 2 MG capsule Take 2 mg by mouth at bedtime.    . pregabalin (LYRICA) 300 MG capsule Take 300 mg by mouth daily.    . propranolol (INDERAL) 10 MG tablet TK 1 T PO TID    . QUEtiapine (SEROQUEL) 300 MG tablet Take 150 mg by mouth at bedtime.    . sertraline (ZOLOFT) 100 MG tablet Take 100 mg by mouth daily.    . Syringe/Needle, Disp, 30G X 1/2" 1 ML MISC UAD - use as directed 100 each 5  . traMADol (ULTRAM) 50 MG tablet TK 2 TS PO QID     No current facility-administered medications for this visit.    Allergies  Allergen Reactions  . Heparin Other (See Comments)    HIT Ab negative on 02/20/15, but SRA POSITIVE   . Oxytetracycline Rash and Other (See Comments)  . Sulfa Antibiotics Other (See Comments)  . Del-Mycin [Erythromycin]     All mycin drugs  . Other     Mycins  . Sulfa Antibiotics   . Heparin Rash  . Sulfonamide Derivatives Rash    REVIEW OF SYSTEMS:   [X]  denotes positive finding, [ ]  denotes negative finding Cardiac  Comments:  Chest pain or chest pressure:    Shortness of breath upon exertion:    Short of breath when lying flat:    Irregular heart rhythm:        Vascular    Pain in calf, thigh, or hip brought on by  ambulation:    Pain in feet at night that wakes you up from your sleep:     Blood clot in your veins:    Leg swelling:  x       Pulmonary    Oxygen at home:    Productive cough:     Wheezing:         Neurologic    Sudden weakness in arms or legs:     Sudden numbness in arms or legs:     Sudden onset of difficulty speaking or slurred speech:    Temporary loss of vision in one eye:     Problems with dizziness:         Gastrointestinal    Blood in stool:     Vomited blood:         Genitourinary    Burning when urinating:     Blood in urine:        Psychiatric    Major depression:  x bipolar      Hematologic    Bleeding problems:    Problems with blood clotting too easily:        Skin    Rashes or ulcers: x  Constitutional    Fever or chills:      PHYSICAL EXAMINATION:  Today's Vitals   09/26/19 1335  BP: 113/68  Pulse: 95  Resp: 20  Temp: 98.6 F (37 C)  TempSrc: Temporal  SpO2: 97%  Weight: 248 lb 12.8 oz (112.9 kg)  Height: 6\' 2"  (1.88 m)  PainSc: 0-No pain   Body mass index is 31.94 kg/m.   General:  WDWN in NAD; vital signs documented above Gait: Not observed HENT: WNL, normocephalic Pulmonary: normal non-labored breathing without wheezing Cardiac: regular HR; without carotid bruits Abdomen: soft, NT, no masses; aortic pulse is not palpable Skin: without rashes Vascular Exam/Pulses:  Right Left  Radial 2+ (normal) 2+ (normal)  Popliteal Unable to palpate Unable to palpate  DP 2+ (normal) 2+ (normal)  PT Unable to palpate Unable to palpate   Extremities: Hemosiderin staining BLE; he has a couple of areas on the right leg that have healed and scabbed.  There is one small area on each leg with minimal serous drainage.       Musculoskeletal: no muscle wasting or atrophy  Neurologic: A&O X 3;  moving all extremities equally Psychiatric:  The pt has Normal affect.   Non-Invasive Vascular Imaging:   Venous duplex on  09/26/2019: Venous Reflux Times  +--------------+---------+------+-----------+------------+-----------------  ----+  RIGHT     Reflux NoRefluxReflux TimeDiameter cmsComments                      Yes                       +--------------+---------+------+-----------+------------+-----------------  CFV            yes  >1 second                 +--------------+---------+------+-----------+------------+-----------------  FV mid    no                             +--------------+---------+------+-----------+------------+-----------------  Popliteal   no                             +--------------+---------+------+-----------+------------+-----------------  GSV at SFJ        yes  >500 ms   0.6             +--------------+---------+------+-----------+------------+-----------------  GSV prox thighno               0.37            +--------------+---------+------+-----------+------------+-----------------  GSV mid thigh no               0.37            +--------------+---------+------+-----------+------------+-----------------  GSV dist thighno               0.46            +--------------+---------+------+-----------+------------+-----------------  GSV at knee  no               0.55            +--------------+---------+------+-----------+------------+-----------------  GSV prox calf no               0.51            +--------------+---------+------+-----------+------------+-----------------  SSV Pop Fossa       yes  >500 ms   0.23  SSV does not  join into popliteal but  continues up thigh     +--------------+---------+------+-----------+------------+-----------------  SSV prox calf       yes  >500 ms   0.38            +--------------+---------+------+-----------+------------+-----------------  SSV mid calf no               0.22            +--------------+---------+------+-----------+------------+-----------------  ----+     +--------------+---------+------+-----------+------------+-----------------  ----+  LEFT     Reflux NoRefluxReflux TimeDiameter cmsComments                    Yes                       +--------------+---------+------+-----------+------------+-----------------  CFV            yes  >1 second                 +--------------+---------+------+-----------+------------+-----------------  FV mid    no                             +--------------+---------+------+-----------+------------+-----------------   Popliteal   no                             +--------------+---------+------+-----------+------------+-----------------  GSV at SFJ        yes  >500 ms   0.69            +--------------+---------+------+-----------+------------+-----------------  GSV prox thigh      yes  >500 ms   0.62            +--------------+---------+------+-----------+------------+-----------------   GSV mid thigh       yes  >500 ms   0.42            +--------------+---------+------+-----------+------------+-----------------  GSV dist thigh      yes  >500 ms   0.36            +--------------+---------+------+-----------+------------+-----------------  GSV at knee  no               0.47             +--------------+---------+------+-----------+------------+-----------------  GSV prox calf       yes  >500 ms   0.49            +--------------+---------+------+-----------+------------+-----------------  SSV Pop Fossa       yes  >500 ms   0.53  SSV joins into the popliteal and continues up thigh    +--------------+---------+------+-----------+------------+-----------------  SSV prox calf no               0.23            +--------------+---------+------+-----------+------------+-----------------  SSV mid calf no               0.25            +--------------+---------+------+-----------+------------+-----------------  - Summary:  Right:  - No evidence of deep vein thrombosis seen in the right lower extremity,  from the common femoral through the popliteal veins.  - No evidence of superficial venous thrombosis in the right lower  extremity.  - Venous reflux is noted in the right common femoral vein.  - Venous reflux is noted in the right sapheno-femoral junction.  - Venous reflux is noted in the right short saphenous vein.    Left:  - No evidence of deep vein thrombosis seen  in the left lower extremity,  from the common femoral through the popliteal veins.  - No evidence of superficial venous thrombosis in the left lower  extremity.  - Venous reflux is noted in the left common femoral vein.  - Venous reflux is noted in the left sapheno-femoral junction.  - Venous reflux is noted in the left greater saphenous vein in the thigh.  - Venous reflux is noted in the left greater saphenous vein in the calf.  - Venous reflux is noted in the left short saphenous vein.    [name] [name] is a 59 y.o. male who presents with: BLE edema  -pt has palpable DP pulses bilaterally. -Pt's duplex does not reveal any evidence of DVT.  It does reveal that he has significant reflux in the left GSV  and most likely will be a candidate for laser ablation on the left.   -he does have one small area on each leg that is weeping.  Discussed that this could improve with compression and elevation if we can get his swelling improved.  Discussed with him if they worsen, he may need unna boot and will call us back sooner if these do not improve.  -discussed with pt about wearing thigh high 20-46mmHg compression stockings and elevating legs.   He has not been elevating his legs properly and handout and brochure given to pt about this.  He was also measured for appropriate compression today.   -discussed importance of weight loss -pt will f/u 3 months with either Dr. Scot Dock or Dr. Oneida Alar to discuss laser ablation.    Leontine Locket, [facility] V[facility] Vein Specialists 09/23/2019 12:57 PM  Clinic MD:  Trula Slade

## 2019-09-26 ENCOUNTER — Other Ambulatory Visit: Payer: Self-pay

## 2019-09-26 ENCOUNTER — Ambulatory Visit (HOSPITAL_COMMUNITY)
Admission: RE | Admit: 2019-09-26 | Discharge: 2019-09-26 | Disposition: A | Discharge status: Home / Self Care | Payer: Medicare Other | Source: Ambulatory Visit | Attending: Surgery | Admitting: Surgery

## 2019-09-26 ENCOUNTER — Ambulatory Visit: Payer: Medicare Other | Admitting: Physician Assistant

## 2019-09-26 VITALS — BP 113/68 | HR 95 | Temp 98.6°F | Resp 20 | Ht 74.0 in | Wt 248.8 lb

## 2019-09-26 DIAGNOSIS — M7989 Other specified soft tissue disorders: Secondary | ICD-10-CM | POA: Diagnosis not present

## 2019-09-26 DIAGNOSIS — I872 Venous insufficiency (chronic) (peripheral): Secondary | ICD-10-CM | POA: Insufficient documentation

## 2019-09-29 ENCOUNTER — Encounter: Payer: Self-pay | Admitting: Gastroenterology

## 2019-09-29 DIAGNOSIS — Z03818 Encounter for observation for suspected exposure to other biological agents ruled out: Secondary | ICD-10-CM | POA: Diagnosis not present

## 2019-10-04 DIAGNOSIS — Z03818 Encounter for observation for suspected exposure to other biological agents ruled out: Secondary | ICD-10-CM | POA: Diagnosis not present

## 2019-10-05 MED FILL — metFORMIN HCL ER 500 MG TB2: 500 | 30 days supply | Qty: 120 | Fill #2

## 2019-10-13 DIAGNOSIS — Z03818 Encounter for observation for suspected exposure to other biological agents ruled out: Secondary | ICD-10-CM | POA: Diagnosis not present

## 2019-10-20 DIAGNOSIS — Z03818 Encounter for observation for suspected exposure to other biological agents ruled out: Secondary | ICD-10-CM | POA: Diagnosis not present

## 2019-10-26 ENCOUNTER — Ambulatory Visit: Payer: Medicare Other | Admitting: Internal Medicine

## 2019-10-31 DIAGNOSIS — Z03818 Encounter for observation for suspected exposure to other biological agents ruled out: Secondary | ICD-10-CM | POA: Diagnosis not present

## 2019-11-03 DIAGNOSIS — Z03818 Encounter for observation for suspected exposure to other biological agents ruled out: Secondary | ICD-10-CM | POA: Diagnosis not present

## 2019-11-08 MED FILL — LOSARTAN POTASSIUM 100 MG T: 100 | 30 days supply | Qty: 30 | Fill #2

## 2019-11-08 MED FILL — metFORMIN HCL ER 500 MG TB2: 500 | 30 days supply | Qty: 120 | Fill #3

## 2019-11-08 MED FILL — ATORVASTATIN CALCIUM 40 MG: 40 | 30 days supply | Qty: 30 | Fill #2

## 2019-11-08 MED FILL — FENOFIBRATE 145 MG TABLET: 145 | 30 days supply | Qty: 30 | Fill #2

## 2019-11-09 ENCOUNTER — Telehealth (INDEPENDENT_AMBULATORY_CARE_PROVIDER_SITE_OTHER): Payer: Medicare Other | Admitting: Physician Assistant

## 2019-11-09 ENCOUNTER — Ambulatory Visit: Payer: Medicare Other

## 2019-11-09 ENCOUNTER — Other Ambulatory Visit: Payer: Self-pay

## 2019-11-09 DIAGNOSIS — D696 Thrombocytopenia, unspecified: Secondary | ICD-10-CM | POA: Diagnosis not present

## 2019-11-09 DIAGNOSIS — Z794 Long term (current) use of insulin: Secondary | ICD-10-CM | POA: Diagnosis not present

## 2019-11-09 DIAGNOSIS — E782 Mixed hyperlipidemia: Secondary | ICD-10-CM

## 2019-11-09 DIAGNOSIS — E1142 Type 2 diabetes mellitus with diabetic polyneuropathy: Secondary | ICD-10-CM

## 2019-11-09 DIAGNOSIS — I10 Essential (primary) hypertension: Secondary | ICD-10-CM

## 2019-11-09 MED ORDER — METFORMIN HCL ER 500 MG PO TB24: 1000.0000 mg | ORAL_TABLET | Freq: Two times a day (BID) | ORAL | 5 refills | Status: DC

## 2019-11-09 MED ORDER — TRULICITY 0.75 MG/0.5ML ~~LOC~~ SOAJ: 0.7500 mg | SUBCUTANEOUS | 2 refills | Status: DC

## 2019-11-09 MED ORDER — HYDROCHLOROTHIAZIDE 25 MG PO TABS: 25.0000 mg | ORAL_TABLET | Freq: Every day | ORAL | 5 refills | Status: DC

## 2019-11-09 MED ORDER — FENOFIBRATE 145 MG PO TABS: 145.0000 mg | ORAL_TABLET | Freq: Every day | ORAL | 5 refills | Status: DC

## 2019-11-09 MED ORDER — ATORVASTATIN CALCIUM 40 MG PO TABS: ORAL_TABLET | ORAL | 5 refills | Status: DC

## 2019-11-09 MED ORDER — LOSARTAN POTASSIUM 100 MG PO TABS: 100.0000 mg | ORAL_TABLET | Freq: Every day | ORAL | 5 refills | Status: DC

## 2019-11-09 MED FILL — HYDROCHLOROTHIAZIDE 25 MG T: 25 | 30 days supply | Qty: 30 | Fill #0

## 2019-11-09 NOTE — Progress Notes (Signed)
  Virtual Visit via Telephone Note  I connected with [name] [name] on 11/09/19 at  3:10 PM EDT by telephone and verified that I am speaking with the correct person using two identifiers.  Location: Patient: [name] [name] Provider: Freeman Caldron, PA-C   I discussed the limitations, risks, security and privacy concerns of performing an evaluation and management service by telephone and the availability of in person appointments. I also discussed with the patient that there may be a patient responsible charge related to this service. The patient expressed understanding and agreed to proceed.  PATIENT visit by telephone virtually in the context of Covid-19 pandemic. Patient location:  home My Location:  [place] office Persons on the call:  Me and the patient   History of Present Illness:  Patient for DM and htn f/up.  Says blood sugars always >200 when he checks them.  He only just got trulicity this week bc of cost and had to apply for help to get it.  So, he has only been taking metformin 1000mg  bid at the time of this vist and will only have had 1 dose trulicity at the time of his labs next week.  No other issues or concerns.  He does not check his BP OOO.  No hypoglycemia.  Sees pain management for psych/pain meds   Observations/Objective:  NAD.  A&Ox3   Assessment and Plan:   1. Type 2 diabetes mellitus with diabetic polyneuropathy, with long-term current use of insulin (HCC) Uncontrolled-just now starting trulicity this week so C5E next week will NOT be a true reflection of how he will/should improve - Hemoglobin A1c; Future - Dulaglutide (TRULICITY) 5.27 PO/2.4MP SOPN; Inject 0.75 mg into the skin once a week.  Dispense: 2 mL; Refill: 2 - metFORMIN (GLUCOPHAGE-XR) 500 MG 24 hr tablet; Take 2 tablets (1,000 mg total) by mouth 2 (two) times daily.  Dispense: 120 tablet; Refill: 5 - Comprehensive metabolic panel; Future  2. Essential hypertension - hydrochlorothiazide  (HYDRODIURIL) 25 MG tablet; Take 1 tablet (25 mg total) by mouth daily.  Dispense: 30 tablet; Refill: 5 - losartan (COZAAR) 100 MG tablet; Take 1 tablet (100 mg total) by mouth daily.  Dispense: 30 tablet; Refill: 5 - Comprehensive metabolic panel; Future  3. Mixed hyperlipidemia - atorvastatin (LIPITOR) 40 MG tablet; TAKE 1 TABLET BY MOUTH DAILY AT 6 PM.  Dispense: 30 tablet; Refill: 5 - fenofibrate (TRICOR) 145 MG tablet; Take 1 tablet (145 mg total) by mouth daily.  Dispense: 30 tablet; Refill: 5 - Lipid panel; Future  4. Thrombocytopenia (HCC) - CBC with Differential/Platelet; Future    Follow Up Instructions: Check blood sugars bid and see Luke in 3-4 weeks for blood sugar and BP check and see PCP in 3 months.   I discussed the assessment and treatment plan with the patient. The patient was provided an opportunity to ask questions and all were answered. The patient agreed with the plan and demonstrated an understanding of the instructions.   The patient was advised to call back or seek an in-person evaluation if the symptoms worsen or if the condition fails to improve as anticipated.  I provided 15 minutes of non-face-to-face time during this encounter.   Freeman Caldron, PA-C

## 2019-11-10 DIAGNOSIS — Z03818 Encounter for observation for suspected exposure to other biological agents ruled out: Secondary | ICD-10-CM | POA: Diagnosis not present

## 2019-11-15 DIAGNOSIS — Z03818 Encounter for observation for suspected exposure to other biological agents ruled out: Secondary | ICD-10-CM | POA: Diagnosis not present

## 2019-11-17 ENCOUNTER — Other Ambulatory Visit: Payer: Medicare Other

## 2019-11-22 DIAGNOSIS — Z03818 Encounter for observation for suspected exposure to other biological agents ruled out: Secondary | ICD-10-CM | POA: Diagnosis not present

## 2019-11-29 NOTE — Progress Notes (Signed)
S:     PCP: Dr. Juleen China PMH: HTN, LVH, GERD, T2DM, depression/anxiety  Patient arrives in good spirits.  Presents for diabetes and hypertension evaluation, education, and management. Patient was referred by Levada Dy on 11/09/19 and last seen by Primary Care Provider on 07/25/19. At visit on 11/09/19, patient reported home sugars consistently >200 while taking metformin 1000 mg BID. He reported not starting his Trulicity. Additionally, reported not checking BP at home.   Patient reports Diabetes was diagnosed in ~2010.   Today, patient reports medication adherence with metformin twice daily. Reports not starting Trulicity - "planned to start but has not followed through yet". Patient reports not checking BG because he "knows" it's high due to diet indiscretions. Reports having a sweet tooth and not having the best diet (see below). Denies nocturia and symptomatic hypoglycemia. Denies GI upset and history of thyroid cancer. Additionally, reports medication adherence with losartan and HCTZ daily in the mornings. Reports not checking BP at home. Denies dizziness, headaches, and blurred vision. Reports no additional LE swelling.  Family/Social History: -Fhx: mother with diabetes and hyperlipidemia; father with heart disease -Tobacco use: denies  Insurance coverage/medication affordability: [employer]  Medication adherence reported good with metformin, however has not started [employer].   Current diabetes medications include: Trulicity 8.50 mg weekly (not taking), metformin 1000 mg BID Current hypertension medications include: losartan 100 mg daily, HCTZ 25 mg daily Current hyperlipidemia medications include: atorvastatin 40 mg daily, fenofibrate 145 mg daily  Previously tried antihypertensives: amlodipine (LE swelling)  Patient denies hypoglycemic events.  Patient reported dietary habits: Breakfast: skips, peach parfait  Lunch: McChicken sandwich from [employer], fast food, french fries  via air fyer, corn, broccoli, snow pies  Dinner: pork chops, soup from [employer], slice of bread occasionally Snacks: debbie [employer] cakes Drinks: coke zero  Patient-reported exercise habits: walking when doing errands   Patient denies nocturia (nighttime urination).  Patient reports neuropathy (nerve pain). Patient denies visual changes. Patient reports self foot exams.     O:  POCT: 309 (post-prandial)  Home fasting blood sugars: not checking  2 hour post-meal/random blood sugars: not checking   Lab Results  Component Value Date   HGBA1C 9.2 (A) 07/25/2019   Vitals:   11/30/19 1106  BP: 107/69  Pulse: 61    Lipid Panel     Component Value Date/Time   CHOL 105 04/01/2019 1025   TRIG 202 (H) 04/01/2019 1025   HDL 30 (L) 04/01/2019 1025   CHOLHDL 3.5 04/01/2019 1025   CHOLHDL 5.5 (H) 06/01/2015 1104   VLDL 53 (H) 06/01/2015 1104   LDLCALC 42 04/01/2019 1025   Clinical Atherosclerotic Cardiovascular Disease (ASCVD): No  The ASCVD Risk score Mikey Bussing DC Jr., et al., 2013) failed to calculate for the following reasons:   The valid total cholesterol range is 130 to 320 mg/dL    A/P: Diabetes longstanding currently uncontrolled with A1C of 9.2%. Unable to assess home BGs, however POCT BG elevated at 309 (post-prandial). Medication adherence appears optimal with metformin, however control is suboptimal due to not starting Trulicity. Instructed patient to start Trulicity 2.77 mg once weekly today. Patient verbalized agreement. Counseled on clinical benefits, potential side effects, and demonstrated proper injection technique with teach-back method. Extensively discussed importance of medication adherence and not missing any doses. Discussed long-term complications of uncontrolled diabetes. Encouraged patient to check sugars at least twice daily - morning fasting and 1-2 hours after biggest meal. Patient verbalized understanding. Encouraged patient to reduce sweet  intake and aim for a diet full of vegetables, fruit and lean meats (chicken, [place], fish) and to limit sugar intake. Encouraged patient to walk for 20-30 minutes daily with the goal of 150 minutes per week. Patient verbalized understanding. -Continued Trulicity 0.73 mg weekly  -Continued metformin 1000 mg BID  -Labs today - A1C and CMET -Extensively discussed pathophysiology of diabetes, recommended lifestyle interventions, dietary effects on blood sugar control -Counseled on s/sx of and management of hypoglycemia -Next A1C anticipated February 2021.   ASCVD risk - primary prevention in patient with diabetes. Last LDL is controlled. Moderate intensity statin indicated.  -Continued atorvastatin 40 mg daily -Continued fenofibrate 160 mg daily -Labs today - Lipid panel and LFTs  Hypertension longstanding currently controlled.  Blood pressure goal = <130/80 mmHg. Medication adherence appears optimal. Encouraged patient to check BP at least 1 hour after taking medications.   -Continued losartan 100 mg daily -Continued HCTZ 25 mg daily  Written patient instructions provided.  Total time in face to face counseling 30 minutes.   Follow up Pharmacist Clinic Visit in 2 weeks.     Lorel Monaco, PharmD PGY2 Ambulatory Care Resident [place]

## 2019-11-30 ENCOUNTER — Other Ambulatory Visit: Payer: Self-pay

## 2019-11-30 ENCOUNTER — Encounter: Payer: Medicare Other | Admitting: Gastroenterology

## 2019-11-30 ENCOUNTER — Encounter: Payer: Self-pay | Admitting: Pharmacist

## 2019-11-30 ENCOUNTER — Ambulatory Visit: Payer: Medicare Other

## 2019-11-30 ENCOUNTER — Ambulatory Visit: Payer: Medicare Other | Attending: Internal Medicine | Admitting: Pharmacist

## 2019-11-30 VITALS — BP 107/69 | HR 61

## 2019-11-30 DIAGNOSIS — I10 Essential (primary) hypertension: Secondary | ICD-10-CM | POA: Diagnosis not present

## 2019-11-30 DIAGNOSIS — D696 Thrombocytopenia, unspecified: Secondary | ICD-10-CM

## 2019-11-30 DIAGNOSIS — Z794 Long term (current) use of insulin: Secondary | ICD-10-CM

## 2019-11-30 DIAGNOSIS — E782 Mixed hyperlipidemia: Secondary | ICD-10-CM | POA: Diagnosis not present

## 2019-11-30 DIAGNOSIS — E1142 Type 2 diabetes mellitus with diabetic polyneuropathy: Secondary | ICD-10-CM | POA: Diagnosis not present

## 2019-11-30 LAB — GLUCOSE, POCT (MANUAL RESULT ENTRY): POC Glucose: 309 mg/dl — AB (ref 70–99)

## 2019-12-01 DIAGNOSIS — Z03818 Encounter for observation for suspected exposure to other biological agents ruled out: Secondary | ICD-10-CM | POA: Diagnosis not present

## 2019-12-01 LAB — HEMOGLOBIN A1C
Est. average glucose Bld gHb Est-mCnc: 226 mg/dL
Hgb A1c MFr Bld: 9.5 % — ABNORMAL HIGH (ref 4.8–5.6)

## 2019-12-01 LAB — COMPREHENSIVE METABOLIC PANEL
ALT: 29 IU/L (ref 0–44)
AST: 26 IU/L (ref 0–40)
Albumin/Globulin Ratio: 1.8 (ref 1.2–2.2)
Albumin: 4.3 g/dL (ref 3.8–4.9)
Alkaline Phosphatase: 54 IU/L (ref 44–121)
BUN/Creatinine Ratio: 16 (ref 9–20)
BUN: 16 mg/dL (ref 6–24)
Bilirubin Total: 0.4 mg/dL (ref 0.0–1.2)
CO2: 21 mmol/L (ref 20–29)
Calcium: 8.8 mg/dL (ref 8.7–10.2)
Chloride: 98 mmol/L (ref 96–106)
Creatinine, Ser: 1.03 mg/dL (ref 0.76–1.27)
GFR calc Af Amer: 92 mL/min/{1.73_m2} (ref >59)
GFR calc non Af Amer: 80 mL/min/{1.73_m2} (ref >59)
Globulin, Total: 2.4 g/dL (ref 1.5–4.5)
Glucose: 296 mg/dL — ABNORMAL HIGH (ref 65–99)
Potassium: 3.8 mmol/L (ref 3.5–5.2)
Sodium: 138 mmol/L (ref 134–144)
Total Protein: 6.7 g/dL (ref 6.0–8.5)

## 2019-12-01 LAB — CBC WITH DIFFERENTIAL/PLATELET
Basophils Absolute: 0 10*3/uL (ref 0.0–0.2)
Basos: 1 %
EOS (ABSOLUTE): 0.1 10*3/uL (ref 0.0–0.4)
Eos: 4 %
Hematocrit: 34.6 % — ABNORMAL LOW (ref 37.5–51.0)
Hemoglobin: 11.9 g/dL — ABNORMAL LOW (ref 13.0–17.7)
Immature Grans (Abs): 0 10*3/uL (ref 0.0–0.1)
Immature Granulocytes: 0 %
Lymphocytes Absolute: 1.1 10*3/uL (ref 0.7–3.1)
Lymphs: 32 %
MCH: 26.9 pg (ref 26.6–33.0)
MCHC: 34.4 g/dL (ref 31.5–35.7)
MCV: 78 fL — ABNORMAL LOW (ref 79–97)
Monocytes Absolute: 0.2 10*3/uL (ref 0.1–0.9)
Monocytes: 6 %
Neutrophils Absolute: 1.9 10*3/uL (ref 1.4–7.0)
Neutrophils: 57 %
Platelets: 117 10*3/uL — ABNORMAL LOW (ref 150–450)
RBC: 4.43 x10E6/uL (ref 4.14–5.80)
RDW: 13.7 % (ref 11.6–15.4)
WBC: 3.4 10*3/uL (ref 3.4–10.8)

## 2019-12-01 LAB — LIPID PANEL
Chol/HDL Ratio: 3.8 ratio (ref 0.0–5.0)
Cholesterol, Total: 126 mg/dL (ref 100–199)
HDL: 33 mg/dL — ABNORMAL LOW (ref >39)
LDL Chol Calc (NIH): 60 mg/dL (ref 0–99)
Triglycerides: 197 mg/dL — ABNORMAL HIGH (ref 0–149)
VLDL Cholesterol Cal: 33 mg/dL (ref 5–40)

## 2019-12-07 ENCOUNTER — Encounter: Payer: Self-pay | Admitting: Podiatry

## 2019-12-07 ENCOUNTER — Ambulatory Visit (INDEPENDENT_AMBULATORY_CARE_PROVIDER_SITE_OTHER): Payer: Medicare Other | Admitting: Podiatry

## 2019-12-07 ENCOUNTER — Ambulatory Visit: Payer: Medicare Other | Admitting: Podiatry

## 2019-12-07 ENCOUNTER — Other Ambulatory Visit: Payer: Self-pay

## 2019-12-07 DIAGNOSIS — M79675 Pain in left toe(s): Secondary | ICD-10-CM

## 2019-12-07 DIAGNOSIS — E1142 Type 2 diabetes mellitus with diabetic polyneuropathy: Secondary | ICD-10-CM

## 2019-12-07 DIAGNOSIS — M79674 Pain in right toe(s): Secondary | ICD-10-CM

## 2019-12-07 DIAGNOSIS — B351 Tinea unguium: Secondary | ICD-10-CM

## 2019-12-07 DIAGNOSIS — L989 Disorder of the skin and subcutaneous tissue, unspecified: Secondary | ICD-10-CM | POA: Insufficient documentation

## 2019-12-07 NOTE — Progress Notes (Signed)
Complaint:  Visit Type: Patient returns to my office for continued preventative foot care services. Complaint: Patient states" his nails have grown thick and long are painful walking and wearing his shoes.  Patient is diabetic with neuropathy.  Patient presents for preventative foot care services..  Podiatric Exam: Vascular: dorsalis pedis and posterior tibial pulses are palpable bilateral. Capillary return is immediate. Temperature gradient is WNL. Skin turgor WNL  Sensorium: Diminished Semmes Weinstein monofilament test. Normal tactile sensation bilaterally. Nail Exam: Pt has thick disfigured discolored nails with subungual debris noted bilateral entire nail hallux through fifth toenails Ulcer Exam: There is no evidence of ulcer hallux  B/L. Orthopedic Exam: Muscle tone and strength are WNL. No limitations in general ROM. No crepitus or effusions noted. Foot type and digits show no abnormalities. Bony prominences are unremarkable. Mild swelling noted.  HAV right foot. Skin: No Porokeratosis. No infection or ulcers.  Pinch callus right hallux with associated skin separation   Diagnosis:  Onychomycosis, , Pain in right toe, pain in left toes,   Treatment & Plan Procedures and Treatment: Consent by patient was obtained for treatment procedures. The patient understood the discussion of treatment and procedures well. All questions were answered thoroughly reviewed. Debridement of mycotic and hypertrophic toenails, 1 through 5 bilateral and clearing of subungual debris. No ulceration, no infection noted.   Return Visit-Office Procedure: Patient instructed to return to the office for a follow up visit  4 months .for continued evaluation and treatment.    Gardiner Barefoot DPM

## 2019-12-08 DIAGNOSIS — Z03818 Encounter for observation for suspected exposure to other biological agents ruled out: Secondary | ICD-10-CM | POA: Diagnosis not present

## 2019-12-12 DIAGNOSIS — Z03818 Encounter for observation for suspected exposure to other biological agents ruled out: Secondary | ICD-10-CM | POA: Diagnosis not present

## 2019-12-13 NOTE — Progress Notes (Unsigned)
S:     PCP: Dr. Juleen China PMH: HTN, LVH, GERD, T2DM, depression/anxiety  Patient arrives in good spirits.  Presents for diabetes and hypertension evaluation, education, and management. Patient was referred by Levada Dy on 11/09/19 and last seen by Primary Care Provider on 07/25/19. Last seen by clinical pharmacist on 67/59/16 at which Trulicity 3.84 mg once weekly was restarted.  Patient reports Diabetes was diagnosed in ~2010.   Last visit -pt had not started Trulicity yet, not checking BG and BP    BP 107/69 at goal.  A1C = Check Clinic BG Review medications and adherence (timing of meds, etc.)  Ate or drank anything prior to visit today? At home BGs?  Marland Kitchen Highs . Lows  Hyperglycemia sx (nocturia, neuropathy, visual changes, foot exams) Hypoglycemia symptoms (dizziness, shaky, sweating, hungry, confusion) Diet Exercise  Plan - should be on week 3 of Trulicity as of today   Compliance? Took meds this morning? When do you take your meds? Dizziness, headaches, blurred vision? History of swelling? Check Clinic BP? Home BP logs? If no logs, bring to next visit w/ BP cuff Go over BP goals Additional BP therapy if needed .  Diet??  Exercise??    Family/Social History: -Fhx: mother with diabetes and hyperlipidemia; father with heart disease -Tobacco use: denies  Insurance coverage/medication affordability: [employer]  Medication adherence reported *** .   Current diabetes medications include: Trulicity 6.65 mg weekly, metformin 1000 mg BID Current hypertension medications include: losartan 100 mg daily, HCTZ 25 mg daily Current hyperlipidemia medications include: atorvastatin 40 mg daily, fenofibrate 145 mg daily  Previously tried antihypertensives: amlodipine (LE swelling)  Patient {Actions; denies-reports:120008} hypoglycemic events.  Patient reported dietary habits: Breakfast: skips, peach parfait  Lunch: McChicken sandwich from [employer], fast food, french  fries via [employer], corn, broccoli, snow pies  Dinner: pork chops, soup from [employer], slice of bread occasionally Snacks: debbie [employer] cakes Drinks: coke zero  Patient-reported exercise habits: walking when doing errands   Patient {Actions; denies-reports:120008} nocturia (nighttime urination).  Patient {Actions; denies-reports:120008} neuropathy (nerve pain). Patient {Actions; denies-reports:120008} visual changes. Patient {Actions; denies-reports:120008} self foot exams.     O:  POCT:  Home fasting blood sugars: ***  2 hour post-meal/random blood sugars: ***.  Lab Results  Component Value Date   HGBA1C 9.5 (H) 11/30/2019   There were no vitals filed for this visit.  Lipid Panel     Component Value Date/Time   CHOL 126 11/30/2019 1122   TRIG 197 (H) 11/30/2019 1122   HDL 33 (L) 11/30/2019 1122   CHOLHDL 3.8 11/30/2019 1122   CHOLHDL 5.5 (H) 06/01/2015 1104   VLDL 53 (H) 06/01/2015 1104   LDLCALC 60 11/30/2019 1122     Clinical Atherosclerotic Cardiovascular Disease (ASCVD): No  The ASCVD Risk score Mikey Bussing DC Jr., et al., 2013) failed to calculate for the following reasons:   The valid total cholesterol range is 130 to 320 mg/dL    A/P: Diabetes longstanding*** currently ***. Patient is *** able to verbalize appropriate hypoglycemia management plan. Medication adherence appears ***. Control is suboptimal due to ***.  -Extensively discussed pathophysiology of diabetes, recommended lifestyle interventions, dietary effects on blood sugar control -Counseled on s/sx of and management of hypoglycemia -Next A1C anticipated February 2022.   ASCVD risk - primary prevention in patient with diabetes. Last LDL is controlled. Moderate intensity statin indicated.  -Continued atorvastatin 40 mg daily -Continued fenofibrate 160 mg daily   Hypertension longstanding currently controlled.  Blood pressure goal = <130/80 mmHg. Medication adherence appears optimal.  Encouraged patient to check BP at least 1 hour after taking medications.   -Continued losartan 100 mg daily -Continued HCTZ 25 mg daily  Written patient instructions provided.  Total time in face to face counseling *** minutes.   Follow up Pharmacist/PCP*** Clinic Visit in ***.    Lorel Monaco, PharmD, [place] PGY2 Ambulatory Care Resident [place]

## 2019-12-14 ENCOUNTER — Ambulatory Visit: Payer: Medicare Other | Admitting: Pharmacist

## 2019-12-24 ENCOUNTER — Other Ambulatory Visit: Payer: Self-pay

## 2019-12-24 ENCOUNTER — Ambulatory Visit
Admission: EM | Admit: 2019-12-24 | Discharge: 2019-12-24 | Disposition: A | Discharge status: Home / Self Care | Payer: Medicare Other | Attending: Emergency Medicine | Admitting: Emergency Medicine

## 2019-12-24 ENCOUNTER — Encounter: Payer: Self-pay | Admitting: Emergency Medicine

## 2019-12-24 ENCOUNTER — Ambulatory Visit (INDEPENDENT_AMBULATORY_CARE_PROVIDER_SITE_OTHER): Payer: Medicare Other

## 2019-12-24 DIAGNOSIS — M79672 Pain in left foot: Secondary | ICD-10-CM

## 2019-12-24 DIAGNOSIS — L089 Local infection of the skin and subcutaneous tissue, unspecified: Secondary | ICD-10-CM

## 2019-12-24 DIAGNOSIS — T148XXA Other injury of unspecified body region, initial encounter: Secondary | ICD-10-CM

## 2019-12-24 DIAGNOSIS — R2242 Localized swelling, mass and lump, left lower limb: Secondary | ICD-10-CM | POA: Diagnosis not present

## 2019-12-24 DIAGNOSIS — Z23 Encounter for immunization: Secondary | ICD-10-CM

## 2019-12-24 DIAGNOSIS — S91312A Laceration without foreign body, left foot, initial encounter: Secondary | ICD-10-CM | POA: Diagnosis not present

## 2019-12-24 DIAGNOSIS — M7989 Other specified soft tissue disorders: Secondary | ICD-10-CM | POA: Diagnosis not present

## 2019-12-24 MED ORDER — CLINDAMYCIN HCL 300 MG PO CAPS: 300.0000 mg | ORAL_CAPSULE | Freq: Three times a day (TID) | ORAL | 0 refills | Status: DC

## 2019-12-24 MED ORDER — TETANUS-DIPHTH-ACELL PERTUSSIS 5-2.5-18.5 LF-MCG/0.5 IM SUSY
0.5000 mL | PREFILLED_SYRINGE | Freq: Once | INTRAMUSCULAR | Status: AC
  Administered 2019-12-24: 0.5 mL via INTRAMUSCULAR

## 2019-12-24 MED ORDER — CEPHALEXIN 500 MG PO CAPS: 500.0000 mg | ORAL_CAPSULE | Freq: Four times a day (QID) | ORAL | 0 refills | Status: DC

## 2019-12-24 NOTE — Discharge Instructions (Addendum)
X-ray normal, no signs of fractures or spread of infection to bone Begin clindamycin every 8 hours x 1 week Keep area clean and dry, change dressings twice daily Follow-up if redness pain swelling or wounds not improving

## 2019-12-24 NOTE — ED Triage Notes (Signed)
Pt here after stepping on plastic screw head 2 days ago; pt sts pulled out and was bleeding; pt sts now 2nd toe on left foot is red

## 2019-12-25 NOTE — ED Provider Notes (Signed)
[name]    CSN: [id number] Arrival date & time: 12/24/19  1539      History   Chief Complaint Chief Complaint  Patient presents with  . Wound Check    HPI [name] [name] is a 59 y.o. male history of DM type II, GERD, OSA, presenting today for evaluation of wound.  Patient was working in the yard, believes he got a screw between his first and second toe of his left foot, was unaware of this due to his neuropathy and believes the screw cause skin breakdown and wounds to interdigital space between first and second toes.  This occurred a few days ago.  Since his second toe has become increasingly red swollen as well as he has had drainage from these areas.  He denies any fevers.  Denies dizziness or lightheadedness.  Unsure of last tetanus.  HPI  Past Medical History:  Diagnosis Date  . Anxiety   . Arthritis   . Bipolar 1 disorder ([place])   . Blood transfusion without reported diagnosis   . Cataract   . Chronic pain   . Depressed bipolar disorder ([place])   . Depression   . Diabetes mellitus without complication ([place])   . Diabetic neuropathy ([place])   . GERD (gastroesophageal reflux disease)   . Herpes zoster 12/05/2008   Qualifier: Diagnosis of  By: Ronnald Ramp MD, Arvid Right.   . High cholesterol   . 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  Coupled with high doses of loperamide used for pain control.  Unintentional overdose -intractable VT leading to cardiogenic shock - ECMO  . Hyperlipidemia   . Hypertension   . Neuromuscular disorder ([place])    nerve damage back, neck, and shoulder  . Neuropathy in diabetes ([place])   . Sleep apnea    has CPAP but cannot use it  . Substance abuse ([place])    in past     Patient Active Problem List   Diagnosis Date Noted  . Skin lesion 12/07/2019  . Chronic pain disorder 09/13/2019  . DDD (degenerative disc disease), lumbar 09/13/2019  . Dyslipidemia 09/13/2019  .  Obesity 09/13/2019  . Peripheral neuropathy 09/13/2019  . Sleep apnea   . Prolonged Q-T interval on ECG 12/03/2016  . Drug-induced torsades de pointes ([place]) 12/03/2016  . Thrombocytopenia ([place]) 11/29/2016  . Personal history of ECMO 11/26/2016  . Personal history of spine surgery 11/26/2016  . S/P rotator cuff repair 11/26/2016  . 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 Mild concentric left ventricular hypertrophy (LVH) 09/11/2016  . DJD (degenerative joint disease) of cervical spine 02/26/2016  . Tinea pedis of both feet 06/03/2015  . Pre-ulcerative calluses 06/01/2015  . Type 2 diabetes mellitus with diabetic polyneuropathy ([place]) 06/09/2014  . Generalized anxiety disorder 06/19/2012  . Herpes zoster 12/05/2008  . Mixed hyperlipidemia 05/11/2008  . DEPRESSION 05/11/2008  . Essential hypertension 05/11/2008  . GERD 05/11/2008  . OSTEOARTHRITIS 05/11/2008  . LOW BACK PAIN 05/11/2008  . COLONIC POLYPS, HX OF 05/11/2008    Past Surgical History:  Procedure Laterality Date  . COLONOSCOPY    . 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 INTRAOPERATIVE TRANSESOPHAGEAL ECHOCARDIOGRAM N/A 11/26/2016   Procedure: INTRAOPERATIVE TRANSESOPHAGEAL ECHOCARDIOGRAM;  Surgeon: Ivin Poot, MD;  Location: [facility];  Service: Open Heart Surgery;  Laterality:  N/A;  . POLYPECTOMY    . SHOULDER ARTHROSCOPY WITH ROTATOR CUFF REPAIR Right 2009  . [place]  2010  . TRANSTHORACIC ECHOCARDIOGRAM  07/2016; 12/10/2016   a. Prior to VT arrest: normal. EF 55-60%. Gr 1 DD.  mild LVH. Mildly dilated Aortic Root.;; b.  Normal LV size and function.  Mild LVH.  EF 55%.  No RWMA.  No valve abnormalities.       Home Medications    Prior to Admission medications   Medication Sig Start Date End Date Taking? Authorizing Provider  amitriptyline (ELAVIL) 50 MG tablet  Take 50 mg by mouth at bedtime.    [provider]  atorvastatin (LIPITOR) 40 MG tablet TAKE 1 TABLET BY MOUTH DAILY AT 6 PM. 11/09/19   McClung, Dionne Bucy, PA-C  buprenorphine (SUBUTEX) 8 MG SUBL SL tablet Place under the tongue daily. 3 tabs daily    [provider]  clindamycin (CLEOCIN) 300 MG capsule Take 1 capsule (300 mg total) by mouth 3 (three) times daily for 7 days. 12/24/19 12/31/19  Wieters, Hallie C, PA-C  Dulaglutide (TRULICITY) 4.17 EY/8.1KG SOPN Inject 0.75 mg into the skin once a week. 11/09/19   Argentina Donovan, PA-C  fenofibrate (TRICOR) 145 MG tablet Take 1 tablet (145 mg total) by mouth daily. 11/09/19   Argentina Donovan, PA-C  hydrochlorothiazide (HYDRODIURIL) 25 MG tablet Take 1 tablet (25 mg total) by mouth daily. 11/09/19   Argentina Donovan, PA-C  Lancets (FREESTYLE) lancets Use as instructed 05/24/14   [provider]  losartan (COZAAR) 100 MG tablet Take 1 tablet (100 mg total) by mouth daily. 11/09/19   Argentina Donovan, PA-C  metFORMIN (GLUCOPHAGE-XR) 500 MG 24 hr tablet Take 2 tablets (1,000 mg total) by mouth 2 (two) times daily. 11/09/19   Argentina Donovan, PA-C  omeprazole (PRILOSEC) 40 MG capsule Take 40 mg by mouth daily.    [provider]  prazosin (MINIPRESS) 2 MG capsule Take 2 mg by mouth at bedtime.    [provider]  pregabalin (LYRICA) 300 MG capsule Take 300 mg by mouth daily.    [provider]  QUEtiapine (SEROQUEL) 300 MG tablet Take 150 mg by mouth at bedtime.    [provider]  Syringe/Needle, Disp, 30G X 1/2" 1 ML MISC UAD - use as directed 12/29/18   Ladell Pier, MD  sertraline (ZOLOFT) 100 MG tablet Take 100 mg by mouth daily.  11/09/19  [provider]    Family History Family History  Problem Relation Age of Onset  . Diabetes Mother   . Hyperlipidemia Mother   . Heart disease Father   . Colon cancer Neg Hx   . Colon polyps Neg Hx   . Esophageal cancer  Neg Hx   . Rectal cancer Neg Hx   . Stomach cancer Neg Hx     Social History Social History   Tobacco Use  . Smoking status: Never Smoker  . Smokeless tobacco: Never Used  Substance Use Topics  . Alcohol use: Not Currently  . Drug use: No     Allergies   Heparin, Oxytetracycline, Sulfa antibiotics, Del-mycin [erythromycin], Other, Sulfa antibiotics, Heparin, and Sulfonamide derivatives   Review of Systems Review of Systems  Constitutional: Negative for fatigue and fever.  Eyes: Negative for redness, itching and visual disturbance.  Respiratory: Negative for shortness of breath.   Cardiovascular: Negative for chest pain and leg swelling.  Gastrointestinal: Negative for nausea and vomiting.  Musculoskeletal: Positive for arthralgias and joint swelling. Negative for myalgias.  Skin: Positive for color change and wound. Negative for rash.  Neurological: Negative for dizziness, syncope, weakness, light-headedness and headaches.     Physical Exam Triage Vital Signs ED Triage Vitals  Enc Vitals Group     BP 12/24/19 1557 (!) 108/55     Pulse Rate 12/24/19 1557 78     Resp 12/24/19 1557 18     Temp 12/24/19 1557 98.5 F (36.9 C)     Temp Source 12/24/19 1557 Oral     SpO2 12/24/19 1557 97 %     Weight --      Height --      Head Circumference --      Peak Flow --      Pain Score 12/24/19 1558 3     Pain Loc --      Pain Edu? --      Excl. in [place]? --    No data found.  Updated Vital Signs BP (!) 108/55 (BP Location: Left Arm)   Pulse 78   Temp 98.5 F (36.9 C) (Oral)   Resp 18   SpO2 97%   Visual Acuity Right Eye Distance:   Left Eye Distance:   Bilateral Distance:    Right Eye Near:   Left Eye Near:    Bilateral Near:     Physical Exam Vitals and nursing note reviewed.  Constitutional:      Appearance: He is well-developed and well-nourished.     Comments: No acute distress  HENT:     Head: Normocephalic and atraumatic.     Nose: Nose normal.   Eyes:     Conjunctiva/sclera: Conjunctivae normal.  Cardiovascular:     Rate and Rhythm: Normal rate.  Pulmonary:     Effort: Pulmonary effort is normal. No respiratory distress.  Abdominal:     General: There is no distension.  Musculoskeletal:        General: Normal range of motion.     Cervical back: Neck supple.  Skin:    General: Skin is warm and dry.     Comments: Skin breakdown wounds noted between first and second toes, wound on second toe slightly deeper than first, second toe with surrounding erythema and swelling that extends throughout toe just to the base at the MCP joint, no extension onto dorsum of foot, dorsalis pedis 2+  Neurological:     Mental Status: He is alert and oriented to person, place, and time.  Psychiatric:        Mood and Affect: Mood and affect normal.      UC Treatments / Results  Labs (all labs ordered are listed, but only abnormal results are displayed) Labs Reviewed - No data to display  EKG   Radiology DG Foot Complete Left  Result Date: 12/24/2019 CLINICAL DATA:  Left foot pain and swelling for 2 days. Patient stepped on a plastic screw. Laceration between the first and second digits. EXAM: LEFT FOOT - COMPLETE 3+ VIEW COMPARISON:  None. FINDINGS: Moderate interphalangeal joint degenerative changes and moderate degenerative changes at the first MTP joint. No acute bony findings or destructive bony changes. No radiopaque foreign body is identified. IMPRESSION: No acute bony findings or radiopaque foreign body. Electronically Signed   By: Marijo Sanes M.D.   On: 12/24/2019 16:39    Procedures Procedures (including critical care time)  Medications Ordered in UC Medications  Tdap (BOOSTRIX) injection 0.5 mL (0.5 mLs Intramuscular Given 12/24/19  1621)    Initial Impression / Assessment and Plan / UC Course  I have reviewed the triage vital signs and the nursing notes.  Pertinent labs & imaging results that were available during my  care of the patient were reviewed by me and considered in my medical decision making (see chart for details).     Imaging negative for signs of fracture or osteomyelitis.  Tetanus updated today.  Initiating on clindamycin for cellulitis.  Discussed wound care.  Continue to monitor.Discussed strict return precautions. Patient verbalized understanding and is agreeable with plan.  Final Clinical Impressions(s) / UC Diagnoses   Final diagnoses:  Post-traumatic wound infection     Discharge Instructions     X-ray normal, no signs of fractures or spread of infection to bone Begin clindamycin every 8 hours x 1 week Keep area clean and dry, change dressings twice daily Follow-up if redness pain swelling or wounds not improving     ED Prescriptions    Medication Sig Dispense Auth. Provider   cephALEXin (KEFLEX) 500 MG capsule  (Status: Discontinued) Take 1 capsule (500 mg total) by mouth 4 (four) times daily for 7 days. 28 capsule Wieters, Hallie C, PA-C   clindamycin (CLEOCIN) 300 MG capsule Take 1 capsule (300 mg total) by mouth 3 (three) times daily for 7 days. 21 capsule Wieters, Chester C, PA-C     PDMP not reviewed this encounter.   Janith Lima, [place] 12/25/19 [phone]

## 2019-12-28 ENCOUNTER — Emergency Department (HOSPITAL_COMMUNITY)
Admission: EM | Admit: 2019-12-28 | Discharge: 2019-12-28 | Disposition: A | Discharge status: Home / Self Care | Payer: Medicare Other | Attending: Emergency Medicine | Admitting: Emergency Medicine

## 2019-12-28 ENCOUNTER — Encounter (HOSPITAL_COMMUNITY): Payer: Self-pay

## 2019-12-28 ENCOUNTER — Ambulatory Visit: Payer: Medicare Other | Admitting: Vascular Surgery

## 2019-12-28 ENCOUNTER — Other Ambulatory Visit: Payer: Self-pay

## 2019-12-28 DIAGNOSIS — Z794 Long term (current) use of insulin: Secondary | ICD-10-CM | POA: Insufficient documentation

## 2019-12-28 DIAGNOSIS — Z79899 Other long term (current) drug therapy: Secondary | ICD-10-CM | POA: Insufficient documentation

## 2019-12-28 DIAGNOSIS — Z5189 Encounter for other specified aftercare: Secondary | ICD-10-CM

## 2019-12-28 DIAGNOSIS — S91302D Unspecified open wound, left foot, subsequent encounter: Secondary | ICD-10-CM | POA: Insufficient documentation

## 2019-12-28 DIAGNOSIS — Y92007 Garden or yard of unspecified non-institutional (private) residence as the place of occurrence of the external cause: Secondary | ICD-10-CM | POA: Diagnosis not present

## 2019-12-28 DIAGNOSIS — Z4800 Encounter for change or removal of nonsurgical wound dressing: Secondary | ICD-10-CM | POA: Diagnosis not present

## 2019-12-28 DIAGNOSIS — E1142 Type 2 diabetes mellitus with diabetic polyneuropathy: Secondary | ICD-10-CM | POA: Insufficient documentation

## 2019-12-28 DIAGNOSIS — W268XXD Contact with other sharp object(s), not elsewhere classified, subsequent encounter: Secondary | ICD-10-CM | POA: Insufficient documentation

## 2019-12-28 DIAGNOSIS — L03116 Cellulitis of left lower limb: Secondary | ICD-10-CM

## 2019-12-28 DIAGNOSIS — I10 Essential (primary) hypertension: Secondary | ICD-10-CM | POA: Diagnosis not present

## 2019-12-28 DIAGNOSIS — Z7984 Long term (current) use of oral hypoglycemic drugs: Secondary | ICD-10-CM | POA: Diagnosis not present

## 2019-12-28 DIAGNOSIS — E1140 Type 2 diabetes mellitus with diabetic neuropathy, unspecified: Secondary | ICD-10-CM | POA: Insufficient documentation

## 2019-12-28 MED ORDER — DOXYCYCLINE HYCLATE 100 MG PO CAPS: 100.0000 mg | ORAL_CAPSULE | Freq: Two times a day (BID) | ORAL | 0 refills | Status: DC

## 2019-12-28 MED FILL — HYDROCHLOROTHIAZIDE 25 MG T: 25 | 30 days supply | Qty: 30 | Fill #1

## 2019-12-28 MED FILL — FENOFIBRATE 145 MG TABLET: 145 | 30 days supply | Qty: 30 | Fill #3

## 2019-12-28 MED FILL — metFORMIN HCL ER 500 MG TB2: 500 | 30 days supply | Qty: 120 | Fill #4

## 2019-12-28 MED FILL — LOSARTAN POTASSIUM 100 MG T: 100 | 30 days supply | Qty: 30 | Fill #3

## 2019-12-28 MED FILL — ATORVASTATIN CALCIUM 40 MG: 40 | 30 days supply | Qty: 30 | Fill #3

## 2019-12-28 NOTE — ED Provider Notes (Signed)
[facility] DEPT Provider Note   CSN: [id number] Arrival date & time: 12/28/19  8338     History Chief Complaint  Patient presents with  . Wound Check    [name] [name] is a 59 y.o. male.  Patient is a 59 year old male with a history of diabetes, hypertension, diabetic neuropathy in the feet, chronic venous stasis who presents today with concern for a wound on his left foot.  Patient reports 1 week ago he was doing yard work and looked down and noticed that his left foot was bleeding.  He removed tissue and found a screw lodged between his left great toe and second toe.  He reports he has no feeling in his foot due to his diabetes and is not sure how long it has been there.  He pulled it out and reported quite a bit of skin came with it.  He had been using a saline spray and trying to wrap the toe but noticed that it was starting to get red and went to urgent care on Saturday.  He had an x-ray when he arrived there that showed no evidence of osteomyelitis, foreign body or fracture and was started on clindamycin and that time told if the redness worsened he needed to go to the emergency room.  Patient reports that over the last day or 2 he noticed some redness on the top of his left foot but denied any redness spreading up the foot or into the leg.  He has not had any systemic symptoms such as fever, general malaise, weakness, nausea, vomiting.  He was putting Neosporin on the wound but stopped 2 days ago.  He recently started on Trulicity this month and reports that he has been working trying to get his blood sugar in a more controlled level but has not been doing well prior to this month.  The history is provided by the patient.  Wound Check This is a new problem. Episode onset: 1 week ago. The problem occurs constantly. The problem has been gradually worsening. Nothing relieves the symptoms.       Past Medical History:  Diagnosis Date  . Anxiety   .  Arthritis   . Bipolar 1 disorder ([place])   . Blood transfusion without reported diagnosis   . Cataract   . Chronic pain   . Depressed bipolar disorder ([place])   . Depression   . Diabetes mellitus without complication ([place])   . Diabetic neuropathy ([place])   . GERD (gastroesophageal reflux disease)   . Herpes zoster 12/05/2008   Qualifier: Diagnosis of  By: Ronnald Ramp MD, Arvid Right.   . High cholesterol   . 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  Coupled with high doses of loperamide used for pain control.  Unintentional overdose -intractable VT leading to cardiogenic shock - ECMO  . Hyperlipidemia   . Hypertension   . Neuromuscular disorder ([place])    nerve damage back, neck, and shoulder  . Neuropathy in diabetes ([place])   . Sleep apnea    has CPAP but cannot use it  . Substance abuse ([place])    in past     Patient Active Problem List   Diagnosis Date Noted  . Skin lesion 12/07/2019  . Chronic pain disorder 09/13/2019  . DDD (degenerative disc disease), lumbar 09/13/2019  . Dyslipidemia 09/13/2019  . Obesity 09/13/2019  . Peripheral neuropathy 09/13/2019  . Sleep apnea   .  Prolonged Q-T interval on ECG 12/03/2016  . Drug-induced torsades de pointes ([place]) 12/03/2016  . Thrombocytopenia ([place]) 11/29/2016  . Personal history of ECMO 11/26/2016  . Personal history of spine surgery 11/26/2016  . S/P rotator cuff repair 11/26/2016  . 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 Mild concentric left ventricular hypertrophy (LVH) 09/11/2016  . DJD (degenerative joint disease) of cervical spine 02/26/2016  . Tinea pedis of both feet 06/03/2015  . Pre-ulcerative calluses 06/01/2015  . Type 2 diabetes mellitus with diabetic polyneuropathy ([place]) 06/09/2014  . Generalized anxiety disorder 06/19/2012  . Herpes zoster 12/05/2008  . Mixed hyperlipidemia 05/11/2008  . DEPRESSION 05/11/2008  .  Essential hypertension 05/11/2008  . GERD 05/11/2008  . OSTEOARTHRITIS 05/11/2008  . LOW BACK PAIN 05/11/2008  . COLONIC POLYPS, HX OF 05/11/2008    Past Surgical History:  Procedure Laterality Date  . COLONOSCOPY    . 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 INTRAOPERATIVE TRANSESOPHAGEAL ECHOCARDIOGRAM N/A 11/26/2016   Procedure: INTRAOPERATIVE TRANSESOPHAGEAL ECHOCARDIOGRAM;  Surgeon: Ivin Poot, MD;  Location: [facility];  Service: Open Heart Surgery;  Laterality: N/A;  . POLYPECTOMY    . SHOULDER ARTHROSCOPY WITH ROTATOR CUFF REPAIR Right 2009  . [place]  2010  . TRANSTHORACIC ECHOCARDIOGRAM  07/2016; 12/10/2016   a. Prior to VT arrest: normal. EF 55-60%. Gr 1 DD.  mild LVH. Mildly dilated Aortic Root.;; b.  Normal LV size and function.  Mild LVH.  EF 55%.  No RWMA.  No valve abnormalities.       Family History  Problem Relation Age of Onset  . Diabetes Mother   . Hyperlipidemia Mother   . Heart disease Father   . Colon cancer Neg Hx   . Colon polyps Neg Hx   . Esophageal cancer Neg Hx   . Rectal cancer Neg Hx   . Stomach cancer Neg Hx     Social History   Tobacco Use  . Smoking status: Never Smoker  . Smokeless tobacco: Never Used  Substance Use Topics  . Alcohol use: Not Currently  . Drug use: No    Home Medications Prior to Admission medications   Medication Sig Start Date End Date Taking? Authorizing Provider  amitriptyline (ELAVIL) 50 MG tablet Take 50 mg by mouth at bedtime.    [provider]  atorvastatin (LIPITOR) 40 MG tablet TAKE 1 TABLET BY MOUTH DAILY AT 6 PM. 11/09/19   McClung, Dionne Bucy, PA-C  buprenorphine (SUBUTEX) 8 MG SUBL SL tablet Place under the tongue daily. 3 tabs daily    [provider]  doxycycline (VIBRAMYCIN) 100 MG capsule Take 1 capsule (100 mg total) by mouth 2 (two) times daily. 12/28/19   Blanchie Dessert, MD  Dulaglutide (TRULICITY) 1.96 QI/2.9NL SOPN Inject 0.75 mg into the skin once a week. 11/09/19   Argentina Donovan, PA-C  fenofibrate (TRICOR) 145 MG tablet Take 1 tablet (145 mg total) by mouth daily. 11/09/19   Argentina Donovan, PA-C  hydrochlorothiazide (HYDRODIURIL) 25 MG tablet Take 1 tablet (25 mg total) by mouth daily. 11/09/19   Argentina Donovan, PA-C  Lancets (FREESTYLE) lancets Use as instructed 05/24/14   [provider]  losartan (COZAAR) 100 MG tablet Take 1 tablet (100 mg total) by mouth daily. 11/09/19   Argentina Donovan, PA-C  metFORMIN (GLUCOPHAGE-XR) 500 MG 24 hr tablet Take  2 tablets (1,000 mg total) by mouth 2 (two) times daily. 11/09/19   Argentina Donovan, PA-C  omeprazole (PRILOSEC) 40 MG capsule Take 40 mg by mouth daily.    [provider]  prazosin (MINIPRESS) 2 MG capsule Take 2 mg by mouth at bedtime.    [provider]  pregabalin (LYRICA) 300 MG capsule Take 300 mg by mouth daily.    [provider]  QUEtiapine (SEROQUEL) 300 MG tablet Take 150 mg by mouth at bedtime.    [provider]  Syringe/Needle, Disp, 30G X 1/2" 1 ML MISC UAD - use as directed 12/29/18   Ladell Pier, MD  sertraline (ZOLOFT) 100 MG tablet Take 100 mg by mouth daily.  11/09/19  [provider]    Allergies    Heparin, Oxytetracycline, Sulfa antibiotics, Del-mycin [erythromycin], Other, Sulfa antibiotics, Heparin, and Sulfonamide derivatives  Review of Systems   Review of Systems  All other systems reviewed and are negative.   Physical Exam Updated Vital Signs BP 128/70 (BP Location: Left Arm)   Pulse 96   Temp 98.7 F (37.1 C) (Oral)   Resp 19   Ht 6\' 2"  (1.88 m)   Wt 113.4 kg   SpO2 96%   BMI 32.10 kg/m   Physical Exam Vitals and nursing note reviewed.  Constitutional:      General: He is not in acute distress.    Appearance: Normal appearance. He is obese.  HENT:     Head: Normocephalic.  Eyes:      Pupils: Pupils are equal, round, and reactive to light.  Cardiovascular:     Rate and Rhythm: Normal rate and regular rhythm.     Pulses: Normal pulses.  Pulmonary:     Effort: Pulmonary effort is normal.     Breath sounds: Normal breath sounds.  Abdominal:     General: Abdomen is flat.  Musculoskeletal:     Right lower leg: Edema present.     Left lower leg: Edema present.       Feet:     Comments: Bilateral venous stasis noted and skin changes consistent.  2+ pitting edema to the midshin bilaterally  Skin:    General: Skin is warm and dry.     Capillary Refill: Capillary refill takes less than 2 seconds.  Neurological:     Mental Status: He is alert and oriented to person, place, and time. Mental status is at baseline.     Motor: No weakness.     Comments: Decreased sensation in the feet in the stocking distribution  Psychiatric:        Mood and Affect: Mood normal.        Behavior: Behavior normal.        Thought Content: Thought content normal.     ED Results / Procedures / Treatments   Labs (all labs ordered are listed, but only abnormal results are displayed) Labs Reviewed - No data to display  EKG None  Radiology No results found.  Procedures Procedures (including critical care time)  Medications Ordered in ED Medications - No data to display  ED Course  I have reviewed the triage vital signs and the nursing notes.  Pertinent labs & imaging results that were available during my care of the patient were reviewed by me and considered in my medical decision making (see chart for details).    MDM Rules/Calculators/A&P  Well appearing 59 year old diabetic male presenting today with a wound on his left foot.  This occurred on Wednesday 1 week ago.  Patient was seen on Saturday and had negative x-rays for osteo or retained foreign body or fracture.  Patient does have erythema on the dorsal portion of the toe and a small portion on the  dorsum of the distal foot.  He has no systemic symptoms at this time.  Patient was started on clindamycin and concerned that there may be some resistance but concerned that there is early infection.  There is no purulent drainage or signs concerning for abscess at this time.  Instructed patient to wear compression socks to help with the swelling and venous stasis.  Discussed elevating the legs.  Also discussed trying to keep the wound dry and given a referral to wound care.  Patient antibiotics switched from clindamycin to doxycycline.  Patient given return precautions.  Also encouraged to follow-up with PCP on Friday or Monday if unable to see wound care.  Final Clinical Impression(s) / ED Diagnoses Final diagnoses:  Cellulitis of left foot  Visit for wound check    Rx / DC Orders ED Discharge Orders         Ordered    doxycycline (VIBRAMYCIN) 100 MG capsule  2 times daily        12/28/19 0720    Ambulatory referral to Wound Clinic       Comments: Diabetic with wound 1 week ago from a screw getting lodged in the foot.  Currently on doxycycline started today 12/15 and surrounding erythema with wound between the 1st and 2nd toe   12/28/19 1962           Blanchie Dessert, MD 12/28/19 [phone]

## 2019-12-28 NOTE — ED Notes (Signed)
Pt states he stepped on a screw with his left foot. Pt states the screw went between his left fourth and fifth toe. Pt was seen at urgent care and was placed on antibiotics/ pt wants to have the wound reevaluated

## 2019-12-28 NOTE — ED Triage Notes (Signed)
Patient arrived stating that he stepped on a plastic screw on 12/9 and has been on an antibiotic. Reports the redness and swelling has moved into his left foot. Hx of diabetes

## 2019-12-28 NOTE — Discharge Instructions (Signed)
Make sure you clean the wound but then let it dry completely before bandaging.  When you can elevate the legs and wear compression socks to help with the swelling.  We are switching your antibiotics so stop taking the clindamycin.  If you start having fever, feeling badly, the redness gets significantly worse return to the emergency room.  A referral was made to wound care but if you do not hear from them in the next 1 to 2 days you can try calling the number provided.

## 2020-01-02 ENCOUNTER — Emergency Department (HOSPITAL_COMMUNITY)
Admission: EM | Admit: 2020-01-02 | Discharge: 2020-01-02 | Disposition: A | Discharge status: Home / Self Care | Payer: Medicare Other | Attending: Emergency Medicine | Admitting: Emergency Medicine

## 2020-01-02 ENCOUNTER — Emergency Department (HOSPITAL_COMMUNITY): Payer: Medicare Other

## 2020-01-02 ENCOUNTER — Encounter (HOSPITAL_COMMUNITY): Payer: Self-pay | Admitting: Emergency Medicine

## 2020-01-02 ENCOUNTER — Telehealth: Payer: Self-pay | Admitting: Podiatry

## 2020-01-02 ENCOUNTER — Other Ambulatory Visit (HOSPITAL_COMMUNITY): Payer: Self-pay | Admitting: Emergency Medicine

## 2020-01-02 DIAGNOSIS — Y288XXA Contact with other sharp object, undetermined intent, initial encounter: Secondary | ICD-10-CM | POA: Diagnosis not present

## 2020-01-02 DIAGNOSIS — I10 Essential (primary) hypertension: Secondary | ICD-10-CM | POA: Diagnosis not present

## 2020-01-02 DIAGNOSIS — L03039 Cellulitis of unspecified toe: Secondary | ICD-10-CM | POA: Diagnosis not present

## 2020-01-02 DIAGNOSIS — M7989 Other specified soft tissue disorders: Secondary | ICD-10-CM | POA: Diagnosis not present

## 2020-01-02 DIAGNOSIS — I87022 Postthrombotic syndrome with inflammation of left lower extremity: Secondary | ICD-10-CM | POA: Diagnosis not present

## 2020-01-02 DIAGNOSIS — E1140 Type 2 diabetes mellitus with diabetic neuropathy, unspecified: Secondary | ICD-10-CM | POA: Diagnosis not present

## 2020-01-02 DIAGNOSIS — L089 Local infection of the skin and subcutaneous tissue, unspecified: Secondary | ICD-10-CM | POA: Insufficient documentation

## 2020-01-02 DIAGNOSIS — Z79899 Other long term (current) drug therapy: Secondary | ICD-10-CM | POA: Insufficient documentation

## 2020-01-02 DIAGNOSIS — S91102A Unspecified open wound of left great toe without damage to nail, initial encounter: Secondary | ICD-10-CM | POA: Diagnosis not present

## 2020-01-02 DIAGNOSIS — T148XXA Other injury of unspecified body region, initial encounter: Secondary | ICD-10-CM

## 2020-01-02 DIAGNOSIS — S99922A Unspecified injury of left foot, initial encounter: Secondary | ICD-10-CM | POA: Diagnosis present

## 2020-01-02 DIAGNOSIS — Z794 Long term (current) use of insulin: Secondary | ICD-10-CM | POA: Insufficient documentation

## 2020-01-02 DIAGNOSIS — S91302A Unspecified open wound, left foot, initial encounter: Secondary | ICD-10-CM | POA: Diagnosis not present

## 2020-01-02 LAB — CBC WITH DIFFERENTIAL/PLATELET
Abs Immature Granulocytes: 0.04 10*3/uL (ref 0.00–0.07)
Basophils Absolute: 0 10*3/uL (ref 0.0–0.1)
Basophils Relative: 1 %
Eosinophils Absolute: 0.1 10*3/uL (ref 0.0–0.5)
Eosinophils Relative: 2 %
HCT: 33.2 % — ABNORMAL LOW (ref 39.0–52.0)
Hemoglobin: 10.9 g/dL — ABNORMAL LOW (ref 13.0–17.0)
Immature Granulocytes: 1 %
Lymphocytes Relative: 15 %
Lymphs Abs: 0.8 10*3/uL (ref 0.7–4.0)
MCH: 26.9 pg (ref 26.0–34.0)
MCHC: 32.8 g/dL (ref 30.0–36.0)
MCV: 82 fL (ref 80.0–100.0)
Monocytes Absolute: 0.3 10*3/uL (ref 0.1–1.0)
Monocytes Relative: 6 %
Neutro Abs: 4.1 10*3/uL (ref 1.7–7.7)
Neutrophils Relative %: 75 %
Platelets: 180 10*3/uL (ref 150–400)
RBC: 4.05 MIL/uL — ABNORMAL LOW (ref 4.22–5.81)
RDW: 13.2 % (ref 11.5–15.5)
WBC: 5.4 10*3/uL (ref 4.0–10.5)
nRBC: 0 % (ref 0.0–0.2)

## 2020-01-02 LAB — COMPREHENSIVE METABOLIC PANEL
ALT: 24 U/L (ref 0–44)
AST: 19 U/L (ref 15–41)
Albumin: 3.9 g/dL (ref 3.5–5.0)
Alkaline Phosphatase: 44 U/L (ref 38–126)
Anion gap: 13 (ref 5–15)
BUN: 23 mg/dL — ABNORMAL HIGH (ref 6–20)
CO2: 25 mmol/L (ref 22–32)
Calcium: 9 mg/dL (ref 8.9–10.3)
Chloride: 100 mmol/L (ref 98–111)
Creatinine, Ser: 1.02 mg/dL (ref 0.61–1.24)
GFR, Estimated: >60 mL/min (ref >60)
Glucose, Bld: 163 mg/dL — ABNORMAL HIGH (ref 70–99)
Potassium: 3.4 mmol/L — ABNORMAL LOW (ref 3.5–5.1)
Sodium: 138 mmol/L (ref 135–145)
Total Bilirubin: 1.1 mg/dL (ref 0.3–1.2)
Total Protein: 7.3 g/dL (ref 6.5–8.1)

## 2020-01-02 LAB — LACTIC ACID, PLASMA: Lactic Acid, Venous: 1.4 mmol/L (ref 0.5–1.9)

## 2020-01-02 LAB — SEDIMENTATION RATE: Sed Rate: 43 mm/hr — ABNORMAL HIGH (ref 0–16)

## 2020-01-02 MED ORDER — LEVOFLOXACIN 500 MG PO TABS
500.0000 mg | ORAL_TABLET | Freq: Once | ORAL | Status: AC
  Administered 2020-01-02: 500 mg via ORAL
  Filled 2020-01-02: qty 1

## 2020-01-02 MED ORDER — ONDANSETRON 4 MG PO TBDP
4.0000 mg | ORAL_TABLET | Freq: Once | ORAL | Status: AC
  Administered 2020-01-02: 4 mg via ORAL
  Filled 2020-01-02: qty 1

## 2020-01-02 MED ORDER — ONDANSETRON 4 MG PO TBDP: 4.0000 mg | ORAL_TABLET | Freq: Three times a day (TID) | ORAL | 0 refills | Status: DC | PRN

## 2020-01-02 MED ORDER — LEVOFLOXACIN 500 MG PO TABS
500.0000 mg | ORAL_TABLET | Freq: Every day | ORAL | 0 refills | Status: DC
Start: 2020-01-02 — End: 2020-01-02

## 2020-01-02 NOTE — Telephone Encounter (Signed)
Was this pt able to be seen today?

## 2020-01-02 NOTE — ED Provider Notes (Signed)
[facility] DEPT Provider Note   CSN: [id number] Arrival date & time: 01/02/20  1101     History Chief Complaint  Patient presents with  . Foot Injury    [name] [name] is a 59 y.o. male.  HPI Patient is a 59 year old male with a history of DM, HTN, diabetic neuropathy and chronic venous stasis who presents today with concern for wound to his left foot of the first and second digit.  He states that on 12/10 he was doing yard work and stopped on a screw that went through his shoe into his foot.  He states that he initially did not notice this because of his neuropathy but noticed that there was some bleeding and went inside and took his shoe off to find that the screw had penetrated between his 2 toes it had entered the bottom of his shoe and he was bleeding from the areas between his first and second digit.  He states since that time he has had no real pain which he attributes to his diabetic neuropathy.  He states that the redness which is present currently has not significantly changed since he was last seen in the ER 5 days ago.  He states that when this occurred 12/10 he followed up at the urgent care the next day where he had an x-ray and was started on clindamycin.  He states he took this for approximately 5 days until he came to the ER and was placed on doxycycline.  He states he is still taking the doxycycline as prescribed twice daily.  He denies any systemic symptoms but did notice that his left leg seemed more swollen than usual.  He states he has chronic venous insufficiency and takes Lasix for this which usually helps.  He denies any history of heart failure kidney disease or liver disease.  He denies any fevers, chills, nausea, vomiting, shortness of breath.  He denies any pain at all and is primarily concerned about toe infection.  He was set up by prior provider for wound care evaluation early in January.  He states he is able to see his  primary care doctor within the week/within a few days.  He has no other associated symptoms.  No aggravating or mitigating factors. Has not tried any medications prior to arrival other than his antibiotics that she is taking.    Past Medical History:  Diagnosis Date  . Anxiety   . Arthritis   . Bipolar 1 disorder ([place])   . Blood transfusion without reported diagnosis   . Cataract   . Chronic pain   . Depressed bipolar disorder ([place])   . Depression   . Diabetes mellitus without complication ([place])   . Diabetic neuropathy ([place])   . GERD (gastroesophageal reflux disease)   . Herpes zoster 12/05/2008   Qualifier: Diagnosis of  By: Ronnald Ramp MD, Arvid Right.   . High cholesterol   . 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  Coupled with high doses of loperamide used for pain control.  Unintentional overdose -intractable VT leading to cardiogenic shock - ECMO  . Hyperlipidemia   . Hypertension   . Neuromuscular disorder ([place])    nerve damage back, neck, and shoulder  . Neuropathy in diabetes ([place])   . Sleep apnea    has CPAP but cannot use it  . Substance abuse ([place])    in past     Patient Active Problem  List   Diagnosis Date Noted  . Skin lesion 12/07/2019  . Chronic pain disorder 09/13/2019  . DDD (degenerative disc disease), lumbar 09/13/2019  . Dyslipidemia 09/13/2019  . Obesity 09/13/2019  . Peripheral neuropathy 09/13/2019  . Sleep apnea   . Prolonged Q-T interval on ECG 12/03/2016  . Drug-induced torsades de pointes (HCC) 12/03/2016  . Thrombocytopenia (HCC) 11/29/2016  . Personal history of ECMO 11/26/2016  . Personal history of spine surgery 11/26/2016  . S/P rotator cuff repair 11/26/2016  . 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 Mild concentric left ventricular hypertrophy (LVH) 09/11/2016  . DJD (degenerative joint disease) of cervical spine 02/26/2016  .  Tinea pedis of both feet 06/03/2015  . Pre-ulcerative calluses 06/01/2015  . Type 2 diabetes mellitus with diabetic polyneuropathy (HCC) 06/09/2014  . Generalized anxiety disorder 06/19/2012  . Herpes zoster 12/05/2008  . Mixed hyperlipidemia 05/11/2008  . DEPRESSION 05/11/2008  . Essential hypertension 05/11/2008  . GERD 05/11/2008  . OSTEOARTHRITIS 05/11/2008  . LOW BACK PAIN 05/11/2008  . COLONIC POLYPS, HX OF 05/11/2008    Past Surgical History:  Procedure Laterality Date  . COLONOSCOPY    . 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 INTRAOPERATIVE TRANSESOPHAGEAL ECHOCARDIOGRAM N/A 11/26/2016   Procedure: INTRAOPERATIVE TRANSESOPHAGEAL ECHOCARDIOGRAM;  Surgeon: Kerin Perna, MD;  Location: [facility];  Service: Open Heart Surgery;  Laterality: N/A;  . POLYPECTOMY    . SHOULDER ARTHROSCOPY WITH ROTATOR CUFF REPAIR Right 2009  . SPINE SURGERY  2010  . TRANSTHORACIC ECHOCARDIOGRAM  07/2016; 12/10/2016   a. Prior to VT arrest: normal. EF 55-60%. Gr 1 DD.  mild LVH. Mildly dilated Aortic Root.;; b.  Normal LV size and function.  Mild LVH.  EF 55%.  No RWMA.  No valve abnormalities.       Family History  Problem Relation Age of Onset  . Diabetes Mother   . Hyperlipidemia Mother   . Heart disease Father   . Colon cancer Neg Hx   . Colon polyps Neg Hx   . Esophageal cancer Neg Hx   . Rectal cancer Neg Hx   . Stomach cancer Neg Hx     Social History   Tobacco Use  . Smoking status: Never Smoker  . Smokeless tobacco: Never Used  Substance Use Topics  . Alcohol use: Not Currently  . Drug use: No    Home Medications Prior to Admission medications   Medication Sig Start Date End Date Taking? Authorizing Provider  amitriptyline (ELAVIL) 50 MG tablet Take 50 mg by mouth at bedtime.    [provider]  atorvastatin (LIPITOR) 40 MG tablet TAKE 1 TABLET BY MOUTH DAILY AT  6 PM. 11/09/19   McClung, Marzella Schlein, PA-C  buprenorphine (SUBUTEX) 8 MG SUBL SL tablet Place under the tongue daily. 3 tabs daily    [provider]  doxycycline (VIBRAMYCIN) 100 MG capsule Take 1 capsule (100 mg total) by mouth 2 (two) times daily. 12/28/19   Gwyneth Sprout, MD  Dulaglutide (TRULICITY) 0.75 MG/0.5ML SOPN Inject 0.75 mg into the skin once a week. 11/09/19   Anders Simmonds, PA-C  fenofibrate (TRICOR) 145 MG tablet Take 1 tablet (145 mg total) by mouth daily. 11/09/19   Anders Simmonds, PA-C  hydrochlorothiazide (HYDRODIURIL) 25 MG tablet Take 1 tablet (25 mg total) by mouth daily. 11/09/19   Anders Simmonds,  PA-C  Lancets (FREESTYLE) lancets Use as instructed 05/24/14   [provider]  levofloxacin (LEVAQUIN) 500 MG tablet Take 1 tablet (500 mg total) by mouth daily. 01/02/20   Tedd Sias, PA  losartan (COZAAR) 100 MG tablet Take 1 tablet (100 mg total) by mouth daily. 11/09/19   Argentina Donovan, PA-C  metFORMIN (GLUCOPHAGE-XR) 500 MG 24 hr tablet Take 2 tablets (1,000 mg total) by mouth 2 (two) times daily. 11/09/19   Argentina Donovan, PA-C  omeprazole (PRILOSEC) 40 MG capsule Take 40 mg by mouth daily.    [provider]  ondansetron (ZOFRAN ODT) 4 MG disintegrating tablet Take 1 tablet (4 mg total) by mouth every 8 (eight) hours as needed for nausea or vomiting. 01/02/20   Tedd Sias, PA  prazosin (MINIPRESS) 2 MG capsule Take 2 mg by mouth at bedtime.    [provider]  pregabalin (LYRICA) 300 MG capsule Take 300 mg by mouth daily.    [provider]  QUEtiapine (SEROQUEL) 300 MG tablet Take 150 mg by mouth at bedtime.    [provider]  Syringe/Needle, Disp, 30G X 1/2" 1 ML MISC UAD - use as directed 12/29/18   Ladell Pier, MD  sertraline (ZOLOFT) 100 MG tablet Take 100 mg by mouth daily.  11/09/19  [provider]    Allergies    Heparin, Oxytetracycline, Sulfa antibiotics,  Del-mycin [erythromycin], Other, Sulfa antibiotics, Heparin, and Sulfonamide derivatives  Review of Systems   Review of Systems  Constitutional: Negative for chills and fever.  HENT: Negative for congestion.   Eyes: Negative for pain.  Respiratory: Negative for cough and shortness of breath.   Cardiovascular: Negative for chest pain and leg swelling.  Gastrointestinal: Negative for abdominal pain, diarrhea, nausea and vomiting.  Genitourinary: Negative for dysuria.  Musculoskeletal: Negative for myalgias.       Left 1st/2nd toe pain, swelling and redness   Skin: Negative for rash.  Neurological: Negative for dizziness and headaches.    Physical Exam Updated Vital Signs BP (!) 146/74 (BP Location: Right Arm)   Pulse 71   Temp 99.4 F (37.4 C) (Oral)   Resp 12   Ht $R'6\' 2"'TE$  (1.88 m)   Wt 113.4 kg   SpO2 100%   BMI 32.10 kg/m   Physical Exam Vitals and nursing note reviewed.  Constitutional:      General: He is not in acute distress.    Comments: Pleasant well-appearing 59 year old.  In no acute distress.  Sitting comfortably in bed.  Able answer questions appropriately follow commands. No increased work of breathing. Speaking in full sentences.  HENT:     Head: Normocephalic and atraumatic.     Nose: Nose normal.  Eyes:     General: No scleral icterus. Cardiovascular:     Rate and Rhythm: Normal rate and regular rhythm.     Pulses: Normal pulses.     Heart sounds: Normal heart sounds.     Comments: DP/PT pulses 2+ and symmetric Pulmonary:     Effort: Pulmonary effort is normal. No respiratory distress.  Musculoskeletal:     Cervical back: Normal range of motion.     Right lower leg: Edema present.     Left lower leg: Edema present.     Comments: Chronic stable 1+ pitting edema bilaterally. No calf tenderness.  Skin:    General: Skin is warm and dry.     Capillary Refill: Capillary refill takes less than 2 seconds.  Comments: Macerated appearing shallow wound to  the space between the first and second digit of the left foot.  The medial aspect of the second digit and lateral aspect of the first digit.  There is some erythema present and some erythema that extends from the second digit onto the dorsum of the foot approximately 1 inch.  Full range of motion of both toes.  Sensation is markedly decreased due to chronic neuropathy.  Neurological:     Mental Status: He is alert. Mental status is at baseline.  Psychiatric:        Mood and Affect: Mood normal.        Behavior: Behavior normal.              ED Results / Procedures / Treatments   Labs (all labs ordered are listed, but only abnormal results are displayed) Labs Reviewed  COMPREHENSIVE METABOLIC PANEL - Abnormal; Notable for the following components:      Result Value   Potassium 3.4 (*)    Glucose, Bld 163 (*)    BUN 23 (*)    All other components within normal limits  CBC WITH DIFFERENTIAL/PLATELET - Abnormal; Notable for the following components:   RBC 4.05 (*)    Hemoglobin 10.9 (*)    HCT 33.2 (*)    All other components within normal limits  SEDIMENTATION RATE - Abnormal; Notable for the following components:   Sed Rate 43 (*)    All other components within normal limits  LACTIC ACID, PLASMA    EKG None  Radiology DG Foot Complete Left  Result Date: 01/02/2020 CLINICAL DATA:  Left first and second toe wounds. Low suspicion for osteomyelitis. EXAM: LEFT FOOT - COMPLETE 3+ VIEW COMPARISON:  December 24, 2019 FINDINGS: There is a new lucency in the medial base of the middle phalanx of the second digit. There is surrounding soft tissue swelling. There are degenerative changes of the interphalangeal joint of the first digit. There is soft tissue swelling about the forefoot. There is no radiopaque foreign body. IMPRESSION: 1. New lucency in the medial base of the middle phalanx of the second digit suspicious for osteomyelitis in the appropriate clinical setting. There is  surrounding soft tissue swelling. 2. No acute displaced fracture or dislocation. Electronically Signed   By: Constance Holster M.D.   On: 01/02/2020 16:35    Procedures Procedures (including critical care time)  Medications Ordered in ED Medications  levofloxacin (LEVAQUIN) tablet 500 mg (500 mg Oral Given 01/02/20 1928)  ondansetron (ZOFRAN-ODT) disintegrating tablet 4 mg (4 mg Oral Given 01/02/20 1928)    ED Course  I have reviewed the triage vital signs and the nursing notes.  Pertinent labs & imaging results that were available during my care of the patient were reviewed by me and considered in my medical decision making (see chart for details).  Patient here with foot wounds. He has been treated with clindamycin as well as doxycycline.  He just finished his last dose of doxycycline today.  Mechanism was a metal screw through the shoe into his foot.  It has not encountered any bone on this path seems to have been caused only cutaneous wounds.  Discussed with Dr. Karle Starch prior to his shift ending who recommended xray/esr.  X-ray shows lucency which is concerning for osteomyelitis but not diagnostic of this.  ESR very mildly elevated.  Lactic with normal but no leukocytosis.  Stable anemia.  CMP without any significant abnormalities apart from mildly elevated blood sugar.  Vital signs within normal months.  Discussed the case with attending physician DR. Schlossman who recommended discussion with orthopedics.  Clinical Course as of 01/02/20 1942  Mon Jan 02, 2020  1547 Discussed with pharmacy who recommends levoquin for pseudomonas coverage (will also cover staph epi which cipro is not as helpful for). [WF]  1815 Discussed with Dr. Lynann Bologna of [facility] orthopedics.  Recommends Dr. Lucia Gaskins for follow-up.  Recommends patient call tomorrow morning to make an appointment.  Discussed need for admission versus discharge--states that if patient is wishing to go home he may with close  return precautions. [WF]  1941 Patient with history of prolonged QT however on my review of EKGs for the past year he has had consistently less than 450 on his QT interval. [WF]  1941 Will prescribe patient Levaquin and Zofran. Given his consistently normal QT I have low concern for prolonging patient's QT  [WF]    Clinical Course User Index [WF] Tedd Sias, [place]   MDM Rules/Calculators/A&P                          Patient was offered admission to the hospital for potential osteomyelitis.  He is requesting discharge home at this time because he needs to take care of his mother with dementia.  I counseled patient on need for very close follow-up and the my concern that this may be osteomyelitis and may progress to severe infection, sepsis.  He is understanding of these risks.  Would like to be discharged.  Will provide patient with dose of Levaquin prior to discharge.  We will also give him some Zofran.  His QTC is within normal limits per his most recent EKGs.  He understands return precautions.  No further questions at this time.  Final Clinical Impression(s) / ED Diagnoses Final diagnoses:  Toe infection  Wound infection    Rx / DC Orders ED Discharge Orders         Ordered    ondansetron (ZOFRAN ODT) 4 MG disintegrating tablet  Every 8 hours PRN        01/02/20 1927    levofloxacin (LEVAQUIN) 500 MG tablet  Daily        01/02/20 1927           Tedd Sias, [place] 01/02/20 1945    Gareth Morgan, MD 01/03/20 1217

## 2020-01-02 NOTE — Discharge Instructions (Addendum)
I discussed your  case with our on-call orthopedic doctor with [facility]. Please follow-up with Dr. Lucia Gaskins. Please call the office 1 week appointment.  You may inform them that your case was discussed with Dr. Lynann Bologna while you were here in the ER.  Please take the antibiotic you are prescribed.  I recommend taking the antibiotic for full stomach as it may cause some nausea.  If you are not having significant continued nausea you may use Zofran as needed.  You have had your first dose in the ER. Your next dose of antibiotic is due tomorrow morning then daily after that.

## 2020-01-02 NOTE — Telephone Encounter (Signed)
A Physician Assistant [phone], Gershon Cull called from [facility] stating patient has a foot infection and they need the on call Provider to call asap.

## 2020-01-02 NOTE — Telephone Encounter (Signed)
No he was discharged from the ER.

## 2020-01-02 NOTE — ED Triage Notes (Signed)
Patient seen x1 week ago after left foot injury. Dx with cellulitis. Reports taking abx as prescribed. States worsening redness and swelling up leg. Ambulatory.

## 2020-01-03 MED FILL — levoFLOXacin 500 MG TABS: 500 | 7 days supply | Qty: 7 | Fill #0

## 2020-01-03 MED FILL — ONDANSETRON ODT 4 MG TABLET: 4 | 8 days supply | Qty: 8 | Fill #0

## 2020-01-03 NOTE — Telephone Encounter (Signed)
Im going to have Estill Bamberg schedule this patient for a f/u appointment with Dr. Posey Pronto.

## 2020-01-03 NOTE — Telephone Encounter (Signed)
He has 2 ER visits for infection this month. Please have him follow up with someone this week if able for infection.

## 2020-01-03 NOTE — Telephone Encounter (Signed)
Pt is scheduled for 12/22

## 2020-01-04 ENCOUNTER — Ambulatory Visit: Payer: Medicare Other | Admitting: Podiatry

## 2020-01-04 ENCOUNTER — Other Ambulatory Visit: Payer: Self-pay

## 2020-01-04 ENCOUNTER — Other Ambulatory Visit: Payer: Self-pay | Admitting: Podiatry

## 2020-01-04 DIAGNOSIS — E1142 Type 2 diabetes mellitus with diabetic polyneuropathy: Secondary | ICD-10-CM | POA: Diagnosis not present

## 2020-01-04 DIAGNOSIS — M869 Osteomyelitis, unspecified: Secondary | ICD-10-CM

## 2020-01-04 DIAGNOSIS — L089 Local infection of the skin and subcutaneous tissue, unspecified: Secondary | ICD-10-CM | POA: Diagnosis not present

## 2020-01-04 MED ORDER — DOXYCYCLINE HYCLATE 100 MG PO TABS: 100.0000 mg | ORAL_TABLET | Freq: Two times a day (BID) | ORAL | 0 refills | Status: DC

## 2020-01-04 MED FILL — DOXYCYCLINE HYCLATE 100 MG: 100 | 10 days supply | Qty: 20 | Fill #0

## 2020-01-05 DIAGNOSIS — Z03818 Encounter for observation for suspected exposure to other biological agents ruled out: Secondary | ICD-10-CM | POA: Diagnosis not present

## 2020-01-09 ENCOUNTER — Encounter: Payer: Self-pay | Admitting: Podiatry

## 2020-01-09 NOTE — Progress Notes (Signed)
Subjective:  Patient ID: [name] [name], male    DOB: [birth year],  MRN: [id number]  Chief Complaint  Patient presents with  . Foot Injury    PT STATES HE HAS AN INFECTION AFTER STEPPING ON SCREW    59 y.o. male presents with the above complaint. Patient presents with complaint left second digit soft tissue infection as well as a possible underlying osteomyelitis. Patient has been to the emergency room multiple times and has followed up for this infection. Patient stepped on a screw and had it pulled out which cut some of the soft tissue off leading to an infection. Patient took some antibiotics but has not helped. He was discharged from the emergency room multiple times.   Review of Systems: Negative except as noted in the HPI. Denies N/V/F/Ch.  Past Medical History:  Diagnosis Date  . Anxiety   . Arthritis   . Bipolar 1 disorder ([place])   . Blood transfusion without reported diagnosis   . Cataract   . Chronic pain   . Depressed bipolar disorder ([place])   . Depression   . Diabetes mellitus without complication ([place])   . Diabetic neuropathy ([place])   . GERD (gastroesophageal reflux disease)   . Herpes zoster 12/05/2008   Qualifier: Diagnosis of  By: Ronnald Ramp MD, Arvid Right.   . High cholesterol   . 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  Coupled with high doses of loperamide used for pain control.  Unintentional overdose -intractable VT leading to cardiogenic shock - ECMO  . Hyperlipidemia   . Hypertension   . Neuromuscular disorder ([place])    nerve damage back, neck, and shoulder  . Neuropathy in diabetes ([place])   . Sleep apnea    has CPAP but cannot use it  . Substance abuse ([place])    in past     Current Outpatient Medications:  .  amitriptyline (ELAVIL) 50 MG tablet, Take 50 mg by mouth at bedtime., Disp: , Rfl:  .  atorvastatin (LIPITOR) 40 MG tablet, TAKE 1 TABLET BY MOUTH DAILY AT 6 PM., Disp: 30 tablet, Rfl:  5 .  buprenorphine (SUBUTEX) 8 MG SUBL SL tablet, Place under the tongue daily. 3 tabs daily, Disp: , Rfl:  .  doxycycline (VIBRA-TABS) 100 MG tablet, Take 1 tablet (100 mg total) by mouth 2 (two) times daily., Disp: 20 tablet, Rfl: 0 .  doxycycline (VIBRAMYCIN) 100 MG capsule, Take 1 capsule (100 mg total) by mouth 2 (two) times daily., Disp: 20 capsule, Rfl: 0 .  Dulaglutide (TRULICITY) [id number] [id number] SOPN, Inject 0.75 mg into the skin once a week., Disp: 2 mL, Rfl: 2 .  fenofibrate (TRICOR) 145 MG tablet, Take 1 tablet (145 mg total) by mouth daily., Disp: 30 tablet, Rfl: 5 .  hydrochlorothiazide (HYDRODIURIL) 25 MG tablet, Take 1 tablet (25 mg total) by mouth daily., Disp: 30 tablet, Rfl: 5 .  Lancets (FREESTYLE) lancets, Use as instructed, Disp: , Rfl:  .  levofloxacin (LEVAQUIN) 500 MG tablet, Take 1 tablet (500 mg total) by mouth daily., Disp: 7 tablet, Rfl: 0 .  losartan (COZAAR) 100 MG tablet, Take 1 tablet (100 mg total) by mouth daily., Disp: 30 tablet, Rfl: 5 .  metFORMIN (GLUCOPHAGE-XR) 500 MG 24 hr tablet, Take 2 tablets (1,000 mg total) by mouth 2 (two) times daily., Disp: 120 tablet, Rfl: 5 .  omeprazole (PRILOSEC) 40 MG capsule, Take 40 mg by mouth daily., Disp: , Rfl:  .  ondansetron (ZOFRAN ODT) 4 MG disintegrating tablet, Take 1 tablet (4 mg total) by mouth every 8 (eight) hours as needed for nausea or vomiting., Disp: 8 tablet, Rfl: 0 .  prazosin (MINIPRESS) 2 MG capsule, Take 2 mg by mouth at bedtime., Disp: , Rfl:  .  pregabalin (LYRICA) 300 MG capsule, Take 300 mg by mouth daily., Disp: , Rfl:  .  QUEtiapine (SEROQUEL) 300 MG tablet, Take 150 mg by mouth at bedtime., Disp: , Rfl:  .  Syringe/Needle, Disp, 30G X 1/2" 1 ML MISC, UAD - use as directed, Disp: 100 each, Rfl: 5  Social History   Tobacco Use  Smoking Status Never Smoker  Smokeless Tobacco Never Used    Allergies  Allergen Reactions  . Heparin Other (See Comments)    HIT Ab negative on 02/20/15, but SRA  POSITIVE   . Oxytetracycline Rash and Other (See Comments)  . Sulfa Antibiotics Other (See Comments)  . Del-Mycin [Erythromycin]     All mycin drugs  . Other     Mycins  . Sulfa Antibiotics   . Heparin Rash  . Sulfonamide Derivatives Rash   Objective:  There were no vitals filed for this visit. There is no height or weight on file to calculate BMI. Constitutional Well developed. Well nourished.  Vascular Dorsalis pedis pulses palpable bilaterally. Posterior tibial pulses palpable bilaterally. Capillary refill normal to all digits.  No cyanosis or clubbing noted. Pedal hair growth normal.  Neurologic Normal speech. Oriented to person, place, and time. Epicritic sensation to light touch grossly present bilaterally.  Dermatologic Nails well groomed and normal in appearance. No open wounds. No skin lesions.  Orthopedic:  Left second digit ulceration probes down to deep tissue/bone. No purulent drainage expressed. No malodor present. Fibrogranular wound base   Radiographs: 3 views of skeletally mature adult left foot concern for possible osteomyelitis of the middle phalanx as taken by the x-ray done at the hospital Assessment:   1. Diabetic polyneuropathy associated with type 2 diabetes mellitus ([place])   2. Soft tissue infection of foot   3. Osteomyelitis of second toe of left foot ([place])    Plan:  Patient was evaluated and treated and all questions answered.  Left second digit skin and soft tissue infection with concern for possible underlying osteomyelitis. -I discussed with the patient the etiology of infection given that he is diabetic with last A1c of 9.1 he is a high risk of losing the second digit. I discussed this in extensive detail with the patient. Patient would like to try conservative treatment options with antibiotics for now however I discussed with the patient if the redness gets worse to go to the emergency room right away for possible amputation of the second digit  and IV antibiotics. Patient agrees with the plan. -If there is no improvement when I see him back again he will need to go to the emergency room for IV antibiotics and an MRI of evaluation. Patient agrees with the plan -Doxycycline was dispensed -Surgical shoe was dispensed  No follow-ups on file.

## 2020-01-11 ENCOUNTER — Encounter: Payer: Self-pay | Admitting: Podiatry

## 2020-01-11 ENCOUNTER — Other Ambulatory Visit: Payer: Self-pay | Admitting: Podiatry

## 2020-01-11 ENCOUNTER — Ambulatory Visit: Payer: Medicare Other | Admitting: Podiatry

## 2020-01-11 ENCOUNTER — Other Ambulatory Visit: Payer: Self-pay

## 2020-01-11 DIAGNOSIS — E1142 Type 2 diabetes mellitus with diabetic polyneuropathy: Secondary | ICD-10-CM | POA: Diagnosis not present

## 2020-01-11 DIAGNOSIS — M869 Osteomyelitis, unspecified: Secondary | ICD-10-CM | POA: Diagnosis not present

## 2020-01-11 DIAGNOSIS — L089 Local infection of the skin and subcutaneous tissue, unspecified: Secondary | ICD-10-CM | POA: Diagnosis not present

## 2020-01-11 MED ORDER — DOXYCYCLINE HYCLATE 100 MG PO TABS: 100.0000 mg | ORAL_TABLET | Freq: Two times a day (BID) | ORAL | 0 refills | Status: DC

## 2020-01-11 MED ORDER — ONDANSETRON HCL 8 MG PO TABS: 8.0000 mg | ORAL_TABLET | Freq: Three times a day (TID) | ORAL | 0 refills | Status: DC | PRN

## 2020-01-11 MED FILL — ONDANSETRON HCL 8 MG TABLET: 8 | 6 days supply | Qty: 20 | Fill #0

## 2020-01-11 NOTE — Progress Notes (Signed)
Subjective:  Patient ID: [name] [name], male    DOB: [birth year],  MRN: [id number]  Chief Complaint  Patient presents with  . Wound Check    Left foot first and second toe has open area and the 2nd toe has some bleeding     59 y.o. male presents with the above complaint.  Patient presents with follow-up of left second digit soft tissue infection with possible underlying osteomyelitis.  Patient states that he did well on doxycycline the redness is improved considerably.  He has been able to tolerate the medication however he is feeling little nauseated would like to know if he can get Zofran for it.  He denies any other acute complaints.   Review of Systems: Negative except as noted in the HPI. Denies N/V/F/Ch.  Past Medical History:  Diagnosis Date  . Anxiety   . Arthritis   . Bipolar 1 disorder ([place])   . Blood transfusion without reported diagnosis   . Cataract   . Chronic pain   . Depressed bipolar disorder ([place])   . Depression   . Diabetes mellitus without complication ([place])   . Diabetic neuropathy ([place])   . GERD (gastroesophageal reflux disease)   . Herpes zoster 12/05/2008   Qualifier: Diagnosis of  By: Ronnald Ramp MD, Arvid Right.   . High cholesterol   . 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  Coupled with high doses of loperamide used for pain control.  Unintentional overdose -intractable VT leading to cardiogenic shock - ECMO  . Hyperlipidemia   . Hypertension   . Neuromuscular disorder ([place])    nerve damage back, neck, and shoulder  . Neuropathy in diabetes ([place])   . Sleep apnea    has CPAP but cannot use it  . Substance abuse ([place])    in past     Current Outpatient Medications:  .  doxycycline (VIBRA-TABS) 100 MG tablet, Take 1 tablet (100 mg total) by mouth 2 (two) times daily., Disp: 20 tablet, Rfl: 0 .  ondansetron (ZOFRAN) 8 MG tablet, Take 1 tablet (8 mg total) by mouth every 8 (eight) hours  as needed for nausea or vomiting., Disp: 20 tablet, Rfl: 0 .  amitriptyline (ELAVIL) 50 MG tablet, Take 50 mg by mouth at bedtime., Disp: , Rfl:  .  atorvastatin (LIPITOR) 40 MG tablet, TAKE 1 TABLET BY MOUTH DAILY AT 6 PM., Disp: 30 tablet, Rfl: 5 .  buprenorphine (SUBUTEX) 8 MG SUBL SL tablet, Place under the tongue daily. 3 tabs daily, Disp: , Rfl:  .  doxycycline (VIBRA-TABS) 100 MG tablet, Take 1 tablet (100 mg total) by mouth 2 (two) times daily., Disp: 20 tablet, Rfl: 0 .  doxycycline (VIBRAMYCIN) 100 MG capsule, Take 1 capsule (100 mg total) by mouth 2 (two) times daily., Disp: 20 capsule, Rfl: 0 .  Dulaglutide (TRULICITY) [id number] [id number] SOPN, Inject 0.75 mg into the skin once a week., Disp: 2 mL, Rfl: 2 .  fenofibrate (TRICOR) 145 MG tablet, Take 1 tablet (145 mg total) by mouth daily., Disp: 30 tablet, Rfl: 5 .  hydrochlorothiazide (HYDRODIURIL) 25 MG tablet, Take 1 tablet (25 mg total) by mouth daily., Disp: 30 tablet, Rfl: 5 .  Lancets (FREESTYLE) lancets, Use as instructed, Disp: , Rfl:  .  levofloxacin (LEVAQUIN) 500 MG tablet, Take 1 tablet (500 mg total) by mouth daily., Disp: 7 tablet, Rfl: 0 .  losartan (COZAAR) 100 MG tablet, Take 1 tablet (100 mg total)  by mouth daily., Disp: 30 tablet, Rfl: 5 .  metFORMIN (GLUCOPHAGE-XR) 500 MG 24 hr tablet, Take 2 tablets (1,000 mg total) by mouth 2 (two) times daily., Disp: 120 tablet, Rfl: 5 .  omeprazole (PRILOSEC) 40 MG capsule, Take 40 mg by mouth daily., Disp: , Rfl:  .  ondansetron (ZOFRAN ODT) 4 MG disintegrating tablet, Take 1 tablet (4 mg total) by mouth every 8 (eight) hours as needed for nausea or vomiting., Disp: 8 tablet, Rfl: 0 .  prazosin (MINIPRESS) 2 MG capsule, Take 2 mg by mouth at bedtime., Disp: , Rfl:  .  pregabalin (LYRICA) 300 MG capsule, Take 300 mg by mouth daily., Disp: , Rfl:  .  QUEtiapine (SEROQUEL) 300 MG tablet, Take 150 mg by mouth at bedtime., Disp: , Rfl:  .  Syringe/Needle, Disp, 30G X 1/2" 1 ML MISC, UAD -  use as directed, Disp: 100 each, Rfl: 5  Social History   Tobacco Use  Smoking Status Never Smoker  Smokeless Tobacco Never Used    Allergies  Allergen Reactions  . Heparin Other (See Comments)    HIT Ab negative on 02/20/15, but SRA POSITIVE   . Oxytetracycline Rash and Other (See Comments)  . Sulfa Antibiotics Other (See Comments)  . Del-Mycin [Erythromycin]     All mycin drugs  . Other     Mycins  . Sulfa Antibiotics   . Heparin Rash  . Sulfonamide Derivatives Rash   Objective:  There were no vitals filed for this visit. There is no height or weight on file to calculate BMI. Constitutional Well developed. Well nourished.  Vascular Dorsalis pedis pulses palpable bilaterally. Posterior tibial pulses palpable bilaterally. Capillary refill normal to all digits.  No cyanosis or clubbing noted. Pedal hair growth normal.  Neurologic Normal speech. Oriented to person, place, and time. Epicritic sensation to light touch grossly present bilaterally.  Dermatologic Nails well groomed and normal in appearance. No open wounds. No skin lesions.  Orthopedic:  Left second digit ulceration probes down to deep tissue/bone. No purulent drainage expressed. No malodor present. Fibrogranular wound base   Radiographs: 3 views of skeletally mature adult left foot concern for possible osteomyelitis of the middle phalanx as taken by the x-ray done at the hospital Assessment:   1. Diabetic polyneuropathy associated with type 2 diabetes mellitus ([place])   2. Soft tissue infection of foot   3. Osteomyelitis of second toe of left foot ([place])    Plan:  Patient was evaluated and treated and all questions answered.  Left second digit skin and soft tissue infection ~improving considerably -I discussed with the patient the etiology of infection given that he is diabetic with last A1c of 9.1 he is a high risk of losing the second digit. I discussed this in extensive detail with the patient.  -I  discussed with the patient in extensive detail that even though the redness is improving still not that out of the safe zone.  His second digit wound has also improved considerably.  We will continue Betadine wet-to-dry dressing changes continue utilizing the surgical shoe.  I will keep him on doxycycline indefinitely until further improvement.  Also prescribe Zofran for nausea secondary to taking doxycycline.  Patient states understanding and will do so. -I discussed with him that if the redness gets worse or extend past the first MPJ to go to the emergency room right away.  He states understanding.  He is still a high risk of losing the second digit as well as  foot or the leg.   No follow-ups on file.

## 2020-01-12 DIAGNOSIS — Z03818 Encounter for observation for suspected exposure to other biological agents ruled out: Secondary | ICD-10-CM | POA: Diagnosis not present

## 2020-01-17 DIAGNOSIS — Z03818 Encounter for observation for suspected exposure to other biological agents ruled out: Secondary | ICD-10-CM | POA: Diagnosis not present

## 2020-01-20 ENCOUNTER — Ambulatory Visit (INDEPENDENT_AMBULATORY_CARE_PROVIDER_SITE_OTHER): Payer: Medicare Other | Admitting: Podiatry

## 2020-01-20 ENCOUNTER — Other Ambulatory Visit: Payer: Self-pay

## 2020-01-20 DIAGNOSIS — L97522 Non-pressure chronic ulcer of other part of left foot with fat layer exposed: Secondary | ICD-10-CM | POA: Diagnosis not present

## 2020-01-20 DIAGNOSIS — E1142 Type 2 diabetes mellitus with diabetic polyneuropathy: Secondary | ICD-10-CM | POA: Diagnosis not present

## 2020-01-24 ENCOUNTER — Encounter: Payer: Self-pay | Admitting: Podiatry

## 2020-01-24 NOTE — Progress Notes (Signed)
Subjective:  Patient ID: [name] [name], male    DOB: [birth year],  MRN: [id number]  Chief Complaint  Patient presents with  . Wound Check    Left foot wound check. Pt stated that he is doing really well. The wound looks really good his only concern is that the toe is still a little swollen.    60 y.o. male presents with the above complaint.  Patient presents with follow-up of left second digit soft tissue infection with possible underlying osteomyelitis.  Patient states that he did well on doxycycline the redness is improved considerably.  He has been able to tolerate the medication however he is feeling little nauseated would like to know if he can get Zofran for it.  He denies any other acute complaints.   Review of Systems: Negative except as noted in the HPI. Denies N/V/F/Ch.  Past Medical History:  Diagnosis Date  . Anxiety   . Arthritis   . Bipolar 1 disorder ([place])   . Blood transfusion without reported diagnosis   . Cataract   . Chronic pain   . Depressed bipolar disorder ([place])   . Depression   . Diabetes mellitus without complication ([place])   . Diabetic neuropathy ([place])   . GERD (gastroesophageal reflux disease)   . Herpes zoster 12/05/2008   Qualifier: Diagnosis of  By: Ronnald Ramp MD, Arvid Right.   . High cholesterol   . 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  Coupled with high doses of loperamide used for pain control.  Unintentional overdose -intractable VT leading to cardiogenic shock - ECMO  . Hyperlipidemia   . Hypertension   . Neuromuscular disorder ([place])    nerve damage back, neck, and shoulder  . Neuropathy in diabetes ([place])   . Sleep apnea    has CPAP but cannot use it  . Substance abuse ([place])    in past     Current Outpatient Medications:  .  amitriptyline (ELAVIL) 50 MG tablet, Take 50 mg by mouth at bedtime., Disp: , Rfl:  .  atorvastatin (LIPITOR) 40 MG tablet, TAKE 1 TABLET BY MOUTH  DAILY AT 6 PM., Disp: 30 tablet, Rfl: 5 .  buprenorphine (SUBUTEX) 8 MG SUBL SL tablet, Place under the tongue daily. 3 tabs daily, Disp: , Rfl:  .  doxycycline (VIBRA-TABS) 100 MG tablet, Take 1 tablet (100 mg total) by mouth 2 (two) times daily., Disp: 20 tablet, Rfl: 0 .  doxycycline (VIBRA-TABS) 100 MG tablet, Take 1 tablet (100 mg total) by mouth 2 (two) times daily., Disp: 20 tablet, Rfl: 0 .  doxycycline (VIBRAMYCIN) 100 MG capsule, Take 1 capsule (100 mg total) by mouth 2 (two) times daily., Disp: 20 capsule, Rfl: 0 .  Dulaglutide (TRULICITY) 7.62 GB/1.5VV SOPN, Inject 0.75 mg into the skin once a week., Disp: 2 mL, Rfl: 2 .  fenofibrate (TRICOR) 145 MG tablet, Take 1 tablet (145 mg total) by mouth daily., Disp: 30 tablet, Rfl: 5 .  hydrochlorothiazide (HYDRODIURIL) 25 MG tablet, Take 1 tablet (25 mg total) by mouth daily., Disp: 30 tablet, Rfl: 5 .  Lancets (FREESTYLE) lancets, Use as instructed, Disp: , Rfl:  .  levofloxacin (LEVAQUIN) 500 MG tablet, Take 1 tablet (500 mg total) by mouth daily., Disp: 7 tablet, Rfl: 0 .  losartan (COZAAR) 100 MG tablet, Take 1 tablet (100 mg total) by mouth daily., Disp: 30 tablet, Rfl: 5 .  metFORMIN (GLUCOPHAGE-XR) 500 MG 24 hr tablet, Take  2 tablets (1,000 mg total) by mouth 2 (two) times daily., Disp: 120 tablet, Rfl: 5 .  omeprazole (PRILOSEC) 40 MG capsule, Take 40 mg by mouth daily., Disp: , Rfl:  .  ondansetron (ZOFRAN ODT) 4 MG disintegrating tablet, Take 1 tablet (4 mg total) by mouth every 8 (eight) hours as needed for nausea or vomiting., Disp: 8 tablet, Rfl: 0 .  ondansetron (ZOFRAN) 8 MG tablet, Take 1 tablet (8 mg total) by mouth every 8 (eight) hours as needed for nausea or vomiting., Disp: 20 tablet, Rfl: 0 .  prazosin (MINIPRESS) 2 MG capsule, Take 2 mg by mouth at bedtime., Disp: , Rfl:  .  pregabalin (LYRICA) 300 MG capsule, Take 300 mg by mouth daily., Disp: , Rfl:  .  QUEtiapine (SEROQUEL) 300 MG tablet, Take 150 mg by mouth at  bedtime., Disp: , Rfl:  .  Syringe/Needle, Disp, 30G X 1/2" 1 ML MISC, UAD - use as directed, Disp: 100 each, Rfl: 5  Social History   Tobacco Use  Smoking Status Never Smoker  Smokeless Tobacco Never Used    Allergies  Allergen Reactions  . Heparin Other (See Comments)    HIT Ab negative on 02/20/15, but SRA POSITIVE   . Oxytetracycline Rash and Other (See Comments)  . Sulfa Antibiotics Other (See Comments)  . Del-Mycin [Erythromycin]     All mycin drugs  . Other     Mycins  . Sulfa Antibiotics   . Heparin Rash  . Sulfonamide Derivatives Rash   Objective:  There were no vitals filed for this visit. There is no height or weight on file to calculate BMI. Constitutional Well developed. Well nourished.  Vascular Dorsalis pedis pulses palpable bilaterally. Posterior tibial pulses palpable bilaterally. Capillary refill normal to all digits.  No cyanosis or clubbing noted. Pedal hair growth normal.  Neurologic Normal speech. Oriented to person, place, and time. Epicritic sensation to light touch grossly present bilaterally.  Dermatologic Nails well groomed and normal in appearance. No open wounds. No skin lesions.  Orthopedic:  Left second digit ulceration probes down to deep tissue/bone. No purulent drainage expressed. No malodor present. Fibrogranular wound base   Radiographs: 3 views of skeletally mature adult left foot concern for possible osteomyelitis of the middle phalanx as taken by the x-ray done at the hospital Assessment:   1. Diabetic polyneuropathy associated with type 2 diabetes mellitus ([place])   2. Toe ulcer, left, with fat layer exposed ([place])    Plan:  Patient was evaluated and treated and all questions answered.  Left second digit skin and soft tissue infection ~improving considerably -I discussed with the patient the etiology of infection given that he is diabetic with last A1c of 9.1 he is a high risk of losing the second digit. I discussed this in  extensive detail with the patient.  -I discussed with the patient in extensive detail that even though the redness is improving still not that out of the safe zone.  His second digit wound has also improved considerably.  We will continue Betadine wet-to-dry dressing changes continue utilizing the surgical shoe.  I will keep him on doxycycline indefinitely until further improvement.  Also prescribe Zofran for nausea secondary to taking doxycycline.  Patient states understanding and will do so. -I discussed with him that if the redness gets worse or extend past the first MPJ to go to the emergency room right away.  He states understanding.  He is still a high risk of losing the second  digit as well as foot or the leg.  Ulcer left second digit with fat layer exposed -Debridement as below. -Dressed with Betadine wet-to-dry, DSD. -Continue off-loading with surgical shoe.  Procedure: Excisional Debridement of Wound Tool: Sharp chisel blade/tissue nipper Rationale: Removal of non-viable soft tissue from the wound to promote healing.  Anesthesia: none Pre-Debridement Wound Measurements: 0.8 cm x 0.3 cm x 0.3 cm  Post-Debridement Wound Measurements: 0.9 cm x 0.5 cm x 0.3 cm  Type of Debridement: Sharp Excisional Tissue Removed: Non-viable soft tissue Blood loss: Minimal (<50cc) Depth of Debridement: subcutaneous tissue. Technique: Sharp excisional debridement to bleeding, viable wound base.  Wound Progress: The wound is decreasing from initial evaluation Site healing conversation 7 Dressing: Dry, sterile, compression dressing. Disposition: Patient tolerated procedure well. Patient to return in 1 week for follow-up.  No follow-ups on file.

## 2020-01-25 ENCOUNTER — Other Ambulatory Visit: Payer: Self-pay | Admitting: Podiatry

## 2020-01-25 ENCOUNTER — Telehealth: Payer: Self-pay | Admitting: Urology

## 2020-01-25 MED ORDER — DOXYCYCLINE HYCLATE 100 MG PO TABS: 100.0000 mg | ORAL_TABLET | Freq: Two times a day (BID) | ORAL | 0 refills | Status: DC

## 2020-01-25 MED ORDER — ONDANSETRON HCL 4 MG PO TABS: 4.0000 mg | ORAL_TABLET | Freq: Three times a day (TID) | ORAL | 0 refills | Status: DC | PRN

## 2020-01-25 MED FILL — DOXYCYCLINE HYCLATE 100 MG: 100 | 10 days supply | Qty: 20 | Fill #0

## 2020-01-25 MED FILL — ONDANSETRON HCL 4 MG TABLET: 4 | 6 days supply | Qty: 20 | Fill #0

## 2020-01-25 NOTE — Telephone Encounter (Signed)
PT needs a refill on ondansetron, abx doxycycline. Please Advise. Thank you

## 2020-01-25 NOTE — Telephone Encounter (Signed)
done

## 2020-01-26 ENCOUNTER — Encounter (HOSPITAL_BASED_OUTPATIENT_CLINIC_OR_DEPARTMENT_OTHER): Payer: Medicare Other | Attending: Internal Medicine | Admitting: Internal Medicine

## 2020-01-31 DIAGNOSIS — M545 Low back pain, unspecified: Secondary | ICD-10-CM | POA: Diagnosis not present

## 2020-02-09 DIAGNOSIS — M545 Low back pain, unspecified: Secondary | ICD-10-CM | POA: Diagnosis not present

## 2020-02-09 MED FILL — metFORMIN HCL ER 500 MG TB2: 500 | 30 days supply | Qty: 120 | Fill #5

## 2020-02-10 ENCOUNTER — Ambulatory Visit: Payer: Medicare Other | Admitting: Podiatry

## 2020-02-10 ENCOUNTER — Other Ambulatory Visit: Payer: Self-pay

## 2020-02-13 ENCOUNTER — Other Ambulatory Visit: Payer: Self-pay | Admitting: Internal Medicine

## 2020-02-13 ENCOUNTER — Encounter: Payer: Self-pay | Admitting: Internal Medicine

## 2020-02-13 ENCOUNTER — Ambulatory Visit (INDEPENDENT_AMBULATORY_CARE_PROVIDER_SITE_OTHER): Payer: Medicare Other | Admitting: Internal Medicine

## 2020-02-13 ENCOUNTER — Other Ambulatory Visit: Payer: Self-pay

## 2020-02-13 VITALS — BP 103/67 | HR 59 | Temp 98.1°F | Resp 18 | Ht 74.0 in | Wt 242.4 lb

## 2020-02-13 DIAGNOSIS — E1142 Type 2 diabetes mellitus with diabetic polyneuropathy: Secondary | ICD-10-CM

## 2020-02-13 DIAGNOSIS — I10 Essential (primary) hypertension: Secondary | ICD-10-CM | POA: Diagnosis not present

## 2020-02-13 LAB — POCT GLYCOSYLATED HEMOGLOBIN (HGB A1C): Hemoglobin A1C: 7.1 % — AB (ref 4.0–5.6)

## 2020-02-13 LAB — GLUCOSE, POCT (MANUAL RESULT ENTRY): POC Glucose: 225 mg/dl — AB (ref 70–99)

## 2020-02-13 MED ORDER — METFORMIN HCL ER 500 MG PO TB24: 1000.0000 mg | ORAL_TABLET | Freq: Two times a day (BID) | ORAL | 5 refills | Status: DC

## 2020-02-13 NOTE — Progress Notes (Signed)
Needs RF on metformin

## 2020-02-13 NOTE — Progress Notes (Signed)
Subjective:    [name] [name] - 60 y.o. male MRN [id number]  Date of birth: 09-Sep-1960  HPI  [name] [name] is here for follow up on chronic medical conditions. Tells me about his toe infection related to stepping on screw while doing yard work. Was seen at [facility] and ED and treated for soft tissue infection. Ended up seeing podiatry. Now healed.   Diabetes mellitus, Type 2 Disease Monitoring             Blood Sugar Ranges: Fasting - Mostly 130-150s;  Random - Low 200s.             Polyuria: no             Visual problems: no   Urine Microalbumin 8 (April 2021) --on Arb   Last A1C: 9.5 (11/30/19)   Medications: Trulicity 2.69 mg weekly, Metformin 1000 mg BID Medication Compliance: yes  Medication Side Effects             Hypoglycemia: no   Chronic HTN Disease Monitoring:  Home BP Monitoring - <130/80s  Chest pain- no  Dyspnea- no Headache - no  Medications: Losartan 100 mg, HCTZ 25 mg  Compliance- yes Lightheadedness- no  Edema- no            Health Maintenance:  Health Maintenance Due  Topic Date Due  . COLONOSCOPY (Pts 45-36yrs Insurance coverage will need to be confirmed)  08/31/2015  . OPHTHALMOLOGY EXAM  09/04/2017  . COVID-19 Vaccine (2 - Booster for Janssen series) 10/03/2019    -  reports that he has never smoked. He has never used smokeless tobacco. - Review of Systems: Per HPI. - Past Medical History: Patient Active Problem List   Diagnosis Date Noted  . Skin lesion 12/07/2019  . Chronic pain disorder 09/13/2019  . DDD (degenerative disc disease), lumbar 09/13/2019  . Dyslipidemia 09/13/2019  . Obesity 09/13/2019  . Peripheral neuropathy 09/13/2019  . Sleep apnea   . Prolonged Q-T interval on ECG 12/03/2016  . Drug-induced torsades de pointes ([place]) 12/03/2016  . Thrombocytopenia ([place]) 11/29/2016  . Personal history of ECMO 11/26/2016  . Personal history of spine surgery 11/26/2016  . S/P rotator cuff repair 11/26/2016   . 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 Mild concentric left ventricular hypertrophy (LVH) 09/11/2016  . DJD (degenerative joint disease) of cervical spine 02/26/2016  . Tinea pedis of both feet 06/03/2015  . Pre-ulcerative calluses 06/01/2015  . Type 2 diabetes mellitus with diabetic polyneuropathy ([place]) 06/09/2014  . Generalized anxiety disorder 06/19/2012  . Herpes zoster 12/05/2008  . Mixed hyperlipidemia 05/11/2008  . DEPRESSION 05/11/2008  . Essential hypertension 05/11/2008  . GERD 05/11/2008  . OSTEOARTHRITIS 05/11/2008  . LOW BACK PAIN 05/11/2008  . COLONIC POLYPS, HX OF 05/11/2008   - Medications: reviewed and updated   Objective:   Physical Exam BP 103/67 (BP Location: Right Arm, Patient Position: Sitting, Cuff Size: Large)   Pulse (!) 59   Temp 98.1 F (36.7 C) (Oral)   Resp 18   Ht 6\' 2"  (1.88 m)   Wt 242 lb 6.4 oz (110 kg)   SpO2 97%   BMI 31.12 kg/m  Physical Exam Constitutional:      General: He is not in acute distress.    Appearance: He is not diaphoretic.  Cardiovascular:     Rate and Rhythm: Normal rate.  Pulmonary:     Effort: Pulmonary effort is normal. No respiratory distress.  Musculoskeletal:        General: Normal range of motion.  Skin:    General: Skin is warm and dry.  Neurological:     Mental Status: He is alert and oriented to person, place, and time.  Psychiatric:        Mood and Affect: Affect normal.        Judgment: Judgment normal.            Assessment & Plan:   1. Type 2 diabetes mellitus with diabetic polyneuropathy, unspecified whether long term insulin use (HCC) A1c 7.1, significantly improved. Congratulated patient. He is now compliant with Trulicity and working on diet. Continue to monitor CBGs. Follow up in 3 months.  - Glucose (CBG) - metFORMIN (GLUCOPHAGE-XR) 500 MG 24 hr tablet; Take 2 tablets (1,000 mg total) by mouth 2 (two) times daily.  Dispense: 120 tablet;  Refill: 5 - HgB A1c - Ambulatory referral to Ophthalmology  2. Essential hypertension BP at goal. Continue current regimen. Asymptomatic.    Phill Myron, D.O. 02/13/2020, 9:33 AM Primary Care at [facility]

## 2020-02-16 DIAGNOSIS — M545 Low back pain, unspecified: Secondary | ICD-10-CM | POA: Diagnosis not present

## 2020-02-17 ENCOUNTER — Ambulatory Visit: Payer: Medicare Other | Admitting: Podiatry

## 2020-02-28 DIAGNOSIS — M545 Low back pain, unspecified: Secondary | ICD-10-CM | POA: Diagnosis not present

## 2020-03-01 MED FILL — HYDROCHLOROTHIAZIDE 25 MG T: 25 | 30 days supply | Qty: 30 | Fill #2

## 2020-03-01 MED FILL — FENOFIBRATE 145 MG TABLET: 145 | 30 days supply | Qty: 30 | Fill #4

## 2020-03-01 MED FILL — LOSARTAN POTASSIUM 100 MG T: 100 | 30 days supply | Qty: 30 | Fill #4

## 2020-03-01 MED FILL — ATORVASTATIN CALCIUM 40 MG: 40 | 30 days supply | Qty: 30 | Fill #4

## 2020-03-06 DIAGNOSIS — M545 Low back pain, unspecified: Secondary | ICD-10-CM | POA: Diagnosis not present

## 2020-04-04 ENCOUNTER — Ambulatory Visit: Payer: Medicare Other | Admitting: Podiatry

## 2020-04-14 ENCOUNTER — Other Ambulatory Visit: Payer: Self-pay

## 2020-04-23 ENCOUNTER — Encounter: Payer: Self-pay | Admitting: Emergency Medicine

## 2020-04-23 ENCOUNTER — Ambulatory Visit
Admission: EM | Admit: 2020-04-23 | Discharge: 2020-04-23 | Disposition: A | Discharge status: Other Acute Inpatient Hospital | Payer: Medicare Other | Attending: Emergency Medicine | Admitting: Emergency Medicine

## 2020-04-23 ENCOUNTER — Ambulatory Visit (INDEPENDENT_AMBULATORY_CARE_PROVIDER_SITE_OTHER): Payer: Medicare Other

## 2020-04-23 ENCOUNTER — Ambulatory Visit: Payer: Medicare Other

## 2020-04-23 ENCOUNTER — Other Ambulatory Visit: Payer: Self-pay

## 2020-04-23 DIAGNOSIS — M795 Residual foreign body in soft tissue: Secondary | ICD-10-CM | POA: Diagnosis not present

## 2020-04-23 DIAGNOSIS — M79672 Pain in left foot: Secondary | ICD-10-CM | POA: Diagnosis not present

## 2020-04-23 DIAGNOSIS — W450XXA Nail entering through skin, initial encounter: Secondary | ICD-10-CM

## 2020-04-23 DIAGNOSIS — Z189 Retained foreign body fragments, unspecified material: Secondary | ICD-10-CM

## 2020-04-23 DIAGNOSIS — S91332A Puncture wound without foreign body, left foot, initial encounter: Secondary | ICD-10-CM

## 2020-04-23 DIAGNOSIS — S91312A Laceration without foreign body, left foot, initial encounter: Secondary | ICD-10-CM | POA: Diagnosis not present

## 2020-04-23 NOTE — ED Triage Notes (Signed)
Patient presents to Urgent Care with complaints of left foot pain from stepping on a nail last night. Patient reports wearing crocs and loss his shoe due to Neuropathy. He noticed blood. Last Tdap 12/24/19

## 2020-04-23 NOTE — Discharge Instructions (Addendum)
No fracture to your heel bone but there does appear to be some debris deeper in the wound that will likely require surgical cleaning. Go to the ER to get the wound properly cleaned and likely started on antibiotics.

## 2020-04-23 NOTE — ED Provider Notes (Signed)
[name]    CSN: [id number] Arrival date & time: 04/23/20  1439      History   Chief Complaint Chief Complaint  Patient presents with  . Foot Injury    HPI [name] [name] is a 60 y.o. male.   Patient presents with concerns of right heel injury. He states last night he was intoxicated and stepped on a nail. He reports it went up through the Croc he was wearing and into his right heel. He states about 2 inches of the nail went into his foot. The patient has peripheral neuropathy with minimal sensation in his feet. He reports he has had pressure and discomfort with walking since the injury. He denies known fever but feels a bit unwell due to feeling "hungover". The patient reports a similar incident with stepping on a screw in December. His tetanus is up to date, last in December 2021. The patient does see a foot/ankle specialist from his prior injury and toenail problems. He has not taken or tried anything for the discomfort. He states he applied antibiotic spray and wrapped it up last night but has not changed the bandage since then, though it did bleed through the bandage somewhat. He does request anti-emetic if prescribed antibiotics as they tend to make him nauseous.   The history is provided by the patient.  Foot Injury Location:  Foot Tetanus status:  Up to date Associated symptoms: fatigue     Past Medical History:  Diagnosis Date  . Anxiety   . Arthritis   . Bipolar 1 disorder ([place])   . Blood transfusion without reported diagnosis   . Cataract   . Chronic pain   . Depressed bipolar disorder ([place])   . Depression   . Diabetes mellitus without complication ([place])   . Diabetic neuropathy ([place])   . GERD (gastroesophageal reflux disease)   . Herpes zoster 12/05/2008   Qualifier: Diagnosis of  By: Ronnald Ramp MD, Arvid Right.   . High cholesterol   . 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   Coupled with high doses of loperamide used for pain control.  Unintentional overdose -intractable VT leading to cardiogenic shock - ECMO  . Hyperlipidemia   . Hypertension   . Neuromuscular disorder ([place])    nerve damage back, neck, and shoulder  . Neuropathy in diabetes ([place])   . Sleep apnea    has CPAP but cannot use it  . Substance abuse ([place])    in past     Patient Active Problem List   Diagnosis Date Noted  . Skin lesion 12/07/2019  . Chronic pain disorder 09/13/2019  . DDD (degenerative disc disease), lumbar 09/13/2019  . Dyslipidemia 09/13/2019  . Obesity 09/13/2019  . Peripheral neuropathy 09/13/2019  . Sleep apnea   . Prolonged Q-T interval on ECG 12/03/2016  . Drug-induced torsades de pointes ([place]) 12/03/2016  . Thrombocytopenia ([place]) 11/29/2016  . Personal history of ECMO 11/26/2016  . Personal history of spine surgery 11/26/2016  . S/P rotator cuff repair 11/26/2016  . 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 Mild concentric left ventricular hypertrophy (LVH) 09/11/2016  . DJD (degenerative joint disease) of cervical spine 02/26/2016  . Tinea pedis of both feet 06/03/2015  . Pre-ulcerative calluses 06/01/2015  . Type 2 diabetes mellitus with diabetic polyneuropathy ([place]) 06/09/2014  . Generalized anxiety disorder 06/19/2012  . Herpes zoster 12/05/2008  . Mixed hyperlipidemia  05/11/2008  . DEPRESSION 05/11/2008  . Essential hypertension 05/11/2008  . GERD 05/11/2008  . OSTEOARTHRITIS 05/11/2008  . LOW BACK PAIN 05/11/2008  . COLONIC POLYPS, HX OF 05/11/2008    Past Surgical History:  Procedure Laterality Date  . COLONOSCOPY    . 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 INTRAOPERATIVE TRANSESOPHAGEAL ECHOCARDIOGRAM N/A 11/26/2016   Procedure: INTRAOPERATIVE TRANSESOPHAGEAL ECHOCARDIOGRAM;  Surgeon: Ivin Poot,  MD;  Location: [facility];  Service: Open Heart Surgery;  Laterality: N/A;  . POLYPECTOMY    . SHOULDER ARTHROSCOPY WITH ROTATOR CUFF REPAIR Right 2009  . [place]  2010  . TRANSTHORACIC ECHOCARDIOGRAM  07/2016; 12/10/2016   a. Prior to VT arrest: normal. EF 55-60%. Gr 1 DD.  mild LVH. Mildly dilated Aortic Root.;; b.  Normal LV size and function.  Mild LVH.  EF 55%.  No RWMA.  No valve abnormalities.       Home Medications    Prior to Admission medications   Medication Sig Start Date End Date Taking? Authorizing Provider  amitriptyline (ELAVIL) 50 MG tablet Take 50 mg by mouth at bedtime.    [provider]  atorvastatin (LIPITOR) 40 MG tablet TAKE 1 TABLET BY MOUTH DAILY AT 6 PM. 11/09/19   McClung, Dionne Bucy, PA-C  atorvastatin (LIPITOR) 40 MG tablet TAKE 1 TABLET BY MOUTH DAILY AT 6 PM. 07/25/19 07/24/20  Nicolette Bang, DO  buprenorphine (SUBUTEX) 8 MG SUBL SL tablet Place under the tongue daily. 3 tabs daily    [provider]  Dulaglutide (TRULICITY) 2.77 AJ/2.8NO SOPN Inject 0.75 mg into the skin once a week. 11/09/19   Argentina Donovan, PA-C  fenofibrate (TRICOR) 145 MG tablet Take 1 tablet (145 mg total) by mouth daily. 11/09/19   Argentina Donovan, PA-C  fenofibrate (TRICOR) 145 MG tablet TAKE 1 TABLET (145 MG TOTAL) BY MOUTH DAILY. 07/25/19 07/24/20  Nicolette Bang, DO  hydrochlorothiazide (HYDRODIURIL) 25 MG tablet TAKE 1 TABLET (25 MG TOTAL) BY MOUTH DAILY. 11/09/19 11/08/20  Argentina Donovan, PA-C  Lancets (FREESTYLE) lancets Use as instructed 05/24/14   [provider]  levofloxacin (LEVAQUIN) 500 MG tablet TAKE 1 TABLET (500 MG TOTAL) BY MOUTH DAILY. 01/02/20 01/01/21  Tedd Sias, PA  losartan (COZAAR) 100 MG tablet Take 1 tablet (100 mg total) by mouth daily. 11/09/19   Argentina Donovan, PA-C  losartan (COZAAR) 100 MG tablet TAKE 1 TABLET (100 MG TOTAL) BY MOUTH DAILY. 07/25/19 07/24/20  Nicolette Bang, DO   metFORMIN (GLUCOPHAGE-XR) 500 MG 24 hr tablet TAKE 2 TABLETS (1,000 MG TOTAL) BY MOUTH 2 (TWO) TIMES DAILY. 02/13/20 02/12/21  Nicolette Bang, DO  omeprazole (PRILOSEC) 40 MG capsule Take 40 mg by mouth daily.    [provider]  ondansetron (ZOFRAN) 4 MG tablet TAKE 1 TABLET (4 MG TOTAL) BY MOUTH EVERY 8 (EIGHT) HOURS AS NEEDED FOR NAUSEA OR VOMITING. 01/25/20 01/24/21  Felipa Furnace, DPM  ondansetron (ZOFRAN) 8 MG tablet TAKE 1 TABLET (8 MG TOTAL) BY MOUTH EVERY 8 (EIGHT) HOURS AS NEEDED FOR NAUSEA OR VOMITING. 01/11/20 01/10/21  Felipa Furnace, DPM  ondansetron (ZOFRAN-ODT) 4 MG disintegrating tablet TAKE 1 TABLET (4 MG TOTAL) BY MOUTH EVERY 8 (EIGHT) HOURS AS NEEDED FOR NAUSEA OR VOMITING. 01/02/20 01/01/21  Tedd Sias, PA  prazosin (MINIPRESS) 2 MG capsule Take 2 mg by mouth at bedtime.  [provider]  pregabalin (LYRICA) 300 MG capsule Take 300 mg by mouth daily.    [provider]  QUEtiapine (SEROQUEL) 300 MG tablet Take 150 mg by mouth at bedtime.    [provider]  Syringe/Needle, Disp, 30G X 1/2" 1 ML MISC UAD - use as directed 12/29/18   Ladell Pier, MD  sertraline (ZOLOFT) 100 MG tablet Take 100 mg by mouth daily.  11/09/19  [provider]    Family History Family History  Problem Relation Age of Onset  . Diabetes Mother   . Hyperlipidemia Mother   . Heart disease Father   . Colon cancer Neg Hx   . Colon polyps Neg Hx   . Esophageal cancer Neg Hx   . Rectal cancer Neg Hx   . Stomach cancer Neg Hx     Social History Social History   Tobacco Use  . Smoking status: Never Smoker  . Smokeless tobacco: Never Used  Substance Use Topics  . Alcohol use: Not Currently  . Drug use: No     Allergies   Heparin, Oxytetracycline, Sulfa antibiotics, Del-mycin [erythromycin], Other, Sulfa antibiotics, Heparin, and Sulfonamide derivatives   Review of Systems Review of Systems  Constitutional: Positive for  fatigue.  Respiratory: Negative for shortness of breath.   Cardiovascular: Negative for leg swelling.  Musculoskeletal: Positive for gait problem.  Skin: Positive for wound.  Neurological: Positive for numbness (chronic bilateral lower legs).     Physical Exam Triage Vital Signs ED Triage Vitals  Enc Vitals Group     BP 04/23/20 1504 (!) 190/83     Pulse Rate 04/23/20 1504 79     Resp 04/23/20 1504 18     Temp 04/23/20 1504 (!) 100.6 F (38.1 C)     Temp Source 04/23/20 1504 Oral     SpO2 04/23/20 1504 94 %     Weight --      Height --      Head Circumference --      Peak Flow --      Pain Score 04/23/20 1502 10     Pain Loc --      Pain Edu? --      Excl. in [place]? --    No data found.  Updated Vital Signs BP (!) 190/83 (BP Location: Left Arm)   Pulse 79   Temp (!) 100.6 F (38.1 C) (Oral)   Resp 18   SpO2 94%   Visual Acuity Right Eye Distance:   Left Eye Distance:   Bilateral Distance:    Right Eye Near:   Left Eye Near:    Bilateral Near:     Physical Exam Vitals and nursing note reviewed.  Constitutional:      General: He is not in acute distress. Eyes:     Pupils: Pupils are equal, round, and reactive to light.  Pulmonary:     Effort: Pulmonary effort is normal.  Musculoskeletal:     Left foot: Laceration present.       Feet:     Comments: Ragged laceration to left plantar heel. Surrounding heel callus moist and loosely attached with entire wound bed appearing ragged and unclean. Small clump of dark matter coming out of wound but easily removed. No active bleeding. Patient reports mild pressure to the area, no notable discomfort or pain due to neuropathy.   Neurological:     General: No focal deficit present.     Mental Status: He is alert.  UC Treatments / Results  Labs (all labs ordered are listed, but only abnormal results are displayed) Labs Reviewed - No data to display  EKG   Radiology DG Foot Complete Left  Result Date:  04/23/2020 CLINICAL DATA:  Heel pain after stepping on a nail EXAM: LEFT FOOT - COMPLETE 3+ VIEW COMPARISON:  Prior radiographs of the left foot 01/02/2020 FINDINGS: Soft tissue laceration overlies the left heel. Multiple tiny radiopaque foreign bodies are present within the soft tissues consistent with retained foreign bodies. The largest measures less than 1 mm in size. No evidence of acute fracture or malalignment. Degenerative changes again noted throughout the midfoot. IMPRESSION: 1. Multiple tiny sub-millimeter radiopacities noted in the soft tissues overlying the plantar aspect of the heel in the region of the puncture wound consistent with tiny foreign bodies. 2. No evidence of acute fracture. Electronically Signed   By: Jacqulynn Cadet M.D.   On: 04/23/2020 16:07    Procedures Wound Care  Date/Time: 04/23/2020 3:43 PM Performed by: Delsa Sale, PA Authorized by: Delsa Sale, PA   Consent:    Consent obtained:  Verbal   Consent given by:  Patient   Risks, benefits, and alternatives were discussed: yes     Risks discussed:  Pain and bleeding Anesthesia:    Anesthesia method:  None Procedure details:    Wound location:  Foot   Foot location:  L heel   Wound age (days):  <1 Dressing:    Dressing applied:  Xeroform 1x8 and Telfa pad   Wrapped with:  Elastic bandage 2 inch Post-procedure details:    Procedure completion:  Tolerated   (including critical care time)  Medications Ordered in UC Medications - No data to display  Initial Impression / Assessment and Plan / UC Course  I have reviewed the triage vital signs and the nursing notes.  Pertinent labs & imaging results that were available during my care of the patient were reviewed by me and considered in my medical decision making (see chart for details).     Complicated puncture wound due to depth and diabetic neuropathy. Tetanus up to date. X-ray shows no fracture but multiple small retained foreign bodies. High  risk for complications and concern for lack of close follow-up or wound care available given patient circumstances. Uncomfortable starting on oral cipro or levaquin due to prolonged QT. Discussed with patient recommendation to go to the ER to get surgical debridement and likely started on IV antibiotics.   Final Clinical Impressions(s) / UC Diagnoses   Final diagnoses:  Puncture wound of left heel, initial encounter  Retained foreign body     Discharge Instructions     No fracture to your heel bone but there does appear to be some debris deeper in the wound that will likely require surgical cleaning. Go to the ER to get the wound properly cleaned and likely started on antibiotics.     ED Prescriptions    None     PDMP not reviewed this encounter.   Delsa Sale, [place] 04/23/20 1627

## 2020-04-24 ENCOUNTER — Ambulatory Visit: Payer: Medicare Other | Admitting: Podiatry

## 2020-04-24 ENCOUNTER — Encounter: Payer: Self-pay | Admitting: Podiatry

## 2020-04-24 VITALS — BP 184/82 | HR 73 | Temp 99.1°F | Resp 16

## 2020-04-24 DIAGNOSIS — L97423 Non-pressure chronic ulcer of left heel and midfoot with necrosis of muscle: Secondary | ICD-10-CM

## 2020-04-24 DIAGNOSIS — E1142 Type 2 diabetes mellitus with diabetic polyneuropathy: Secondary | ICD-10-CM

## 2020-04-24 DIAGNOSIS — L089 Local infection of the skin and subcutaneous tissue, unspecified: Secondary | ICD-10-CM

## 2020-04-24 MED ORDER — DOXYCYCLINE HYCLATE 100 MG PO TABS: 100.0000 mg | ORAL_TABLET | Freq: Two times a day (BID) | ORAL | 0 refills | Status: DC

## 2020-04-24 MED ORDER — ONDANSETRON HCL 4 MG PO TABS: 4.0000 mg | ORAL_TABLET | Freq: Three times a day (TID) | ORAL | 0 refills | Status: DC | PRN

## 2020-04-26 ENCOUNTER — Emergency Department (HOSPITAL_COMMUNITY): Payer: Medicare Other

## 2020-04-26 ENCOUNTER — Encounter: Payer: Self-pay | Admitting: Podiatry

## 2020-04-26 ENCOUNTER — Encounter (HOSPITAL_COMMUNITY): Payer: Self-pay

## 2020-04-26 ENCOUNTER — Inpatient Hospital Stay (HOSPITAL_COMMUNITY)
Admission: EM | Admit: 2020-04-26 | Discharge: 2020-04-28 | DRG: 638 | Disposition: A | Discharge status: Home / Self Care | Payer: Medicare Other | Attending: Internal Medicine | Admitting: Internal Medicine

## 2020-04-26 ENCOUNTER — Other Ambulatory Visit: Payer: Self-pay

## 2020-04-26 DIAGNOSIS — L089 Local infection of the skin and subcutaneous tissue, unspecified: Secondary | ICD-10-CM | POA: Diagnosis present

## 2020-04-26 DIAGNOSIS — Z881 Allergy status to other antibiotic agents status: Secondary | ICD-10-CM | POA: Diagnosis not present

## 2020-04-26 DIAGNOSIS — Z888 Allergy status to other drugs, medicaments and biological substances status: Secondary | ICD-10-CM | POA: Diagnosis not present

## 2020-04-26 DIAGNOSIS — E119 Type 2 diabetes mellitus without complications: Secondary | ICD-10-CM | POA: Diagnosis not present

## 2020-04-26 DIAGNOSIS — Z882 Allergy status to sulfonamides status: Secondary | ICD-10-CM | POA: Diagnosis not present

## 2020-04-26 DIAGNOSIS — R059 Cough, unspecified: Secondary | ICD-10-CM | POA: Diagnosis not present

## 2020-04-26 DIAGNOSIS — E11628 Type 2 diabetes mellitus with other skin complications: Secondary | ICD-10-CM | POA: Diagnosis not present

## 2020-04-26 DIAGNOSIS — L03116 Cellulitis of left lower limb: Secondary | ICD-10-CM | POA: Diagnosis not present

## 2020-04-26 DIAGNOSIS — J90 Pleural effusion, not elsewhere classified: Secondary | ICD-10-CM | POA: Diagnosis not present

## 2020-04-26 DIAGNOSIS — K219 Gastro-esophageal reflux disease without esophagitis: Secondary | ICD-10-CM | POA: Diagnosis not present

## 2020-04-26 DIAGNOSIS — I10 Essential (primary) hypertension: Secondary | ICD-10-CM | POA: Diagnosis present

## 2020-04-26 DIAGNOSIS — S91302A Unspecified open wound, left foot, initial encounter: Secondary | ICD-10-CM | POA: Diagnosis not present

## 2020-04-26 DIAGNOSIS — D509 Iron deficiency anemia, unspecified: Secondary | ICD-10-CM | POA: Diagnosis not present

## 2020-04-26 DIAGNOSIS — Z8674 Personal history of sudden cardiac arrest: Secondary | ICD-10-CM | POA: Diagnosis not present

## 2020-04-26 DIAGNOSIS — Z8249 Family history of ischemic heart disease and other diseases of the circulatory system: Secondary | ICD-10-CM | POA: Diagnosis not present

## 2020-04-26 DIAGNOSIS — S91332A Puncture wound without foreign body, left foot, initial encounter: Secondary | ICD-10-CM

## 2020-04-26 DIAGNOSIS — E785 Hyperlipidemia, unspecified: Secondary | ICD-10-CM | POA: Diagnosis not present

## 2020-04-26 DIAGNOSIS — D696 Thrombocytopenia, unspecified: Secondary | ICD-10-CM | POA: Diagnosis not present

## 2020-04-26 DIAGNOSIS — Z833 Family history of diabetes mellitus: Secondary | ICD-10-CM | POA: Diagnosis not present

## 2020-04-26 DIAGNOSIS — Z7984 Long term (current) use of oral hypoglycemic drugs: Secondary | ICD-10-CM

## 2020-04-26 DIAGNOSIS — E1142 Type 2 diabetes mellitus with diabetic polyneuropathy: Secondary | ICD-10-CM | POA: Diagnosis present

## 2020-04-26 DIAGNOSIS — G8929 Other chronic pain: Secondary | ICD-10-CM | POA: Diagnosis not present

## 2020-04-26 DIAGNOSIS — E876 Hypokalemia: Secondary | ICD-10-CM | POA: Diagnosis not present

## 2020-04-26 DIAGNOSIS — Z20822 Contact with and (suspected) exposure to covid-19: Secondary | ICD-10-CM | POA: Diagnosis not present

## 2020-04-26 DIAGNOSIS — M7732 Calcaneal spur, left foot: Secondary | ICD-10-CM | POA: Diagnosis not present

## 2020-04-26 DIAGNOSIS — Z66 Do not resuscitate: Secondary | ICD-10-CM | POA: Diagnosis not present

## 2020-04-26 DIAGNOSIS — R509 Fever, unspecified: Secondary | ICD-10-CM

## 2020-04-26 DIAGNOSIS — E7800 Pure hypercholesterolemia, unspecified: Secondary | ICD-10-CM | POA: Diagnosis present

## 2020-04-26 DIAGNOSIS — S91339A Puncture wound without foreign body, unspecified foot, initial encounter: Secondary | ICD-10-CM | POA: Diagnosis present

## 2020-04-26 DIAGNOSIS — M7989 Other specified soft tissue disorders: Secondary | ICD-10-CM | POA: Diagnosis not present

## 2020-04-26 DIAGNOSIS — F319 Bipolar disorder, unspecified: Secondary | ICD-10-CM | POA: Diagnosis present

## 2020-04-26 DIAGNOSIS — D539 Nutritional anemia, unspecified: Secondary | ICD-10-CM | POA: Diagnosis not present

## 2020-04-26 DIAGNOSIS — E1165 Type 2 diabetes mellitus with hyperglycemia: Secondary | ICD-10-CM | POA: Diagnosis not present

## 2020-04-26 DIAGNOSIS — W450XXA Nail entering through skin, initial encounter: Secondary | ICD-10-CM | POA: Diagnosis not present

## 2020-04-26 DIAGNOSIS — Z83438 Family history of other disorder of lipoprotein metabolism and other lipidemia: Secondary | ICD-10-CM | POA: Diagnosis not present

## 2020-04-26 DIAGNOSIS — Z79899 Other long term (current) drug therapy: Secondary | ICD-10-CM | POA: Diagnosis not present

## 2020-04-26 DIAGNOSIS — R0602 Shortness of breath: Secondary | ICD-10-CM | POA: Diagnosis not present

## 2020-04-26 LAB — COMPREHENSIVE METABOLIC PANEL
ALT: 26 U/L (ref 0–44)
AST: 28 U/L (ref 15–41)
Albumin: 3.3 g/dL — ABNORMAL LOW (ref 3.5–5.0)
Alkaline Phosphatase: 53 U/L (ref 38–126)
Anion gap: 12 (ref 5–15)
BUN: 19 mg/dL (ref 6–20)
CO2: 24 mmol/L (ref 22–32)
Calcium: 8.7 mg/dL — ABNORMAL LOW (ref 8.9–10.3)
Chloride: 99 mmol/L (ref 98–111)
Creatinine, Ser: 1.17 mg/dL (ref 0.61–1.24)
GFR, Estimated: >60 mL/min (ref >60)
Glucose, Bld: 267 mg/dL — ABNORMAL HIGH (ref 70–99)
Potassium: 3.1 mmol/L — ABNORMAL LOW (ref 3.5–5.1)
Sodium: 135 mmol/L (ref 135–145)
Total Bilirubin: 1.6 mg/dL — ABNORMAL HIGH (ref 0.3–1.2)
Total Protein: 6.8 g/dL (ref 6.5–8.1)

## 2020-04-26 LAB — CBC WITH DIFFERENTIAL/PLATELET
Abs Immature Granulocytes: 0.03 10*3/uL (ref 0.00–0.07)
Basophils Absolute: 0 10*3/uL (ref 0.0–0.1)
Basophils Relative: 1 %
Eosinophils Absolute: 0 10*3/uL (ref 0.0–0.5)
Eosinophils Relative: 0 %
HCT: 32.7 % — ABNORMAL LOW (ref 39.0–52.0)
Hemoglobin: 10.7 g/dL — ABNORMAL LOW (ref 13.0–17.0)
Immature Granulocytes: 1 %
Lymphocytes Relative: 6 %
Lymphs Abs: 0.3 10*3/uL — ABNORMAL LOW (ref 0.7–4.0)
MCH: 25.5 pg — ABNORMAL LOW (ref 26.0–34.0)
MCHC: 32.7 g/dL (ref 30.0–36.0)
MCV: 78 fL — ABNORMAL LOW (ref 80.0–100.0)
Monocytes Absolute: 0.3 10*3/uL (ref 0.1–1.0)
Monocytes Relative: 7 %
Neutro Abs: 4.1 10*3/uL (ref 1.7–7.7)
Neutrophils Relative %: 85 %
Platelets: 115 10*3/uL — ABNORMAL LOW (ref 150–400)
RBC: 4.19 MIL/uL — ABNORMAL LOW (ref 4.22–5.81)
RDW: 13.9 % (ref 11.5–15.5)
WBC: 4.8 10*3/uL (ref 4.0–10.5)
nRBC: 0 % (ref 0.0–0.2)

## 2020-04-26 LAB — RESP PANEL BY RT-PCR (FLU A&B, COVID) ARPGX2
Influenza A by PCR: NEGATIVE
Influenza B by PCR: NEGATIVE
SARS Coronavirus 2 by RT PCR: NEGATIVE

## 2020-04-26 LAB — GLUCOSE, CAPILLARY: Glucose-Capillary: 201 mg/dL — ABNORMAL HIGH (ref 70–99)

## 2020-04-26 LAB — PROTIME-INR
INR: 1.2 (ref 0.8–1.2)
Prothrombin Time: 14.9 seconds (ref 11.4–15.2)

## 2020-04-26 LAB — LACTIC ACID, PLASMA
Lactic Acid, Venous: 1.5 mmol/L (ref 0.5–1.9)
Lactic Acid, Venous: 2 mmol/L (ref 0.5–1.9)
Lactic Acid, Venous: 1.9 mmol/L (ref 0.5–1.9)

## 2020-04-26 LAB — HEMOGLOBIN A1C
Hgb A1c MFr Bld: 8.5 % — ABNORMAL HIGH (ref 4.8–5.6)
Mean Plasma Glucose: 197.25 mg/dL

## 2020-04-26 LAB — HIV ANTIBODY (ROUTINE TESTING W REFLEX): HIV Screen 4th Generation wRfx: NONREACTIVE

## 2020-04-26 LAB — APTT: aPTT: 22 seconds — ABNORMAL LOW (ref 24–36)

## 2020-04-26 MED ORDER — PROCHLORPERAZINE EDISYLATE 10 MG/2ML IJ SOLN
5.0000 mg | Freq: Once | INTRAMUSCULAR | Status: AC | PRN
  Administered 2020-04-26: 5 mg via INTRAVENOUS
  Filled 2020-04-26: qty 2

## 2020-04-26 MED ORDER — LOSARTAN POTASSIUM 50 MG PO TABS
100.0000 mg | ORAL_TABLET | Freq: Every day | ORAL | Status: DC
  Administered 2020-04-26 – 2020-04-28 (×3): 100 mg via ORAL
  Filled 2020-04-26 (×3): qty 2

## 2020-04-26 MED ORDER — QUETIAPINE FUMARATE 25 MG PO TABS
150.0000 mg | ORAL_TABLET | Freq: Every day | ORAL | Status: DC
  Administered 2020-04-26 – 2020-04-27 (×2): 150 mg via ORAL
  Filled 2020-04-26 (×2): qty 2

## 2020-04-26 MED ORDER — LACTATED RINGERS IV BOLUS (SEPSIS)
1000.0000 mL | Freq: Once | INTRAVENOUS | Status: AC
  Administered 2020-04-26: 1000 mL via INTRAVENOUS

## 2020-04-26 MED ORDER — AMITRIPTYLINE HCL 50 MG PO TABS
50.0000 mg | ORAL_TABLET | Freq: Every day | ORAL | Status: DC
  Administered 2020-04-26 – 2020-04-27 (×2): 50 mg via ORAL
  Filled 2020-04-26 (×2): qty 1

## 2020-04-26 MED ORDER — POTASSIUM CHLORIDE CRYS ER 20 MEQ PO TBCR
40.0000 meq | EXTENDED_RELEASE_TABLET | ORAL | Status: AC
  Administered 2020-04-26 – 2020-04-27 (×2): 40 meq via ORAL
  Filled 2020-04-26 (×2): qty 2

## 2020-04-26 MED ORDER — LACTATED RINGERS IV BOLUS: 500.0000 mL | Freq: Once | INTRAVENOUS | Status: DC

## 2020-04-26 MED ORDER — ACETAMINOPHEN 650 MG RE SUPP: 650.0000 mg | Freq: Four times a day (QID) | RECTAL | Status: DC | PRN

## 2020-04-26 MED ORDER — INSULIN ASPART 100 UNIT/ML ~~LOC~~ SOLN
0.0000 [IU] | Freq: Three times a day (TID) | SUBCUTANEOUS | Status: DC
  Administered 2020-04-27 – 2020-04-28 (×4): 3 [IU] via SUBCUTANEOUS

## 2020-04-26 MED ORDER — PIPERACILLIN-TAZOBACTAM 3.375 G IVPB
3.3750 g | Freq: Three times a day (TID) | INTRAVENOUS | Status: DC
  Administered 2020-04-26 – 2020-04-28 (×5): 3.375 g via INTRAVENOUS
  Filled 2020-04-26 (×5): qty 50

## 2020-04-26 MED ORDER — FENOFIBRATE 160 MG PO TABS
160.0000 mg | ORAL_TABLET | Freq: Every day | ORAL | Status: DC
  Administered 2020-04-26 – 2020-04-28 (×3): 160 mg via ORAL
  Filled 2020-04-26 (×3): qty 1

## 2020-04-26 MED ORDER — LACTATED RINGERS IV SOLN: INTRAVENOUS | Status: AC

## 2020-04-26 MED ORDER — PANTOPRAZOLE SODIUM 40 MG PO TBEC
40.0000 mg | DELAYED_RELEASE_TABLET | Freq: Every day | ORAL | Status: DC
  Administered 2020-04-26 – 2020-04-28 (×3): 40 mg via ORAL
  Filled 2020-04-26 (×3): qty 1

## 2020-04-26 MED ORDER — ACETAMINOPHEN 325 MG PO TABS
650.0000 mg | ORAL_TABLET | Freq: Four times a day (QID) | ORAL | Status: DC | PRN
  Administered 2020-04-26 – 2020-04-27 (×2): 650 mg via ORAL
  Filled 2020-04-26 (×2): qty 2

## 2020-04-26 MED ORDER — VANCOMYCIN HCL 1000 MG/200ML IV SOLN
1000.0000 mg | Freq: Two times a day (BID) | INTRAVENOUS | Status: DC
  Administered 2020-04-27 – 2020-04-28 (×3): 1000 mg via INTRAVENOUS
  Filled 2020-04-26 (×6): qty 200

## 2020-04-26 MED ORDER — LACTATED RINGERS IV BOLUS
500.0000 mL | Freq: Once | INTRAVENOUS | Status: AC
  Administered 2020-04-26: 500 mL via INTRAVENOUS

## 2020-04-26 MED ORDER — VANCOMYCIN HCL 2000 MG/400ML IV SOLN
2000.0000 mg | Freq: Once | INTRAVENOUS | Status: AC
  Administered 2020-04-26: 2000 mg via INTRAVENOUS
  Filled 2020-04-26: qty 400

## 2020-04-26 MED ORDER — PIPERACILLIN-TAZOBACTAM 3.375 G IVPB 30 MIN
3.3750 g | Freq: Once | INTRAVENOUS | Status: AC
  Administered 2020-04-26: 3.375 g via INTRAVENOUS
  Filled 2020-04-26: qty 50

## 2020-04-26 MED ORDER — SERTRALINE HCL 100 MG PO TABS
100.0000 mg | ORAL_TABLET | Freq: Every morning | ORAL | Status: DC
  Administered 2020-04-27 – 2020-04-28 (×2): 100 mg via ORAL
  Filled 2020-04-26 (×2): qty 1

## 2020-04-26 MED ORDER — BUPRENORPHINE HCL-NALOXONE HCL 8-2 MG SL SUBL
1.0000 | SUBLINGUAL_TABLET | Freq: Three times a day (TID) | SUBLINGUAL | Status: DC | PRN
  Administered 2020-04-26 – 2020-04-28 (×4): 1 via SUBLINGUAL
  Filled 2020-04-26 (×4): qty 1

## 2020-04-26 MED ORDER — POLYETHYLENE GLYCOL 3350 17 G PO PACK: 17.0000 g | PACK | Freq: Every day | ORAL | Status: DC | PRN

## 2020-04-26 MED ORDER — HYDROCHLOROTHIAZIDE 25 MG PO TABS
25.0000 mg | ORAL_TABLET | Freq: Every day | ORAL | Status: DC
  Administered 2020-04-27: 25 mg via ORAL
  Filled 2020-04-26 (×2): qty 1

## 2020-04-26 MED ORDER — PRAZOSIN HCL 2 MG PO CAPS
2.0000 mg | ORAL_CAPSULE | Freq: Every day | ORAL | Status: DC
  Administered 2020-04-26 – 2020-04-27 (×2): 2 mg via ORAL
  Filled 2020-04-26 (×3): qty 1

## 2020-04-26 MED ORDER — ATORVASTATIN CALCIUM 40 MG PO TABS
40.0000 mg | ORAL_TABLET | Freq: Every evening | ORAL | Status: DC
  Administered 2020-04-26 – 2020-04-27 (×2): 40 mg via ORAL
  Filled 2020-04-26 (×2): qty 1

## 2020-04-26 NOTE — Progress Notes (Signed)
Subjective:  Patient ID: [name] [name], male    DOB: Jan 03, 1961,  MRN: [id number]  Chief Complaint  Patient presents with  . Foot Injury    "I stepped on a nail the night before last.  I went to a Urgent Care clinic and they took xrays.  They said they didn't see a nail but they saw some debris."    60 y.o. male presents for wound care.  Patient presents with complaint of left heel ulceration that is progressively gotten worse.  Patient states he stepped on a nail few nights ago and went to the urgent care clinic they took an x-ray did not see a nail but saw some debris.  Patient states that he is a diabetic with last A1c of 7.1.  Patient states it has progressed to gotten worse there is some redness associate with him.  No purulent drainage was noted.  He has not done anything for it he has not done any kind dressings to it.  He does not have any systemic signs of infection.   Review of Systems: Negative except as noted in the HPI. Denies N/V/F/Ch.  Past Medical History:  Diagnosis Date  . Anxiety   . Arthritis   . Bipolar 1 disorder ([place])   . Blood transfusion without reported diagnosis   . Cataract   . Chronic pain   . Depressed bipolar disorder ([place])   . Depression   . Diabetes mellitus without complication ([place])   . Diabetic neuropathy ([place])   . GERD (gastroesophageal reflux disease)   . Herpes zoster 12/05/2008   Qualifier: Diagnosis of  By: Ronnald Ramp MD, Arvid Right.   . High cholesterol   . 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  Coupled with high doses of loperamide used for pain control.  Unintentional overdose -intractable VT leading to cardiogenic shock - ECMO  . Hyperlipidemia   . Hypertension   . Neuromuscular disorder ([place])    nerve damage back, neck, and shoulder  . Neuropathy in diabetes ([place])   . Sleep apnea    has CPAP but cannot use it  . Substance abuse ([place])    in past     Current  Outpatient Medications:  .  doxycycline (VIBRA-TABS) 100 MG tablet, Take 1 tablet (100 mg total) by mouth 2 (two) times daily., Disp: 20 tablet, Rfl: 0 .  ondansetron (ZOFRAN) 4 MG tablet, Take 1 tablet (4 mg total) by mouth every 8 (eight) hours as needed for nausea or vomiting., Disp: 20 tablet, Rfl: 0 .  amitriptyline (ELAVIL) 50 MG tablet, Take 50 mg by mouth at bedtime., Disp: , Rfl:  .  atorvastatin (LIPITOR) 40 MG tablet, TAKE 1 TABLET BY MOUTH DAILY AT 6 PM., Disp: 30 tablet, Rfl: 5 .  atorvastatin (LIPITOR) 40 MG tablet, TAKE 1 TABLET BY MOUTH DAILY AT 6 PM., Disp: 30 tablet, Rfl: 5 .  buprenorphine (SUBUTEX) 8 MG SUBL SL tablet, Place under the tongue daily. 3 tabs daily, Disp: , Rfl:  .  Dulaglutide (TRULICITY) 6.22 WL/7.9GX SOPN, Inject 0.75 mg into the skin once a week., Disp: 2 mL, Rfl: 2 .  fenofibrate (TRICOR) 145 MG tablet, Take 1 tablet (145 mg total) by mouth daily., Disp: 30 tablet, Rfl: 5 .  fenofibrate (TRICOR) 145 MG tablet, TAKE 1 TABLET (145 MG TOTAL) BY MOUTH DAILY., Disp: 30 tablet, Rfl: 5 .  hydrochlorothiazide (HYDRODIURIL) 25 MG tablet, TAKE 1 TABLET (25 MG TOTAL)  BY MOUTH DAILY., Disp: 30 tablet, Rfl: 5 .  Lancets (FREESTYLE) lancets, Use as instructed, Disp: , Rfl:  .  levofloxacin (LEVAQUIN) 500 MG tablet, TAKE 1 TABLET (500 MG TOTAL) BY MOUTH DAILY. (Patient not taking: Reported on 04/24/2020), Disp: 7 tablet, Rfl: 0 .  losartan (COZAAR) 100 MG tablet, Take 1 tablet (100 mg total) by mouth daily., Disp: 30 tablet, Rfl: 5 .  losartan (COZAAR) 100 MG tablet, TAKE 1 TABLET (100 MG TOTAL) BY MOUTH DAILY., Disp: 30 tablet, Rfl: 5 .  metFORMIN (GLUCOPHAGE-XR) 500 MG 24 hr tablet, TAKE 2 TABLETS (1,000 MG TOTAL) BY MOUTH 2 (TWO) TIMES DAILY., Disp: 120 tablet, Rfl: 5 .  omeprazole (PRILOSEC) 40 MG capsule, Take 40 mg by mouth daily., Disp: , Rfl:  .  ondansetron (ZOFRAN) 4 MG tablet, TAKE 1 TABLET (4 MG TOTAL) BY MOUTH EVERY 8 (EIGHT) HOURS AS NEEDED FOR NAUSEA OR  VOMITING., Disp: 20 tablet, Rfl: 0 .  ondansetron (ZOFRAN) 8 MG tablet, TAKE 1 TABLET (8 MG TOTAL) BY MOUTH EVERY 8 (EIGHT) HOURS AS NEEDED FOR NAUSEA OR VOMITING., Disp: 20 tablet, Rfl: 0 .  ondansetron (ZOFRAN-ODT) 4 MG disintegrating tablet, TAKE 1 TABLET (4 MG TOTAL) BY MOUTH EVERY 8 (EIGHT) HOURS AS NEEDED FOR NAUSEA OR VOMITING., Disp: 8 tablet, Rfl: 0 .  prazosin (MINIPRESS) 2 MG capsule, Take 2 mg by mouth at bedtime., Disp: , Rfl:  .  pregabalin (LYRICA) 300 MG capsule, Take 300 mg by mouth daily. (Patient not taking: Reported on 04/24/2020), Disp: , Rfl:  .  QUEtiapine (SEROQUEL) 300 MG tablet, Take 150 mg by mouth at bedtime., Disp: , Rfl:  .  Syringe/Needle, Disp, 30G X 1/2" 1 ML MISC, UAD - use as directed, Disp: 100 each, Rfl: 5  Social History   Tobacco Use  Smoking Status Never Smoker  Smokeless Tobacco Never Used    Allergies  Allergen Reactions  . Heparin Other (See Comments)    HIT Ab negative on 02/20/15, but SRA POSITIVE   . Oxytetracycline Rash and Other (See Comments)  . Sulfa Antibiotics Other (See Comments)  . Del-Mycin [Erythromycin]     All mycin drugs  . Other     Mycins  . Sulfa Antibiotics   . Heparin Rash  . Sulfonamide Derivatives Rash   Objective:   Vitals:   04/24/20 1531  BP: (!) 184/82  Pulse: 73  Resp: 16  Temp: 99.1 F (37.3 C)   There is no height or weight on file to calculate BMI. Constitutional Well developed. Well nourished.  Vascular Dorsalis pedis pulses faintly palpable bilaterally. Posterior tibial pulses faintly palpable bilaterally. Capillary refill normal to all digits.  No cyanosis or clubbing noted. Pedal hair growth normal.  Neurologic Normal speech. Oriented to person, place, and time. Protective sensation absent  Dermatologic Wound Location: Left heel ulceration probing down to deep tissue Wound Base: Mixed Granular/Fibrotic Peri-wound: Reddened Exudate: Scant/small amount Serosanguinous exudate Wound  Measurements: -See below  Orthopedic: No pain to palpation either foot.   Radiographs: 3 views of skeletally mature the left foot: Some debris noted however no nail noted.  Soft tissue defect noted to the heel.  No signs of osteomyelitis noted.  No cortical destruction noted. Assessment:   1. Diabetic polyneuropathy associated with type 2 diabetes mellitus ([place])   2. Heel ulcer, left, with necrosis of muscle ([place])   3. Soft tissue infection of foot    Plan:  Patient was evaluated and treated and all questions answered.  Ulcer left heel ulceration probing down to deep tissues/capsule with soft tissue infection -Debridement as below. -Dressed with Betadine wet-to-dry, DSD. -Continue off-loading with cam boot -Doxycycline was dispensed for skin and soft tissue prophylaxis I plan on keeping him on doxycycline until resolvent of the wound. -Given the location of the wound patient is a high risk of major amputation if the wound further regresses.  Given his A1c of 7.1 and the difficulty of healing the wound I discussed with the patient that he is at high risk of amputation.  He states understanding. -  Procedure: Excisional Debridement of Wound Tool: Sharp chisel blade/tissue nipper Rationale: Removal of non-viable soft tissue from the wound to promote healing.  Anesthesia: none Pre-Debridement Wound Measurements: 3 cm x 2.5 cm x 0.7 cm  Post-Debridement Wound Measurements: 3.2 cm x 2.7 cm x 0.7 cm  Type of Debridement: Sharp Excisional Tissue Removed: Non-viable soft tissue Blood loss: Minimal (<50cc) Depth of Debridement: Deep tissue/capsule Technique: Sharp excisional debridement to bleeding, viable wound base.  Wound Progress: Is my initial evaluation of continue to monitor the progression of it. Site healing conversation 7 Dressing: Dry, sterile, compression dressing. Disposition: Patient tolerated procedure well. Patient to return in 1 week for follow-up.  No follow-ups on  file.

## 2020-04-26 NOTE — H&P (Signed)
History and Physical:    DANILE [name]   MPN:[id number] DOB: 04/25/1960 DOA: 04/26/2020  Referring MD/provider: PA Joy PCP: Nicolette Bang, DO   Patient coming from: Home  Chief Complaint: Cellulitis  History of Present Illness:   [name] [name] is an 60 y.o. male with PMH significant for HTN, DM 2, bipolar disorder, HL states that he stepped on a nail 5 days ago walking around his yard with shoes on.  Went to urgent care where they apparently cleaned it out but told him that "the antibiotic you need we can give to you" and told him to follow-up with his podiatrist.  Patient saw his podiatrist 4 days ago and he was started on doxycycline.  This morning however patient woke up and had fevers chills and sweats.  Patient states his fever was up to 1002 at home.  He also noted that his foot was much redder than it had been the day before.  Of note patient has had 2 previous diabetic foot infections in the past, one on his right foot about 6 years ago and another one on his left foot when he stepped on a screw.  Both of those were treated as an outpatient antibiotics and wound care by podiatry and have healed up.  Patient does not smoke.  Denies history of CAD or CHF.  No cerebrovascular disease.  ED Course:  The patient was noted to be febrile to 103 on presentation to the ED.  His vital signs were otherwise normal.  He was started on vancomycin and ceftriaxone and was discussed with podiatry, Dr. Sherryle Lis who is planning to come in and see the patient.  ROS:   ROS   Review of Systems: Eyes: Denies recent change in vision, no discharge, redness, pain noted Endocrine: Denies heat/cold intolerance, polyuria or weight loss. Respiratory: Denies cough, SOB at rest or hemoptysis Cardiovascular: Denies chest pain or palpitations GI: Denies nausea, vomiting, diarrhea or constipation GU: Denies dysuria, frequency or hematuria   Past Medical History:   Past Medical  History:  Diagnosis Date  . Anxiety   . Arthritis   . Bipolar 1 disorder ([place])   . Blood transfusion without reported diagnosis   . Cataract   . Chronic pain   . Depressed bipolar disorder ([place])   . Depression   . Diabetes mellitus without complication ([place])   . Diabetic neuropathy ([place])   . GERD (gastroesophageal reflux disease)   . Herpes zoster 12/05/2008   Qualifier: Diagnosis of  By: Ronnald Ramp MD, Arvid Right.   . High cholesterol   . 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  Coupled with high doses of loperamide used for pain control.  Unintentional overdose -intractable VT leading to cardiogenic shock - ECMO  . Hyperlipidemia   . Hypertension   . Neuromuscular disorder ([place])    nerve damage back, neck, and shoulder  . Neuropathy in diabetes ([place])   . Sleep apnea    has CPAP but cannot use it  . Substance abuse ([place])    in past     Past Surgical History:   Past Surgical History:  Procedure Laterality Date  . COLONOSCOPY    . 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 INTRAOPERATIVE TRANSESOPHAGEAL ECHOCARDIOGRAM N/A 11/26/2016   Procedure: INTRAOPERATIVE TRANSESOPHAGEAL ECHOCARDIOGRAM;  Surgeon: Ivin Poot, MD;  Location: [facility];  Service: Open Heart Surgery;  Laterality: N/A;  . POLYPECTOMY    . SHOULDER ARTHROSCOPY WITH ROTATOR CUFF REPAIR Right 2009  . [place]  2010  . TRANSTHORACIC ECHOCARDIOGRAM  07/2016; 12/10/2016   a. Prior to VT arrest: normal. EF 55-60%. Gr 1 DD.  mild LVH. Mildly dilated Aortic Root.;; b.  Normal LV size and function.  Mild LVH.  EF 55%.  No RWMA.  No valve abnormalities.    Social History:   Social History   Socioeconomic History  . Marital status: Divorced    Spouse name: Not on file  . Number of children: 3  . Years of education: Not on file  . Highest education  level: Not on file  Occupational History  . Occupation: Disabled  Tobacco Use  . Smoking status: Never Smoker  . Smokeless tobacco: Never Used  Substance and Sexual Activity  . Alcohol use: Not Currently  . Drug use: No  . Sexual activity: Not Currently  Other Topics Concern  . Not on file  Social History Narrative   ** Merged History Encounter **       Social Determinants of Health   Financial Resource Strain: Not on file  Food Insecurity: Not on file  Transportation Needs: Not on file  Physical Activity: Not on file  Stress: Not on file  Social Connections: Not on file  Intimate Partner Violence: Not on file    Allergies   Heparin, Oxytetracycline, Sulfa antibiotics, Del-mycin [erythromycin], Other, Sulfa antibiotics, Heparin, and Sulfonamide derivatives  Family history:   Family History  Problem Relation Age of Onset  . Diabetes Mother   . Hyperlipidemia Mother   . Heart disease Father   . Colon cancer Neg Hx   . Colon polyps Neg Hx   . Esophageal cancer Neg Hx   . Rectal cancer Neg Hx   . Stomach cancer Neg Hx     Current Medications:   Prior to Admission medications   Medication Sig Start Date End Date Taking? Authorizing Provider  amitriptyline (ELAVIL) 50 MG tablet Take 50 mg by mouth at bedtime.    [provider]  atorvastatin (LIPITOR) 40 MG tablet TAKE 1 TABLET BY MOUTH DAILY AT 6 PM. 11/09/19   McClung, Dionne Bucy, PA-C  atorvastatin (LIPITOR) 40 MG tablet TAKE 1 TABLET BY MOUTH DAILY AT 6 PM. 07/25/19 07/24/20  Nicolette Bang, DO  buprenorphine (SUBUTEX) 8 MG SUBL SL tablet Place under the tongue daily. 3 tabs daily    [provider]  doxycycline (VIBRA-TABS) 100 MG tablet Take 1 tablet (100 mg total) by mouth 2 (two) times daily. 04/24/20   Felipa Furnace, DPM  Dulaglutide (TRULICITY) 9.62 EZ/6.6QH SOPN Inject 0.75 mg into the skin once a week. 11/09/19   Argentina Donovan, PA-C  fenofibrate (TRICOR) 145 MG tablet Take 1  tablet (145 mg total) by mouth daily. 11/09/19   Argentina Donovan, PA-C  fenofibrate (TRICOR) 145 MG tablet TAKE 1 TABLET (145 MG TOTAL) BY MOUTH DAILY. 07/25/19 07/24/20  Nicolette Bang, DO  hydrochlorothiazide (HYDRODIURIL) 25 MG tablet TAKE 1 TABLET (25 MG TOTAL) BY MOUTH DAILY. 11/09/19 11/08/20  Argentina Donovan, PA-C  Lancets (FREESTYLE) lancets Use as instructed 05/24/14   [provider]  levofloxacin (LEVAQUIN) 500 MG tablet TAKE 1 TABLET (500 MG TOTAL) BY MOUTH DAILY. Patient not taking: Reported on 04/24/2020 01/02/20 01/01/21  Tedd Sias, PA  losartan (COZAAR) 100 MG tablet Take 1 tablet (  100 mg total) by mouth daily. 11/09/19   Argentina Donovan, PA-C  losartan (COZAAR) 100 MG tablet TAKE 1 TABLET (100 MG TOTAL) BY MOUTH DAILY. 07/25/19 07/24/20  Nicolette Bang, DO  metFORMIN (GLUCOPHAGE-XR) 500 MG 24 hr tablet TAKE 2 TABLETS (1,000 MG TOTAL) BY MOUTH 2 (TWO) TIMES DAILY. 02/13/20 02/12/21  Nicolette Bang, DO  omeprazole (PRILOSEC) 40 MG capsule Take 40 mg by mouth daily.    [provider]  ondansetron (ZOFRAN) 4 MG tablet TAKE 1 TABLET (4 MG TOTAL) BY MOUTH EVERY 8 (EIGHT) HOURS AS NEEDED FOR NAUSEA OR VOMITING. 01/25/20 01/24/21  Felipa Furnace, DPM  ondansetron (ZOFRAN) 4 MG tablet Take 1 tablet (4 mg total) by mouth every 8 (eight) hours as needed for nausea or vomiting. 04/24/20   Felipa Furnace, DPM  ondansetron (ZOFRAN) 8 MG tablet TAKE 1 TABLET (8 MG TOTAL) BY MOUTH EVERY 8 (EIGHT) HOURS AS NEEDED FOR NAUSEA OR VOMITING. 01/11/20 01/10/21  Felipa Furnace, DPM  ondansetron (ZOFRAN-ODT) 4 MG disintegrating tablet TAKE 1 TABLET (4 MG TOTAL) BY MOUTH EVERY 8 (EIGHT) HOURS AS NEEDED FOR NAUSEA OR VOMITING. 01/02/20 01/01/21  Tedd Sias, PA  prazosin (MINIPRESS) 2 MG capsule Take 2 mg by mouth at bedtime.    [provider]  pregabalin (LYRICA) 300 MG capsule Take 300 mg by mouth daily. Patient not taking: Reported on  04/24/2020    [provider]  QUEtiapine (SEROQUEL) 300 MG tablet Take 150 mg by mouth at bedtime.    [provider]  Syringe/Needle, Disp, 30G X 1/2" 1 ML MISC UAD - use as directed 12/29/18   Ladell Pier, MD  sertraline (ZOLOFT) 100 MG tablet Take 100 mg by mouth daily.  11/09/19  [provider]    Physical Exam:   Vitals:   04/26/20 1600 04/26/20 1615 04/26/20 1630 04/26/20 1645  BP: 114/83 131/61 (!) 151/73 113/70  Pulse: (!) 55 (!) 51 (!) 56 (!) 55  Resp: 12  10 10   Temp:      TempSrc:      SpO2: 95% 94% 97% 98%     Physical Exam: Blood pressure 113/70, pulse (!) 55, temperature 99.4 F (37.4 C), temperature source Oral, resp. rate 10, SpO2 98 %. Gen: Well-appearing man in no acute distress looking older than stated age lying in stretcher. Eyes: sclera anicteric, conjuctiva mildly injected bilaterally CVS: S1-S2, regulary, no gallops Respiratory:  decreased air entry likely secondary to decreased inspiratory effort GI: NABS, soft, NT  LE: Patient has clear cellulitis on dorsal aspect of left foot with CDI.  Pictures of ulcers can be found on PA Joy's ED note from today. Neuro:  grossly nonfocal.  Psych: patient is logical and coherent, judgement and insight appear normal, mood and affect appropriate to situation.   Data Review:    Labs: Basic Metabolic Panel: Recent Labs  Lab 04/26/20 1351  NA 135  K 3.1*  CL 99  CO2 24  GLUCOSE 267*  BUN 19  CREATININE 1.17  CALCIUM 8.7*   Liver Function Tests: Recent Labs  Lab 04/26/20 1351  AST 28  ALT 26  ALKPHOS 53  BILITOT 1.6*  PROT 6.8  ALBUMIN 3.3*   No results for input(s): LIPASE, AMYLASE in the last 168 hours. No results for input(s): AMMONIA in the last 168 hours. CBC: Recent Labs  Lab 04/26/20 1351  WBC 4.8  NEUTROABS 4.1  HGB 10.7*  HCT 32.7*  MCV 78.0*  PLT 115*   Cardiac Enzymes: No results for input(s): CKTOTAL, CKMB, CKMBINDEX, TROPONINI in the last  168 hours.  BNP (last 3 results) No results for input(s): PROBNP in the last 8760 hours. CBG: No results for input(s): GLUCAP in the last 168 hours.  Urinalysis    Component Value Date/Time   COLORURINE YELLOW 03/20/2016 1523   APPEARANCEUR CLEAR 03/20/2016 1523   LABSPEC 1.036 (H) 03/20/2016 1523   PHURINE 8.0 03/20/2016 1523   GLUCOSEU 50 (A) 03/20/2016 1523   HGBUR NEGATIVE 03/20/2016 1523   BILIRUBINUR NEGATIVE 03/20/2016 1523   KETONESUR 5 (A) 03/20/2016 1523   PROTEINUR 100 (A) 03/20/2016 1523   UROBILINOGEN 0.2 05/18/2008 1401   NITRITE NEGATIVE 03/20/2016 1523   LEUKOCYTESUR NEGATIVE 03/20/2016 1523      Radiographic Studies: DG Foot Complete Left  Result Date: 04/26/2020 CLINICAL DATA:  Wound plantar region EXAM: LEFT FOOT - COMPLETE 3+ VIEW COMPARISON:  None. FINDINGS: Frontal, oblique, and lateral views were obtained. There is soft tissue swelling in the dorsal midfoot region. Suggestion of air in the volar soft tissues of the mid and hindfoot. No radiopaque foreign body. No acute fracture or dislocation. There is moderate narrowing of the first MTP joint. Other joint spaces appear unremarkable. There is mild spurring in the dorsal midfoot. No erosive change or bony destruction. There are posterior and inferior calcaneal spurs. IMPRESSION: Suggestion of soft tissue air volar to the hindfoot and midfoot without radiopaque foreign body. Soft tissue swelling dorsal midfoot. No acute fracture or dislocation. No erosive change or bony destruction. Spurring dorsal midfoot. Narrowing first MTP joint. Calcaneal spurs noted. Electronically Signed   By: Lowella Grip III M.D.   On: 04/26/2020 13:15    EKG: Independently reviewed.  Sinus rhythm at 60.  Normal intervals.  Normal axis.  Diffusely flattened T waves.  No acute ST-T wave changes.   Assessment/Plan:   Active Problems:   Diabetic foot infection ([place])  60 year old man with DM2 presents with worsening cellulitis  after having stepped on a nail which penetrated his shoe.  He has failed outpatient doxycycline.  Diabetic foot infection after stepping on nail Patient's wound has been cleaned twice, in urgent care and by podiatry He states he received a tetanus shot Agree with continuing vancomycin and Zosyn Plain films without evidence of bony erosions or OM and this is only 5 days after his initial injury Appreciate podiatry consultation  DM2 SSI AC at bedtime Hold dulaglutide and Metformin  HTN Continue HCTZ and prazosin  Chronic pain Confirmed with pharmacy, patient is indeed getting this, continue buprenorphine 8 mg sublingual every 8 hours as needed per home doses.  Bipolar disorder Continue amitriptyline, quetiapine, sertraline per home doses  Dyslipidemia Continue atorvastatin and fenofibrate  QT interval Patient is on multiple medications that can prolong QT however his QTc today is 463   Other information:   DVT prophylaxis: SCD ordered--patient is allergic to heparin Code Status: Full Family Communication: None Disposition Plan: Home Consults called: Podiatry Admission status: Inpatient  [facility]  If 7PM-7AM, please contact night-coverage [email or web address] Password [facility] 04/26/2020, 5:12 PM

## 2020-04-26 NOTE — Progress Notes (Signed)
Pt arrived to 5N. Vitals are stable. Pt alert and oriented and in no distress. Pt states he is very hungry and wants to eat. I messaged doctor about NPO status. He also was concerned about nightly medications he is supposed to take, but are not on his MAR, so pharmacy was called so they can get his medications right. Night shift will take over shortly.

## 2020-04-26 NOTE — ED Notes (Signed)
Attempted to call report, asked to call back in 5 minutes.

## 2020-04-26 NOTE — ED Triage Notes (Signed)
Pt BIB [facility] after stepping on nail Saturday. Current temp of 102 temporal, use of PO abx for one day. UC and podiatrist both did not give abx due to pt med list. Upon arrival temp is 103.3

## 2020-04-26 NOTE — Consult Note (Signed)
Reason for Consult:wound, cellulitis Referring Physician: Dr Jefm Bryant [name] [name] is an 60 y.o. male.  HPI: well known to my partner Dr Posey Pronto, he recently stepped on a nail in his left heel. He presented to the office and was placed on doxycycline. Was doing well until this AM when he woke up with chills, and a fever of 103. Feeling better now after antibiotics  Past Medical History:  Diagnosis Date  . Anxiety   . Arthritis   . Bipolar 1 disorder ([place])   . Blood transfusion without reported diagnosis   . Cataract   . Chronic pain   . Depressed bipolar disorder ([place])   . Depression   . Diabetes mellitus without complication ([place])   . Diabetic neuropathy ([place])   . GERD (gastroesophageal reflux disease)   . Herpes zoster 12/05/2008   Qualifier: Diagnosis of  By: Ronnald Ramp MD, Arvid Right.   . High cholesterol   . 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  Coupled with high doses of loperamide used for pain control.  Unintentional overdose -intractable VT leading to cardiogenic shock - ECMO  . Hyperlipidemia   . Hypertension   . Neuromuscular disorder ([place])    nerve damage back, neck, and shoulder  . Neuropathy in diabetes ([place])   . Sleep apnea    has CPAP but cannot use it  . Substance abuse ([place])    in past     Past Surgical History:  Procedure Laterality Date  . COLONOSCOPY    . 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 INTRAOPERATIVE TRANSESOPHAGEAL ECHOCARDIOGRAM N/A 11/26/2016   Procedure: INTRAOPERATIVE TRANSESOPHAGEAL ECHOCARDIOGRAM;  Surgeon: Ivin Poot, MD;  Location: [facility];  Service: Open Heart Surgery;  Laterality: N/A;  . POLYPECTOMY    . SHOULDER ARTHROSCOPY WITH ROTATOR CUFF REPAIR Right 2009  . [place]  2010  . TRANSTHORACIC ECHOCARDIOGRAM  07/2016; 12/10/2016   a. Prior to VT  arrest: normal. EF 55-60%. Gr 1 DD.  mild LVH. Mildly dilated Aortic Root.;; b.  Normal LV size and function.  Mild LVH.  EF 55%.  No RWMA.  No valve abnormalities.    Family History  Problem Relation Age of Onset  . Diabetes Mother   . Hyperlipidemia Mother   . Heart disease Father   . Colon cancer Neg Hx   . Colon polyps Neg Hx   . Esophageal cancer Neg Hx   . Rectal cancer Neg Hx   . Stomach cancer Neg Hx     Social History:  reports that he has never smoked. He has never used smokeless tobacco. He reports previous alcohol use. He reports that he does not use drugs.  Allergies:  Allergies  Allergen Reactions  . Heparin Other (See Comments)    HIT Ab negative on 02/20/15, but SRA POSITIVE   . Oxytetracycline Rash and Other (See Comments)  . Del-Mycin [Erythromycin]     All mycin drugs  . Other     Mycins  . Sulfa Antibiotics   . Sulfonamide Derivatives Rash    Medications: I have reviewed the patient's current medications.  Results for orders placed or performed during the hospital encounter of 04/26/20 (from the past 48 hour(s))  Resp Panel by RT-PCR (Flu A&B, Covid) Nasopharyngeal Swab     Status: None   Collection Time: 04/26/20 12:37 PM  Specimen: Nasopharyngeal Swab; Nasopharyngeal(NP) swabs in vial transport medium  Result Value Ref Range   SARS Coronavirus 2 by RT PCR NEGATIVE NEGATIVE    Comment: (NOTE) SARS-CoV-2 target nucleic acids are NOT DETECTED.  The SARS-CoV-2 RNA is generally detectable in upper respiratory specimens during the acute phase of infection. The lowest concentration of SARS-CoV-2 viral copies this assay can detect is 138 copies/mL. A negative result does not preclude SARS-Cov-2 infection and should not be used as the sole basis for treatment or other patient management decisions. A negative result may occur with  improper specimen collection/handling, submission of specimen other than nasopharyngeal swab, presence of viral mutation(s)  within the areas targeted by this assay, and inadequate number of viral copies(<138 copies/mL). A negative result must be combined with clinical observations, patient history, and epidemiological information. The expected result is Negative.  Fact Sheet for Patients:  EntrepreneurPulse.com.au  Fact Sheet for Healthcare Providers:  IncredibleEmployment.be  This test is no t yet approved or cleared by the [place] FDA and  has been authorized for detection and/or diagnosis of SARS-CoV-2 by FDA under an Emergency Use Authorization (EUA). This EUA will remain  in effect (meaning this test can be used) for the duration of the COVID-19 declaration under Section 564(b)(1) of the Act, 21 U.S.C.section 360bbb-3(b)(1), unless the authorization is terminated  or revoked sooner.       Influenza A by PCR NEGATIVE NEGATIVE   Influenza B by PCR NEGATIVE NEGATIVE    Comment: (NOTE) The Xpert Xpress SARS-CoV-2/FLU/RSV plus assay is intended as an aid in the diagnosis of influenza from Nasopharyngeal swab specimens and should not be used as a sole basis for treatment. Nasal washings and aspirates are unacceptable for Xpert Xpress SARS-CoV-2/FLU/RSV testing.  Fact Sheet for Patients: EntrepreneurPulse.com.au  Fact Sheet for Healthcare Providers: IncredibleEmployment.be  This test is not yet approved or cleared by the [place] FDA and has been authorized for detection and/or diagnosis of SARS-CoV-2 by FDA under an Emergency Use Authorization (EUA). This EUA will remain in effect (meaning this test can be used) for the duration of the COVID-19 declaration under Section 564(b)(1) of the Act, 21 U.S.C. section 360bbb-3(b)(1), unless the authorization is terminated or revoked.  Performed at [facility], [place] [address], [place], [place] [address]   Lactic acid, plasma     Status: None   Collection Time:  04/26/20  1:48 PM  Result Value Ref Range   Lactic Acid, Venous 1.5 0.5 - 1.9 mmol/L    Comment: Performed at [facility] [address], [place], [place] [address]  Comprehensive metabolic panel     Status: Abnormal   Collection Time: 04/26/20  1:51 PM  Result Value Ref Range   Sodium 135 135 - 145 mmol/L   Potassium 3.1 (L) 3.5 - 5.1 mmol/L   Chloride 99 98 - 111 mmol/L   CO2 24 22 - 32 mmol/L   Glucose, Bld 267 (H) 70 - 99 mg/dL    Comment: Glucose reference range applies only to samples taken after fasting for at least 8 hours.   BUN 19 6 - 20 mg/dL   Creatinine, Ser 1.17 0.61 - 1.24 mg/dL   Calcium 8.7 (L) 8.9 - 10.3 mg/dL   Total Protein 6.8 6.5 - 8.1 g/dL   Albumin 3.3 (L) 3.5 - 5.0 g/dL   AST 28 15 - 41 U/L   ALT 26 0 - 44 U/L   Alkaline Phosphatase 53 38 - 126  U/L   Total Bilirubin 1.6 (H) 0.3 - 1.2 mg/dL   GFR, Estimated >60 >60 mL/min    Comment: (NOTE) Calculated using the CKD-EPI Creatinine Equation (2021)    Anion gap 12 5 - 15    Comment: Performed at [facility] [address] [place], [place] [address]  CBC WITH DIFFERENTIAL     Status: Abnormal   Collection Time: 04/26/20  1:51 PM  Result Value Ref Range   WBC 4.8 4.0 - 10.5 K/uL   RBC 4.19 (L) 4.22 - 5.81 MIL/uL   Hemoglobin 10.7 (L) 13.0 - 17.0 g/dL   HCT 32.7 (L) 39.0 - 52.0 %   MCV 78.0 (L) 80.0 - 100.0 fL   MCH 25.5 (L) 26.0 - 34.0 pg   MCHC 32.7 30.0 - 36.0 g/dL   RDW 13.9 11.5 - 15.5 %   Platelets 115 (L) 150 - 400 K/uL    Comment: Immature Platelet Fraction may be clinically indicated, consider ordering this additional test [id number] REPEATED TO VERIFY PLATELET COUNT CONFIRMED BY SMEAR    nRBC 0.0 0.0 - 0.2 %   Neutrophils Relative % 85 %   Neutro Abs 4.1 1.7 - 7.7 K/uL   Lymphocytes Relative 6 %   Lymphs Abs 0.3 (L) 0.7 - 4.0 K/uL   Monocytes Relative 7 %   Monocytes Absolute 0.3 0.1 - 1.0 K/uL   Eosinophils Relative 0 %   Eosinophils Absolute 0.0 0.0 - 0.5 K/uL    Basophils Relative 1 %   Basophils Absolute 0.0 0.0 - 0.1 K/uL   Immature Granulocytes 1 %   Abs Immature Granulocytes 0.03 0.00 - 0.07 K/uL    Comment: Performed at [facility], [place] [address], [place], [place] [address]  Protime-INR     Status: None   Collection Time: 04/26/20  1:51 PM  Result Value Ref Range   Prothrombin Time 14.9 11.4 - 15.2 seconds   INR 1.2 0.8 - 1.2    Comment: (NOTE) INR goal varies based on device and disease states. Performed at [facility], [place] [address], [place], [place] [address]   APTT     Status: Abnormal   Collection Time: 04/26/20  1:51 PM  Result Value Ref Range   aPTT 22 (L) 24 - 36 seconds    Comment: Performed at [facility] [address] [place], [place] [address]    DG Foot Complete Left  Result Date: 04/26/2020 CLINICAL DATA:  Wound plantar region EXAM: LEFT FOOT - COMPLETE 3+ VIEW COMPARISON:  None. FINDINGS: Frontal, oblique, and lateral views were obtained. There is soft tissue swelling in the dorsal midfoot region. Suggestion of air in the volar soft tissues of the mid and hindfoot. No radiopaque foreign body. No acute fracture or dislocation. There is moderate narrowing of the first MTP joint. Other joint spaces appear unremarkable. There is mild spurring in the dorsal midfoot. No erosive change or bony destruction. There are posterior and inferior calcaneal spurs. IMPRESSION: Suggestion of soft tissue air volar to the hindfoot and midfoot without radiopaque foreign body. Soft tissue swelling dorsal midfoot. No acute fracture or dislocation. No erosive change or bony destruction. Spurring dorsal midfoot. Narrowing first MTP joint. Calcaneal spurs noted. Electronically Signed   By: Lowella Grip III M.D.   On: 04/26/2020 13:15    Review of Systems  Constitutional: Positive for chills, fever and malaise/fatigue.  All other systems reviewed and are negative.  Blood pressure (!) 139/53,  pulse (!) 59, temperature  99.3 F (37.4 C), temperature source Oral, resp. rate 11, SpO2 100 %.  Vitals:   04/26/20 1831 04/26/20 1842  BP:  (!) 139/53  Pulse:  (!) 59  Resp:    Temp: 98.7 F (37.1 C) 99.3 F (37.4 C)  SpO2:  100%    General AA&O x3. Normal mood and affect.  Vascular Capillary fill time brisk, foot is [facility]  Neurologic Epicritic sensation grossly absent.  Dermatologic (Wound) Plantar heel wound with fibrogranular wound base  Cellulitis from ankle to toes, painful  Orthopedic: Motor intact BLE.        Assessment/Plan:  Puncture wound -Imaging: Studies independently reviewed -No indication on labs or imaging of deep bony infection -Antibiotics: broad spectrum with pseudomonas coverage. Vanc and Zosyn running -WB Status: minimal WB -Wound Care: daily dressing changes with saline wet to dry -Surgical Plan: no surgical plans -hopefully improves on IV abx for 1-2 days and can discharge on doxycycline and levofloxacin  Criselda Peaches 04/26/2020, 7:46 PM   Best available via secure chat for questions or concerns.

## 2020-04-26 NOTE — ED Notes (Signed)
Tele tracking requested, pt ready for transport.

## 2020-04-26 NOTE — Progress Notes (Signed)
Pharmacy Antibiotic Note  [name] [name] is a 60 y.o. male admitted on 04/26/2020 with wound infection.  Pharmacy has been consulted for Zosyn and vancomycin dosing.  SCr wnl, WBC wnl.   Plan: -Zosyn 3.375 gm IV Q 8 hours (EI infusion) -Vancomycin 2 gm IV load followed by Vancomycin 1000 mg IV Q 12 hrs. Goal AUC 400-550. Expected AUC: 513 SCr used: 1.17 -Monitor CBC, renal fx, cultures and clinical progress -Vanc levels at SS       Temp (24hrs), Avg:103.3 F (39.6 C), Min:103.3 F (39.6 C), Max:103.3 F (39.6 C)  No results for input(s): WBC, CREATININE, LATICACIDVEN, VANCOTROUGH, VANCOPEAK, VANCORANDOM, GENTTROUGH, GENTPEAK, GENTRANDOM, TOBRATROUGH, TOBRAPEAK, TOBRARND, AMIKACINPEAK, AMIKACINTROU, AMIKACIN in the last 168 hours.  CrCl cannot be calculated (Patient's most recent lab result is older than the maximum 21 days allowed.).    Allergies  Allergen Reactions  . Heparin Other (See Comments)    HIT Ab negative on 02/20/15, but SRA POSITIVE   . Oxytetracycline Rash and Other (See Comments)  . Sulfa Antibiotics Other (See Comments)  . Del-Mycin [Erythromycin]     All mycin drugs  . Other     Mycins  . Sulfa Antibiotics   . Heparin Rash  . Sulfonamide Derivatives Rash    Antimicrobials this admission: Zosyn 4/14 >>  Vanc 4/14 >>   Dose adjustments this admission:   Microbiology results:  Thank you for allowing pharmacy to be a part of this patient's care.  Albertina Parr, PharmD., BCPS, BCCCP Clinical Pharmacist Please refer to [facility] for unit-specific pharmacist

## 2020-04-26 NOTE — ED Notes (Signed)
Patient transported to X-ray 

## 2020-04-26 NOTE — ED Provider Notes (Signed)
[name]Y DEPARTMENT Provider Note   CSN: [id number] Arrival date & time: 04/26/20  1129     History Chief Complaint  Patient presents with  . Fever    Stepped on nail Saturday, abx since yesterday    EMIT [name] [name] is a 60 y.o. male.  HPI      [name] [name] is a 60 y.o. male, with a history of DM, bipolar, hypercholesterolemia, presenting to the ED with wound to the left foot that occurred evening of April 10. States he was walking in the yard wearing crocs and stepped on a nail.  Due to his neuropathy he did not know he had stepped on the nail until he went inside and noted bleeding from the foot.  He remove the nail himself.  April 11: Patient was seen at urgent care, x-rays were performed, tiny radiopaque objects were noted within the wound, consistent with foreign bodies.  Patient states he was told antibiotics were not prescribed because the antibiotics they wanted to prescribe the patient would not be able to take.  April 12: Patient was seen by his podiatrist.  He was started on doxycycline.  Patient has since had 3 doses of doxycycline.  He was told to come to the ED if he began to feel worse.  This morning, patient woke up and states he "felt terrible and rundown."  He endorses sweating and body aches. He states due to his neuropathy he has very little feeling below the level of mid to proximal shin so therefore he did not feel pain, however, he did note redness and swelling into the left foot. Denies anticoagulation. Denies shortness of breath, chest pain, abdominal pain, syncope, dizziness, or any other complaints.   Past Medical History:  Diagnosis Date  . Anxiety   . Arthritis   . Bipolar 1 disorder ([place])   . Blood transfusion without reported diagnosis   . Cataract   . Chronic pain   . Depressed bipolar disorder ([place])   . Depression   . Diabetes mellitus without complication ([place])   . Diabetic neuropathy ([place])   .  GERD (gastroesophageal reflux disease)   . Herpes zoster 12/05/2008   Qualifier: Diagnosis of  By: Ronnald Ramp MD, Arvid Right.   . High cholesterol   . 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  Coupled with high doses of loperamide used for pain control.  Unintentional overdose -intractable VT leading to cardiogenic shock - ECMO  . Hyperlipidemia   . Hypertension   . Neuromuscular disorder ([place])    nerve damage back, neck, and shoulder  . Neuropathy in diabetes ([place])   . Sleep apnea    has CPAP but cannot use it  . Substance abuse ([place])    in past     Patient Active Problem List   Diagnosis Date Noted  . Skin lesion 12/07/2019  . Chronic pain disorder 09/13/2019  . DDD (degenerative disc disease), lumbar 09/13/2019  . Dyslipidemia 09/13/2019  . Obesity 09/13/2019  . Peripheral neuropathy 09/13/2019  . Sleep apnea   . Prolonged Q-T interval on ECG 12/03/2016  . Drug-induced torsades de pointes ([place]) 12/03/2016  . Thrombocytopenia ([place]) 11/29/2016  . Personal history of ECMO 11/26/2016  . Personal history of spine surgery 11/26/2016  . S/P rotator cuff repair 11/26/2016  . 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 Mild concentric left ventricular hypertrophy (LVH) 09/11/2016  .  DJD (degenerative joint disease) of cervical spine 02/26/2016  . Tinea pedis of both feet 06/03/2015  . Pre-ulcerative calluses 06/01/2015  . Type 2 diabetes mellitus with diabetic polyneuropathy ([place]) 06/09/2014  . Generalized anxiety disorder 06/19/2012  . Herpes zoster 12/05/2008  . Mixed hyperlipidemia 05/11/2008  . DEPRESSION 05/11/2008  . Essential hypertension 05/11/2008  . GERD 05/11/2008  . OSTEOARTHRITIS 05/11/2008  . LOW BACK PAIN 05/11/2008  . COLONIC POLYPS, HX OF 05/11/2008    Past Surgical History:  Procedure Laterality Date  . COLONOSCOPY    . EXTRACORPOREAL CIRCULATION  11/2015    FOR Cardiogenic shock related to intractable [place] VT storm (prolonged QT from drug toxixcity)   . INTRAOPERATIVE TRANSESOPHAGEAL ECHOCARDIOGRAM N/A 11/26/2016   Procedure: INTRAOPERATIVE TRANSESOPHAGEAL ECHOCARDIOGRAM;  Surgeon: Ivin Poot, MD;  Location: [facility];  Service: Open Heart Surgery;  Laterality: N/A;  . POLYPECTOMY    . SHOULDER ARTHROSCOPY WITH ROTATOR CUFF REPAIR Right 2009  . [place]  2010  . TRANSTHORACIC ECHOCARDIOGRAM  07/2016; 12/10/2016   a. Prior to VT arrest: normal. EF 55-60%. Gr 1 DD.  mild LVH. Mildly dilated Aortic Root.;; b.  Normal LV size and function.  Mild LVH.  EF 55%.  No RWMA.  No valve abnormalities.       Family History  Problem Relation Age of Onset  . Diabetes Mother   . Hyperlipidemia Mother   . Heart disease Father   . Colon cancer Neg Hx   . Colon polyps Neg Hx   . Esophageal cancer Neg Hx   . Rectal cancer Neg Hx   . Stomach cancer Neg Hx     Social History   Tobacco Use  . Smoking status: Never Smoker  . Smokeless tobacco: Never Used  Substance Use Topics  . Alcohol use: Not Currently  . Drug use: No    Home Medications Prior to Admission medications   Medication Sig Start Date End Date Taking? Authorizing Provider  amitriptyline (ELAVIL) 50 MG tablet Take 50 mg by mouth at bedtime.    [provider]  atorvastatin (LIPITOR) 40 MG tablet TAKE 1 TABLET BY MOUTH DAILY AT 6 PM. 11/09/19   McClung, Dionne Bucy, PA-C  atorvastatin (LIPITOR) 40 MG tablet TAKE 1 TABLET BY MOUTH DAILY AT 6 PM. 07/25/19 07/24/20  Nicolette Bang, DO  buprenorphine (SUBUTEX) 8 MG SUBL SL tablet Place under the tongue daily. 3 tabs daily    [provider]  doxycycline (VIBRA-TABS) 100 MG tablet Take 1 tablet (100 mg total) by mouth 2 (two) times daily. 04/24/20   Felipa Furnace, DPM  Dulaglutide (TRULICITY) 6.28 ZM/6.2HU SOPN Inject 0.75 mg into the skin once a week. 11/09/19   Argentina Donovan, PA-C  fenofibrate  (TRICOR) 145 MG tablet Take 1 tablet (145 mg total) by mouth daily. 11/09/19   Argentina Donovan, PA-C  fenofibrate (TRICOR) 145 MG tablet TAKE 1 TABLET (145 MG TOTAL) BY MOUTH DAILY. 07/25/19 07/24/20  Nicolette Bang, DO  hydrochlorothiazide (HYDRODIURIL) 25 MG tablet TAKE 1 TABLET (25 MG TOTAL) BY MOUTH DAILY. 11/09/19 11/08/20  Argentina Donovan, PA-C  Lancets (FREESTYLE) lancets Use as instructed 05/24/14   [provider]  levofloxacin (LEVAQUIN) 500 MG tablet TAKE 1 TABLET (500 MG TOTAL) BY MOUTH DAILY. Patient not taking: Reported on 04/24/2020 01/02/20 01/01/21  Tedd Sias, PA  losartan (COZAAR) 100 MG tablet Take 1 tablet (100 mg total) by mouth daily. 11/09/19   Argentina Donovan, PA-C  losartan (COZAAR) 100 MG tablet TAKE 1 TABLET (100 MG TOTAL) BY MOUTH DAILY. 07/25/19 07/24/20  Nicolette Bang, DO  metFORMIN (GLUCOPHAGE-XR) 500 MG 24 hr tablet TAKE 2 TABLETS (1,000 MG TOTAL) BY MOUTH 2 (TWO) TIMES DAILY. 02/13/20 02/12/21  Nicolette Bang, DO  omeprazole (PRILOSEC) 40 MG capsule Take 40 mg by mouth daily.    [provider]  ondansetron (ZOFRAN) 4 MG tablet TAKE 1 TABLET (4 MG TOTAL) BY MOUTH EVERY 8 (EIGHT) HOURS AS NEEDED FOR NAUSEA OR VOMITING. 01/25/20 01/24/21  Felipa Furnace, DPM  ondansetron (ZOFRAN) 4 MG tablet Take 1 tablet (4 mg total) by mouth every 8 (eight) hours as needed for nausea or vomiting. 04/24/20   Felipa Furnace, DPM  ondansetron (ZOFRAN) 8 MG tablet TAKE 1 TABLET (8 MG TOTAL) BY MOUTH EVERY 8 (EIGHT) HOURS AS NEEDED FOR NAUSEA OR VOMITING. 01/11/20 01/10/21  Felipa Furnace, DPM  ondansetron (ZOFRAN-ODT) 4 MG disintegrating tablet TAKE 1 TABLET (4 MG TOTAL) BY MOUTH EVERY 8 (EIGHT) HOURS AS NEEDED FOR NAUSEA OR VOMITING. 01/02/20 01/01/21  Tedd Sias, PA  prazosin (MINIPRESS) 2 MG capsule Take 2 mg by mouth at bedtime.    [provider]  pregabalin (LYRICA) 300 MG capsule Take 300 mg by mouth  daily. Patient not taking: Reported on 04/24/2020    [provider]  QUEtiapine (SEROQUEL) 300 MG tablet Take 150 mg by mouth at bedtime.    [provider]  Syringe/Needle, Disp, 30G X 1/2" 1 ML MISC UAD - use as directed 12/29/18   Ladell Pier, MD  sertraline (ZOLOFT) 100 MG tablet Take 100 mg by mouth daily.  11/09/19  [provider]    Allergies    Heparin, Oxytetracycline, Sulfa antibiotics, Del-mycin [erythromycin], Other, Sulfa antibiotics, Heparin, and Sulfonamide derivatives  Review of Systems   Review of Systems  Constitutional: Positive for diaphoresis and fever.  Respiratory: Negative for shortness of breath.   Cardiovascular: Negative for chest pain.  Gastrointestinal: Positive for nausea. Negative for abdominal pain, diarrhea and vomiting.  Musculoskeletal: Positive for myalgias.  Skin: Positive for wound.  Neurological: Positive for weakness.  All other systems reviewed and are negative.   Physical Exam Updated Vital Signs BP (!) 146/61 (BP Location: Left Arm)   Pulse 75   Temp (!) 103.3 F (39.6 C) (Oral)   Resp 16   SpO2 97%   Physical Exam Vitals and nursing note reviewed.  Constitutional:      General: He is not in acute distress.    Appearance: He is well-developed. He is not diaphoretic.  HENT:     Head: Normocephalic and atraumatic.     Mouth/Throat:     Mouth: Mucous membranes are moist.     Pharynx: Oropharynx is clear.  Eyes:     Conjunctiva/sclera: Conjunctivae normal.  Cardiovascular:     Rate and Rhythm: Normal rate and regular rhythm.     Pulses: Normal pulses.          Radial pulses are 2+ on the right side and 2+ on the left side.       Posterior tibial pulses are 2+ on the right side and 2+ on the left side.     Heart sounds: Normal heart sounds.     Comments: Tactile temperature in the extremities appropriate and equal bilaterally. Pulmonary:     Effort: Pulmonary effort is normal. No respiratory  distress.     Breath sounds: Normal breath sounds.  Abdominal:     Palpations: Abdomen is soft.     Tenderness: There is no abdominal tenderness. There is no guarding.  Musculoskeletal:     Cervical back: Neck supple.     Right lower leg: 1+ Pitting Edema present.     Left lower leg: 2+ Pitting Edema present.     Comments: Wound to the left plantar foot, as shown in the photos. No tenderness, however, patient states this would not be unusual due to his neuropathy. Foot is warm to the touch with erythema across the entire foot including the dorsal section.  Erythema extends above the ankle.  Of note, there is some erythema to the right lower leg, however, the erythema and swelling is worse on the left.  Skin:    General: Skin is warm and dry.     Capillary Refill: Capillary refill takes less than 2 seconds.  Neurological:     Mental Status: He is alert.     Comments: Patient has very little sensation, significantly decreased to light touch in the foot.  He is able to feel pressure sensation in the left lower leg to just below the knee.  Psychiatric:        Mood and Affect: Mood and affect normal.        Speech: Speech normal.        Behavior: Behavior normal.                   ED Results / Procedures / Treatments   Labs (all labs ordered are listed, but only abnormal results are displayed) Labs Reviewed  COMPREHENSIVE METABOLIC PANEL - Abnormal; Notable for the following components:      Result Value   Potassium 3.1 (*)    Glucose, Bld 267 (*)    Calcium 8.7 (*)    Albumin 3.3 (*)    Total Bilirubin 1.6 (*)    All other components within normal limits  CBC WITH DIFFERENTIAL/PLATELET - Abnormal; Notable for the following components:   RBC 4.19 (*)    Hemoglobin 10.7 (*)    HCT 32.7 (*)    MCV 78.0 (*)    MCH 25.5 (*)    Platelets 115 (*)    Lymphs Abs 0.3 (*)    All other components within normal limits  APTT - Abnormal; Notable for the following components:    aPTT 22 (*)    All other components within normal limits  RESP PANEL BY RT-PCR (FLU A&B, COVID) ARPGX2  CULTURE, BLOOD (ROUTINE X 2)  CULTURE, BLOOD (ROUTINE X 2)  LACTIC ACID, PLASMA  PROTIME-INR  LACTIC ACID, PLASMA    EKG None  Radiology DG Foot Complete Left  Result Date: 04/26/2020 CLINICAL DATA:  Wound plantar region EXAM: LEFT FOOT - COMPLETE 3+ VIEW COMPARISON:  None. FINDINGS: Frontal, oblique, and lateral views were obtained. There is soft tissue swelling in the dorsal midfoot region. Suggestion of air in the volar soft tissues of the mid and hindfoot. No radiopaque foreign body. No acute fracture or dislocation. There is moderate narrowing of the first MTP joint. Other joint spaces appear unremarkable. There is mild spurring in the dorsal midfoot. No erosive change or bony destruction. There are posterior and inferior calcaneal spurs. IMPRESSION: Suggestion of soft tissue air volar to the hindfoot and midfoot without radiopaque foreign body. Soft tissue swelling dorsal midfoot. No acute fracture or dislocation. No erosive change or bony destruction. Spurring dorsal midfoot. Narrowing first MTP joint. Calcaneal spurs noted. Electronically  Signed   By: Lowella Grip III M.D.   On: 04/26/2020 13:15    Procedures Procedures   Medications Ordered in ED Medications  lactated ringers infusion (has no administration in time range)  vancomycin (VANCOREADY) IVPB 2000 mg/400 mL (2,000 mg Intravenous New Bag/Given 04/26/20 1509)  piperacillin-tazobactam (ZOSYN) IVPB 3.375 g (has no administration in time range)  vancomycin (VANCOREADY) IVPB 1000 mg/200 mL (has no administration in time range)  lactated ringers bolus 1,000 mL (1,000 mLs Intravenous New Bag/Given 04/26/20 1403)  piperacillin-tazobactam (ZOSYN) IVPB 3.375 g (0 g Intravenous Stopped 04/26/20 1442)    ED Course  I have reviewed the triage vital signs and the nursing notes.  Pertinent labs & imaging results that were  available during my care of the patient were reviewed by me and considered in my medical decision making (see chart for details).  Clinical Course as of 04/26/20 1618  Thu Apr 26, 2020  1242 Spoke with Suezanne Jacquet, ED pharmacist.  Due to the patient's injury from a nail through shoe, his history of diabetes, and the fact that the patient has already been taking doxycycline, he recommends starting the patient on Zosyn and vancomycin. [SJ]  [place] with Silvestre Gunner. Advises to consult podiatry [SJ]  [phone] Spoke with Dr. Sherryle Lis, [place].  IV ABX, vancomycin and Zosyn. No plan for debridement at this time.  He will come see the patient tonight. [SJ]  1609 Spoke with Dr. Jamse Arn, hospitalist.  Agrees to admit the patient. [SJ]    Clinical Course User Index [SJ] Mykenzie Ebanks C, PA-C   MDM Rules/Calculators/A&P                          Patient presents with a wound to the left foot that occurred April 10.  Since that time there has been erosion to the wound as well as evidence of cellulitis spreading into the left lower leg.  Appearance of the patient's leg has continued to worsen despite oral antibiotics. Febrile at presentation to the ED but without tachycardia, hypotension, tachypnea.  Nontoxic-appearing. X-ray without evidence of osteomyelitis.  I suspect the area noted on the x-ray is due to the wound rather than gas from infection.  My suspicion for surgical emergency, such as necrotizing fasciitis is low. Due to worsening symptoms despite antibiotics, the patient's significant diabetic history, and the appearance of the wound I suspect patient would benefit from admission for IV antibiotics.  Patient to be followed by podiatry.   Findings and plan of care discussed with attending physician, Antony Blackbird, MD.   Vitals:   04/26/20 1430 04/26/20 1445 04/26/20 1500 04/26/20 1600  BP: 129/63 (!) 114/59 111/64 114/83  Pulse: (!) 58 (!) 55 (!) 58 (!) 55  Resp:   16 12  Temp:      TempSrc:       SpO2: 94% 93% 94% 95%     Final Clinical Impression(s) / ED Diagnoses Final diagnoses:  Penetrating wound of left foot, initial encounter    Rx / DC Orders ED Discharge Orders    None       Layla Maw 04/26/20 1619    Tegeler, Gwenyth Allegra, MD 04/26/20 1630

## 2020-04-26 NOTE — ED Notes (Signed)
Pt transporting upstairs at this time.

## 2020-04-26 NOTE — ED Notes (Addendum)
Called to give report. Secretary wants to confirm RN with charge and asked me to call back in 10 minutes.

## 2020-04-26 NOTE — Progress Notes (Incomplete)
Lactic acid 2.0. 

## 2020-04-27 ENCOUNTER — Inpatient Hospital Stay (HOSPITAL_COMMUNITY): Payer: Medicare Other

## 2020-04-27 DIAGNOSIS — E876 Hypokalemia: Secondary | ICD-10-CM

## 2020-04-27 DIAGNOSIS — D696 Thrombocytopenia, unspecified: Secondary | ICD-10-CM

## 2020-04-27 DIAGNOSIS — E11628 Type 2 diabetes mellitus with other skin complications: Principal | ICD-10-CM

## 2020-04-27 DIAGNOSIS — D539 Nutritional anemia, unspecified: Secondary | ICD-10-CM

## 2020-04-27 DIAGNOSIS — L089 Local infection of the skin and subcutaneous tissue, unspecified: Secondary | ICD-10-CM

## 2020-04-27 LAB — URINALYSIS, ROUTINE W REFLEX MICROSCOPIC
Bilirubin Urine: NEGATIVE
Glucose, UA: 50 mg/dL — AB
Hgb urine dipstick: NEGATIVE
Ketones, ur: NEGATIVE mg/dL
Leukocytes,Ua: NEGATIVE
Nitrite: NEGATIVE
Protein, ur: NEGATIVE mg/dL
Specific Gravity, Urine: 1.019 (ref 1.005–1.030)
pH: 5 (ref 5.0–8.0)

## 2020-04-27 LAB — CBC
HCT: 29.9 % — ABNORMAL LOW (ref 39.0–52.0)
Hemoglobin: 9.9 g/dL — ABNORMAL LOW (ref 13.0–17.0)
MCH: 25.6 pg — ABNORMAL LOW (ref 26.0–34.0)
MCHC: 33.1 g/dL (ref 30.0–36.0)
MCV: 77.5 fL — ABNORMAL LOW (ref 80.0–100.0)
Platelets: 128 10*3/uL — ABNORMAL LOW (ref 150–400)
RBC: 3.86 MIL/uL — ABNORMAL LOW (ref 4.22–5.81)
RDW: 13.8 % (ref 11.5–15.5)
WBC: 3.6 10*3/uL — ABNORMAL LOW (ref 4.0–10.5)
nRBC: 0 % (ref 0.0–0.2)

## 2020-04-27 LAB — BASIC METABOLIC PANEL
Anion gap: 4 — ABNORMAL LOW (ref 5–15)
Anion gap: 9 (ref 5–15)
BUN: 16 mg/dL (ref 6–20)
BUN: 14 mg/dL (ref 6–20)
CO2: 27 mmol/L (ref 22–32)
CO2: 23 mmol/L (ref 22–32)
Calcium: 8.1 mg/dL — ABNORMAL LOW (ref 8.9–10.3)
Calcium: 8.1 mg/dL — ABNORMAL LOW (ref 8.9–10.3)
Chloride: 103 mmol/L (ref 98–111)
Chloride: 102 mmol/L (ref 98–111)
Creatinine, Ser: 1.2 mg/dL (ref 0.61–1.24)
Creatinine, Ser: 1.06 mg/dL (ref 0.61–1.24)
GFR, Estimated: >60 mL/min (ref >60)
GFR, Estimated: >60 mL/min (ref >60)
Glucose, Bld: 212 mg/dL — ABNORMAL HIGH (ref 70–99)
Glucose, Bld: 186 mg/dL — ABNORMAL HIGH (ref 70–99)
Potassium: 3 mmol/L — ABNORMAL LOW (ref 3.5–5.1)
Potassium: 4 mmol/L (ref 3.5–5.1)
Sodium: 134 mmol/L — ABNORMAL LOW (ref 135–145)
Sodium: 134 mmol/L — ABNORMAL LOW (ref 135–145)

## 2020-04-27 LAB — GLUCOSE, CAPILLARY
Glucose-Capillary: 163 mg/dL — ABNORMAL HIGH (ref 70–99)
Glucose-Capillary: 177 mg/dL — ABNORMAL HIGH (ref 70–99)
Glucose-Capillary: 175 mg/dL — ABNORMAL HIGH (ref 70–99)
Glucose-Capillary: 189 mg/dL — ABNORMAL HIGH (ref 70–99)

## 2020-04-27 LAB — MAGNESIUM: Magnesium: 1.8 mg/dL (ref 1.7–2.4)

## 2020-04-27 MED ORDER — POTASSIUM CHLORIDE CRYS ER 20 MEQ PO TBCR
40.0000 meq | EXTENDED_RELEASE_TABLET | Freq: Once | ORAL | Status: AC
  Administered 2020-04-27: 40 meq via ORAL
  Filled 2020-04-27: qty 2

## 2020-04-27 MED ORDER — SENNOSIDES-DOCUSATE SODIUM 8.6-50 MG PO TABS
1.0000 | ORAL_TABLET | Freq: Two times a day (BID) | ORAL | Status: DC
  Administered 2020-04-27 – 2020-04-28 (×2): 1 via ORAL
  Filled 2020-04-27 (×2): qty 1

## 2020-04-27 NOTE — Progress Notes (Signed)
  Subjective:  Patient ID: [name] [name], male    DOB: 07/25/1960,  MRN: [id number]  States he is feeling much better today his foot does not hurt and the swelling is come down significantly Negative for chest pain and shortness of breath Fever: no Night sweats: no Constitutional signs: no Objective:   Vitals:   04/27/20 0420 04/27/20 0734  BP: (!) 104/48 (!) 145/52  Pulse: 63 66  Resp: 20 17  Temp: 99.9 F (37.7 C) 99.8 F (37.7 C)  SpO2: 91% 95%   General AA&O x3. Normal mood and affect.  Vascular Dorsalis pedis and posterior tibial pulses 2/4 bilat. Brisk capillary refill to all digits. Pedal hair present.  Neurologic Epicritic sensation grossly reduced.  Dermatologic  cellulitis nearly fully resolved, granular fibrotic wound bed  Orthopedic: MMT 5/5 in dorsiflexion, plantarflexion, inversion, and eversion. Normal joint ROM without pain or crepitus.        Assessment & Plan:  Patient was evaluated and treated and all questions answered.   -Today he is doing much better.  I think he should stay for final dose of antibiotics tonight he should be able to be discharged home tomorrow morning -Would recommend doxycycline and Levaquin at discharge.  He does have a history of previous prolonged QT interval, he will have to monitor closely and discussed the risks of this with him -We will sign off at this point he will follow-up with Dr. Posey Pronto as scheduled next week  Criselda Peaches, DPM  Accessible via secure chat for questions or concerns.

## 2020-04-27 NOTE — Plan of Care (Signed)
  Problem: Pain Managment: Goal: General experience of comfort will improve Outcome: Progressing   Problem: Safety: Goal: Ability to remain free from injury will improve Outcome: Progressing   

## 2020-04-27 NOTE — Progress Notes (Signed)
PROGRESS NOTE    LITTLE WINTON  ZRA:[id number] DOB: [birth year] DOA: 04/26/2020 PCP: Nicolette Bang, DO    Chief Complaint  Patient presents with  . Fever    Stepped on nail Saturday, abx since yesterday    Brief Narrative:  [name] [name] is an 60 y.o. male with PMH significant for HTN, DM 2, bipolar disorder, HL states that he stepped on a nail 5 days ago walking around his yard with shoes on.   Subjective:  Fever 101.3 at 3pm Denies pain  Assessment & Plan:   Active Problems:   Diabetic foot infection ([place])   Penetrating wound of left foot  Diabetic foot infection ( left )after stepping on a nail -Patient's wound has been cleaned twice, in urgent care and by podiatry, He states he received a tetanus shot -Plain films without evidence of bony erosions or OM and this is only 5 days after his initial injury -Fever 103.3 on presentation -Podiatry input appreciated, continue Vanco and Zosyn for now, transition to Doxy and Levaquin if clinically improves for IV antibiotic per podiatry recommendation, possible discharge tomorrow if fever free for 24hrs -Wound Care: daily dressing changes with saline wet to dry -follow-up with Dr. 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  No insulin-dependent type 2 diabetes, uncontrolled L4T 8.5 Trulicity once a week, metformin listed as home med Currently on sliding scale insulin A.m. blood glucose 212   Hypertension Blood pressure stable on losartan and Minipress HCTZ held due to electrolyte abnormality, he does report chronic lower  extremity edema, will need to resume HCTZ at discharge May need to discharge on potassium as well    History of PEA arrest due to QT prolongation from Imodium overdose , avoid QTC prolonging agent Keep potassium about 4, mag above 2 QTC 463 on admission  History of H ITT, avoid heparin  Chronic pain Confirmed with pharmacy, patient is indeed getting this, continue buprenorphine 8 mg sublingual every 8 hours as needed per home doses.  Bipolar disorder Continue amitriptyline, quetiapine, sertraline per home doses    Unresulted Labs (From admission, onward)          Start     Ordered   04/28/20 0500  Iron and TIBC  Tomorrow morning,   R        04/27/20 1754   04/28/20 0500  Ferritin  Tomorrow morning,   R        04/27/20 1754   04/28/20 0500  Vitamin B12  Tomorrow morning,   R        04/27/20 1754   04/28/20 0500  Folate  Tomorrow morning,   R        04/27/20 1754   04/28/20 0500  Reticulocytes  Tomorrow morning,   R        04/27/20 1754   04/28/20 0500  CBC with Differential/Platelet  Tomorrow morning,   R        04/27/20 1757   04/28/20 6256  Basic metabolic panel  Tomorrow morning,   R        04/27/20 1757   04/28/20 0500  Magnesium  Tomorrow morning,  R        04/27/20 1757   04/27/20 1518  Culture, blood (routine x 2)  BLOOD CULTURE X 2,   R      04/27/20 1517   04/27/20 1518  Urinalysis, Routine w reflex microscopic  Once,   R        04/27/20 1517   Unscheduled  Occult blood card to lab, stool  As needed,   R      04/27/20 1749            DVT prophylaxis: Place and maintain sequential compression device Start: 04/26/20 1940   Code Status: He confirmed that he is a DNR Family Communication: Patient Disposition:   Status is: Inpatient  Dispo: The patient is from: Home              Anticipated d/c is to: Home              Anticipated d/c date is: Possible tomorrow if fever free for 24 hours with continued clinical improvement                 Consultants:   Podiatry  Procedures:   None  Antimicrobials:   Anti-infectives (From admission, onward)   Start     Dose/Rate Route Frequency Ordered Stop   04/27/20 0400  vancomycin (VANCOREADY) IVPB 1000 mg/200 mL        1,000 mg 200 mL/hr over 60 Minutes Intravenous Every 12 hours 04/26/20 1516     04/26/20 2000  piperacillin-tazobactam (ZOSYN) IVPB 3.375 g        3.375 g 12.5 mL/hr over 240 Minutes Intravenous Every 8 hours 04/26/20 1516     04/26/20 1330  vancomycin (VANCOREADY) IVPB 2000 mg/400 mL        2,000 mg 200 mL/hr over 120 Minutes Intravenous  Once 04/26/20 1249 04/26/20 1832   04/26/20 1300  piperacillin-tazobactam (ZOSYN) IVPB 3.375 g        3.375 g 100 mL/hr over 30 Minutes Intravenous  Once 04/26/20 1249 04/26/20 1442          Objective: Vitals:   04/27/20 0100 04/27/20 0420 04/27/20 0734 04/27/20 1512  BP:  (!) 104/48 (!) 145/52 (!) 133/58  Pulse:  63 66 (!) 58  Resp:  20 17 16   Temp: 98.6 F (37 C) 99.9 F (37.7 C) 99.8 F (37.7 C)   TempSrc: Oral Oral Oral   SpO2:  91% 95% 98%    Intake/Output Summary (Last 24 hours) at 04/27/2020 1757 Last data filed at 04/27/2020 1144 Gross per 24 hour  Intake 50 ml  Output 1270 ml  Net -1220 ml   There were no vitals filed for this visit.  Examination:  General exam: calm, NAD Respiratory system: Clear to auscultation. Respiratory effort normal. Cardiovascular system: S1 & S2 heard, RRR. No JVD, no murmur, No pedal edema. Gastrointestinal system: Abdomen is nondistended, soft and nontender. No organomegaly or masses felt. Normal bowel sounds heard. Central nervous system: Alert and oriented. No focal neurological deficits. Extremities: Left foot dressing intact, mild edema erythema left lower extremity Skin: No rashes, lesions or ulcers Psychiatry: Judgement and insight appear normal. Mood & affect appropriate.     Data Reviewed: I have personally reviewed following labs and imaging  studies  CBC: Recent Labs  Lab 04/26/20 1351 04/27/20 0133  WBC 4.8 3.6*  NEUTROABS 4.1  --   HGB 10.7* 9.9*  HCT 32.7* 29.9*  MCV 78.0* 77.5*  PLT 115* 128*  Basic Metabolic Panel: Recent Labs  Lab 04/26/20 1351 04/27/20 0133 04/27/20 1134 04/27/20 1457  NA 135 134*  --  134*  K 3.1* 3.0*  --  4.0  CL 99 103  --  102  CO2 24 27  --  23  GLUCOSE 267* 212*  --  186*  BUN 19 16  --  14  CREATININE 1.17 1.20  --  1.06  CALCIUM 8.7* 8.1*  --  8.1*  MG  --   --  1.8  --     GFR: CrCl cannot be calculated (Unknown ideal weight.).  Liver Function Tests: Recent Labs  Lab 04/26/20 1351  AST 28  ALT 26  ALKPHOS 53  BILITOT 1.6*  PROT 6.8  ALBUMIN 3.3*    CBG: Recent Labs  Lab 04/26/20 2129 04/27/20 0634 04/27/20 1120 04/27/20 1707  GLUCAP 201* 163* 177* 175*     Recent Results (from the past 240 hour(s))  Resp Panel by RT-PCR (Flu A&B, Covid) Nasopharyngeal Swab     Status: None   Collection Time: 04/26/20 12:37 PM   Specimen: Nasopharyngeal Swab; Nasopharyngeal(NP) swabs in vial transport medium  Result Value Ref Range Status   SARS Coronavirus 2 by RT PCR NEGATIVE NEGATIVE Final    Comment: (NOTE) SARS-CoV-2 target nucleic acids are NOT DETECTED.  The SARS-CoV-2 RNA is generally detectable in upper respiratory specimens during the acute phase of infection. The lowest concentration of SARS-CoV-2 viral copies this assay can detect is 138 copies/mL. A negative result does not preclude SARS-Cov-2 infection and should not be used as the sole basis for treatment or other patient management decisions. A negative result may occur with  improper specimen collection/handling, submission of specimen other than nasopharyngeal swab, presence of viral mutation(s) within the areas targeted by this assay, and inadequate number of viral copies(<138 copies/mL). A negative result must be combined with clinical observations, patient history, and  epidemiological information. The expected result is Negative.  Fact Sheet for Patients:  EntrepreneurPulse.com.au  Fact Sheet for Healthcare Providers:  IncredibleEmployment.be  This test is no t yet approved or cleared by the [place] FDA and  has been authorized for detection and/or diagnosis of SARS-CoV-2 by FDA under an Emergency Use Authorization (EUA). This EUA will remain  in effect (meaning this test can be used) for the duration of the COVID-19 declaration under Section 564(b)(1) of the Act, 21 U.S.C.section 360bbb-3(b)(1), unless the authorization is terminated  or revoked sooner.       Influenza A by PCR NEGATIVE NEGATIVE Final   Influenza B by PCR NEGATIVE NEGATIVE Final    Comment: (NOTE) The Xpert Xpress SARS-CoV-2/FLU/RSV plus assay is intended as an aid in the diagnosis of influenza from Nasopharyngeal swab specimens and should not be used as a sole basis for treatment. Nasal washings and aspirates are unacceptable for Xpert Xpress SARS-CoV-2/FLU/RSV testing.  Fact Sheet for Patients: EntrepreneurPulse.com.au  Fact Sheet for Healthcare Providers: IncredibleEmployment.be  This test is not yet approved or cleared by the [place] FDA and has been authorized for detection and/or diagnosis of SARS-CoV-2 by FDA under an Emergency Use Authorization (EUA). This EUA will remain in effect (meaning this test can be used) for the duration of the COVID-19 declaration under Section 564(b)(1) of the Act, 21 U.S.C. section 360bbb-3(b)(1), unless the authorization is terminated or revoked.  Performed at [facility], [place] [address] [place], [place] [address]   Blood Culture (routine x 2)     Status: None (  Preliminary result)   Collection Time: 04/26/20  1:46 PM   Specimen: BLOOD RIGHT FOREARM  Result Value Ref Range Status   Specimen Description BLOOD RIGHT FOREARM  Final   Special  Requests   Final    BOTTLES DRAWN AEROBIC AND ANAEROBIC Blood Culture results may not be optimal due to an inadequate volume of blood received in culture bottles   Culture   Final    NO GROWTH < 24 HOURS Performed at [facility], [place]. [address], [place], [place] [address]    Report Status PENDING  Incomplete  Blood Culture (routine x 2)     Status: None (Preliminary result)   Collection Time: 04/26/20  1:51 PM   Specimen: BLOOD LEFT FOREARM  Result Value Ref Range Status   Specimen Description BLOOD LEFT FOREARM  Final   Special Requests   Final    BOTTLES DRAWN AEROBIC AND ANAEROBIC Blood Culture adequate volume   Culture   Final    NO GROWTH < 24 HOURS Performed at [facility], [place] [address], [place], [place] [address]    Report Status PENDING  Incomplete         Radiology Studies: DG CHEST PORT 1 VIEW  Result Date: 04/27/2020 CLINICAL DATA:  Fever and cough. EXAM: PORTABLE CHEST 1 VIEW COMPARISON:  11/26/2016 FINDINGS: The heart size is normal. The lungs are free of focal consolidations and pleural effusions. There is no pulmonary edema. IMPRESSION: No active disease. Electronically Signed   By: Nolon Nations M.D.   On: 04/27/2020 16:45   DG Foot Complete Left  Result Date: 04/26/2020 CLINICAL DATA:  Wound plantar region EXAM: LEFT FOOT - COMPLETE 3+ VIEW COMPARISON:  None. FINDINGS: Frontal, oblique, and lateral views were obtained. There is soft tissue swelling in the dorsal midfoot region. Suggestion of air in the volar soft tissues of the mid and hindfoot. No radiopaque foreign body. No acute fracture or dislocation. There is moderate narrowing of the first MTP joint. Other joint spaces appear unremarkable. There is mild spurring in the dorsal midfoot. No erosive change or bony destruction. There are posterior and inferior calcaneal spurs. IMPRESSION: Suggestion of soft tissue air volar to the hindfoot and midfoot without radiopaque foreign body. Soft tissue  swelling dorsal midfoot. No acute fracture or dislocation. No erosive change or bony destruction. Spurring dorsal midfoot. Narrowing first MTP joint. Calcaneal spurs noted. Electronically Signed   By: Lowella Grip III M.D.   On: 04/26/2020 13:15        Scheduled Meds: . amitriptyline  50 mg Oral QHS  . atorvastatin  40 mg Oral QPM  . fenofibrate  160 mg Oral Daily  . insulin aspart  0-15 Units Subcutaneous TID WC  . losartan  100 mg Oral Daily  . pantoprazole  40 mg Oral Daily  . prazosin  2 mg Oral QHS  . QUEtiapine  150 mg Oral QHS  . senna-docusate  1 tablet Oral BID  . sertraline  100 mg Oral q morning   Continuous Infusions: . piperacillin-tazobactam (ZOSYN)  IV 3.375 g (04/27/20 1150)  . vancomycin 1,000 mg (04/27/20 1522)     LOS: 1 day   Time spent: 20mins Greater than 50% of this time was spent in counseling, explanation of diagnosis, planning of further management, and coordination of care.   Voice Recognition Viviann Spare dictation system was used to create this note, attempts have been made to correct errors. Please contact the author with questions and/or clarifications.  Florencia Reasons, MD PhD FACP [facility]  Available via Epic secure chat 7am-7pm for nonurgent issues Please page for urgent issues To page the attending provider between 7A-7P or the covering provider during after hours 7P-7A, please log into the web site [email or web address] and access using universal [place] password for that web site. If you do not have the password, please call the hospital operator.    04/27/2020, 5:57 PM

## 2020-04-28 DIAGNOSIS — E119 Type 2 diabetes mellitus without complications: Secondary | ICD-10-CM

## 2020-04-28 LAB — CBC WITH DIFFERENTIAL/PLATELET
Abs Immature Granulocytes: 0.03 10*3/uL (ref 0.00–0.07)
Basophils Absolute: 0 10*3/uL (ref 0.0–0.1)
Basophils Relative: 1 %
Eosinophils Absolute: 0.1 10*3/uL (ref 0.0–0.5)
Eosinophils Relative: 3 %
HCT: 28.9 % — ABNORMAL LOW (ref 39.0–52.0)
Hemoglobin: 9.6 g/dL — ABNORMAL LOW (ref 13.0–17.0)
Immature Granulocytes: 1 %
Lymphocytes Relative: 17 %
Lymphs Abs: 0.6 10*3/uL — ABNORMAL LOW (ref 0.7–4.0)
MCH: 25.7 pg — ABNORMAL LOW (ref 26.0–34.0)
MCHC: 33.2 g/dL (ref 30.0–36.0)
MCV: 77.5 fL — ABNORMAL LOW (ref 80.0–100.0)
Monocytes Absolute: 0.3 10*3/uL (ref 0.1–1.0)
Monocytes Relative: 7 %
Neutro Abs: 2.6 10*3/uL (ref 1.7–7.7)
Neutrophils Relative %: 71 %
Platelets: 126 10*3/uL — ABNORMAL LOW (ref 150–400)
RBC: 3.73 MIL/uL — ABNORMAL LOW (ref 4.22–5.81)
RDW: 14 % (ref 11.5–15.5)
WBC: 3.5 10*3/uL — ABNORMAL LOW (ref 4.0–10.5)
nRBC: 0 % (ref 0.0–0.2)

## 2020-04-28 LAB — BASIC METABOLIC PANEL
Anion gap: 4 — ABNORMAL LOW (ref 5–15)
BUN: 11 mg/dL (ref 6–20)
CO2: 29 mmol/L (ref 22–32)
Calcium: 8.1 mg/dL — ABNORMAL LOW (ref 8.9–10.3)
Chloride: 103 mmol/L (ref 98–111)
Creatinine, Ser: 1.01 mg/dL (ref 0.61–1.24)
GFR, Estimated: >60 mL/min (ref >60)
Glucose, Bld: 185 mg/dL — ABNORMAL HIGH (ref 70–99)
Potassium: 3.2 mmol/L — ABNORMAL LOW (ref 3.5–5.1)
Sodium: 136 mmol/L (ref 135–145)

## 2020-04-28 LAB — RETICULOCYTES
Immature Retic Fract: 12.7 % (ref 2.3–15.9)
RBC.: 3.84 MIL/uL — ABNORMAL LOW (ref 4.22–5.81)
Retic Count, Absolute: 53.4 10*3/uL (ref 19.0–186.0)
Retic Ct Pct: 1.4 % (ref 0.4–3.1)

## 2020-04-28 LAB — IRON AND TIBC
Iron: 21 ug/dL — ABNORMAL LOW (ref 45–182)
Saturation Ratios: 8 % — ABNORMAL LOW (ref 17.9–39.5)
TIBC: 276 ug/dL (ref 250–450)
UIBC: 255 ug/dL

## 2020-04-28 LAB — MAGNESIUM: Magnesium: 1.9 mg/dL (ref 1.7–2.4)

## 2020-04-28 LAB — VITAMIN B12: Vitamin B-12: 187 pg/mL (ref 180–914)

## 2020-04-28 LAB — FERRITIN: Ferritin: 217 ng/mL (ref 24–336)

## 2020-04-28 LAB — FOLATE: Folate: 26.9 ng/mL (ref >5.9)

## 2020-04-28 LAB — GLUCOSE, CAPILLARY: Glucose-Capillary: 162 mg/dL — ABNORMAL HIGH (ref 70–99)

## 2020-04-28 MED ORDER — POTASSIUM CHLORIDE CRYS ER 20 MEQ PO TBCR
40.0000 meq | EXTENDED_RELEASE_TABLET | ORAL | Status: AC
  Administered 2020-04-28 (×2): 40 meq via ORAL
  Filled 2020-04-28 (×2): qty 2

## 2020-04-28 MED ORDER — LEVOFLOXACIN 500 MG PO TABS: ORAL_TABLET | Freq: Every day | ORAL | 0 refills | Status: AC

## 2020-04-28 MED ORDER — POTASSIUM CHLORIDE CRYS ER 20 MEQ PO TBCR: 40.0000 meq | EXTENDED_RELEASE_TABLET | Freq: Every day | ORAL | 0 refills | Status: DC

## 2020-04-28 MED ORDER — FERROUS SULFATE 325 (65 FE) MG PO TBEC: 325.0000 mg | DELAYED_RELEASE_TABLET | Freq: Every day | ORAL | 0 refills | Status: DC

## 2020-04-28 MED ORDER — VITAMIN B-12 1000 MCG PO TABS: 1000.0000 ug | ORAL_TABLET | Freq: Every day | ORAL | 0 refills | Status: DC

## 2020-04-28 NOTE — Plan of Care (Signed)

## 2020-04-28 NOTE — Plan of Care (Signed)

## 2020-04-28 NOTE — Discharge Summary (Addendum)
Discharge Summary  [name] [name] DGL:[id number] DOB: Jun 10, 1960  PCP: Nicolette Bang, DO  Admit date: 04/26/2020 Discharge date: 04/28/2020  Time spent:  34mins  Recommendations for Outpatient Follow-up:  1. F/u with PCP within a week  for hospital discharge follow up, repeat cbc/bmp at follow up. pcp to monitor potassium level, continue replaced if remain low. pcp to monitor anemia and blood glucose level, pcp to follow up on final blood culture result, currently on growth to date 2. F/u with podiatry in one week    Discharge Diagnoses:  Active Hospital Problems   Diagnosis Date Noted  . Diabetic foot infection ([place]) 04/26/2020  . Penetrating wound of left foot     Resolved Hospital Problems  No resolved problems to display.    Discharge Condition: stable  Diet recommendation: heart healthy/carb modified  There were no vitals filed for this visit.  History of present illness: ( per admitting MD Dr Jamse Arn) [name] [name] is an 60 y.o. male with PMH significant for HTN, DM 2, bipolar disorder, HL states that he stepped on a nail 5 days ago walking around his yard with shoes on.  Went to urgent care where they apparently cleaned it out but told him that "the antibiotic you need we can give to you" and told him to follow-up with his podiatrist.  Patient saw his podiatrist 4 days ago and he was started on doxycycline.  This morning however patient woke up and had fevers chills and sweats.  Patient states his fever was up to 1002 at home.  He also noted that his foot was much redder than it had been the day before.  Of note patient has had 2 previous diabetic foot infections in the past, one on his right foot about 6 years ago and another one on his left foot when he stepped on a screw.  Both of those were treated as an outpatient antibiotics and wound care by podiatry and have healed up.  Patient does not smoke.  Denies history of CAD or CHF.  No  cerebrovascular disease.  ED Course:  The patient was noted to be febrile to 103 on presentation to the ED.  His vital signs were otherwise normal.  He was started on vancomycin and ceftriaxone and was discussed with podiatry, Dr. Sherryle Lis who is planning to come in and see the patient.  Hospital Course:  Active Problems:   Diabetic foot infection ([place])   Penetrating wound of left foot   Diabetic foot infection ( left heal  )after stepping on a nail due to diabetic neuropathy  -Patient's wound has been cleaned twice in urgent care and by podiatry, He states he received a tetanus shot -Plain films without evidence of bony erosions or OM and this is only 5 days after his initial injury -Fever 103.3 on presentation, fever resolved at discharge, blood culture no growth so far -Podiatry input appreciated, he is treated with Vanco and Zosyn in the hospital, clinically he is improving, he desires to go home and take cares of his mother who has dementia. He is cleared by podiatry to discharge home , Dr Sherryle Lis recommended doxycycline and levaquin at discharge, patient is to follow up with podiatry next week. Patient states he has enough doxycycline at home, this was just prescribed to him the day before he came to the hospital.  -Wound Care:daily dressing changes with saline wet to dry -minimal weight bearing left heal -he does has significant neuropathy, he does not feel  pain, recommend protective footwear, he reports he is getting it from podiatry's office.   Hypokalemia/hypomagnesemia Replaced K and mag, HCTZ held in the hospital He does reports chronic intermittent bilateral  lower extremity edema, so will benefit from taking HCTZ, this is resumed at discharge, he is prescribed potassium 4meq daily for 5 days, he is to follow up with pcp to check potassium level , further potassium supplement per pcp   Microcytic anemia Does not appear to have external blood loss, reports stool is brown,  reports last colonoscopy was in 2010, reports h/o acid reflux No sign of hemolysis, total bilirubin unremarkable  B12 187, folate 26.9, iron panel showed iron deficiency, reticulocyte inappropriately low, FOBT ordered but not collected, now patient desires to go home, he is advised to follow up with pcp Will provide b12 and iron supplement at discharge hgb 9.6 at discharge  Thrombocytopenia, mild Plt: 115-126-128 B12 187, folate 26.9 He did not get any heparin product in the hospital  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 Discharged on b12 supplement, follow up with pcp  No insulin-dependent type 2 diabetes, uncontrolled with hyperglycemia A1c 8.5 Reports takeTrulicity once a week, metformin at home these was held in the hospital, resumed at discharge He received ssi insulin in the hospital A.m. blood glucose 185 He reports plan to exercise, diet and weight loss for better glucose control He is to follow up with pcp   Hypertension Blood pressure stable on losartan and Minipress HCTZ held due to electrolyte abnormality, he does report chronic lower extremity edema, resume HCTZ at discharge discharge on potassium supplement F/u with pcp    History of PEA arrest due to QT prolongation from Imodium overdose , avoid QTC prolonging agent Keep potassium about 4, mag above 2 QTC 463 on admission  History of H ITT, avoid heparin  Chronic pain Confirmed with pharmacy, patient is indeed getting this, continue buprenorphine 8 mg sublingual every 8 hours as needed per home doses.  Bipolar disorder Continue amitriptyline, quetiapine, sertraline per home doses He is pleasant and cooperative in the hospital      DVT prophylaxis while in the hospital: Place and maintain sequential compression device Start: 04/26/20 1940   Code Status: He confirmed that he is a DNR Family Communication: Patient Disposition: home     Consultants:    Podiatry  Procedures:   None  Antimicrobials:   Anti-infectives (From admission, onward)   Start     Dose/Rate Route Frequency Ordered Stop   04/28/20 0000  levofloxacin (LEVAQUIN) 500 MG tablet         Oral Daily 04/28/20 0902 05/05/20 2359   04/27/20 0400  vancomycin (VANCOREADY) IVPB 1000 mg/200 mL        1,000 mg 200 mL/hr over 60 Minutes Intravenous Every 12 hours 04/26/20 1516     04/26/20 2000  piperacillin-tazobactam (ZOSYN) IVPB 3.375 g        3.375 g 12.5 mL/hr over 240 Minutes Intravenous Every 8 hours 04/26/20 1516     04/26/20 1330  vancomycin (VANCOREADY) IVPB 2000 mg/400 mL        2,000 mg 200 mL/hr over 120 Minutes Intravenous  Once 04/26/20 1249 04/26/20 1832   04/26/20 1300  piperacillin-tazobactam (ZOSYN) IVPB 3.375 g        3.375 g 100 mL/hr over 30 Minutes Intravenous  Once 04/26/20 1249 04/26/20 1442      Discharge Exam: BP (!) 145/60 (BP Location: Left Arm)   Pulse Marland Kitchen)  58   Temp 99.3 F (37.4 C) (Oral)   Resp 12   SpO2 98%   General: NAD Cardiovascular: RRR Respiratory: CTABL Extremities: Left foot dressing intact, mild edema erythema left lower extremity  Discharge Instructions You were cared for by a hospitalist during your hospital stay. If you have any questions about your discharge medications or the care you received while you were in the hospital after you are discharged, you can call the unit and asked to speak with the hospitalist on call if the hospitalist that took care of you is not available. Once you are discharged, your primary care physician will handle any further medical issues. Please note that NO REFILLS for any discharge medications will be authorized once you are discharged, as it is imperative that you return to your primary care physician (or establish a relationship with a primary care physician if you do not have one) for your aftercare needs so that they can reassess your need for medications and monitor your lab  values.  Discharge Instructions    Diet - low sodium heart healthy   Complete by: As directed    Carb modified diet   Discharge wound care:   Complete by: As directed    minimal Weight Bearing left heal -Wound Care: daily dressing changes with saline wet to dry   Increase activity slowly   Complete by: As directed      Allergies as of 04/28/2020      Reactions   Heparin Other (See Comments)   HIT Ab negative on 02/20/15, but SRA POSITIVE    Oxytetracycline Rash, Other (See Comments)   Del-mycin [erythromycin]    All mycin drugs   Other    Mycins   Sulfa Antibiotics    Sulfonamide Derivatives Rash      Medication List    STOP taking these medications   ondansetron 4 MG disintegrating tablet Commonly known as: ZOFRAN-ODT     TAKE these medications   amitriptyline 50 MG tablet Commonly known as: ELAVIL Take 50 mg by mouth at bedtime.   atorvastatin 40 MG tablet Commonly known as: LIPITOR TAKE 1 TABLET BY MOUTH DAILY AT 6 PM. What changed:   how much to take  how to take this  when to take this  additional instructions  Another medication with the same name was removed. Continue taking this medication, and follow the directions you see here.   buprenorphine 8 MG Subl SL tablet Commonly known as: SUBUTEX Place 8 mg under the tongue 3 (three) times daily.   doxycycline 100 MG tablet Commonly known as: VIBRA-TABS Take 1 tablet (100 mg total) by mouth 2 (two) times daily. What changed: additional instructions   fenofibrate 145 MG tablet Commonly known as: TRICOR Take 1 tablet (145 mg total) by mouth daily. What changed: Another medication with the same name was removed. Continue taking this medication, and follow the directions you see here.   ferrous sulfate 325 (65 FE) MG EC tablet Take 1 tablet (325 mg total) by mouth daily with breakfast.   freestyle lancets Use as instructed   hydrochlorothiazide 25 MG tablet Commonly known as: HYDRODIURIL TAKE 1  TABLET (25 MG TOTAL) BY MOUTH DAILY. What changed: how much to take   levofloxacin 500 MG tablet Commonly known as: LEVAQUIN TAKE 1 TABLET (500 MG TOTAL) BY MOUTH DAILY.   losartan 100 MG tablet Commonly known as: COZAAR Take 1 tablet (100 mg total) by mouth daily. What changed: Another medication with the  same name was removed. Continue taking this medication, and follow the directions you see here.   metFORMIN 500 MG 24 hr tablet Commonly known as: GLUCOPHAGE-XR TAKE 2 TABLETS (1,000 MG TOTAL) BY MOUTH 2 (TWO) TIMES DAILY. What changed: how much to take   omeprazole 40 MG capsule Commonly known as: PRILOSEC Take 40 mg by mouth daily.   ondansetron 4 MG tablet Commonly known as: Zofran Take 1 tablet (4 mg total) by mouth every 8 (eight) hours as needed for nausea or vomiting. What changed: Another medication with the same name was removed. Continue taking this medication, and follow the directions you see here.   potassium chloride SA 20 MEQ tablet Commonly known as: KLOR-CON Take 2 tablets (40 mEq total) by mouth daily for 5 days.   prazosin 2 MG capsule Commonly known as: MINIPRESS Take 2 mg by mouth at bedtime.   QUEtiapine 300 MG tablet Commonly known as: SEROQUEL Take 150 mg by mouth at bedtime.   sertraline 100 MG tablet Commonly known as: ZOLOFT Take 1 tablet by mouth every morning.   Syringe/Needle (Disp) 30G X 1/2" 1 ML Misc UAD - use as directed   Trulicity 2.97 LG/9.2JJ Sopn Generic drug: Dulaglutide Inject 0.75 mg into the skin once a week.   vitamin B-12 1000 MCG tablet Commonly known as: CYANOCOBALAMIN Take 1 tablet (1,000 mcg total) by mouth daily.            Discharge Care Instructions  (From admission, onward)         Start     Ordered   04/28/20 0000  Discharge wound care:       Comments: minimal Weight Bearing left heal -Wound Care: daily dressing changes with saline wet to dry   04/28/20 0848         Allergies  Allergen  Reactions  . Heparin Other (See Comments)    HIT Ab negative on 02/20/15, but SRA POSITIVE   . Oxytetracycline Rash and Other (See Comments)  . Del-Mycin [Erythromycin]     All mycin drugs  . Other     Mycins  . Sulfa Antibiotics   . Sulfonamide Derivatives Rash    Follow-up Information    Nicolette Bang, DO Follow up in 1 week(s).   Specialty: Family Medicine Why: hospital discharge follow up, repeat basic lab works including cbc/bmp, please monitor your potassium level,  blood glucose level and anemia Contact information: [address] [address] [phone]        Felipa Furnace, DPM Follow up in 1 week(s).   Specialty: Podiatry Contact information: [address] [place] [address] [phone]                The results of significant diagnostics from this hospitalization (including imaging, microbiology, ancillary and laboratory) are listed below for reference.    Significant Diagnostic Studies: DG CHEST PORT 1 VIEW  Result Date: 04/27/2020 CLINICAL DATA:  Fever and cough. EXAM: PORTABLE CHEST 1 VIEW COMPARISON:  11/26/2016 FINDINGS: The heart size is normal. The lungs are free of focal consolidations and pleural effusions. There is no pulmonary edema. IMPRESSION: No active disease. Electronically Signed   By: Nolon Nations M.D.   On: 04/27/2020 16:45   DG Foot Complete Left  Result Date: 04/26/2020 CLINICAL DATA:  Wound plantar region EXAM: LEFT FOOT - COMPLETE 3+ VIEW COMPARISON:  None. FINDINGS: Frontal, oblique, and lateral views were obtained. There is soft tissue swelling in  the dorsal midfoot region. Suggestion of air in the volar soft tissues of the mid and hindfoot. No radiopaque foreign body. No acute fracture or dislocation. There is moderate narrowing of the first MTP joint. Other joint spaces appear unremarkable. There is mild spurring in the dorsal midfoot. No erosive change or bony destruction. There are  posterior and inferior calcaneal spurs. IMPRESSION: Suggestion of soft tissue air volar to the hindfoot and midfoot without radiopaque foreign body. Soft tissue swelling dorsal midfoot. No acute fracture or dislocation. No erosive change or bony destruction. Spurring dorsal midfoot. Narrowing first MTP joint. Calcaneal spurs noted. Electronically Signed   By: Lowella Grip III M.D.   On: 04/26/2020 13:15   DG Foot Complete Left  Result Date: 04/23/2020 CLINICAL DATA:  Heel pain after stepping on a nail EXAM: LEFT FOOT - COMPLETE 3+ VIEW COMPARISON:  Prior radiographs of the left foot 01/02/2020 FINDINGS: Soft tissue laceration overlies the left heel. Multiple tiny radiopaque foreign bodies are present within the soft tissues consistent with retained foreign bodies. The largest measures less than 1 mm in size. No evidence of acute fracture or malalignment. Degenerative changes again noted throughout the midfoot. IMPRESSION: 1. Multiple tiny sub-millimeter radiopacities noted in the soft tissues overlying the plantar aspect of the heel in the region of the puncture wound consistent with tiny foreign bodies. 2. No evidence of acute fracture. Electronically Signed   By: Jacqulynn Cadet M.D.   On: 04/23/2020 16:07    Microbiology: Recent Results (from the past 240 hour(s))  Resp Panel by RT-PCR (Flu A&B, Covid) Nasopharyngeal Swab     Status: None   Collection Time: 04/26/20 12:37 PM   Specimen: Nasopharyngeal Swab; Nasopharyngeal(NP) swabs in vial transport medium  Result Value Ref Range Status   SARS Coronavirus 2 by RT PCR NEGATIVE NEGATIVE Final    Comment: (NOTE) SARS-CoV-2 target nucleic acids are NOT DETECTED.  The SARS-CoV-2 RNA is generally detectable in upper respiratory specimens during the acute phase of infection. The lowest concentration of SARS-CoV-2 viral copies this assay can detect is 138 copies/mL. A negative result does not preclude SARS-Cov-2 infection and should not be  used as the sole basis for treatment or other patient management decisions. A negative result may occur with  improper specimen collection/handling, submission of specimen other than nasopharyngeal swab, presence of viral mutation(s) within the areas targeted by this assay, and inadequate number of viral copies(<138 copies/mL). A negative result must be combined with clinical observations, patient history, and epidemiological information. The expected result is Negative.  Fact Sheet for Patients:  EntrepreneurPulse.com.au  Fact Sheet for Healthcare Providers:  IncredibleEmployment.be  This test is no t yet approved or cleared by the [place] FDA and  has been authorized for detection and/or diagnosis of SARS-CoV-2 by FDA under an Emergency Use Authorization (EUA). This EUA will remain  in effect (meaning this test can be used) for the duration of the COVID-19 declaration under Section 564(b)(1) of the Act, 21 U.S.C.section 360bbb-3(b)(1), unless the authorization is terminated  or revoked sooner.       Influenza A by PCR NEGATIVE NEGATIVE Final   Influenza B by PCR NEGATIVE NEGATIVE Final    Comment: (NOTE) The Xpert Xpress SARS-CoV-2/FLU/RSV plus assay is intended as an aid in the diagnosis of influenza from Nasopharyngeal swab specimens and should not be used as a sole basis for treatment. Nasal washings and aspirates are unacceptable for Xpert Xpress SARS-CoV-2/FLU/RSV testing.  Fact Sheet for Patients: EntrepreneurPulse.com.au  Fact  Sheet for Healthcare Providers: IncredibleEmployment.be  This test is not yet approved or cleared by the [place] and has been authorized for detection and/or diagnosis of SARS-CoV-2 by FDA under an Emergency Use Authorization (EUA). This EUA will remain in effect (meaning this test can be used) for the duration of the COVID-19 declaration under Section  564(b)(1) of the Act, 21 U.S.C. section 360bbb-3(b)(1), unless the authorization is terminated or revoked.  Performed at [facility], [place] [address], [place], [place] [address]   Blood Culture (routine x 2)     Status: None (Preliminary result)   Collection Time: 04/26/20  1:46 PM   Specimen: BLOOD RIGHT FOREARM  Result Value Ref Range Status   Specimen Description BLOOD RIGHT FOREARM  Final   Special Requests   Final    BOTTLES DRAWN AEROBIC AND ANAEROBIC Blood Culture results may not be optimal due to an inadequate volume of blood received in culture bottles   Culture   Final    NO GROWTH < 24 HOURS Performed at [facility], [place] [address], [place], [place] [address]    Report Status PENDING  Incomplete  Blood Culture (routine x 2)     Status: None (Preliminary result)   Collection Time: 04/26/20  1:51 PM   Specimen: BLOOD LEFT FOREARM  Result Value Ref Range Status   Specimen Description BLOOD LEFT FOREARM  Final   Special Requests   Final    BOTTLES DRAWN AEROBIC AND ANAEROBIC Blood Culture adequate volume   Culture   Final    NO GROWTH < 24 HOURS Performed at [facility], [place] [address] [place], [place] [address]    Report Status PENDING  Incomplete     Labs: Basic Metabolic Panel: Recent Labs  Lab 04/26/20 1351 04/27/20 0133 04/27/20 1134 04/27/20 1457 04/28/20 0122  NA 135 134*  --  134* 136  K 3.1* 3.0*  --  4.0 3.2*  CL 99 103  --  102 103  CO2 24 27  --  23 29  GLUCOSE 267* 212*  --  186* 185*  BUN 19 16  --  14 11  CREATININE 1.17 1.20  --  1.06 1.01  CALCIUM 8.7* 8.1*  --  8.1* 8.1*  MG  --   --  1.8  --  1.9   Liver Function Tests: Recent Labs  Lab 04/26/20 1351  AST 28  ALT 26  ALKPHOS 53  BILITOT 1.6*  PROT 6.8  ALBUMIN 3.3*   No results for input(s): LIPASE, AMYLASE in the last 168 hours. No results for input(s): AMMONIA in the last 168 hours. CBC: Recent Labs  Lab 04/26/20 1351 04/27/20 0133 04/28/20 0122   WBC 4.8 3.6* 3.5*  NEUTROABS 4.1  --  2.6  HGB 10.7* 9.9* 9.6*  HCT 32.7* 29.9* 28.9*  MCV 78.0* 77.5* 77.5*  PLT 115* 128* 126*   Cardiac Enzymes: No results for input(s): CKTOTAL, CKMB, CKMBINDEX, TROPONINI in the last 168 hours. BNP: BNP (last 3 results) No results for input(s): BNP in the last 8760 hours.  ProBNP (last 3 results) No results for input(s): PROBNP in the last 8760 hours.  CBG: Recent Labs  Lab 04/27/20 0634 04/27/20 1120 04/27/20 1707 04/27/20 1935 04/28/20 0636  GLUCAP 163* 177* 175* 189* 162*       Signed:  Florencia Reasons MD, PhD, FACP  [facility] 04/28/2020, 9:19 AM

## 2020-04-28 NOTE — Progress Notes (Signed)
   04/28/20 1129  AVS Discharge Documentation  AVS Discharge Instructions Including Medications Provided to patient/caregiver  Name of Person Receiving AVS Discharge Instructions Including Medications Billey Chang  Name of Clinician That Reviewed AVS Discharge Instructions Including Medications Gregary Signs, RN    Patient educated about discharge instructions and verbalized an understanding of teaching. Patient discharged to home with all personal belongings and d/c paperwork. Patient Iv's removed prior to d/c. Patient left unit via wheelchair, no issues.

## 2020-04-29 NOTE — Progress Notes (Signed)
Blood culture no growth.

## 2020-04-30 ENCOUNTER — Telehealth: Payer: Self-pay

## 2020-04-30 NOTE — Telephone Encounter (Addendum)
Transition Care Management Follow-up Telephone Call  Date of discharge and from where: 04/28/2020, [facility]   How have you been since you were released from the hospital? He said he is feeling okay except his foot is messed up.   Any questions or concerns? No  Items Reviewed:  Did the pt receive and understand the discharge instructions provided? Yes   Medications obtained and verified? Yes , he said he has all medications, including the new ones, and did not have any questions about his med regime   Other? No   Any new allergies since your discharge? No   Do you have support at home? Yes  - he explained that he lives with his parents and cares for them  Home Care and Equipment/Supplies: Were home health services ordered? no If so, what is the name of the agency? n/a  Has the agency set up a time to come to the patient's home? not applicable Were any new equipment or medical supplies ordered?  No What is the name of the medical supply agency? n/a Were you able to get the supplies/equipment? not applicable Do you have any questions related to the use of the equipment or supplies? No   He has dressing supplies for his left foot wound.  He explained that he changes it daily - he said he was instructed by the doctor to cleanse with an- orange liquid that stains ( he was not sure if it is betadine) and then wrap securely. This is not the saline wet to dry dressing that is noted on AVS.   Functional Questionnaire: (I = Independent and D = Dependent) ADLs: independent.  Trying to adhere to minimal weigh bearing left heel.  He said he has a cane and is also considering purchasing a knee scooter.   Follow up appointments reviewed:   PCP Hospital f/u appt confirmed? Yes  - Dr Juleen China 05/10/2020. He said he will call the clinic if he decides that he wants to be seen sooner.    [facility] f/u appt confirmed? Yes -podiatry 05/04/2020.    Are transportation arrangements  needed? No   If their condition worsens, is the pt aware to call PCP or go to the Emergency Dept.? Yes  Was the patient provided with contact information for the PCP's office or ED? Yes  Was to pt encouraged to call back with questions or concerns? Yes

## 2020-05-01 LAB — CULTURE, BLOOD (ROUTINE X 2)
Culture: NO GROWTH
Culture: NO GROWTH
Special Requests: ADEQUATE

## 2020-05-02 LAB — CULTURE, BLOOD (ROUTINE X 2)
Culture: NO GROWTH
Culture: NO GROWTH
Special Requests: ADEQUATE

## 2020-05-04 ENCOUNTER — Ambulatory Visit: Payer: Medicare Other | Admitting: Podiatry

## 2020-05-04 ENCOUNTER — Other Ambulatory Visit: Payer: Self-pay

## 2020-05-04 DIAGNOSIS — L97423 Non-pressure chronic ulcer of left heel and midfoot with necrosis of muscle: Secondary | ICD-10-CM | POA: Diagnosis not present

## 2020-05-04 DIAGNOSIS — E1142 Type 2 diabetes mellitus with diabetic polyneuropathy: Secondary | ICD-10-CM

## 2020-05-04 DIAGNOSIS — I999 Unspecified disorder of circulatory system: Secondary | ICD-10-CM | POA: Diagnosis not present

## 2020-05-04 MED ORDER — DOXYCYCLINE HYCLATE 100 MG PO TABS: 100.0000 mg | ORAL_TABLET | Freq: Two times a day (BID) | ORAL | 1 refills | Status: DC

## 2020-05-04 MED ORDER — DOXYCYCLINE HYCLATE 100 MG PO TABS: 100.0000 mg | ORAL_TABLET | Freq: Two times a day (BID) | ORAL | 0 refills | Status: DC

## 2020-05-09 ENCOUNTER — Encounter: Payer: Self-pay | Admitting: Podiatry

## 2020-05-09 NOTE — Progress Notes (Addendum)
Subjective:  Patient ID: [name] [name], male    DOB: 1960/12/22,  MRN: [id number]  Chief Complaint  Patient presents with  . Wound Check    Wound check    60 y.o. male presents for wound care.  Patient presents with complaint of left heel ulceration that is progressively gotten worse.  Patient states he stepped on a nail few nights ago and went to the urgent care clinic they took an x-ray did not see a nail but saw some debris.  Patient states that he is a diabetic with last A1c of 7.1.  Patient states it has progressed to gotten worse there is some redness associate with him.  No purulent drainage was noted.  He has not done anything for it he has not done any kind dressings to it.  He does not have any systemic signs of infection.   Review of Systems: Negative except as noted in the HPI. Denies N/V/F/Ch.  Past Medical History:  Diagnosis Date  . Anxiety   . Arthritis   . Bipolar 1 disorder ([place])   . Blood transfusion without reported diagnosis   . Cataract   . Chronic pain   . Depressed bipolar disorder ([place])   . Depression   . Diabetes mellitus without complication ([place])   . Diabetic neuropathy ([place])   . GERD (gastroesophageal reflux disease)   . Herpes zoster 12/05/2008   Qualifier: Diagnosis of  By: Ronnald Ramp MD, Arvid Right.   . High cholesterol   . 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  Coupled with high doses of loperamide used for pain control.  Unintentional overdose -intractable VT leading to cardiogenic shock - ECMO  . Hyperlipidemia   . Hypertension   . Neuromuscular disorder ([place])    nerve damage back, neck, and shoulder  . Neuropathy in diabetes ([place])   . Sleep apnea    has CPAP but cannot use it  . Substance abuse ([place])    in past     Current Outpatient Medications:  .  doxycycline (VIBRA-TABS) 100 MG tablet, Take 1 tablet (100 mg total) by mouth 2 (two) times daily., Disp: 20 tablet, Rfl:  1 .  amitriptyline (ELAVIL) 50 MG tablet, Take 50 mg by mouth at bedtime., Disp: , Rfl:  .  atorvastatin (LIPITOR) 40 MG tablet, TAKE 1 TABLET BY MOUTH DAILY AT 6 PM. (Patient taking differently: Take 40 mg by mouth daily.), Disp: 30 tablet, Rfl: 5 .  buprenorphine (SUBUTEX) 8 MG SUBL SL tablet, Place 8 mg under the tongue 3 (three) times daily., Disp: , Rfl:  .  doxycycline (VIBRA-TABS) 100 MG tablet, Take 1 tablet (100 mg total) by mouth 2 (two) times daily., Disp: 20 tablet, Rfl: 0 .  Dulaglutide (TRULICITY) 4.27 CW/2.3JS SOPN, Inject 0.75 mg into the skin once a week., Disp: 2 mL, Rfl: 2 .  fenofibrate (TRICOR) 145 MG tablet, Take 1 tablet (145 mg total) by mouth daily., Disp: 30 tablet, Rfl: 5 .  ferrous sulfate 325 (65 FE) MG EC tablet, Take 1 tablet (325 mg total) by mouth daily with breakfast., Disp: 30 tablet, Rfl: 0 .  hydrochlorothiazide (HYDRODIURIL) 25 MG tablet, TAKE 1 TABLET (25 MG TOTAL) BY MOUTH DAILY. (Patient taking differently: Take 25 mg by mouth daily.), Disp: 30 tablet, Rfl: 5 .  Lancets (FREESTYLE) lancets, Use as instructed, Disp: , Rfl:  .  losartan (COZAAR) 100 MG tablet, Take 1 tablet (100 mg total) by  mouth daily., Disp: 30 tablet, Rfl: 5 .  metFORMIN (GLUCOPHAGE-XR) 500 MG 24 hr tablet, TAKE 2 TABLETS (1,000 MG TOTAL) BY MOUTH 2 (TWO) TIMES DAILY. (Patient taking differently: Take 1,000 mg by mouth 2 (two) times daily.), Disp: 120 tablet, Rfl: 5 .  omeprazole (PRILOSEC) 40 MG capsule, Take 40 mg by mouth daily., Disp: , Rfl:  .  ondansetron (ZOFRAN) 4 MG tablet, Take 1 tablet (4 mg total) by mouth every 8 (eight) hours as needed for nausea or vomiting., Disp: 20 tablet, Rfl: 0 .  potassium chloride SA (KLOR-CON) 20 MEQ tablet, Take 2 tablets (40 mEq total) by mouth daily for 5 days., Disp: 10 tablet, Rfl: 0 .  prazosin (MINIPRESS) 2 MG capsule, Take 2 mg by mouth at bedtime., Disp: , Rfl:  .  QUEtiapine (SEROQUEL) 300 MG tablet, Take 150 mg by mouth at bedtime., Disp: ,  Rfl:  .  sertraline (ZOLOFT) 100 MG tablet, Take 1 tablet by mouth every morning., Disp: , Rfl:  .  Syringe/Needle, Disp, 30G X 1/2" 1 ML MISC, UAD - use as directed, Disp: 100 each, Rfl: 5 .  vitamin B-12 (CYANOCOBALAMIN) 1000 MCG tablet, Take 1 tablet (1,000 mcg total) by mouth daily., Disp: 30 tablet, Rfl: 0  Social History   Tobacco Use  Smoking Status Never Smoker  Smokeless Tobacco Never Used    Allergies  Allergen Reactions  . Heparin Other (See Comments)    HIT Ab negative on 02/20/15, but SRA POSITIVE   . Oxytetracycline Rash and Other (See Comments)  . Del-Mycin [Erythromycin]     All mycin drugs  . Other     Mycins  . Sulfa Antibiotics   . Sulfonamide Derivatives Rash   Objective:   There were no vitals filed for this visit. There is no height or weight on file to calculate BMI. Constitutional Well developed. Well nourished.  Vascular Dorsalis pedis pulses faintly palpable bilaterally. Posterior tibial pulses faintly palpable bilaterally. Capillary refill normal to all digits.  No cyanosis or clubbing noted. Pedal hair growth normal.  Neurologic Normal speech. Oriented to person, place, and time. Protective sensation absent  Dermatologic Wound Location: Left heel ulceration probing down to deep tissue Wound Base: Mixed Granular/Fibrotic Peri-wound: Reddened Exudate: Scant/small amount Serosanguinous exudate Wound Measurements: -See below  Orthopedic: No pain to palpation either foot.   Radiographs: 3 views of skeletally mature the left foot: Some debris noted however no nail noted.  Soft tissue defect noted to the heel.  No signs of osteomyelitis noted.  No cortical destruction noted. Assessment:   1. Vascular abnormality   2. Diabetic polyneuropathy associated with type 2 diabetes mellitus ([place])   3. Heel ulcer, left, with necrosis of muscle ([place])    Plan:  Patient was evaluated and treated and all questions answered.  Ulcer left heel ulceration  probing down to deep tissues/capsule with soft tissue infection -Debridement as below. -Dressed with Betadine wet-to-dry, DSD. -Continue off-loading with cam boot -Doxycycline was dispensed for skin and soft tissue prophylaxis I plan on keeping him on doxycycline until resolvent of the wound. -Given the location of the wound patient is a high risk of major amputation if the wound further regresses.  Given his A1c of 7.1 and the difficulty of healing the wound I discussed with the patient that he is at high risk of amputation.  He states understanding. -ABIs PVRs were ordered to assess the vascular flow to the left lower extremity  Procedure: Excisional Debridement of Wound~stagnant Tool:  Sharp chisel blade/tissue nipper Rationale: Removal of non-viable soft tissue from the wound to promote healing.  Anesthesia: none Pre-Debridement Wound Measurements: 3 cm x 2.5 cm x 0.7 cm  Post-Debridement Wound Measurements: 3.2 cm x 2.7 cm x 0.7 cm  Type of Debridement: Sharp Excisional Tissue Removed: Non-viable soft tissue Blood loss: Minimal (<50cc) Depth of Debridement: Deep tissue/capsule Technique: Sharp excisional debridement to bleeding, viable wound base.  Wound Progress: The wound appears about the same as previous measurement. Dressing: Dry, sterile, compression dressing. Disposition: Patient tolerated procedure well. Patient to return in 1 week for follow-up.  No follow-ups on file.

## 2020-05-09 NOTE — Addendum Note (Signed)
Addended by: Boneta Lucks on: 05/09/2020 10:31 AM   Modules accepted: Level of Service

## 2020-05-10 ENCOUNTER — Other Ambulatory Visit: Payer: Self-pay

## 2020-05-10 ENCOUNTER — Encounter: Payer: Self-pay | Admitting: Internal Medicine

## 2020-05-10 ENCOUNTER — Ambulatory Visit (INDEPENDENT_AMBULATORY_CARE_PROVIDER_SITE_OTHER): Payer: Medicare Other | Admitting: Internal Medicine

## 2020-05-10 VITALS — BP 133/76 | HR 63 | Resp 16 | Wt 231.0 lb

## 2020-05-10 DIAGNOSIS — E1142 Type 2 diabetes mellitus with diabetic polyneuropathy: Secondary | ICD-10-CM

## 2020-05-10 DIAGNOSIS — E876 Hypokalemia: Secondary | ICD-10-CM | POA: Diagnosis not present

## 2020-05-10 DIAGNOSIS — S91332D Puncture wound without foreign body, left foot, subsequent encounter: Secondary | ICD-10-CM | POA: Diagnosis not present

## 2020-05-10 DIAGNOSIS — Z09 Encounter for follow-up examination after completed treatment for conditions other than malignant neoplasm: Secondary | ICD-10-CM | POA: Diagnosis not present

## 2020-05-10 DIAGNOSIS — D509 Iron deficiency anemia, unspecified: Secondary | ICD-10-CM

## 2020-05-10 LAB — GLUCOSE, POCT (MANUAL RESULT ENTRY): POC Glucose: 148 mg/dl — AB (ref 70–99)

## 2020-05-10 NOTE — Progress Notes (Addendum)
Subjective:    [name] [name] - 60 y.o. male MRN [id number]  Date of birth: 21-Oct-1960  HPI  [name] [name] is here for hospital follow up. Patient was hospitalized from 4/14 to 4/16 for diabetic foot infection. Patient reports continued compliance with Doxycyline. He has no adverse effects from medication. He has no feeling in the bottom of his feet. Has been keeping the area bandaged but reports difficult for him to look at the spot himself. No fevers, nausea, vomiting or chills. Has noted no redness to the foot or ankle. Sees podiatry next week for follow up.   Copied from Hospital D/C Summary: Diabetic foot infection( left heal  )after stepping on a nail due to diabetic neuropathy  -Patient's wound has been cleaned twice in urgent care and by podiatry,He states he received a tetanus shot -Plain films without evidence of bony erosions or OM and this is only 5 days after his initial injury -Fever103.3 on presentation, fever resolved at discharge, blood culture no growth so far -Podiatry input appreciated,he is treated with Vanco and Zosyn in the hospital, clinically he is improving, he desires to go home and take cares of his mother who has dementia. He is cleared by podiatry to discharge home , Dr Sherryle Lis recommended doxycycline and levaquin at discharge, patient is to follow up with podiatry next week. Patient states he has enough doxycycline at home, this was just prescribed to him the day before he came to the hospital.  -Wound Care:daily dressing changes with saline wet to dry -minimal weight bearing left heal -he does has significant neuropathy, he does not feel pain, recommend protective footwear, he reports he is getting it from podiatry's office.   Hypokalemia/hypomagnesemia Replaced K and mag,HCTZ held in the hospital He does reports chronic intermittent bilateral  lower extremity edema, so will benefit from taking HCTZ, this is resumed at discharge, he is prescribed  potassium 70meq daily for 5 days, he is to follow up with pcp to check potassium level , further potassium supplement per pcp   Microcytic anemia Does not appear to have external blood loss, reports stool is brown, reports last colonoscopy was in 2010, reports h/o acid reflux No sign of hemolysis,total bilirubin unremarkable  B12 187, folate 26.9, iron panel showed iron deficiency, reticulocyte inappropriately low, FOBT ordered but not collected, now patient desires to go home, he is advised to follow up with pcp Will provide b12 and iron supplement at discharge hgb 9.6 at discharge  Thrombocytopenia, mild Plt: 115-126-128 B12 187, folate 26.9 He did not get any heparin product in the hospital 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 Discharged on b12 supplement, follow up with pcp  No insulin-dependent type 2 diabetes,uncontrolled with hyperglycemia A1c 8.5 Reports takeTrulicity once a week,metformin at home these was held in the hospital, resumed at discharge He received ssi insulin in the hospital A.m. blood glucose185 He reports plan to exercise, diet and weight loss for better glucose control He is to follow up with pcp   Hypertension Blood pressure stable on losartan and Minipress HCTZ held due to electrolyte abnormality,he does report chronic lower extremity edema,resume HCTZ at discharge discharge on potassium supplement F/u with pcp    History of PEA arrest due to QT prolongation from Imodium overdose,avoid QTC prolonging agent Keep potassium about 4,mag above 2 QTC 463 on admission  History of H ITT,avoid heparin  Chronic pain Confirmed with pharmacy, patient is indeed getting this, continue buprenorphine 8 mg sublingual  every 8 hours as needed per home doses.  Bipolar disorder Continue amitriptyline, quetiapine, sertraline per home doses He is pleasant and cooperative in the hospital   Health Maintenance:  Health Maintenance Due  Topic  Date Due  . COLONOSCOPY (Pts 45-84yrs Insurance coverage will need to be confirmed)  08/31/2015  . OPHTHALMOLOGY EXAM  09/04/2017  . COVID-19 Vaccine (2 - Booster for [employer] series) 10/03/2019  . FOOT EXAM  03/03/2020    -  reports that he has never smoked. He has never used smokeless tobacco. - Review of Systems: Per HPI. - Past Medical History: Patient Active Problem List   Diagnosis Date Noted  . Diabetic foot infection ([place]) 04/26/2020  . Penetrating wound of left foot   . Skin lesion 12/07/2019  . Chronic pain disorder 09/13/2019  . DDD (degenerative disc disease), lumbar 09/13/2019  . Dyslipidemia 09/13/2019  . Obesity 09/13/2019  . Peripheral neuropathy 09/13/2019  . Sleep apnea   . Prolonged Q-T interval on ECG 12/03/2016  . Drug-induced torsades de pointes ([place]) 12/03/2016  . Thrombocytopenia ([place]) 11/29/2016  . Personal history of ECMO 11/26/2016  . Personal history of spine surgery 11/26/2016  . S/P rotator cuff repair 11/26/2016  . 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 Mild concentric left ventricular hypertrophy (LVH) 09/11/2016  . DJD (degenerative joint disease) of cervical spine 02/26/2016  . Tinea pedis of both feet 06/03/2015  . Pre-ulcerative calluses 06/01/2015  . Type 2 diabetes mellitus with diabetic polyneuropathy ([place]) 06/09/2014  . Generalized anxiety disorder 06/19/2012  . Herpes zoster 12/05/2008  . Mixed hyperlipidemia 05/11/2008  . DEPRESSION 05/11/2008  . Essential hypertension 05/11/2008  . GERD 05/11/2008  . OSTEOARTHRITIS 05/11/2008  . LOW BACK PAIN 05/11/2008  . COLONIC POLYPS, HX OF 05/11/2008   - Medications: reviewed and updated   Objective:   Physical Exam BP 133/76   Pulse 63   Resp 16   Wt 231 lb (104.8 kg)   SpO2 97%   BMI 29.66 kg/m  Physical Exam Constitutional:      General: He is not in acute distress.    Appearance: He is not diaphoretic.  Cardiovascular:     Rate  and Rhythm: Normal rate.  Pulmonary:     Effort: Pulmonary effort is normal. No respiratory distress.  Musculoskeletal:        General: Normal range of motion.  Skin:    General: Skin is warm and dry.     Comments: Left heel with wound that appears to have healing edges and new tissue growth without surrounding erythema of the skin.   Neurological:     Mental Status: He is alert and oriented to person, place, and time.  Psychiatric:        Mood and Affect: Affect normal.        Judgment: Judgment normal.            Assessment & Plan:   1. Hospital discharge follow-up Reviewed hospital course, current medications, ensured proper f/u in place, and addressed concerns.   2. Type 2 diabetes mellitus with diabetic polyneuropathy, unspecified whether long term insulin use (HCC) Last A1c obtained during hospitalization was 8.5, previously 7.1 at this office in Jan 2022. Will need separate visit to focus on diabetic control. Discussed with patient that wound healing can be impacted by poor glucose control. Continue current regimen. Emphasized diet.  - POCT glucose (manual entry)  3. Hypokalemia K 3.2 prior to d/c. He was  prescribed potassium 2meq daily for 5 days, which he completed. Has resumed HCTZ since d/c as instructed. Monitor.  - Basic Metabolic Panel  4. Hypomagnesemia Mg 1.9 at d/c. Monitor.  - Magnesium  5. Microcytic anemia HgB 9.6, iron 21, Vit B12 187 at d/c. Compliant with Fe and B12 supplements. Discussed can take a while to rebuild stores. Will monitor levels today but anticipate will still be low as just started supplementation 2 weeks prior.  - Vitamin B12 - Iron, TIBC and Ferritin Panel - CBC  6. Penetrating wound of left foot, subsequent encounter Appears to be healing. Continue Doxy. Follow up with podiatry. Have recommended he obtain diabetic shoes with hard sole.      Phill Myron, D.O. 05/10/2020, 8:52 AM Primary Care at [facility]  Subjective:    [name] [name] - 60 y.o. male MRN [id number]  Date of birth: 11-30-1960  HPI  BRIGHAM COBBINS is here for hospital follow up. Patient was hospitalized from 4/14 to 4/16 for diabetic foot infection. Patient reports continued compliance with Doxycyline. He has no adverse effects from medication. He has no feeling in the bottom of his feet. Has been keeping the area bandaged but reports difficult for him to look at the spot himself. No fevers, nausea, vomiting or chills. Has noted no redness to the foot or ankle. Sees podiatry next week for follow up.   Copied from Hospital D/C  Summary: Diabetic foot infection( left heal  )after stepping on a nail due to diabetic neuropathy  -Patient's wound has been cleaned twice in urgent care and by podiatry,He states he received a tetanus shot -Plain films without evidence of bony erosions or OM and this is only 5 days after his initial injury -Fever103.3 on presentation, fever resolved at discharge, blood culture no growth so far -Podiatry input appreciated,he is treated with Vanco and Zosyn in the hospital, clinically he is improving, he desires to go home and take cares of his mother who has dementia. He is cleared by podiatry to discharge home , Dr Sherryle Lis recommended doxycycline and levaquin at discharge, patient is to follow up with podiatry next week. Patient states he has enough doxycycline at home, this was just prescribed to him the day before he came to the hospital.  -Wound Care:daily dressing changes with saline wet to dry -minimal weight bearing left heal -he does has significant neuropathy, he does not feel pain, recommend protective footwear, he reports he is getting it from podiatry's office.   Hypokalemia/hypomagnesemia Replaced K and mag,HCTZ held in the hospital He does reports chronic intermittent bilateral  lower extremity edema, so will benefit from taking HCTZ, this is resumed at discharge, he is prescribed potassium 61meq daily for 5 days, he is to follow up with pcp to check potassium level , further potassium supplement per pcp   Microcytic anemia Does not appear to have external blood loss, reports stool is brown, reports last colonoscopy was in 2010, reports h/o acid reflux No sign of hemolysis,total bilirubin unremarkable  B12 187, folate 26.9, iron panel showed iron deficiency, reticulocyte inappropriately low, FOBT ordered but not collected, now patient desires to go home, he is advised to follow up with pcp Will provide b12 and iron supplement at discharge hgb 9.6 at  discharge  Thrombocytopenia, mild Plt: 115-126-128 B12 187, folate 26.9 He did not get any heparin product in the hospital 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 Discharged on b12 supplement, follow up with pcp  No insulin-dependent type 2 diabetes,uncontrolled with hyperglycemia A1c 8.5 Reports takeTrulicity once a week,metformin at home these was held in the hospital, resumed at discharge He received ssi insulin in the hospital A.m. blood glucose185 He reports plan to exercise, diet and weight loss for better glucose control He is to follow up with pcp   Hypertension Blood pressure stable on losartan and Minipress HCTZ held due to electrolyte abnormality,he does report chronic lower extremity edema,resume HCTZ at discharge discharge on potassium supplement F/u with pcp    History of PEA arrest due to QT prolongation from Imodium overdose,avoid QTC prolonging agent Keep potassium about 4,mag above 2 QTC 463 on admission  History of H ITT,avoid heparin  Chronic pain Confirmed with pharmacy, patient is indeed getting this, continue buprenorphine 8 mg  sublingual every 8 hours as needed per home doses.  Bipolar disorder Continue amitriptyline, quetiapine, sertraline per home doses He is pleasant and cooperative in the hospital   Health Maintenance:  Health Maintenance Due  Topic Date Due  . COLONOSCOPY (Pts 45-58yrs Insurance coverage will need to be confirmed)  08/31/2015  . OPHTHALMOLOGY EXAM  09/04/2017  . COVID-19 Vaccine (2 - Booster for [employer] series) 10/03/2019  . FOOT EXAM  03/03/2020    -  reports that he has never smoked. He has never used smokeless tobacco. - Review of Systems: Per HPI. - Past Medical History: Patient Active Problem List   Diagnosis Date Noted  . Diabetic foot infection ([place]) 04/26/2020  . Penetrating wound of left foot   . Skin lesion 12/07/2019  . Chronic pain disorder 09/13/2019  . DDD (degenerative disc  disease), lumbar 09/13/2019  . Dyslipidemia 09/13/2019  . Obesity 09/13/2019  . Peripheral neuropathy 09/13/2019  . Sleep apnea   . Prolonged Q-T interval on ECG 12/03/2016  . Drug-induced torsades de pointes ([place]) 12/03/2016  . Thrombocytopenia ([place]) 11/29/2016  . Personal history of ECMO 11/26/2016  . Personal history of spine surgery 11/26/2016  . S/P rotator cuff repair 11/26/2016  . 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 Mild concentric left ventricular hypertrophy (LVH) 09/11/2016  . DJD (degenerative joint disease) of cervical spine 02/26/2016  . Tinea pedis of both feet 06/03/2015  . Pre-ulcerative calluses 06/01/2015  . Type 2 diabetes mellitus with diabetic polyneuropathy ([place]) 06/09/2014  . Generalized anxiety disorder 06/19/2012  . Herpes zoster 12/05/2008  . Mixed hyperlipidemia 05/11/2008  . DEPRESSION 05/11/2008  . Essential hypertension 05/11/2008  . GERD 05/11/2008  . OSTEOARTHRITIS 05/11/2008  . LOW BACK PAIN 05/11/2008  . COLONIC POLYPS, HX OF 05/11/2008   - Medications: reviewed and updated   Objective:   Physical Exam BP 133/76   Pulse 63   Resp 16   Wt 231 lb (104.8 kg)   SpO2 97%   BMI 29.66 kg/m  Physical Exam Constitutional:      General: He is not in acute distress.    Appearance: He is not diaphoretic.  Cardiovascular:     Rate and Rhythm: Normal rate.  Pulmonary:     Effort: Pulmonary effort is normal. No respiratory distress.  Musculoskeletal:        General: Normal range of motion.  Skin:    General: Skin is warm and dry.     Comments: Left heel with wound that appears to have healing edges and new tissue growth without surrounding erythema of the skin.   Neurological:     Mental Status: He is alert and oriented to person, place, and time.  Psychiatric:        Mood and Affect: Affect normal.        Judgment: Judgment normal.            Assessment & Plan:   1. Hospital discharge  follow-up Reviewed hospital course, current medications, ensured proper f/u in place, and addressed concerns.   2. Type 2 diabetes mellitus with diabetic polyneuropathy, unspecified whether long term insulin use (HCC) Last A1c obtained during hospitalization was 8.5, previously 7.1 at this office in Jan 2022. Will need separate visit to focus on diabetic control. Discussed with patient that wound healing can be impacted by poor glucose control. Continue current regimen. Emphasized diet.  - POCT glucose (manual entry)  3. Hypokalemia K 3.2 prior to d/c. He  was prescribed potassium 59meq daily for 5 days, which he completed. Has resumed HCTZ since d/c as instructed. Monitor.  - Basic Metabolic Panel  4. Hypomagnesemia Mg 1.9 at d/c. Monitor.  - Magnesium  5. Microcytic anemia HgB 9.6, iron 21, Vit B12 187 at d/c. Compliant with Fe and B12 supplements. Discussed can take a while to rebuild stores. Will monitor levels today but anticipate will still be low as just started supplementation 2 weeks prior.  - Vitamin B12 - Iron, TIBC and Ferritin Panel - CBC  6. Penetrating wound of left foot, subsequent encounter Appears to be healing. Continue Doxy. Follow up with podiatry. Have recommended he obtain diabetic shoes with hard sole.      Phill Myron, D.O. 05/10/2020, 8:52 AM Primary Care at [facility]

## 2020-05-10 NOTE — Progress Notes (Signed)
Pt states he is also having some neuropathy pain

## 2020-05-11 ENCOUNTER — Ambulatory Visit (HOSPITAL_COMMUNITY)
Admission: RE | Admit: 2020-05-11 | Discharge: 2020-05-11 | Disposition: A | Discharge status: Home / Self Care | Payer: Medicare Other | Source: Ambulatory Visit | Attending: Podiatry | Admitting: Podiatry

## 2020-05-11 DIAGNOSIS — I999 Unspecified disorder of circulatory system: Secondary | ICD-10-CM | POA: Diagnosis not present

## 2020-05-11 LAB — IRON,TIBC AND FERRITIN PANEL
Ferritin: 53 ng/mL (ref 30–400)
Iron Saturation: 22 % (ref 15–55)
Iron: 72 ug/dL (ref 38–169)
Total Iron Binding Capacity: 326 ug/dL (ref 250–450)
UIBC: 254 ug/dL (ref 111–343)

## 2020-05-11 LAB — CBC
Hematocrit: 34.3 % — ABNORMAL LOW (ref 37.5–51.0)
Hemoglobin: 11.6 g/dL — ABNORMAL LOW (ref 13.0–17.7)
MCH: 26.4 pg — ABNORMAL LOW (ref 26.6–33.0)
MCHC: 33.8 g/dL (ref 31.5–35.7)
MCV: 78 fL — ABNORMAL LOW (ref 79–97)
Platelets: 242 10*3/uL (ref 150–450)
RBC: 4.39 x10E6/uL (ref 4.14–5.80)
RDW: 15.3 % (ref 11.6–15.4)
WBC: 4 10*3/uL (ref 3.4–10.8)

## 2020-05-11 LAB — BASIC METABOLIC PANEL
BUN/Creatinine Ratio: 21 — ABNORMAL HIGH (ref 9–20)
BUN: 17 mg/dL (ref 6–24)
CO2: 22 mmol/L (ref 20–29)
Calcium: 9.4 mg/dL (ref 8.7–10.2)
Chloride: 102 mmol/L (ref 96–106)
Creatinine, Ser: 0.8 mg/dL (ref 0.76–1.27)
Glucose: 129 mg/dL — ABNORMAL HIGH (ref 65–99)
Potassium: 3.9 mmol/L (ref 3.5–5.2)
Sodium: 140 mmol/L (ref 134–144)
eGFR: 103 mL/min/{1.73_m2} (ref >59)

## 2020-05-11 LAB — VITAMIN B12: Vitamin B-12: 553 pg/mL (ref 232–1245)

## 2020-05-11 LAB — MAGNESIUM: Magnesium: 1.9 mg/dL (ref 1.6–2.3)

## 2020-05-14 ENCOUNTER — Telehealth: Payer: Self-pay | Admitting: Internal Medicine

## 2020-05-14 NOTE — Telephone Encounter (Signed)
Pt returning call about lab results. Pt states he does not do MyChart. Please call pt back.

## 2020-05-15 ENCOUNTER — Other Ambulatory Visit: Payer: Self-pay | Admitting: Podiatry

## 2020-05-15 DIAGNOSIS — L97424 Non-pressure chronic ulcer of left heel and midfoot with necrosis of bone: Secondary | ICD-10-CM

## 2020-05-18 ENCOUNTER — Other Ambulatory Visit: Payer: Self-pay

## 2020-05-18 ENCOUNTER — Ambulatory Visit: Payer: Medicare Other | Admitting: Podiatry

## 2020-05-18 ENCOUNTER — Ambulatory Visit: Payer: Medicare Other

## 2020-05-18 DIAGNOSIS — M86472 Chronic osteomyelitis with draining sinus, left ankle and foot: Secondary | ICD-10-CM | POA: Diagnosis not present

## 2020-05-18 DIAGNOSIS — L97423 Non-pressure chronic ulcer of left heel and midfoot with necrosis of muscle: Secondary | ICD-10-CM

## 2020-05-18 DIAGNOSIS — E1142 Type 2 diabetes mellitus with diabetic polyneuropathy: Secondary | ICD-10-CM | POA: Diagnosis not present

## 2020-05-21 ENCOUNTER — Other Ambulatory Visit: Payer: Self-pay

## 2020-05-21 ENCOUNTER — Ambulatory Visit
Admission: RE | Admit: 2020-05-21 | Discharge: 2020-05-21 | Disposition: A | Discharge status: Home / Self Care | Payer: Medicare Other | Source: Ambulatory Visit | Attending: Podiatry | Admitting: Podiatry

## 2020-05-21 DIAGNOSIS — E11621 Type 2 diabetes mellitus with foot ulcer: Secondary | ICD-10-CM | POA: Diagnosis not present

## 2020-05-21 DIAGNOSIS — L97429 Non-pressure chronic ulcer of left heel and midfoot with unspecified severity: Secondary | ICD-10-CM | POA: Diagnosis not present

## 2020-05-21 DIAGNOSIS — L97424 Non-pressure chronic ulcer of left heel and midfoot with necrosis of bone: Secondary | ICD-10-CM

## 2020-05-21 HISTORY — PX: IR RADIOLOGIST EVAL & MGMT: IMG5224

## 2020-05-21 NOTE — Consult Note (Signed)
Chief Complaint: Left heel wound  Referring Physician(s): Patel,Kevin P  History of Present Illness: [name] [name] is a 60 y.o. male [facility] as [facility] consultation, kindly referred by Dr. Posey Pronto of [facility], for evaluation for possible candidacy for lower extremity angiogram/intervention.   Mr Pallas joins [place] today by telemedicine visit.  We confirmed his identity with 2 personal identifiers.   He tells me that about 3-4 weeks ago he stepped on a nail, sustained an injury to his left heel.  He went to urgent care, who referred him to his podiatry team.  He tells me he did not feel the injury, and in fact had a similar injury with a small nail in the forefoot of his left foot in December, which healed without any advanced care.  I cannot elicit any claudication history.   He has recently moved in with his parents to help care for his aging mother, who has the diagnosis of Alzheimer's.  He wishes to maintain his capability and his limbs, for quality of life and ability.  He certainly does not want to be at risk for amputation/limb loss.    He denies any prior MI or stroke.  He denies resting chest pain.    He is a never smoker and denies any usage of marijuana.    Non-invasive exam 05/11/20 Right: ABI: 1.24 Left: ABI: 1.17, TBI: 0.64 Segmental shows some artifact of the waveforms on the left, which may be secondary to hyperemia.  In particular the PT distribution, which is the region of interest given the heel wound.   Past Medical History:  Diagnosis Date  . Anxiety   . Arthritis   . Bipolar 1 disorder ([place])   . Blood transfusion without reported diagnosis   . Cataract   . Chronic pain   . Depressed bipolar disorder ([place])   . Depression   . Diabetes mellitus without complication ([place])   . Diabetic neuropathy ([place])   . GERD (gastroesophageal reflux disease)   . Herpes zoster 12/05/2008   Qualifier: Diagnosis of  By: Ronnald Ramp  MD, Arvid Right.   . High cholesterol   . 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  Coupled with high doses of loperamide used for pain control.  Unintentional overdose -intractable VT leading to cardiogenic shock - ECMO  . Hyperlipidemia   . Hypertension   . Neuromuscular disorder ([place])    nerve damage back, neck, and shoulder  . Neuropathy in diabetes ([place])   . Sleep apnea    has CPAP but cannot use it  . Substance abuse ([place])    in past     Past Surgical History:  Procedure Laterality Date  . COLONOSCOPY    . 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 INTRAOPERATIVE TRANSESOPHAGEAL ECHOCARDIOGRAM N/A 11/26/2016   Procedure: INTRAOPERATIVE TRANSESOPHAGEAL ECHOCARDIOGRAM;  Surgeon: Ivin Poot, MD;  Location: [facility];  Service: Open Heart Surgery;  Laterality: N/A;  . POLYPECTOMY    . SHOULDER ARTHROSCOPY WITH ROTATOR CUFF REPAIR Right 2009  . [place]  2010  . TRANSTHORACIC ECHOCARDIOGRAM  07/2016; 12/10/2016   a. Prior to VT arrest: normal. EF 55-60%. Gr 1 DD.  mild LVH. Mildly dilated Aortic Root.;; b.  Normal LV size and function.  Mild LVH.  EF 55%.  No RWMA.  No valve abnormalities.    Allergies: Heparin, Oxytetracycline, Del-mycin [erythromycin], Other, Sulfa antibiotics, and Sulfonamide derivatives  Medications: Prior to Admission medications   Medication Sig Start Date End Date Taking? Authorizing Provider  amitriptyline (ELAVIL) 50 MG tablet Take 50 mg by mouth at bedtime.    [provider]  atorvastatin (LIPITOR) 40 MG tablet TAKE 1 TABLET BY MOUTH DAILY AT 6 PM. Patient taking differently: Take 40 mg by mouth daily. 11/09/19   Argentina Donovan, PA-C  buprenorphine (SUBUTEX) 8 MG SUBL SL tablet Place 8 mg under the tongue 3 (three) times daily.    [provider]  doxycycline  (VIBRA-TABS) 100 MG tablet Take 1 tablet (100 mg total) by mouth 2 (two) times daily. 05/04/20   Felipa Furnace, DPM  Dulaglutide (TRULICITY) 0.35 KK/9.3GH SOPN Inject 0.75 mg into the skin once a week. 11/09/19   Argentina Donovan, PA-C  fenofibrate (TRICOR) 145 MG tablet Take 1 tablet (145 mg total) by mouth daily. 11/09/19   Argentina Donovan, PA-C  ferrous sulfate 325 (65 FE) MG EC tablet Take 1 tablet (325 mg total) by mouth daily with breakfast. 04/28/20 05/28/20  Florencia Reasons, MD  hydrochlorothiazide (HYDRODIURIL) 25 MG tablet TAKE 1 TABLET (25 MG TOTAL) BY MOUTH DAILY. Patient taking differently: Take 25 mg by mouth daily. 11/09/19 11/08/20  Argentina Donovan, PA-C  Lancets (FREESTYLE) lancets Use as instructed 05/24/14   [provider]  losartan (COZAAR) 100 MG tablet Take 1 tablet (100 mg total) by mouth daily. 11/09/19   Argentina Donovan, PA-C  metFORMIN (GLUCOPHAGE-XR) 500 MG 24 hr tablet TAKE 2 TABLETS (1,000 MG TOTAL) BY MOUTH 2 (TWO) TIMES DAILY. Patient taking differently: Take 1,000 mg by mouth 2 (two) times daily. 02/13/20 02/12/21  Nicolette Bang, DO  omeprazole (PRILOSEC) 40 MG capsule Take 40 mg by mouth daily.    [provider]  ondansetron (ZOFRAN) 4 MG tablet Take 1 tablet (4 mg total) by mouth every 8 (eight) hours as needed for nausea or vomiting. 04/24/20   Felipa Furnace, DPM  potassium chloride SA (KLOR-CON) 20 MEQ tablet Take 2 tablets (40 mEq total) by mouth daily for 5 days. 04/28/20 05/03/20  Florencia Reasons, MD  prazosin (MINIPRESS) 2 MG capsule Take 2 mg by mouth at bedtime.    [provider]  QUEtiapine (SEROQUEL) 300 MG tablet Take 150 mg by mouth at bedtime.    [provider]  sertraline (ZOLOFT) 100 MG tablet Take 1 tablet by mouth every morning. 04/09/20   [provider]  Syringe/Needle, Disp, 30G X 1/2" 1 ML MISC UAD - use as directed 12/29/18   Ladell Pier, MD  vitamin B-12 (CYANOCOBALAMIN) 1000 MCG tablet Take  1 tablet (1,000 mcg total) by mouth daily. 04/28/20   Florencia Reasons, MD     Family History  Problem Relation Age of Onset  . Diabetes Mother   . Hyperlipidemia Mother   . Heart disease Father   . Colon cancer Neg Hx   . Colon polyps Neg Hx   . Esophageal cancer Neg Hx   . Rectal cancer Neg Hx   . Stomach cancer Neg Hx     Social History   Socioeconomic History  . Marital status: Divorced    Spouse name: Not on file  . Number of children: 3  . Years of education: Not on file  . Highest education level: Not on file  Occupational History  . Occupation: Disabled  Tobacco Use  . Smoking status: Never Smoker  . Smokeless tobacco: Never Used  Substance and Sexual Activity  . Alcohol use: Not Currently  . Drug use: No  . Sexual activity: Not Currently  Other Topics Concern  . Not on file  Social History Narrative   ** Merged History Encounter **       Social Determinants of Health   Financial Resource Strain: Not on file  Food Insecurity: Not on file  Transportation Needs: Not on file  Physical Activity: Not on file  Stress: Not on file  Social Connections: Not on file       Review of Systems  Review of Systems: A 12 point ROS discussed and pertinent positives are indicated in the HPI above.  All other systems are negative.  Physical Exam No direct physical exam was performed (except for noted visual exam findings with Video Visits).     Vital Signs: There were no vitals taken for this visit.  Imaging: DG CHEST PORT 1 VIEW  Result Date: 04/27/2020 CLINICAL DATA:  Fever and cough. EXAM: PORTABLE CHEST 1 VIEW COMPARISON:  11/26/2016 FINDINGS: The heart size is normal. The lungs are free of focal consolidations and pleural effusions. There is no pulmonary edema. IMPRESSION: No active disease. Electronically Signed   By: Nolon Nations M.D.   On: 04/27/2020 16:45   DG Foot Complete Left  Result Date: 04/26/2020 CLINICAL DATA:  Wound plantar region EXAM: LEFT FOOT -  COMPLETE 3+ VIEW COMPARISON:  None. FINDINGS: Frontal, oblique, and lateral views were obtained. There is soft tissue swelling in the dorsal midfoot region. Suggestion of air in the volar soft tissues of the mid and hindfoot. No radiopaque foreign body. No acute fracture or dislocation. There is moderate narrowing of the first MTP joint. Other joint spaces appear unremarkable. There is mild spurring in the dorsal midfoot. No erosive change or bony destruction. There are posterior and inferior calcaneal spurs. IMPRESSION: Suggestion of soft tissue air volar to the hindfoot and midfoot without radiopaque foreign body. Soft tissue swelling dorsal midfoot. No acute fracture or dislocation. No erosive change or bony destruction. Spurring dorsal midfoot. Narrowing first MTP joint. Calcaneal spurs noted. Electronically Signed   By: Lowella Grip III M.D.   On: 04/26/2020 13:15   DG Foot Complete Left  Result Date: 04/23/2020 CLINICAL DATA:  Heel pain after stepping on a nail EXAM: LEFT FOOT - COMPLETE 3+ VIEW COMPARISON:  Prior radiographs of the left foot 01/02/2020 FINDINGS: Soft tissue laceration overlies the left heel. Multiple tiny radiopaque foreign bodies are present within the soft tissues consistent with retained foreign bodies. The largest measures less than 1 mm in size. No evidence of acute fracture or malalignment. Degenerative changes again noted throughout the midfoot. IMPRESSION: 1. Multiple tiny sub-millimeter radiopacities noted in the soft tissues overlying the plantar aspect of the heel in the region of the puncture wound consistent with tiny foreign bodies. 2. No evidence of acute fracture. Electronically Signed   By: Jacqulynn Cadet M.D.   On: 04/23/2020 16:07   VAS [place] ABI WITH/WO TBI  Result Date: 05/14/2020  LOWER EXTREMITY DOPPLER STUDY Patient Name:  COLLIS FLEMING  Date of Exam:   05/11/2020 Medical Rec #: [id number]           Accession #:    [id number] Date of Birth: Sep 10, 1960            Patient Gender: M Patient Age:   59Y Exam Location:  [facility] Procedure:      VAS [place] ABI WITH/WO TBI Referring Phys: [id number] KEVIN P PATEL --------------------------------------------------------------------------------  Indications: Non healing wound left heel, after stepping on nail. High Risk Factors: Diabetes.  Performing Technologist: June Leap RDMS, RVT  Examination Guidelines: A complete evaluation includes at minimum, Doppler waveform signals and systolic blood pressure reading at the level of bilateral brachial, anterior tibial, and posterior tibial arteries, when vessel segments are accessible. Bilateral testing is considered an integral part of a complete examination. Photoelectric Plethysmograph (PPG) waveforms and toe systolic pressure readings are included as required and additional duplex testing as needed. Limited examinations for reoccurring indications may be performed as noted.  ABI Findings: +---------+------------------+-----+---------+--------+ Right    Rt Pressure (mmHg)IndexWaveform Comment  +---------+------------------+-----+---------+--------+ Brachial 160                                      +---------+------------------+-----+---------+--------+ ATA      185               1.16 triphasic         +---------+------------------+-----+---------+--------+ PTA      198               1.24 triphasic         +---------+------------------+-----+---------+--------+ Great Toe135               0.84 Normal            +---------+------------------+-----+---------+--------+ +---------+------------------+-----+---------+-------+ Left     Lt Pressure (mmHg)IndexWaveform Comment +---------+------------------+-----+---------+-------+ ATA      184               1.15 triphasic        +---------+------------------+-----+---------+-------+ PTA      187               1.17 triphasic         +---------+------------------+-----+---------+-------+ Great Toe103               0.64 Abnormal         +---------+------------------+-----+---------+-------+  Summary: Right: Resting right ankle-brachial index is within normal range. No evidence of significant right lower extremity arterial disease. The right toe-brachial index is normal. Left: Resting left ankle-brachial index is within normal range. No evidence of significant left lower extremity arterial disease. The left toe-brachial index is abnormal.  *See table(s) above for measurements and observations.  Electronically signed by Harold Barban MD on 05/14/2020 at 2:57:09 PM.    Final     Labs:  CBC: Recent Labs    04/26/20 1351 04/27/20 0133 04/28/20 0122 05/10/20 0921  WBC 4.8 3.6* 3.5* 4.0  HGB 10.7* 9.9* 9.6* 11.6*  HCT 32.7* 29.9* 28.9* 34.3*  PLT 115* 128* 126* 242    COAGS: Recent Labs    04/26/20 1351  INR 1.2  APTT 22*    BMP: Recent Labs    11/30/19 1122 01/02/20 1129 04/26/20 1351 04/27/20 0133 04/27/20 1457 04/28/20 0122 05/10/20 0921  NA 138   < > 135 134* 134* 136 140  K 3.8   < > 3.1* 3.0* 4.0 3.2* 3.9  CL 98   < > 99 103 102 103 102  CO2 21   < > 24 27 23 29 22   GLUCOSE 296*   < > 267* 212* 186* 185* 129*  BUN 16   < > 19 16 14 11 17   CALCIUM 8.8   < >  8.7* 8.1* 8.1* 8.1* 9.4  CREATININE 1.03   < > 1.17 1.20 1.06 1.01 0.80  GFRNONAA 80   < > >60 >60 >60 >60  --   GFRAA 92  --   --   --   --   --   --    < > = values in this interval not displayed.    LIVER FUNCTION TESTS: Recent Labs    11/30/19 1122 01/02/20 1129 04/26/20 1351  BILITOT 0.4 1.1 1.6*  AST 26 19 28   ALT 29 24 26   ALKPHOS 54 44 53  PROT 6.7 7.3 6.8  ALBUMIN 4.3 3.9 3.3*    TUMOR MARKERS: No results for input(s): AFPTM, CEA, CA199, CHROMGRNA in the last 8760 hours.  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      Non-invasive lower extremity exam and imaging work-up shows evidence of tibial disease.   I had a discussion with him regarding anatomy, pathology/pathophysiology, natural history, and prognosis of PAD/CLI.  Informed consent regarding treatment strategies was performed which would possibly include medical management, surgical strategy, and/or endovascular options, with risk/benefit discussion.  The indications for treatment supported by updated guidelines1, 2 were discussed.  I did let him know that while he might not be at risk for imminent amputation, if there was poor healing and infection became a problem, amputation might be the only option, and that would significantly decrease his quality of life and ability to care for his family.   The indication to do angiogram is based on his symptoms and history of DM, as well as the left sided TBI and PT waveform.   Regarding medical management, maximal medical therapy for reduction of risk factors is indicated as recommended by updated AHA guidelines1.  This includes anti-platelet medication, tight blood glucose control to a HbA1c < 7, tight blood pressure control, maximum-dose HMG-CoA reductase inhibitor, and smoking cessation.     Annual flu vaccination is also recommended, with Class 1 recommendation1.   Patient has elected to proceed with endovascular options.   Regarding endovascular options, specific risks discussed include: bleeding, infection, contrast reaction, renal injury/nephropathy, arterial injury/dissection, need for additional procedure/surgery, worsening symptoms/tissue including limb loss, cardiopulmonary collapse, death.    Plan:  -Plan is to proceed with aorto-peripheral angiogram and possible intervention, with Dr. Earleen Newport at [facility].  Target is the left extremity. - We will plan for [facility] with Angiomax during his procedure based on his heparin allergy history -Recommend maximal medical  therapy for cardiovascular risk reduction.   - We discussed anti-platelet therapy, with plan to perform loading dose and DAPT on the day of procedure. - Continue current care  ___________________________________________________________________   1Morley Kos MD, et al. 2016 AHA/ACC Guideline on the Management of Patients With Lower Extremity Peripheral Artery Disease: Executive Summary: A Report of the American College of Cardiology/American Heart Association Task Force on Clinical Practice Guidelines. J Am Coll Cardiol. 2017 Mar 21;69(11):1465-1508. doi: 10.1016/j.jacc.2016.11.008.   2 - Norgren L, et al. TASC II Working Group. Inter-society consensus for the management of peripheral arterial disease. Int Tressia Miners. 2007 Jun;26(2):81-157. Review. PubMed PMID: [id number]  3 - Hingorani A, et al. The management of diabetic foot: A clinical practice guideline by the Society for Vascular Surgery in collaboration with the [place] and the Society  for Vascular Medicine. J Vasc Surg. 2016 Feb;63(2 Suppl):3S-21S.  doi: 10.1016/j.jvs.2015.10.003. PubMed PMID: 03888280.  4 - Corinna Gab, Saab FA, Luberta Mutter, Grant Ruts, Ewell Poe, Driver VR, Heeia, Lookstein R, van den Baldemar Lenis, Jaff MR, Guadalupe Dawn, Henao S, AlMahameed A, Katzen B. Digital Subtraction Angiography Prior to an Amputation for Critical Limb Ischemia (CLI): An Expert Recommendation Statement From the CLI Global Society to Optimize Limb Salvage. J Endovasc Ther. 2020 Aug;27(4):540-546. doi: 10.1177/1526602820928590. Epub 2020 May 29. PMID: 03491791.    Thank you for this interesting consult.  I greatly enjoyed meeting KEISON GLENDINNING and look forward to participating in their care.  A copy of this report was sent to the requesting provider on this date.  Electronically Signed: Corrie Mckusick 05/21/2020, 10:30 AM   I spent a total of  60 Minutes   in remote  clinical consultation, greater than 50% of which was  counseling/coordinating care for left heel diabetic foot ulcer, possible left angiogram and intervention.    Visit type: Audio only (telephone). Audio (no video) only due to patient's lack of internet/smartphone capability. Alternative for in-person consultation at [facility], [address] [place], [place], [place]. This visit type was conducted due to national recommendations for restrictions regarding the COVID-19 Pandemic (e.g. social distancing).  This format is felt to be most appropriate for this patient at this time.  All issues noted in this document were discussed and addressed.

## 2020-05-22 ENCOUNTER — Encounter: Payer: Self-pay | Admitting: *Deleted

## 2020-05-23 ENCOUNTER — Encounter: Payer: Self-pay | Admitting: Podiatry

## 2020-05-23 NOTE — Progress Notes (Signed)
Subjective:  Patient ID: [name] [name], male    DOB: [birth year],  MRN: [id number]  Chief Complaint  Patient presents with  . Wound Check    Wound care     60 y.o. male presents for wound care.  Patient presents with a follow-up of left heel ulceration that seems to be about the same.  This happened from stepping on a nail.  He states that he has been doing local wound care the wound appears to be about the same has progressively gotten worse.  He is scheduled to see Dr. Earleen Newport for angiogram sometimes this week.  He denies any other acute complaints   Review of Systems: Negative except as noted in the HPI. Denies N/V/F/Ch.  Past Medical History:  Diagnosis Date  . Anxiety   . Arthritis   . Bipolar 1 disorder ([place])   . Blood transfusion without reported diagnosis   . Cataract   . Chronic pain   . Depressed bipolar disorder ([place])   . Depression   . Diabetes mellitus without complication ([place])   . Diabetic neuropathy ([place])   . GERD (gastroesophageal reflux disease)   . Herpes zoster 12/05/2008   Qualifier: Diagnosis of  By: Ronnald Ramp MD, Arvid Right.   . High cholesterol   . 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  Coupled with high doses of loperamide used for pain control.  Unintentional overdose -intractable VT leading to cardiogenic shock - ECMO  . Hyperlipidemia   . Hypertension   . Neuromuscular disorder ([place])    nerve damage back, neck, and shoulder  . Neuropathy in diabetes ([place])   . Sleep apnea    has CPAP but cannot use it  . Substance abuse ([place])    in past     Current Outpatient Medications:  .  amitriptyline (ELAVIL) 50 MG tablet, Take 50 mg by mouth at bedtime., Disp: , Rfl:  .  atorvastatin (LIPITOR) 40 MG tablet, TAKE 1 TABLET BY MOUTH DAILY AT 6 PM. (Patient taking differently: Take 40 mg by mouth daily.), Disp: 30 tablet, Rfl: 5 .  buprenorphine (SUBUTEX) 8 MG SUBL SL tablet, Place 8 mg under  the tongue 3 (three) times daily., Disp: , Rfl:  .  doxycycline (VIBRA-TABS) 100 MG tablet, Take 1 tablet (100 mg total) by mouth 2 (two) times daily., Disp: 20 tablet, Rfl: 1 .  Dulaglutide (TRULICITY) 9.52 WU/1.3KG SOPN, Inject 0.75 mg into the skin once a week., Disp: 2 mL, Rfl: 2 .  fenofibrate (TRICOR) 145 MG tablet, Take 1 tablet (145 mg total) by mouth daily., Disp: 30 tablet, Rfl: 5 .  ferrous sulfate 325 (65 FE) MG EC tablet, Take 1 tablet (325 mg total) by mouth daily with breakfast., Disp: 30 tablet, Rfl: 0 .  hydrochlorothiazide (HYDRODIURIL) 25 MG tablet, TAKE 1 TABLET (25 MG TOTAL) BY MOUTH DAILY. (Patient taking differently: Take 25 mg by mouth daily.), Disp: 30 tablet, Rfl: 5 .  Lancets (FREESTYLE) lancets, Use as instructed, Disp: , Rfl:  .  losartan (COZAAR) 100 MG tablet, Take 1 tablet (100 mg total) by mouth daily., Disp: 30 tablet, Rfl: 5 .  metFORMIN (GLUCOPHAGE-XR) 500 MG 24 hr tablet, TAKE 2 TABLETS (1,000 MG TOTAL) BY MOUTH 2 (TWO) TIMES DAILY. (Patient taking differently: Take 1,000 mg by mouth 2 (two) times daily.), Disp: 120 tablet, Rfl: 5 .  omeprazole (PRILOSEC) 40 MG capsule, Take 40 mg by mouth daily., Disp: , Rfl:  .  ondansetron (ZOFRAN) 4 MG tablet, Take 1 tablet (4 mg total) by mouth every 8 (eight) hours as needed for nausea or vomiting., Disp: 20 tablet, Rfl: 0 .  potassium chloride SA (KLOR-CON) 20 MEQ tablet, Take 2 tablets (40 mEq total) by mouth daily for 5 days., Disp: 10 tablet, Rfl: 0 .  prazosin (MINIPRESS) 2 MG capsule, Take 2 mg by mouth at bedtime., Disp: , Rfl:  .  QUEtiapine (SEROQUEL) 300 MG tablet, Take 150 mg by mouth at bedtime., Disp: , Rfl:  .  sertraline (ZOLOFT) 100 MG tablet, Take 1 tablet by mouth every morning., Disp: , Rfl:  .  Syringe/Needle, Disp, 30G X 1/2" 1 ML MISC, UAD - use as directed, Disp: 100 each, Rfl: 5 .  vitamin B-12 (CYANOCOBALAMIN) 1000 MCG tablet, Take 1 tablet (1,000 mcg total) by mouth daily., Disp: 30 tablet, Rfl:  0  Social History   Tobacco Use  Smoking Status Never Smoker  Smokeless Tobacco Never Used    Allergies  Allergen Reactions  . Heparin Other (See Comments)    HIT Ab negative on 02/20/15, but SRA POSITIVE   . Oxytetracycline Rash and Other (See Comments)  . Del-Mycin [Erythromycin]     All mycin drugs  . Other     Mycins  . Sulfa Antibiotics   . Sulfonamide Derivatives Rash   Objective:   There were no vitals filed for this visit. There is no height or weight on file to calculate BMI. Constitutional Well developed. Well nourished.  Vascular Dorsalis pedis pulses faintly palpable bilaterally. Posterior tibial pulses faintly palpable bilaterally. Capillary refill normal to all digits.  No cyanosis or clubbing noted. Pedal hair growth normal.  Neurologic Normal speech. Oriented to person, place, and time. Protective sensation absent  Dermatologic Wound Location: Left heel ulceration probing down to deep tissue Wound Base: Mixed Granular/Fibrotic Peri-wound: Reddened Exudate: Scant/small amount Serosanguinous exudate Wound Measurements: -See below  Orthopedic: No pain to palpation either foot.   Radiographs: 3 views of skeletally mature the left foot: Some debris noted however no nail noted.  Soft tissue defect noted to the heel.  No signs of osteomyelitis noted.  No cortical destruction noted. Assessment:   1. Diabetic polyneuropathy associated with type 2 diabetes mellitus ([place])   2. Heel ulcer, left, with necrosis of muscle ([place])   3. Chronic osteomyelitis of left foot with draining sinus ([facility])    Plan:  Patient was evaluated and treated and all questions answered.  Ulcer left heel ulceration probing down to deep tissues/capsule with underlying concern for osteomyelitis -Debridement as below. -Dressed with Betadine wet-to-dry, DSD. -Continue off-loading with cam boot -Doxycycline was dispensed for skin and soft tissue prophylaxis I plan on keeping him on  doxycycline until resolvent of the wound. -Given the location of the wound patient is a high risk of major amputation if the wound further regresses.  Given his A1c of 7.1 and the difficulty of healing the wound I discussed with the patient that he is at high risk of amputation.  He states understanding. -ABIs were reviewed with the patient.  He is scheduled to follow-up with Dr. Earleen Newport and is scheduled for angiogram.  I will await for his input. -MRI of the left heel is ordered for concern for osteomyelitis given the depth of the wound without resolve  Procedure: Excisional Debridement of Wound~stagnant Tool: Sharp chisel blade/tissue nipper Rationale: Removal of non-viable soft tissue from the wound to promote healing.  Anesthesia: none Pre-Debridement Wound Measurements: 3 cm  x 2.5 cm x 0.7 cm  Post-Debridement Wound Measurements: 3.2 cm x 2.7 cm x 0.7 cm  Type of Debridement: Sharp Excisional Tissue Removed: Non-viable soft tissue Blood loss: Minimal (<50cc) Depth of Debridement: Deep tissue/capsule Technique: Sharp excisional debridement to bleeding, viable wound base.  Wound Progress: The wound appears about the same as previous measurement. Dressing: Dry, sterile, compression dressing. Disposition: Patient tolerated procedure well. Patient to return in 1 week for follow-up.  No follow-ups on file.

## 2020-05-29 ENCOUNTER — Ambulatory Visit: Payer: Medicare Other | Admitting: Podiatry

## 2020-05-29 ENCOUNTER — Other Ambulatory Visit: Payer: Self-pay

## 2020-05-29 DIAGNOSIS — L97423 Non-pressure chronic ulcer of left heel and midfoot with necrosis of muscle: Secondary | ICD-10-CM

## 2020-05-29 DIAGNOSIS — E1142 Type 2 diabetes mellitus with diabetic polyneuropathy: Secondary | ICD-10-CM | POA: Diagnosis not present

## 2020-05-29 MED ORDER — DOXYCYCLINE HYCLATE 100 MG PO TABS: 100.0000 mg | ORAL_TABLET | Freq: Two times a day (BID) | ORAL | 0 refills | Status: DC

## 2020-05-29 MED ORDER — DOXYCYCLINE HYCLATE 100 MG PO TABS: 100.0000 mg | ORAL_TABLET | Freq: Two times a day (BID) | ORAL | 1 refills | Status: DC

## 2020-05-30 ENCOUNTER — Encounter: Payer: Self-pay | Admitting: Podiatry

## 2020-05-30 ENCOUNTER — Other Ambulatory Visit (HOSPITAL_COMMUNITY): Payer: Self-pay | Admitting: Interventional Radiology

## 2020-05-30 DIAGNOSIS — L97424 Non-pressure chronic ulcer of left heel and midfoot with necrosis of bone: Secondary | ICD-10-CM

## 2020-05-30 NOTE — Addendum Note (Signed)
Addended by: Boneta Lucks on: 05/30/2020 12:22 PM   Modules accepted: Orders

## 2020-05-30 NOTE — Progress Notes (Signed)
Subjective:  Patient ID: [name] [name], male    DOB: 12/04/1960,  MRN: [id number]  Chief Complaint  Patient presents with  . Wound Check    PT stated that he is doing welll he has no concerns     60 y.o. male presents for wound care.  Patient presents with a follow-up of left heel ulceration that seems to be about the same.  This happened from stepping on a nail.  He states that he has been doing local wound care the wound appears to be about the same has progressively gotten worse.  He states that he never got a phone call from Dr. Earleen Newport for angiogram.   Review of Systems: Negative except as noted in the HPI. Denies N/V/F/Ch.  Past Medical History:  Diagnosis Date  . Anxiety   . Arthritis   . Bipolar 1 disorder ([place])   . Blood transfusion without reported diagnosis   . Cataract   . Chronic pain   . Depressed bipolar disorder ([place])   . Depression   . Diabetes mellitus without complication ([place])   . Diabetic neuropathy ([place])   . GERD (gastroesophageal reflux disease)   . Herpes zoster 12/05/2008   Qualifier: Diagnosis of  By: Ronnald Ramp MD, Arvid Right.   . High cholesterol   . 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  Coupled with high doses of loperamide used for pain control.  Unintentional overdose -intractable VT leading to cardiogenic shock - ECMO  . Hyperlipidemia   . Hypertension   . Neuromuscular disorder ([place])    nerve damage back, neck, and shoulder  . Neuropathy in diabetes ([place])   . Sleep apnea    has CPAP but cannot use it  . Substance abuse ([place])    in past     Current Outpatient Medications:  .  doxycycline (VIBRA-TABS) 100 MG tablet, Take 1 tablet (100 mg total) by mouth 2 (two) times daily., Disp: 60 tablet, Rfl: 0 .  amitriptyline (ELAVIL) 50 MG tablet, Take 50 mg by mouth at bedtime., Disp: , Rfl:  .  atorvastatin (LIPITOR) 40 MG tablet, TAKE 1 TABLET BY MOUTH DAILY AT 6 PM. (Patient  taking differently: Take 40 mg by mouth daily.), Disp: 30 tablet, Rfl: 5 .  buprenorphine (SUBUTEX) 8 MG SUBL SL tablet, Place 8 mg under the tongue 3 (three) times daily., Disp: , Rfl:  .  doxycycline (VIBRA-TABS) 100 MG tablet, Take 1 tablet (100 mg total) by mouth 2 (two) times daily., Disp: 20 tablet, Rfl: 1 .  Dulaglutide (TRULICITY) 7.34 LP/3.7TK SOPN, Inject 0.75 mg into the skin once a week., Disp: 2 mL, Rfl: 2 .  fenofibrate (TRICOR) 145 MG tablet, Take 1 tablet (145 mg total) by mouth daily., Disp: 30 tablet, Rfl: 5 .  ferrous sulfate 325 (65 FE) MG EC tablet, Take 1 tablet (325 mg total) by mouth daily with breakfast., Disp: 30 tablet, Rfl: 0 .  hydrochlorothiazide (HYDRODIURIL) 25 MG tablet, TAKE 1 TABLET (25 MG TOTAL) BY MOUTH DAILY. (Patient taking differently: Take 25 mg by mouth daily.), Disp: 30 tablet, Rfl: 5 .  Lancets (FREESTYLE) lancets, Use as instructed, Disp: , Rfl:  .  losartan (COZAAR) 100 MG tablet, Take 1 tablet (100 mg total) by mouth daily., Disp: 30 tablet, Rfl: 5 .  metFORMIN (GLUCOPHAGE-XR) 500 MG 24 hr tablet, TAKE 2 TABLETS (1,000 MG TOTAL) BY MOUTH 2 (TWO) TIMES DAILY. (Patient taking differently: Take 1,000 mg  by mouth 2 (two) times daily.), Disp: 120 tablet, Rfl: 5 .  omeprazole (PRILOSEC) 40 MG capsule, Take 40 mg by mouth daily., Disp: , Rfl:  .  ondansetron (ZOFRAN) 4 MG tablet, Take 1 tablet (4 mg total) by mouth every 8 (eight) hours as needed for nausea or vomiting., Disp: 20 tablet, Rfl: 0 .  potassium chloride SA (KLOR-CON) 20 MEQ tablet, Take 2 tablets (40 mEq total) by mouth daily for 5 days., Disp: 10 tablet, Rfl: 0 .  prazosin (MINIPRESS) 2 MG capsule, Take 2 mg by mouth at bedtime., Disp: , Rfl:  .  QUEtiapine (SEROQUEL) 300 MG tablet, Take 150 mg by mouth at bedtime., Disp: , Rfl:  .  sertraline (ZOLOFT) 100 MG tablet, Take 1 tablet by mouth every morning., Disp: , Rfl:  .  Syringe/Needle, Disp, 30G X 1/2" 1 ML MISC, UAD - use as directed, Disp: 100  each, Rfl: 5 .  vitamin B-12 (CYANOCOBALAMIN) 1000 MCG tablet, Take 1 tablet (1,000 mcg total) by mouth daily., Disp: 30 tablet, Rfl: 0  Social History   Tobacco Use  Smoking Status Never Smoker  Smokeless Tobacco Never Used    Allergies  Allergen Reactions  . Heparin Other (See Comments)    HIT Ab negative on 02/20/15, but SRA POSITIVE   . Oxytetracycline Rash and Other (See Comments)  . Del-Mycin [Erythromycin]     All mycin drugs  . Other     Mycins  . Sulfa Antibiotics   . Sulfonamide Derivatives Rash   Objective:   There were no vitals filed for this visit. There is no height or weight on file to calculate BMI. Constitutional Well developed. Well nourished.  Vascular Dorsalis pedis pulses faintly palpable bilaterally. Posterior tibial pulses faintly palpable bilaterally. Capillary refill normal to all digits.  No cyanosis or clubbing noted. Pedal hair growth normal.  Neurologic Normal speech. Oriented to person, place, and time. Protective sensation absent  Dermatologic Wound Location: Left heel ulceration probing down to deep tissue Wound Base: Mixed Granular/Fibrotic Peri-wound: Reddened Exudate: Scant/small amount Serosanguinous exudate Wound Measurements: -See below  Orthopedic: No pain to palpation either foot.   Radiographs: 3 views of skeletally mature the left foot: Some debris noted however no nail noted.  Soft tissue defect noted to the heel.  No signs of osteomyelitis noted.  No cortical destruction noted. Assessment:   1. Heel ulcer, left, with necrosis of muscle ([place])   2. Diabetic polyneuropathy associated with type 2 diabetes mellitus ([place])    Plan:  Patient was evaluated and treated and all questions answered.  Ulcer left heel ulceration probing down to deep tissues/capsule with underlying concern for osteomyelitis -Debridement as below. -Dressed with Betadine wet-to-dry, DSD. -Continue off-loading with cam boot -Doxycycline was dispensed  for skin and soft tissue prophylaxis I plan on keeping him on doxycycline until resolvent of the wound. -Given the location of the wound patient is a high risk of major amputation if the wound further regresses.  Given his A1c of 7.1 and the difficulty of healing the wound I discussed with the patient that he is at high risk of amputation.  He states understanding. -ABIs were reviewed with the patient.  He is scheduled to follow-up with Dr. Earleen Newport and is scheduled for angiogram.  I will await for his input. -MRI of the left heel is ordered for concern for osteomyelitis given the depth of the wound without resolve.  I will attempt to reschedule the MRI.  Procedure: Excisional Debridement of  Wound~stagnant Tool: Sharp chisel blade/tissue nipper Rationale: Removal of non-viable soft tissue from the wound to promote healing.  Anesthesia: none Pre-Debridement Wound Measurements: 3 cm x 2.5 cm x 0.7 cm  Post-Debridement Wound Measurements: 3.2 cm x 2.7 cm x 0.7 cm  Type of Debridement: Sharp Excisional Tissue Removed: Non-viable soft tissue Blood loss: Minimal (<50cc) Depth of Debridement: Deep tissue/capsule Technique: Sharp excisional debridement to bleeding, viable wound base.  Wound Progress: The wound appears about the same as previous measurement. Dressing: Dry, sterile, compression dressing. Disposition: Patient tolerated procedure well. Patient to return in 1 week for follow-up.  No follow-ups on file.

## 2020-06-04 ENCOUNTER — Other Ambulatory Visit: Payer: Self-pay | Admitting: Radiology

## 2020-06-05 ENCOUNTER — Other Ambulatory Visit: Payer: Self-pay

## 2020-06-05 ENCOUNTER — Other Ambulatory Visit (HOSPITAL_COMMUNITY): Payer: Self-pay | Admitting: Interventional Radiology

## 2020-06-05 ENCOUNTER — Encounter (HOSPITAL_COMMUNITY): Payer: Self-pay

## 2020-06-05 ENCOUNTER — Ambulatory Visit (HOSPITAL_COMMUNITY)
Admission: RE | Admit: 2020-06-05 | Discharge: 2020-06-05 | Disposition: A | Discharge status: Home / Self Care | Payer: Medicare Other | Source: Ambulatory Visit | Attending: Interventional Radiology | Admitting: Interventional Radiology

## 2020-06-05 DIAGNOSIS — Z881 Allergy status to other antibiotic agents status: Secondary | ICD-10-CM | POA: Diagnosis not present

## 2020-06-05 DIAGNOSIS — Z833 Family history of diabetes mellitus: Secondary | ICD-10-CM | POA: Insufficient documentation

## 2020-06-05 DIAGNOSIS — L97424 Non-pressure chronic ulcer of left heel and midfoot with necrosis of bone: Secondary | ICD-10-CM

## 2020-06-05 DIAGNOSIS — Z882 Allergy status to sulfonamides status: Secondary | ICD-10-CM | POA: Insufficient documentation

## 2020-06-05 DIAGNOSIS — Z8249 Family history of ischemic heart disease and other diseases of the circulatory system: Secondary | ICD-10-CM | POA: Diagnosis not present

## 2020-06-05 DIAGNOSIS — L97429 Non-pressure chronic ulcer of left heel and midfoot with unspecified severity: Secondary | ICD-10-CM | POA: Diagnosis not present

## 2020-06-05 DIAGNOSIS — E11621 Type 2 diabetes mellitus with foot ulcer: Secondary | ICD-10-CM | POA: Diagnosis not present

## 2020-06-05 DIAGNOSIS — Z79899 Other long term (current) drug therapy: Secondary | ICD-10-CM | POA: Insufficient documentation

## 2020-06-05 DIAGNOSIS — E1140 Type 2 diabetes mellitus with diabetic neuropathy, unspecified: Secondary | ICD-10-CM | POA: Diagnosis not present

## 2020-06-05 DIAGNOSIS — I70244 Atherosclerosis of native arteries of left leg with ulceration of heel and midfoot: Secondary | ICD-10-CM | POA: Insufficient documentation

## 2020-06-05 DIAGNOSIS — E785 Hyperlipidemia, unspecified: Secondary | ICD-10-CM | POA: Diagnosis not present

## 2020-06-05 DIAGNOSIS — Z7984 Long term (current) use of oral hypoglycemic drugs: Secondary | ICD-10-CM | POA: Insufficient documentation

## 2020-06-05 DIAGNOSIS — I10 Essential (primary) hypertension: Secondary | ICD-10-CM | POA: Insufficient documentation

## 2020-06-05 DIAGNOSIS — Z888 Allergy status to other drugs, medicaments and biological substances status: Secondary | ICD-10-CM | POA: Insufficient documentation

## 2020-06-05 DIAGNOSIS — S91302A Unspecified open wound, left foot, initial encounter: Secondary | ICD-10-CM | POA: Diagnosis not present

## 2020-06-05 HISTORY — PX: IR ANGIOGRAM EXTREMITY LEFT: IMG651

## 2020-06-05 HISTORY — PX: IR PTA NON CORO-LOWER EXTREM: IMG6142

## 2020-06-05 HISTORY — PX: IR US GUIDE VASC ACCESS LEFT: IMG2389

## 2020-06-05 LAB — CBC
HCT: 37.2 % — ABNORMAL LOW (ref 39.0–52.0)
Hemoglobin: 12.3 g/dL — ABNORMAL LOW (ref 13.0–17.0)
MCH: 26.2 pg (ref 26.0–34.0)
MCHC: 33.1 g/dL (ref 30.0–36.0)
MCV: 79.1 fL — ABNORMAL LOW (ref 80.0–100.0)
Platelets: 168 10*3/uL (ref 150–400)
RBC: 4.7 MIL/uL (ref 4.22–5.81)
RDW: 14.3 % (ref 11.5–15.5)
WBC: 4.8 10*3/uL (ref 4.0–10.5)
nRBC: 0 % (ref 0.0–0.2)

## 2020-06-05 LAB — PROTIME-INR
INR: 1.1 (ref 0.8–1.2)
Prothrombin Time: 14 seconds (ref 11.4–15.2)

## 2020-06-05 LAB — GLUCOSE, CAPILLARY: Glucose-Capillary: 155 mg/dL — ABNORMAL HIGH (ref 70–99)

## 2020-06-05 MED ORDER — LIDOCAINE HCL (PF) 1 % IJ SOLN
INTRAMUSCULAR | Status: AC | PRN
  Administered 2020-06-05: 10 mL

## 2020-06-05 MED ORDER — MIDAZOLAM HCL 2 MG/2ML IJ SOLN
INTRAMUSCULAR | Status: AC
  Filled 2020-06-05: qty 4

## 2020-06-05 MED ORDER — LIDOCAINE HCL (PF) 1 % IJ SOLN
INTRAMUSCULAR | Status: AC
  Filled 2020-06-05: qty 30

## 2020-06-05 MED ORDER — SODIUM CHLORIDE 0.9 % IV SOLN
1.7500 mg/kg/h | Freq: Once | INTRAVENOUS | Status: AC
  Administered 2020-06-05: 1.75 mg/kg/h via INTRAVENOUS
  Filled 2020-06-05: qty 250

## 2020-06-05 MED ORDER — ASPIRIN 325 MG PO TABS
650.0000 mg | ORAL_TABLET | Freq: Once | ORAL | Status: AC
  Administered 2020-06-05: 650 mg via ORAL
  Filled 2020-06-05: qty 2

## 2020-06-05 MED ORDER — CLOPIDOGREL BISULFATE 75 MG PO TABS
300.0000 mg | ORAL_TABLET | Freq: Once | ORAL | Status: AC
  Administered 2020-06-05: 300 mg via ORAL
  Filled 2020-06-05: qty 4

## 2020-06-05 MED ORDER — FENTANYL CITRATE (PF) 100 MCG/2ML IJ SOLN
INTRAMUSCULAR | Status: AC | PRN
  Administered 2020-06-05: 25 ug via INTRAVENOUS
  Administered 2020-06-05 (×3): 50 ug via INTRAVENOUS
  Administered 2020-06-05: 25 ug via INTRAVENOUS

## 2020-06-05 MED ORDER — SODIUM CHLORIDE 0.9 % IV SOLN: 1.7500 mg/kg/h | INTRAVENOUS | Status: DC

## 2020-06-05 MED ORDER — MIDAZOLAM HCL 2 MG/2ML IJ SOLN
INTRAMUSCULAR | Status: AC | PRN
  Administered 2020-06-05 (×4): 1 mg via INTRAVENOUS

## 2020-06-05 MED ORDER — BIVALIRUDIN BOLUS VIA INFUSION
0.7500 mg/kg | Freq: Once | INTRAVENOUS | Status: AC
  Administered 2020-06-05: 81.7 mg via INTRAVENOUS
  Filled 2020-06-05: qty 82

## 2020-06-05 MED ORDER — IOHEXOL 300 MG/ML  SOLN: 100.0000 mL | Freq: Once | INTRAMUSCULAR | Status: DC | PRN

## 2020-06-05 MED ORDER — FENTANYL CITRATE (PF) 100 MCG/2ML IJ SOLN
INTRAMUSCULAR | Status: AC
  Filled 2020-06-05: qty 4

## 2020-06-05 MED ORDER — SODIUM CHLORIDE 0.9 % IV SOLN
INTRAVENOUS | Status: DC
Start: 2020-06-05 — End: 2020-06-06

## 2020-06-05 MED ORDER — SODIUM CHLORIDE 0.9 % IV SOLN
1.7500 mg/kg/h | INTRAVENOUS | Status: AC
  Administered 2020-06-05: 1.75 mg/kg/h via INTRAVENOUS
  Filled 2020-06-05 (×3): qty 250

## 2020-06-05 NOTE — Progress Notes (Signed)
Transported patient to Short Stay P05, bedside handoff completed with Kyra Manges) RN at bedside. Left groin site is intact, no hematoma. Left foot wound is clean, dry, and intact. Care turned over at this time.

## 2020-06-05 NOTE — Progress Notes (Signed)
Interventional Radiology Progress Note   Exoseal closure performed of the left CFA access in VIR.  10 minutes manual compression.  No hematoma.  Ecchymosis at the site, expected.   Manual pressure performed of the left AT access site in VIR. 10 minutes manual compression  Tegaderm dressings applied.   DC home when goals met in 3 hours Routine dressing to the left foot before DC home.   Signed,  Dulcy Fanny. Earleen Newport, DO

## 2020-06-05 NOTE — Discharge Instructions (Addendum)
Angiogram, Care After Hold metformin // restart Friday  This sheet gives you information about how to care for yourself after your procedure. Your doctor may also give you more specific instructions. If you have problems or questions, contact your doctor. What can I expect after the procedure? After the procedure, it is common to have these problems at the point where the catheter was inserted:  Bruising.  Tenderness.  A collection of blood (hematoma). This may feel like a small lump under the skin at the insertion site. Follow these instructions at home: Insertion site care  Follow instructions from your doctor about how to take care of the area where the catheter was inserted. Make sure you: ? Wash your hands with soap and water before you change your bandage (dressing). If you cannot use soap and water, use hand sanitizer. ? Change your bandage as told by your doctor.  Do not take baths, swim, or use a hot tub until your doctor says it is okay.  You may shower 24-48 hours after the procedure, or as told by your doctor. To clean the area: ? Gently wash the area with plain soap and water. ? Pat the area dry with a clean towel. ? Do not rub the area. This may cause bleeding.  Check your insertion area every day for signs of infection. Check for: ? Redness, swelling, or pain. ? Fluid or blood. ? Warmth. ? Pus or a bad smell.  Do not apply powder or lotion to the area. Keep the area clean and dry.   Activity  Do not drive for 24 hours if you were given a medicine to help you relax (sedative).  Rest as told by your doctor, usually for 1-2 days.  Do not lift anything that is heavier than 10 lbs. (4.5 kg) or as told by your doctor.  If your insertion site was in your leg, try to avoid stairs for a few days.  Return to your normal activities as told by your doctor. Ask your doctor what activities are safe for you. General instructions  If the insertion area starts to bleed, lie  flat and put pressure on the area. If the bleeding does not stop, get help right away. This is an emergency.  Take over-the-counter and prescription medicines only as told by your doctor.  Drink enough fluid to keep your pee (urine) pale yellow.  Keep all follow-up visits as told by your doctor. This is important.   Contact a doctor if:  You have a fever.  You have chills.  You have redness, swelling, or pain around your insertion area.  You have fluid or blood coming from your insertion area.  The insertion area feels warm to the touch.  You have pus or a bad smell coming from your insertion area.  You have more bruising around the insertion area. Get help right away if:  You have a lot of pain in the insertion area.  The insertion area swells very fast.  The insertion area is bleeding, and the bleeding does not stop after you hold steady pressure on the area.  The area around the insertion area becomes pale, cool, tingly, or numb.  You have chest pain.  You have trouble breathing.  You have a rash.  You have any signs of a stroke. "BE FAST" is an easy way to remember the main warning signs: ? B - Balance. Signs are dizziness, sudden trouble walking, or loss of balance. ? E - Eyes. Signs  are trouble seeing or a change in how you see. ? F - Face. Signs are sudden weakness or loss of feeling of the face, or the face or eyelid drooping on one side. ? A - Arms. Signs are weakness or loss of feeling in an arm. This happens suddenly and usually on one side of the body. ? S - Speech. Signs are sudden trouble speaking, slurred speech, or trouble understanding what people say. ? T - Time. Time to call emergency services. Write down what time symptoms started.  You have other signs of a stroke, such as: ? A sudden, very bad headache with no known cause. ? Feeling like you may vomit (nausea). ? Vomiting. ? A seizure. These symptoms may be an emergency. Do not wait to see if  the symptoms will go away. Get medical help right away. Call your local emergency services (911 in the [place] Do not drive yourself to the hospital. Summary  After the procedure, it is common to have bruising and tenderness at the insertion area.  Do not take baths, swim, or use a hot tub until your doctor says it is okay to do so. You may shower 24-48 hours after the procedure or as told by your doctor.  It is important to rest and drink plenty of fluids.  If the insertion area starts to bleed, lie flat and put pressure on the area. If the bleeding does not stop, get help right away. This is an emergency. This information is not intended to replace advice given to you by your health care provider. Make sure you discuss any questions you have with your health care provider. Document Revised: 11/03/2018 Document Reviewed: 11/03/2018 Elsevier Patient Education  2021 [employer]. Moderate Conscious Sedation, Adult Sedation is the use of medicines to promote relaxation and to relieve discomfort and anxiety. Moderate conscious sedation is a type of sedation. Under moderate conscious sedation, you are less alert than normal, but you are still able to respond to instructions, touch, or both. Moderate conscious sedation is used during short medical and dental procedures. It is milder than deep sedation, which is a type of sedation under which you cannot be easily woken up. It is also milder than general anesthesia, which is the use of medicines to make you unconscious. Moderate conscious sedation allows you to return to your regular activities sooner. Tell a health care provider about:  Any allergies you have.  All medicines you are taking, including vitamins, herbs, eye drops, creams, and over-the-counter medicines.  Any use of steroids. This includes steroids taken by mouth or as a cream.  Any problems you or family members have had with sedatives and anesthetic medicines.  Any blood disorders  you have.  Any surgeries you have had.  Any medical conditions you have, such as sleep apnea.  Whether you are pregnant or may be pregnant.  Any use of cigarettes, alcohol, marijuana, or drugs. What are the risks? Generally, this is a safe procedure. However, problems may occur, including:  Getting too much medicine (oversedation).  Nausea.  Allergic reaction to medicines.  Trouble breathing. If this happens, a breathing tube may be used. It will be removed when you are awake and breathing on your own.  Heart trouble.  Lung trouble.  Confusion that gets better with time (emergence delirium). What happens before the procedure? Staying hydrated Follow instructions from your health care provider about hydration, which may include:  Up to 2 hours before the procedure - you may  continue to drink clear liquids, such as water, clear fruit juice, black coffee, and plain tea. Eating and drinking restrictions Follow instructions from your health care provider about eating and drinking, which may include:  8 hours before the procedure - stop eating heavy meals or foods, such as meat, fried foods, or fatty foods.  6 hours before the procedure - stop eating light meals or foods, such as toast or cereal.  6 hours before the procedure - stop drinking milk or drinks that contain milk.  2 hours before the procedure - stop drinking clear liquids. Medicines Ask your health care provider about:  Changing or stopping your regular medicines. This is especially important if you are taking diabetes medicines or blood thinners.  Taking medicines such as aspirin and ibuprofen. These medicines can thin your blood. Do not take these medicines unless your health care provider tells you to take them.  Taking over-the-counter medicines, vitamins, herbs, and supplements. Tests and exams  You will have a physical exam.  You may have blood tests done to show how well: ? Your kidneys and liver  work. ? Your blood clots. General instructions  Plan to have a responsible adult take you home from the hospital or clinic.  If you will be going home right after the procedure, plan to have a responsible adult care for you for the time you are told. This is important. What happens during the procedure?  You will be given the sedative. The sedative may be given: ? As a pill that you will swallow. It can also be inserted into the rectum. ? As a spray through the nose. ? As an injection into the muscle. ? As an injection into the vein through an IV.  You may be given oxygen as needed.  Your breathing, heart rate, and blood pressure will be monitored during the procedure.  The medical or dental procedure will be done. The procedure may vary among health care providers and hospitals.   What happens after the procedure?  Your blood pressure, heart rate, breathing rate, and blood oxygen level will be monitored until you leave the hospital or clinic.  You will get fluids through your IV if needed.  Do not drive or operate machinery until your health care provider says that it is safe. Summary  Sedation is the use of medicines to promote relaxation and to relieve discomfort and anxiety. Moderate conscious sedation is a type of sedation that is used during short medical and dental procedures.  Tell the health care provider about any medical conditions that you have and about all the medicines that you are taking.  You will be given the sedative as a pill, a spray through the nose, an injection into the muscle, or an injection into the vein through an IV. Vital signs are monitored during the sedation.  Moderate conscious sedation allows you to return to your regular activities sooner. This information is not intended to replace advice given to you by your health care provider. Make sure you discuss any questions you have with your health care provider. Document Revised: 04/29/2019  Document Reviewed: 11/25/2018 Elsevier Patient Education  2021 [employer].

## 2020-06-05 NOTE — H&P (Addendum)
Chief Complaint: Patient was seen in consultation today for left lower extremity Aortaperipheral arteriogram with possible intervention  at the request of Dr Keith Rake   Supervising Physician: Corrie Mckusick  Patient Status: [facility] - Out-pt  History of Present Illness: DAVIUS [name] is a 60 y.o. male   DM; Bipolar; GERD; HLD; HTN Peripheral arterial disease; chronic limb ischemia Pt was seen in consultation with Dr Earleen Newport 05/21/20 After stepping on  nail with left heel--- has had little /slow healing per Dr Posey Pronto- Podiatry  Non-invasive exam 05/11/20 Right: ABI: 1.24 Left: ABI: 1.17, TBI: 0.64 Segmental shows some artifact of the waveforms on the left, which may be secondary to hyperemia.  In particular the PT distribution, which is the region of interest given the heel wound.   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     Non-invasive lower extremity exam and imaging work-up shows evidence of tibial disease.   I had a discussion with him regarding anatomy, pathology/pathophysiology, natural history, and prognosis of PAD/CLI.  Informed consent regarding treatment strategies was performed which would possibly include medical management, surgical strategy, and/or endovascular options, with risk/benefit discussion.  The indications for treatment supported by updated guidelines1, 2 were discussed. I did let him know that while he might not be at risk for imminent amputation, if there was poor healing and infection became a problem, amputation might be the only option, and that would significantly decrease his quality of life and ability to care for his family.   The indication to do angiogram is based on his symptoms and history of DM, as well as the left sided TBI and PT waveform.   Plan:  -Plan is to proceed with aorto-peripheral angiogram and possible  intervention, with Dr. Earleen Newport at [facility].  Target is the left extremity. - We will plan for [facility] with Angiomax during his procedure based on his heparin allergy history -Recommend maximal medical therapy for cardiovascular risk reduction.   - We discussed anti-platelet therapy, with plan to perform loading dose and DAPT on the day of procedure.  Past Medical History:  Diagnosis Date  . Anxiety   . Arthritis   . Bipolar 1 disorder ([place])   . Blood transfusion without reported diagnosis   . Cataract   . Chronic pain   . Depressed bipolar disorder ([place])   . Depression   . Diabetes mellitus without complication ([place])   . Diabetic neuropathy ([place])   . GERD (gastroesophageal reflux disease)   . Herpes zoster 12/05/2008   Qualifier: Diagnosis of  By: Ronnald Ramp MD, Arvid Right.   . High cholesterol   . 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  Coupled with high doses of loperamide used for pain control.  Unintentional overdose -intractable VT leading to cardiogenic shock - ECMO  . Hyperlipidemia   . Hypertension   . Neuromuscular disorder ([place])    nerve damage back, neck, and shoulder  . Neuropathy in diabetes ([place])   . Sleep apnea    has CPAP but cannot use it  . Substance abuse ([place])    in past     Past Surgical History:  Procedure Laterality Date  . COLONOSCOPY    . 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  INTRAOPERATIVE TRANSESOPHAGEAL ECHOCARDIOGRAM N/A 11/26/2016   Procedure: INTRAOPERATIVE TRANSESOPHAGEAL ECHOCARDIOGRAM;  Surgeon: Ivin Poot, MD;  Location: [facility];  Service: Open Heart Surgery;  Laterality: N/A;  . IR RADIOLOGIST EVAL & MGMT  05/21/2020  . POLYPECTOMY    . SHOULDER ARTHROSCOPY WITH ROTATOR CUFF REPAIR Right 2009  . [place]  2010  . TRANSTHORACIC ECHOCARDIOGRAM  07/2016; 12/10/2016   a. Prior to VT  arrest: normal. EF 55-60%. Gr 1 DD.  mild LVH. Mildly dilated Aortic Root.;; b.  Normal LV size and function.  Mild LVH.  EF 55%.  No RWMA.  No valve abnormalities.    Allergies: Heparin, Oxytetracycline, Del-mycin [erythromycin], Other, Sulfa antibiotics, and Sulfonamide derivatives  Medications: Prior to Admission medications   Medication Sig Start Date End Date Taking? Authorizing Provider  atorvastatin (LIPITOR) 40 MG tablet TAKE 1 TABLET BY MOUTH DAILY AT 6 PM. Patient taking differently: Take 40 mg by mouth daily. 11/09/19  Yes McClung, Dionne Bucy, PA-C  buprenorphine (SUBUTEX) 8 MG SUBL SL tablet Place 8 mg under the tongue every 8 (eight) hours.   Yes [provider]  doxycycline (VIBRA-TABS) 100 MG tablet Take 1 tablet (100 mg total) by mouth 2 (two) times daily. 05/29/20  Yes Felipa Furnace, DPM  Dulaglutide (TRULICITY) 6.19 JK/9.3OI SOPN Inject 0.75 mg into the skin once a week. Patient taking differently: Inject 0.75 mg into the skin every Wednesday. 11/09/19  Yes McClung, Angela M, PA-C  fenofibrate (TRICOR) 145 MG tablet Take 1 tablet (145 mg total) by mouth daily. 11/09/19  Yes Argentina Donovan, PA-C  ferrous sulfate 325 (65 FE) MG EC tablet Take 1 tablet (325 mg total) by mouth daily with breakfast. 04/28/20 05/28/20 Yes Florencia Reasons, MD  hydrochlorothiazide (HYDRODIURIL) 25 MG tablet TAKE 1 TABLET (25 MG TOTAL) BY MOUTH DAILY. Patient taking differently: Take 25 mg by mouth daily. 11/09/19 11/08/20 Yes McClung, Dionne Bucy, PA-C  losartan (COZAAR) 100 MG tablet Take 1 tablet (100 mg total) by mouth daily. 11/09/19  Yes McClung, Angela M, PA-C  metFORMIN (GLUCOPHAGE-XR) 500 MG 24 hr tablet TAKE 2 TABLETS (1,000 MG TOTAL) BY MOUTH 2 (TWO) TIMES DAILY. Patient taking differently: Take 1,000 mg by mouth 2 (two) times daily. 02/13/20 02/12/21 Yes Nicolette Bang, DO  omeprazole (PRILOSEC) 40 MG capsule Take 40 mg by mouth at bedtime.   Yes [provider]  prazosin  (MINIPRESS) 2 MG capsule Take 2 mg by mouth at bedtime.   Yes [provider]  QUEtiapine (SEROQUEL) 300 MG tablet Take 150 mg by mouth at bedtime.   Yes [provider]  sertraline (ZOLOFT) 100 MG tablet Take 100 mg by mouth daily. 04/09/20  Yes [provider]  vitamin B-12 (CYANOCOBALAMIN) 1000 MCG tablet Take 1 tablet (1,000 mcg total) by mouth daily. 04/28/20  Yes Florencia Reasons, MD  Lancets (FREESTYLE) lancets Use as instructed 05/24/14   [provider]  ondansetron (ZOFRAN) 4 MG tablet Take 1 tablet (4 mg total) by mouth every 8 (eight) hours as needed for nausea or vomiting. Patient not taking: Reported on 05/31/2020 04/24/20   Felipa Furnace, DPM  potassium chloride SA (KLOR-CON) 20 MEQ tablet Take 2 tablets (40 mEq total) by mouth daily for 5 days. 04/28/20 05/03/20  Florencia Reasons, MD  Syringe/Needle, Disp, 30G X 1/2" 1 ML MISC UAD - use as directed 12/29/18   Ladell Pier, MD     Family History  Problem Relation Age of Onset  .  Diabetes Mother   . Hyperlipidemia Mother   . Heart disease Father   . Colon cancer Neg Hx   . Colon polyps Neg Hx   . Esophageal cancer Neg Hx   . Rectal cancer Neg Hx   . Stomach cancer Neg Hx     Social History   Socioeconomic History  . Marital status: Divorced    Spouse name: Not on file  . Number of children: 3  . Years of education: Not on file  . Highest education level: Not on file  Occupational History  . Occupation: Disabled  Tobacco Use  . Smoking status: Never Smoker  . Smokeless tobacco: Never Used  Vaping Use  . Vaping Use: Never used  Substance and Sexual Activity  . Alcohol use: Not Currently  . Drug use: No  . Sexual activity: Not Currently  Other Topics Concern  . Not on file  Social History Narrative   ** Merged History Encounter **       Social Determinants of Health   Financial Resource Strain: Not on file  Food Insecurity: Not on file  Transportation Needs: Not on file  Physical  Activity: Not on file  Stress: Not on file  Social Connections: Not on file    Review of Systems: A 12 point ROS discussed and pertinent positives are indicated in the HPI above.  All other systems are negative.  Review of Systems  Constitutional: Negative for fever.  Respiratory: Negative for cough and shortness of breath.   Cardiovascular: Negative for chest pain.  Gastrointestinal: Negative for abdominal pain and nausea.  Psychiatric/Behavioral: Negative for behavioral problems and confusion.    Vital Signs: BP (!) 150/79   Pulse 62   Temp 98.1 F (36.7 C) (Oral)   Resp 18   Ht 6\' 2"  (1.88 m)   Wt 240 lb (108.9 kg)   SpO2 98%   BMI 30.81 kg/m   Physical Exam Vitals reviewed.  HENT:     Mouth/Throat:     Mouth: Mucous membranes are moist.  Cardiovascular:     Rate and Rhythm: Normal rate and regular rhythm.     Heart sounds: Normal heart sounds.  Pulmonary:     Effort: Pulmonary effort is normal.     Breath sounds: Normal breath sounds.  Abdominal:     Palpations: Abdomen is soft.  Musculoskeletal:        General: Tenderness present. Normal range of motion.     Right lower leg: No edema.     Left lower leg: No edema.     Comments: Left foot tender to touch Wrapped in boot for left heel wound  Skin:    General: Skin is warm.  Neurological:     Mental Status: He is alert and oriented to person, place, and time.  Psychiatric:        Behavior: Behavior normal.     Imaging: VAS [place] ABI WITH/WO TBI  Result Date: 05/14/2020  LOWER EXTREMITY DOPPLER STUDY Patient Name:  NED KAKAR  Date of Exam:   05/11/2020 Medical Rec #: [id number]           Accession #:    [id number] Date of Birth: 05/10/1960           Patient Gender: M Patient Age:   59Y Exam Location:  [facility] Procedure:      VAS [place] ABI WITH/WO TBI Referring Phys: [id number] KEVIN P PATEL --------------------------------------------------------------------------------  Indications: Non  healing wound left heel,  after stepping on nail. High Risk Factors: Diabetes.  Performing Technologist: June Leap RDMS, RVT  Examination Guidelines: A complete evaluation includes at minimum, Doppler waveform signals and systolic blood pressure reading at the level of bilateral brachial, anterior tibial, and posterior tibial arteries, when vessel segments are accessible. Bilateral testing is considered an integral part of a complete examination. Photoelectric Plethysmograph (PPG) waveforms and toe systolic pressure readings are included as required and additional duplex testing as needed. Limited examinations for reoccurring indications may be performed as noted.  ABI Findings: +---------+------------------+-----+---------+--------+ Right    Rt Pressure (mmHg)IndexWaveform Comment  +---------+------------------+-----+---------+--------+ Brachial 160                                      +---------+------------------+-----+---------+--------+ ATA      185               1.16 triphasic         +---------+------------------+-----+---------+--------+ PTA      198               1.24 triphasic         +---------+------------------+-----+---------+--------+ Great Toe135               0.84 Normal            +---------+------------------+-----+---------+--------+ +---------+------------------+-----+---------+-------+ Left     Lt Pressure (mmHg)IndexWaveform Comment +---------+------------------+-----+---------+-------+ ATA      184               1.15 triphasic        +---------+------------------+-----+---------+-------+ PTA      187               1.17 triphasic        +---------+------------------+-----+---------+-------+ Great Toe103               0.64 Abnormal         +---------+------------------+-----+---------+-------+  Summary: Right: Resting right ankle-brachial index is within normal range. No evidence of significant right lower extremity arterial disease. The  right toe-brachial index is normal. Left: Resting left ankle-brachial index is within normal range. No evidence of significant left lower extremity arterial disease. The left toe-brachial index is abnormal.  *See table(s) above for measurements and observations.  Electronically signed by Harold Barban MD on 05/14/2020 at 2:57:09 PM.    Final    IR Radiologist Eval & Mgmt  Result Date: 05/22/2020 Please refer to notes tab for details about interventional procedure. (Op Note)   Labs:  CBC: Recent Labs    04/27/20 0133 04/28/20 0122 05/10/20 0921 06/05/20 0745  WBC 3.6* 3.5* 4.0 4.8  HGB 9.9* 9.6* 11.6* 12.3*  HCT 29.9* 28.9* 34.3* 37.2*  PLT 128* 126* 242 168    COAGS: Recent Labs    04/26/20 1351  INR 1.2  APTT 22*    BMP: Recent Labs    11/30/19 1122 01/02/20 1129 04/26/20 1351 04/27/20 0133 04/27/20 1457 04/28/20 0122 05/10/20 0921  NA 138   < > 135 134* 134* 136 140  K 3.8   < > 3.1* 3.0* 4.0 3.2* 3.9  CL 98   < > 99 103 102 103 102  CO2 21   < > 24 27 23 29 22   GLUCOSE 296*   < > 267* 212* 186* 185* 129*  BUN 16   < > 19 16 14 11 17   CALCIUM 8.8   < > 8.7* 8.1* 8.1*  8.1* 9.4  CREATININE 1.03   < > 1.17 1.20 1.06 1.01 0.80  GFRNONAA 80   < > >60 >60 >60 >60  --   GFRAA 92  --   --   --   --   --   --    < > = values in this interval not displayed.    LIVER FUNCTION TESTS: Recent Labs    11/30/19 1122 01/02/20 1129 04/26/20 1351  BILITOT 0.4 1.1 1.6*  AST 26 19 28   ALT 29 24 26   ALKPHOS 54 44 53  PROT 6.7 7.3 6.8  ALBUMIN 4.3 3.9 3.3*    TUMOR MARKERS: No results for input(s): AFPTM, CEA, CA199, CHROMGRNA in the last 8760 hours.  Assessment and Plan:  Left lower extremity heel wound Slow to little heeling per Podiatry ABI reflecting abnormal flow in left Scheduled now for aortaperipheral arteriogram wit possible intervention in IR with Dr Earleen Newport Risks and benefits were discussed with the patient including, but not limited to bleeding,  infection, vascular injury or contrast induced renal failure.  This interventional procedure involves the use of X-rays and because of the nature of the planned procedure, it is possible that we will have prolonged use of X-ray fluoroscopy.  Potential radiation risks to you include (but are not limited to) the following: - A slightly elevated risk for cancer  several years later in life. This risk is typically less than 0.5% percent. This risk is low in comparison to the normal incidence of human cancer, which is 33% for women and 50% for men according to the [place]. - Radiation induced injury can include skin redness, resembling a rash, tissue breakdown / ulcers and hair loss (which can be temporary or permanent).   The likelihood of either of these occurring depends on the difficulty of the procedure and whether you are sensitive to radiation due to previous procedures, disease, or genetic conditions.   IF your procedure requires a prolonged use of radiation, you will be notified and given written instructions for further action.  It is your responsibility to monitor the irradiated area for the 2 weeks following the procedure and to notify your physician if you are concerned that you have suffered a radiation induced injury.    All of the patient's questions were answered, patient is agreeable to proceed.  Consent signed and in chart.  Thank you for this interesting consult.  I greatly enjoyed meeting [name] [name] and look forward to participating in their care.  A copy of this report was sent to the requesting provider on this date.  Electronically Signed: Lavonia Drafts, PA-C 06/05/2020, 8:22 AM   I spent a total of  30 Minutes   in face to face in clinical consultation, greater than 50% of which was counseling/coordinating care for left lower extremity arteriogram with possible intervention

## 2020-06-05 NOTE — Progress Notes (Signed)
Angiomax stopped at 1312.  Procedure completed at 1334.  Patient to return to Radiology Nurses Station until sheath removed.  Left Femoral sheath in place. Capped, dressing is clean, dry, and intact.

## 2020-06-05 NOTE — Procedures (Signed)
Interventional Radiology Procedure Note  Procedure:   US guided access left femoral artery US guided access left dorsalis pedis  Left LE angiogram.   Findings:  High bifurcation of left CFA, with the profunda artery anterior to the SFA  No significant disease of the fem-pop or tibial segments.  No intervention performed.   Medication: Patient was on Angiomax [facility] during the case.   EBL:  ~50cc .  Complications: None  Recommendations:  - Left femoral access will stay in place ~30-63min while the angiomax runs its course - left AT access will stay in place 30-39min  - access removal in VIR in 30-45 min - recovery in short stay then 3 hours - routine wound care - Do not submerge for 7 days - follow up on schedule with VIR  - follow up on schedule with wound care  Signed,  Dulcy Fanny. Earleen Newport, DO

## 2020-06-07 ENCOUNTER — Other Ambulatory Visit: Payer: Self-pay

## 2020-06-07 ENCOUNTER — Ambulatory Visit
Admission: RE | Admit: 2020-06-07 | Discharge: 2020-06-07 | Disposition: A | Discharge status: Home / Self Care | Payer: Medicare Other | Source: Ambulatory Visit | Attending: Podiatry | Admitting: Podiatry

## 2020-06-07 DIAGNOSIS — L98499 Non-pressure chronic ulcer of skin of other sites with unspecified severity: Secondary | ICD-10-CM | POA: Diagnosis not present

## 2020-06-07 DIAGNOSIS — M86172 Other acute osteomyelitis, left ankle and foot: Secondary | ICD-10-CM | POA: Diagnosis not present

## 2020-06-07 DIAGNOSIS — M6258 Muscle wasting and atrophy, not elsewhere classified, other site: Secondary | ICD-10-CM | POA: Diagnosis not present

## 2020-06-07 DIAGNOSIS — M7989 Other specified soft tissue disorders: Secondary | ICD-10-CM | POA: Diagnosis not present

## 2020-06-07 DIAGNOSIS — L97423 Non-pressure chronic ulcer of left heel and midfoot with necrosis of muscle: Secondary | ICD-10-CM

## 2020-06-08 ENCOUNTER — Telehealth: Payer: Self-pay | Admitting: Podiatry

## 2020-06-08 ENCOUNTER — Other Ambulatory Visit: Payer: Self-pay

## 2020-06-08 MED FILL — Metformin HCl Tab ER 24HR 500 MG: ORAL | 30 days supply | Qty: 120 | Fill #0 | Status: AC

## 2020-06-08 MED FILL — Hydrochlorothiazide Tab 25 MG: ORAL | 30 days supply | Qty: 30 | Fill #0 | Status: AC

## 2020-06-13 ENCOUNTER — Other Ambulatory Visit: Payer: Self-pay

## 2020-06-13 ENCOUNTER — Ambulatory Visit: Payer: Medicare Other | Admitting: Podiatry

## 2020-06-13 DIAGNOSIS — L97423 Non-pressure chronic ulcer of left heel and midfoot with necrosis of muscle: Secondary | ICD-10-CM

## 2020-06-13 DIAGNOSIS — E1142 Type 2 diabetes mellitus with diabetic polyneuropathy: Secondary | ICD-10-CM | POA: Diagnosis not present

## 2020-06-14 ENCOUNTER — Encounter: Payer: Self-pay | Admitting: Podiatry

## 2020-06-14 NOTE — Progress Notes (Signed)
Subjective:  Patient ID: [name] [name], male    DOB: 02-Dec-1960,  MRN: [id number]  Chief Complaint  Patient presents with  . Foot Ulcer    Heel ulcer to left foot     60 y.o. male presents for wound care.  Patient presents with a follow-up of left heel ulceration that seems to be about the same.  He states he is doing well.  He had angiogram done by Dr. Earleen Newport which did not show any significant disease that he could intervene on.  He denies any other acute complaints.  He has been doing local wound care with Betadine wet-to-dry   Review of Systems: Negative except as noted in the HPI. Denies N/V/F/Ch.  Past Medical History:  Diagnosis Date  . Anxiety   . Arthritis   . Bipolar 1 disorder ([place])   . Blood transfusion without reported diagnosis   . Cataract   . Chronic pain   . Depressed bipolar disorder ([place])   . Depression   . Diabetes mellitus without complication ([place])   . Diabetic neuropathy ([place])   . GERD (gastroesophageal reflux disease)   . Herpes zoster 12/05/2008   Qualifier: Diagnosis of  By: Ronnald Ramp MD, Arvid Right.   . High cholesterol   . 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  Coupled with high doses of loperamide used for pain control.  Unintentional overdose -intractable VT leading to cardiogenic shock - ECMO  . Hyperlipidemia   . Hypertension   . Neuromuscular disorder ([place])    nerve damage back, neck, and shoulder  . Neuropathy in diabetes ([place])   . Sleep apnea    has CPAP but cannot use it  . Substance abuse ([place])    in past     Current Outpatient Medications:  .  atorvastatin (LIPITOR) 40 MG tablet, TAKE 1 TABLET BY MOUTH DAILY AT 6 PM. (Patient taking differently: Take 40 mg by mouth daily.), Disp: 30 tablet, Rfl: 5 .  buprenorphine (SUBUTEX) 8 MG SUBL SL tablet, Place 8 mg under the tongue every 8 (eight) hours., Disp: , Rfl:  .  doxycycline (VIBRA-TABS) 100 MG tablet, Take 1 tablet  (100 mg total) by mouth 2 (two) times daily., Disp: 20 tablet, Rfl: 1 .  Dulaglutide (TRULICITY) 2.13 YQ/6.5HQ SOPN, Inject 0.75 mg into the skin once a week. (Patient taking differently: Inject 0.75 mg into the skin every Wednesday.), Disp: 2 mL, Rfl: 2 .  fenofibrate (TRICOR) 145 MG tablet, Take 1 tablet (145 mg total) by mouth daily., Disp: 30 tablet, Rfl: 5 .  ferrous sulfate 325 (65 FE) MG EC tablet, Take 1 tablet (325 mg total) by mouth daily with breakfast., Disp: 30 tablet, Rfl: 0 .  hydrochlorothiazide (HYDRODIURIL) 25 MG tablet, TAKE 1 TABLET (25 MG TOTAL) BY MOUTH DAILY. (Patient taking differently: Take 25 mg by mouth daily.), Disp: 30 tablet, Rfl: 5 .  Lancets (FREESTYLE) lancets, Use as instructed, Disp: , Rfl:  .  losartan (COZAAR) 100 MG tablet, Take 1 tablet (100 mg total) by mouth daily., Disp: 30 tablet, Rfl: 5 .  metFORMIN (GLUCOPHAGE-XR) 500 MG 24 hr tablet, TAKE 2 TABLETS (1,000 MG TOTAL) BY MOUTH 2 (TWO) TIMES DAILY. (Patient taking differently: Take 1,000 mg by mouth 2 (two) times daily.), Disp: 120 tablet, Rfl: 5 .  omeprazole (PRILOSEC) 40 MG capsule, Take 40 mg by mouth at bedtime., Disp: , Rfl:  .  ondansetron (ZOFRAN) 4 MG tablet, Take 1  tablet (4 mg total) by mouth every 8 (eight) hours as needed for nausea or vomiting. (Patient not taking: Reported on 05/31/2020), Disp: 20 tablet, Rfl: 0 .  potassium chloride SA (KLOR-CON) 20 MEQ tablet, Take 2 tablets (40 mEq total) by mouth daily for 5 days., Disp: 10 tablet, Rfl: 0 .  prazosin (MINIPRESS) 2 MG capsule, Take 2 mg by mouth at bedtime., Disp: , Rfl:  .  QUEtiapine (SEROQUEL) 300 MG tablet, Take 150 mg by mouth at bedtime., Disp: , Rfl:  .  sertraline (ZOLOFT) 100 MG tablet, Take 100 mg by mouth daily., Disp: , Rfl:  .  Syringe/Needle, Disp, 30G X 1/2" 1 ML MISC, UAD - use as directed, Disp: 100 each, Rfl: 5 .  vitamin B-12 (CYANOCOBALAMIN) 1000 MCG tablet, Take 1 tablet (1,000 mcg total) by mouth daily., Disp: 30 tablet,  Rfl: 0  Social History   Tobacco Use  Smoking Status Never Smoker  Smokeless Tobacco Never Used    Allergies  Allergen Reactions  . Heparin Other (See Comments)    HIT Ab negative on 02/20/15, but SRA POSITIVE   . Oxytetracycline Rash and Other (See Comments)  . Del-Mycin [Erythromycin]     All mycin drugs - unknown reaction  . Other     Mycins unknown reaction  . Sulfa Antibiotics     Unknown reaction  . Sulfonamide Derivatives     Unknown reaction   Objective:   There were no vitals filed for this visit. There is no height or weight on file to calculate BMI. Constitutional Well developed. Well nourished.  Vascular Dorsalis pedis pulses faintly palpable bilaterally. Posterior tibial pulses faintly palpable bilaterally. Capillary refill normal to all digits.  No cyanosis or clubbing noted. Pedal hair growth normal.  Neurologic Normal speech. Oriented to person, place, and time. Protective sensation absent  Dermatologic Wound Location: Left heel ulceration probing down to deep tissue Wound Base: Mixed Granular/Fibrotic Peri-wound: Reddened Exudate: Scant/small amount Serosanguinous exudate Wound Measurements: -See below  Orthopedic: No pain to palpation either foot.   Radiographs: 3 views of skeletally mature the left foot: Some debris noted however no nail noted.  Soft tissue defect noted to the heel.  No signs of osteomyelitis noted.  No cortical destruction noted. Assessment:   1. Heel ulcer, left, with necrosis of muscle ([place])   2. Diabetic polyneuropathy associated with type 2 diabetes mellitus ([place])    Plan:  Patient was evaluated and treated and all questions answered.  Ulcer left heel ulceration probing down to deep tissues/capsule with underlying concern for osteomyelitis -Debridement as below. -Dressed with Betadine wet-to-dry, DSD. -Continue off-loading with cam boot -Doxycycline was dispensed for skin and soft tissue prophylaxis I plan on keeping him  on doxycycline until resolvent of the wound. -Given the location of the wound patient is a high risk of major amputation if the wound further regresses.  Given his A1c of 7.1 and the difficulty of healing the wound I discussed with the patient that he is at high risk of amputation.  He states understanding. -ABIs were reviewed with the patient.  Angiogram was reviewed with the patient at this time no intervention was indicated as patient does not have any proximal segment disease. -MRI was reviewed of the heel which shows already signs for osteomyelitis.  However clinically the wound has decreased in depth and I am no longer able to probe down to bone.  At this time I discussed with the patient that we can continue doing local  wound care as opposed to undergoing surgery for debridement and partial calcanectomy.  Patient states understanding and would like to continue with local wound care.  However I have a low threshold to take him to the operating room.  He states understanding.  Procedure: Excisional Debridement of Wound~stagnant Tool: Sharp chisel blade/tissue nipper Rationale: Removal of non-viable soft tissue from the wound to promote healing.  Anesthesia: none Pre-Debridement Wound Measurements: 3 cm x 2.5 cm x 0.5 cm  Post-Debridement Wound Measurements: 3.2 cm x 2.7 cm x 0.5 cm  Type of Debridement: Sharp Excisional Tissue Removed: Non-viable soft tissue Blood loss: Minimal (<50cc) Depth of Debridement: Deep tissue/capsule Technique: Sharp excisional debridement to bleeding, viable wound base.  Wound Progress: The depth has decreased. Dressing: Dry, sterile, compression dressing. Disposition: Patient tolerated procedure well. Patient to return in 1 week for follow-up.  No follow-ups on file.

## 2020-06-19 ENCOUNTER — Other Ambulatory Visit: Payer: Self-pay

## 2020-06-19 ENCOUNTER — Ambulatory Visit (INDEPENDENT_AMBULATORY_CARE_PROVIDER_SITE_OTHER): Payer: Medicare Other | Admitting: Internal Medicine

## 2020-06-19 VITALS — BP 144/75 | HR 58 | Wt 229.0 lb

## 2020-06-19 DIAGNOSIS — D509 Iron deficiency anemia, unspecified: Secondary | ICD-10-CM | POA: Diagnosis not present

## 2020-06-19 DIAGNOSIS — E538 Deficiency of other specified B group vitamins: Secondary | ICD-10-CM

## 2020-06-19 DIAGNOSIS — I10 Essential (primary) hypertension: Secondary | ICD-10-CM | POA: Diagnosis not present

## 2020-06-19 DIAGNOSIS — E782 Mixed hyperlipidemia: Secondary | ICD-10-CM | POA: Diagnosis not present

## 2020-06-19 DIAGNOSIS — E876 Hypokalemia: Secondary | ICD-10-CM | POA: Diagnosis not present

## 2020-06-19 MED ORDER — LOSARTAN POTASSIUM 100 MG PO TABS
100.0000 mg | ORAL_TABLET | Freq: Every day | ORAL | 1 refills | Status: DC
  Filled 2020-06-19 – 2020-06-29 (×2): qty 90, 90d supply, fill #0

## 2020-06-19 MED ORDER — FENOFIBRATE 145 MG PO TABS
145.0000 mg | ORAL_TABLET | Freq: Every day | ORAL | 1 refills | Status: DC
  Filled 2020-06-19 – 2020-06-29 (×2): qty 90, 90d supply, fill #0

## 2020-06-19 MED ORDER — ATORVASTATIN CALCIUM 40 MG PO TABS
40.0000 mg | ORAL_TABLET | Freq: Every day | ORAL | 1 refills | Status: DC
  Filled 2020-06-19 – 2020-06-29 (×2): qty 90, 90d supply, fill #0

## 2020-06-19 MED ORDER — OMEPRAZOLE 40 MG PO CPDR
40.0000 mg | DELAYED_RELEASE_CAPSULE | Freq: Every day | ORAL | 1 refills | Status: DC
  Filled 2020-06-19 – 2020-06-29 (×2): qty 30, 30d supply, fill #0

## 2020-06-19 NOTE — Progress Notes (Signed)
Follow up.

## 2020-06-19 NOTE — Progress Notes (Signed)
Subjective:    [name] [name] - 60 y.o. male MRN [id number]  Date of birth: 01-Mar-1960  HPI  [name] [name] is here for follow up.  Health Maintenance:  Health Maintenance Due  Topic Date Due  . Pneumococcal Vaccine 79-54 Years old (1 of 2 - PPSV23) Never done  . Zoster Vaccines- Shingrix (1 of 2) Never done  . COLONOSCOPY (Pts 45-23yrs Insurance coverage will need to be confirmed)  08/31/2015  . OPHTHALMOLOGY EXAM  09/04/2017  . COVID-19 Vaccine (2 - Booster for [employer] series) 10/03/2019  . FOOT EXAM  03/03/2020    -  reports that he has never smoked. He has never used smokeless tobacco. - Review of Systems: Per HPI. - Past Medical History: Patient Active Problem List   Diagnosis Date Noted  . Diabetic foot infection ([place]) 04/26/2020  . Penetrating wound of left foot   . Skin lesion 12/07/2019  . Chronic pain disorder 09/13/2019  . DDD (degenerative disc disease), lumbar 09/13/2019  . Dyslipidemia 09/13/2019  . Obesity 09/13/2019  . Peripheral neuropathy 09/13/2019  . Sleep apnea   . Prolonged Q-T interval on ECG 12/03/2016  . Drug-induced torsades de pointes ([place]) 12/03/2016  . Thrombocytopenia ([place]) 11/29/2016  . Personal history of ECMO 11/26/2016  . Personal history of spine surgery 11/26/2016  . S/P rotator cuff repair 11/26/2016  . 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 Mild concentric left ventricular hypertrophy (LVH) 09/11/2016  . DJD (degenerative joint disease) of cervical spine 02/26/2016  . Tinea pedis of both feet 06/03/2015  . Pre-ulcerative calluses 06/01/2015  . Type 2 diabetes mellitus with diabetic polyneuropathy ([place]) 06/09/2014  . Generalized anxiety disorder 06/19/2012  . Herpes zoster 12/05/2008  . Mixed hyperlipidemia 05/11/2008  . DEPRESSION 05/11/2008  . Essential hypertension 05/11/2008  . GERD 05/11/2008  . OSTEOARTHRITIS 05/11/2008  . LOW BACK PAIN 05/11/2008  . COLONIC POLYPS, HX  OF 05/11/2008   - Medications: reviewed and updated   Objective:   Physical Exam BP (!) 144/75   Pulse (!) 58   Wt 229 lb (103.9 kg)   SpO2 97%   BMI 29.40 kg/m  Physical Exam Constitutional:      General: He is not in acute distress.    Appearance: He is not diaphoretic.  Cardiovascular:     Rate and Rhythm: Normal rate.  Pulmonary:     Effort: Pulmonary effort is normal. No respiratory distress.  Musculoskeletal:        General: Normal range of motion.  Skin:    General: Skin is warm and dry.  Neurological:     Mental Status: He is alert and oriented to person, place, and time.  Psychiatric:        Mood and Affect: Affect normal.        Judgment: Judgment normal.            Assessment & Plan:   1. Vitamin B12 deficiency 187 on 4/16> 553 on 4/28 after supplementation. Still taking 1000 mcg daily. Wants to know if this should be continued. Monitor.   - Vitamin B12  2. Hypokalemia History of hypokalemia likely secondary to medication side effect. K 3.9 on 4/28. He is taking 39mEq daily. Will monitor level and plan to continue if stable.  - Basic Metabolic Panel  3. Iron deficiency anemia, unspecified iron deficiency anemia type Iron 21 on 4/16 > 72 on 4/28 after repletion. HgB was still mildly low at that time with  result of 11.6. Continued ferrous sulfate. Monitor today.  - Iron, TIBC and Ferritin Panel - CBC  4. Essential hypertension BP near goal. Asymptomatic. Continue current regimen.  - losartan (COZAAR) 100 MG tablet; Take 1 tablet (100 mg total) by mouth daily.  Dispense: 90 tablet; Refill: 1  5. Mixed hyperlipidemia - fenofibrate (TRICOR) 145 MG tablet; Take 1 tablet (145 mg total) by mouth daily.  Dispense: 90 tablet; Refill: 1 - atorvastatin (LIPITOR) 40 MG tablet; Take 1 tablet (40 mg total) by mouth daily.  Dispense: 90 tablet; Refill: [place], D.O. 06/19/2020, 4:16 PM [facility] at [facility]

## 2020-06-19 NOTE — Progress Notes (Signed)
Medication concerns  -B12 -potassium -ferrous sulfate

## 2020-06-20 ENCOUNTER — Other Ambulatory Visit: Payer: Self-pay | Admitting: Internal Medicine

## 2020-06-20 ENCOUNTER — Other Ambulatory Visit: Payer: Self-pay

## 2020-06-20 LAB — CBC
Hematocrit: 36.4 % — ABNORMAL LOW (ref 37.5–51.0)
Hemoglobin: 12.3 g/dL — ABNORMAL LOW (ref 13.0–17.7)
MCH: 26.2 pg — ABNORMAL LOW (ref 26.6–33.0)
MCHC: 33.8 g/dL (ref 31.5–35.7)
MCV: 78 fL — ABNORMAL LOW (ref 79–97)
Platelets: 194 10*3/uL (ref 150–450)
RBC: 4.69 x10E6/uL (ref 4.14–5.80)
RDW: 14.5 % (ref 11.6–15.4)
WBC: 5.8 10*3/uL (ref 3.4–10.8)

## 2020-06-20 LAB — VITAMIN B12: Vitamin B-12: 646 pg/mL (ref 232–1245)

## 2020-06-20 LAB — BASIC METABOLIC PANEL
BUN/Creatinine Ratio: 24 — ABNORMAL HIGH (ref 9–20)
BUN: 22 mg/dL (ref 6–24)
CO2: 23 mmol/L (ref 20–29)
Calcium: 9.3 mg/dL (ref 8.7–10.2)
Chloride: 100 mmol/L (ref 96–106)
Creatinine, Ser: 0.92 mg/dL (ref 0.76–1.27)
Glucose: 190 mg/dL — ABNORMAL HIGH (ref 65–99)
Potassium: 4 mmol/L (ref 3.5–5.2)
Sodium: 140 mmol/L (ref 134–144)
eGFR: 96 mL/min/{1.73_m2} (ref >59)

## 2020-06-20 LAB — IRON,TIBC AND FERRITIN PANEL
Ferritin: 30 ng/mL (ref 30–400)
Iron Saturation: 14 % — ABNORMAL LOW (ref 15–55)
Iron: 54 ug/dL (ref 38–169)
Total Iron Binding Capacity: 378 ug/dL (ref 250–450)
UIBC: 324 ug/dL (ref 111–343)

## 2020-06-20 MED ORDER — FERROUS SULFATE 325 (65 FE) MG PO TBEC
325.0000 mg | DELAYED_RELEASE_TABLET | Freq: Every day | ORAL | 2 refills | Status: DC
  Filled 2020-06-20 – 2020-06-29 (×2): qty 30, 30d supply, fill #0

## 2020-06-20 MED ORDER — POTASSIUM CHLORIDE CRYS ER 20 MEQ PO TBCR
20.0000 meq | EXTENDED_RELEASE_TABLET | Freq: Every day | ORAL | 2 refills | Status: DC
Start: 2020-06-20 — End: 2021-06-21
  Filled 2020-06-20 – 2020-06-29 (×2): qty 30, 30d supply, fill #0

## 2020-06-21 MED ORDER — IOHEXOL 300 MG/ML  SOLN
100.0000 mL | Freq: Once | INTRAMUSCULAR | Status: AC | PRN
  Administered 2020-06-21: 85 mL via INTRA_ARTERIAL

## 2020-06-25 NOTE — Progress Notes (Signed)
Called patient to go over lab results. Left message on voicemail to view MyChart. May call office if he has additional questions.

## 2020-06-26 ENCOUNTER — Other Ambulatory Visit: Payer: Self-pay

## 2020-06-26 NOTE — Progress Notes (Signed)
Left message on voicemail to return call.

## 2020-06-27 ENCOUNTER — Other Ambulatory Visit: Payer: Self-pay

## 2020-06-28 ENCOUNTER — Other Ambulatory Visit: Payer: Self-pay | Admitting: Interventional Radiology

## 2020-06-28 DIAGNOSIS — L97429 Non-pressure chronic ulcer of left heel and midfoot with unspecified severity: Secondary | ICD-10-CM

## 2020-06-29 ENCOUNTER — Other Ambulatory Visit: Payer: Self-pay

## 2020-06-29 ENCOUNTER — Ambulatory Visit: Payer: Medicare Other | Admitting: Podiatry

## 2020-06-29 DIAGNOSIS — E1142 Type 2 diabetes mellitus with diabetic polyneuropathy: Secondary | ICD-10-CM

## 2020-06-29 DIAGNOSIS — L97422 Non-pressure chronic ulcer of left heel and midfoot with fat layer exposed: Secondary | ICD-10-CM | POA: Diagnosis not present

## 2020-07-04 ENCOUNTER — Other Ambulatory Visit: Payer: Self-pay

## 2020-07-04 ENCOUNTER — Encounter: Payer: Self-pay | Admitting: Podiatry

## 2020-07-04 NOTE — Progress Notes (Signed)
Subjective:  Patient ID: [name] [name], male    DOB: [birth year],  MRN: [id number]  Chief Complaint  Patient presents with   Foot Ulcer    Heel ulcer     60 y.o. male presents for wound care.  Patient presents with follow-up of left heel ulceration that seems to be decreasing now.  Patient states is looking a lot better.  He has been doing Betadine wet-to-dry dressing he denies any other acute complaints.   Review of Systems: Negative except as noted in the HPI. Denies N/V/F/Ch.  Past Medical History:  Diagnosis Date   Anxiety    Arthritis    Bipolar 1 disorder ([place])    Blood transfusion without reported diagnosis    Cataract    Chronic pain    Depressed bipolar disorder ([place])    Depression    Diabetes mellitus without complication ([place])    Diabetic neuropathy ([place])    GERD (gastroesophageal reflux disease)    Herpes zoster 12/05/2008   Qualifier: Diagnosis of  By: Ronnald Ramp MD, Arvid Right.    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  Coupled with high doses of loperamide used for pain control.  Unintentional overdose -intractable VT leading to cardiogenic shock - ECMO   Hyperlipidemia    Hypertension    Neuromuscular disorder ([place])    nerve damage back, neck, and shoulder   Neuropathy in diabetes ([place])    Sleep apnea    has CPAP but cannot use it   Substance abuse ([place])    in past     Current Outpatient Medications:    atorvastatin (LIPITOR) 40 MG tablet, Take 1 tablet (40 mg total) by mouth daily., Disp: 90 tablet, Rfl: 1   buprenorphine (SUBUTEX) 8 MG SUBL SL tablet, Place 8 mg under the tongue every 8 (eight) hours., Disp: , Rfl:    doxycycline (VIBRA-TABS) 100 MG tablet, Take 1 tablet (100 mg total) by mouth 2 (two) times daily., Disp: 20 tablet, Rfl: 1   Dulaglutide (TRULICITY) 0.81 KG/8.1EH SOPN, Inject 0.75 mg into the skin once a week. (Patient taking differently: Inject 0.75  mg into the skin every Wednesday.), Disp: 2 mL, Rfl: 2   fenofibrate (TRICOR) 145 MG tablet, Take 1 tablet (145 mg total) by mouth daily., Disp: 90 tablet, Rfl: 1   ferrous sulfate 325 (65 FE) MG EC tablet, Take 1 tablet (325 mg total) by mouth daily with breakfast., Disp: 30 tablet, Rfl: 2   hydrochlorothiazide (HYDRODIURIL) 25 MG tablet, TAKE 1 TABLET (25 MG TOTAL) BY MOUTH DAILY. (Patient taking differently: Take 25 mg by mouth daily.), Disp: 30 tablet, Rfl: 5   Lancets (FREESTYLE) lancets, Use as instructed, Disp: , Rfl:    losartan (COZAAR) 100 MG tablet, Take 1 tablet (100 mg total) by mouth daily., Disp: 90 tablet, Rfl: 1   metFORMIN (GLUCOPHAGE-XR) 500 MG 24 hr tablet, TAKE 2 TABLETS (1,000 MG TOTAL) BY MOUTH 2 (TWO) TIMES DAILY. (Patient taking differently: Take 1,000 mg by mouth 2 (two) times daily.), Disp: 120 tablet, Rfl: 5   omeprazole (PRILOSEC) 40 MG capsule, Take 1 capsule (40 mg total) by mouth at bedtime., Disp: 30 capsule, Rfl: 1   ondansetron (ZOFRAN) 4 MG tablet, Take 1 tablet (4 mg total) by mouth every 8 (eight) hours as needed for nausea or vomiting., Disp: 20 tablet, Rfl: 0   potassium chloride SA (KLOR-CON) 20 MEQ tablet, Take 1 tablet (  20 mEq total) by mouth daily., Disp: 30 tablet, Rfl: 2   prazosin (MINIPRESS) 2 MG capsule, Take 2 mg by mouth at bedtime., Disp: , Rfl:    QUEtiapine (SEROQUEL) 300 MG tablet, Take 150 mg by mouth at bedtime., Disp: , Rfl:    sertraline (ZOLOFT) 100 MG tablet, Take 100 mg by mouth daily., Disp: , Rfl:    Syringe/Needle, Disp, 30G X 1/2" 1 ML MISC, UAD - use as directed, Disp: 100 each, Rfl: 5   vitamin B-12 (CYANOCOBALAMIN) 1000 MCG tablet, Take 1 tablet (1,000 mcg total) by mouth daily., Disp: 30 tablet, Rfl: 0  Social History   Tobacco Use  Smoking Status Never  Smokeless Tobacco Never    Allergies  Allergen Reactions   Heparin Other (See Comments)    HIT Ab negative on 02/20/15, but SRA POSITIVE    Oxytetracycline Rash and Other  (See Comments)   Del-Mycin [Erythromycin]     All mycin drugs - unknown reaction   Other     Mycins unknown reaction   Sulfa Antibiotics     Unknown reaction   Sulfonamide Derivatives     Unknown reaction   Objective:   There were no vitals filed for this visit. There is no height or weight on file to calculate BMI. Constitutional Well developed. Well nourished.  Vascular Dorsalis pedis pulses faintly palpable bilaterally. Posterior tibial pulses faintly palpable bilaterally. Capillary refill normal to all digits.  No cyanosis or clubbing noted. Pedal hair growth normal.  Neurologic Normal speech. Oriented to person, place, and time. Protective sensation absent  Dermatologic Wound Location: Left heel ulceration probing down to deep tissue Wound Base: Mixed Granular/Fibrotic Peri-wound: Reddened Exudate: Scant/small amount Serosanguinous exudate Wound Measurements: -See below  Orthopedic: No pain to palpation either foot.   Radiographs: 3 views of skeletally mature the left foot: Some debris noted however no nail noted.  Soft tissue defect noted to the heel.  No signs of osteomyelitis noted.  No cortical destruction noted. Assessment:   1. Heel ulcer, left, with fat layer exposed ([place])   2. Diabetic polyneuropathy associated with type 2 diabetes mellitus ([place])     Plan:  Patient was evaluated and treated and all questions answered.  Ulcer left heel ulceration probing down to deep tissues/capsule with underlying concern for osteomyelitis -Debridement as below. -Dressed with Betadine wet-to-dry, DSD. -Continue off-loading with cam boot -Doxycycline was dispensed for skin and soft tissue prophylaxis I plan on keeping him on doxycycline until resolvent of the wound. -Given the location of the wound patient is a high risk of major amputation if the wound further regresses.  Given his A1c of 8.5 and the difficulty of healing the wound I discussed with the patient that he is at  high risk of amputation.  He states understanding. -ABIs were reviewed with the patient.  Angiogram was reviewed with the patient at this time no intervention was indicated as patient does not have any proximal segment disease. -MRI was reviewed of the heel which shows already signs for osteomyelitis.  However clinically the wound has decreased in depth and I am no longer able to probe down to bone and it continues to decrease.  At this time I discussed with the patient that we can continue doing local wound care as opposed to undergoing surgery for debridement and partial calcanectomy.  Patient states understanding and would like to continue with local wound care.  However I have a low threshold to take him to the operating  room.  He states understanding.  Procedure: Excisional Debridement of Wound~stagnant Tool: Sharp chisel blade/tissue nipper Rationale: Removal of non-viable soft tissue from the wound to promote healing.  Anesthesia: none Pre-Debridement Wound Measurements: 2.5 cm x 2.0 cm x 0.3 cm  Post-Debridement Wound Measurements: 2.6 cm x 2.4 cm x 0.3 cm  Type of Debridement: Sharp Excisional Tissue Removed: Non-viable soft tissue Blood loss: Minimal (<50cc) Depth of Debridement: Deep tissue/capsule Technique: Sharp excisional debridement to bleeding, viable wound base.  Wound Progress: The size and the depth continues to decrease. Dressing: Dry, sterile, compression dressing. Disposition: Patient tolerated procedure well. Patient to return in 1 week for follow-up.  No follow-ups on file.

## 2020-07-09 ENCOUNTER — Ambulatory Visit (INDEPENDENT_AMBULATORY_CARE_PROVIDER_SITE_OTHER): Payer: Medicare Other | Admitting: Podiatry

## 2020-07-09 ENCOUNTER — Other Ambulatory Visit: Payer: Self-pay

## 2020-07-09 ENCOUNTER — Encounter: Payer: Self-pay | Admitting: Podiatry

## 2020-07-09 DIAGNOSIS — B351 Tinea unguium: Secondary | ICD-10-CM | POA: Diagnosis not present

## 2020-07-09 DIAGNOSIS — M79675 Pain in left toe(s): Secondary | ICD-10-CM

## 2020-07-09 DIAGNOSIS — E1142 Type 2 diabetes mellitus with diabetic polyneuropathy: Secondary | ICD-10-CM

## 2020-07-09 DIAGNOSIS — M79674 Pain in right toe(s): Secondary | ICD-10-CM

## 2020-07-09 DIAGNOSIS — I999 Unspecified disorder of circulatory system: Secondary | ICD-10-CM | POA: Diagnosis not present

## 2020-07-09 DIAGNOSIS — L97422 Non-pressure chronic ulcer of left heel and midfoot with fat layer exposed: Secondary | ICD-10-CM | POA: Diagnosis not present

## 2020-07-09 NOTE — Progress Notes (Signed)
Complaint:  Visit Type: Patient returns to my office for continued preventative foot care services. Complaint: Patient states" his nails have grown thick and long are painful walking and wearing his shoes.  Patient is diabetic with neuropathy.  Patient presents for preventative foot care services.. Patient is under care for osteomyelitis left heel by Dr.  Posey Pronto.  Podiatric Exam: Vascular: dorsalis pedis and posterior tibial pulses are palpable bilateral. Capillary return is immediate. Temperature gradient is WNL. Skin turgor WNL  Sensorium: Diminished Semmes Weinstein monofilament test. Normal tactile sensation bilaterally. Nail Exam: Pt has thick disfigured discolored nails with subungual debris noted bilateral entire nail hallux through fifth toenails Ulcer Exam: There is no evidence of ulcer hallux  B/L. Orthopedic Exam: Muscle tone and strength are WNL. No limitations in general ROM. No crepitus or effusions noted. Foot type and digits show no abnormalities. Bony prominences are unremarkable. Mild swelling noted.  HAV right foot. Skin: No Porokeratosis. Bandaged skin ulcer left heel  Diagnosis:  Onychomycosis, , Pain in right toe, pain in left toes,   Treatment & Plan Procedures and Treatment: Consent by patient was obtained for treatment procedures. The patient understood the discussion of treatment and procedures well. All questions were answered thoroughly reviewed. Debridement of mycotic and hypertrophic toenails, 1 through 5 bilateral and clearing of subungual debris. No ulceration, no infection noted.   Return Visit-Office Procedure: Patient instructed to return to the office for a follow up visit 9 weeks  .for continued evaluation and treatment.    Gardiner Barefoot DPM

## 2020-07-13 ENCOUNTER — Telehealth: Payer: Self-pay | Admitting: Podiatry

## 2020-07-13 MED ORDER — DOXYCYCLINE HYCLATE 100 MG PO TABS: 100.0000 mg | ORAL_TABLET | Freq: Two times a day (BID) | ORAL | 0 refills | Status: AC

## 2020-07-13 NOTE — Telephone Encounter (Signed)
Pt requesting refill on antibiotics.  Sent to [facility] on [address]. Please advise.

## 2020-07-13 NOTE — Addendum Note (Signed)
Addended by: Boneta Lucks on: 07/13/2020 11:45 AM   Modules accepted: Orders

## 2020-07-20 ENCOUNTER — Other Ambulatory Visit: Payer: Self-pay

## 2020-07-20 ENCOUNTER — Ambulatory Visit: Payer: Medicare Other | Admitting: Podiatry

## 2020-07-20 ENCOUNTER — Encounter: Payer: Self-pay | Admitting: Podiatry

## 2020-07-20 DIAGNOSIS — L97422 Non-pressure chronic ulcer of left heel and midfoot with fat layer exposed: Secondary | ICD-10-CM

## 2020-07-20 DIAGNOSIS — E1142 Type 2 diabetes mellitus with diabetic polyneuropathy: Secondary | ICD-10-CM | POA: Diagnosis not present

## 2020-07-20 NOTE — Progress Notes (Signed)
Subjective:  Patient ID: [name] [name], male    DOB: [birth year],  MRN: [id number]  Chief Complaint  Patient presents with   Foot Ulcer    Left heel ulcer     60 y.o. male presents for wound care.  Patient presents with follow-up of left heel ulceration that seems to be decreasing now.  Patient states is looking a lot better.  He has been doing Betadine wet-to-dry dressing he denies any other acute complaints.   Review of Systems: Negative except as noted in the HPI. Denies N/V/F/Ch.  Past Medical History:  Diagnosis Date   Anxiety    Arthritis    Bipolar 1 disorder ([place])    Blood transfusion without reported diagnosis    Cataract    Chronic pain    Depressed bipolar disorder ([place])    Depression    Diabetes mellitus without complication ([place])    Diabetic neuropathy ([place])    GERD (gastroesophageal reflux disease)    Herpes zoster 12/05/2008   Qualifier: Diagnosis of  By: Ronnald Ramp MD, Arvid Right.    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  Coupled with high doses of loperamide used for pain control.  Unintentional overdose -intractable VT leading to cardiogenic shock - ECMO   Hyperlipidemia    Hypertension    Neuromuscular disorder ([place])    nerve damage back, neck, and shoulder   Neuropathy in diabetes ([place])    Sleep apnea    has CPAP but cannot use it   Substance abuse ([place])    in past     Current Outpatient Medications:    atorvastatin (LIPITOR) 40 MG tablet, Take 1 tablet (40 mg total) by mouth daily., Disp: 90 tablet, Rfl: 1   buprenorphine (SUBUTEX) 8 MG SUBL SL tablet, Place 8 mg under the tongue every 8 (eight) hours., Disp: , Rfl:    doxycycline (VIBRA-TABS) 100 MG tablet, Take 1 tablet (100 mg total) by mouth 2 (two) times daily., Disp: 20 tablet, Rfl: 1   doxycycline (VIBRA-TABS) 100 MG tablet, Take 1 tablet (100 mg total) by mouth 2 (two) times daily., Disp: 60 tablet, Rfl:  0   Dulaglutide (TRULICITY) 2.63 ZC/5.8IF SOPN, Inject 0.75 mg into the skin once a week. (Patient taking differently: Inject 0.75 mg into the skin every Wednesday.), Disp: 2 mL, Rfl: 2   fenofibrate (TRICOR) 145 MG tablet, Take 1 tablet (145 mg total) by mouth daily., Disp: 90 tablet, Rfl: 1   ferrous sulfate 325 (65 FE) MG EC tablet, Take 1 tablet (325 mg total) by mouth daily with breakfast., Disp: 30 tablet, Rfl: 2   hydrochlorothiazide (HYDRODIURIL) 25 MG tablet, TAKE 1 TABLET (25 MG TOTAL) BY MOUTH DAILY. (Patient taking differently: Take 25 mg by mouth daily.), Disp: 30 tablet, Rfl: 5   Lancets (FREESTYLE) lancets, Use as instructed, Disp: , Rfl:    losartan (COZAAR) 100 MG tablet, Take 1 tablet (100 mg total) by mouth daily., Disp: 90 tablet, Rfl: 1   metFORMIN (GLUCOPHAGE-XR) 500 MG 24 hr tablet, TAKE 2 TABLETS (1,000 MG TOTAL) BY MOUTH 2 (TWO) TIMES DAILY. (Patient taking differently: Take 1,000 mg by mouth 2 (two) times daily.), Disp: 120 tablet, Rfl: 5   omeprazole (PRILOSEC) 40 MG capsule, Take 1 capsule (40 mg total) by mouth at bedtime., Disp: 30 capsule, Rfl: 1   ondansetron (ZOFRAN) 4 MG tablet, Take 1 tablet (4 mg total) by mouth every 8 (  eight) hours as needed for nausea or vomiting., Disp: 20 tablet, Rfl: 0   potassium chloride SA (KLOR-CON) 20 MEQ tablet, Take 1 tablet (20 mEq total) by mouth daily., Disp: 30 tablet, Rfl: 2   prazosin (MINIPRESS) 2 MG capsule, Take 2 mg by mouth at bedtime., Disp: , Rfl:    QUEtiapine (SEROQUEL) 300 MG tablet, Take 150 mg by mouth at bedtime., Disp: , Rfl:    sertraline (ZOLOFT) 100 MG tablet, Take 100 mg by mouth daily., Disp: , Rfl:    Syringe/Needle, Disp, 30G X 1/2" 1 ML MISC, UAD - use as directed, Disp: 100 each, Rfl: 5   vitamin B-12 (CYANOCOBALAMIN) 1000 MCG tablet, Take 1 tablet (1,000 mcg total) by mouth daily., Disp: 30 tablet, Rfl: 0  Social History   Tobacco Use  Smoking Status Never  Smokeless Tobacco Never    Allergies   Allergen Reactions   Heparin Other (See Comments)    HIT Ab negative on 02/20/15, but SRA POSITIVE    Oxytetracycline Rash and Other (See Comments)   Del-Mycin [Erythromycin]     All mycin drugs - unknown reaction   Other     Mycins unknown reaction   Sulfa Antibiotics     Unknown reaction   Sulfonamide Derivatives     Unknown reaction   Objective:   There were no vitals filed for this visit. There is no height or weight on file to calculate BMI. Constitutional Well developed. Well nourished.  Vascular Dorsalis pedis pulses faintly palpable bilaterally. Posterior tibial pulses faintly palpable bilaterally. Capillary refill normal to all digits.  No cyanosis or clubbing noted. Pedal hair growth normal.  Neurologic Normal speech. Oriented to person, place, and time. Protective sensation absent  Dermatologic Wound Location: Left heel ulceration probing down to deep tissue Wound Base: Mixed Granular/Fibrotic Peri-wound: Reddened Exudate: Scant/small amount Serosanguinous exudate Wound Measurements: -See below  Orthopedic: No pain to palpation either foot.   Radiographs: 3 views of skeletally mature the left foot: Some debris noted however no nail noted.  Soft tissue defect noted to the heel.  No signs of osteomyelitis noted.  No cortical destruction noted. Assessment:   1. Heel ulcer, left, with fat layer exposed ([place])   2. Diabetic polyneuropathy associated with type 2 diabetes mellitus ([place])      Plan:  Patient was evaluated and treated and all questions answered.  Ulcer left heel ulceration probing down to deep tissues/capsule with underlying concern for osteomyelitis -Debridement as below. -Dressed with Betadine wet-to-dry, DSD. -Continue off-loading with cam boot -Doxycycline was dispensed for skin and soft tissue prophylaxis I plan on keeping him on doxycycline until resolvent of the wound. -Given the location of the wound patient is a high risk of major  amputation if the wound further regresses.  Given his A1c of 8.5 and the difficulty of healing the wound I discussed with the patient that he is at high risk of amputation.  He states understanding. -ABIs were reviewed with the patient.  Angiogram was reviewed with the patient at this time no intervention was indicated as patient does not have any proximal segment disease. -MRI was reviewed of the heel which shows already signs for osteomyelitis.  However clinically the wound has decreased in depth and I am no longer able to probe down to bone and it continues to decrease.  At this time I discussed with the patient that we can continue doing local wound care as opposed to undergoing surgery for debridement and partial calcanectomy.  Patient states understanding and would like to continue with local wound care.  However I have a low threshold to take him to the operating room.  He states understanding.  Procedure: Excisional Debridement of Wound~stagnant Tool: Sharp chisel blade/tissue nipper Rationale: Removal of non-viable soft tissue from the wound to promote healing.  Anesthesia: none Pre-Debridement Wound Measurements: 1 cm x 1.2 cm x 0.3 cm  Post-Debridement Wound Measurements: 1.1 x 1.3 x 0.3 cm  Type of Debridement: Sharp Excisional Tissue Removed: Non-viable soft tissue Blood loss: Minimal (<50cc) Depth of Debridement: Deep tissue/capsule Technique: Sharp excisional debridement to bleeding, viable wound base.  Wound Progress: The size and the depth continues to decrease. Dressing: Dry, sterile, compression dressing. Disposition: Patient tolerated procedure well. Patient to return in 1 week for follow-up.  No follow-ups on file.

## 2020-08-17 ENCOUNTER — Ambulatory Visit: Payer: Medicare Other | Admitting: Podiatry

## 2020-09-10 ENCOUNTER — Ambulatory Visit: Payer: Medicare Other | Admitting: Podiatry

## 2020-09-16 NOTE — Progress Notes (Signed)
Patient ID: [name] [name], male    DOB: Apr 24, 1960  MRN: [id number]  CC: Hypertension and Diabetes Follow-Up  Subjective: [name] [name] is a 60 y.o. male who presents for hypertension and diabetes follow-up.   His concerns today include:   HYPERTENSION FOLLOW-UP: 06/19/2020 per DO note: BP near goal. Asymptomatic. Continue current regimen.  09/19/2020: Doing well on current regimen. No issues/concerns. Has not taken Losartan for the day as of present, plans to take once returning home.   2. DIABETES TYPE 2 FOLLOW-UP: 05/10/2020 per DO note: Last A1c obtained during hospitalization was 8.5, previously 7.1 at this office in Jan 2022. Will need separate visit to focus on diabetic control. Discussed with patient that wound healing can be impacted by poor glucose control. Continue current regimen. Emphasized diet.   09/19/2020: Doing well on current regimen. No issues/concerns.  3. LEFT HEEL ULCER: Seeing Podiatry. 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 Requesting refill of Doxycycline.   Patient Active Problem List   Diagnosis Date Noted   Diabetic foot infection ([place]) 04/26/2020   Penetrating wound of left foot    Skin lesion 12/07/2019   Chronic pain disorder 09/13/2019   DDD (degenerative disc disease), lumbar 09/13/2019   Dyslipidemia 09/13/2019   Obesity 09/13/2019   Peripheral neuropathy 09/13/2019   Sleep apnea    Prolonged Q-T interval on ECG 12/03/2016   Drug-induced torsades de pointes ([place]) 12/03/2016   Thrombocytopenia ([place]) 11/29/2016   Personal history of ECMO 11/26/2016   Personal history of spine surgery 11/26/2016   S/P rotator cuff repair 11/26/2016   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 11/13/2016   Mild concentric left ventricular hypertrophy (LVH) 09/11/2016   DJD (degenerative joint disease) of cervical spine 02/26/2016   Tinea pedis of both feet 06/03/2015    Pre-ulcerative calluses 06/01/2015   Type 2 diabetes mellitus with diabetic polyneuropathy ([place]) 06/09/2014   Generalized anxiety disorder 06/19/2012   Herpes zoster 12/05/2008   Mixed hyperlipidemia 05/11/2008   DEPRESSION 05/11/2008   Essential hypertension 05/11/2008   GERD 05/11/2008   OSTEOARTHRITIS 05/11/2008   LOW BACK PAIN 05/11/2008   COLONIC POLYPS, HX OF 05/11/2008     Current Outpatient Medications on File Prior to Visit  Medication Sig Dispense Refill   atorvastatin (LIPITOR) 40 MG tablet Take 1 tablet (40 mg total) by mouth daily. 90 tablet 1   buprenorphine (SUBUTEX) 8 MG SUBL SL tablet Place 8 mg under the tongue every 8 (eight) hours.     Dulaglutide (TRULICITY) A999333 0000000 SOPN Inject 0.75 mg into the skin once a week. (Patient taking differently: Inject 0.75 mg into the skin every Wednesday.) 2 mL 2   fenofibrate (TRICOR) 145 MG tablet Take 1 tablet (145 mg total) by mouth daily. 90 tablet 1   ferrous sulfate 325 (65 FE) MG EC tablet Take 1 tablet (325 mg total) by mouth daily with breakfast. 30 tablet 2   hydrochlorothiazide (HYDRODIURIL) 25 MG tablet TAKE 1 TABLET (25 MG TOTAL) BY MOUTH DAILY. (Patient taking differently: Take 25 mg by mouth daily.) 30 tablet 5   Lancets (FREESTYLE) lancets Use as instructed     losartan (COZAAR) 100 MG tablet Take 1 tablet (100 mg total) by mouth daily. 90 tablet 1   omeprazole (PRILOSEC) 40 MG capsule Take 1 capsule (40 mg total) by mouth at bedtime. 30 capsule 1   ondansetron (ZOFRAN) 4 MG tablet Take 1  tablet (4 mg total) by mouth every 8 (eight) hours as needed for nausea or vomiting. 20 tablet 0   potassium chloride SA (KLOR-CON) 20 MEQ tablet Take 1 tablet (20 mEq total) by mouth daily. 30 tablet 2   prazosin (MINIPRESS) 2 MG capsule Take 2 mg by mouth at bedtime.     QUEtiapine (SEROQUEL) 300 MG tablet Take 150 mg by mouth at bedtime.     sertraline (ZOLOFT) 100 MG tablet Take 100 mg by mouth daily.     Syringe/Needle,  Disp, 30G X 1/2" 1 ML MISC UAD - use as directed 100 each 5   vitamin B-12 (CYANOCOBALAMIN) 1000 MCG tablet Take 1 tablet (1,000 mcg total) by mouth daily. 30 tablet 0   No current facility-administered medications on file prior to visit.    Allergies  Allergen Reactions   Heparin Other (See Comments)    HIT Ab negative on 02/20/15, but SRA POSITIVE    Oxytetracycline Rash and Other (See Comments)   Del-Mycin [Erythromycin]     All mycin drugs - unknown reaction   Other     Mycins unknown reaction   Sulfa Antibiotics     Unknown reaction   Sulfonamide Derivatives     Unknown reaction    Social History   Socioeconomic History   Marital status: Divorced    Spouse name: Not on file   Number of children: 3   Years of education: Not on file   Highest education level: Not on file  Occupational History   Occupation: Disabled  Tobacco Use   Smoking status: Never   Smokeless tobacco: Never  Vaping Use   Vaping Use: Never used  Substance and Sexual Activity   Alcohol use: Not Currently   Drug use: No   Sexual activity: Not Currently  Other Topics Concern   Not on file  Social History Narrative   ** Merged History Encounter **       Social Determinants of Health   Financial Resource Strain: Not on file  Food Insecurity: Not on file  Transportation Needs: Not on file  Physical Activity: Not on file  Stress: Not on file  Social Connections: Not on file  Intimate Partner Violence: Not on file    Family History  Problem Relation Age of Onset   Diabetes Mother    Hyperlipidemia Mother    Heart disease Father    Colon cancer Neg Hx    Colon polyps Neg Hx    Esophageal cancer Neg Hx    Rectal cancer Neg Hx    Stomach cancer Neg Hx     Past Surgical History:  Procedure Laterality Date   COLONOSCOPY     EXTRACORPOREAL CIRCULATION  11/2015   FOR 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 Prior to VT arrest: normal. EF 55-60%. Gr 1 DD.  mild LVH. Mildly dilated Aortic Root.;; b.  Normal LV size and function.  Mild LVH.  EF 55%.  No RWMA.  No valve abnormalities.    ROS: Review of Systems Negative except as stated above  PHYSICAL EXAM: BP (!) 147/87   Pulse (!) 57   Temp 98.3 F (36.8 C)   Resp 18   Ht 6' 0.64" (1.845 m)   Wt 234 lb 3.2 oz (106.2 kg)   SpO2 96%   BMI 31.21 kg/m   Physical Exam HENT:     Head: Normocephalic and atraumatic.  Eyes:     Extraocular Movements: Extraocular movements intact.     Conjunctiva/sclera: Conjunctivae normal.     Pupils: Pupils are equal, round, and reactive to light.  Cardiovascular:     Rate and Rhythm: Bradycardia present.     Pulses: Normal pulses.     Heart sounds: Normal heart sounds.  Pulmonary:     Effort: Pulmonary effort is normal.  Musculoskeletal:     Cervical back: Normal range of motion and neck supple.  Neurological:     General: No focal deficit present.     Mental Status: He is alert and oriented to person, place, and time.  Psychiatric:        Mood and Affect: Mood normal.        Behavior: Behavior normal.    Results for orders placed or performed in visit on 09/19/20  POCT glycosylated hemoglobin (Hb A1C)  Result Value Ref Range   Hemoglobin A1C 9.1 (A) 4.0 - 5.6 %   HbA1c POC (<> result, manual entry)     HbA1c, POC (prediabetic range)     HbA1c, POC (controlled diabetic  range)      ASSESSMENT AND PLAN: 1. Essential hypertension: - Blood pressure not at goal during today's visit. Patient asymptomatic without chest pressure, chest pain, palpitations, shortness of breath, and worst headache of life. - Patient has not taken Losartan for the day as of present. Counseled to take once returning home, patient agreeable. - Continue Hydrochlorothiazide and Losartan as prescribed.  - Counseled on blood pressure goal of less than 130/80, low-sodium, DASH diet, medication compliance, 150 minutes of moderate intensity exercise per week as tolerated. Discussed medication compliance, adverse effects. - Follow-up with primary provider in 2 to 4 weeks or sooner if needed for blood pressure check. - hydrochlorothiazide (HYDRODIURIL) 25 MG tablet; Take 1 tablet (25 mg total) by mouth daily.  Dispense: 90 tablet; Refill: 0 - losartan (COZAAR) 100 MG tablet; Take 1 tablet (100 mg total) by mouth daily.  Dispense: 90 tablet; Refill: 0  2. Type 2 diabetes mellitus with diabetic polyneuropathy, unspecified whether long term insulin use ([place]): - Hemoglobin A1c today not at goal at 9.1%, goal < 7%. This is increased from previous hemoglobin A1c of 8.5% on 04/26/2020. - Continue Metformin as prescribed.  - Begin Dapagliflozin Propanediol as prescribed.  - Discussed the importance of healthy eating habits, low-carbohydrate diet, low-sugar diet, regular aerobic exercise (at least 150 minutes a week as tolerated) and medication compliance to achieve or maintain control of diabetes. - Follow-up with primary provider in 4 weeks or sooner if needed.  - POCT glycosylated hemoglobin (Hb A1C) - dapagliflozin propanediol (FARXIGA) 5 MG TABS tablet; Take 1 tablet (5 mg total) by mouth daily before breakfast.  Dispense: 90 tablet; Refill: 0 - metFORMIN (GLUCOPHAGE-XR) 500 MG 24 hr tablet; Take 1 tablet (500 mg total) by mouth 2 (two) times daily.  Dispense: 180 tablet; Refill: 0  3. Heel ulceration,  left,  with unspecified severity ([place]): 4. Diabetic polyneuropathy associated with type 2 diabetes mellitus ([place]): - Continue Doxycycline as prescribed.  - Keep all appointments scheduled with Podiatry.  - doxycycline (VIBRA-TABS) 100 MG tablet; Take 1 tablet (100 mg total) by mouth 2 (two) times daily.  Dispense: 20 tablet; Refill: 1   Patient was given the opportunity to ask questions.  Patient verbalized understanding of the plan and was able to repeat key elements of the plan. Patient was given clear instructions to go to Emergency Department or return to medical center if symptoms don't improve, worsen, or new problems develop.The patient verbalized understanding.   Orders Placed This Encounter  Procedures   POCT glycosylated hemoglobin (Hb A1C)     Requested Prescriptions   Signed Prescriptions Disp Refills   dapagliflozin propanediol (FARXIGA) 5 MG TABS tablet 90 tablet 0    Sig: Take 1 tablet (5 mg total) by mouth daily before breakfast.   metFORMIN (GLUCOPHAGE-XR) 500 MG 24 hr tablet 180 tablet 0    Sig: Take 1 tablet (500 mg total) by mouth 2 (two) times daily.   doxycycline (VIBRA-TABS) 100 MG tablet 20 tablet 1    Sig: Take 1 tablet (100 mg total) by mouth 2 (two) times daily.    Return in about 4 weeks (around 10/17/2020) for Follow-Up or next available diabetes .  Camillia Herter, NP

## 2020-09-18 ENCOUNTER — Other Ambulatory Visit: Payer: Self-pay

## 2020-09-19 ENCOUNTER — Encounter: Payer: Self-pay | Admitting: Family

## 2020-09-19 ENCOUNTER — Ambulatory Visit (INDEPENDENT_AMBULATORY_CARE_PROVIDER_SITE_OTHER): Payer: Medicare Other | Admitting: Family

## 2020-09-19 ENCOUNTER — Other Ambulatory Visit: Payer: Self-pay

## 2020-09-19 VITALS — BP 147/87 | HR 57 | Temp 98.3°F | Resp 18 | Ht 72.64 in | Wt 234.2 lb

## 2020-09-19 DIAGNOSIS — E1142 Type 2 diabetes mellitus with diabetic polyneuropathy: Secondary | ICD-10-CM

## 2020-09-19 DIAGNOSIS — L97429 Non-pressure chronic ulcer of left heel and midfoot with unspecified severity: Secondary | ICD-10-CM

## 2020-09-19 DIAGNOSIS — I10 Essential (primary) hypertension: Secondary | ICD-10-CM | POA: Diagnosis not present

## 2020-09-19 LAB — POCT GLYCOSYLATED HEMOGLOBIN (HGB A1C): Hemoglobin A1C: 9.1 % — AB (ref 4.0–5.6)

## 2020-09-19 MED ORDER — LOSARTAN POTASSIUM 100 MG PO TABS
100.0000 mg | ORAL_TABLET | Freq: Every day | ORAL | 0 refills | Status: DC
Start: 2020-09-19 — End: 2020-12-21

## 2020-09-19 MED ORDER — HYDROCHLOROTHIAZIDE 25 MG PO TABS: 25.0000 mg | ORAL_TABLET | Freq: Every day | ORAL | 0 refills | Status: DC

## 2020-09-19 MED ORDER — DAPAGLIFLOZIN PROPANEDIOL 5 MG PO TABS: 5.0000 mg | ORAL_TABLET | Freq: Every day | ORAL | 0 refills | Status: AC

## 2020-09-19 MED ORDER — METFORMIN HCL ER 500 MG PO TB24
500.0000 mg | ORAL_TABLET | Freq: Two times a day (BID) | ORAL | 0 refills | Status: DC
Start: 2020-09-19 — End: 2020-12-06

## 2020-09-19 MED ORDER — DOXYCYCLINE HYCLATE 100 MG PO TABS: 100.0000 mg | ORAL_TABLET | Freq: Two times a day (BID) | ORAL | 1 refills | Status: DC

## 2020-09-19 NOTE — Progress Notes (Signed)
Pt presents for hypertension and diabetes follow-up pt reports needs refill on metformin and doxycycline

## 2020-09-19 NOTE — Progress Notes (Signed)
Diabetes discussed in office.

## 2020-09-28 ENCOUNTER — Ambulatory Visit: Payer: Medicare Other | Admitting: Podiatry

## 2020-09-28 ENCOUNTER — Other Ambulatory Visit: Payer: Self-pay

## 2020-09-28 DIAGNOSIS — L97422 Non-pressure chronic ulcer of left heel and midfoot with fat layer exposed: Secondary | ICD-10-CM | POA: Diagnosis not present

## 2020-09-28 DIAGNOSIS — E1142 Type 2 diabetes mellitus with diabetic polyneuropathy: Secondary | ICD-10-CM | POA: Diagnosis not present

## 2020-09-28 NOTE — Progress Notes (Signed)
Subjective:  Patient ID: [name] [name], male    DOB: 22-May-1960,  MRN: [id number]  Chief Complaint  Patient presents with   Foot Ulcer    Left heel ulcer     60 y.o. male presents for wound care.  Patient presents with follow-up to left heel ulceration.  He states is doing a lot better.  He has been doing Betadine wet-to-dry dressing the wound has completely healed.   Review of Systems: Negative except as noted in the HPI. Denies N/V/F/Ch.  Past Medical History:  Diagnosis Date   Anxiety    Arthritis    Bipolar 1 disorder ([place])    Blood transfusion without reported diagnosis    Cataract    Chronic pain    Depressed bipolar disorder ([place])    Depression    Diabetes mellitus without complication ([place])    Diabetic neuropathy ([place])    GERD (gastroesophageal reflux disease)    Herpes zoster 12/05/2008   Qualifier: Diagnosis of  By: Ronnald Ramp MD, Arvid Right.    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  Coupled with high doses of loperamide used for pain control.  Unintentional overdose -intractable VT leading to cardiogenic shock - ECMO   Hyperlipidemia    Hypertension    Neuromuscular disorder ([place])    nerve damage back, neck, and shoulder   Neuropathy in diabetes ([place])    Sleep apnea    has CPAP but cannot use it   Substance abuse ([place])    in past     Current Outpatient Medications:    atorvastatin (LIPITOR) 40 MG tablet, Take 1 tablet (40 mg total) by mouth daily., Disp: 90 tablet, Rfl: 1   buprenorphine (SUBUTEX) 8 MG SUBL SL tablet, Place 8 mg under the tongue every 8 (eight) hours., Disp: , Rfl:    dapagliflozin propanediol (FARXIGA) 5 MG TABS tablet, Take 1 tablet (5 mg total) by mouth daily before breakfast., Disp: 90 tablet, Rfl: 0   doxycycline (VIBRA-TABS) 100 MG tablet, Take 1 tablet (100 mg total) by mouth 2 (two) times daily., Disp: 20 tablet, Rfl: 1   Dulaglutide (TRULICITY)  A999333 0000000 SOPN, Inject 0.75 mg into the skin once a week. (Patient taking differently: Inject 0.75 mg into the skin every Wednesday.), Disp: 2 mL, Rfl: 2   fenofibrate (TRICOR) 145 MG tablet, Take 1 tablet (145 mg total) by mouth daily., Disp: 90 tablet, Rfl: 1   ferrous sulfate 325 (65 FE) MG EC tablet, Take 1 tablet (325 mg total) by mouth daily with breakfast., Disp: 30 tablet, Rfl: 2   hydrochlorothiazide (HYDRODIURIL) 25 MG tablet, Take 1 tablet (25 mg total) by mouth daily., Disp: 90 tablet, Rfl: 0   Lancets (FREESTYLE) lancets, Use as instructed, Disp: , Rfl:    losartan (COZAAR) 100 MG tablet, Take 1 tablet (100 mg total) by mouth daily., Disp: 90 tablet, Rfl: 0   metFORMIN (GLUCOPHAGE-XR) 500 MG 24 hr tablet, Take 1 tablet (500 mg total) by mouth 2 (two) times daily., Disp: 180 tablet, Rfl: 0   omeprazole (PRILOSEC) 40 MG capsule, Take 1 capsule (40 mg total) by mouth at bedtime., Disp: 30 capsule, Rfl: 1   ondansetron (ZOFRAN) 4 MG tablet, Take 1 tablet (4 mg total) by mouth every 8 (eight) hours as needed for nausea or vomiting., Disp: 20 tablet, Rfl: 0   potassium chloride SA (KLOR-CON) 20 MEQ tablet, Take 1 tablet (20 mEq  total) by mouth daily., Disp: 30 tablet, Rfl: 2   prazosin (MINIPRESS) 2 MG capsule, Take 2 mg by mouth at bedtime., Disp: , Rfl:    QUEtiapine (SEROQUEL) 300 MG tablet, Take 150 mg by mouth at bedtime., Disp: , Rfl:    sertraline (ZOLOFT) 100 MG tablet, Take 100 mg by mouth daily., Disp: , Rfl:    Syringe/Needle, Disp, 30G X 1/2" 1 ML MISC, UAD - use as directed, Disp: 100 each, Rfl: 5   vitamin B-12 (CYANOCOBALAMIN) 1000 MCG tablet, Take 1 tablet (1,000 mcg total) by mouth daily., Disp: 30 tablet, Rfl: 0  Social History   Tobacco Use  Smoking Status Never  Smokeless Tobacco Never    Allergies  Allergen Reactions   Heparin Other (See Comments)    HIT Ab negative on 02/20/15, but SRA POSITIVE    Oxytetracycline Rash and Other (See Comments)   Del-Mycin  [Erythromycin]     All mycin drugs - unknown reaction   Other     Mycins unknown reaction   Sulfa Antibiotics     Unknown reaction   Sulfonamide Derivatives     Unknown reaction   Objective:   There were no vitals filed for this visit. There is no height or weight on file to calculate BMI. Constitutional Well developed. Well nourished.  Vascular Dorsalis pedis pulses faintly palpable bilaterally. Posterior tibial pulses faintly palpable bilaterally. Capillary refill normal to all digits.  No cyanosis or clubbing noted. Pedal hair growth normal.  Neurologic Normal speech. Oriented to person, place, and time. Protective sensation absent  Dermatologic Left heel ulceration has completely epithelialized.  No clinical signs of dehiscence no infection noted.  No further ulceration noted.  Orthopedic: No pain to palpation either foot.   Radiographs: 3 views of skeletally mature the left foot: Some debris noted however no nail noted.  Soft tissue defect noted to the heel.  No signs of osteomyelitis noted.  No cortical destruction noted. Assessment:   1. Heel ulcer, left, with fat layer exposed ([place])   2. Diabetic polyneuropathy associated with type 2 diabetes mellitus ([place])       Plan:  Patient was evaluated and treated and all questions answered.  Ulcer left heel ulceration probing down to deep tissues/capsule with underlying concern for osteomyelitis -Clinically healed.  At this time I discussed patient the importance of shoe gear modification and glucose control to prevent 3 ulceration.  Patient states understanding and if any foot and ankle issues arises have asked him to come see me.  He states understanding. -ABIs were reviewed with the patient.  Angiogram was reviewed with the patient at this time no intervention was indicated as patient does not have any proximal segment disease. -   No follow-ups on file.

## 2020-10-09 NOTE — Telephone Encounter (Signed)
error 

## 2020-10-17 ENCOUNTER — Ambulatory Visit: Payer: Medicare Other | Admitting: Family Medicine

## 2020-10-17 NOTE — Progress Notes (Signed)
Patient ID: [name] [name], male    DOB: September 02, 1960  MRN: [id number]  CC: Hypertension Follow-Up   Subjective: [name] [name] is a 60 y.o. male who presents for hypertension follow-up.   His concerns today include:   HYPERTENSION FOLLOW-UP: 09/19/2020: - Continue Hydrochlorothiazide and Losartan as prescribed.   10/22/2020: Doing well on current regimen. No side effects. No issues/concerns. Denies chest pain and shortness of breath. Reports not monitoring blood pressure at home.   2. DIABETES TYPE 2 FOLLOW-UP: 09/19/2020: - Continue Metformin as prescribed.  - Begin Dapagliflozin Propanediol as prescribed.   10/22/2020: Doing well on current regimen. No issues/concerns. Reports has not taken Trulicity in some time related to financial concerns.  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 11/13/2016   Mild concentric left ventricular hypertrophy (LVH) 09/11/2016   DJD (degenerative joint disease) of cervical spine 02/26/2016   Tinea pedis of both feet 06/03/2015   Pre-ulcerative calluses 06/01/2015   Type 2 diabetes mellitus with diabetic polyneuropathy ([place]) 06/09/2014   Generalized anxiety disorder 06/19/2012   Herpes zoster 12/05/2008   Mixed hyperlipidemia 05/11/2008   DEPRESSION 05/11/2008   Essential hypertension 05/11/2008   GERD 05/11/2008    OSTEOARTHRITIS 05/11/2008   LOW BACK PAIN 05/11/2008   COLONIC POLYPS, HX OF 05/11/2008     Current Outpatient Medications on File Prior to Visit  Medication Sig Dispense Refill   atorvastatin (LIPITOR) 40 MG tablet Take 1 tablet (40 mg total) by mouth daily. 90 tablet 1   buprenorphine (SUBUTEX) 8 MG SUBL SL tablet Place 8 mg under the tongue every 8 (eight) hours.     dapagliflozin propanediol (FARXIGA) 5 MG TABS tablet Take 1 tablet (5 mg total) by mouth daily before breakfast. 90 tablet 0   Dulaglutide (TRULICITY) 1.88 CZ/6.6AY SOPN Inject 0.75 mg into the skin once a week. (Patient taking differently: Inject 0.75 mg into the skin every Wednesday.) 2 mL 2   fenofibrate (TRICOR) 145 MG tablet Take 1 tablet (145 mg total) by mouth daily. 90 tablet 1   hydrochlorothiazide (HYDRODIURIL) 25 MG tablet Take 1 tablet (25 mg total) by mouth daily. 90 tablet 0   Lancets (FREESTYLE) lancets Use as instructed     losartan (COZAAR) 100 MG tablet Take 1 tablet (100 mg total) by mouth daily. 90 tablet 0   metFORMIN (GLUCOPHAGE-XR) 500 MG 24 hr tablet Take 1 tablet (500 mg total) by mouth 2 (two) times daily. 180 tablet 0   omeprazole (PRILOSEC) 40 MG capsule Take 1 capsule (40 mg total) by mouth at bedtime. 30 capsule 1   ondansetron (ZOFRAN) 4 MG tablet Take 1 tablet (4 mg total) by mouth every 8 (eight) hours as needed for nausea or vomiting. 20 tablet 0   potassium chloride SA (KLOR-CON) 20 MEQ tablet Take 1 tablet (20 mEq total) by mouth daily. 30 tablet 2  prazosin (MINIPRESS) 2 MG capsule Take 2 mg by mouth at bedtime.     QUEtiapine (SEROQUEL) 300 MG tablet Take 150 mg by mouth at bedtime.     sertraline (ZOLOFT) 100 MG tablet Take 100 mg by mouth daily.     Syringe/Needle, Disp, 30G X 1/2" 1 ML MISC UAD - use as directed 100 each 5   vitamin B-12 (CYANOCOBALAMIN) 1000 MCG tablet Take 1 tablet (1,000 mcg total) by mouth daily. 30 tablet 0   ferrous sulfate 325 (65 FE) MG EC tablet Take 1 tablet  (325 mg total) by mouth daily with breakfast. 30 tablet 2   No current facility-administered medications on file prior to visit.    Allergies  Allergen Reactions   Heparin Other (See Comments)    HIT Ab negative on 02/20/15, but SRA POSITIVE    Oxytetracycline Rash and Other (See Comments)   Del-Mycin [Erythromycin]     All mycin drugs - unknown reaction   Other     Mycins unknown reaction   Sulfa Antibiotics     Unknown reaction   Sulfonamide Derivatives     Unknown reaction    Social History   Socioeconomic History   Marital status: Divorced    Spouse name: Not on file   Number of children: 3   Years of education: Not on file   Highest education level: Not on file  Occupational History   Occupation: Disabled  Tobacco Use   Smoking status: Never   Smokeless tobacco: Never  Vaping Use   Vaping Use: Never used  Substance and Sexual Activity   Alcohol use: Not Currently   Drug use: No   Sexual activity: Not Currently  Other Topics Concern   Not on file  Social History Narrative   ** Merged History Encounter **       Social Determinants of Health   Financial Resource Strain: Not on file  Food Insecurity: Not on file  Transportation Needs: Not on file  Physical Activity: Not on file  Stress: Not on file  Social Connections: Not on file  Intimate Partner Violence: Not on file    Family History  Problem Relation Age of Onset   Diabetes Mother    Hyperlipidemia Mother    Heart disease Father    Colon cancer Neg Hx    Colon polyps Neg Hx    Esophageal cancer Neg Hx    Rectal cancer Neg Hx    Stomach cancer Neg Hx     Past Surgical History:  Procedure Laterality Date   COLONOSCOPY     EXTRACORPOREAL CIRCULATION  11/2015   FOR Cardiogenic shock related to intractable [place] VT storm (prolonged QT from drug toxixcity)    INTRAOPERATIVE TRANSESOPHAGEAL ECHOCARDIOGRAM N/A 11/26/2016   Procedure: INTRAOPERATIVE TRANSESOPHAGEAL ECHOCARDIOGRAM;  Surgeon: Ivin Poot, MD;  Location: [facility];  Service: Open Heart Surgery;  Laterality: N/A;   IR ANGIOGRAM EXTREMITY LEFT  06/05/2020   IR PTA NON CORO-LOWER EXTREM  06/05/2020   IR RADIOLOGIST EVAL & MGMT  05/21/2020   IR US GUIDE VASC ACCESS LEFT  06/05/2020   POLYPECTOMY     SHOULDER ARTHROSCOPY WITH ROTATOR CUFF REPAIR Right 2009   SPINE SURGERY  2010   TRANSTHORACIC ECHOCARDIOGRAM  07/2016; 12/10/2016   a. Prior to VT arrest: normal. EF 55-60%. Gr 1 DD.  mild LVH. Mildly dilated Aortic Root.;; b.  Normal LV size and function.  Mild LVH.  EF 55%.  No RWMA.  No valve  abnormalities.    ROS: Review of Systems Negative except as stated above  PHYSICAL EXAM: BP 136/76 (BP Location: Right Arm, Patient Position: Sitting, Cuff Size: Large)   Pulse (!) 54   Temp 98.2 F (36.8 C) (Oral)   Resp 16   Ht 6\' 2"  (1.88 m)   Wt 242 lb (109.8 kg)   SpO2 96%   BMI 31.07 kg/m   Physical Exam HENT:     Head: Normocephalic and atraumatic.  Eyes:     Extraocular Movements: Extraocular movements intact.     Conjunctiva/sclera: Conjunctivae normal.     Pupils: Pupils are equal, round, and reactive to light.  Cardiovascular:     Rate and Rhythm: Bradycardia present.     Pulses: Normal pulses.     Heart sounds: Normal heart sounds.  Pulmonary:     Effort: Pulmonary effort is normal.     Breath sounds: Normal breath sounds.  Musculoskeletal:     Cervical back: Normal range of motion and neck supple.  Neurological:     General: No focal deficit present.     Mental Status: He is alert and oriented to person, place, and time.  Psychiatric:        Mood and Affect: Mood normal.        Behavior: Behavior normal.    ASSESSMENT AND PLAN: 1. Essential hypertension: - Continue Hydrochlorothiazide and Losartan as prescribed. No refill needed.  - Counseled on blood pressure goal of less than 130/80, low-sodium, DASH diet, medication compliance, 150 minutes of moderate intensity exercise per week as tolerated.  Discussed medication compliance, adverse effects. - Follow-up with primary provider in 3 months or sooner if needed.   2. Type 2 diabetes mellitus with diabetic polyneuropathy, unspecified whether long term insulin use ([place]): - Continue Metformin an Dapagliflozin Propanediol as prescribed. No refills needed.  - Discussed the importance of healthy eating habits, low-carbohydrate diet, low-sugar diet, regular aerobic exercise (at least 150 minutes a week as tolerated) and medication compliance to achieve or maintain control of diabetes. - Follow-up with primary provider in 8 weeks or sooner if needed for repeat hemoglobin A1c and diabetes follow-up.  3. Need for immunization against influenza: - Administered today in office. - Flu Vaccine QUAD 11mo+IM (Fluarix, Fluzone & Alfiuria Quad PF)   Patient was given the opportunity to ask questions.  Patient verbalized understanding of the plan and was able to repeat key elements of the plan. Patient was given clear instructions to go to Emergency Department or return to medical center if symptoms don't improve, worsen, or new problems develop.The patient verbalized understanding.   Orders Placed This Encounter  Procedures   Flu Vaccine QUAD 70mo+IM (Fluarix, Fluzone & Alfiuria Quad PF)    Return in about 3 months (around 01/22/2021) for Follow-Up or next available hypertension, 8 weeks diabetes, physical per patient preference.  Camillia Herter, NP

## 2020-10-22 ENCOUNTER — Encounter: Payer: Self-pay | Admitting: Family

## 2020-10-22 ENCOUNTER — Other Ambulatory Visit: Payer: Self-pay

## 2020-10-22 ENCOUNTER — Ambulatory Visit (INDEPENDENT_AMBULATORY_CARE_PROVIDER_SITE_OTHER): Payer: Medicare Other | Admitting: Family

## 2020-10-22 VITALS — BP 136/76 | HR 54 | Temp 98.2°F | Resp 16 | Ht 74.0 in | Wt 242.0 lb

## 2020-10-22 DIAGNOSIS — Z23 Encounter for immunization: Secondary | ICD-10-CM

## 2020-10-22 DIAGNOSIS — I10 Essential (primary) hypertension: Secondary | ICD-10-CM

## 2020-10-22 DIAGNOSIS — E1142 Type 2 diabetes mellitus with diabetic polyneuropathy: Secondary | ICD-10-CM

## 2020-12-06 ENCOUNTER — Emergency Department (HOSPITAL_COMMUNITY)
Admission: EM | Admit: 2020-12-06 | Discharge: 2020-12-06 | Disposition: A | Discharge status: Home / Self Care | Payer: Medicare Other | Attending: Emergency Medicine | Admitting: Emergency Medicine

## 2020-12-06 ENCOUNTER — Emergency Department (HOSPITAL_COMMUNITY): Payer: Medicare Other

## 2020-12-06 DIAGNOSIS — M7989 Other specified soft tissue disorders: Secondary | ICD-10-CM | POA: Diagnosis not present

## 2020-12-06 DIAGNOSIS — E1142 Type 2 diabetes mellitus with diabetic polyneuropathy: Secondary | ICD-10-CM | POA: Insufficient documentation

## 2020-12-06 DIAGNOSIS — I10 Essential (primary) hypertension: Secondary | ICD-10-CM | POA: Insufficient documentation

## 2020-12-06 DIAGNOSIS — R2243 Localized swelling, mass and lump, lower limb, bilateral: Secondary | ICD-10-CM | POA: Diagnosis present

## 2020-12-06 DIAGNOSIS — Z794 Long term (current) use of insulin: Secondary | ICD-10-CM | POA: Insufficient documentation

## 2020-12-06 DIAGNOSIS — R0602 Shortness of breath: Secondary | ICD-10-CM | POA: Insufficient documentation

## 2020-12-06 DIAGNOSIS — R0600 Dyspnea, unspecified: Secondary | ICD-10-CM | POA: Diagnosis not present

## 2020-12-06 DIAGNOSIS — R600 Localized edema: Secondary | ICD-10-CM | POA: Diagnosis not present

## 2020-12-06 DIAGNOSIS — Z7984 Long term (current) use of oral hypoglycemic drugs: Secondary | ICD-10-CM | POA: Insufficient documentation

## 2020-12-06 DIAGNOSIS — Z79899 Other long term (current) drug therapy: Secondary | ICD-10-CM | POA: Insufficient documentation

## 2020-12-06 LAB — CBC WITH DIFFERENTIAL/PLATELET
Abs Immature Granulocytes: 0.03 10*3/uL (ref 0.00–0.07)
Basophils Absolute: 0.1 10*3/uL (ref 0.0–0.1)
Basophils Relative: 1 %
Eosinophils Absolute: 0.2 10*3/uL (ref 0.0–0.5)
Eosinophils Relative: 3 %
HCT: 38 % — ABNORMAL LOW (ref 39.0–52.0)
Hemoglobin: 13.3 g/dL (ref 13.0–17.0)
Immature Granulocytes: 1 %
Lymphocytes Relative: 20 %
Lymphs Abs: 1.1 10*3/uL (ref 0.7–4.0)
MCH: 27.8 pg (ref 26.0–34.0)
MCHC: 35 g/dL (ref 30.0–36.0)
MCV: 79.5 fL — ABNORMAL LOW (ref 80.0–100.0)
Monocytes Absolute: 0.3 10*3/uL (ref 0.1–1.0)
Monocytes Relative: 6 %
Neutro Abs: 3.7 10*3/uL (ref 1.7–7.7)
Neutrophils Relative %: 69 %
Platelets: 140 10*3/uL — ABNORMAL LOW (ref 150–400)
RBC: 4.78 MIL/uL (ref 4.22–5.81)
RDW: 13.3 % (ref 11.5–15.5)
WBC: 5.4 10*3/uL (ref 4.0–10.5)
nRBC: 0 % (ref 0.0–0.2)

## 2020-12-06 LAB — COMPREHENSIVE METABOLIC PANEL
ALT: 27 U/L (ref 0–44)
AST: 33 U/L (ref 15–41)
Albumin: 3.8 g/dL (ref 3.5–5.0)
Alkaline Phosphatase: 48 U/L (ref 38–126)
Anion gap: 12 (ref 5–15)
BUN: 18 mg/dL (ref 6–20)
CO2: 23 mmol/L (ref 22–32)
Calcium: 9.1 mg/dL (ref 8.9–10.3)
Chloride: 101 mmol/L (ref 98–111)
Creatinine, Ser: 1.05 mg/dL (ref 0.61–1.24)
GFR, Estimated: >60 mL/min (ref >60)
Glucose, Bld: 276 mg/dL — ABNORMAL HIGH (ref 70–99)
Potassium: 3.8 mmol/L (ref 3.5–5.1)
Sodium: 136 mmol/L (ref 135–145)
Total Bilirubin: 0.6 mg/dL (ref 0.3–1.2)
Total Protein: 6.7 g/dL (ref 6.5–8.1)

## 2020-12-06 LAB — TROPONIN I (HIGH SENSITIVITY)
Troponin I (High Sensitivity): 10 ng/L (ref <18)
Troponin I (High Sensitivity): 14 ng/L (ref <18)

## 2020-12-06 LAB — CBG MONITORING, ED: Glucose-Capillary: 302 mg/dL — ABNORMAL HIGH (ref 70–99)

## 2020-12-06 LAB — BRAIN NATRIURETIC PEPTIDE: B Natriuretic Peptide: 77.1 pg/mL (ref 0.0–100.0)

## 2020-12-06 MED ORDER — FUROSEMIDE 20 MG PO TABS
40.0000 mg | ORAL_TABLET | Freq: Once | ORAL | Status: AC
  Administered 2020-12-06: 40 mg via ORAL
  Filled 2020-12-06: qty 2

## 2020-12-06 MED ORDER — ONDANSETRON 4 MG PO TBDP
4.0000 mg | ORAL_TABLET | Freq: Once | ORAL | Status: AC
  Administered 2020-12-06: 4 mg via ORAL
  Filled 2020-12-06: qty 1

## 2020-12-06 MED ORDER — METFORMIN HCL 500 MG PO TABS
500.0000 mg | ORAL_TABLET | Freq: Once | ORAL | Status: AC
  Administered 2020-12-06: 500 mg via ORAL
  Filled 2020-12-06: qty 1

## 2020-12-06 MED ORDER — FUROSEMIDE 20 MG PO TABS: 20.0000 mg | ORAL_TABLET | Freq: Every day | ORAL | 0 refills | Status: DC

## 2020-12-06 MED ORDER — HYDROCHLOROTHIAZIDE 25 MG PO TABS
25.0000 mg | ORAL_TABLET | Freq: Once | ORAL | Status: AC
  Administered 2020-12-06: 25 mg via ORAL
  Filled 2020-12-06: qty 1

## 2020-12-06 MED ORDER — METFORMIN HCL ER 500 MG PO TB24: 500.0000 mg | ORAL_TABLET | Freq: Two times a day (BID) | ORAL | 0 refills | Status: DC

## 2020-12-06 MED ORDER — PRAZOSIN HCL 2 MG PO CAPS
2.0000 mg | ORAL_CAPSULE | Freq: Once | ORAL | Status: DC
  Filled 2020-12-06: qty 1

## 2020-12-06 NOTE — ED Triage Notes (Signed)
Reports shortness of breath. Hx of CHF. Also has worsening BLE swelling.

## 2020-12-06 NOTE — Discharge Instructions (Addendum)
If you develop new or worsening shortness of breath, cough, fever, chest pain, or any other new/concerning symptoms then return to the ER for evaluation.

## 2020-12-06 NOTE — ED Provider Notes (Signed)
[facility] EMERGENCY DEPARTMENT Provider Note   CSN: [id number] Arrival date & time: 12/06/20  1627     History Chief Complaint  Patient presents with   Shortness of Breath    [name] [name] is a 60 y.o. male.  HPI 60 year old male presents with shortness of breath and leg swelling. His legs have been swollen for about 1 and a half weeks. He has been laying/sleeping in a recliner with his legs hanging down, which he thinks has caused this. Both legs are swollen.  Today he was watching TV sitting on the couch and developed acute shortness of breath.  This was about 1 hour prior to arrival.  Resolved shortly after getting to the ER.  He feels like part of this might be stress as he has been dealing with some deaths in the family over the last few months.  He has been drinking 1/5 of liquor each day for about a week.  He denies any chest pain.  Past Medical History:  Diagnosis Date   Anxiety    Arthritis    Bipolar 1 disorder ([place])    Blood transfusion without reported diagnosis    Cataract    Chronic pain    Depressed bipolar disorder ([place])    Depression    Diabetes mellitus without complication ([place])    Diabetic neuropathy ([place])    GERD (gastroesophageal reflux disease)    Herpes zoster 12/05/2008   Qualifier: Diagnosis of  By: Ronnald Ramp MD, Arvid Right.    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  Coupled with high doses of loperamide used for pain control.  Unintentional overdose -intractable VT leading to cardiogenic shock - ECMO   Hyperlipidemia    Hypertension    Neuromuscular disorder ([place])    nerve damage back, neck, and shoulder   Neuropathy in diabetes ([place])    Sleep apnea    has CPAP but cannot use it   Substance abuse ([place])    in past     Patient Active Problem List   Diagnosis Date Noted   Diabetic foot infection ([place]) 04/26/2020   Penetrating wound of  left foot    Skin lesion 12/07/2019   Chronic pain disorder 09/13/2019   DDD (degenerative disc disease), lumbar 09/13/2019   Dyslipidemia 09/13/2019   Obesity 09/13/2019   Peripheral neuropathy 09/13/2019   Sleep apnea    Prolonged Q-T interval on ECG 12/03/2016   Drug-induced torsades de pointes 12/03/2016   Thrombocytopenia ([place]) 11/29/2016   Personal history of ECMO 11/26/2016   Personal history of spine surgery 11/26/2016   S/P rotator cuff repair 11/26/2016   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 11/13/2016   Mild concentric left ventricular hypertrophy (LVH) 09/11/2016   DJD (degenerative joint disease) of cervical spine 02/26/2016   Tinea pedis of both feet 06/03/2015   Pre-ulcerative calluses 06/01/2015   Type 2 diabetes mellitus with diabetic polyneuropathy ([place]) 06/09/2014   Generalized anxiety disorder 06/19/2012   Herpes zoster 12/05/2008   Mixed hyperlipidemia 05/11/2008   DEPRESSION 05/11/2008   Essential hypertension 05/11/2008   GERD 05/11/2008   OSTEOARTHRITIS 05/11/2008   LOW BACK PAIN 05/11/2008   COLONIC POLYPS, HX OF 05/11/2008    Past Surgical History:  Procedure Laterality Date   COLONOSCOPY     EXTRACORPOREAL CIRCULATION  11/2015   FOR 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 Prior to VT arrest: normal. EF 55-60%. Gr 1 DD.  mild LVH.  Mildly dilated Aortic Root.;; b.  Normal LV size and function.  Mild LVH.  EF 55%.  No RWMA.  No valve abnormalities.       Family History  Problem Relation Age of Onset   Diabetes Mother    Hyperlipidemia Mother    Heart disease Father    Colon cancer Neg Hx    Colon polyps Neg Hx    Esophageal cancer Neg Hx    Rectal cancer Neg Hx    Stomach cancer Neg Hx     Social History   Tobacco Use   Smoking status: Never   Smokeless tobacco: Never  Vaping Use   Vaping Use: Never used  Substance Use Topics   Alcohol use: Not Currently   Drug use: No    Home Medications Prior to Admission medications   Medication Sig Start Date End Date Taking? Authorizing Provider  furosemide (LASIX) 20 MG tablet Take 1 tablet (20 mg total) by mouth daily for 7 days. 12/06/20 12/13/20 Yes Sherwood Gambler, MD  atorvastatin (LIPITOR) 40 MG tablet Take 1 tablet (40 mg total) by mouth daily. 06/19/20   Nicolette Bang, MD  buprenorphine (SUBUTEX) 8 MG SUBL SL tablet Place 8 mg under the tongue every 8 (eight) hours.    [provider]  dapagliflozin propanediol (FARXIGA) 5 MG TABS tablet Take 1 tablet (5 mg total) by mouth daily before breakfast. 09/19/20 12/18/20  Camillia Herter, NP  Dulaglutide (TRULICITY) 8.88 KC/0.0LK SOPN Inject 0.75 mg into the skin once a week. Patient taking differently: Inject 0.75 mg into the skin every Wednesday. 11/09/19   Argentina Donovan, PA-C  fenofibrate (TRICOR) 145 MG tablet Take 1 tablet (145 mg total) by mouth daily. 06/19/20   Nicolette Bang, MD  ferrous sulfate 325 (65 FE) MG EC tablet Take 1 tablet (325 mg total) by mouth daily with breakfast. 06/20/20 07/20/20  Nicolette Bang, MD  hydrochlorothiazide (HYDRODIURIL) 25 MG tablet Take 1 tablet (25 mg total) by mouth daily. 09/19/20 12/18/20  Camillia Herter, NP  Lancets (FREESTYLE) lancets Use as instructed 05/24/14   [provider]  losartan (COZAAR) 100 MG tablet Take 1 tablet  (100 mg total) by mouth daily. 09/19/20   Camillia Herter, NP  metFORMIN (GLUCOPHAGE-XR) 500 MG 24 hr tablet Take 1 tablet (500 mg total) by mouth 2 (two) times daily. 12/06/20 01/05/21  Sherwood Gambler, MD  omeprazole (PRILOSEC) 40 MG capsule Take 1 capsule (40 mg total) by mouth at bedtime. 06/19/20   Nicolette Bang, MD  ondansetron (ZOFRAN) 4 MG tablet Take 1 tablet (4 mg total) by mouth every 8 (eight) hours as needed for nausea or vomiting. 04/24/20   Felipa Furnace, DPM  potassium chloride SA (KLOR-CON) 20 MEQ tablet Take 1 tablet (20 mEq total)  by mouth daily. 06/20/20   Nicolette Bang, MD  prazosin (MINIPRESS) 2 MG capsule Take 2 mg by mouth at bedtime.    [provider]  QUEtiapine (SEROQUEL) 300 MG tablet Take 150 mg by mouth at bedtime.    [provider]  sertraline (ZOLOFT) 100 MG tablet Take 100 mg by mouth daily. 04/09/20   [provider]  Syringe/Needle, Disp, 30G X 1/2" 1 ML MISC UAD - use as directed 12/29/18   Ladell Pier, MD  vitamin B-12 (CYANOCOBALAMIN) 1000 MCG tablet Take 1 tablet (1,000 mcg total) by mouth daily. 04/28/20   Florencia Reasons, MD    Allergies    Heparin, Oxytetracycline, Del-mycin [erythromycin], Other, Sulfa antibiotics, and Sulfonamide derivatives  Review of Systems   Review of Systems  Constitutional:  Negative for fever.  Respiratory:  Positive for shortness of breath. Negative for cough.   Cardiovascular:  Positive for leg swelling. Negative for chest pain.  Gastrointestinal:  Negative for abdominal pain.  All other systems reviewed and are negative.  Physical Exam Updated Vital Signs BP (!) 185/83   Pulse 66   Temp 99.2 F (37.3 C) (Oral)   Resp 14   Ht 6\' 2"  (1.88 m)   Wt 112.5 kg   SpO2 97%   BMI 31.84 kg/m   Physical Exam Vitals and nursing note reviewed.  Constitutional:      Appearance: He is well-developed.  HENT:     Head: Normocephalic and atraumatic.     Right Ear: External ear  normal.     Left Ear: External ear normal.     Nose: Nose normal.  Eyes:     General:        Right eye: No discharge.        Left eye: No discharge.  Cardiovascular:     Rate and Rhythm: Normal rate and regular rhythm.     Heart sounds: Normal heart sounds.  Pulmonary:     Effort: Pulmonary effort is normal.     Breath sounds: Normal breath sounds. No wheezing, rhonchi or rales.  Abdominal:     Palpations: Abdomen is soft.     Tenderness: There is no abdominal tenderness.  Musculoskeletal:     Cervical back: Neck supple.     Right lower leg: Edema present.     Left lower leg: Edema present.     Comments: Pitting edema bilaterally from ankles to just inferior to knees. Bilateral redness c/w stasis dermatitis.  Skin:    General: Skin is warm and dry.  Neurological:     Mental Status: He is alert.  Psychiatric:        Mood and Affect: Mood is not anxious.    ED Results / Procedures / Treatments   Labs (all labs ordered are listed, but only abnormal results are displayed) Labs Reviewed  COMPREHENSIVE METABOLIC PANEL - Abnormal; Notable for the following components:      Result Value   Glucose, Bld 276 (*)    All other components within normal limits  CBC WITH DIFFERENTIAL/PLATELET - Abnormal; Notable for the following components:   HCT 38.0 (*)    MCV 79.5 (*)    Platelets 140 (*)    All other components within normal limits  CBG MONITORING, ED - Abnormal; Notable for the following components:   Glucose-Capillary 302 (*)    All other components within normal limits  BRAIN NATRIURETIC PEPTIDE  TROPONIN I (HIGH SENSITIVITY)  TROPONIN I (HIGH SENSITIVITY)  EKG EKG Interpretation  Date/Time:  Thursday December 06 2020 16:35:28 EST Ventricular Rate:  69 PR Interval:  187 QRS Duration: 97 QT Interval:  424 QTC Calculation: 455 R Axis:   2 Text Interpretation: Sinus rhythm nonspecific ST changes similar to April 2022 Confirmed by Sherwood Gambler [phone]) on  12/06/2020 4:41:13 PM  Radiology DG Chest 2 View  Result Date: 12/06/2020 CLINICAL DATA:  Dyspnea.  Leg swelling.  History of CHF. EXAM: CHEST - 2 VIEW COMPARISON:  April 27, 2020 FINDINGS: The heart size and mediastinal contours are within normal limits. Both lungs are clear. The visualized skeletal structures are unremarkable. IMPRESSION: No overt edema or pulmonary venous congestion. No cause for the patient's shortness of breath identified. Vascular crowding in the medial right lung base. Electronically Signed   By: Dorise Bullion III M.D.   On: 12/06/2020 17:16    Procedures Procedures   Medications Ordered in ED Medications  prazosin (MINIPRESS) capsule 2 mg (2 mg Oral Patient Refused/Not Given 12/06/20 1732)  hydrochlorothiazide (HYDRODIURIL) tablet 25 mg (25 mg Oral Given 12/06/20 1731)  metFORMIN (GLUCOPHAGE) tablet 500 mg (500 mg Oral Given 12/06/20 1731)  ondansetron (ZOFRAN-ODT) disintegrating tablet 4 mg (4 mg Oral Given 12/06/20 1844)  furosemide (LASIX) tablet 40 mg (40 mg Oral Given 12/06/20 1946)  ondansetron (ZOFRAN-ODT) disintegrating tablet 4 mg (4 mg Oral Given 12/06/20 1947)    ED Course  I have reviewed the triage vital signs and the nursing notes.  Pertinent labs & imaging results that were available during my care of the patient were reviewed by me and considered in my medical decision making (see chart for details).    MDM Rules/Calculators/A&P                           Patient had transient shortness of breath that has not recurred.  Due to this troponins were obtained and are negative x2 though slightly increasing between the first and second.  However given lower suspicion for ACS in this presentation I think it is reasonable to discharge and have him follow-up with PCP.  He does have some lower extremity edema and has previously been on diuretic so we will put on a short course of furosemide.  We will also refill his metformin.  He otherwise appears  stable for discharge home.  I have low suspicion for acute infection, DVT, PE.  Given return precautions. Final Clinical Impression(s) / ED Diagnoses Final diagnoses:  Bilateral lower extremity edema    Rx / DC Orders ED Discharge Orders          Ordered    metFORMIN (GLUCOPHAGE-XR) 500 MG 24 hr tablet  2 times daily        12/06/20 2127    furosemide (LASIX) 20 MG tablet  Daily        12/06/20 2127             Sherwood Gambler, MD 12/06/20 2134

## 2020-12-21 ENCOUNTER — Other Ambulatory Visit: Payer: Self-pay | Admitting: Family

## 2020-12-21 DIAGNOSIS — I10 Essential (primary) hypertension: Secondary | ICD-10-CM

## 2020-12-25 ENCOUNTER — Encounter: Payer: Medicare Other | Admitting: Family

## 2020-12-25 DIAGNOSIS — I10 Essential (primary) hypertension: Secondary | ICD-10-CM

## 2020-12-25 DIAGNOSIS — Z1211 Encounter for screening for malignant neoplasm of colon: Secondary | ICD-10-CM

## 2020-12-25 DIAGNOSIS — E782 Mixed hyperlipidemia: Secondary | ICD-10-CM

## 2020-12-25 DIAGNOSIS — D509 Iron deficiency anemia, unspecified: Secondary | ICD-10-CM

## 2020-12-25 DIAGNOSIS — Z1329 Encounter for screening for other suspected endocrine disorder: Secondary | ICD-10-CM

## 2020-12-25 DIAGNOSIS — Z0000 Encounter for general adult medical examination without abnormal findings: Secondary | ICD-10-CM

## 2020-12-25 DIAGNOSIS — E1142 Type 2 diabetes mellitus with diabetic polyneuropathy: Secondary | ICD-10-CM

## 2020-12-25 DIAGNOSIS — Z13228 Encounter for screening for other metabolic disorders: Secondary | ICD-10-CM

## 2020-12-25 NOTE — Progress Notes (Signed)
Erroneous encounter

## 2021-01-23 ENCOUNTER — Ambulatory Visit: Payer: Medicare Other | Admitting: Family

## 2021-01-24 NOTE — Progress Notes (Signed)
Subjective:   [name] [name] is a 61 y.o. male who presents for Medicare Annual/Subsequent preventive examination.  HYPERTENSION FOLLOW-UP: 10/22/2020: - Continue Hydrochlorothiazide and Losartan as prescribed.  01/25/2021: Reports not taking medication as he should stating "It's hit or miss." The last time he took blood pressure medication was 3 days ago.   2. DIABETES FOLLOW-UP: 10/22/2020: - Continue Metformin an Dapagliflozin Propanediol as prescribed.   01/25/2021: Reports not taking medication as he should. Since previous appointment was unable to get [place] related to financial concerns.   3. GENERALIZED ANXIETY DISORDER FOLLOW-UP: 4. BIPOLAR: 5. ALCOHOL DEPENDENCE: Patient reports his mother passed away 11-11-20 and prior to that his stepfather passed away 2020/08/11. He was their caregiver. Initially able cope. However, within the last 2.5 months drinking a fifth of liquor daily. Reports he doesn't want to drink liquor. States he has to drink liquor or he will feel sick with nausea. Reports taking Seroquel for bipolar.   6. CHRONIC PAIN: Reports was seeing a pain management doctor. Reports it has been 2 years since he was able to see the doctor in-person related to the pandemic. Reports 2 weeks ago he was called by the same doctor and told that he is no longer practicing medicine. Patient reports he has tried to establish with a new pain management doctor but was told a referral is required. Reports was taking several medications one of which was Buprenorphine.  [place] DISCHARGE FOLLOW-UP: 12/06/2020 at [facility] Emergency Department per MD note: Patient had transient shortness of breath that has not recurred.  Due to this troponins were obtained and are negative x2 though slightly increasing between the first and second.  However given lower suspicion for ACS in this presentation I think it is reasonable to discharge and have him follow-up with PCP.  He  does have some lower extremity edema and has previously been on diuretic so we will put on a short course of furosemide.  We will also refill his metformin.  He otherwise appears stable for discharge home.  I have low suspicion for acute infection, DVT, PE.  Given return precautions.  01/25/2021: Reports still having swelling of bilateral lower extremities. Denies pain, tenderness, and additional red flag symptoms.   Depression screen [facility] 2/9 01/25/2021 10/22/2020 09/19/2020 06/19/2020 06/19/2020  Decreased Interest 0 0 0 0 0  Down, Depressed, Hopeless 0 0 0 0 0  PHQ - 2 Score 0 0 0 0 0  Altered sleeping - 0 - 0 0  Tired, decreased energy - 1 - 0 0  Change in appetite - 0 - 0 0  Feeling bad or failure about yourself  - 0 - 0 0  Trouble concentrating - 1 - 0 0  Moving slowly or fidgety/restless - 0 - 0 0  Suicidal thoughts - 0 - 0 0  PHQ-9 Score - 2 - 0 0  Some recent data might be hidden    Review of Systems:  See above.   Objective:    Today's Vitals   01/25/21 0827 01/25/21 0857  BP: (!) 185/91 (!) 160/69  Pulse: 68   Resp: 18   Temp: 98.3 F (36.8 C)   SpO2: 94%   Weight: 245 lb (111.1 kg)   Height: 6' 0.64" (1.845 m)   PainSc: 0-No pain    Body mass index is 32.65 kg/m.  Physical Exam HENT:     Head: Normocephalic and atraumatic.  Eyes:     Extraocular Movements: Extraocular  movements intact.     Conjunctiva/sclera: Conjunctivae normal.     Pupils: Pupils are equal, round, and reactive to light.  Cardiovascular:     Rate and Rhythm: Normal rate and regular rhythm.     Pulses: Normal pulses.     Heart sounds: Normal heart sounds.  Pulmonary:     Effort: Pulmonary effort is normal.     Breath sounds: Normal breath sounds.  Musculoskeletal:     Cervical back: Normal range of motion and neck supple.     Right lower leg: Edema present.     Left lower leg: Edema present.  Skin:    General: Skin is warm and dry.  Neurological:     General: No focal deficit present.      Mental Status: He is alert and oriented to person, place, and time.  Psychiatric:        Mood and Affect: Mood normal.        Behavior: Behavior normal.   Diabetic foot exam was performed with the following findings:   No deformities, ulcerations, or other skin breakdown Intact posterior tibialis and dorsalis pedis pulses Decreased sensation to monofilament testing bilaterally.      Advanced Directives 12/06/2020 04/26/2020 01/02/2020 12/28/2019 01/30/2019 08/23/2018 08/18/2018  Does Patient Have a Medical Advance Directive? No No No No No No No  Would patient like information on creating a medical advance directive? - Yes (ED - Information included in AVS) - No - Patient declined No - Patient declined No - Patient declined -  Some encounter information is confidential and restricted. Go to Review Flowsheets activity to see all data.    Current Medications (verified) Outpatient Encounter Medications as of 01/25/2021  Medication Sig   atorvastatin (LIPITOR) 40 MG tablet Take 1 tablet (40 mg total) by mouth daily.   buprenorphine (SUBUTEX) 8 MG SUBL SL tablet Place 8 mg under the tongue every 8 (eight) hours.   Dulaglutide (TRULICITY) 2.83 TD/1.7OH SOPN Inject 0.75 mg into the skin once a week.   fenofibrate (TRICOR) 145 MG tablet Take 1 tablet (145 mg total) by mouth daily.   ferrous sulfate 325 (65 FE) MG EC tablet Take 1 tablet (325 mg total) by mouth daily with breakfast.   furosemide (LASIX) 20 MG tablet Take 1 tablet (20 mg total) by mouth daily for 7 days.   hydrochlorothiazide (HYDRODIURIL) 25 MG tablet TAKE 1 TABLET(25 MG) BY MOUTH DAILY   Lancets (FREESTYLE) lancets Use as instructed   losartan (COZAAR) 100 MG tablet TAKE 1 TABLET(100 MG) BY MOUTH DAILY   metFORMIN (GLUCOPHAGE-XR) 500 MG 24 hr tablet Take 2 tablets (1,000 mg total) by mouth 2 (two) times daily.   omeprazole (PRILOSEC) 40 MG capsule Take 1 capsule (40 mg total) by mouth at bedtime.   ondansetron (ZOFRAN) 4 MG  tablet Take 1 tablet (4 mg total) by mouth every 8 (eight) hours as needed for nausea or vomiting.   potassium chloride SA (KLOR-CON) 20 MEQ tablet Take 1 tablet (20 mEq total) by mouth daily.   prazosin (MINIPRESS) 2 MG capsule Take 2 mg by mouth at bedtime.   QUEtiapine (SEROQUEL) 300 MG tablet Take 150 mg by mouth at bedtime.   sertraline (ZOLOFT) 100 MG tablet Take 100 mg by mouth daily.   Syringe/Needle, Disp, 30G X 1/2" 1 ML MISC UAD - use as directed   vitamin B-12 (CYANOCOBALAMIN) 1000 MCG tablet Take 1 tablet (1,000 mcg total) by mouth daily.   [DISCONTINUED] Dulaglutide (TRULICITY) 6.07 PX/1.0GY  SOPN Inject 0.75 mg into the skin once a week. (Patient taking differently: Inject 0.75 mg into the skin every Wednesday.)   [DISCONTINUED] furosemide (LASIX) 20 MG tablet Take 1 tablet (20 mg total) by mouth daily for 7 days.   [DISCONTINUED] hydrochlorothiazide (HYDRODIURIL) 25 MG tablet TAKE 1 TABLET(25 MG) BY MOUTH DAILY   [DISCONTINUED] losartan (COZAAR) 100 MG tablet TAKE 1 TABLET(100 MG) BY MOUTH DAILY   [DISCONTINUED] metFORMIN (GLUCOPHAGE-XR) 500 MG 24 hr tablet Take 1 tablet (500 mg total) by mouth 2 (two) times daily.   No facility-administered encounter medications on file as of 01/25/2021.    Allergies (verified) Heparin, Oxytetracycline, Del-mycin [erythromycin], Other, Sulfa antibiotics, and Sulfonamide derivatives   History: Past Medical History:  Diagnosis Date   Anxiety    Arthritis    Bipolar 1 disorder ([place])    Blood transfusion without reported diagnosis    Cataract    Chronic pain    Depressed bipolar disorder ([place])    Depression    Diabetes mellitus without complication ([place])    Diabetic neuropathy ([place])    GERD (gastroesophageal reflux disease)    Herpes zoster 12/05/2008   Qualifier: Diagnosis of  By: Ronnald Ramp MD, Arvid Right.    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   Coupled with high doses of loperamide used for pain control.  Unintentional overdose -intractable VT leading to cardiogenic shock - ECMO   Hyperlipidemia    Hypertension    Neuromuscular disorder ([place])    nerve damage back, neck, and shoulder   Neuropathy in diabetes ([place])    Sleep apnea    has CPAP but cannot use it   Substance abuse ([place])    in past    Past Surgical History:  Procedure Laterality Date   COLONOSCOPY     EXTRACORPOREAL CIRCULATION  11/2015   FOR Cardiogenic shock related to intractable [place] VT storm (prolonged QT from drug toxixcity)    INTRAOPERATIVE TRANSESOPHAGEAL ECHOCARDIOGRAM N/A 11/26/2016   Procedure: INTRAOPERATIVE TRANSESOPHAGEAL ECHOCARDIOGRAM;  Surgeon: Ivin Poot, MD;  Location: [facility];  Service: Open Heart Surgery;  Laterality: N/A;   IR ANGIOGRAM EXTREMITY LEFT  06/05/2020   IR PTA NON CORO-LOWER EXTREM  06/05/2020   IR RADIOLOGIST EVAL & MGMT  05/21/2020   IR US GUIDE VASC ACCESS LEFT  06/05/2020   POLYPECTOMY     SHOULDER ARTHROSCOPY WITH ROTATOR CUFF REPAIR Right 2009   SPINE SURGERY  2010   TRANSTHORACIC ECHOCARDIOGRAM  07/2016; 12/10/2016   a. Prior to VT arrest: normal. EF 55-60%. Gr 1 DD.  mild LVH. Mildly dilated Aortic Root.;; b.  Normal LV size and function.  Mild LVH.  EF 55%.  No RWMA.  No valve abnormalities.   Family History  Problem Relation Age of Onset   Diabetes Mother    Hyperlipidemia Mother    Heart disease Father    Colon cancer Neg Hx    Colon polyps Neg Hx    Esophageal cancer Neg Hx    Rectal cancer Neg Hx    Stomach cancer Neg Hx    Social History   Socioeconomic History   Marital status: Divorced    Spouse name: Not on file   Number of children: 3   Years of education: Not on file   Highest education level: Not on file  Occupational History   Occupation: Disabled  Tobacco Use   Smoking status: Never  Smokeless tobacco: Never  Vaping Use   Vaping Use: Never used  Substance and Sexual Activity    Alcohol use: Not Currently   Drug use: No   Sexual activity: Not Currently  Other Topics Concern   Not on file  Social History Narrative   ** Merged History Encounter **       Social Determinants of Health   Financial Resource Strain: Not on file  Food Insecurity: Not on file  Transportation Needs: Not on file  Physical Activity: Not on file  Stress: Not on file  Social Connections: Not on file    Tobacco Counseling Never smoked.   Clinical Intake:  Pre-visit preparation completed: Yes  Pain : 0-10 Pain Score: 0-No pain Diabetes: Yes CBG done?: No Did pt. bring in CBG monitor from home?: No Diabetic? Yes   Interpreter Needed?: No  Activities of Daily Living In your present state of health, do you have any difficulty performing the following activities: 06/05/2020 04/27/2020  Hearing? N N  Vision? N N  Difficulty concentrating or making decisions? N N  Walking or climbing stairs? N Y  Dressing or bathing? N N  Doing errands, shopping? - N  Some recent data might be hidden    Patient Care Team: Durene Fruits, NP as PCP - General (Family Medicine) Leonie Man, MD as PCP - Cardiology (Cardiology)  Indicate any recent Medical Services you may have received from other than [facility] providers in the past year (date may be approximate). None.    Assessment:   This is a routine wellness examination for Chas.  Hearing/Vision screen: None   Dietary issues and exercise activities discussed: Counseled on low-sodium, DASH diet, and 150 minutes of moderate intensity exercise per week as tolerated.   Goals Addressed: "I would like to get my diabetes under control and lose some weight."  Depression Screen PHQ 2/9 Scores 01/25/2021 10/22/2020 09/19/2020 06/19/2020 06/19/2020 05/10/2020 02/13/2020  PHQ - 2 Score 0 0 0 0 0 0 0  PHQ- 9 Score - 2 - 0 0 - 0    Fall Risk Fall Risk  05/10/2020 02/13/2020 02/03/2017 11/04/2016 09/24/2016  Falls in the past year? 1 0 Yes No Yes   Number falls in past yr: 1 0 2 or more - 1  Injury with Fall? 0 0 No - No  Risk Factor Category  - - High Fall Risk - -  Risk for fall due to : - - Impaired balance/gait - Impaired balance/gait;Impaired mobility  Risk for fall due to: Comment - - - - -  Follow up - - - - -  Comments:  Reports over the last few weeks fell twice in the same day going up the stairs at his home. Thinks falls are related to neuropathy. Scraped right knee. Did not seek medical evaluation.   FALL RISK PREVENTION PERTAINING TO THE HOME: Any stairs in or around the home? Yes  If so, are there any without handrails? No  Home free of loose throw rugs in walkways, pet beds, electrical cords, etc? Yes  Adequate lighting in your home to reduce risk of falls? Yes   ASSISTIVE DEVICES UTILIZED TO PREVENT FALLS: Life alert? No  Use of a cane, walker or w/c? No  Grab bars in the bathroom? No  Shower chair or bench in shower? No  Elevated toilet seat or a handicapped toilet? No   TIMED UP AND GO: Was the test performed? Yes .  Length of time to ambulate 10  feet: 12 sec.   Gait slow and steady without use of assistive device  Cognitive Function: 6CIT Screen 01/25/2021  What Year? 0 points  What month? 0 points  What time? 3 points  Count back from 20 2 points  Months in reverse 4 points  Repeat phrase 2 points  Total Score 11    Immunizations Immunization History  Administered Date(s) Administered   Influenza,inj,Quad PF,6+ Mos 02/23/2015, 09/14/2015, 09/11/2016, 11/26/2017, 09/20/2018, 10/22/2020   Influenza-Unspecified 10/14/2019   Janssen (J&J) SARS-COV-2 Vaccination 08/08/2019   Pneumococcal Polysaccharide-23 05/24/2014, 02/23/2015   Tdap 03/16/2014, 12/24/2019    TDAP status: Up to date  Flu Vaccine status: Up to date  Pneumococcal vaccine status: Completed during today's visit.  Covid-19 vaccine status: Completed vaccines  Qualifies for Shingles Vaccine? Yes   Zostavax completed No    Shingrix Completed?: No.    Education has been provided regarding the importance of this vaccine. Patient has been advised to call insurance company to determine out of pocket expense if they have not yet received this vaccine. Advised may also receive vaccine at local pharmacy or Health Dept. Verbalized acceptance and understanding.  Screening Tests Health Maintenance  Topic Date Due   Zoster Vaccines- Shingrix (1 of 2) Never done   COLONOSCOPY (Pts 45-19yrs Insurance coverage will need to be confirmed)  08/31/2015   Pneumococcal Vaccine 85-65 Years old (2 - PCV) 02/23/2016   OPHTHALMOLOGY EXAM  09/04/2017   COVID-19 Vaccine (2 - Booster for Janssen series) 10/03/2019   HEMOGLOBIN A1C  04/25/2021   FOOT EXAM  07/09/2021   TETANUS/TDAP  12/23/2029   INFLUENZA VACCINE  Completed   Hepatitis C Screening  Completed   HIV Screening  Completed   HPV VACCINES  Aged Out    Health Maintenance  Health Maintenance Due  Topic Date Due   Zoster Vaccines- Shingrix (1 of 2) Never done   COLONOSCOPY (Pts 45-44yrs Insurance coverage will need to be confirmed)  08/31/2015   Pneumococcal Vaccine 17-64 Years old (2 - PCV) 02/23/2016   OPHTHALMOLOGY EXAM  09/04/2017   COVID-19 Vaccine (2 - Booster for Janssen series) 10/03/2019    Colorectal cancer screening: Referral to GI placed 01/25/2021. Pt aware the office will call re: appt.  Lung Cancer Screening: (Low Dose CT Chest recommended if Age 59-80 years, 30 pack-year currently smoking OR have quit w/in 15years.) does not qualify.   Lung Cancer Screening Referral: not applicable  Additional Screening:  Hepatitis C Screening: does not qualify; Completed 06/01/2015.  Vision Screening: Recommended annual ophthalmology exams for early detection of glaucoma and other disorders of the eye. Is the patient up to date with their annual eye exam?  No  Who is the provider or what is the name of the office in which the patient attends annual eye exams? My  Marin Comment, OD at [facility]. If pt is not established with a provider, would they like to be referred to a provider to establish care?  Not applicable .   Dental Screening: Recommended annual dental exams for proper oral hygiene  Results for orders placed or performed in visit on 01/25/21  POCT glycosylated hemoglobin (Hb A1C)  Result Value Ref Range   Hemoglobin A1C 9.6 (A) 4.0 - 5.6 %   HbA1c POC (<> result, manual entry)     HbA1c, POC (prediabetic range)     HbA1c, POC (controlled diabetic range)      Plan:  1. Medicare annual wellness visit, subsequent: - Counseled on 150 minutes  of exercise per week as tolerated, healthy eating (including decreased daily intake of saturated fats, cholesterol, added sugars, sodium), STI prevention, and routine healthcare maintenance.  2. Essential hypertension: - Blood pressure not at goal during today's visit. Patient asymptomatic without chest pressure, chest pain, palpitations, shortness of breath, worst headache of life, and any additional red flag symptoms. - Hydrochlorothiazide and Losartan as prescribed. Patient reports has not been taking as he should.  - Counseled on blood pressure goal of less than 130/80, low-sodium, DASH diet, medication compliance, 150 minutes of moderate intensity exercise per week as tolerated. Discussed medication compliance, adverse effects. - Follow-up with primary provider in 2 weeks or sooner if needed.  - hydrochlorothiazide (HYDRODIURIL) 25 MG tablet; TAKE 1 TABLET(25 MG) BY MOUTH DAILY  Dispense: 90 tablet; Refill: 0 - losartan (COZAAR) 100 MG tablet; TAKE 1 TABLET(100 MG) BY MOUTH DAILY  Dispense: 90 tablet; Refill: 0  3. Type 2 diabetes mellitus with diabetic polyneuropathy, without long-term current use of insulin ([place]): - Hemoglobin A1c not at goal today at 9.6%, goal < 7%.  - Patient reports not taking medications as he should.  - Increase Metformin from 500 mg twice daily to 1000 mg twice daily.  -  Dulaglutide as prescribed.  - Discussed the importance of healthy eating habits, low-carbohydrate diet, low-sugar diet, regular aerobic exercise (at least 150 minutes a week as tolerated) and medication compliance to achieve or maintain control of diabetes. - Referral to Endocrinology for further evaluation and management. - POCT glycosylated hemoglobin (Hb A1C) - Ambulatory referral to Endocrinology - metFORMIN (GLUCOPHAGE-XR) 500 MG 24 hr tablet; Take 2 tablets (1,000 mg total) by mouth 2 (two) times daily.  Dispense: 360 tablet; Refill: 0 - Dulaglutide (TRULICITY) 6.29 BM/8.4XL SOPN; Inject 0.75 mg into the skin once a week.  Dispense: 2 mL; Refill: 2  4. Mixed hyperlipidemia: - Update lipid panel. - Lipid Panel  5. Thyroid disorder screening: - Update thyroid screening.  - TSH  6. Colon cancer screening: - Referral to Gastroenterology for colon cancer screening by colonoscopy. - Ambulatory referral to Gastroenterology  7. Generalized anxiety disorder: 8. History of bipolar disorder: 9. Alcohol dependence with unspecified alcohol-induced disorder [facility]): - Patient denies thoughts of self-harm, suicidal ideations, homicidal ideations. - Referral to Dennison Mascot, LCSW for counseling services and community resources.  - Referral to Psychiatry for further evaluation and management.  - Ambulatory referral to Psychiatry  10. Chronic pain syndrome: - Referral to Pain Clinic for further evaluation and management.  - Ambulatory referral to Pain Clinic  11. Bilateral lower extremity edema: - Chronic.  - Furosemide as prescribed.  - Counseled on conservative measures such as compression stockings and elevating legs while sitting.  - Referral to Vascular Surgery for further evaluation and management.  - furosemide (LASIX) 20 MG tablet; Take 1 tablet (20 mg total) by mouth daily for 7 days.  Dispense: 7 tablet; Refill: 0 - Ambulatory referral to Vascular Surgery  12. Encounter for  diabetic foot exam [facility]): - Completed today in office.   I have personally reviewed and noted the following in the patients chart:   Medical and social history Use of alcohol, tobacco or illicit drugs  Current medications and supplements including opioid prescriptions. Patient is currently taking opioid prescriptions. Information provided to patient regarding non-opioid alternatives. Patient advised to discuss non-opioid treatment plan with their provider. Functional ability and status Nutritional status Physical activity Advanced directives List of other physicians Hospitalizations, surgeries, and ER visits in previous 30  months Vitals Screenings to include cognitive, depression, and falls Referrals and appointments  In addition, I have reviewed and discussed with patient certain preventive protocols, quality metrics, and best practice recommendations. A written personalized care plan for preventive services as well as general preventive health recommendations were provided to patient.    Camillia Herter, NP   01/25/2021

## 2021-01-25 ENCOUNTER — Ambulatory Visit (INDEPENDENT_AMBULATORY_CARE_PROVIDER_SITE_OTHER): Payer: Medicare Other | Admitting: Family

## 2021-01-25 ENCOUNTER — Encounter: Payer: Self-pay | Admitting: Family

## 2021-01-25 ENCOUNTER — Telehealth: Payer: Self-pay | Admitting: Family

## 2021-01-25 ENCOUNTER — Other Ambulatory Visit: Payer: Self-pay

## 2021-01-25 ENCOUNTER — Other Ambulatory Visit: Payer: Self-pay | Admitting: Family

## 2021-01-25 VITALS — BP 160/69 | HR 68 | Temp 98.3°F | Resp 18 | Ht 72.64 in | Wt 245.0 lb

## 2021-01-25 DIAGNOSIS — Z1211 Encounter for screening for malignant neoplasm of colon: Secondary | ICD-10-CM

## 2021-01-25 DIAGNOSIS — Z0001 Encounter for general adult medical examination with abnormal findings: Secondary | ICD-10-CM | POA: Diagnosis not present

## 2021-01-25 DIAGNOSIS — F1029 Alcohol dependence with unspecified alcohol-induced disorder: Secondary | ICD-10-CM

## 2021-01-25 DIAGNOSIS — F411 Generalized anxiety disorder: Secondary | ICD-10-CM | POA: Diagnosis not present

## 2021-01-25 DIAGNOSIS — I10 Essential (primary) hypertension: Secondary | ICD-10-CM | POA: Diagnosis not present

## 2021-01-25 DIAGNOSIS — Z8659 Personal history of other mental and behavioral disorders: Secondary | ICD-10-CM

## 2021-01-25 DIAGNOSIS — G894 Chronic pain syndrome: Secondary | ICD-10-CM

## 2021-01-25 DIAGNOSIS — Z0000 Encounter for general adult medical examination without abnormal findings: Secondary | ICD-10-CM

## 2021-01-25 DIAGNOSIS — E782 Mixed hyperlipidemia: Secondary | ICD-10-CM

## 2021-01-25 DIAGNOSIS — E1142 Type 2 diabetes mellitus with diabetic polyneuropathy: Secondary | ICD-10-CM | POA: Diagnosis not present

## 2021-01-25 DIAGNOSIS — Z1329 Encounter for screening for other suspected endocrine disorder: Secondary | ICD-10-CM

## 2021-01-25 DIAGNOSIS — R600 Localized edema: Secondary | ICD-10-CM | POA: Diagnosis not present

## 2021-01-25 DIAGNOSIS — E119 Type 2 diabetes mellitus without complications: Secondary | ICD-10-CM

## 2021-01-25 LAB — POCT GLYCOSYLATED HEMOGLOBIN (HGB A1C): Hemoglobin A1C: 9.6 % — AB (ref 4.0–5.6)

## 2021-01-25 MED ORDER — TRULICITY 0.75 MG/0.5ML ~~LOC~~ SOAJ
0.7500 mg | SUBCUTANEOUS | 2 refills | Status: DC
  Filled 2021-01-25 – 2021-04-24 (×2): qty 2, 28d supply, fill #0
  Filled 2021-07-02: qty 4, 56d supply, fill #1

## 2021-01-25 MED ORDER — FUROSEMIDE 20 MG PO TABS
20.0000 mg | ORAL_TABLET | Freq: Every day | ORAL | 0 refills | Status: DC
  Filled 2021-01-25 – 2021-04-24 (×2): qty 7, 7d supply, fill #0

## 2021-01-25 MED ORDER — LOSARTAN POTASSIUM 100 MG PO TABS
ORAL_TABLET | ORAL | 0 refills | Status: DC
Start: 2021-01-25 — End: 2021-08-08
  Filled 2021-01-25 – 2021-04-24 (×2): qty 90, 90d supply, fill #0

## 2021-01-25 MED ORDER — HYDROCHLOROTHIAZIDE 25 MG PO TABS
ORAL_TABLET | ORAL | 0 refills | Status: DC
  Filled 2021-01-25 – 2021-04-24 (×2): qty 90, 90d supply, fill #0

## 2021-01-25 MED ORDER — METFORMIN HCL ER 500 MG PO TB24
1000.0000 mg | ORAL_TABLET | Freq: Two times a day (BID) | ORAL | 0 refills | Status: DC
  Filled 2021-01-25 – 2021-04-24 (×2): qty 360, 90d supply, fill #0

## 2021-01-25 NOTE — Progress Notes (Signed)
Subjective:   [name] [name] is a 61 y.o. male who presents for Medicare Annual/Subsequent preventive examination.  Review of Systems    Reviewed by PCP        Objective:    Today's Vitals   01/25/21 0827 01/25/21 0857  BP: (!) 185/91 (!) 160/69  Pulse: 68   Resp: 18   Temp: 98.3 F (36.8 C)   SpO2: 94%   Weight: 245 lb (111.1 kg)   Height: 6' 0.64" (1.845 m)   PainSc: 0-No pain    Body mass index is 32.65 kg/m.  Advanced Directives 12/06/2020 04/26/2020 01/02/2020 12/28/2019 01/30/2019 08/23/2018 08/18/2018  Does Patient Have a Medical Advance Directive? No No No No No No No  Would patient like information on creating a medical advance directive? - Yes (ED - Information included in AVS) - No - Patient declined No - Patient declined No - Patient declined -  Some encounter information is confidential and restricted. Go to Review Flowsheets activity to see all data.    Current Medications (verified) Outpatient Encounter Medications as of 01/25/2021  Medication Sig   atorvastatin (LIPITOR) 40 MG tablet Take 1 tablet (40 mg total) by mouth daily.   buprenorphine (SUBUTEX) 8 MG SUBL SL tablet Place 8 mg under the tongue every 8 (eight) hours.   Dulaglutide (TRULICITY) 0.27 OZ/3.6UY SOPN Inject 0.75 mg into the skin once a week.   fenofibrate (TRICOR) 145 MG tablet Take 1 tablet (145 mg total) by mouth daily.   ferrous sulfate 325 (65 FE) MG EC tablet Take 1 tablet (325 mg total) by mouth daily with breakfast.   furosemide (LASIX) 20 MG tablet Take 1 tablet (20 mg total) by mouth daily for 7 days.   hydrochlorothiazide (HYDRODIURIL) 25 MG tablet TAKE 1 TABLET(25 MG) BY MOUTH DAILY   Lancets (FREESTYLE) lancets Use as instructed   losartan (COZAAR) 100 MG tablet TAKE 1 TABLET(100 MG) BY MOUTH DAILY   metFORMIN (GLUCOPHAGE-XR) 500 MG 24 hr tablet Take 2 tablets (1,000 mg total) by mouth 2 (two) times daily.   omeprazole (PRILOSEC) 40 MG capsule Take 1 capsule (40 mg total) by  mouth at bedtime.   ondansetron (ZOFRAN) 4 MG tablet Take 1 tablet (4 mg total) by mouth every 8 (eight) hours as needed for nausea or vomiting.   potassium chloride SA (KLOR-CON) 20 MEQ tablet Take 1 tablet (20 mEq total) by mouth daily.   prazosin (MINIPRESS) 2 MG capsule Take 2 mg by mouth at bedtime.   QUEtiapine (SEROQUEL) 300 MG tablet Take 150 mg by mouth at bedtime.   sertraline (ZOLOFT) 100 MG tablet Take 100 mg by mouth daily.   Syringe/Needle, Disp, 30G X 1/2" 1 ML MISC UAD - use as directed   vitamin B-12 (CYANOCOBALAMIN) 1000 MCG tablet Take 1 tablet (1,000 mcg total) by mouth daily.   [DISCONTINUED] Dulaglutide (TRULICITY) 4.03 KV/4.2VZ SOPN Inject 0.75 mg into the skin once a week. (Patient taking differently: Inject 0.75 mg into the skin every Wednesday.)   [DISCONTINUED] furosemide (LASIX) 20 MG tablet Take 1 tablet (20 mg total) by mouth daily for 7 days.   [DISCONTINUED] hydrochlorothiazide (HYDRODIURIL) 25 MG tablet TAKE 1 TABLET(25 MG) BY MOUTH DAILY   [DISCONTINUED] losartan (COZAAR) 100 MG tablet TAKE 1 TABLET(100 MG) BY MOUTH DAILY   [DISCONTINUED] metFORMIN (GLUCOPHAGE-XR) 500 MG 24 hr tablet Take 1 tablet (500 mg total) by mouth 2 (two) times daily.   No facility-administered encounter medications on file as of 01/25/2021.  Allergies (verified) Heparin, Oxytetracycline, Del-mycin [erythromycin], Other, Sulfa antibiotics, and Sulfonamide derivatives   History: Past Medical History:  Diagnosis Date   Anxiety    Arthritis    Bipolar 1 disorder ([place])    Blood transfusion without reported diagnosis    Cataract    Chronic pain    Depressed bipolar disorder ([place])    Depression    Diabetes mellitus without complication ([place])    Diabetic neuropathy ([place])    GERD (gastroesophageal reflux disease)    Herpes zoster 12/05/2008   Qualifier: Diagnosis of  By: Ronnald Ramp MD, Arvid Right.    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  Coupled with high doses of loperamide used for pain control.  Unintentional overdose -intractable VT leading to cardiogenic shock - ECMO   Hyperlipidemia    Hypertension    Neuromuscular disorder ([place])    nerve damage back, neck, and shoulder   Neuropathy in diabetes ([place])    Sleep apnea    has CPAP but cannot use it   Substance abuse ([place])    in past    Past Surgical History:  Procedure Laterality Date   COLONOSCOPY     EXTRACORPOREAL CIRCULATION  11/2015   FOR Cardiogenic shock related to intractable [place] VT storm (prolonged QT from drug toxixcity)    INTRAOPERATIVE TRANSESOPHAGEAL ECHOCARDIOGRAM N/A 11/26/2016   Procedure: INTRAOPERATIVE TRANSESOPHAGEAL ECHOCARDIOGRAM;  Surgeon: Ivin Poot, MD;  Location: [facility];  Service: Open Heart Surgery;  Laterality: N/A;   IR ANGIOGRAM EXTREMITY LEFT  06/05/2020   IR PTA NON CORO-LOWER EXTREM  06/05/2020   IR RADIOLOGIST EVAL & MGMT  05/21/2020   IR US GUIDE VASC ACCESS LEFT  06/05/2020   POLYPECTOMY     SHOULDER ARTHROSCOPY WITH ROTATOR CUFF REPAIR Right 2009   SPINE SURGERY  2010   TRANSTHORACIC ECHOCARDIOGRAM  07/2016; 12/10/2016   a. Prior to VT arrest: normal. EF 55-60%. Gr 1 DD.  mild LVH. Mildly dilated Aortic Root.;; b.  Normal LV size and function.  Mild LVH.  EF 55%.  No RWMA.  No valve abnormalities.   Family History  Problem Relation Age of Onset   Diabetes Mother    Hyperlipidemia Mother    Heart disease Father    Colon cancer Neg Hx    Colon polyps Neg Hx    Esophageal cancer Neg Hx    Rectal cancer Neg Hx    Stomach cancer Neg Hx    Social History   Socioeconomic History   Marital status: Divorced    Spouse name: Not on file   Number of children: 3   Years of education: Not on file   Highest education level: Not on file  Occupational History   Occupation: Disabled  Tobacco Use   Smoking status: Never   Smokeless tobacco: Never  Vaping Use   Vaping Use:  Never used  Substance and Sexual Activity   Alcohol use: Not Currently   Drug use: No   Sexual activity: Not Currently  Other Topics Concern   Not on file  Social History Narrative   ** Merged History Encounter **       Social Determinants of Health   Financial Resource Strain: Not on file  Food Insecurity: Not on file  Transportation Needs: Not on file  Physical Activity: Not on file  Stress: Not on file  Social Connections: Not on file    Tobacco  Counseling   Clinical Intake:  Pre-visit preparation completed: Yes  Pain : 0-10 Pain Score: 0-No pain     Diabetes: Yes CBG done?: No Did pt. bring in CBG monitor from home?: No     Diabetic? Yes  Interpreter Needed?: No      Activities of Daily Living In your present state of health, do you have any difficulty performing the following activities: 06/05/2020 04/27/2020  Hearing? N N  Vision? N N  Difficulty concentrating or making decisions? N N  Walking or climbing stairs? N Y  Dressing or bathing? N N  Doing errands, shopping? - N  Some recent data might be hidden    Patient Care Team: Camillia Herter, NP as PCP - General (Nurse Practitioner) Leonie Man, MD as PCP - Cardiology (Cardiology) Nicolette Bang, MD (Family Medicine)  Indicate any recent Medical Services you may have received from other than [facility] providers in the past year (date may be approximate).     Assessment:   This is a routine wellness examination for Kriston.  Hearing/Vision screen No results found.  Dietary issues and exercise activities discussed:    Goals Addressed   None   Depression Screen PHQ 2/9 Scores 01/25/2021 10/22/2020 09/19/2020 06/19/2020 06/19/2020 05/10/2020 02/13/2020  PHQ - 2 Score 6 0 0 0 0 0 0  PHQ- 9 Score 11 2 - 0 0 - 0    Fall Risk Fall Risk  01/25/2021 05/10/2020 02/13/2020 02/03/2017 11/04/2016  Falls in the past year? 1 1 0 Yes No  Number falls in past yr: 0 1 0 2 or more -  Injury with  Fall? 0 0 0 No -  Risk Factor Category  - - - High Fall Risk -  Risk for fall due to : Impaired balance/gait - - Impaired balance/gait -  Risk for fall due to: Comment - - - - -  Follow up Falls evaluation completed - - - -    FALL RISK PREVENTION PERTAINING TO THE HOME:  Any stairs in or around the home? Yes  If so, are there any without handrails? No  Home free of loose throw rugs in walkways, pet beds, electrical cords, etc? Yes  Adequate lighting in your home to reduce risk of falls? Yes    ASSISTIVE DEVICES UTILIZED TO PREVENT FALLS:  Life alert? No  Use of a cane, walker or w/c? No  Grab bars in the bathroom? No  Shower chair or bench in shower? No  Elevated toilet seat or a handicapped toilet? No   TIMED UP AND GO:  Was the test performed? No .  Length of time to ambulate 10 feet: N/A sec.   Gait slow and steady without use of assistive device  Cognitive Function:     6CIT Screen 01/25/2021  What Year? 0 points  What month? 0 points  What time? 3 points  Count back from 20 2 points  Months in reverse 4 points  Repeat phrase 2 points  Total Score 11    Immunizations Immunization History  Administered Date(s) Administered   Influenza,inj,Quad PF,6+ Mos 02/23/2015, 09/14/2015, 09/11/2016, 11/26/2017, 09/20/2018, 10/22/2020   Influenza-Unspecified 10/14/2019   Janssen (J&J) SARS-COV-2 Vaccination 08/08/2019   Pneumococcal Polysaccharide-23 05/24/2014, 02/23/2015   Tdap 03/16/2014, 12/24/2019    TDAP status: Up to date  Flu Vaccine status: Up to date  Pneumococcal vaccine status: Completed during today's visit. N/A did not receive vaccination   Covid-19 vaccine status: Completed vaccines  Qualifies for Shingles  Vaccine? Yes   Zostavax completed Yes   Shingrix Completed?: Yes- Not completed pt not feeling well   Screening Tests Health Maintenance  Topic Date Due   Zoster Vaccines- Shingrix (1 of 2) Never done   COLONOSCOPY (Pts 45-36yrs Insurance  coverage will need to be confirmed)  08/31/2015   Pneumococcal Vaccine 69-68 Years old (2 - PCV) 02/23/2016   OPHTHALMOLOGY EXAM  09/04/2017   COVID-19 Vaccine (2 - Booster for Janssen series) 10/03/2019   HEMOGLOBIN A1C  04/25/2021   FOOT EXAM  07/09/2021   TETANUS/TDAP  12/23/2029   INFLUENZA VACCINE  Completed   Hepatitis C Screening  Completed   HIV Screening  Completed   HPV VACCINES  Aged Out    Health Maintenance  Health Maintenance Due  Topic Date Due   Zoster Vaccines- Shingrix (1 of 2) Never done   COLONOSCOPY (Pts 45-38yrs Insurance coverage will need to be confirmed)  08/31/2015   Pneumococcal Vaccine 64-77 Years old (2 - PCV) 02/23/2016   OPHTHALMOLOGY EXAM  09/04/2017   COVID-19 Vaccine (2 - Booster for Janssen series) 10/03/2019    Colorectal cancer screening: Referral to GI placed  . Pt aware the office will call re: appt.  Lung Cancer Screening: (Low Dose CT Chest recommended if Age 36-80 years, 30 pack-year currently smoking OR have quit w/in 15years.) does not qualify.   Lung Cancer Screening Referral:   Additional Screening:  Hepatitis C Screening: does qualify; Completed 06/01/2015  Vision Screening: Recommended annual ophthalmology exams for early detection of glaucoma and other disorders of the eye. Is the patient up to date with their annual eye exam?  No  Who is the provider or what is the name of the office in which the patient attends annual eye exams[facility] If pt is not established with a provider, would they like to be referred to a provider to establish care?  N/A .   Dental Screening: Recommended annual dental exams for proper oral hygiene  Community Resource Referral / Chronic Care Management: CRR required this visit?  No   CCM required this visit?  No      Plan:     I have personally reviewed and noted the following in the patients chart:   Medical and social history Use of alcohol, tobacco or illicit  drugs  Current medications and supplements including opioid prescriptions. Patient is not currently taking opioid prescriptions. Functional ability and status Nutritional status Physical activity Advanced directives List of other physicians Hospitalizations, surgeries, and ER visits in previous 12 months Vitals Screenings to include cognitive, depression, and falls Referrals and appointments  In addition, I have reviewed and discussed with patient certain preventive protocols, quality metrics, and best practice recommendations. A written personalized care plan for preventive services as well as general preventive health recommendations were provided to patient.     Elmon Else, [facility]   01/25/2021

## 2021-01-25 NOTE — Progress Notes (Signed)
Diabetes discussed in office.

## 2021-01-25 NOTE — Patient Instructions (Addendum)
[facility]  [address] [place],  [address]  Mr. Cragg , Thank you for taking time to come for your Medicare Wellness Visit. I appreciate your ongoing commitment to your health goals. Please review the following plan we discussed and let me know if I can assist you in the future.   These are the goals we discussed:  Goals      Blood Pressure < 140/90     HEMOGLOBIN A1C < 7.0        This is a list of the screening recommended for you and due dates:  Health Maintenance  Topic Date Due   Zoster (Shingles) Vaccine (1 of 2) Never done   Colon Cancer Screening  08/31/2015   Pneumococcal Vaccination (2 - PCV) 02/23/2016   Eye exam for diabetics  09/04/2017   COVID-19 Vaccine (2 - Booster for Janssen series) 10/03/2019   Hemoglobin A1C  04/25/2021   Complete foot exam   07/09/2021   Tetanus Vaccine  12/23/2029   Flu Shot  Completed   Hepatitis C Screening: USPSTF Recommendation to screen - Ages 18-79 yo.  Completed   HIV Screening  Completed   HPV Vaccine  Aged Out

## 2021-01-26 ENCOUNTER — Other Ambulatory Visit: Payer: Self-pay | Admitting: Family

## 2021-01-26 DIAGNOSIS — E782 Mixed hyperlipidemia: Secondary | ICD-10-CM

## 2021-01-26 LAB — LIPID PANEL
Chol/HDL Ratio: 6.2 ratio — ABNORMAL HIGH (ref 0.0–5.0)
Cholesterol, Total: 173 mg/dL (ref 100–199)
HDL: 28 mg/dL — ABNORMAL LOW (ref >39)
LDL Chol Calc (NIH): 37 mg/dL (ref 0–99)
Triglycerides: 797 mg/dL (ref 0–149)
VLDL Cholesterol Cal: 108 mg/dL — ABNORMAL HIGH (ref 5–40)

## 2021-01-26 LAB — TSH: TSH: 2.47 u[IU]/mL (ref 0.450–4.500)

## 2021-01-26 MED ORDER — ATORVASTATIN CALCIUM 80 MG PO TABS
80.0000 mg | ORAL_TABLET | Freq: Every day | ORAL | 0 refills | Status: DC
  Filled 2021-01-26: qty 100, 100d supply, fill #0
  Filled 2021-04-19: qty 90, 90d supply, fill #0

## 2021-01-26 NOTE — Progress Notes (Signed)
Please call patient with update.   Thyroid function normal.   Confirm if patient has been taking Atorvastatin 40 mg daily for high cholesterol. Will increase Atorvastatin to 80 mg daily. Encouraged to recheck fasting cholesterol in 4 to 6 weeks.

## 2021-01-28 ENCOUNTER — Other Ambulatory Visit: Payer: Self-pay

## 2021-01-29 ENCOUNTER — Ambulatory Visit (HOSPITAL_COMMUNITY)
Admission: EM | Admit: 2021-01-29 | Discharge: 2021-01-29 | Discharge status: Against Medical Advice | Payer: Medicare Other

## 2021-01-29 NOTE — BH Assessment (Signed)
Pt reports his pain management doctor was prescribing all his medications including his psych meds and a few weeks ago his doctor stop working and now he is left without psych meds. Pt reports he was going to [facility] prior to seeing pain mgt for his [place] meds. Pt denies SI, HI, AVH and substance use. Pt reports he only have a few pills left.   PT is routine

## 2021-01-30 ENCOUNTER — Other Ambulatory Visit: Payer: Self-pay

## 2021-01-31 ENCOUNTER — Other Ambulatory Visit: Payer: Self-pay

## 2021-02-04 ENCOUNTER — Other Ambulatory Visit: Payer: Self-pay

## 2021-02-05 ENCOUNTER — Encounter: Payer: Medicare Other | Admitting: Family

## 2021-02-06 ENCOUNTER — Ambulatory Visit (INDEPENDENT_AMBULATORY_CARE_PROVIDER_SITE_OTHER)
Admission: EM | Admit: 2021-02-06 | Discharge: 2021-02-06 | Disposition: A | Discharge status: Home / Self Care | Payer: Medicare Other | Source: Home / Self Care

## 2021-02-06 ENCOUNTER — Other Ambulatory Visit: Payer: Self-pay

## 2021-02-06 ENCOUNTER — Emergency Department (HOSPITAL_COMMUNITY)
Admission: EM | Admit: 2021-02-06 | Discharge: 2021-02-06 | Disposition: A | Discharge status: Home / Self Care | Payer: Medicare Other | Attending: Emergency Medicine | Admitting: Emergency Medicine

## 2021-02-06 DIAGNOSIS — R112 Nausea with vomiting, unspecified: Secondary | ICD-10-CM | POA: Diagnosis not present

## 2021-02-06 DIAGNOSIS — F1010 Alcohol abuse, uncomplicated: Secondary | ICD-10-CM

## 2021-02-06 DIAGNOSIS — R251 Tremor, unspecified: Secondary | ICD-10-CM | POA: Diagnosis not present

## 2021-02-06 DIAGNOSIS — R519 Headache, unspecified: Secondary | ICD-10-CM | POA: Insufficient documentation

## 2021-02-06 DIAGNOSIS — Z5321 Procedure and treatment not carried out due to patient leaving prior to being seen by health care provider: Secondary | ICD-10-CM | POA: Insufficient documentation

## 2021-02-06 DIAGNOSIS — F1090 Alcohol use, unspecified, uncomplicated: Secondary | ICD-10-CM | POA: Diagnosis not present

## 2021-02-06 DIAGNOSIS — Z9151 Personal history of suicidal behavior: Secondary | ICD-10-CM | POA: Insufficient documentation

## 2021-02-06 MED ORDER — CHLORDIAZEPOXIDE HCL 25 MG PO CAPS: 25.0000 mg | ORAL_CAPSULE | Freq: Three times a day (TID) | ORAL | 0 refills | Status: AC | PRN

## 2021-02-06 NOTE — Discharge Instructions (Addendum)
You are encouraged to follow up with [facility] for outpatient treatment.  Walk in/ Open Access Hours: Monday - Friday 8AM - 11AM (To see provider and therapist) - Arrive around 7 or 7:15 to have a better chance of being seen, as slots fill up.   Friday - 1PM - 4PM (To see therapist only)  [facility] [address] [place], [place]  Discharge recommendations:  Patient is to take medications as prescribed. Please see information for follow-up appointment with psychiatry and therapy. Please follow up with your primary care provider for all medical related needs.   Therapy: We recommend that patient participate in individual therapy to address mental health concerns.  Medications: The parent/guardian is to contact a medical professional and/or outpatient provider to address any new side effects that develop. Parent/guardian should update outpatient providers of any new medications and/or medication changes.   Safety:  The patient should abstain from use of illicit substances/drugs and abuse of any medications. If symptoms worsen or do not continue to improve or if the patient becomes actively suicidal or homicidal then it is recommended that the patient return to the closest hospital emergency department, the [facility], or call 911 for further evaluation and treatment. National Suicide Prevention Lifeline 1-800-SUICIDE or (413) 826-1549.  About 988 988 offers 24/7 access to trained crisis counselors who can help people experiencing mental health-related distress. People can call or text 988 or chat 988lifeline.org for themselves or if they are worried about a loved one who may need crisis support.

## 2021-02-06 NOTE — ED Triage Notes (Signed)
Patient stated he's been drinking heavily since his mom passed away Jul 31, 2020. He c/o that he feels bad when he doesn't drink, headache, shakes and NV. Reported his last alcohol intake was today at noon. He stated that he needs help to stop drinking.

## 2021-02-06 NOTE — ED Provider Notes (Signed)
Behavioral Health Urgent Care Medical Screening Exam  Patient Name: [name] [name] MRN: [id number] Date of Evaluation: 02/07/21 Chief Complaint:   Diagnosis:  Final diagnoses:  Alcohol abuse    History of Present illness: [name] [name] is a 61 y.o. male with a history of bipolar disorder, suicidal attempt, depression, and alcohol abuse.  Patient presented voluntarily to [facility] requesting assistance with alcohol abuse detox.   Patient was seen face-to-face and his chart was reviewed by this nurse practitioner.  On assessment, patient is alert and oriented x4.  Patient is calm and cooperative.  He is speaking in a normal tone of voice at moderate rate with good eye contact.  Patient's mood is anxious with congruent affect.  Patient's thought process is coherent and relevant.  Patient did not appear to be responding to any internal/external stimuli or experiencing any delusional thought content during this assessment.  Patient denies all medical complaints.  Patient shares that he has been abusing alcohol since July 2022.  He reports that his mother and stepfather died within 3 months of each other and he  began abusing alcohol after their deaths to cope with grieve and loneliness. He reports that he drinks approximately 1/5th of liquor daily. Patient reports that he experiences alcohol withdrawal symptoms when I abstain from consuming alcohol. He states "I feel sick if I don't drink; I get shaky, anxious, sweaty, and I vomit everywhere." Patient shares "I don't enjoy or like the taste of it but I have to drink because of the withdraws. I don't have a problem with stopping the alcohol. I just need medication to take away the symptoms." Patient denies worsening depressive symptoms. He contracted for safety and denies SI/HI/psychosis. He denies history of alcohol withdraw seizure or delirium tremens.  Support and encouragement provided to the patient. Discussed treatment options with patient  including admission to [facility] for stabilization and detox vs continuous assessment in [facility].  Patient declined admission.  Patient reported that he his only interested in medication to relieve alcohol withdraw symptoms and prefers to detox at home. He denies history of seizure/alcohol withdraw seizure, DT, or alcohol related hypoglycemia.    Psychiatric Specialty Exam  Presentation  General Appearance:No data recorded Eye Contact:No data recorded Speech:No data recorded Speech Volume:No data recorded Handedness:No data recorded  Mood and Affect  Mood:No data recorded Affect:No data recorded  Thought Process  Thought Processes:No data recorded Descriptions of Associations:No data recorded Orientation:No data recorded Thought Content:No data recorded   Hallucinations:No data recorded Ideas of Reference:No data recorded Suicidal Thoughts:No data recorded Homicidal Thoughts:No data recorded  Sensorium  Memory:No data recorded Judgment:No data recorded Insight:No data recorded  Executive Functions  Concentration:No data recorded Attention Span:No data recorded Recall:No data recorded Fund of Knowledge:No data recorded Language:No data recorded  Psychomotor Activity  Psychomotor Activity:No data recorded  Assets  Assets:No data recorded  Sleep  Sleep:No data recorded Number of hours: No data recorded  No data recorded  Physical Exam: Physical Exam Vitals and nursing note reviewed.  Constitutional:      General: He is not in acute distress.    Appearance: He is well-developed.  HENT:     Head: Normocephalic and atraumatic.  Eyes:     Conjunctiva/sclera: Conjunctivae normal.  Cardiovascular:     Rate and Rhythm: Normal rate and regular rhythm.     Heart sounds: No murmur heard. Pulmonary:     Effort: Pulmonary effort is normal. No respiratory distress.     Breath sounds: Normal  breath sounds.  Abdominal:     Palpations: Abdomen is soft.     Tenderness: There  is no abdominal tenderness.  Musculoskeletal:        General: No swelling.     Cervical back: Normal range of motion and neck supple.  Skin:    General: Skin is warm and dry.     Capillary Refill: Capillary refill takes less than 2 seconds.  Neurological:     Mental Status: He is alert and oriented to person, place, and time.  Psychiatric:        Attention and Perception: He does not perceive auditory or visual hallucinations.        Mood and Affect: Mood is anxious. Mood is not depressed.        Speech: Speech normal.        Behavior: Behavior normal. Behavior is cooperative.        Thought Content: Thought content normal. Thought content is not paranoid or delusional. Thought content does not include homicidal or suicidal ideation. Thought content does not include homicidal or suicidal plan.        Cognition and Memory: Cognition normal.   Review of Systems  Constitutional: Negative.   HENT: Negative.    Eyes: Negative.   Respiratory: Negative.    Cardiovascular: Negative.   Gastrointestinal: Negative.   Genitourinary: Negative.   Musculoskeletal: Negative.   Skin: Negative.   Neurological: Negative.   Endo/Heme/Allergies: Negative.   Psychiatric/Behavioral:  Positive for substance abuse. Negative for depression, hallucinations and suicidal ideas. The patient is nervous/anxious.   Blood pressure (!) 196/98, pulse 67, temperature 98.1 F (36.7 C), temperature source Oral, resp. rate 20, SpO2 99 %. There is no height or weight on file to calculate BMI.  Musculoskeletal: Strength & Muscle Tone: within normal limits Gait & Station: normal Patient leans: Right   [place] [facility] Discharge Disposition for Follow up and Recommendations: Based on my evaluation the patient does not appear to have an emergency medical condition and can be discharged with resources and follow up care in outpatient services for Medication Management and Individual Therapy  Discussed treatment options with  patient including admission to [facility] for stabilization and detox vs continuous assessment in [facility].  Patient declined admission.  Patient reported that he his only interested in medication to relieve alcohol withdraw symptoms and prefers to detox at home. He denies history of seizure/alcohol withdraw seizure, DT, or alcohol related hypoglycemia.   Outpatient substance abuse treatment program resources provided to patient.   -prescription for Librium 25mg  Q 8 hours as needed for withdrawal symptoms for 4 days.   No evidence of imminent danger to self or others at this time. Patient does not meet criteria for psychiatric admission or IVC. Supportive therapy provided about ongoing stressors. Discussed crisis plan, callling [phone]/988 or going to Emergency Dept   Ophelia Shoulder, NP 02/07/2021, 1:24 AM

## 2021-02-06 NOTE — BH Assessment (Incomplete)
[name] [name] is a 61 year old male presenting voluntary as a walk-in to [facility] due to alcohol detox. Patient reported wanting to stop drinking alcohol. Patient was crying during entire assessment. Patient reported drinking "5th alcohol" daily. Patient reported that he started drinking last year after he mother died 31-Jul-2020. Patient reported feeling alone.

## 2021-02-07 NOTE — Progress Notes (Signed)
°   02/06/21 1955  [place] Triage Screening (Walk-ins at [facility] only)  How Did You Hear About [place]? Self  What Is the Reason for Your Visit/Call Today? [name] [name] is a 61 year old male presenting voluntary as a walk-in to [facility] due to alcohol detox. Patient reported wanting to stop drinking alcohol. Patient was crying during entire assessment. Patient reported drinking "5th alcohol" daily. Patient reported that he started drinking last year after he mother died 2020-08-05. Patient reported feeling alone. Patient denied SI, HI and psychosis. Patient was cooperative during assessment. Patient requests medication to assist with stopping use the patient.  How Long Has This Been Causing You Problems? > than 6 months  Have You Recently Had Any Thoughts About Hurting Yourself? No  Are You Planning to Commit Suicide/Harm Yourself At This time? No  Have you Recently Had Thoughts About [place]? No  Are You Planning To Harm Someone At This Time? No  Are you currently experiencing any auditory, visual or other hallucinations? No  Have You Used Any Alcohol or Drugs in the Past 24 Hours? Yes  How long ago did you use Drugs or Alcohol? alcohol  What Did You Use and How Much? 5th of alcohol  Do you have any current medical co-morbidities that require immediate attention? No  Clinician description of patient physical appearance/behavior: casual / tearful and cooperative  What Do You Feel Would Help You the Most Today? Alcohol or Drug Use Treatment  If access to [facility] Ur[facility] was not available, would you have sought care in the Emergency Department? Yes  Determination of Need Routine (7 days)  Options For Referral Outpatient Therapy;Medication Management;Chemical Dependency Intensive Outpatient Therapy (CDIOP)

## 2021-02-12 ENCOUNTER — Other Ambulatory Visit: Payer: Self-pay

## 2021-02-12 DIAGNOSIS — M129 Arthropathy, unspecified: Secondary | ICD-10-CM | POA: Diagnosis not present

## 2021-02-12 DIAGNOSIS — G894 Chronic pain syndrome: Secondary | ICD-10-CM | POA: Diagnosis not present

## 2021-02-12 DIAGNOSIS — R5383 Other fatigue: Secondary | ICD-10-CM | POA: Diagnosis not present

## 2021-02-12 DIAGNOSIS — M5137 Other intervertebral disc degeneration, lumbosacral region: Secondary | ICD-10-CM | POA: Diagnosis not present

## 2021-02-12 DIAGNOSIS — Z79899 Other long term (current) drug therapy: Secondary | ICD-10-CM | POA: Diagnosis not present

## 2021-02-12 DIAGNOSIS — E7800 Pure hypercholesterolemia, unspecified: Secondary | ICD-10-CM | POA: Diagnosis not present

## 2021-02-13 ENCOUNTER — Telehealth (HOSPITAL_COMMUNITY): Payer: Self-pay | Admitting: Family

## 2021-02-13 NOTE — BH Assessment (Signed)
Care Management - [place] Follow Up Discharges   Writer attempted to make contact with patient today and was unsuccessful.  The phone number is disconnected.   Per chart review, patient was provided with substance abuse outpatient resources.

## 2021-02-14 DIAGNOSIS — Z79899 Other long term (current) drug therapy: Secondary | ICD-10-CM | POA: Diagnosis not present

## 2021-02-15 DIAGNOSIS — Z79899 Other long term (current) drug therapy: Secondary | ICD-10-CM | POA: Diagnosis not present

## 2021-02-15 DIAGNOSIS — E559 Vitamin D deficiency, unspecified: Secondary | ICD-10-CM | POA: Diagnosis not present

## 2021-02-15 DIAGNOSIS — R5383 Other fatigue: Secondary | ICD-10-CM | POA: Diagnosis not present

## 2021-02-15 DIAGNOSIS — G894 Chronic pain syndrome: Secondary | ICD-10-CM | POA: Diagnosis not present

## 2021-03-04 NOTE — Telephone Encounter (Signed)
I attempted to call this pt again, no answer, left vm.

## 2021-03-15 DIAGNOSIS — E7800 Pure hypercholesterolemia, unspecified: Secondary | ICD-10-CM | POA: Diagnosis not present

## 2021-03-15 DIAGNOSIS — Z131 Encounter for screening for diabetes mellitus: Secondary | ICD-10-CM | POA: Diagnosis not present

## 2021-03-15 DIAGNOSIS — G894 Chronic pain syndrome: Secondary | ICD-10-CM | POA: Diagnosis not present

## 2021-03-15 DIAGNOSIS — Z79899 Other long term (current) drug therapy: Secondary | ICD-10-CM | POA: Diagnosis not present

## 2021-03-15 NOTE — Telephone Encounter (Signed)
I spoke with pt and he stated that he is already seeing a therapist. I informed pt to call back if anything changes.  ?

## 2021-04-11 DIAGNOSIS — G894 Chronic pain syndrome: Secondary | ICD-10-CM | POA: Diagnosis not present

## 2021-04-19 ENCOUNTER — Other Ambulatory Visit: Payer: Self-pay

## 2021-04-22 NOTE — Progress Notes (Deleted)
? ? ?Patient ID: [name] [name], male    DOB: 04-Dec-1960  MRN: [id number] ? ?CC: Hypertension Follow-Up ? ?Subjective: ?[name] [name] is a 61 y.o. male who presents for hypertension follow-up. ? ?His concerns today include:  ? ?DM managed by Endo  ?Anxiety, Bipolar, Alcohol managed by Psych ? ?HYPERTENSION FOLLOW-UP: ?01/25/2021: ?- Hydrochlorothiazide and Losartan as prescribed. Patient reports has not been taking as he should.  ? ? ?04/25/2021: ? ?2. HYPERLIPIDEMIA FOLLOW-UP: ?Atorvastatin  ? ? ?Patient Active Problem List  ? Diagnosis Date Noted  ? Diabetic foot infection ([place]) 04/26/2020  ? Penetrating wound of left foot   ? Skin lesion 12/07/2019  ? Chronic pain disorder 09/13/2019  ? DDD (degenerative disc disease), lumbar 09/13/2019  ? Dyslipidemia 09/13/2019  ? Obesity 09/13/2019  ? Peripheral neuropathy 09/13/2019  ? Sleep apnea   ? Prolonged Q-T interval on ECG 12/03/2016  ? Drug-induced torsades de pointes ([place]) 12/03/2016  ? Thrombocytopenia ([place]) 11/29/2016  ? Personal history of ECMO 11/26/2016  ? Personal history of spine surgery 11/26/2016  ? S/P rotator cuff repair 11/26/2016  ? 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 11/13/2016  ? Mild concentric left ventricular hypertrophy (LVH) 09/11/2016  ? DJD (degenerative joint disease) of cervical spine 02/26/2016  ? Tinea pedis of both feet 06/03/2015  ? Pre-ulcerative calluses 06/01/2015  ? Type 2 diabetes mellitus with diabetic polyneuropathy ([place]) 06/09/2014  ? Generalized anxiety disorder 06/19/2012  ? Herpes zoster 12/05/2008  ? Mixed hyperlipidemia 05/11/2008  ? DEPRESSION 05/11/2008  ? Essential hypertension 05/11/2008  ? GERD 05/11/2008  ? OSTEOARTHRITIS 05/11/2008  ? LOW BACK PAIN 05/11/2008  ? COLONIC POLYPS, HX OF 05/11/2008  ?  ? ?Current Outpatient Medications on File Prior to Visit  ?Medication Sig Dispense Refill  ? atorvastatin (LIPITOR) 80 MG tablet Take 1 tablet (80 mg total) by mouth daily. 120 tablet 0   ? buprenorphine (SUBUTEX) 8 MG SUBL SL tablet Place 8 mg under the tongue every 8 (eight) hours.    ? Dulaglutide (TRULICITY) 3.29 JM/4.2AS SOPN Inject 0.75 mg into the skin once a week. 2 mL 2  ? fenofibrate (TRICOR) 145 MG tablet Take 1 tablet (145 mg total) by mouth daily. 90 tablet 1  ? ferrous sulfate 325 (65 FE) MG EC tablet Take 1 tablet (325 mg total) by mouth daily with breakfast. 30 tablet 2  ? furosemide (LASIX) 20 MG tablet Take 1 tablet (20 mg total) by mouth daily for 7 days. 7 tablet 0  ? hydrochlorothiazide (HYDRODIURIL) 25 MG tablet TAKE 1 TABLET(25 MG) BY MOUTH DAILY 90 tablet 0  ? Lancets (FREESTYLE) lancets Use as instructed    ? losartan (COZAAR) 100 MG tablet TAKE 1 TABLET(100 MG) BY MOUTH DAILY 90 tablet 0  ? metFORMIN (GLUCOPHAGE-XR) 500 MG 24 hr tablet Take 2 tablets (1,000 mg total) by mouth 2 (two) times daily. 360 tablet 0  ? omeprazole (PRILOSEC) 40 MG capsule Take 1 capsule (40 mg total) by mouth at bedtime. 30 capsule 1  ? ondansetron (ZOFRAN) 4 MG tablet Take 1 tablet (4 mg total) by mouth every 8 (eight) hours as needed for nausea or vomiting. 20 tablet 0  ? potassium chloride SA (KLOR-CON) 20 MEQ tablet Take 1 tablet (20 mEq total) by mouth daily. 30 tablet 2  ? prazosin (MINIPRESS) 2 MG capsule Take 2 mg by mouth at bedtime.    ? QUEtiapine (SEROQUEL) 300 MG tablet Take 150 mg by mouth at bedtime.    ?  sertraline (ZOLOFT) 100 MG tablet Take 100 mg by mouth daily.    ? Syringe/Needle, Disp, 30G X 1/2" 1 ML MISC UAD - use as directed 100 each 5  ? vitamin B-12 (CYANOCOBALAMIN) 1000 MCG tablet Take 1 tablet (1,000 mcg total) by mouth daily. 30 tablet 0  ? ?No current facility-administered medications on file prior to visit.  ? ? ?Allergies  ?Allergen Reactions  ? Heparin Other (See Comments)  ?  HIT Ab negative on 02/20/15, but SRA POSITIVE   ? Oxytetracycline Rash and Other (See Comments)  ? Del-Mycin [Erythromycin]   ?  All mycin drugs - unknown reaction  ? Other   ?  Mycins unknown  reaction  ? Sulfa Antibiotics   ?  Unknown reaction  ? Sulfonamide Derivatives   ?  Unknown reaction  ? ? ?Social History  ? ?Socioeconomic History  ? Marital status: Divorced  ?  Spouse name: Not on file  ? Number of children: 3  ? Years of education: Not on file  ? Highest education level: Not on file  ?Occupational History  ? Occupation: Disabled  ?Tobacco Use  ? Smoking status: Never  ? Smokeless tobacco: Never  ?Vaping Use  ? Vaping Use: Never used  ?Substance and Sexual Activity  ? Alcohol use: Not Currently  ? Drug use: No  ? Sexual activity: Not Currently  ?Other Topics Concern  ? Not on file  ?Social History Narrative  ? ** Merged History Encounter **  ?    ? ?Social Determinants of Health  ? ?Financial Resource Strain: Not on file  ?Food Insecurity: Not on file  ?Transportation Needs: Not on file  ?Physical Activity: Not on file  ?Stress: Not on file  ?Social Connections: Not on file  ?Intimate Partner Violence: Not on file  ? ? ?Family History  ?Problem Relation Age of Onset  ? Diabetes Mother   ? Hyperlipidemia Mother   ? Heart disease Father   ? Colon cancer Neg Hx   ? Colon polyps Neg Hx   ? Esophageal cancer Neg Hx   ? Rectal cancer Neg Hx   ? Stomach cancer Neg Hx   ? ? ?Past Surgical History:  ?Procedure Laterality Date  ? COLONOSCOPY    ? EXTRACORPOREAL CIRCULATION  11/2015  ? 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 INTRAOPERATIVE TRANSESOPHAGEAL ECHOCARDIOGRAM N/A 11/26/2016  ? Procedure: INTRAOPERATIVE TRANSESOPHAGEAL ECHOCARDIOGRAM;  Surgeon: Ivin Poot, MD;  Location: [place];  Service: Open Heart Surgery;  Laterality: N/A;  ? IR ANGIOGRAM EXTREMITY LEFT  06/05/2020  ? IR PTA NON CORO-LOWER EXTREM  06/05/2020  ? IR RADIOLOGIST EVAL & MGMT  05/21/2020  ? IR US GUIDE VASC ACCESS LEFT  06/05/2020  ? POLYPECTOMY    ? SHOULDER ARTHROSCOPY WITH ROTATOR CUFF REPAIR Right 2009  ? [place] SURGERY  2010  ? TRANSTHORACIC ECHOCARDIOGRAM  07/2016;  12/10/2016  ? a. Prior to VT arrest: normal. EF 55-60%. Gr 1 DD.  mild LVH. Mildly dilated Aortic Root.;; b.  Normal LV size and function.  Mild LVH.  EF 55%.  No RWMA.  No valve abnormalities.  ? ? ?ROS: ?Review of Systems ?Negative except as stated above ? ?PHYSICAL EXAM: ?There were no vitals taken for this visit. ? ?Physical Exam ? ?{male adult master:310786} ?{male adult master:310785} ? ? ?  Latest Ref Rng & Units 12/06/2020  ?  4:49 PM 06/19/2020  ?  4:26 PM 05/10/2020  ?  9:21 AM  ?CMP  ?Glucose 70 - 99 mg/dL 276   190   129    ?BUN 6 - 20 mg/dL '18   22   17    '$ ?Creatinine 0.61 - 1.24 mg/dL 1.05   0.92   0.80    ?Sodium 135 - 145 mmol/L 136   140   140    ?Potassium 3.5 - 5.1 mmol/L 3.8   4.0   3.9    ?Chloride 98 - 111 mmol/L 101   100   102    ?CO2 22 - 32 mmol/L '23   23   22    '$ ?Calcium 8.9 - 10.3 mg/dL 9.1   9.3   9.4    ?Total Protein 6.5 - 8.1 g/dL 6.7      ?Total Bilirubin 0.3 - 1.2 mg/dL 0.6      ?Alkaline Phos 38 - 126 U/L 48      ?AST 15 - 41 U/L 33      ?ALT 0 - 44 U/L 27      ? ?Lipid Panel  ?   ?Component Value Date/Time  ? CHOL 173 01/25/2021 1025  ? TRIG 797 (HH) 01/25/2021 1025  ? HDL 28 (L) 01/25/2021 1025  ? CHOLHDL 6.2 (H) 01/25/2021 1025  ? CHOLHDL 5.5 (H) 06/01/2015 1104  ? VLDL 53 (H) 06/01/2015 1104  ? LDLCALC 37 01/25/2021 1025  ? ? ?CBC ?   ?Component Value Date/Time  ? WBC 5.4 12/06/2020 1649  ? RBC 4.78 12/06/2020 1649  ? HGB 13.3 12/06/2020 1649  ? HGB 12.3 (L) 06/19/2020 1626  ? HCT 38.0 (L) 12/06/2020 1649  ? HCT 36.4 (L) 06/19/2020 1626  ? PLT 140 (L) 12/06/2020 1649  ? PLT 194 06/19/2020 1626  ? MCV 79.5 (L) 12/06/2020 1649  ? MCV 78 (L) 06/19/2020 1626  ? MCH 27.8 12/06/2020 1649  ? MCHC 35.0 12/06/2020 1649  ? RDW 13.3 12/06/2020 1649  ? RDW 14.5 06/19/2020 1626  ? LYMPHSABS 1.1 12/06/2020 1649  ? LYMPHSABS 1.1 11/30/2019 1122  ? MONOABS 0.3 12/06/2020 1649  ? EOSABS 0.2 12/06/2020 1649  ? EOSABS 0.1 11/30/2019 1122  ? BASOSABS 0.1 12/06/2020 1649  ? BASOSABS 0.0 11/30/2019  1122  ? ? ?ASSESSMENT AND PLAN: ? ?There are no diagnoses linked to this encounter. ? ? ?Patient was given the opportunity to ask questions.  Patient verbalized understanding of the plan and was able to

## 2021-04-24 ENCOUNTER — Other Ambulatory Visit: Payer: Self-pay

## 2021-04-25 ENCOUNTER — Other Ambulatory Visit: Payer: Self-pay

## 2021-04-25 ENCOUNTER — Ambulatory Visit: Payer: Medicare Other | Admitting: Family

## 2021-04-25 DIAGNOSIS — E782 Mixed hyperlipidemia: Secondary | ICD-10-CM

## 2021-04-25 DIAGNOSIS — I10 Essential (primary) hypertension: Secondary | ICD-10-CM

## 2021-05-01 DIAGNOSIS — G894 Chronic pain syndrome: Secondary | ICD-10-CM | POA: Diagnosis not present

## 2021-05-22 DIAGNOSIS — G894 Chronic pain syndrome: Secondary | ICD-10-CM | POA: Diagnosis not present

## 2021-05-29 ENCOUNTER — Other Ambulatory Visit: Payer: Self-pay

## 2021-06-12 DIAGNOSIS — Z79899 Other long term (current) drug therapy: Secondary | ICD-10-CM | POA: Diagnosis not present

## 2021-06-12 DIAGNOSIS — G894 Chronic pain syndrome: Secondary | ICD-10-CM | POA: Diagnosis not present

## 2021-06-18 NOTE — Progress Notes (Signed)
Erroneous encounter-disregard

## 2021-06-20 ENCOUNTER — Ambulatory Visit (HOSPITAL_COMMUNITY)
Admission: EM | Admit: 2021-06-20 | Discharge: 2021-06-21 | Disposition: A | Discharge status: Home / Self Care | Payer: Medicare Other | Attending: Psychiatry | Admitting: Psychiatry

## 2021-06-20 DIAGNOSIS — F3131 Bipolar disorder, current episode depressed, mild: Secondary | ICD-10-CM

## 2021-06-20 DIAGNOSIS — Z20822 Contact with and (suspected) exposure to covid-19: Secondary | ICD-10-CM | POA: Diagnosis not present

## 2021-06-20 DIAGNOSIS — F4321 Adjustment disorder with depressed mood: Secondary | ICD-10-CM | POA: Diagnosis not present

## 2021-06-20 DIAGNOSIS — F1020 Alcohol dependence, uncomplicated: Secondary | ICD-10-CM

## 2021-06-20 LAB — POC SARS CORONAVIRUS 2 AG: SARSCOV2ONAVIRUS 2 AG: NEGATIVE

## 2021-06-20 LAB — POCT URINE DRUG SCREEN - MANUAL ENTRY (I-SCREEN)
POC Amphetamine UR: NOT DETECTED
POC Buprenorphine (BUP): POSITIVE — AB
POC Cocaine UR: NOT DETECTED
POC Marijuana UR: NOT DETECTED
POC Methadone UR: NOT DETECTED
POC Methamphetamine UR: NOT DETECTED
POC Morphine: NOT DETECTED
POC Oxazepam (BZO): NOT DETECTED
POC Oxycodone UR: NOT DETECTED
POC Secobarbital (BAR): NOT DETECTED

## 2021-06-20 LAB — GLUCOSE, CAPILLARY: Glucose-Capillary: 312 mg/dL — ABNORMAL HIGH (ref 70–99)

## 2021-06-20 MED ORDER — PANTOPRAZOLE SODIUM 40 MG PO TBEC
40.0000 mg | DELAYED_RELEASE_TABLET | Freq: Every day | ORAL | Status: DC
  Administered 2021-06-21: 40 mg via ORAL
  Filled 2021-06-20: qty 1

## 2021-06-20 MED ORDER — ADULT MULTIVITAMIN W/MINERALS CH
1.0000 | ORAL_TABLET | Freq: Every day | ORAL | Status: DC
  Administered 2021-06-21: 1 via ORAL
  Filled 2021-06-20: qty 1

## 2021-06-20 MED ORDER — FUROSEMIDE 20 MG PO TABS
20.0000 mg | ORAL_TABLET | Freq: Every day | ORAL | Status: DC
  Administered 2021-06-21: 20 mg via ORAL
  Filled 2021-06-20: qty 1

## 2021-06-20 MED ORDER — QUETIAPINE FUMARATE 50 MG PO TABS
150.0000 mg | ORAL_TABLET | Freq: Every day | ORAL | Status: DC
  Administered 2021-06-21: 150 mg via ORAL
  Filled 2021-06-20: qty 1

## 2021-06-20 MED ORDER — METFORMIN HCL ER 500 MG PO TB24
1000.0000 mg | ORAL_TABLET | Freq: Two times a day (BID) | ORAL | Status: DC
  Administered 2021-06-21: 1000 mg via ORAL
  Filled 2021-06-20: qty 2

## 2021-06-20 MED ORDER — LORAZEPAM 1 MG PO TABS: 1.0000 mg | ORAL_TABLET | Freq: Four times a day (QID) | ORAL | Status: DC | PRN

## 2021-06-20 MED ORDER — PRAZOSIN HCL 2 MG PO CAPS
2.0000 mg | ORAL_CAPSULE | Freq: Every day | ORAL | Status: DC
  Administered 2021-06-21: 2 mg via ORAL
  Filled 2021-06-20: qty 1

## 2021-06-20 MED ORDER — ALUM & MAG HYDROXIDE-SIMETH 200-200-20 MG/5ML PO SUSP: 30.0000 mL | ORAL | Status: DC | PRN

## 2021-06-20 MED ORDER — LOPERAMIDE HCL 2 MG PO CAPS
2.0000 mg | ORAL_CAPSULE | ORAL | Status: DC | PRN
  Administered 2021-06-21: 2 mg via ORAL
  Filled 2021-06-20: qty 1

## 2021-06-20 MED ORDER — ACETAMINOPHEN 325 MG PO TABS: 650.0000 mg | ORAL_TABLET | Freq: Four times a day (QID) | ORAL | Status: DC | PRN

## 2021-06-20 MED ORDER — THIAMINE HCL 100 MG PO TABS
100.0000 mg | ORAL_TABLET | Freq: Every day | ORAL | Status: DC
  Administered 2021-06-21: 100 mg via ORAL
  Filled 2021-06-20: qty 1

## 2021-06-20 MED ORDER — SERTRALINE HCL 100 MG PO TABS
100.0000 mg | ORAL_TABLET | Freq: Every day | ORAL | Status: DC
  Administered 2021-06-21: 100 mg via ORAL
  Filled 2021-06-20: qty 1

## 2021-06-20 MED ORDER — HYDROXYZINE HCL 25 MG PO TABS
25.0000 mg | ORAL_TABLET | Freq: Four times a day (QID) | ORAL | Status: DC | PRN
  Administered 2021-06-21: 25 mg via ORAL
  Filled 2021-06-20: qty 1

## 2021-06-20 MED ORDER — ATORVASTATIN CALCIUM 40 MG PO TABS
80.0000 mg | ORAL_TABLET | Freq: Every day | ORAL | Status: DC
  Administered 2021-06-21: 80 mg via ORAL
  Filled 2021-06-20: qty 2

## 2021-06-20 MED ORDER — LOSARTAN POTASSIUM 50 MG PO TABS
25.0000 mg | ORAL_TABLET | Freq: Every day | ORAL | Status: DC
  Administered 2021-06-21 (×2): 25 mg via ORAL
  Filled 2021-06-20 (×2): qty 1

## 2021-06-20 MED ORDER — MAGNESIUM HYDROXIDE 400 MG/5ML PO SUSP: 30.0000 mL | Freq: Every day | ORAL | Status: DC | PRN

## 2021-06-20 NOTE — ED Notes (Signed)
Pt allowed for this writer to attempt blood collection x2.  Unable to obtain blood sample at this time.  Pt reports he has not been drinking fluids through out the day.  Large cup of water provider to assist with hydration.
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
## 2021-06-20 NOTE — ED Provider Notes (Signed)
[facility]gent Care Continuous Assessment Admission H&P  Date: 06/20/21 Patient Name: [name] [name] MRN: [id number] Chief Complaint: "I've been everything but a drinker".  Chief Complaint  Patient presents with   Alcohol Problem      Diagnoses:  Final diagnoses:  Alcohol use disorder, severe, dependence ([place])  Bipolar 1 disorder, depressed, mild (HCC)  Grief    HPI: [name] [name] is a 61 year old male who presented voluntarily, seeking medication to help him from feeling sick due to his daily alcohol abuse. He reports drinking a fifth of bourbon daily for the past three months because he is "going through a lot". He elaborates by stating that he lost both his mother and step father within a space of three months last year, and he was their caregiver. After they passed, he became lonely and started drinking then. He was able to get help and stopped drinking for a while, but relapsed after his daughter attempted suicide and his nephew committed suicide. He currently lives alone and reports that he has been divorced three times. He has grown children and they all live far away. He also has a brother who lives out of town. The patient describes his life as lonely as he is retired and on now on disability. He reports that he has always looked for something to fill the void after his parents passed. The patient reports a family background of alcoholism, five of his maternal uncles were alcoholics, his grandfathers were alcoholics. He reports " I don't know why I started drinking, but I did". He adds, my whole family has been messed up right from when I was little as I saw the damage alcohol caused in my family. "My mother always said everyone has to make their own choices and she was the only one that didn't drink". He then adds "well, I guess I stared drinking to cope with the loss and loneliness, and then I'm bipolar too, so, it has got to the point where I have to drink to not be sick".   He  restates " I went to the hospital three months ago to get help, and I stopped drinking, then my daughter attempted suicide and my nephew committed suicide,then I went back to alcohol.  He adds, "I'm going out to [place] to be the pall bearer at my nephew's funeral tonight but I need that medicine, so I wont be sick or have to run to the nearest [facility] store to prevent myself from being sick. "Im disgusted with myself, and don't want family to see me that way".  The patient reports that he does not enjoy drinking, but drink to "keep from feeling sick with symptoms in the mornings such as "getting hot, heart racing, nausea, and overall feeling "bad".   On evaluation, patient is alert, oriented x 3, and cooperative. Speech is clear, coherent and logical. Pt appears appropriate for the environment. Eye contact is fair. Mood is hopeless, affect is congruent with mood. Thought process is coherent and thought content is logical. Pt denies SI/HI/AVH. There is no indication that the patient is responding to internal stimuli. No delusions elicited during this assessment.   PHQ 2-9:  [place] Visit from 01/25/2021 in Primary Care at [facility] Visit from 10/22/2020 in Primary Care at [facility] Visit from 06/19/2020 in Primary Care at [place] that you would be better off dead, or of hurting yourself in some way Not at all Not at  all Not at all  PHQ-9 Total Score 11 2 0       Flowsheet Row ED from 12/06/2020 in [place] ED to Hosp-Admission (Discharged) from 04/26/2020 in [place] ED from 04/23/2020 in [facility] [place] No Risk No Risk No Risk        Total Time spent with patient: 20 minutes  Musculoskeletal  Strength & Muscle Tone: within normal limits Gait & Station: normal Patient leans: N/A  Psychiatric Specialty Exam   Presentation General Appearance: Appropriate for Environment  Eye Contact:Fair  Speech:Clear and Coherent  Speech Volume:Normal  Handedness:Right   Mood and Affect  Mood:Hopeless  Affect:Congruent   Thought Process  Thought Processes:Coherent  Descriptions of Associations:Intact  Orientation:Partial  Thought Content:Logical    Hallucinations:Hallucinations: None  Ideas of Reference:None  Suicidal Thoughts:Suicidal Thoughts: No  Homicidal Thoughts:Homicidal Thoughts: No   Sensorium  Memory:Immediate Good; Recent Good; Remote Good  Judgment:Poor  Insight:Fair   Executive Functions  Concentration:Fair  Attention Span:Fair  [place]   Psychomotor Activity  Psychomotor Activity:Psychomotor Activity: Normal   Assets  Assets:Communication Skills; Desire for Improvement; Financial Resources/Insurance; Housing; Transportation   Sleep  Sleep:Sleep: Fair   Nutritional Assessment (For OBS and FBC admissions only) Has the patient had a weight loss or gain of 10 pounds or more in the last 3 months?: No Has the patient had a decrease in food intake/or appetite?: No Does the patient have dental problems?: No Does the patient have eating habits or behaviors that may be indicators of an eating disorder including binging or inducing vomiting?: No Has the patient recently lost weight without trying?: 0 Has the patient been eating poorly because of a decreased appetite?: 0 Malnutrition Screening Tool Score: 0    Physical Exam Constitutional:      Appearance: He is not ill-appearing or diaphoretic.  HENT:     Head: Normocephalic.     Right Ear: External ear normal.     Left Ear: External ear normal.  Eyes:     General:        Right eye: No discharge.        Left eye: No discharge.  Cardiovascular:     Rate and Rhythm: Normal rate.  Pulmonary:     Effort: No respiratory distress.  Chest:     Chest  wall: No tenderness.  Neurological:     Mental Status: He is alert.     Sensory: No sensory deficit.     Motor: No weakness.     Coordination: Coordination normal.  Psychiatric:        Attention and Perception: Attention normal. He does not perceive auditory or visual hallucinations.        Mood and Affect: Mood is depressed. Mood is not anxious. Affect is not blunt or angry.        Speech: Speech normal.        Behavior: Behavior normal. Behavior is cooperative.        Thought Content: Thought content is not paranoid or delusional. Thought content does not include homicidal or suicidal ideation. Thought content does not include homicidal or suicidal plan.        Cognition and Memory: Cognition normal.        Judgment: Judgment is not impulsive.    Review of Systems  Constitutional:  Negative for diaphoresis, fever and malaise/fatigue.  HENT:  Negative for congestion.   Eyes:  Negative for pain and discharge.  Respiratory:  Negative for cough and shortness of breath.   Cardiovascular:  Negative for chest pain and palpitations.  Gastrointestinal:  Negative for diarrhea, nausea and vomiting.  Neurological:  Negative for dizziness, seizures, loss of consciousness, weakness and headaches.  Psychiatric/Behavioral:  Positive for depression and substance abuse. Negative for hallucinations and suicidal ideas. The patient is not nervous/anxious.     Blood pressure (!) 163/99, pulse 65, temperature 98.5 F (36.9 C), temperature source Oral, resp. rate 20, SpO2 98 %. There is no height or weight on file to calculate BMI.  Past Psychiatric History: The patient reports being diagnosed with Bipolar depression and alcohol abuse.  Is the patient at risk to self? No  Has the patient been a risk to self in the past 6 months? No .    Has the patient been a risk to self within the distant past? No   Is the patient a risk to others? No   Has the patient been a risk to others in the past 6 months? No    Has the patient been a risk to others within the distant past? No   Past Medical History:  Past Medical History:  Diagnosis Date   Anxiety    Arthritis    Bipolar 1 disorder ([place])    Blood transfusion without reported diagnosis    Cataract    Chronic pain    Depressed bipolar disorder ([place])    Depression    Diabetes mellitus without complication ([place])    Diabetic neuropathy ([place])    GERD (gastroesophageal reflux disease)    Herpes zoster 12/05/2008   Qualifier: Diagnosis of  By: Ronnald Ramp MD, Arvid Right.    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  Coupled with high doses of loperamide used for pain control.  Unintentional overdose -intractable VT leading to cardiogenic shock - ECMO   Hyperlipidemia    Hypertension    Neuromuscular disorder ([place])    nerve damage back, neck, and shoulder   Neuropathy in diabetes ([place])    Sleep apnea    has CPAP but cannot use it   Substance abuse ([place])    in past     Past Surgical History:  Procedure Laterality Date   COLONOSCOPY     EXTRACORPOREAL CIRCULATION  11/2015   FOR Cardiogenic shock related to intractable [place] VT storm (prolonged QT from drug toxixcity)    INTRAOPERATIVE TRANSESOPHAGEAL ECHOCARDIOGRAM N/A 11/26/2016   Procedure: INTRAOPERATIVE TRANSESOPHAGEAL ECHOCARDIOGRAM;  Surgeon: Ivin Poot, MD;  Location: [facility];  Service: Open Heart Surgery;  Laterality: N/A;   IR ANGIOGRAM EXTREMITY LEFT  06/05/2020   IR PTA NON CORO-LOWER EXTREM  06/05/2020   IR RADIOLOGIST EVAL & MGMT  05/21/2020   IR US GUIDE VASC ACCESS LEFT  06/05/2020   POLYPECTOMY     SHOULDER ARTHROSCOPY WITH ROTATOR CUFF REPAIR Right 2009   SPINE SURGERY  2010   TRANSTHORACIC ECHOCARDIOGRAM  07/2016; 12/10/2016   a. Prior to VT arrest: normal. EF 55-60%. Gr 1 DD.  mild LVH. Mildly dilated Aortic Root.;; b.  Normal LV size and function.  Mild LVH.  EF 55%.  No RWMA.  No valve  abnormalities.    Family History:  Family History  Problem Relation Age of Onset   Diabetes Mother    Hyperlipidemia Mother    Heart disease  Father    Colon cancer Neg Hx    Colon polyps Neg Hx    Esophageal cancer Neg Hx    Rectal cancer Neg Hx    Stomach cancer Neg Hx     Social History:  Social History   Socioeconomic History   Marital status: Divorced    Spouse name: Not on file   Number of children: 3   Years of education: Not on file   Highest education level: Not on file  Occupational History   Occupation: Disabled  Tobacco Use   Smoking status: Never   Smokeless tobacco: Never  Vaping Use   Vaping Use: Never used  Substance and Sexual Activity   Alcohol use: Not Currently   Drug use: No   Sexual activity: Not Currently  Other Topics Concern   Not on file  Social History Narrative   ** Merged History Encounter **       Social Determinants of Health   Financial Resource Strain: Not on file  Food Insecurity: Not on file  Transportation Needs: Not on file  Physical Activity: Not on file  Stress: Not on file  Social Connections: Not on file  Intimate Partner Violence: Not on file    SDOH:  SDOH Screenings   Alcohol Screen: Not on file  Depression (PHQ2-9): Medium Risk (01/25/2021)   Depression (PHQ2-9)    PHQ-2 Score: 11  Financial Resource Strain: Not on file  Food Insecurity: Not on file  Housing: Not on file  Physical Activity: Not on file  Social Connections: Not on file  Stress: Not on file  Tobacco Use: Low Risk  (01/25/2021)   Patient History    Smoking Tobacco Use: Never    Smokeless Tobacco Use: Never    Passive Exposure: Not on file  Transportation Needs: Not on file    Last Labs:  Admission on 06/20/2021  Component Date Value Ref Range Status   POC Amphetamine UR 06/20/2021 None Detected  NONE DETECTED (Cut Off Level 1000 ng/mL) Final   POC Secobarbital (BAR) 06/20/2021 None Detected  NONE DETECTED (Cut Off Level 300 ng/mL)  Final   POC Buprenorphine (BUP) 06/20/2021 Positive (A)  NONE DETECTED (Cut Off Level 10 ng/mL) Final   POC Oxazepam (BZO) 06/20/2021 None Detected  NONE DETECTED (Cut Off Level 300 ng/mL) Final   POC Cocaine UR 06/20/2021 None Detected  NONE DETECTED (Cut Off Level 300 ng/mL) Final   POC Methamphetamine UR 06/20/2021 None Detected  NONE DETECTED (Cut Off Level 1000 ng/mL) Final   POC Morphine 06/20/2021 None Detected  NONE DETECTED (Cut Off Level 300 ng/mL) Final   POC Methadone UR 06/20/2021 None Detected  NONE DETECTED (Cut Off Level 300 ng/mL) Final   POC Oxycodone UR 06/20/2021 None Detected  NONE DETECTED (Cut Off Level 100 ng/mL) Final   POC Marijuana UR 06/20/2021 None Detected  NONE DETECTED (Cut Off Level 50 ng/mL) Final   Glucose-Capillary 06/20/2021 312 (H)  70 - 99 mg/dL Final   Glucose reference range applies only to samples taken after fasting for at least 8 hours.   SARSCOV2ONAVIRUS 2 AG 06/20/2021 NEGATIVE  NEGATIVE Final   Comment: (NOTE) SARS-CoV-2 antigen NOT DETECTED.   Negative results are presumptive.  Negative results do not preclude SARS-CoV-2 infection and should not be used as the sole basis for treatment or other patient management decisions, including infection  control decisions, particularly in the presence of clinical signs and  symptoms consistent with COVID-19, or in those who have been  in contact with the virus.  Negative results must be combined with clinical observations, patient history, and epidemiological information. The expected result is Negative.  Fact Sheet for Patients: HandmadeRecipes.com.cy  Fact Sheet for Healthcare Providers: FuneralLife.at  This test is not yet approved or cleared by the [place] FDA and  has been authorized for detection and/or diagnosis of SARS-CoV-2 by FDA under an Emergency Use Authorization (EUA).  This EUA will remain in effect (meaning this test can be used)  for the duration of  the COV                          ID-19 declaration under Section 564(b)(1) of the Act, 21 U.S.C. section 360bbb-3(b)(1), unless the authorization is terminated or revoked sooner.    Office Visit on 01/25/2021  Component Date Value Ref Range Status   Hemoglobin A1C 01/25/2021 9.6 (A)  4.0 - 5.6 % Final   TSH 01/25/2021 2.470  0.450 - 4.500 uIU/mL Final   Cholesterol, Total 01/25/2021 173  100 - 199 mg/dL Final   Triglycerides 01/25/2021 797 (HH)  0 - 149 mg/dL Final   HDL 01/25/2021 28 (L)  >39 mg/dL Final   VLDL Cholesterol Cal 01/25/2021 108 (H)  5 - 40 mg/dL Final   LDL Chol Calc (NIH) 01/25/2021 37  0 - 99 mg/dL Final   Chol/HDL Ratio 01/25/2021 6.2 (H)  0.0 - 5.0 ratio Final   Comment:                                   T. Chol/HDL Ratio                                             Men  Women                               1/2 Avg.Risk  3.4    3.3                                   Avg.Risk  5.0    4.4                                2X Avg.Risk  9.6    7.1                                3X Avg.Risk 23.4   11.0     Allergies: Heparin, Oxytetracycline, Del-mycin [erythromycin], Other, Sulfa antibiotics, and Sulfonamide derivatives  PTA Medications:  Prior to Admission medications   Medication Sig Start Date End Date Taking? Authorizing Provider  atorvastatin (LIPITOR) 80 MG tablet Take 1 tablet (80 mg total) by mouth daily. 01/26/21 07/24/21  Camillia Herter, NP  buprenorphine (SUBUTEX) 8 MG SUBL SL tablet Place 8 mg under the tongue every 8 (eight) hours.    [provider]  Dulaglutide (TRULICITY) 6.38 VF/6.4PP SOPN Inject 0.75 mg into the skin once a week. 01/25/21   Camillia Herter, NP  fenofibrate (TRICOR) 145 MG tablet Take  1 tablet (145 mg total) by mouth daily. 06/19/20   Nicolette Bang, MD  ferrous sulfate 325 (65 FE) MG EC tablet Take 1 tablet (325 mg total) by mouth daily with breakfast. 06/20/20 07/20/20  Nicolette Bang, MD   furosemide (LASIX) 20 MG tablet Take 1 tablet (20 mg total) by mouth daily for 7 days. 01/25/21 05/02/21  Camillia Herter, NP  hydrochlorothiazide (HYDRODIURIL) 25 MG tablet TAKE 1 TABLET(25 MG) BY MOUTH DAILY 01/25/21   Camillia Herter, NP  Lancets (FREESTYLE) lancets Use as instructed 05/24/14   [provider]  losartan (COZAAR) 100 MG tablet TAKE 1 TABLET(100 MG) BY MOUTH DAILY 01/25/21   Camillia Herter, NP  metFORMIN (GLUCOPHAGE-XR) 500 MG 24 hr tablet Take 2 tablets (1,000 mg total) by mouth 2 (two) times daily. 01/25/21 07/24/21  Camillia Herter, NP  omeprazole (PRILOSEC) 40 MG capsule Take 1 capsule (40 mg total) by mouth at bedtime. 06/19/20   Nicolette Bang, MD  ondansetron (ZOFRAN) 4 MG tablet Take 1 tablet (4 mg total) by mouth every 8 (eight) hours as needed for nausea or vomiting. 04/24/20   Felipa Furnace, DPM  potassium chloride SA (KLOR-CON) 20 MEQ tablet Take 1 tablet (20 mEq total) by mouth daily. 06/20/20   Nicolette Bang, MD  prazosin (MINIPRESS) 2 MG capsule Take 2 mg by mouth at bedtime.    [provider]  QUEtiapine (SEROQUEL) 300 MG tablet Take 150 mg by mouth at bedtime.    [provider]  sertraline (ZOLOFT) 100 MG tablet Take 100 mg by mouth daily. 04/09/20   [provider]  Syringe/Needle, Disp, 30G X 1/2" 1 ML MISC UAD - use as directed 12/29/18   Ladell Pier, MD  vitamin B-12 (CYANOCOBALAMIN) 1000 MCG tablet Take 1 tablet (1,000 mcg total) by mouth daily. 04/28/20   Florencia Reasons, MD    Medical Decision Making  Recommend for admission to observation for crisis stabilization and medication management.  Initiate CIWA protocol -lorazepam 1 mg every 6 hours prn for CIWA >10 -thiamine 100 mg daily for nutritional supplementation -hydroxyzine 25 mg every 6 hours prn for anxiety, CIWA < or = 10 -ondansetron 4 mg ODT every 6 hours prn nausea/vomiting -loperamide 2-4 mg capsule prn diarrhea or loose stools    Lab Orders          Resp Panel by RT-PCR (Flu A&B, Covid) Anterior Nasal Swab         CBC with Differential/Platelet         Comprehensive metabolic panel         Hemoglobin A1c         Ethanol         Lipid panel         TSH         Glucose, capillary         CBG monitoring         POCT Urine Drug Screen - (I-Screen)         POC SARS Coronavirus 2 Ag      EKG  Scheduled Meds:  atorvastatin  80 mg Oral Daily   furosemide  20 mg Oral Daily   insulin aspart  0-5 Units Subcutaneous QHS   insulin aspart  0-6 Units Subcutaneous TID WC   losartan  25 mg Oral Daily   metFORMIN  1,000 mg Oral BID WC   multivitamin with minerals  1 tablet Oral Daily   pantoprazole  40 mg Oral Daily   prazosin  2 mg Oral QHS   QUEtiapine  150 mg Oral QHS   sertraline  100 mg Oral Daily   thiamine  100 mg Oral Daily   Continuous Infusions: PRN Meds:.acetaminophen, alum & mag hydroxide-simeth, hydrOXYzine, loperamide, LORazepam, magnesium hydroxide   (Zofran ODT) disintegrating tablet 4 mg ordered for N/V.    Recommendations  Based on my evaluation the patient does not appear to have an emergency medical condition. CIWA protocol. Recommend continuous observation for crisis stabilization.   Randon Goldsmith, NP 06/20/21  11:28 PM

## 2021-06-20 NOTE — ED Triage Notes (Signed)
Pt presents to [facility] voluntarily, unaccompanied with complaints of excessive drinking and feels like he has to drink so that he does not feel sick. Pt reports drinking about two months ago and has drank daily since then. Pt stated " I started drinking to ease tension, but now I do it so I dont' get sick". Pt reports that he has had recent deaths in the family that has caused him to resort to drinking again. Pt has had about a half pint of bourbon today. Pt also reports that his parents passed away last year and he still struggles with grief. Pt has hx of bipolar and depression and is prescribed Seroquel and Sertraline. Pt reports being compliant with medications. Pt denies SI, HI, AVH.

## 2021-06-21 DIAGNOSIS — Z20822 Contact with and (suspected) exposure to covid-19: Secondary | ICD-10-CM | POA: Diagnosis not present

## 2021-06-21 DIAGNOSIS — F4321 Adjustment disorder with depressed mood: Secondary | ICD-10-CM | POA: Diagnosis not present

## 2021-06-21 DIAGNOSIS — F3131 Bipolar disorder, current episode depressed, mild: Secondary | ICD-10-CM

## 2021-06-21 DIAGNOSIS — F1020 Alcohol dependence, uncomplicated: Secondary | ICD-10-CM | POA: Diagnosis not present

## 2021-06-21 LAB — RESP PANEL BY RT-PCR (FLU A&B, COVID) ARPGX2
Influenza A by PCR: NEGATIVE
Influenza B by PCR: NEGATIVE
SARS Coronavirus 2 by RT PCR: NEGATIVE

## 2021-06-21 LAB — GLUCOSE, CAPILLARY: Glucose-Capillary: 282 mg/dL — ABNORMAL HIGH (ref 70–99)

## 2021-06-21 MED ORDER — ONDANSETRON 4 MG PO TBDP
4.0000 mg | ORAL_TABLET | Freq: Once | ORAL | Status: AC
  Administered 2021-06-21: 4 mg via ORAL
  Filled 2021-06-21: qty 1

## 2021-06-21 MED ORDER — INSULIN ASPART 100 UNIT/ML IJ SOLN: 0.0000 [IU] | Freq: Three times a day (TID) | INTRAMUSCULAR | Status: DC

## 2021-06-21 MED ORDER — INSULIN ASPART 100 UNIT/ML IJ SOLN: 0.0000 [IU] | Freq: Every day | INTRAMUSCULAR | Status: DC

## 2021-06-21 NOTE — ED Notes (Signed)
Patient discharged with all belongings and AVS, education provided on next steps.

## 2021-06-21 NOTE — ED Notes (Signed)
Pt is a 61 y.o caucasian male who presents to [facility] voluntarily, with complaints of excessive drinking.  Pt has hx of bipolar and depression and is prescribed Seroquel and Sertraline. Pt reports relapsing about two months ago and has drank daily since then. Pt stated " I started drinking to ease tension, but now I do it so I dont' get sick". Pt reports that he has had recent deaths in the family that has caused him to resort to drinking again. Pt had about a half pint of bourbon yesterday before coming to [facility]. Pt also reports that his parents passed away last year and he still struggles with grief. Pt reports being compliant with medications. Pt denies SI, HI, AVH. Pt received Zofran for nausea and was effective.No othe s/s of nausea noted. Patient in bed appears asleep at this time.

## 2021-06-21 NOTE — ED Notes (Signed)
Pt reports nausea and loose stools PRN medication provided.

## 2021-06-21 NOTE — ED Provider Notes (Addendum)
FBC/OBS ASAP Discharge Summary  Date and Time: 06/21/2021 10:52 AM  Name: [name] [name]  MRN:  [id number]   Discharge Diagnoses:  Final diagnoses:  Alcohol use disorder, severe, dependence ([place])  Bipolar 1 disorder, depressed, mild ([place])  Grief   Subjective:   Pt assessed face to face by nurse practitioner.  He reports presenting to this facility last night due to alcohol use. Reports heaviest use was when he was drinking 1/5 of bourbon. Reports in the past 3 weeks he has cut down on his use and been taking "one swallow" per day in the mornings.   He reports current euthymic mood. He denies depressed or anxious mood. He denies SI/VI/HI. He denies AVH, paranoia. He denies any physical complaints, including fever, chills, malaise/fatigue, diaphoresis, HA, dizziness, weakness, LOC, blurry vision, CP, palpitations, SOB. He reports "I feel better than I have in a long time. I feel normal today." He verbalizes readiness for discharge. States "I really need to go home and go to [place]". 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  He denies SI/VI/HI. He denies AVH, paranoia.   He denies hx of NSSI. He reports hx of 1 SA in 2014, OD on medications.  He reports he is receiving psychiatric medication management for bipolar II disorder at [facility]. He denies current counseling. States he is active in [employer], which is a support system for him, and he speaks w/ his pastor.   He reports he is living alone.  He reports he is currently unemployed, he is retired and on disability.  He denies access to a firearm.  He denies hx of seizures or DTs.  Updated B/P = 178/94, CBG =  282. Pt reports chronically elevated B/P and glucose levels. Provided psychoeducation on hyperglycemia, hypertension, and alcohol withdrawal. Discussed he should present to the emergency room for an evaluation should condition worsen. Discussed would also provide information  sheets in discharge paperwork. Pt verbalized understanding of and agreed to strict return precautions. States he had a few days of missed medications. Discussed importance of taking medications as prescribed by his providers. Pt verbalized understanding and agrees to do so.  Pt verbally contracts to safety.   Pt requesting discharge. Per pt, has established psychiatric care at [facility] as well as primary care at [facility]. Discussed follow up w/ medication management w/ his current psychiatric provider at [facility] as well as w/ his primary care provider at [facility]. Pt verbalized understanding and agrees to do so.  Pt is a&ox3, in no acute distress, non-toxic appearing. He appears casually dressed, fairly groomed, appropriate for environment. Eye contact is good. Speech is clear and coherent, w/ nml rate and volume. Reported mood is euthymic. Affect is congruent, full range. TP is coherent, goal directed, linear. Description of associations is intact. TC is logical. There is no evidence of internal preoccupation, agitation, aggression or distractibility. No delusions elicited. Pt is calm, cooperative, pleasant.  Stay Summary:  Pt is a 61 y/o male w/ reported hx of bipolar II disorder presenting to [facility] on 06/20/21 for alcohol use. On reassessment today, he denies any physical complaints. He is requesting discharge. Pt discharged home.  Total Time spent with patient: 30 minutes  Past Psychiatric History: Pt w/ reported hx of bipolar II disorder.  Past Medical History:  Past Medical History:  Diagnosis Date   Anxiety    Arthritis    Bipolar 1 disorder ([place])  Blood transfusion without reported diagnosis    Cataract    Chronic pain    Depressed bipolar disorder ([place])    Depression    Diabetes mellitus without complication ([place])    Diabetic neuropathy ([place])    GERD (gastroesophageal reflux disease)    Herpes zoster 12/05/2008   Qualifier: Diagnosis of  By: Ronnald Ramp MD,  Arvid Right.    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  Coupled with high doses of loperamide used for pain control.  Unintentional overdose -intractable VT leading to cardiogenic shock - ECMO   Hyperlipidemia    Hypertension    Neuromuscular disorder ([place])    nerve damage back, neck, and shoulder   Neuropathy in diabetes ([place])    Sleep apnea    has CPAP but cannot use it   Substance abuse ([place])    in past     Past Surgical History:  Procedure Laterality Date   COLONOSCOPY     EXTRACORPOREAL CIRCULATION  11/2015   FOR Cardiogenic shock related to intractable [place] VT storm (prolonged QT from drug toxixcity)    INTRAOPERATIVE TRANSESOPHAGEAL ECHOCARDIOGRAM N/A 11/26/2016   Procedure: INTRAOPERATIVE TRANSESOPHAGEAL ECHOCARDIOGRAM;  Surgeon: Ivin Poot, MD;  Location: [facility];  Service: Open Heart Surgery;  Laterality: N/A;   IR ANGIOGRAM EXTREMITY LEFT  06/05/2020   IR PTA NON CORO-LOWER EXTREM  06/05/2020   IR RADIOLOGIST EVAL & MGMT  05/21/2020   IR US GUIDE VASC ACCESS LEFT  06/05/2020   POLYPECTOMY     SHOULDER ARTHROSCOPY WITH ROTATOR CUFF REPAIR Right 2009   SPINE SURGERY  2010   TRANSTHORACIC ECHOCARDIOGRAM  07/2016; 12/10/2016   a. Prior to VT arrest: normal. EF 55-60%. Gr 1 DD.  mild LVH. Mildly dilated Aortic Root.;; b.  Normal LV size and function.  Mild LVH.  EF 55%.  No RWMA.  No valve abnormalities.   Family History:  Family History  Problem Relation Age of Onset   Diabetes Mother    Hyperlipidemia Mother    Heart disease Father    Colon cancer Neg Hx    Colon polyps Neg Hx    Esophageal cancer Neg Hx    Rectal cancer Neg Hx    Stomach cancer Neg Hx    Family Psychiatric History: Reports 5 maternal uncles, paternal and maternal grandfathers carried dx of alcohol use disorder. Reports daughter w/ hx of SA. Reports nephew w/ hx of completed SA. Social History:  Social History    Substance and Sexual Activity  Alcohol Use Not Currently     Social History   Substance and Sexual Activity  Drug Use No    Social History   Socioeconomic History   Marital status: Divorced    Spouse name: Not on file   Number of children: 3   Years of education: Not on file   Highest education level: Not on file  Occupational History   Occupation: Disabled  Tobacco Use   Smoking status: Never   Smokeless tobacco: Never  Vaping Use   Vaping Use: Never used  Substance and Sexual Activity   Alcohol use: Not Currently   Drug use: No   Sexual activity: Not Currently  Other Topics Concern   Not on file  Social History Narrative   ** Merged History Encounter **       Social Determinants of Health   Financial Resource Strain: Not on file  Food  Insecurity: Not on file  Transportation Needs: Not on file  Physical Activity: Not on file  Stress: Not on file  Social Connections: Not on file   SDOH:  SDOH Screenings   Alcohol Screen: Not on file  Depression (PHQ2-9): Medium Risk (01/25/2021)   Depression (PHQ2-9)    PHQ-2 Score: 11  Financial Resource Strain: Not on file  Food Insecurity: Not on file  Housing: Not on file  Physical Activity: Not on file  Social Connections: Not on file  Stress: Not on file  Tobacco Use: Low Risk  (01/25/2021)   Patient History    Smoking Tobacco Use: Never    Smokeless Tobacco Use: Never    Passive Exposure: Not on file  Transportation Needs: Not on file    Tobacco Cessation:  N/A, patient does not currently use tobacco products  Current Medications:  Current Facility-Administered Medications  Medication Dose Route Frequency Provider Last Rate Last Admin   acetaminophen (TYLENOL) tablet 650 mg  650 mg Oral Q6H PRN Onuoha, Chinwendu V, NP       alum & mag hydroxide-simeth (MAALOX/MYLANTA) 200-200-20 MG/5ML suspension 30 mL  30 mL Oral Q4H PRN Onuoha, Chinwendu V, NP       atorvastatin (LIPITOR) tablet 80 mg  80 mg Oral Daily  Onuoha, Chinwendu V, NP   80 mg at 06/21/21 0815   furosemide (LASIX) tablet 20 mg  20 mg Oral Daily Onuoha, Chinwendu V, NP   20 mg at 06/21/21 0815   hydrOXYzine (ATARAX) tablet 25 mg  25 mg Oral Q6H PRN Onuoha, Chinwendu V, NP   25 mg at 06/21/21 0018   insulin aspart (novoLOG) injection 0-5 Units  0-5 Units Subcutaneous QHS Onuoha, Chinwendu V, NP       insulin aspart (novoLOG) injection 0-6 Units  0-6 Units Subcutaneous TID WC Onuoha, Chinwendu V, NP       loperamide (IMODIUM) capsule 2-4 mg  2-4 mg Oral PRN Onuoha, Chinwendu V, NP   2 mg at 06/21/21 0019   LORazepam (ATIVAN) tablet 1 mg  1 mg Oral Q6H PRN Onuoha, Chinwendu V, NP       losartan (COZAAR) tablet 25 mg  25 mg Oral Daily Onuoha, Chinwendu V, NP   25 mg at 06/21/21 0814   magnesium hydroxide (MILK OF MAGNESIA) suspension 30 mL  30 mL Oral Daily PRN Onuoha, Chinwendu V, NP       metFORMIN (GLUCOPHAGE-XR) 24 hr tablet 1,000 mg  1,000 mg Oral BID WC Onuoha, Chinwendu V, NP   1,000 mg at 06/21/21 0813   multivitamin with minerals tablet 1 tablet  1 tablet Oral Daily Onuoha, Chinwendu V, NP   1 tablet at 06/21/21 0819   pantoprazole (PROTONIX) EC tablet 40 mg  40 mg Oral Daily Onuoha, Chinwendu V, NP   40 mg at 06/21/21 0815   prazosin (MINIPRESS) capsule 2 mg  2 mg Oral QHS Onuoha, Chinwendu V, NP   2 mg at 06/21/21 0018   QUEtiapine (SEROQUEL) tablet 150 mg  150 mg Oral QHS Onuoha, Chinwendu V, NP   150 mg at 06/21/21 0018   sertraline (ZOLOFT) tablet 100 mg  100 mg Oral Daily Onuoha, Chinwendu V, NP   100 mg at 06/21/21 0814   thiamine tablet 100 mg  100 mg Oral Daily Onuoha, Chinwendu V, NP   100 mg at 06/21/21 1191   Current Outpatient Medications  Medication Sig Dispense Refill   atorvastatin (LIPITOR) 80 MG tablet Take 1 tablet (80 mg  total) by mouth daily. 120 tablet 0   buprenorphine (SUBUTEX) 8 MG SUBL SL tablet Place 8 mg under the tongue every 8 (eight) hours.     Dulaglutide (TRULICITY) 9.79 GX/2.1JH SOPN Inject 0.75 mg  into the skin once a week. (Patient taking differently: Inject 0.75 mg into the skin every Monday.) 2 mL 2   fenofibrate (TRICOR) 145 MG tablet Take 1 tablet (145 mg total) by mouth daily. 90 tablet 1   furosemide (LASIX) 20 MG tablet Take 1 tablet (20 mg total) by mouth daily for 7 days. (Patient taking differently: Take 20 mg by mouth daily as needed for fluid or edema.) 7 tablet 0   hydrochlorothiazide (HYDRODIURIL) 25 MG tablet TAKE 1 TABLET(25 MG) BY MOUTH DAILY (Patient taking differently: Take 25 mg by mouth daily.) 90 tablet 0   Lancets (FREESTYLE) lancets Use as instructed     losartan (COZAAR) 100 MG tablet TAKE 1 TABLET(100 MG) BY MOUTH DAILY (Patient taking differently: Take 100 mg by mouth daily.) 90 tablet 0   metFORMIN (GLUCOPHAGE-XR) 500 MG 24 hr tablet Take 2 tablets (1,000 mg total) by mouth 2 (two) times daily. (Patient taking differently: Take 500 mg by mouth in the morning, at noon, in the evening, and at bedtime.) 360 tablet 0   omeprazole (PRILOSEC OTC) 20 MG tablet Take 40 mg by mouth daily.     prazosin (MINIPRESS) 2 MG capsule Take 2 mg by mouth at bedtime.     QUEtiapine (SEROQUEL) 300 MG tablet Take 150 mg by mouth at bedtime.     sertraline (ZOLOFT) 100 MG tablet Take 100 mg by mouth daily.     Syringe/Needle, Disp, 30G X 1/2" 1 ML MISC UAD - use as directed 100 each 5    PTA Medications: (Not in a hospital admission)      01/25/2021    8:27 AM 10/22/2020   10:43 AM 09/19/2020    3:19 PM  Depression screen PHQ 2/9  Decreased Interest 3 0 0  Down, Depressed, Hopeless 3 0 0  PHQ - 2 Score 6 0 0  Altered sleeping 2 0   Tired, decreased energy 1 1   Change in appetite 0 0   Feeling bad or failure about yourself  1 0   Trouble concentrating 1 1   Moving slowly or fidgety/restless 0 0   Suicidal thoughts 0 0   PHQ-9 Score 11 2   Difficult doing work/chores Somewhat difficult      Flowsheet Row ED from 06/20/2021 in [facility] ED  from 12/06/2020 in [facility] ED to Hosp-Admission (Discharged) from 04/26/2020 in [facility] No Risk No Risk No Risk       Musculoskeletal  Strength & Muscle Tone: within normal limits Gait & Station: normal Patient leans: N/A  Psychiatric Specialty Exam  Presentation  General Appearance: Appropriate for Environment; Casual; Fairly Groomed  Eye Contact:Good  Speech:Clear and Coherent; Normal Rate  Speech Volume:Normal  Handedness:Right   Mood and Affect  Mood:Euthymic  Affect:Congruent; Full Range   Thought Process  Thought Processes:Coherent; Goal Directed; Linear  Descriptions of Associations:Intact  Orientation:Full (Time, Place and Person)  Thought Content:Logical     Hallucinations:Hallucinations: None  Ideas of Reference:None  Suicidal Thoughts:Suicidal Thoughts: No  Homicidal Thoughts:Homicidal Thoughts: No   Sensorium  Memory:Immediate Good; Recent Good; Remote Good  Judgment:Intact  Insight:Fair   Executive Functions  Concentration:Fair  Attention  Span:Fair  [place]   Psychomotor Activity  Psychomotor Activity:Psychomotor Activity: Normal   Assets  Assets:Communication Skills; Desire for Improvement; Financial Resources/Insurance; Housing; Social Support   Sleep  Sleep:Sleep: Fair   Nutritional Assessment (For OBS and FBC admissions only) Has the patient had a weight loss or gain of 10 pounds or more in the last 3 months?: No Has the patient had a decrease in food intake/or appetite?: No Does the patient have dental problems?: No Does the patient have eating habits or behaviors that may be indicators of an eating disorder including binging or inducing vomiting?: No Has the patient recently lost weight without trying?: 0 Has the patient been eating poorly because of a decreased  appetite?: 0 Malnutrition Screening Tool Score: 0   Physical Exam  Physical Exam Cardiovascular:     Rate and Rhythm: Normal rate.  Pulmonary:     Effort: Pulmonary effort is normal.  Neurological:     Mental Status: He is alert and oriented to person, place, and time.    Review of Systems  Constitutional:  Negative for chills, diaphoresis, fever and malaise/fatigue.  Eyes:  Negative for blurred vision.  Respiratory:  Negative for shortness of breath.   Cardiovascular:  Negative for chest pain and palpitations.  Gastrointestinal:  Negative for abdominal pain and nausea.  Neurological:  Negative for dizziness, loss of consciousness, weakness and headaches.  Psychiatric/Behavioral:  Positive for substance abuse. Negative for depression, hallucinations and suicidal ideas. The patient is not nervous/anxious.    Blood pressure (!) 178/94, pulse 85, temperature 98.5 F (36.9 C), temperature source Oral, resp. rate 18, SpO2 99 %. There is no height or weight on file to calculate BMI.  Demographic Factors:  Male, Caucasian, Living alone, and Unemployed  Loss Factors: Loss of significant relationship  Historical Factors: Prior suicide attempts and Family history of suicide  Risk Reduction Factors:   Religious beliefs about death and Positive social support  Continued Clinical Symptoms:  Previous Psychiatric Diagnoses and Treatments  Cognitive Features That Contribute To Risk:  None    Suicide Risk:  Minimal: No identifiable suicidal ideation.  Patients presenting with no risk factors but with morbid ruminations; may be classified as minimal risk based on the severity of the depressive symptoms  Plan Of Care/Follow-up recommendations:  Pt is recommended to follow up with medication management w/ his current psychiatric provider at [facility]. Pt is recommended to follow up with his primary care provider at [facility].  Disposition:  Discharge  Tharon Aquas,  NP 06/21/2021, 10:52 AM

## 2021-06-21 NOTE — Discharge Instructions (Addendum)
Please follow up with medication management with your current psychiatric provider at [facility]. Please follow up with your primary care provider at [facility].  Patient is instructed prior to discharge to: Take all medications as prescribed by his/her mental healthcare provider. Report any adverse effects and or reactions from the medicines to his/her outpatient provider promptly. Keep all scheduled appointments, to ensure that you are getting refills on time and to avoid any interruption in your medication.  If you are unable to keep an appointment call to reschedule.  Be sure to follow-up with resources and follow-up appointments provided.  Patient has been instructed & cautioned: To not engage in alcohol and or illegal drug use while on prescription medicines. In the event of worsening symptoms, patient is instructed to call the crisis hotline, 911 and or go to the nearest ED for appropriate evaluation and treatment of symptoms. To follow-up with his/her primary care provider for your other medical issues, concerns and or health care needs.  In case of an urgent emergency, you have the option of contacting the Mobile Crisis Unit with [facility] at [phone].  Outpatient psychiatry, therapy and CD-IOP (morning and afternoon).  Accepts Medicaid.        [facility]      [place], [place] [address]      [phone] [address] [place], [place] [address]      [phone]       [place] [facility]      [address]      [place], [place] [address]      [phone]   [place] (women only at this campus) [place], [place] [address] [phone]  [facility] (men only at this campus) [place], [place] [address] [phone]  ((These programs listed above have a one-time application fee.))   [place] [address] [place] [address] [phone]  [place] [address] [place] [address] [phone] II [place] Bus[place] [address], [place] [address] [phone], [place] [address] [phone]  [facility] (Rehab for men only) [address] [place], [place] [address] [phone]   RESIDENTIAL TREATMENT- PRIVATE PAY:  [facility] [place], [place] [phone] Women's [place], [place] [phone]  MEDICAL DETOX/RESIDENTIAL TREATMENT- MEDICAID/IPRS:  [facility] [id number] [place], [place] [address] [phone]  [place] [address] [place], [place] [address] [phone]  [place] [place], [place] [address] [phone]  [place] [address] [place], [place] [address] [phone]  [place], [place] [address] [phone]  ((For admissions to these three [facility] facilities during weekday days and possibly other times, contact [place], phone: [phone]; fax: [phone]))  [facility] (detox for men and women) [place], [place] [address] [phone]  MEDICAL DETOX AND RESIDENTIAL TREATMENT - INSURANCE:  [facility] (also offers CD-IOP for graduates of residential program) [place]. [place],  [address] [phone]  [facility] (now accepting [place]) [address] [place]  [place] [address] [phone]  [facility] (accept [employer] for some services)  [address] [place], [place] [address] [phone]  OUTPATIENT PROGRAMS:  [facility] (ADS) [place], [place] [address] [phone]  ((CD-IOP is currently not operational; Opioid replacement clinic is operational))   [facility] (private insurance) [address] [address] [place], [place] [address] [phone]  ((CD-IOP (afternoon program); individual therapy))   [place] (Medicaid & IPRS for [facility] residents) [place], [place] [address] [phone]  ((CD-IOP; new clients must go through walk-in clinic; NOT CURRENTLY OPERATIONAL))   [employer] (Medicaid & IPRS for [facility] residents) [place]. [place], [place] [address] [phone]  ((CD-IOP))   [facility] [place] [facility] (Medicaid for [facility] and [employer]; some private insurance) [address], [place] [address] [phone]  ((CD-IOP))   [facility] ([place] insurance) [phone] [address] [place], [place] [address] [phone]  ((CD-IOP (morning & evening programs); individual therapy))   HALFWAY HOUSES:  [facility] [phone]  [email or web address]   OPIOID REPLACEMENT PROVIDERS:  [facility] (ADS) [place], [place] [address] [phone]  [place] [address],  [address] [phone]  [facility] [address] [place],  [address] [phone]   12 STEP PROGRAMS:  Alcoholics Anonymous of [place] [email or web address]  Narcotics Anonymous of [place] [email or web address]  Al-Anon of [place], [place] [email or web address]/find-[email or web address]  Nar-Anon https://nar-anon.org/find-[email or web address]-meetin

## 2021-06-21 NOTE — BH Assessment (Signed)
LCSW Progress Note  Per Elvin So, NP, this pt does not require psychiatric hospitalization at this time.  Pt is psychiatrically cleared.  Discharge instructions include reminders for follow-up appointments at [facility] and [facility] along with outpatient therapeutic resources for mental health.  Elvin So, NP, has been notified.  Omelia [place], MSW, [place] [phone] or [phone]

## 2021-06-24 ENCOUNTER — Encounter: Payer: Medicare Other | Admitting: Family

## 2021-06-24 DIAGNOSIS — E1142 Type 2 diabetes mellitus with diabetic polyneuropathy: Secondary | ICD-10-CM

## 2021-06-24 DIAGNOSIS — E782 Mixed hyperlipidemia: Secondary | ICD-10-CM

## 2021-06-24 DIAGNOSIS — I10 Essential (primary) hypertension: Secondary | ICD-10-CM

## 2021-06-24 NOTE — Progress Notes (Signed)
This encounter was created in error - please disregard.

## 2021-06-30 ENCOUNTER — Other Ambulatory Visit: Payer: Self-pay

## 2021-06-30 ENCOUNTER — Encounter (HOSPITAL_COMMUNITY): Payer: Self-pay

## 2021-06-30 ENCOUNTER — Inpatient Hospital Stay (HOSPITAL_COMMUNITY): Payer: Medicare Other

## 2021-06-30 ENCOUNTER — Inpatient Hospital Stay (HOSPITAL_COMMUNITY)
Admission: EM | Admit: 2021-06-30 | Discharge: 2021-07-03 | DRG: 897 | Disposition: A | Discharge status: Home / Self Care | Payer: Medicare Other | Attending: Internal Medicine | Admitting: Internal Medicine

## 2021-06-30 DIAGNOSIS — Z8674 Personal history of sudden cardiac arrest: Secondary | ICD-10-CM | POA: Diagnosis not present

## 2021-06-30 DIAGNOSIS — F319 Bipolar disorder, unspecified: Secondary | ICD-10-CM | POA: Diagnosis present

## 2021-06-30 DIAGNOSIS — Z7984 Long term (current) use of oral hypoglycemic drugs: Secondary | ICD-10-CM | POA: Diagnosis not present

## 2021-06-30 DIAGNOSIS — Z882 Allergy status to sulfonamides status: Secondary | ICD-10-CM | POA: Diagnosis not present

## 2021-06-30 DIAGNOSIS — Z1152 Encounter for screening for COVID-19: Secondary | ICD-10-CM | POA: Diagnosis not present

## 2021-06-30 DIAGNOSIS — E1165 Type 2 diabetes mellitus with hyperglycemia: Secondary | ICD-10-CM | POA: Diagnosis not present

## 2021-06-30 DIAGNOSIS — Z8249 Family history of ischemic heart disease and other diseases of the circulatory system: Secondary | ICD-10-CM

## 2021-06-30 DIAGNOSIS — E1142 Type 2 diabetes mellitus with diabetic polyneuropathy: Secondary | ICD-10-CM

## 2021-06-30 DIAGNOSIS — F10930 Alcohol use, unspecified with withdrawal, uncomplicated: Secondary | ICD-10-CM | POA: Diagnosis not present

## 2021-06-30 DIAGNOSIS — Z79899 Other long term (current) drug therapy: Secondary | ICD-10-CM

## 2021-06-30 DIAGNOSIS — E860 Dehydration: Secondary | ICD-10-CM | POA: Diagnosis not present

## 2021-06-30 DIAGNOSIS — F32A Depression, unspecified: Secondary | ICD-10-CM | POA: Diagnosis not present

## 2021-06-30 DIAGNOSIS — M1990 Unspecified osteoarthritis, unspecified site: Secondary | ICD-10-CM | POA: Diagnosis not present

## 2021-06-30 DIAGNOSIS — F1120 Opioid dependence, uncomplicated: Secondary | ICD-10-CM | POA: Diagnosis present

## 2021-06-30 DIAGNOSIS — Z794 Long term (current) use of insulin: Secondary | ICD-10-CM | POA: Diagnosis not present

## 2021-06-30 DIAGNOSIS — Z833 Family history of diabetes mellitus: Secondary | ICD-10-CM

## 2021-06-30 DIAGNOSIS — F10939 Alcohol use, unspecified with withdrawal, unspecified: Secondary | ICD-10-CM

## 2021-06-30 DIAGNOSIS — K219 Gastro-esophageal reflux disease without esophagitis: Secondary | ICD-10-CM | POA: Diagnosis present

## 2021-06-30 DIAGNOSIS — F10239 Alcohol dependence with withdrawal, unspecified: Secondary | ICD-10-CM | POA: Diagnosis present

## 2021-06-30 DIAGNOSIS — R945 Abnormal results of liver function studies: Secondary | ICD-10-CM | POA: Diagnosis not present

## 2021-06-30 DIAGNOSIS — I10 Essential (primary) hypertension: Secondary | ICD-10-CM | POA: Diagnosis present

## 2021-06-30 DIAGNOSIS — F411 Generalized anxiety disorder: Secondary | ICD-10-CM

## 2021-06-30 DIAGNOSIS — Z7985 Long-term (current) use of injectable non-insulin antidiabetic drugs: Secondary | ICD-10-CM

## 2021-06-30 DIAGNOSIS — R160 Hepatomegaly, not elsewhere classified: Secondary | ICD-10-CM | POA: Diagnosis not present

## 2021-06-30 DIAGNOSIS — Y909 Presence of alcohol in blood, level not specified: Secondary | ICD-10-CM | POA: Diagnosis present

## 2021-06-30 DIAGNOSIS — E1140 Type 2 diabetes mellitus with diabetic neuropathy, unspecified: Secondary | ICD-10-CM | POA: Diagnosis present

## 2021-06-30 DIAGNOSIS — K760 Fatty (change of) liver, not elsewhere classified: Secondary | ICD-10-CM | POA: Diagnosis present

## 2021-06-30 DIAGNOSIS — F10139 Alcohol abuse with withdrawal, unspecified: Secondary | ICD-10-CM | POA: Diagnosis present

## 2021-06-30 DIAGNOSIS — Z8349 Family history of other endocrine, nutritional and metabolic diseases: Secondary | ICD-10-CM | POA: Diagnosis not present

## 2021-06-30 DIAGNOSIS — F1010 Alcohol abuse, uncomplicated: Principal | ICD-10-CM

## 2021-06-30 DIAGNOSIS — E7800 Pure hypercholesterolemia, unspecified: Secondary | ICD-10-CM | POA: Diagnosis not present

## 2021-06-30 DIAGNOSIS — R112 Nausea with vomiting, unspecified: Secondary | ICD-10-CM | POA: Diagnosis not present

## 2021-06-30 DIAGNOSIS — E782 Mixed hyperlipidemia: Secondary | ICD-10-CM

## 2021-06-30 DIAGNOSIS — E8729 Other acidosis: Secondary | ICD-10-CM

## 2021-06-30 LAB — RAPID URINE DRUG SCREEN, HOSP PERFORMED
Amphetamines: NOT DETECTED
Barbiturates: NOT DETECTED
Benzodiazepines: NOT DETECTED
Cocaine: NOT DETECTED
Opiates: NOT DETECTED
Tetrahydrocannabinol: NOT DETECTED

## 2021-06-30 LAB — COMPREHENSIVE METABOLIC PANEL
ALT: 73 U/L — ABNORMAL HIGH (ref 0–44)
AST: 153 U/L — ABNORMAL HIGH (ref 15–41)
Albumin: 3.9 g/dL (ref 3.5–5.0)
Alkaline Phosphatase: 57 U/L (ref 38–126)
Anion gap: 26 — ABNORMAL HIGH (ref 5–15)
BUN: 17 mg/dL (ref 6–20)
CO2: 11 mmol/L — ABNORMAL LOW (ref 22–32)
Calcium: 8.3 mg/dL — ABNORMAL LOW (ref 8.9–10.3)
Chloride: 103 mmol/L (ref 98–111)
Creatinine, Ser: 1.13 mg/dL (ref 0.61–1.24)
GFR, Estimated: >60 mL/min (ref >60)
Glucose, Bld: 229 mg/dL — ABNORMAL HIGH (ref 70–99)
Potassium: 3.9 mmol/L (ref 3.5–5.1)
Sodium: 140 mmol/L (ref 135–145)
Total Bilirubin: 0.9 mg/dL (ref 0.3–1.2)
Total Protein: 6.9 g/dL (ref 6.5–8.1)

## 2021-06-30 LAB — CBC WITH DIFFERENTIAL/PLATELET
Abs Immature Granulocytes: 0.05 10*3/uL (ref 0.00–0.07)
Basophils Absolute: 0.1 10*3/uL (ref 0.0–0.1)
Basophils Relative: 1 %
Eosinophils Absolute: 0 10*3/uL (ref 0.0–0.5)
Eosinophils Relative: 0 %
HCT: 41.7 % (ref 39.0–52.0)
Hemoglobin: 13.7 g/dL (ref 13.0–17.0)
Immature Granulocytes: 1 %
Lymphocytes Relative: 10 %
Lymphs Abs: 0.5 10*3/uL — ABNORMAL LOW (ref 0.7–4.0)
MCH: 28.1 pg (ref 26.0–34.0)
MCHC: 32.9 g/dL (ref 30.0–36.0)
MCV: 85.6 fL (ref 80.0–100.0)
Monocytes Absolute: 0.1 10*3/uL (ref 0.1–1.0)
Monocytes Relative: 3 %
Neutro Abs: 3.8 10*3/uL (ref 1.7–7.7)
Neutrophils Relative %: 85 %
Platelets: 106 10*3/uL — ABNORMAL LOW (ref 150–400)
RBC: 4.87 MIL/uL (ref 4.22–5.81)
RDW: 14.1 % (ref 11.5–15.5)
WBC: 4.5 10*3/uL (ref 4.0–10.5)
nRBC: 0 % (ref 0.0–0.2)

## 2021-06-30 LAB — ETHANOL: Alcohol, Ethyl (B): 61 mg/dL — ABNORMAL HIGH (ref <10)

## 2021-06-30 LAB — BASIC METABOLIC PANEL
Anion gap: 22 — ABNORMAL HIGH (ref 5–15)
BUN: 15 mg/dL (ref 6–20)
CO2: 14 mmol/L — ABNORMAL LOW (ref 22–32)
Calcium: 8.1 mg/dL — ABNORMAL LOW (ref 8.9–10.3)
Chloride: 103 mmol/L (ref 98–111)
Creatinine, Ser: 0.98 mg/dL (ref 0.61–1.24)
GFR, Estimated: >60 mL/min (ref >60)
Glucose, Bld: 214 mg/dL — ABNORMAL HIGH (ref 70–99)
Potassium: 4.1 mmol/L (ref 3.5–5.1)
Sodium: 139 mmol/L (ref 135–145)

## 2021-06-30 LAB — I-STAT VENOUS BLOOD GAS, ED
Acid-base deficit: 10 mmol/L — ABNORMAL HIGH (ref 0.0–2.0)
Bicarbonate: 14.9 mmol/L — ABNORMAL LOW (ref 20.0–28.0)
Calcium, Ion: 1.03 mmol/L — ABNORMAL LOW (ref 1.15–1.40)
HCT: 41 % (ref 39.0–52.0)
Hemoglobin: 13.9 g/dL (ref 13.0–17.0)
O2 Saturation: 100 %
Potassium: 4.2 mmol/L (ref 3.5–5.1)
Sodium: 138 mmol/L (ref 135–145)
TCO2: 16 mmol/L — ABNORMAL LOW (ref 22–32)
pCO2, Ven: 30.7 mmHg — ABNORMAL LOW (ref 44–60)
pH, Ven: 7.295 (ref 7.25–7.43)
pO2, Ven: 200 mmHg — ABNORMAL HIGH (ref 32–45)

## 2021-06-30 LAB — GLUCOSE, CAPILLARY: Glucose-Capillary: 255 mg/dL — ABNORMAL HIGH (ref 70–99)

## 2021-06-30 LAB — SARS CORONAVIRUS 2 BY RT PCR: SARS Coronavirus 2 by RT PCR: NEGATIVE

## 2021-06-30 LAB — MAGNESIUM: Magnesium: 2.1 mg/dL (ref 1.7–2.4)

## 2021-06-30 MED ORDER — ONDANSETRON HCL 4 MG/2ML IJ SOLN
4.0000 mg | Freq: Once | INTRAMUSCULAR | Status: AC
  Administered 2021-06-30: 4 mg via INTRAVENOUS
  Filled 2021-06-30: qty 2

## 2021-06-30 MED ORDER — HYDRALAZINE HCL 25 MG PO TABS
25.0000 mg | ORAL_TABLET | Freq: Four times a day (QID) | ORAL | Status: DC | PRN
  Administered 2021-06-30 – 2021-07-02 (×3): 25 mg via ORAL
  Filled 2021-06-30 (×3): qty 1

## 2021-06-30 MED ORDER — DIPHENHYDRAMINE HCL 50 MG/ML IJ SOLN
25.0000 mg | Freq: Once | INTRAMUSCULAR | Status: AC
  Administered 2021-06-30: 25 mg via INTRAVENOUS
  Filled 2021-06-30: qty 1

## 2021-06-30 MED ORDER — BUPRENORPHINE HCL 8 MG SL SUBL
8.0000 mg | SUBLINGUAL_TABLET | Freq: Three times a day (TID) | SUBLINGUAL | Status: DC
  Administered 2021-06-30 – 2021-07-03 (×8): 8 mg via SUBLINGUAL
  Filled 2021-06-30 (×8): qty 1

## 2021-06-30 MED ORDER — LORAZEPAM 2 MG/ML IJ SOLN
1.0000 mg | INTRAMUSCULAR | Status: DC | PRN
  Administered 2021-06-30 – 2021-07-01 (×5): 1 mg via INTRAVENOUS
  Filled 2021-06-30 (×5): qty 1

## 2021-06-30 MED ORDER — SERTRALINE HCL 100 MG PO TABS
100.0000 mg | ORAL_TABLET | Freq: Every day | ORAL | Status: DC
  Administered 2021-07-01 – 2021-07-03 (×3): 100 mg via ORAL
  Filled 2021-06-30 (×4): qty 1

## 2021-06-30 MED ORDER — METOCLOPRAMIDE HCL 5 MG/ML IJ SOLN
10.0000 mg | Freq: Four times a day (QID) | INTRAMUSCULAR | Status: DC | PRN
  Administered 2021-06-30 – 2021-07-02 (×4): 10 mg via INTRAVENOUS
  Filled 2021-06-30 (×4): qty 2

## 2021-06-30 MED ORDER — QUETIAPINE FUMARATE 25 MG PO TABS
150.0000 mg | ORAL_TABLET | Freq: Every day | ORAL | Status: DC
Start: 2021-06-30 — End: 2021-07-03
  Administered 2021-06-30 – 2021-07-02 (×3): 150 mg via ORAL
  Filled 2021-06-30 (×3): qty 6

## 2021-06-30 MED ORDER — LORAZEPAM 2 MG/ML IJ SOLN
1.0000 mg | Freq: Once | INTRAMUSCULAR | Status: AC
  Administered 2021-06-30: 1 mg via INTRAVENOUS
  Filled 2021-06-30: qty 1

## 2021-06-30 MED ORDER — METOCLOPRAMIDE HCL 5 MG/ML IJ SOLN
10.0000 mg | Freq: Once | INTRAMUSCULAR | Status: AC
  Administered 2021-06-30: 10 mg via INTRAVENOUS
  Filled 2021-06-30: qty 2

## 2021-06-30 MED ORDER — LOSARTAN POTASSIUM 50 MG PO TABS
100.0000 mg | ORAL_TABLET | Freq: Every day | ORAL | Status: DC
  Administered 2021-06-30 – 2021-07-03 (×4): 100 mg via ORAL
  Filled 2021-06-30 (×4): qty 2

## 2021-06-30 MED ORDER — THIAMINE HCL 100 MG/ML IJ SOLN
100.0000 mg | Freq: Every day | INTRAMUSCULAR | Status: DC
  Administered 2021-06-30 – 2021-07-01 (×2): 100 mg via INTRAVENOUS
  Filled 2021-06-30 (×2): qty 2

## 2021-06-30 MED ORDER — PANTOPRAZOLE SODIUM 40 MG PO TBEC
40.0000 mg | DELAYED_RELEASE_TABLET | Freq: Every day | ORAL | Status: DC
  Administered 2021-06-30 – 2021-07-03 (×4): 40 mg via ORAL
  Filled 2021-06-30 (×4): qty 1

## 2021-06-30 MED ORDER — PRAZOSIN HCL 2 MG PO CAPS
2.0000 mg | ORAL_CAPSULE | Freq: Every day | ORAL | Status: DC
Start: 2021-06-30 — End: 2021-07-03
  Administered 2021-06-30 – 2021-07-02 (×3): 2 mg via ORAL
  Filled 2021-06-30 (×4): qty 1

## 2021-06-30 MED ORDER — INSULIN ASPART 100 UNIT/ML IJ SOLN
0.0000 [IU] | Freq: Three times a day (TID) | INTRAMUSCULAR | Status: DC
  Administered 2021-06-30: 8 [IU] via SUBCUTANEOUS
  Administered 2021-07-01 (×2): 3 [IU] via SUBCUTANEOUS
  Administered 2021-07-01: 11 [IU] via SUBCUTANEOUS
  Administered 2021-07-02 (×2): 3 [IU] via SUBCUTANEOUS
  Administered 2021-07-02: 11 [IU] via SUBCUTANEOUS
  Administered 2021-07-03: 8 [IU] via SUBCUTANEOUS
  Administered 2021-07-03: 3 [IU] via SUBCUTANEOUS

## 2021-06-30 MED ORDER — HYDROCHLOROTHIAZIDE 25 MG PO TABS
25.0000 mg | ORAL_TABLET | Freq: Every day | ORAL | Status: DC
  Administered 2021-07-01 – 2021-07-03 (×3): 25 mg via ORAL
  Filled 2021-06-30 (×3): qty 1

## 2021-06-30 MED ORDER — SODIUM CHLORIDE 0.9 % IV SOLN
INTRAVENOUS | Status: AC
  Filled 2021-06-30 (×4): qty 1000

## 2021-06-30 MED ORDER — METFORMIN HCL ER 500 MG PO TB24
1000.0000 mg | ORAL_TABLET | Freq: Two times a day (BID) | ORAL | Status: DC
  Administered 2021-07-01 – 2021-07-03 (×5): 1000 mg via ORAL
  Filled 2021-06-30 (×6): qty 2

## 2021-06-30 MED ORDER — LACTATED RINGERS IV BOLUS
1000.0000 mL | Freq: Once | INTRAVENOUS | Status: AC
  Administered 2021-06-30: 1000 mL via INTRAVENOUS

## 2021-06-30 MED ORDER — SODIUM CHLORIDE 0.9 % IV SOLN: INTRAVENOUS | Status: DC

## 2021-06-30 MED ORDER — CALCIUM GLUCONATE-NACL 1-0.675 GM/50ML-% IV SOLN
1.0000 g | Freq: Once | INTRAVENOUS | Status: AC
  Administered 2021-06-30: 1000 mg via INTRAVENOUS
  Filled 2021-06-30: qty 50

## 2021-06-30 MED ORDER — OMEPRAZOLE MAGNESIUM 20 MG PO TBEC: 40.0000 mg | DELAYED_RELEASE_TABLET | Freq: Every day | ORAL | Status: DC

## 2021-06-30 MED ORDER — LORAZEPAM 1 MG PO TABS
1.0000 mg | ORAL_TABLET | ORAL | Status: DC | PRN
  Administered 2021-07-01 – 2021-07-02 (×6): 1 mg via ORAL
  Filled 2021-06-30 (×3): qty 1
  Filled 2021-06-30: qty 2
  Filled 2021-06-30 (×2): qty 1

## 2021-06-30 NOTE — ED Provider Notes (Signed)
[facility] EMERGENCY DEPARTMENT Provider Note   CSN: [id number] Arrival date & time: 06/30/21  1315     History  Chief Complaint  Patient presents with   Drug Overdose   Alcohol Problem    [name] [name] is a 61 y.o. male.   Drug Overdose  Alcohol Problem  Patient brought in for potential overdose.  Per EMS may have overdosed on his buprenorphine last night.  He is on 8 mg tablets 3 times a day.  On the 31st of last month had 90 pills filled.  Has 2 bottles with him with only 4 pills left.  However patient states that there were 3 bottles each with 30 tablets in it.  With 30 pills in a bottle we are only potentially a few pills short.  Doubt severe overdose at this time.  However patient states that he does not know what happened.  He is a heavy alcohol drinker and drinks at least fifth a day.  States for last 3 days he has not been doing much she is because she has been so drunk.  Not been taking all his medicines.  For the last 6 to 7 months been drinking heavily.  States has to drink in the morning so he does not withdrawal.  States his mouth is very dry.  Some nausea and vomiting.  No fevers.  Has a history of bipolar disorder.  States he has normally been compliant with medicines but has not been taking this much over the last few days due to drinking so much.  Denies suicide attempt.  States if it was an overdose it was accidental.     Home Medications Prior to Admission medications   Medication Sig Start Date End Date Taking? Authorizing Provider  atorvastatin (LIPITOR) 80 MG tablet Take 1 tablet (80 mg total) by mouth daily. 01/26/21 07/24/21  Camillia Herter, NP  buprenorphine (SUBUTEX) 8 MG SUBL SL tablet Place 8 mg under the tongue every 8 (eight) hours.    [provider]  Dulaglutide (TRULICITY) 2.68 TM/1.9QQ SOPN Inject 0.75 mg into the skin once a week. Patient taking differently: Inject 0.75 mg into the skin every Monday. 01/25/21    Camillia Herter, NP  fenofibrate (TRICOR) 145 MG tablet Take 1 tablet (145 mg total) by mouth daily. 06/19/20   Nicolette Bang, MD  furosemide (LASIX) 20 MG tablet Take 1 tablet (20 mg total) by mouth daily for 7 days. Patient taking differently: Take 20 mg by mouth daily as needed for fluid or edema. 01/25/21 06/21/21  Camillia Herter, NP  hydrochlorothiazide (HYDRODIURIL) 25 MG tablet TAKE 1 TABLET(25 MG) BY MOUTH DAILY Patient taking differently: Take 25 mg by mouth daily. 01/25/21   Camillia Herter, NP  Lancets (FREESTYLE) lancets Use as instructed 05/24/14   [provider]  losartan (COZAAR) 100 MG tablet TAKE 1 TABLET(100 MG) BY MOUTH DAILY Patient taking differently: Take 100 mg by mouth daily. 01/25/21   Camillia Herter, NP  metFORMIN (GLUCOPHAGE-XR) 500 MG 24 hr tablet Take 2 tablets (1,000 mg total) by mouth 2 (two) times daily. Patient taking differently: Take 500 mg by mouth in the morning, at noon, in the evening, and at bedtime. 01/25/21 07/24/21  Camillia Herter, NP  omeprazole (PRILOSEC OTC) 20 MG tablet Take 40 mg by mouth daily.    [provider]  prazosin (MINIPRESS) 2 MG capsule Take 2 mg by mouth at bedtime.    [provider]  QUEtiapine (SEROQUEL) 300 MG tablet Take 150 mg by mouth at bedtime.    [provider]  sertraline (ZOLOFT) 100 MG tablet Take 100 mg by mouth daily. 04/09/20   [provider]  Syringe/Needle, Disp, 30G X 1/2" 1 ML MISC UAD - use as directed 12/29/18   Ladell Pier, MD      Allergies    Heparin, Oxytetracycline, Del-mycin [erythromycin], and Sulfa antibiotics    Review of Systems   Review of Systems  Physical Exam Updated Vital Signs BP (!) 187/78   Pulse 77   Temp 98.2 F (36.8 C) (Oral)   Resp 12   Ht '6\' 2"'$  (1.88 m)   Wt 113.4 kg   SpO2 95%   BMI 32.10 kg/m  Physical Exam Vitals and nursing note reviewed.  HENT:     Head: Atraumatic.     Mouth/Throat:     Mouth: Mucous  membranes are dry.  Pulmonary:     Breath sounds: No wheezing or rhonchi.  Abdominal:     Tenderness: There is no abdominal tenderness.  Musculoskeletal:        General: No tenderness.  Skin:    General: Skin is warm.  Neurological:     Mental Status: He is alert and oriented to person, place, and time.     Comments: Mild tremulousness of hands.     ED Results / Procedures / Treatments   Labs (all labs ordered are listed, but only abnormal results are displayed) Labs Reviewed  COMPREHENSIVE METABOLIC PANEL - Abnormal; Notable for the following components:      Result Value   CO2 11 (*)    Glucose, Bld 229 (*)    Calcium 8.3 (*)    AST 153 (*)    ALT 73 (*)    Anion gap 26 (*)    All other components within normal limits  ETHANOL - Abnormal; Notable for the following components:   Alcohol, Ethyl (B) 61 (*)    All other components within normal limits  CBC WITH DIFFERENTIAL/PLATELET - Abnormal; Notable for the following components:   Platelets 106 (*)    Lymphs Abs 0.5 (*)    All other components within normal limits  SARS CORONAVIRUS 2 BY RT PCR  MAGNESIUM  RAPID URINE DRUG SCREEN, HOSP PERFORMED    EKG EKG Interpretation  Date/Time:  Sunday June 30 2021 13:19:54 EDT Ventricular Rate:  83 PR Interval:  165 QRS Duration: 95 QT Interval:  412 QTC Calculation: 485 R Axis:   -6 Text Interpretation: Sinus rhythm Borderline prolonged QT interval Confirmed by Davonna Belling [phone]) on 06/30/2021 1:48:34 PM  Radiology No results found.  Procedures Procedures    Medications Ordered in ED Medications  thiamine (B-1) injection 100 mg (has no administration in time range)  lactated ringers bolus 1,000 mL (1,000 mLs Intravenous New Bag/Given 06/30/21 1359)    ED Course/ Medical Decision Making/ A&P                           Medical Decision Making Amount and/or Complexity of Data Reviewed Labs: ordered.   Patient with Alcohol abuse and potential overdose.   Patient states he is not sure what happened but he is missing buprenorphine tablets.  Reviewing drug database and the patient's bottles he states he has another bottle of 30 at home that is not with him.  He would not have too many medicines that would be missing.  Doubt severe opiate overdose at this time.  Pupils reassuring and mental status normal.  However also was a heavy drinker.  Has been drinking heavily and has not been drinking today.  Lab work reviewed and does show potentially acidosis.  Mildly tremulous at this time.  Will hydrate and give thiamine.  Will start on CIWA protocol.  Care will be turned over to Dr. Darl Householder.       Final Clinical Impression(s) / ED Diagnoses Final diagnoses:  Alcohol abuse    Rx / DC Orders ED Discharge Orders     None         Davonna Belling, MD 06/30/21 1505

## 2021-06-30 NOTE — ED Notes (Signed)
[place] alarm implemented for fall risk. Pt educated about bed alarm and not getting up without assistance. Pt verbalized understanding.

## 2021-06-30 NOTE — ED Notes (Signed)
Sister Horald Pollen Coralyn Mark [phone] would like an update asap

## 2021-06-30 NOTE — ED Triage Notes (Addendum)
Pt arrived to ED via EMS from home w/ c/o accidental overdose of buprenorphine. Pt also drinks a 5th of liquor a day. Pt reports taking an unknown amount of his medication last night around midnight. Pt receives a 90 day supply, only 2 bottles were found by EMS. There were only 4 tablets left in the 2 bottles that were found. Fill date appears to be 06-12-21. Dosage is '8mg'$  and he is supposed to take them 3 times daily. Pt reports not alcohol use so far today and that he hasn't eaten anything in several days. EMS started 20g L FA and gave '4mg'$  zofran and 1073m bolus of LR. Denies SI/HI.

## 2021-06-30 NOTE — ED Notes (Signed)
Pt resting comfortably in the bed; rise and fall of chest noted; no tremors noted

## 2021-06-30 NOTE — ED Notes (Signed)
Pt spitting in emesis bag at this time.

## 2021-06-30 NOTE — ED Notes (Signed)
Pt given water per Dr. Montine Circle

## 2021-06-30 NOTE — H&P (Signed)
History and Physical    [name] [name] XLK:[id number] DOB: Apr 29, 1960 DOA: 06/30/2021  PCP: Camillia Herter, NP (Confirm with patient/family/NH records and if not entered, this has to be entered at [facility] point of entry) Patient coming from: Home  I have personally briefly reviewed patient's old medical records in [place]  Chief Complaint: Help me  HPI: [name] [name] is a 61 y.o. male with medical history significant of bipolar disorder, alcohol abuse, history of narcotic abuse on Suboxone, HTN, IIDM, came with nauseous vomiting, loss of appetite, and alcohol withdrawal.  Patient reported that he lost both of his parents and started to drink heavily every day for the last 6 months at least half a pint of vodka every day.  3 days ago, he started to feel nauseous and decided to quit drinking water together.  Last night, patient started to have palpitations sweating and tremors of bilateral fingertips.  He became very nervous and accidentally took 2-3 extra Suboxone.  He denied loss of consciousness, pain.  ED Course: Afebrile, blood pressure elevated.  Lab work showed bicarb 11, bicarb improved to 14 after IV bolus x3.  Glucose 229 K3.9, ABG pH 7.29.  Review of Systems: As per HPI otherwise 14 point review of systems negative.    Past Medical History:  Diagnosis Date   Anxiety    Arthritis    Bipolar 1 disorder ([place])    Blood transfusion without reported diagnosis    Cataract    Chronic pain    Depressed bipolar disorder ([place])    Depression    Diabetes mellitus without complication ([place])    Diabetic neuropathy ([place])    GERD (gastroesophageal reflux disease)    Herpes zoster 12/05/2008   Qualifier: Diagnosis of  By: Ronnald Ramp MD, Arvid Right.    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  Coupled with high doses of loperamide used for pain control.  Unintentional overdose -intractable VT  leading to cardiogenic shock - ECMO   Hyperlipidemia    Hypertension    Neuromuscular disorder ([place])    nerve damage back, neck, and shoulder   Neuropathy in diabetes ([place])    Sleep apnea    has CPAP but cannot use it   Substance abuse ([place])    in past     Past Surgical History:  Procedure Laterality Date   COLONOSCOPY     EXTRACORPOREAL CIRCULATION  11/2015   FOR Cardiogenic shock related to intractable [place] VT storm (prolonged QT from drug toxixcity)    INTRAOPERATIVE TRANSESOPHAGEAL ECHOCARDIOGRAM N/A 11/26/2016   Procedure: INTRAOPERATIVE TRANSESOPHAGEAL ECHOCARDIOGRAM;  Surgeon: Ivin Poot, MD;  Location: [place];  Service: Open Heart Surgery;  Laterality: N/A;   IR ANGIOGRAM EXTREMITY LEFT  06/05/2020   IR PTA NON CORO-LOWER EXTREM  06/05/2020   IR RADIOLOGIST EVAL & MGMT  05/21/2020   IR US GUIDE VASC ACCESS LEFT  06/05/2020   POLYPECTOMY     SHOULDER ARTHROSCOPY WITH ROTATOR CUFF REPAIR Right 2009   SPINE SURGERY  2010   TRANSTHORACIC ECHOCARDIOGRAM  07/2016; 12/10/2016   a. Prior to VT arrest: normal. EF 55-60%. Gr 1 DD.  mild LVH. Mildly dilated Aortic Root.;; b.  Normal LV size and function.  Mild LVH.  EF 55%.  No RWMA.  No valve abnormalities.     reports that he has never smoked. He has never used smokeless tobacco. He reports that  he does not currently use alcohol. He reports that he does not use drugs.  Allergies  Allergen Reactions   Heparin Other (See Comments)    HIT Ab negative on 02/20/15, but SRA POSITIVE    Oxytetracycline Rash and Other (See Comments)   Del-Mycin [Erythromycin] Other (See Comments)    All mycin drugs - unknown reaction   Sulfa Antibiotics Other (See Comments)    Unknown reaction    Family History  Problem Relation Age of Onset   Diabetes Mother    Hyperlipidemia Mother    Heart disease Father    Colon cancer Neg Hx    Colon polyps Neg Hx    Esophageal cancer Neg Hx    Rectal cancer Neg Hx    Stomach cancer Neg Hx       Prior to Admission medications   Medication Sig Start Date End Date Taking? Authorizing Provider  atorvastatin (LIPITOR) 80 MG tablet Take 1 tablet (80 mg total) by mouth daily. 01/26/21 07/24/21  Camillia Herter, NP  buprenorphine (SUBUTEX) 8 MG SUBL SL tablet Place 8 mg under the tongue every 8 (eight) hours.    [provider]  Dulaglutide (TRULICITY) 3.82 NK/5.3ZJ SOPN Inject 0.75 mg into the skin once a week. Patient taking differently: Inject 0.75 mg into the skin every Monday. 01/25/21   Camillia Herter, NP  fenofibrate (TRICOR) 145 MG tablet Take 1 tablet (145 mg total) by mouth daily. 06/19/20   Nicolette Bang, MD  furosemide (LASIX) 20 MG tablet Take 1 tablet (20 mg total) by mouth daily for 7 days. Patient taking differently: Take 20 mg by mouth daily as needed for fluid or edema. 01/25/21 06/21/21  Camillia Herter, NP  hydrochlorothiazide (HYDRODIURIL) 25 MG tablet TAKE 1 TABLET(25 MG) BY MOUTH DAILY Patient taking differently: Take 25 mg by mouth daily. 01/25/21   Camillia Herter, NP  Lancets (FREESTYLE) lancets Use as instructed 05/24/14   [provider]  losartan (COZAAR) 100 MG tablet TAKE 1 TABLET(100 MG) BY MOUTH DAILY Patient taking differently: Take 100 mg by mouth daily. 01/25/21   Camillia Herter, NP  metFORMIN (GLUCOPHAGE-XR) 500 MG 24 hr tablet Take 2 tablets (1,000 mg total) by mouth 2 (two) times daily. Patient taking differently: Take 500 mg by mouth in the morning, at noon, in the evening, and at bedtime. 01/25/21 07/24/21  Camillia Herter, NP  omeprazole (PRILOSEC OTC) 20 MG tablet Take 40 mg by mouth daily.    [provider]  prazosin (MINIPRESS) 2 MG capsule Take 2 mg by mouth at bedtime.    [provider]  QUEtiapine (SEROQUEL) 300 MG tablet Take 150 mg by mouth at bedtime.    [provider]  sertraline (ZOLOFT) 100 MG tablet Take 100 mg by mouth daily. 04/09/20   [provider]  Syringe/Needle, Disp,  30G X 1/2" 1 ML MISC UAD - use as directed 12/29/18   Ladell Pier, MD    Physical Exam: Vitals:   06/30/21 1715 06/30/21 1730 06/30/21 1745 06/30/21 1800  BP: (!) 179/80 (!) 189/86 (!) 181/82 (!) 176/83  Pulse: 69 76 71 68  Resp: '18 15 17 19  '$ Temp:      TempSrc:      SpO2: 95% 98% 97% 94%  Weight:      Height:        Constitutional: NAD, calm, comfortable Vitals:   06/30/21 1715 06/30/21 1730 06/30/21 1745 06/30/21 1800  BP: (!) 179/80 Marland Kitchen)  189/86 (!) 181/82 (!) 176/83  Pulse: 69 76 71 68  Resp: '18 15 17 19  '$ Temp:      TempSrc:      SpO2: 95% 98% 97% 94%  Weight:      Height:       Eyes: PERRL, lids and conjunctivae normal ENMT: Mucous membranes are dry. Posterior pharynx clear of any exudate or lesions.Normal dentition.  Neck: normal, supple, no masses, no thyromegaly Respiratory: clear to auscultation bilaterally, no wheezing, no crackles. Normal respiratory effort. No accessory muscle use.  Cardiovascular: Regular rate and rhythm, no murmurs / rubs / gallops. No extremity edema. 2+ pedal pulses. No carotid bruits.  Abdomen: no tenderness, no masses palpated. No hepatosplenomegaly. Bowel sounds positive.  Musculoskeletal: no clubbing / cyanosis. No joint deformity upper and lower extremities. Good ROM, no contractures. Normal muscle tone.  Fine tremors on bilateral forearms and hands Skin: no rashes, lesions, ulcers. No induration Neurologic: CN 2-12 grossly intact. Sensation intact, DTR normal. Strength 5/5 in all 4.  Psychiatric: Normal judgment and insight. Alert and oriented x 3. Normal mood.     Labs on Admission: I have personally reviewed following labs and imaging studies  CBC: Recent Labs  Lab 06/30/21 1338 06/30/21 1557  WBC 4.5  --   NEUTROABS 3.8  --   HGB 13.7 13.9  HCT 41.7 41.0  MCV 85.6  --   PLT 106*  --    Basic Metabolic Panel: Recent Labs  Lab 06/30/21 1338 06/30/21 1510 06/30/21 1557  NA 140 139 138  K 3.9 4.1 4.2  CL 103 103   --   CO2 11* 14*  --   GLUCOSE 229* 214*  --   BUN 17 15  --   CREATININE 1.13 0.98  --   CALCIUM 8.3* 8.1*  --   MG 2.1  --   --    GFR: Estimated Creatinine Clearance: 108.7 mL/min (by C-G formula based on SCr of 0.98 mg/dL). Liver Function Tests: Recent Labs  Lab 06/30/21 1338  AST 153*  ALT 73*  ALKPHOS 57  BILITOT 0.9  PROT 6.9  ALBUMIN 3.9   No results for input(s): "LIPASE", "AMYLASE" in the last 168 hours. No results for input(s): "AMMONIA" in the last 168 hours. Coagulation Profile: No results for input(s): "INR", "PROTIME" in the last 168 hours. Cardiac Enzymes: No results for input(s): "CKTOTAL", "CKMB", "CKMBINDEX", "TROPONINI" in the last 168 hours. BNP (last 3 results) No results for input(s): "PROBNP" in the last 8760 hours. HbA1C: No results for input(s): "HGBA1C" in the last 72 hours. CBG: No results for input(s): "GLUCAP" in the last 168 hours. Lipid Profile: No results for input(s): "CHOL", "HDL", "LDLCALC", "TRIG", "CHOLHDL", "LDLDIRECT" in the last 72 hours. Thyroid Function Tests: No results for input(s): "TSH", "T4TOTAL", "FREET4", "T3FREE", "THYROIDAB" in the last 72 hours. Anemia Panel: No results for input(s): "VITAMINB12", "FOLATE", "FERRITIN", "TIBC", "IRON", "RETICCTPCT" in the last 72 hours. Urine analysis:    Component Value Date/Time   COLORURINE YELLOW 04/27/2020 2050   APPEARANCEUR CLEAR 04/27/2020 2050   LABSPEC 1.019 04/27/2020 2050   PHURINE 5.0 04/27/2020 2050   GLUCOSEU 50 (A) 04/27/2020 2050   HGBUR NEGATIVE 04/27/2020 2050   [place] NEGATIVE 04/27/2020 2050   KETONESUR NEGATIVE 04/27/2020 2050   PROTEINUR NEGATIVE 04/27/2020 2050   UROBILINOGEN 0.2 05/18/2008 1401   NITRITE NEGATIVE 04/27/2020 2050   LEUKOCYTESUR NEGATIVE 04/27/2020 2050    Radiological Exams on Admission: No results found.  EKG: Independently reviewed.  Sinus, borderline prolonged QTc  Assessment/Plan Principal Problem:   Alcohol withdrawal  (HCC) Active Problems:   Alcoholic ketoacidosis  (please populate well all problems here in Problem List. (For example, if patient is on BP meds at home and you resume or decide to hold them, it is a problem that needs to be her. Same for CAD, COPD, HLD and so on)  Alcoholic/starvation ketoacidosis -Clinically still has signs of hypovolemia -Plan to use normal saline with multivitamin for hydration and alcohol withdrawal together. -Given the clear etiology and normal pH, decided not to use insulin drip at this time.  Alcohol withdrawal -Symptoms are severe, admit to telemetry, close monitoring, enforced with CIWA protocol -Consult case management for rehab program  IIDM with hyperglycemia -Sliding scale -Continue metformin  Acute transaminitis -Secondary alcohol abuse -Hold off statin -Check RUQ ultrasound -Trend LFTs  HTN, uncontrolled -Resume HCTZ and lisinopril  History of narcotic dependence on Suboxone therapy -No signs of overdose, pupil reactive, mentation at baseline.  Resume Suboxone therapy  Bipolar disorder -Denies any suicidal ideation, continue SSRI.  DVT prophylaxis: SCD Code Status: Full code Family Communication: None at bedside Disposition Plan: Patient is sick with severe alcohol withdrawal and ketoacidosis, expect more than 2 midnight hospital stay. Consults called: None Admission status: Telemetry admission   Lequita Halt MD [facility] Pager [phone] 06/30/2021, 6:05 PM

## 2021-06-30 NOTE — ED Provider Notes (Signed)
  Physical Exam  BP (!) 176/83   Pulse 68   Temp 98.2 F (36.8 C) (Oral)   Resp 19   Ht '6\' 2"'$  (1.88 m)   Wt 113.4 kg   SpO2 94%   BMI 32.10 kg/m   Physical Exam  Procedures  Procedures  ED Course / MDM    Medical Decision Making Care assumed at 3 PM.  Patient started drinking alcohol several months ago.  Patient decided to quit yesterday and started vomiting.  Patient's initial CIWA is 6.  He has bicarb of 11.  Signout pending repeat BMP and reassessment  5:30 pm Patient is tremulous and shaky.  Patient is also hypertensive.  Repeat blood work showed bicarb of 14 and anion gap of 22.  VBG shows mild acidosis of 7.29.  At this point, patient will be admitted for alcoholic ketoacidosis  Problems Addressed: Alcohol abuse: acute illness or injury Alcoholic ketoacidosis: acute illness or injury  Amount and/or Complexity of Data Reviewed Labs: ordered. Decision-making details documented in ED Course.  Risk Prescription drug management. Decision regarding hospitalization.          Drenda Freeze, MD 06/30/21 [phone]

## 2021-07-01 DIAGNOSIS — F10930 Alcohol use, unspecified with withdrawal, uncomplicated: Secondary | ICD-10-CM | POA: Diagnosis not present

## 2021-07-01 LAB — COMPREHENSIVE METABOLIC PANEL
ALT: 59 U/L — ABNORMAL HIGH (ref 0–44)
AST: 94 U/L — ABNORMAL HIGH (ref 15–41)
Albumin: 3.3 g/dL — ABNORMAL LOW (ref 3.5–5.0)
Alkaline Phosphatase: 44 U/L (ref 38–126)
Anion gap: 7 (ref 5–15)
BUN: 17 mg/dL (ref 6–20)
CO2: 26 mmol/L (ref 22–32)
Calcium: 8.3 mg/dL — ABNORMAL LOW (ref 8.9–10.3)
Chloride: 103 mmol/L (ref 98–111)
Creatinine, Ser: 1.06 mg/dL (ref 0.61–1.24)
GFR, Estimated: >60 mL/min (ref >60)
Glucose, Bld: 254 mg/dL — ABNORMAL HIGH (ref 70–99)
Potassium: 3.9 mmol/L (ref 3.5–5.1)
Sodium: 136 mmol/L (ref 135–145)
Total Bilirubin: 1.5 mg/dL — ABNORMAL HIGH (ref 0.3–1.2)
Total Protein: 5.9 g/dL — ABNORMAL LOW (ref 6.5–8.1)

## 2021-07-01 LAB — GLUCOSE, CAPILLARY
Glucose-Capillary: 334 mg/dL — ABNORMAL HIGH (ref 70–99)
Glucose-Capillary: 185 mg/dL — ABNORMAL HIGH (ref 70–99)
Glucose-Capillary: 174 mg/dL — ABNORMAL HIGH (ref 70–99)
Glucose-Capillary: 261 mg/dL — ABNORMAL HIGH (ref 70–99)

## 2021-07-01 LAB — HIV ANTIBODY (ROUTINE TESTING W REFLEX): HIV Screen 4th Generation wRfx: NONREACTIVE

## 2021-07-01 MED ORDER — CHLORDIAZEPOXIDE HCL 5 MG PO CAPS
25.0000 mg | ORAL_CAPSULE | Freq: Four times a day (QID) | ORAL | Status: AC
  Administered 2021-07-01 – 2021-07-02 (×4): 25 mg via ORAL
  Filled 2021-07-01 (×4): qty 5

## 2021-07-01 MED ORDER — CHLORDIAZEPOXIDE HCL 5 MG PO CAPS
25.0000 mg | ORAL_CAPSULE | Freq: Three times a day (TID) | ORAL | Status: DC
  Administered 2021-07-02 – 2021-07-03 (×2): 25 mg via ORAL
  Filled 2021-07-01 (×2): qty 5

## 2021-07-01 MED ORDER — CHLORDIAZEPOXIDE HCL 5 MG PO CAPS: 25.0000 mg | ORAL_CAPSULE | Freq: Every day | ORAL | Status: DC

## 2021-07-01 MED ORDER — CHLORDIAZEPOXIDE HCL 5 MG PO CAPS: 25.0000 mg | ORAL_CAPSULE | ORAL | Status: DC

## 2021-07-01 NOTE — Progress Notes (Signed)
07/01/21 1330  Clinical Encounter Type  Visited With Patient  Visit Type Initial;Other (Comment) (Advance Directive Education)  Referral From Nurse Samara Deist A. Sickles, RN and [place] Cage. Granville Lewis, RN)  Consult/Referral [name] Chaplain Benetta Spar)  Recommendations Advance Directive Education   Chaplain responded [name] spiritual care consult request for Advance Directive education with [name]. Met with [name] at patient's bedside.  [name] stated that [name] intends [name] list his brother [name] [name], and his son [name] [name] [name] his health care agents.    Chaplain provided [name] Advance Directive packet [name] well [name] education on Advance Directives-documents an individual completes [name] communicate their health care directions in advance of a time [name] they may need them. Chaplain informed [name] [name] documents which may be completed here in [name] hospital are [name] Living Will and Health Care Power of Attorney.   Chaplain informed patient that [name] Health Care Power of [name] [name] is a legal document in which an individual names another [name], [name] [name], [name] [name], [name] [name], [name] [name] Health Care [name]'s function can be temporary or permanent depending on his ability [name] make and communicate those [name] independently.  Chaplain informed [name] in [name] absence of a Health Care Power of [name] [name], [name] state of [place] directs health care providers [name] look [name] [name] following individuals in [name] order listed: legal guardian; an attorney-in-fact under a general power of attorney (POA) if that POA includes [name] right [name] make health care [name]; [name]'s spouse; a majority of his children; a majority of adult brothers and sisters; or an individual who has an established relationship with you, who is acting in good faith and who can convey your wishes.  If none of these persons are  available or willing [name] make medical [name] on a patient's behalf, [name] law allows [name] patient's doctor [name] make [name] for them [name] long [name] another doctor agrees with those [name].  Chaplain also informed [name] patient that [name] Health Care [name] has no decision-making authority over any affairs other than those related [name] his medical care.   [name] chaplain further educated [name] that a Living Will is a legal document that allows his desires not [name] receive life-prolonging measures in [name] event that they have a condition that is incurable and will result in his death in a short period of time; they are unconscious, and doctors are confident that they will not regain consciousness; and/or they have advanced dementia or other substantial and irreversible loss of mental function.   [name] chaplain informed [name] that life-prolonging measures are medical treatments that would only serve [name] postpone death, including breathing machines, kidney dialysis, antibiotics, artificial nutrition and hydration (tube feeding), and similar forms of treatment and that if an individual is able [name] express their wishes, they may also make them known without [name] use of a Living Will, but in [name] event that [name] is not able [name] express his wishes, a Living Will allows medical providers and his family and [name] [name] ensure that they are not making [name] on his behalf, but rather serving [name] his voice [name] convey [name] [name] that [name] has already made.   [name] is aware that [name] decision [name] create an advance directive is his alone and [name] may choose not [name] complete [name] documents or may choose [name] complete one portion or both.  [name] was informed that  [name] can revoke [name] documents at any time by striking through them and writing void or by completing new documents, but that it is also advisable that [name] individual verbally notify interested parties that his wishes have changed.   [name] is also aware that  [name] document must be signed in [name] presence of a notary public and two witnesses and that this can be done while [name] patient is still admitted [name] [name] hospital or after discharge in [name] community. If they decide [name] complete Advance Directives after being discharged from [name] hospital, they have been advised [name] notify all interested parties and [name] provide those documents [name] their physicians and loved ones in addition [name] bringing them [name] [name] hospital in [name] event of another hospitalization.   [name] chaplain informed [name] [name] that if [name] desires [name] proceed with completing Advance Directive Documentation while [name] is still admitted, notary services are sometimes available at [facility] between [name] hours of 1:00 and 3:30 Monday-Thursday. [name] did not have any additional questions and stated that his brother would be visiting and that they would complete [name] documents at that time.   [name] [name] patient is ready [name] have these documents completed, [name] patient should request that their nurse place a spiritual care consult and indicate that [name] patient is ready [name] have their advance directives notarized so that arrangements for witnesses and notary public can be made.   Please page spiritual care if [name] patient desires further education or has questions.   [address] [place], [place]. [place]., [phone].

## 2021-07-01 NOTE — Progress Notes (Signed)
PROGRESS NOTE    [name] [name]  BHA:[id number] DOB: 09-10-1960 DOA: 06/30/2021 PCP: Camillia Herter, NP   Brief Narrative:  [name] [name] is a 61 y.o. male with medical history significant of bipolar disorder, alcohol abuse, history of narcotic abuse on Suboxone, HTN, IIDM, came with nauseous vomiting, loss of appetite, and alcohol withdrawal. Reports to me a liter of vodka daily on average - sometimes more. Admits to accidentally taking extra suboxone as well.  Assessment & Plan:   Principal Problem:   Alcohol withdrawal ([place]) Active Problems:   Alcoholic ketoacidosis  Acute withdrawals in the setting of alcohol abuse  Concurrent alcoholic ketoacidosis  -Profound alcohol use and abuse history as above -Add Librium scheduled, continue CIWA protocol with as needed Ativan as needed -Remains tremulous today despite Ativan although minimally improving since yesterday. -Continue IV fluids and vitamin supplementation as appropriate until p.o. intake improves   IIDM type II uncontrolled with hyperglycemia Continue metformin, sliding scale insulin, hypoglycemic protocol   Acute liver dysfunction and elevated enzymes -Secondary to acute on chronic alcohol abuse as above -Follow repeat labs, currently stable asymptomatic -Ultrasound showing hepatic steatosis and hepatomegaly    HTN, uncontrolled -Continue lisinopril/HCTZ  Narcotic dependence on Suboxone -Concern for polypharmacy -Patient admitted and intake " taking extra dose on accident" but this was done without overt intention for self-harm or overdose   Bipolar disorder -Continue SSRI   DVT prophylaxis: SCD Code Status: Full code Family Communication: None at bedside  Status is: Inpatient  Dispo:Inpatiente patient is from: Home         Home  Anticipated d/c is to: Home              Anticipated d/c date is: 48 to 72 hours              Patient currently not medically stable for discharge  Consultants:   None  Procedures:  None  Antimicrobials:  None indicated  Subjective: No acute issues or events overnight denies nausea vomiting diarrhea constipation headache fevers chills or chest pain  Objective: Vitals:   06/30/21 1915 06/30/21 2037 07/01/21 0015 07/01/21 0359  BP: (!) 183/90 (!) 183/86 131/66 (!) 165/89  Pulse: 73 68 81 87  Resp: '11 20 20 20  '$ Temp:  98.3 F (36.8 C) 98.3 F (36.8 C) 97.8 F (36.6 C)  TempSrc:  Oral Oral Oral  SpO2: 98% 99% 97% 97%  Weight:  106.9 kg    Height:  '6\' 1"'$  (1.854 m)      Intake/Output Summary (Last 24 hours) at 07/01/2021 0819 Last data filed at 07/01/2021 9735 Gross per 24 hour  Intake 5521.4 ml  Output 1150 ml  Net 4371.4 ml   Filed Weights   06/30/21 1328 06/30/21 2037  Weight: 113.4 kg 106.9 kg    Examination:  General:  Pleasantly resting in bed, No acute distress.   Neck:  Without mass or deformity.  Trachea is midline. Lungs:  Clear to auscultate bilaterally without rhonchi, wheeze, or rales. Heart:  Regular rate and rhythm.  Without murmurs, rubs, or gallops. Abdomen:  Soft, nontender, nondistended.  Without guarding or rebound. Extremities: Intention tremor noted while speaking today without overt resting tremor; mild clubbing of the fingers noted   Data Reviewed: I have personally reviewed following labs and imaging studies  CBC: Recent Labs  Lab 06/30/21 1338 06/30/21 1557  WBC 4.5  --   NEUTROABS 3.8  --   HGB 13.7 13.9  HCT 41.7 41.0  MCV 85.6  --   PLT 106*  --    Basic Metabolic Panel: Recent Labs  Lab 06/30/21 1338 06/30/21 1510 06/30/21 1557 07/01/21 0330  NA 140 139 138 136  K 3.9 4.1 4.2 3.9  CL 103 103  --  103  CO2 11* 14*  --  26  GLUCOSE 229* 214*  --  254*  BUN 17 15  --  17  CREATININE 1.13 0.98  --  1.06  CALCIUM 8.3* 8.1*  --  8.3*  MG 2.1  --   --   --    GFR: Estimated Creatinine Clearance: 96.3 mL/min (by C-G formula based on SCr of 1.06 mg/dL). Liver Function  Tests: Recent Labs  Lab 06/30/21 1338 07/01/21 0330  AST 153* 94*  ALT 73* 59*  ALKPHOS 57 44  BILITOT 0.9 1.5*  PROT 6.9 5.9*  ALBUMIN 3.9 3.3*   No results for input(s): "LIPASE", "AMYLASE" in the last 168 hours. No results for input(s): "AMMONIA" in the last 168 hours. Coagulation Profile: No results for input(s): "INR", "PROTIME" in the last 168 hours. Cardiac Enzymes: No results for input(s): "CKTOTAL", "CKMB", "CKMBINDEX", "TROPONINI" in the last 168 hours. BNP (last 3 results) No results for input(s): "PROBNP" in the last 8760 hours. HbA1C: No results for input(s): "HGBA1C" in the last 72 hours. CBG: Recent Labs  Lab 06/30/21 2047 07/01/21 0743  GLUCAP 255* 334*   Lipid Profile: No results for input(s): "CHOL", "HDL", "LDLCALC", "TRIG", "CHOLHDL", "LDLDIRECT" in the last 72 hours. Thyroid Function Tests: No results for input(s): "TSH", "T4TOTAL", "FREET4", "T3FREE", "THYROIDAB" in the last 72 hours. Anemia Panel: No results for input(s): "VITAMINB12", "FOLATE", "FERRITIN", "TIBC", "IRON", "RETICCTPCT" in the last 72 hours. Sepsis Labs: No results for input(s): "PROCALCITON", "LATICACIDVEN" in the last 168 hours.  Recent Results (from the past 240 hour(s))  SARS Coronavirus 2 by RT PCR (hospital order, performed in [facility] hospital lab) *cepheid single result test* Urine, Clean Catch     Status: None   Collection Time: 06/30/21  1:38 PM   Specimen: Urine, Clean Catch; Nasal Swab  Result Value Ref Range Status   SARS Coronavirus 2 by RT PCR NEGATIVE NEGATIVE Final    Comment: (NOTE) SARS-CoV-2 target nucleic acids are NOT DETECTED.  The SARS-CoV-2 RNA is generally detectable in upper and lower respiratory specimens during the acute phase of infection. The lowest concentration of SARS-CoV-2 viral copies this assay can detect is 250 copies / mL. A negative result does not preclude SARS-CoV-2 infection and should not be used as the sole basis for treatment or  other patient management decisions.  A negative result may occur with improper specimen collection / handling, submission of specimen other than nasopharyngeal swab, presence of viral mutation(s) within the areas targeted by this assay, and inadequate number of viral copies (<250 copies / mL). A negative result must be combined with clinical observations, patient history, and epidemiological information.  Fact Sheet for Patients:   https://www.patel.info/  Fact Sheet for Healthcare Providers: https://hall.com/  This test is not yet approved or  cleared by the [place] FDA and has been authorized for detection and/or diagnosis of SARS-CoV-2 by FDA under an Emergency Use Authorization (EUA).  This EUA will remain in effect (meaning this test can be used) for the duration of the COVID-19 declaration under Section 564(b)(1) of the Act, 21 U.S.C. section 360bbb-3(b)(1), unless the authorization is terminated or revoked sooner.  Performed at [facility], [place] [address],  [place], [place] [address]          Radiology Studies: US Abdomen Limited RUQ (LIVER/GB)  Result Date: 06/30/2021 CLINICAL DATA:  Elevated liver function tests EXAM: ULTRASOUND ABDOMEN LIMITED RIGHT UPPER QUADRANT COMPARISON:  CT 03/20/2016 FINDINGS: Gallbladder: No gallstones or wall thickening visualized. No sonographic Murphy sign noted by sonographer. Common bile duct: Diameter: 6 mm in proximal diameter Liver: Liver size is enlarged. Hepatic parenchymal echogenicity is diffusely increased and there is poor acoustic through transmission in keeping with moderate hepatic steatosis. No focal intrahepatic masses are seen and there is no intrahepatic biliary ductal dilation. Portal vein is patent on color Doppler imaging with normal direction of blood flow towards the liver. Other: No ascites IMPRESSION: Hepatomegaly and hepatic steatosis. Electronically Signed   By:  Fidela Salisbury M.D.   On: 06/30/2021 19:00    Scheduled Meds:  buprenorphine  8 mg Sublingual Q8H   hydrochlorothiazide  25 mg Oral Daily   insulin aspart  0-15 Units Subcutaneous TID WC   losartan  100 mg Oral Daily   metFORMIN  1,000 mg Oral BID   pantoprazole  40 mg Oral Daily   prazosin  2 mg Oral QHS   QUEtiapine  150 mg Oral QHS   sertraline  100 mg Oral Daily   thiamine injection  100 mg Intravenous Daily   Continuous Infusions:  sodium chloride 0.9 % 1,000 mL with M.V.I. Adult (INFUVITE ADULT) 10 mL infusion 125 mL/hr at 07/01/21 2229    LOS: 1 day   Time spent: 22mn  Kay Ricciuti C Tosh Glaze, DO [facility]  If 7PM-7AM, please contact night-coverage [email or web address]  07/01/2021, 8:19 AM

## 2021-07-01 NOTE — Plan of Care (Signed)

## 2021-07-01 NOTE — Progress Notes (Signed)
CSW received consult for substance use. CSW met with patient at bedside. CSW offered patient outpatient substance use treatment services resources. Patient accepted. All questions answered. No further questions reported at this time.

## 2021-07-02 ENCOUNTER — Other Ambulatory Visit: Payer: Self-pay

## 2021-07-02 DIAGNOSIS — E8729 Other acidosis: Secondary | ICD-10-CM

## 2021-07-02 DIAGNOSIS — F10930 Alcohol use, unspecified with withdrawal, uncomplicated: Secondary | ICD-10-CM | POA: Diagnosis not present

## 2021-07-02 LAB — GLUCOSE, CAPILLARY
Glucose-Capillary: 324 mg/dL — ABNORMAL HIGH (ref 70–99)
Glucose-Capillary: 180 mg/dL — ABNORMAL HIGH (ref 70–99)
Glucose-Capillary: 193 mg/dL — ABNORMAL HIGH (ref 70–99)
Glucose-Capillary: 227 mg/dL — ABNORMAL HIGH (ref 70–99)

## 2021-07-02 LAB — CBC
HCT: 38.1 % — ABNORMAL LOW (ref 39.0–52.0)
Hemoglobin: 13.3 g/dL (ref 13.0–17.0)
MCH: 28.7 pg (ref 26.0–34.0)
MCHC: 34.9 g/dL (ref 30.0–36.0)
MCV: 82.1 fL (ref 80.0–100.0)
Platelets: 66 10*3/uL — ABNORMAL LOW (ref 150–400)
RBC: 4.64 MIL/uL (ref 4.22–5.81)
RDW: 13.8 % (ref 11.5–15.5)
WBC: 2.7 10*3/uL — ABNORMAL LOW (ref 4.0–10.5)
nRBC: 0 % (ref 0.0–0.2)

## 2021-07-02 LAB — COMPREHENSIVE METABOLIC PANEL
ALT: 58 U/L — ABNORMAL HIGH (ref 0–44)
AST: 89 U/L — ABNORMAL HIGH (ref 15–41)
Albumin: 3.3 g/dL — ABNORMAL LOW (ref 3.5–5.0)
Alkaline Phosphatase: 45 U/L (ref 38–126)
Anion gap: 10 (ref 5–15)
BUN: 14 mg/dL (ref 6–20)
CO2: 25 mmol/L (ref 22–32)
Calcium: 8.9 mg/dL (ref 8.9–10.3)
Chloride: 102 mmol/L (ref 98–111)
Creatinine, Ser: 1.04 mg/dL (ref 0.61–1.24)
GFR, Estimated: >60 mL/min (ref >60)
Glucose, Bld: 249 mg/dL — ABNORMAL HIGH (ref 70–99)
Potassium: 3.3 mmol/L — ABNORMAL LOW (ref 3.5–5.1)
Sodium: 137 mmol/L (ref 135–145)
Total Bilirubin: 0.8 mg/dL (ref 0.3–1.2)
Total Protein: 5.9 g/dL — ABNORMAL LOW (ref 6.5–8.1)

## 2021-07-02 MED ORDER — INSULIN GLARGINE-YFGN 100 UNIT/ML ~~LOC~~ SOLN
15.0000 [IU] | Freq: Every day | SUBCUTANEOUS | Status: DC
  Administered 2021-07-02: 15 [IU] via SUBCUTANEOUS
  Filled 2021-07-02 (×2): qty 0.15

## 2021-07-02 MED ORDER — THIAMINE HCL 100 MG PO TABS
100.0000 mg | ORAL_TABLET | Freq: Every day | ORAL | Status: DC
  Administered 2021-07-02 – 2021-07-03 (×2): 100 mg via ORAL
  Filled 2021-07-02 (×2): qty 1

## 2021-07-02 NOTE — Plan of Care (Signed)

## 2021-07-02 NOTE — Inpatient Diabetes Management (Addendum)
Inpatient Diabetes Program Recommendations  AACE/ADA: New Consensus Statement on Inpatient Glycemic Control (2015)  Target Ranges:  Prepandial:   less than 140 mg/dL      Peak postprandial:   less than 180 mg/dL (1-2 hours)      Critically ill patients:  140 - 180 mg/dL   Lab Results  Component Value Date   GLUCAP 324 (H) 07/02/2021   HGBA1C 9.6 (A) 01/25/2021    Review of Glycemic Control  Latest Reference Range & Units 07/01/21 07:43 07/01/21 11:40 07/01/21 16:00 07/01/21 21:56 07/02/21 08:04  Glucose-Capillary 70 - 99 mg/dL 334 (H) 185 (H) 174 (H) 261 (H) 324 (H)  (H): Data is abnormally high  Diabetes history: DM2 Outpatient Diabetes medications:  Trulicity 1.16 mg every Monday Metformin 500 mg QID Current orders for Inpatient glycemic control:  Novolog 0-15 units TID Metfromin 1000 mg BID   Inpatient Diabetes Program Recommendations:    Semglee 16 units QD (0.15 units/kg) Obtain current A1C given elevated CBGs   Last A1C was 9.6% in January  Will continue to follow while inpatient.  Thank you, Reche Dixon, MSN, [place] Diabetes Coordinator Inpatient Diabetes Program [phone] (team pager from 8a-5p)

## 2021-07-02 NOTE — Progress Notes (Signed)
PROGRESS NOTE    [name] [name]  TKP:[id number] DOB: 1960-05-15 DOA: 06/30/2021 PCP: Camillia Herter, NP   Brief Narrative:  [name] [name] is a 61 y.o. male with medical history significant of bipolar disorder, alcohol abuse, history of narcotic abuse on Suboxone, HTN, IIDM, came with nauseous vomiting, loss of appetite, and alcohol withdrawal. Reports to me a liter of vodka daily on average - sometimes more. Admits to accidentally taking extra suboxone as well.  Assessment & Plan:   Principal Problem:   Alcohol withdrawal ([place]) Active Problems:   Alcoholic ketoacidosis  Acute withdrawals in the setting of alcohol abuse  Concurrent alcoholic ketoacidosis  -Profound alcohol use and abuse history as above -Continue Librium scheduled, along with concurrent CIWA protocol with as needed Ativan as needed -Continues to score with tremors and anxiety despite both Librium taper and Ativan as above.  Remains high risk for withdrawals -Continue IV fluids and vitamin supplementation as appropriate until p.o. intake improves   Insulin-dependent diabetes type II uncontrolled with hyperglycemia - Continue metformin, sliding scale insulin, hypoglycemic protocol - Hemoglobin 9.6 -Transition to long-acting insulin, will likely need insulin at discharge given profoundly uncontrolled hyperglycemia -Initiate Semglee 15 units at bedtime - titrate accordingly   Acute liver dysfunction and elevated enzymes -Secondary to acute on chronic alcohol abuse as above -Repeat labs appear stable if not minimally downtrending, likely patient's baseline given chronic alcohol abuse -Ultrasound showing hepatic steatosis and hepatomegaly    HTN, uncontrolled -Continue lisinopril/HCTZ  Narcotic dependence on Suboxone -Concern for polypharmacy -Patient admitted and intake " taking extra dose on accident" but this was done without overt intention for self-harm or overdose   Bipolar disorder -Continue  SSRI   DVT prophylaxis: SCDs Code Status: Full code Family Communication: None at bedside  Status is: Inpatient  Dispo:Inpatiente patient is from: Home         Home  Anticipated d/c is to: Home              Anticipated d/c date is: 24-48 hours              Patient currently not medically stable for discharge  Consultants:  None  Procedures:  None  Antimicrobials:  None indicated  Subjective: No acute issues or events overnight, remains markedly anxious somewhat tremulous today even at rest despite Librium and Ativan as above.  Discussed importance of ongoing p.o. intake and fluid intake in the interim.  Otherwise denies nausea vomiting diarrhea constipation headache fevers chills or chest pain.  Objective: Vitals:   07/01/21 2354 07/02/21 0303 07/02/21 0348 07/02/21 0755  BP: 113/84 128/80 124/78 136/76  Pulse: 98 62 60   Resp: 18   17  Temp: 98 F (36.7 C)  98 F (36.7 C) 97.8 F (36.6 C)  TempSrc: Oral  Oral Oral  SpO2:   99% 99%  Weight:      Height:        Intake/Output Summary (Last 24 hours) at 07/02/2021 0829 Last data filed at 07/02/2021 0005 Gross per 24 hour  Intake 250 ml  Output 1750 ml  Net -1500 ml    Filed Weights   06/30/21 1328 06/30/21 2037  Weight: 113.4 kg 106.9 kg    Examination:  General:  Pleasantly resting in bed, No acute distress.   Neck:  Without mass or deformity.  Trachea is midline. Lungs:  Clear to auscultate bilaterally without rhonchi, wheeze, or rales. Heart:  Regular rate and rhythm.  Without murmurs, rubs, or  gallops. Abdomen:  Soft, nontender, nondistended.  Without guarding or rebound. Extremities: Intention tremor noted while speaking today without overt resting tremor; mild clubbing of the fingers noted   Data Reviewed: I have personally reviewed following labs and imaging studies  CBC: Recent Labs  Lab 06/30/21 1338 06/30/21 1557 07/02/21 0512  WBC 4.5  --  2.7*  NEUTROABS 3.8  --   --   HGB 13.7 13.9  13.3  HCT 41.7 41.0 38.1*  MCV 85.6  --  82.1  PLT 106*  --  66*    Basic Metabolic Panel: Recent Labs  Lab 06/30/21 1338 06/30/21 1510 06/30/21 1557 07/01/21 0330 07/02/21 0512  NA 140 139 138 136 137  K 3.9 4.1 4.2 3.9 3.3*  CL 103 103  --  103 102  CO2 11* 14*  --  26 25  GLUCOSE 229* 214*  --  254* 249*  BUN 17 15  --  17 14  CREATININE 1.13 0.98  --  1.06 1.04  CALCIUM 8.3* 8.1*  --  8.3* 8.9  MG 2.1  --   --   --   --     GFR: Estimated Creatinine Clearance: 98.1 mL/min (by C-G formula based on SCr of 1.04 mg/dL). Liver Function Tests: Recent Labs  Lab 06/30/21 1338 07/01/21 0330 07/02/21 0512  AST 153* 94* 89*  ALT 73* 59* 58*  ALKPHOS 57 44 45  BILITOT 0.9 1.5* 0.8  PROT 6.9 5.9* 5.9*  ALBUMIN 3.9 3.3* 3.3*    No results for input(s): "LIPASE", "AMYLASE" in the last 168 hours. No results for input(s): "AMMONIA" in the last 168 hours. Coagulation Profile: No results for input(s): "INR", "PROTIME" in the last 168 hours. Cardiac Enzymes: No results for input(s): "CKTOTAL", "CKMB", "CKMBINDEX", "TROPONINI" in the last 168 hours. BNP (last 3 results) No results for input(s): "PROBNP" in the last 8760 hours. HbA1C: No results for input(s): "HGBA1C" in the last 72 hours. CBG: Recent Labs  Lab 06/30/21 2047 07/01/21 0743 07/01/21 1140 07/01/21 1600 07/01/21 2156  GLUCAP 255* 334* 185* 174* 261*    Lipid Profile: No results for input(s): "CHOL", "HDL", "LDLCALC", "TRIG", "CHOLHDL", "LDLDIRECT" in the last 72 hours. Thyroid Function Tests: No results for input(s): "TSH", "T4TOTAL", "FREET4", "T3FREE", "THYROIDAB" in the last 72 hours. Anemia Panel: No results for input(s): "VITAMINB12", "FOLATE", "FERRITIN", "TIBC", "IRON", "RETICCTPCT" in the last 72 hours. Sepsis Labs: No results for input(s): "PROCALCITON", "LATICACIDVEN" in the last 168 hours.  Recent Results (from the past 240 hour(s))  SARS Coronavirus 2 by RT PCR (hospital order, performed  in [facility] hospital lab) *cepheid single result test* Urine, Clean Catch     Status: None   Collection Time: 06/30/21  1:38 PM   Specimen: Urine, Clean Catch; Nasal Swab  Result Value Ref Range Status   SARS Coronavirus 2 by RT PCR NEGATIVE NEGATIVE Final    Comment: (NOTE) SARS-CoV-2 target nucleic acids are NOT DETECTED.  The SARS-CoV-2 RNA is generally detectable in upper and lower respiratory specimens during the acute phase of infection. The lowest concentration of SARS-CoV-2 viral copies this assay can detect is 250 copies / mL. A negative result does not preclude SARS-CoV-2 infection and should not be used as the sole basis for treatment or other patient management decisions.  A negative result may occur with improper specimen collection / handling, submission of specimen other than nasopharyngeal swab, presence of viral mutation(s) within the areas targeted by this assay, and inadequate number of viral  copies (<250 copies / mL). A negative result must be combined with clinical observations, patient history, and epidemiological information.  Fact Sheet for Patients:   https://www.patel.info/  Fact Sheet for Healthcare Providers: https://hall.com/  This test is not yet approved or  cleared by the [place] FDA and has been authorized for detection and/or diagnosis of SARS-CoV-2 by FDA under an Emergency Use Authorization (EUA).  This EUA will remain in effect (meaning this test can be used) for the duration of the COVID-19 declaration under Section 564(b)(1) of the Act, 21 U.S.C. section 360bbb-3(b)(1), unless the authorization is terminated or revoked sooner.  Performed at [facility], [place] [address] [place], [place] [address]    Radiology Studies: US Abdomen Limited RUQ (LIVER/GB)  Result Date: 06/30/2021 CLINICAL DATA:  Elevated liver function tests EXAM: ULTRASOUND ABDOMEN LIMITED RIGHT UPPER QUADRANT  COMPARISON:  CT 03/20/2016 FINDINGS: Gallbladder: No gallstones or wall thickening visualized. No sonographic Murphy sign noted by sonographer. Common bile duct: Diameter: 6 mm in proximal diameter Liver: Liver size is enlarged. Hepatic parenchymal echogenicity is diffusely increased and there is poor acoustic through transmission in keeping with moderate hepatic steatosis. No focal intrahepatic masses are seen and there is no intrahepatic biliary ductal dilation. Portal vein is patent on color Doppler imaging with normal direction of blood flow towards the liver. Other: No ascites IMPRESSION: Hepatomegaly and hepatic steatosis. Electronically Signed   By: Fidela Salisbury M.D.   On: 06/30/2021 19:00    Scheduled Meds:  buprenorphine  8 mg Sublingual Q8H   chlordiazePOXIDE  25 mg Oral QID   Followed by   chlordiazePOXIDE  25 mg Oral TID   Followed by   Derrill Memo ON 07/03/2021] chlordiazePOXIDE  25 mg Oral BH-qamhs   Followed by   Derrill Memo ON 07/04/2021] chlordiazePOXIDE  25 mg Oral Daily   hydrochlorothiazide  25 mg Oral Daily   insulin aspart  0-15 Units Subcutaneous TID WC   losartan  100 mg Oral Daily   metFORMIN  1,000 mg Oral BID   pantoprazole  40 mg Oral Daily   prazosin  2 mg Oral QHS   QUEtiapine  150 mg Oral QHS   sertraline  100 mg Oral Daily   thiamine  100 mg Oral Daily     LOS: 2 days   Time spent: 79mn  Ojani Berenson C Caydance Kuehnle, DO [facility]  If 7PM-7AM, please contact night-coverage [email or web address]  07/02/2021, 8:29 AM

## 2021-07-03 DIAGNOSIS — E8729 Other acidosis: Secondary | ICD-10-CM | POA: Diagnosis not present

## 2021-07-03 DIAGNOSIS — F10930 Alcohol use, unspecified with withdrawal, uncomplicated: Secondary | ICD-10-CM | POA: Diagnosis not present

## 2021-07-03 LAB — GLUCOSE, CAPILLARY
Glucose-Capillary: 227 mg/dL — ABNORMAL HIGH (ref 70–99)
Glucose-Capillary: 159 mg/dL — ABNORMAL HIGH (ref 70–99)

## 2021-07-03 MED ORDER — BLOOD GLUCOSE MONITOR KIT: PACK | 0 refills | Status: AC

## 2021-07-03 MED ORDER — CHLORDIAZEPOXIDE HCL 25 MG PO CAPS: 25.0000 mg | ORAL_CAPSULE | Freq: Every day | ORAL | 0 refills | Status: DC

## 2021-07-03 MED ORDER — HYDROCHLOROTHIAZIDE 50 MG PO TABS
50.0000 mg | ORAL_TABLET | Freq: Every day | ORAL | Status: DC
Start: 2021-07-04 — End: 2021-08-08

## 2021-07-03 MED ORDER — PEN NEEDLES 32G X 4 MM MISC: 1.0000 | Freq: Three times a day (TID) | 0 refills | Status: DC

## 2021-07-03 MED ORDER — CHLORDIAZEPOXIDE HCL 25 MG PO CAPS: 25.0000 mg | ORAL_CAPSULE | Freq: Three times a day (TID) | ORAL | 0 refills | Status: DC

## 2021-07-03 MED ORDER — INSULIN GLARGINE 100 UNIT/ML SOLOSTAR PEN: 15.0000 [IU] | PEN_INJECTOR | Freq: Every day | SUBCUTANEOUS | 1 refills | Status: DC

## 2021-07-03 MED ORDER — CHLORDIAZEPOXIDE HCL 25 MG PO CAPS: 25.0000 mg | ORAL_CAPSULE | ORAL | 0 refills | Status: DC

## 2021-07-03 NOTE — Discharge Summary (Signed)
Physician Discharge Summary  [name] [name]:[id number] DOB: Sep 02, 1960 DOA: 06/30/2021  PCP: Camillia Herter, NP  Admit date: 06/30/2021 Discharge date: 07/03/2021  Admitted From: Home Disposition:  Home  Recommendations for Outpatient Follow-up:  Follow up with PCP in 1-2 weeks Follow-up with psychiatry as discussed  Home Health: None Equipment/Devices: None  Discharge Condition: Stable CODE STATUS: Full Diet recommendation: Low-salt low-fat diabetic diet  Brief/Interim Summary: JAYIN DEROUSSE is a 61 y.o. male with medical history significant of bipolar disorder, alcohol abuse, history of narcotic abuse on Suboxone, HTN, IIDM, came with nauseous vomiting, loss of appetite, and alcohol withdrawal. Reports to me a liter of vodka daily on average - sometimes more.   Patient admitted for acute withdrawal from alcohol use and abuse, continue to have withdrawal symptoms initially requiring both Librium taper as well as as needed Ativan via CIWA protocol.  Fortunately patient responded well to dual treatment and is now stable otherwise tolerating p.o. without any further tremors or symptoms.  Requesting discharge home on remainder of Librium taper with close follow-up outpatient with PCP and psychiatrist as scheduled which is certainly reasonable.  At this time will discharge patient home on remainder of Librium taper as outlined below, patient stay was complicated by hyperglycemia with markedly uncontrolled diabetes type 2.  Will discharge home on baseline Lantus with hopeful titration of his home medications and diet over the next few weeks with subsequent improvement in insulin burden and he may be able to wean off of insulin however with an A1c of 9.6 he would likely need to continue insulin if he does not make improvements in his home regimen and dietary intake.  Otherwise patient's liver enzymes remain moderately elevated in the setting of alcohol abuse, follow outpatient to ensure  resolution versus further GI work-up but for now is asymptomatic without overt ascites abdominal pain or difficulty taking p.o.  Hypertension previously uncontrolled improved with increased dose of losartan and HCTZ.   Discharge Diagnoses:  Principal Problem:   Alcohol withdrawal ([place]) Active Problems:   Alcoholic ketoacidosis    Discharge Instructions  Discharge Instructions     Discharge patient   Complete by: As directed    Discharge disposition: 01-Home or Self Care   Discharge patient date: 07/03/2021      Allergies as of 07/03/2021       Reactions   Heparin Other (See Comments)   HIT Ab negative on 02/20/15, but SRA POSITIVE    Oxytetracycline Rash, Other (See Comments)   Del-mycin [erythromycin] Other (See Comments)   All mycin drugs - unknown reaction   Sulfa Antibiotics Other (See Comments)   Unknown reaction        Medication List     STOP taking these medications    freestyle lancets   Syringe/Needle (Disp) 30G X 1/2" 1 ML Misc       TAKE these medications    atorvastatin 80 MG tablet Commonly known as: LIPITOR Take 1 tablet (80 mg total) by mouth daily.   blood glucose meter kit and supplies Kit Dispense based on patient and insurance preference. Use up to four times daily as directed.   buprenorphine 8 MG Subl SL tablet Commonly known as: SUBUTEX Place 8 mg under the tongue every 8 (eight) hours.   chlordiazePOXIDE 25 MG capsule Commonly known as: LIBRIUM Take 1 capsule (25 mg total) by mouth 3 (three) times daily. Start taking on: July 04, 2021   chlordiazePOXIDE 25 MG capsule Commonly known as: LIBRIUM  Take 1 capsule (25 mg total) by mouth 2 (two) times daily in the am and at bedtime.. Start taking on: July 05, 2021   chlordiazePOXIDE 25 MG capsule Commonly known as: LIBRIUM Take 1 capsule (25 mg total) by mouth daily. Start taking on: July 08, 2021   fenofibrate 145 MG tablet Commonly known as: TRICOR Take 1 tablet (145 mg  total) by mouth daily.   furosemide 20 MG tablet Commonly known as: LASIX Take 1 tablet (20 mg total) by mouth daily for 7 days. What changed:  when to take this reasons to take this   hydrochlorothiazide 25 MG tablet Commonly known as: HYDRODIURIL TAKE 1 TABLET(25 MG) BY MOUTH DAILY What changed:  how much to take how to take this when to take this additional instructions   hydrochlorothiazide 50 MG tablet Commonly known as: HYDRODIURIL Take 1 tablet (50 mg total) by mouth daily. Start taking on: July 04, 2021 What changed: You were already taking a medication with the same name, and this prescription was added. Make sure you understand how and when to take each.   insulin glargine 100 UNIT/ML Solostar Pen Commonly known as: LANTUS Inject 15 Units into the skin at bedtime.   losartan 100 MG tablet Commonly known as: COZAAR TAKE 1 TABLET(100 MG) BY MOUTH DAILY What changed:  how much to take how to take this when to take this additional instructions   metFORMIN 500 MG 24 hr tablet Commonly known as: GLUCOPHAGE-XR Take 2 tablets (1,000 mg total) by mouth 2 (two) times daily. What changed:  how much to take when to take this   omeprazole 20 MG tablet Commonly known as: PRILOSEC OTC Take 40 mg by mouth daily.   Pen Needles 32G X 4 MM Misc 1 each by Does not apply route 3 (three) times daily before meals.   prazosin 2 MG capsule Commonly known as: MINIPRESS Take 2 mg by mouth at bedtime.   QUEtiapine 300 MG tablet Commonly known as: SEROQUEL Take 150 mg by mouth at bedtime.   sertraline 100 MG tablet Commonly known as: ZOLOFT Take 100 mg by mouth daily.   Trulicity 3.54 TG/2.5WL Sopn Generic drug: Dulaglutide Inject 0.75 mg into the skin once a week. What changed: when to take this        Allergies  Allergen Reactions   Heparin Other (See Comments)    HIT Ab negative on 02/20/15, but SRA POSITIVE    Oxytetracycline Rash and Other (See Comments)    Del-Mycin [Erythromycin] Other (See Comments)    All mycin drugs - unknown reaction   Sulfa Antibiotics Other (See Comments)    Unknown reaction    Consultations: None  Procedures/Studies: US Abdomen Limited RUQ (LIVER/GB)  Result Date: 06/30/2021 CLINICAL DATA:  Elevated liver function tests EXAM: ULTRASOUND ABDOMEN LIMITED RIGHT UPPER QUADRANT COMPARISON:  CT 03/20/2016 FINDINGS: Gallbladder: No gallstones or wall thickening visualized. No sonographic Murphy sign noted by sonographer. Common bile duct: Diameter: 6 mm in proximal diameter Liver: Liver size is enlarged. Hepatic parenchymal echogenicity is diffusely increased and there is poor acoustic through transmission in keeping with moderate hepatic steatosis. No focal intrahepatic masses are seen and there is no intrahepatic biliary ductal dilation. Portal vein is patent on color Doppler imaging with normal direction of blood flow towards the liver. Other: No ascites IMPRESSION: Hepatomegaly and hepatic steatosis. Electronically Signed   By: Fidela Salisbury M.D.   On: 06/30/2021 19:00     Subjective: No acute issues or  events overnight denies nausea vomiting diarrhea constipation headache fevers chills chest pain or tremors.  Anxiety currently well controlled.   Discharge Exam: Vitals:   07/03/21 0526 07/03/21 0950  BP: (!) 146/77   Pulse: 73 73  Resp: 16 17  Temp: 97.9 F (36.6 C) 98.1 F (36.7 C)  SpO2:  95%   Vitals:   07/03/21 0100 07/03/21 0400 07/03/21 0526 07/03/21 0950  BP:  (!) 150/70 (!) 146/77   Pulse: 68 70 73 73  Resp:   16 17  Temp:   97.9 F (36.6 C) 98.1 F (36.7 C)  TempSrc:   Oral Oral  SpO2:    95%  Weight:      Height:        General: Pt is alert, awake, not in acute distress Cardiovascular: RRR, S1/S2 +, no rubs, no gallops Respiratory: CTA bilaterally, no wheezing, no rhonchi Abdominal: Soft, NT, ND, bowel sounds + Extremities: no edema, no cyanosis    The results of significant  diagnostics from this hospitalization (including imaging, microbiology, ancillary and laboratory) are listed below for reference.     Microbiology: Recent Results (from the past 240 hour(s))  SARS Coronavirus 2 by RT PCR (hospital order, performed in [facility] hospital lab) *cepheid single result test* Urine, Clean Catch     Status: None   Collection Time: 06/30/21  1:38 PM   Specimen: Urine, Clean Catch; Nasal Swab  Result Value Ref Range Status   SARS Coronavirus 2 by RT PCR NEGATIVE NEGATIVE Final    Comment: (NOTE) SARS-CoV-2 target nucleic acids are NOT DETECTED.  The SARS-CoV-2 RNA is generally detectable in upper and lower respiratory specimens during the acute phase of infection. The lowest concentration of SARS-CoV-2 viral copies this assay can detect is 250 copies / mL. A negative result does not preclude SARS-CoV-2 infection and should not be used as the sole basis for treatment or other patient management decisions.  A negative result may occur with improper specimen collection / handling, submission of specimen other than nasopharyngeal swab, presence of viral mutation(s) within the areas targeted by this assay, and inadequate number of viral copies (<250 copies / mL). A negative result must be combined with clinical observations, patient history, and epidemiological information.  Fact Sheet for Patients:   https://www.patel.info/  Fact Sheet for Healthcare Providers: https://hall.com/  This test is not yet approved or  cleared by the [place] FDA and has been authorized for detection and/or diagnosis of SARS-CoV-2 by FDA under an Emergency Use Authorization (EUA).  This EUA will remain in effect (meaning this test can be used) for the duration of the COVID-19 declaration under Section 564(b)(1) of the Act, 21 U.S.C. section 360bbb-3(b)(1), unless the authorization is terminated or revoked sooner.  Performed at [facility], [place] [address], [place], [place] [address]      Labs: BNP (last 3 results) Recent Labs    12/06/20 1649  BNP 53.6   Basic Metabolic Panel: Recent Labs  Lab 06/30/21 1338 06/30/21 1510 06/30/21 1557 07/01/21 0330 07/02/21 0512  NA 140 139 138 136 137  K 3.9 4.1 4.2 3.9 3.3*  CL 103 103  --  103 102  CO2 11* 14*  --  26 25  GLUCOSE 229* 214*  --  254* 249*  BUN 17 15  --  17 14  CREATININE 1.13 0.98  --  1.06 1.04  CALCIUM 8.3* 8.1*  --  8.3* 8.9  MG 2.1  --   --   --   --  Liver Function Tests: Recent Labs  Lab 06/30/21 1338 07/01/21 0330 07/02/21 0512  AST 153* 94* 89*  ALT 73* 59* 58*  ALKPHOS 57 44 45  BILITOT 0.9 1.5* 0.8  PROT 6.9 5.9* 5.9*  ALBUMIN 3.9 3.3* 3.3*   No results for input(s): "LIPASE", "AMYLASE" in the last 168 hours. No results for input(s): "AMMONIA" in the last 168 hours. CBC: Recent Labs  Lab 06/30/21 1338 06/30/21 1557 07/02/21 0512  WBC 4.5  --  2.7*  NEUTROABS 3.8  --   --   HGB 13.7 13.9 13.3  HCT 41.7 41.0 38.1*  MCV 85.6  --  82.1  PLT 106*  --  66*   Cardiac Enzymes: No results for input(s): "CKTOTAL", "CKMB", "CKMBINDEX", "TROPONINI" in the last 168 hours. BNP: Invalid input(s): "POCBNP" CBG: Recent Labs  Lab 07/02/21 1149 07/02/21 1605 07/02/21 2059 07/03/21 0745 07/03/21 1111  GLUCAP 180* 193* 227* 227* 159*   D-Dimer No results for input(s): "DDIMER" in the last 72 hours. Hgb A1c No results for input(s): "HGBA1C" in the last 72 hours. Lipid Profile No results for input(s): "CHOL", "HDL", "LDLCALC", "TRIG", "CHOLHDL", "LDLDIRECT" in the last 72 hours. Thyroid function studies No results for input(s): "TSH", "T4TOTAL", "T3FREE", "THYROIDAB" in the last 72 hours.  Invalid input(s): "FREET3" Anemia work up No results for input(s): "VITAMINB12", "FOLATE", "FERRITIN", "TIBC", "IRON", "RETICCTPCT" in the last 72 hours. Urinalysis    Component Value Date/Time   COLORURINE YELLOW 04/27/2020  2050   APPEARANCEUR CLEAR 04/27/2020 2050   LABSPEC 1.019 04/27/2020 2050   PHURINE 5.0 04/27/2020 2050   GLUCOSEU 50 (A) 04/27/2020 2050   HGBUR NEGATIVE 04/27/2020 2050   BILIRUBINUR NEGATIVE 04/27/2020 2050   KETONESUR NEGATIVE 04/27/2020 2050   PROTEINUR NEGATIVE 04/27/2020 2050   UROBILINOGEN 0.2 05/18/2008 1401   NITRITE NEGATIVE 04/27/2020 2050   LEUKOCYTESUR NEGATIVE 04/27/2020 2050   Sepsis Labs Recent Labs  Lab 06/30/21 1338 07/02/21 0512  WBC 4.5 2.7*   Microbiology Recent Results (from the past 240 hour(s))  SARS Coronavirus 2 by RT PCR (hospital order, performed in [facility] hospital lab) *cepheid single result test* Urine, Clean Catch     Status: None   Collection Time: 06/30/21  1:38 PM   Specimen: Urine, Clean Catch; Nasal Swab  Result Value Ref Range Status   SARS Coronavirus 2 by RT PCR NEGATIVE NEGATIVE Final    Comment: (NOTE) SARS-CoV-2 target nucleic acids are NOT DETECTED.  The SARS-CoV-2 RNA is generally detectable in upper and lower respiratory specimens during the acute phase of infection. The lowest concentration of SARS-CoV-2 viral copies this assay can detect is 250 copies / mL. A negative result does not preclude SARS-CoV-2 infection and should not be used as the sole basis for treatment or other patient management decisions.  A negative result may occur with improper specimen collection / handling, submission of specimen other than nasopharyngeal swab, presence of viral mutation(s) within the areas targeted by this assay, and inadequate number of viral copies (<250 copies / mL). A negative result must be combined with clinical observations, patient history, and epidemiological information.  Fact Sheet for Patients:   https://www.patel.info/  Fact Sheet for Healthcare Providers: https://hall.com/  This test is not yet approved or  cleared by the [place] FDA and has been authorized for  detection and/or diagnosis of SARS-CoV-2 by FDA under an Emergency Use Authorization (EUA).  This EUA will remain in effect (meaning this test can be used) for the duration of the  COVID-19 declaration under Section 564(b)(1) of the Act, 21 U.S.C. section 360bbb-3(b)(1), unless the authorization is terminated or revoked sooner.  Performed at [facility], [place] [address] [place],  [address]      Time coordinating discharge: Over 30 minutes  SIGNED:   Little Ishikawa, DO [facility] 07/03/2021, 3:16 PM Pager   If 7PM-7AM, please contact night-coverage [email or web address]

## 2021-07-03 NOTE — Care Management Important Message (Signed)
Important Message  Patient Details  Name: LAMEL MCCARLEY MRN: [id number] Date of Birth: [birth year]   Medicare Important Message Given:  Yes     Shelda Altes 07/03/2021, 7:40 AM

## 2021-07-03 NOTE — Inpatient Diabetes Management (Addendum)
Inpatient Diabetes Program Recommendations  AACE/ADA: New Consensus Statement on Inpatient Glycemic Control (2015)  Target Ranges:  Prepandial:   less than 140 mg/dL      Peak postprandial:   less than 180 mg/dL (1-2 hours)      Critically ill patients:  140 - 180 mg/dL    Latest Reference Range & Units 07/02/21 08:04 07/02/21 11:49 07/02/21 16:05 07/02/21 20:59  Glucose-Capillary 70 - 99 mg/dL 324 (H)  11 units Novolog  180 (H)  3 units Novolog  193 (H)  3 units Novolog  15 units Semglee 227 (H)  (H): Data is abnormally high  Latest Reference Range & Units 07/03/21 07:45  Glucose-Capillary 70 - 99 mg/dL 227 (H)  8 units Novolog _0   (H): Data is abnormally high   Admit with:  Acute withdrawals in the setting of alcohol abuse  Concurrent alcoholic ketoacidosis   History: DM  Home DM Meds: Trulicity 0.45 mg Qweek       Metformin 500 mg QID          Current Orders: Semglee 15 units QHS      Metformin 1000 mg BID      Novolog Moderate Correction Scale/ SSI (0-15 units) TID AC     PCP: Durene Fruits, NP at [facility] seen 01/25/2021 Was not taking meds consistently--Meds re-prescribed   Per MD notes, may need Insulin for home   MD- Note Semglee started yesterday PM.  CBG 227 this AM.  Please consider increasing the Semglee to 20 units QHS  Please also recheck Current A1c level (last A1c on file was 9.6% Jan 2023)  Addendum 11:20am-- Met with pt at bedside.  Pt told me his PCP prescribed Trulicity Qweek + Metformin BID back in January 2023.  Prior to that, pt was not taking any meds for his diabetes consistently.  Pt told me he was very involved in caring for his sick parents in 2022 and neglected to take care of himself.  Started abusing alcohol in 2020/12/04 after his mother died.  Has NOT taken his Diabetes Meds since January 2023.  Told me today he has re-committed to taking care of himself and stated he does not plan to use alcohol  anymore.  He also told me he is committed to taking his home diabetes meds consistently and is committed to exercising and losing weight.  We reviewed basic diabetes diet info--pt told me he has been taught how to carb count and read labels.  We reviewed these 2 skills and pt seemed able to do both proficiently.  Encouraged pt to avoid any beverages with carbohydrates/sugar.  Reviewed using Trulicity at home.  Pt stated comfort with giving injections and knows to rotate injection sites.  We also reviewed how alcohol affects the body and liver--pt stated commitment to stopping alcohol completely.  Pt has 3 doses of Trulicity at home and they are stored in the fridge.  Has CBG meter at home as well.  Plans to follow up with PCP at [facility] after d/c.    --Will follow patient during hospitalization--  Wyn Quaker RN, MSN, CDE Diabetes Coordinator Inpatient Glycemic Control Team Team Pager: [phone] (8a-5p)

## 2021-07-04 ENCOUNTER — Telehealth: Payer: Self-pay

## 2021-07-04 NOTE — Telephone Encounter (Signed)
Transition Care Management Follow-up Telephone Call Date of discharge and from where: 6/21/20232, [facility] How have you been since you were released from the hospital? He said he is feeling pretty good. Any questions or concerns? No  Items Reviewed: Did the pt receive and understand the discharge instructions provided? Yes - he said he reviewed them and did not have any questions.  Medications obtained and verified? Yes - he said he has all of his medications except the new order for hydrodiuril. He plans to pick that up today because the pharmacy did not have it yesterday.  He has the insulin and has administered insulin in the past. He also has a working glucometer.  Other? No  Any new allergies since your discharge? No  Dietary orders reviewed? Yes Do you have support at home?  Lives alone  Home Care and Equipment/Supplies: Were home health services ordered? no If so, what is the name of the agency? N/a  Has the agency set up a time to come to the patient's home? not applicable Were any new equipment or medical supplies ordered?  No What is the name of the medical supply agency? N/a Were you able to get the supplies/equipment? not applicable Do you have any questions related to the use of the equipment or supplies? No  Functional Questionnaire: (I = Independent and D = Dependent) ADLs: independent  Follow up appointments reviewed:  PCP Hospital f/u appt confirmed? Yes  Scheduled to see Durene Fruits, NP - 07/17/2021  Specialist Hospital f/u appt confirmed? Yes  Scheduled to see endocrinology - 09/19/2021,  Are transportation arrangements needed? No  If their condition worsens, is the pt aware to call PCP or go to the Emergency Dept.? Yes Was the patient provided with contact information for the PCP's office or ED? Yes Was to pt encouraged to call back with questions or concerns? Yes

## 2021-07-08 ENCOUNTER — Other Ambulatory Visit: Payer: Self-pay

## 2021-07-08 NOTE — Progress Notes (Signed)
Erroneous encounter-disregard

## 2021-07-09 ENCOUNTER — Emergency Department (HOSPITAL_COMMUNITY): Payer: Medicare Other

## 2021-07-09 ENCOUNTER — Emergency Department (HOSPITAL_COMMUNITY)
Admission: EM | Admit: 2021-07-09 | Discharge: 2021-07-10 | Disposition: A | Discharge status: Home / Self Care | Payer: Medicare Other | Attending: Emergency Medicine | Admitting: Emergency Medicine

## 2021-07-09 ENCOUNTER — Other Ambulatory Visit: Payer: Self-pay

## 2021-07-09 ENCOUNTER — Encounter (HOSPITAL_COMMUNITY): Payer: Self-pay

## 2021-07-09 DIAGNOSIS — F10129 Alcohol abuse with intoxication, unspecified: Secondary | ICD-10-CM | POA: Diagnosis present

## 2021-07-09 DIAGNOSIS — F10920 Alcohol use, unspecified with intoxication, uncomplicated: Secondary | ICD-10-CM

## 2021-07-09 DIAGNOSIS — R600 Localized edema: Secondary | ICD-10-CM | POA: Diagnosis not present

## 2021-07-09 DIAGNOSIS — I10 Essential (primary) hypertension: Secondary | ICD-10-CM | POA: Diagnosis not present

## 2021-07-09 DIAGNOSIS — Z79899 Other long term (current) drug therapy: Secondary | ICD-10-CM | POA: Insufficient documentation

## 2021-07-09 DIAGNOSIS — I700 Atherosclerosis of aorta: Secondary | ICD-10-CM | POA: Diagnosis not present

## 2021-07-09 DIAGNOSIS — E119 Type 2 diabetes mellitus without complications: Secondary | ICD-10-CM | POA: Diagnosis not present

## 2021-07-09 DIAGNOSIS — Z794 Long term (current) use of insulin: Secondary | ICD-10-CM | POA: Diagnosis not present

## 2021-07-09 DIAGNOSIS — I517 Cardiomegaly: Secondary | ICD-10-CM | POA: Diagnosis not present

## 2021-07-09 DIAGNOSIS — R4182 Altered mental status, unspecified: Secondary | ICD-10-CM | POA: Diagnosis not present

## 2021-07-09 DIAGNOSIS — Y908 Blood alcohol level of 240 mg/100 ml or more: Secondary | ICD-10-CM | POA: Insufficient documentation

## 2021-07-09 DIAGNOSIS — Z20822 Contact with and (suspected) exposure to covid-19: Secondary | ICD-10-CM | POA: Diagnosis not present

## 2021-07-09 DIAGNOSIS — Z7984 Long term (current) use of oral hypoglycemic drugs: Secondary | ICD-10-CM | POA: Diagnosis not present

## 2021-07-09 DIAGNOSIS — R609 Edema, unspecified: Secondary | ICD-10-CM | POA: Diagnosis not present

## 2021-07-09 LAB — URINALYSIS, ROUTINE W REFLEX MICROSCOPIC
Bacteria, UA: NONE SEEN
Bilirubin Urine: NEGATIVE
Glucose, UA: >=500 mg/dL — AB
Hgb urine dipstick: NEGATIVE
Ketones, ur: NEGATIVE mg/dL
Leukocytes,Ua: NEGATIVE
Nitrite: NEGATIVE
Protein, ur: NEGATIVE mg/dL
Specific Gravity, Urine: 1.003 — ABNORMAL LOW (ref 1.005–1.030)
pH: 6 (ref 5.0–8.0)

## 2021-07-09 LAB — RESP PANEL BY RT-PCR (FLU A&B, COVID) ARPGX2
Influenza A by PCR: NEGATIVE
Influenza B by PCR: NEGATIVE
SARS Coronavirus 2 by RT PCR: NEGATIVE

## 2021-07-09 LAB — RAPID URINE DRUG SCREEN, HOSP PERFORMED
Amphetamines: NOT DETECTED
Barbiturates: NOT DETECTED
Benzodiazepines: POSITIVE — AB
Cocaine: NOT DETECTED
Opiates: NOT DETECTED
Tetrahydrocannabinol: NOT DETECTED

## 2021-07-09 MED ORDER — LACTATED RINGERS IV BOLUS
1000.0000 mL | Freq: Once | INTRAVENOUS | Status: AC
  Administered 2021-07-09: 1000 mL via INTRAVENOUS

## 2021-07-09 MED ORDER — THIAMINE HCL 100 MG/ML IJ SOLN
100.0000 mg | Freq: Once | INTRAMUSCULAR | Status: AC
  Administered 2021-07-09: 100 mg via INTRAVENOUS
  Filled 2021-07-09: qty 2

## 2021-07-09 NOTE — ED Provider Notes (Signed)
[facility] DEPT Provider Note   CSN: [id number] Arrival date & time: 07/09/21  2049     History  No chief complaint on file.   [name] [name] is a 61 y.o. male.  HPI Patient presents for alcohol intoxication.  Medical history includes HTN, chronic pain, T2DM, HLD, depression, arthritis, alcohol abuse, alcohol withdrawal.  Per EMS, patient called his daughter-in-law by telephone and was saying "help me, help me".  Daughter-in-law subsequently called 911.  EMS noted an empty 7 or 50 mL bottle of liquor on scene.  Patient states that he currently drinks 1/5 of alcohol per day.  He has reportedly been noncompliant with his home medications.  Patient currently denies any physical pain.  He repeatedly states that he needs to "see his brother".  Further history is limited by the patient's current state of intoxication.    Home Medications Prior to Admission medications   Medication Sig Start Date End Date Taking? Authorizing Provider  atorvastatin (LIPITOR) 80 MG tablet Take 1 tablet (80 mg total) by mouth daily. 01/26/21 07/24/21  Camillia Herter, NP  blood glucose meter kit and supplies KIT Dispense based on patient and insurance preference. Use up to four times daily as directed. 07/03/21   Little Ishikawa, MD  buprenorphine (SUBUTEX) 8 MG SUBL SL tablet Place 8 mg under the tongue every 8 (eight) hours.    [provider]  chlordiazePOXIDE (LIBRIUM) 25 MG capsule Take 1 capsule (25 mg total) by mouth 3 (three) times daily. 07/04/21   Little Ishikawa, MD  chlordiazePOXIDE (LIBRIUM) 25 MG capsule Take 1 capsule (25 mg total) by mouth 2 (two) times daily in the am and at bedtime.. 07/05/21   Little Ishikawa, MD  chlordiazePOXIDE (LIBRIUM) 25 MG capsule Take 1 capsule (25 mg total) by mouth daily. 07/08/21   Little Ishikawa, MD  Dulaglutide (TRULICITY) 9.15 AV/6.9VX SOPN Inject 0.75 mg into the skin once a week. Patient taking  differently: Inject 0.75 mg into the skin every Monday. 01/25/21   Camillia Herter, NP  fenofibrate (TRICOR) 145 MG tablet Take 1 tablet (145 mg total) by mouth daily. Patient not taking: Reported on 07/01/2021 06/19/20   Nicolette Bang, MD  furosemide (LASIX) 20 MG tablet Take 1 tablet (20 mg total) by mouth daily for 7 days. Patient taking differently: Take 20 mg by mouth daily as needed for fluid or edema. 01/25/21 07/01/21  Camillia Herter, NP  hydrochlorothiazide (HYDRODIURIL) 25 MG tablet TAKE 1 TABLET(25 MG) BY MOUTH DAILY Patient taking differently: Take 25 mg by mouth daily. 01/25/21   Camillia Herter, NP  hydrochlorothiazide (HYDRODIURIL) 50 MG tablet Take 1 tablet (50 mg total) by mouth daily. 07/04/21   Little Ishikawa, MD  insulin glargine (LANTUS) 100 UNIT/ML Solostar Pen Inject 15 Units into the skin at bedtime. 07/03/21   Little Ishikawa, MD  Insulin Pen Needle (PEN NEEDLES) 32G X 4 MM MISC 1 each by Does not apply route 3 (three) times daily before meals. 07/03/21 08/02/21  Little Ishikawa, MD  losartan (COZAAR) 100 MG tablet TAKE 1 TABLET(100 MG) BY MOUTH DAILY Patient taking differently: Take 100 mg by mouth daily. 01/25/21   Camillia Herter, NP  metFORMIN (GLUCOPHAGE-XR) 500 MG 24 hr tablet Take 2 tablets (1,000 mg total) by mouth 2 (two) times daily. Patient taking differently: Take 500 mg by mouth in the morning, at noon, in the evening, and at bedtime. 01/25/21 07/24/21  Minette Brine, Amy J, NP  omeprazole (PRILOSEC OTC) 20 MG tablet Take 40 mg by mouth daily.    [provider]  prazosin (MINIPRESS) 2 MG capsule Take 2 mg by mouth at bedtime.    [provider]  QUEtiapine (SEROQUEL) 300 MG tablet Take 150 mg by mouth at bedtime.    [provider]  sertraline (ZOLOFT) 100 MG tablet Take 100 mg by mouth daily. 04/09/20   [provider]      Allergies    Heparin, Oxytetracycline, Del-mycin [erythromycin], and Sulfa antibiotics     Review of Systems   Review of Systems  Unable to perform ROS: Mental status change    Physical Exam Updated Vital Signs BP 117/62   Pulse 60   Temp 97.9 F (36.6 C) (Oral)   Resp 17   SpO2 97%  Physical Exam Vitals and nursing note reviewed.  Constitutional:      General: He is not in acute distress.    Appearance: He is well-developed. He is not toxic-appearing or diaphoretic.  HENT:     Head: Normocephalic and atraumatic.     Right Ear: External ear normal.     Left Ear: External ear normal.     Nose: Nose normal.     Mouth/Throat:     Mouth: Mucous membranes are moist.     Pharynx: Oropharynx is clear.  Eyes:     Extraocular Movements: Extraocular movements intact.     Conjunctiva/sclera: Conjunctivae normal.  Cardiovascular:     Rate and Rhythm: Normal rate and regular rhythm.     Heart sounds: No murmur heard. Pulmonary:     Effort: Pulmonary effort is normal. No respiratory distress.     Breath sounds: Normal breath sounds. No wheezing or rales.  Chest:     Chest wall: No tenderness.  Abdominal:     Palpations: Abdomen is soft.     Tenderness: There is no abdominal tenderness.  Musculoskeletal:        General: No swelling, deformity or signs of injury. Normal range of motion.     Cervical back: Normal range of motion and neck supple. No rigidity.     Right lower leg: Edema present.     Left lower leg: Edema present.  Skin:    General: Skin is warm and dry.     Capillary Refill: Capillary refill takes less than 2 seconds.     Findings: Erythema (Distal BLE, greater on the right) present.  Neurological:     General: No focal deficit present.     Mental Status: He is alert. He is disoriented.     Cranial Nerves: No cranial nerve deficit.     Sensory: No sensory deficit.     Motor: No weakness.     Coordination: Coordination normal.  Psychiatric:        Mood and Affect: Mood is anxious. Affect is tearful.        Speech: Speech is slurred.         Behavior: Behavior is agitated.     ED Results / Procedures / Treatments   Labs (all labs ordered are listed, but only abnormal results are displayed) Labs Reviewed  RAPID URINE DRUG SCREEN, HOSP PERFORMED - Abnormal; Notable for the following components:      Result Value   Benzodiazepines POSITIVE (*)    All other components within normal limits  URINALYSIS, ROUTINE W REFLEX MICROSCOPIC - Abnormal; Notable for the following components:   Color, Urine STRAW (*)  Specific Gravity, Urine 1.003 (*)    Glucose, UA >=500 (*)    All other components within normal limits  RESP PANEL BY RT-PCR (FLU A&B, COVID) ARPGX2  COMPREHENSIVE METABOLIC PANEL  ETHANOL  CBC WITH DIFFERENTIAL/PLATELET  ACETAMINOPHEN LEVEL  SALICYLATE LEVEL  MAGNESIUM    EKG None  Radiology DG Chest Port 1 View  Result Date: 07/09/2021 CLINICAL DATA:  Bilateral lower extremity edema, noncompliant on medication. EXAM: PORTABLE CHEST 1 VIEW COMPARISON:  Chest PA Lat 12/06/2020. FINDINGS: The left CP sulcus was excluded from the exam. The lungs are clear. The visualized sulci are sharp. There is mild cardiomegaly without evidence of CHF. There is no evidence of interstitial edema. Trace calcification in the aortic arch with mild aortic uncoiling. The mediastinal configuration is stable. No acute osseous abnormality is seen. No pneumothorax. IMPRESSION: No evidence of acute chest disease or significant changes. Mild cardiomegaly. Aortic atherosclerosis. Electronically Signed   By: Telford Nab M.D.   On: 07/09/2021 22:15   CT Head Wo Contrast  Result Date: 07/09/2021 CLINICAL DATA:  Altered mental status. EXAM: CT HEAD WITHOUT CONTRAST TECHNIQUE: Contiguous axial images were obtained from the base of the skull through the vertex without intravenous contrast. RADIATION DOSE REDUCTION: This exam was performed according to the departmental dose-optimization program which includes automated exposure control, adjustment of  the mA and/or kV according to patient size and/or use of iterative reconstruction technique. COMPARISON:  Head CT 11/25/2016 FINDINGS: Brain: No intracranial hemorrhage. No subdural or extra-axial collection. There is progressive enlargement of the lateral ventricles from prior exam with rounding of the temporal horns, no evidence of obstructing lesion. The basilar cisterns are patent. Minimal ill-defined low-density in the right cerebellum, series 3, image 9, new from prior exam, age indeterminate ischemia. There is no associated mass effect. No midline shift. Vascular: Atherosclerosis of skullbase vasculature without hyperdense vessel or abnormal calcification. Skull: No fracture or focal lesion. Sinuses/Orbits: Mild mucosal thickening of ethmoid air cells, no sinus fluid levels. No mastoid effusion. Other: None. IMPRESSION: 1. Minimal ill-defined low-density in the right cerebellum, new from 2018, consistent with age indeterminate ischemia. No associated mass effect. 2. Progressive enlargement of the lateral ventricles from prior with rounding of the temporal horns, no evidence of obstructing lesion. This may be secondary to central atrophy, however recommend clinical correlation for symptoms of normal pressure hydrocephalus. Electronically Signed   By: Keith Rake M.D.   On: 07/09/2021 21:43    Procedures Procedures    Medications Ordered in ED Medications  lactated ringers bolus 1,000 mL (1,000 mLs Intravenous New Bag/Given 07/09/21 2149)  thiamine (B-1) injection 100 mg (100 mg Intravenous Given 07/09/21 2149)    ED Course/ Medical Decision Making/ A&P                           Medical Decision Making Amount and/or Complexity of Data Reviewed Labs: ordered. Radiology: ordered.  Risk Prescription drug management.   This patient presents to the ED for concern of intoxication, this involves an extensive number of treatment options, and is a complaint that carries with it a high risk of  complications and morbidity.  The differential diagnosis includes alcohol abuse, polysubstance abuse, head injury, polypharmacy, medication withdrawal   Co morbidities that complicate the patient evaluation  HTN, chronic pain, T2DM, HLD, depression, arthritis, alcohol abuse, alcohol withdrawal   Additional history obtained:  Additional history obtained from EMS External records from outside source obtained and reviewed  including EMR   Lab Tests:  I Ordered, and personally interpreted labs.  The pertinent results include: Urine studies show benzo positivity.  Serum labs pending at time of signout   Imaging Studies ordered:  I ordered imaging studies including chest x-ray, CT head I independently visualized and interpreted imaging which showed no acute findings I agree with the radiologist interpretation   Cardiac Monitoring: / EKG:  The patient was maintained on a cardiac monitor.  I personally viewed and interpreted the cardiac monitored which showed an underlying rhythm of: Sinus rhythm   Problem List / ED Course / Critical interventions / Medication management  Patient presents for suspected intoxication.  He reportedly called a family member asking for help.  Family member called 60.  Patient had empty liquor bottles on scene.  He arrives via EMS.  On arrival, patient is tearful and repeating that he needs to speak with his brother.  He does appear intoxicated.  He is able to coherently answer questions and he currently denies any physical symptoms.  He does state that he typically drinks 1/5 of liquor per day.  Patient was placed on bedside cardiac monitor.  Laboratory work-up was initiated.  Patient underwent chest x-ray and CT head which did not show any acute findings.  On reassessment, patient sleeping comfortably with stable vital signs.  Urine studies positive for benzos.  Serum labs are pending at time of signout.  He will require follow-up on lab results and time to  metabolize intoxicating substances.  Care of patient was signed out to oncoming ED provider. I ordered medication including IV fluids and thiamine for alcohol abuse Reevaluation of the patient after these medicines showed that the patient improved I have reviewed the patients home medicines and have made adjustments as needed   Social Determinants of Health:  Alcohol abuse         Final Clinical Impression(s) / ED Diagnoses Final diagnoses:  Alcoholic intoxication without complication [facility])    Rx / DC Orders ED Discharge Orders     None         Godfrey Pick, MD 07/10/21 [phone]

## 2021-07-10 LAB — CBC WITH DIFFERENTIAL/PLATELET
Abs Immature Granulocytes: 0.03 10*3/uL (ref 0.00–0.07)
Basophils Absolute: 0.1 10*3/uL (ref 0.0–0.1)
Basophils Relative: 2 %
Eosinophils Absolute: 0.2 10*3/uL (ref 0.0–0.5)
Eosinophils Relative: 6 %
HCT: 37.7 % — ABNORMAL LOW (ref 39.0–52.0)
Hemoglobin: 13.1 g/dL (ref 13.0–17.0)
Immature Granulocytes: 1 %
Lymphocytes Relative: 46 %
Lymphs Abs: 1.6 10*3/uL (ref 0.7–4.0)
MCH: 28.6 pg (ref 26.0–34.0)
MCHC: 34.7 g/dL (ref 30.0–36.0)
MCV: 82.3 fL (ref 80.0–100.0)
Monocytes Absolute: 0.3 10*3/uL (ref 0.1–1.0)
Monocytes Relative: 10 %
Neutro Abs: 1.2 10*3/uL — ABNORMAL LOW (ref 1.7–7.7)
Neutrophils Relative %: 35 %
Platelets: 161 10*3/uL (ref 150–400)
RBC: 4.58 MIL/uL (ref 4.22–5.81)
RDW: 13.8 % (ref 11.5–15.5)
WBC: 3.4 10*3/uL — ABNORMAL LOW (ref 4.0–10.5)
nRBC: 0 % (ref 0.0–0.2)

## 2021-07-10 LAB — COMPREHENSIVE METABOLIC PANEL
ALT: 63 U/L — ABNORMAL HIGH (ref 0–44)
AST: 75 U/L — ABNORMAL HIGH (ref 15–41)
Albumin: 4 g/dL (ref 3.5–5.0)
Alkaline Phosphatase: 49 U/L (ref 38–126)
Anion gap: 10 (ref 5–15)
BUN: 15 mg/dL (ref 6–20)
CO2: 27 mmol/L (ref 22–32)
Calcium: 8.6 mg/dL — ABNORMAL LOW (ref 8.9–10.3)
Chloride: 105 mmol/L (ref 98–111)
Creatinine, Ser: 0.81 mg/dL (ref 0.61–1.24)
GFR, Estimated: >60 mL/min (ref >60)
Glucose, Bld: 259 mg/dL — ABNORMAL HIGH (ref 70–99)
Potassium: 3.3 mmol/L — ABNORMAL LOW (ref 3.5–5.1)
Sodium: 142 mmol/L (ref 135–145)
Total Bilirubin: 0.7 mg/dL (ref 0.3–1.2)
Total Protein: 7.2 g/dL (ref 6.5–8.1)

## 2021-07-10 LAB — MAGNESIUM: Magnesium: 2.3 mg/dL (ref 1.7–2.4)

## 2021-07-10 LAB — ETHANOL: Alcohol, Ethyl (B): 309 mg/dL (ref <10)

## 2021-07-10 LAB — SALICYLATE LEVEL: Salicylate Lvl: <7 mg/dL — ABNORMAL LOW (ref 7.0–30.0)

## 2021-07-10 LAB — ACETAMINOPHEN LEVEL: Acetaminophen (Tylenol), Serum: <10 ug/mL — ABNORMAL LOW (ref 10–30)

## 2021-07-10 MED ORDER — ONDANSETRON 8 MG PO TBDP
8.0000 mg | ORAL_TABLET | Freq: Once | ORAL | Status: AC
  Administered 2021-07-10: 8 mg via ORAL
  Filled 2021-07-10: qty 1

## 2021-07-10 MED ORDER — CHLORDIAZEPOXIDE HCL 25 MG PO CAPS: ORAL_CAPSULE | ORAL | 0 refills | Status: DC

## 2021-07-10 NOTE — ED Provider Notes (Signed)
  Physical Exam  BP (!) 180/91   Pulse 65   Temp 97.9 F (36.6 C) (Oral)   Resp 13   SpO2 96%   Physical Exam Vitals and nursing note reviewed.  Constitutional:      General: He is not in acute distress.    Appearance: He is well-developed. He is not diaphoretic.  HENT:     Head: Normocephalic and atraumatic.  Cardiovascular:     Rate and Rhythm: Normal rate and regular rhythm.     Heart sounds: No murmur heard.    No friction rub.  Pulmonary:     Effort: Pulmonary effort is normal. No respiratory distress.     Breath sounds: Normal breath sounds. No wheezing or rales.  Abdominal:     General: Bowel sounds are normal. There is no distension.     Palpations: Abdomen is soft.     Tenderness: There is no abdominal tenderness.  Musculoskeletal:        General: Normal range of motion.     Cervical back: Normal range of motion and neck supple.  Skin:    General: Skin is warm and dry.  Neurological:     Mental Status: He is alert and oriented to person, place, and time.     Coordination: Coordination normal.     Procedures  Procedures  ED Course / MDM  Care assumed from Dr. Doren Custard at shift change.  Patient presented here after consuming copious quantities of alcohol.  Blood alcohol today was 309.  Care signed out to me with patient awaiting sobriety.  He is now awake, alert, and ambulatory.  I feel as though he can safely be discharged.       Veryl Speak, MD 07/10/21 [phone]

## 2021-07-10 NOTE — Discharge Instructions (Signed)
Begin taking Librium as prescribed.  The contact information for outpatient alcohol treatment centers has been provided in this discharge summary should you desire treatment.

## 2021-07-13 ENCOUNTER — Encounter (HOSPITAL_COMMUNITY): Payer: Self-pay | Admitting: Emergency Medicine

## 2021-07-13 ENCOUNTER — Emergency Department (HOSPITAL_COMMUNITY)
Admission: EM | Admit: 2021-07-13 | Discharge: 2021-07-14 | Disposition: A | Discharge status: Other Acute Inpatient Hospital | Payer: Medicare Other | Attending: Emergency Medicine | Admitting: Emergency Medicine

## 2021-07-13 DIAGNOSIS — I10 Essential (primary) hypertension: Secondary | ICD-10-CM | POA: Diagnosis not present

## 2021-07-13 DIAGNOSIS — F10920 Alcohol use, unspecified with intoxication, uncomplicated: Secondary | ICD-10-CM

## 2021-07-13 DIAGNOSIS — Z20822 Contact with and (suspected) exposure to covid-19: Secondary | ICD-10-CM | POA: Diagnosis not present

## 2021-07-13 DIAGNOSIS — Z7984 Long term (current) use of oral hypoglycemic drugs: Secondary | ICD-10-CM | POA: Diagnosis not present

## 2021-07-13 DIAGNOSIS — Z794 Long term (current) use of insulin: Secondary | ICD-10-CM | POA: Diagnosis not present

## 2021-07-13 DIAGNOSIS — F10129 Alcohol abuse with intoxication, unspecified: Secondary | ICD-10-CM | POA: Diagnosis not present

## 2021-07-13 DIAGNOSIS — Z79899 Other long term (current) drug therapy: Secondary | ICD-10-CM | POA: Diagnosis not present

## 2021-07-13 DIAGNOSIS — Z046 Encounter for general psychiatric examination, requested by authority: Secondary | ICD-10-CM | POA: Insufficient documentation

## 2021-07-13 DIAGNOSIS — E876 Hypokalemia: Secondary | ICD-10-CM | POA: Insufficient documentation

## 2021-07-13 DIAGNOSIS — F1020 Alcohol dependence, uncomplicated: Secondary | ICD-10-CM | POA: Diagnosis not present

## 2021-07-13 DIAGNOSIS — F3130 Bipolar disorder, current episode depressed, mild or moderate severity, unspecified: Secondary | ICD-10-CM | POA: Insufficient documentation

## 2021-07-13 DIAGNOSIS — Y908 Blood alcohol level of 240 mg/100 ml or more: Secondary | ICD-10-CM | POA: Diagnosis not present

## 2021-07-13 DIAGNOSIS — R45851 Suicidal ideations: Secondary | ICD-10-CM | POA: Insufficient documentation

## 2021-07-13 DIAGNOSIS — E119 Type 2 diabetes mellitus without complications: Secondary | ICD-10-CM | POA: Insufficient documentation

## 2021-07-13 LAB — COMPREHENSIVE METABOLIC PANEL
ALT: 125 U/L — ABNORMAL HIGH (ref 0–44)
AST: 286 U/L — ABNORMAL HIGH (ref 15–41)
Albumin: 3.9 g/dL (ref 3.5–5.0)
Alkaline Phosphatase: 55 U/L (ref 38–126)
Anion gap: >20 — ABNORMAL HIGH (ref 5–15)
BUN: 11 mg/dL (ref 6–20)
CO2: 20 mmol/L — ABNORMAL LOW (ref 22–32)
Calcium: 8.4 mg/dL — ABNORMAL LOW (ref 8.9–10.3)
Chloride: 107 mmol/L (ref 98–111)
Creatinine, Ser: 0.85 mg/dL (ref 0.61–1.24)
GFR, Estimated: >60 mL/min (ref >60)
Glucose, Bld: 141 mg/dL — ABNORMAL HIGH (ref 70–99)
Potassium: 3.2 mmol/L — ABNORMAL LOW (ref 3.5–5.1)
Sodium: 148 mmol/L — ABNORMAL HIGH (ref 135–145)
Total Bilirubin: 1.1 mg/dL (ref 0.3–1.2)
Total Protein: 7 g/dL (ref 6.5–8.1)

## 2021-07-13 LAB — URINALYSIS, ROUTINE W REFLEX MICROSCOPIC
Bacteria, UA: NONE SEEN
Bilirubin Urine: NEGATIVE
Glucose, UA: NEGATIVE mg/dL
Ketones, ur: 20 mg/dL — AB
Leukocytes,Ua: NEGATIVE
Nitrite: NEGATIVE
Protein, ur: 100 mg/dL — AB
Specific Gravity, Urine: 1.013 (ref 1.005–1.030)
pH: 5 (ref 5.0–8.0)

## 2021-07-13 LAB — CBC WITH DIFFERENTIAL/PLATELET
Abs Immature Granulocytes: 0.03 10*3/uL (ref 0.00–0.07)
Basophils Absolute: 0.1 10*3/uL (ref 0.0–0.1)
Basophils Relative: 2 %
Eosinophils Absolute: 0.1 10*3/uL (ref 0.0–0.5)
Eosinophils Relative: 2 %
HCT: 38.8 % — ABNORMAL LOW (ref 39.0–52.0)
Hemoglobin: 13.3 g/dL (ref 13.0–17.0)
Immature Granulocytes: 1 %
Lymphocytes Relative: 26 %
Lymphs Abs: 1.1 10*3/uL (ref 0.7–4.0)
MCH: 28.4 pg (ref 26.0–34.0)
MCHC: 34.3 g/dL (ref 30.0–36.0)
MCV: 82.9 fL (ref 80.0–100.0)
Monocytes Absolute: 0.2 10*3/uL (ref 0.1–1.0)
Monocytes Relative: 5 %
Neutro Abs: 2.7 10*3/uL (ref 1.7–7.7)
Neutrophils Relative %: 64 %
Platelets: 197 10*3/uL (ref 150–400)
RBC: 4.68 MIL/uL (ref 4.22–5.81)
RDW: 13.9 % (ref 11.5–15.5)
WBC: 4.1 10*3/uL (ref 4.0–10.5)
nRBC: 0 % (ref 0.0–0.2)

## 2021-07-13 LAB — RESP PANEL BY RT-PCR (FLU A&B, COVID) ARPGX2
Influenza A by PCR: NEGATIVE
Influenza B by PCR: NEGATIVE
SARS Coronavirus 2 by RT PCR: NEGATIVE

## 2021-07-13 LAB — CBG MONITORING, ED: Glucose-Capillary: 166 mg/dL — ABNORMAL HIGH (ref 70–99)

## 2021-07-13 LAB — RAPID URINE DRUG SCREEN, HOSP PERFORMED
Amphetamines: NOT DETECTED
Barbiturates: NOT DETECTED
Benzodiazepines: POSITIVE — AB
Cocaine: NOT DETECTED
Opiates: NOT DETECTED
Tetrahydrocannabinol: NOT DETECTED

## 2021-07-13 LAB — ACETAMINOPHEN LEVEL: Acetaminophen (Tylenol), Serum: <10 ug/mL — ABNORMAL LOW (ref 10–30)

## 2021-07-13 LAB — SALICYLATE LEVEL: Salicylate Lvl: <7 mg/dL — ABNORMAL LOW (ref 7.0–30.0)

## 2021-07-13 LAB — MAGNESIUM: Magnesium: 2.6 mg/dL — ABNORMAL HIGH (ref 1.7–2.4)

## 2021-07-13 LAB — ETHANOL: Alcohol, Ethyl (B): 265 mg/dL — ABNORMAL HIGH (ref <10)

## 2021-07-13 MED ORDER — THIAMINE HCL 100 MG/ML IJ SOLN: 100.0000 mg | Freq: Every day | INTRAMUSCULAR | Status: DC

## 2021-07-13 MED ORDER — QUETIAPINE FUMARATE 50 MG PO TABS
150.0000 mg | ORAL_TABLET | Freq: Every day | ORAL | Status: DC
  Administered 2021-07-13: 150 mg via ORAL
  Filled 2021-07-13: qty 1

## 2021-07-13 MED ORDER — LORAZEPAM 1 MG PO TABS: 0.0000 mg | ORAL_TABLET | Freq: Two times a day (BID) | ORAL | Status: DC

## 2021-07-13 MED ORDER — FENOFIBRATE 160 MG PO TABS
160.0000 mg | ORAL_TABLET | Freq: Every day | ORAL | Status: DC
  Filled 2021-07-13: qty 1

## 2021-07-13 MED ORDER — THIAMINE HCL 100 MG PO TABS
100.0000 mg | ORAL_TABLET | Freq: Every day | ORAL | Status: DC
  Administered 2021-07-13 – 2021-07-14 (×2): 100 mg via ORAL
  Filled 2021-07-13 (×2): qty 1

## 2021-07-13 MED ORDER — POTASSIUM CHLORIDE CRYS ER 20 MEQ PO TBCR
40.0000 meq | EXTENDED_RELEASE_TABLET | Freq: Once | ORAL | Status: AC
  Administered 2021-07-13: 40 meq via ORAL
  Filled 2021-07-13: qty 2

## 2021-07-13 MED ORDER — PRAZOSIN HCL 1 MG PO CAPS
2.0000 mg | ORAL_CAPSULE | Freq: Every day | ORAL | Status: DC
  Administered 2021-07-13: 2 mg via ORAL
  Filled 2021-07-13: qty 2

## 2021-07-13 MED ORDER — PANTOPRAZOLE SODIUM 40 MG PO TBEC
40.0000 mg | DELAYED_RELEASE_TABLET | Freq: Every day | ORAL | Status: DC
  Administered 2021-07-13: 40 mg via ORAL
  Filled 2021-07-13: qty 1

## 2021-07-13 MED ORDER — OMEPRAZOLE MAGNESIUM 20 MG PO TBEC: 40.0000 mg | DELAYED_RELEASE_TABLET | Freq: Every day | ORAL | Status: DC

## 2021-07-13 MED ORDER — SODIUM CHLORIDE 0.9 % IV BOLUS
1000.0000 mL | Freq: Once | INTRAVENOUS | Status: AC
  Administered 2021-07-13: 1000 mL via INTRAVENOUS

## 2021-07-13 MED ORDER — LORAZEPAM 1 MG PO TABS: 1.0000 mg | ORAL_TABLET | Freq: Once | ORAL | Status: DC

## 2021-07-13 MED ORDER — BUPRENORPHINE HCL 8 MG SL SUBL
8.0000 mg | SUBLINGUAL_TABLET | Freq: Three times a day (TID) | SUBLINGUAL | Status: DC
  Administered 2021-07-13 – 2021-07-14 (×2): 8 mg via SUBLINGUAL
  Filled 2021-07-13 (×2): qty 1

## 2021-07-13 MED ORDER — METFORMIN HCL ER 500 MG PO TB24
1000.0000 mg | ORAL_TABLET | Freq: Two times a day (BID) | ORAL | Status: DC
  Administered 2021-07-14: 1000 mg via ORAL
  Filled 2021-07-13 (×2): qty 2

## 2021-07-13 MED ORDER — LOSARTAN POTASSIUM 25 MG PO TABS
100.0000 mg | ORAL_TABLET | Freq: Every day | ORAL | Status: DC
  Administered 2021-07-13 – 2021-07-14 (×2): 100 mg via ORAL
  Filled 2021-07-13 (×2): qty 4

## 2021-07-13 MED ORDER — LORAZEPAM 2 MG/ML IJ SOLN: 0.0000 mg | Freq: Four times a day (QID) | INTRAMUSCULAR | Status: DC

## 2021-07-13 MED ORDER — ATORVASTATIN CALCIUM 40 MG PO TABS
80.0000 mg | ORAL_TABLET | Freq: Every day | ORAL | Status: DC
  Administered 2021-07-13 – 2021-07-14 (×2): 80 mg via ORAL
  Filled 2021-07-13 (×2): qty 2

## 2021-07-13 MED ORDER — LORAZEPAM 2 MG/ML IJ SOLN: 0.0000 mg | Freq: Two times a day (BID) | INTRAMUSCULAR | Status: DC

## 2021-07-13 MED ORDER — LORAZEPAM 1 MG PO TABS
0.0000 mg | ORAL_TABLET | Freq: Four times a day (QID) | ORAL | Status: DC
  Administered 2021-07-13 – 2021-07-14 (×3): 1 mg via ORAL
  Filled 2021-07-13 (×3): qty 1

## 2021-07-13 MED ORDER — SERTRALINE HCL 50 MG PO TABS
100.0000 mg | ORAL_TABLET | Freq: Every day | ORAL | Status: DC
  Administered 2021-07-13 – 2021-07-14 (×2): 100 mg via ORAL
  Filled 2021-07-13 (×2): qty 2

## 2021-07-13 NOTE — BH Assessment (Signed)
Clinician messaged Rayetta Pigg, RN: "Hey. It's Trey with TTS. Is the pt able to engage in the assessment, if so the pt will need to be placed in a private room. Is the pt under IVC? Also is the pt medically cleared? Fax number: [phone]."  Clinician awaiting response.    Vertell Novak, Joshua, [facility], [facility] Triage Specialist [phone]

## 2021-07-13 NOTE — ED Notes (Signed)
TTS at bedside. 

## 2021-07-13 NOTE — ED Triage Notes (Signed)
Patient BIB [employer] with IVC paperwork. Hx alcohol abuse. Cooperative with [employer].

## 2021-07-13 NOTE — ED Notes (Signed)
Pt ambulatory without assistance.  

## 2021-07-13 NOTE — ED Notes (Signed)
Patient changed into burgundy scrubs and personal belongings (t-shirt, shorts, socks, and sneakers) bagged and placed into cabinet behind 9-25 nurses station. Patient wanded by security.

## 2021-07-13 NOTE — ED Notes (Signed)
Attempt to establish IV x 2 without success.

## 2021-07-13 NOTE — ED Provider Notes (Addendum)
[facility] DEPT Provider Note   CSN: [id number] Arrival date & time: 07/13/21  1505     History  Chief Complaint  Patient presents with   IVC    [name] [name] is a 61 y.o. male.  Pt is a 61 yo male with a pmhx significant for bipolar d/o, DM, anxiety, GERD, HLD, HTN, QT prolongation, and etoh abuse.  Pt has been here several times lately for etoh abuse.  Pt said he has been depressed since his parents died last year.  He estimates that he drinks a fifth per day.  The pt's ex-wife took out IVC papers on him today because he was threatening suicide.  The police tell me that he had a gun to his head threatening to shoot himself.  The police said it was a pellet gun, but it looked real.        Home Medications Prior to Admission medications   Medication Sig Start Date End Date Taking? Authorizing Provider  buprenorphine (SUBUTEX) 8 MG SUBL SL tablet Place 8 mg under the tongue 3 (three) times daily.   Yes [provider]  atorvastatin (LIPITOR) 80 MG tablet Take 1 tablet (80 mg total) by mouth daily. Patient not taking: Reported on 07/13/2021 01/26/21 07/24/21  Camillia Herter, NP  blood glucose meter kit and supplies KIT Dispense based on patient and insurance preference. Use up to four times daily as directed. 07/03/21   Little Ishikawa, MD  chlordiazePOXIDE (LIBRIUM) 25 MG capsule $RemoveBe'50mg'AKSmSNhMq$  PO TID x 1D, then 25-$RemoveBef'50mg'cMzvLBTaEZ$  PO BID X 1D, then 25-$RemoveBef'50mg'wssIagHqHD$  PO QD X 1D Patient not taking: Reported on 07/13/2021 07/10/21   Veryl Speak, MD  Dulaglutide (TRULICITY) 5.10 CH/8.5ID SOPN Inject 0.75 mg into the skin once a week. Patient not taking: Reported on 07/13/2021 01/25/21   Camillia Herter, NP  fenofibrate (TRICOR) 145 MG tablet Take 1 tablet (145 mg total) by mouth daily. Patient not taking: Reported on 07/13/2021 06/19/20   Nicolette Bang, MD  furosemide (LASIX) 20 MG tablet Take 1 tablet (20 mg total) by mouth daily for 7 days. Patient not taking:  Reported on 07/13/2021 01/25/21 07/13/21  Camillia Herter, NP  hydrochlorothiazide (HYDRODIURIL) 25 MG tablet TAKE 1 TABLET(25 MG) BY MOUTH DAILY Patient not taking: Reported on 07/13/2021 01/25/21   Camillia Herter, NP  hydrochlorothiazide (HYDRODIURIL) 50 MG tablet Take 1 tablet (50 mg total) by mouth daily. Patient not taking: Reported on 07/13/2021 07/04/21   Little Ishikawa, MD  insulin glargine (LANTUS) 100 UNIT/ML Solostar Pen Inject 15 Units into the skin at bedtime. Patient not taking: Reported on 07/13/2021 07/03/21   Little Ishikawa, MD  Insulin Pen Needle (PEN NEEDLES) 32G X 4 MM MISC 1 each by Does not apply route 3 (three) times daily before meals. 07/03/21 08/02/21  Little Ishikawa, MD  losartan (COZAAR) 100 MG tablet TAKE 1 TABLET(100 MG) BY MOUTH DAILY Patient not taking: Reported on 07/13/2021 01/25/21   Camillia Herter, NP  metFORMIN (GLUCOPHAGE-XR) 500 MG 24 hr tablet Take 2 tablets (1,000 mg total) by mouth 2 (two) times daily. Patient not taking: Reported on 07/13/2021 01/25/21 07/24/21  Camillia Herter, NP  omeprazole (PRILOSEC OTC) 20 MG tablet Take 40 mg by mouth daily. Patient not taking: Reported on 07/13/2021    [provider]  prazosin (MINIPRESS) 2 MG capsule Take 2 mg by mouth at bedtime. Patient not taking: Reported on 07/13/2021    [provider]  QUEtiapine (SEROQUEL) 300 MG tablet Take 150 mg by mouth at bedtime. Patient not taking: Reported on 07/13/2021    [provider]  sertraline (ZOLOFT) 100 MG tablet Take 100 mg by mouth daily. Patient not taking: Reported on 07/13/2021 04/09/20   [provider]      Allergies    Heparin, Oxytetracycline, Del-mycin [erythromycin], and Sulfa antibiotics    Review of Systems   Review of Systems  Psychiatric/Behavioral:  Positive for suicidal ideas.   All other systems reviewed and are negative.   Physical Exam Updated Vital Signs BP (!) 186/96   Pulse 75   Temp 99 F (37.2 C) (Oral)    Resp 18   SpO2 95%  Physical Exam Vitals and nursing note reviewed.  Constitutional:      Appearance: Normal appearance.  HENT:     Head: Normocephalic and atraumatic.     Right Ear: External ear normal.     Left Ear: External ear normal.     Nose: Nose normal.     Mouth/Throat:     Mouth: Mucous membranes are dry.  Eyes:     Extraocular Movements: Extraocular movements intact.     Conjunctiva/sclera: Conjunctivae normal.     Pupils: Pupils are equal, round, and reactive to light.  Cardiovascular:     Rate and Rhythm: Normal rate and regular rhythm.     Pulses: Normal pulses.     Heart sounds: Normal heart sounds.  Pulmonary:     Effort: Pulmonary effort is normal.     Breath sounds: Normal breath sounds.  Abdominal:     General: Abdomen is flat. Bowel sounds are normal.     Palpations: Abdomen is soft.  Musculoskeletal:        General: Normal range of motion.     Cervical back: Normal range of motion and neck supple.  Skin:    General: Skin is warm.     Capillary Refill: Capillary refill takes less than 2 seconds.  Neurological:     General: No focal deficit present.     Mental Status: He is alert and oriented to person, place, and time.  Psychiatric:        Thought Content: Thought content includes suicidal ideation.     ED Results / Procedures / Treatments   Labs (all labs ordered are listed, but only abnormal results are displayed) Labs Reviewed  COMPREHENSIVE METABOLIC PANEL - Abnormal; Notable for the following components:      Result Value   Sodium 148 (*)    Potassium 3.2 (*)    CO2 20 (*)    Glucose, Bld 141 (*)    Calcium 8.4 (*)    AST 286 (*)    ALT 125 (*)    Anion gap >20 (*)    All other components within normal limits  ETHANOL - Abnormal; Notable for the following components:   Alcohol, Ethyl (B) 265 (*)    All other components within normal limits  RAPID URINE DRUG SCREEN, HOSP PERFORMED - Abnormal; Notable for the following components:    Benzodiazepines POSITIVE (*)    All other components within normal limits  CBC WITH DIFFERENTIAL/PLATELET - Abnormal; Notable for the following components:   HCT 38.8 (*)    All other components within normal limits  URINALYSIS, ROUTINE W REFLEX MICROSCOPIC - Abnormal; Notable for the following components:   Hgb urine dipstick SMALL (*)    Ketones, ur 20 (*)    Protein, ur 100 (*)  All other components within normal limits  ACETAMINOPHEN LEVEL - Abnormal; Notable for the following components:   Acetaminophen (Tylenol), Serum <10 (*)    All other components within normal limits  SALICYLATE LEVEL - Abnormal; Notable for the following components:   Salicylate Lvl <8.0 (*)    All other components within normal limits  MAGNESIUM - Abnormal; Notable for the following components:   Magnesium 2.6 (*)    All other components within normal limits  RESP PANEL BY RT-PCR (FLU A&B, COVID) [id number]  CBG MONITORING, ED    EKG EKG Interpretation  Date/Time:  Saturday July 13 2021 15:54:53 EDT Ventricular Rate:  72 PR Interval:  178 QRS Duration: 96 QT Interval:  456 QTC Calculation: 499 R Axis:   2 Text Interpretation: Normal sinus rhythm Prolonged QT Abnormal ECG When compared with ECG of 30-Jun-2021 13:19, PREVIOUS ECG IS PRESENT No significant change since last tracing Confirmed by Isla Pence [phone]) on 07/13/2021 4:11:10 PM  Radiology No results found.  Procedures Procedures    Medications Ordered in ED Medications  LORazepam (ATIVAN) injection 0-4 mg ( Intravenous See Alternative 07/13/21 1920)    Or  LORazepam (ATIVAN) tablet 0-4 mg (1 mg Oral Given 07/13/21 1920)  LORazepam (ATIVAN) injection 0-4 mg (has no administration in time range)    Or  LORazepam (ATIVAN) tablet 0-4 mg (has no administration in time range)  thiamine tablet 100 mg (100 mg Oral Given 07/13/21 1919)    Or  thiamine (B-1) injection 100 mg ( Intravenous See Alternative 07/13/21 1919)  LORazepam (ATIVAN) tablet  1 mg (1 mg Oral Not Given 07/13/21 1923)  buprenorphine (SUBUTEX) sublingual tablet 8 mg (has no administration in time range)  sertraline (ZOLOFT) tablet 100 mg (has no administration in time range)  QUEtiapine (SEROQUEL) tablet 150 mg (has no administration in time range)  prazosin (MINIPRESS) capsule 2 mg (has no administration in time range)  omeprazole (PRILOSEC OTC) EC tablet 40 mg (has no administration in time range)  metFORMIN (GLUCOPHAGE-XR) 24 hr tablet 1,000 mg (has no administration in time range)  losartan (COZAAR) tablet 100 mg (has no administration in time range)  fenofibrate tablet 160 mg (has no administration in time range)  atorvastatin (LIPITOR) tablet 80 mg (has no administration in time range)  sodium chloride 0.9 % bolus 1,000 mL (0 mLs Intravenous Stopped 07/13/21 2011)  potassium chloride SA (KLOR-CON M) CR tablet 40 mEq (40 mEq Oral Given 07/13/21 1919)    ED Course/ Medical Decision Making/ A&P                           Medical Decision Making Amount and/or Complexity of Data Reviewed Labs: ordered.  Risk OTC drugs. Prescription drug management.   This patient presents to the ED for concern of SI, this involves an extensive number of treatment options, and is a complaint that carries with it a high risk of complications and morbidity.  The differential diagnosis includes suicidal   Co morbidities that complicate the patient evaluation  bipolar d/o, DM, anxiety, GERD, HLD, HTN, QT prolongation, and etoh abuse   Additional history obtained:  Additional history obtained from epic chart review External records from outside source obtained and reviewed including police   Lab Tests:  I Ordered, and personally interpreted labs.  The pertinent results include:  UDS + bzd, Etoh elevated at 265; cbc nl; UA + ketones; acet <10, Sal <10; CMP with K 3.2 and AST 286  and ALT 125 (slightly worse than nl, but hx of elevated LFTs from etoh abuse)   Cardiac  Monitoring:  The patient was maintained on a cardiac monitor.  I personally viewed and interpreted the cardiac monitored which showed an underlying rhythm of: nsr   Medicines ordered and prescription drug management:  I ordered medication including kdur  for hypokalemia  Reevaluation of the patient after these medicines showed that the patient improved I have reviewed the patients home medicines and have made adjustments as needed   Consultations Obtained:  I requested consultation with the tts,  and discussed lab and imaging findings as well as pertinent plan - consult pending  Problem List / ED Course:  Alcohol abuse:  CIWA protocol ordered.  No s/s of etoh w/dr now. Hypokalemia: 40 meq Kdur given to pt.  Mg ordered and it is normal. SI:  pt is medically clear for TTS consult Medication noncompliance:  I have reordered all meds that pt is supposed to be taking except for the insulin and diuretics.  BS is 141.  Metformin ordered and cbg q shift is ordered.  If bs starts to creep up, insulin can be added.  Pt has no signs of fluid overload which is why the diuretics have been held.  If needed, they can be added on.   Reevaluation:  After the interventions noted above, I reevaluated the patient and found that they have :improved   Social Determinants of Health:  Lives alone   Dispostion:  Per TTS         Final Clinical Impression(s) / ED Diagnoses Final diagnoses:  Alcoholic intoxication without complication ([place])  Suicidal ideation  Hypokalemia    Rx / DC Orders ED Discharge Orders     None         Isla Pence, MD 07/13/21 2112    Isla Pence, MD 07/13/21 2116    Isla Pence, MD 07/13/21 2117

## 2021-07-13 NOTE — ED Notes (Signed)
Pt very polite with staff, calm and cooperative.

## 2021-07-14 ENCOUNTER — Encounter (HOSPITAL_COMMUNITY): Payer: Self-pay

## 2021-07-14 ENCOUNTER — Other Ambulatory Visit: Payer: Self-pay

## 2021-07-14 LAB — CBG MONITORING, ED
Glucose-Capillary: 244 mg/dL — ABNORMAL HIGH (ref 70–99)
Glucose-Capillary: 223 mg/dL — ABNORMAL HIGH (ref 70–99)

## 2021-07-14 MED ORDER — INSULIN ASPART 100 UNIT/ML IJ SOLN
0.0000 [IU] | Freq: Three times a day (TID) | INTRAMUSCULAR | Status: DC
  Administered 2021-07-14: 5 [IU] via SUBCUTANEOUS
  Filled 2021-07-14: qty 0.15

## 2021-07-14 NOTE — ED Notes (Signed)
Assumed care of pt. AAOX4. Pt in no apparent distress or pain. 1:1 sitter at the bedside.

## 2021-07-14 NOTE — Progress Notes (Signed)
Robbi with [facility] contacted CSW in reference to review this patient for placement. Robbi requesting a call back for further review, phone#: [phone].   Glennie Isle, MSW, [facility] Phone: [phone] Disposition/TOC
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
## 2021-07-14 NOTE — ED Notes (Signed)
Pt provided sandwich and apple juice by NT/Sitter.

## 2021-07-14 NOTE — Progress Notes (Signed)
Per Leandro Reasoner, NP, patient meets criteria for inpatient treatment. There are no available beds at [facility] today. CSW faxed referrals to the following facilities for review:  [address] [place] [address] [phone] --  [facility] [address] [place] [address] [phone] --  [facility]  Pending - Request Sent N/A [address] [place] [address] [phone] --  [address] [place] [address] [phone] --  [address] [place] [address] [phone] --  [place]  Pending - Request Sent N/A [place], [place] [address] [phone] --  [place] [address] [place], [place] [address] [phone] --  [place] [address] [place] [address] [phone] --  [facility]  Pending - Request Sent N/A [address], [place] [address] [place] [address] [place] [address] [phone] --  [facility]  Pending - Request Sent N/A [address] [place] [address] [phone] --  [facility]  Pending - Request Sent N/A [address], [place] [address] [phone] --  [facility]  Pending - Request Sent N/A [place], [place] [address] [phone] --  [facility]  Pending - Request Sent N/A [address], [place] [address] [phone] --  [facility]  Pending - Request Sent N/A [address] [place] [address] [phone] --  [facility]  Pending - Request Sent N/A [address] [place] [address] [phone] --   TTS will continue to seek bed placement.  [place], MSW, [place] Phone: [phone] Disposition/TOC

## 2021-07-14 NOTE — ED Provider Notes (Signed)
Emergency Medicine Observation Re-evaluation Note  [name] [name] is a 61 y.o. male, seen on rounds today.  Pt initially presented to the ED for complaints of IVC Currently, the patient is resting comfortably.  Physical Exam  BP 106/74 (BP Location: Left Arm)   Pulse 89   Temp 97.9 F (36.6 C) (Oral)   Resp (!) 24   Ht '6\' 1"'$  (1.854 m)   Wt 106 kg   SpO2 97%   BMI 30.83 kg/m  Physical Exam General: Asleep initially but no distress Cardiac: No murmur Lungs: Clear Psych: No agitation  ED Course / MDM  EKG:EKG Interpretation  Date/Time:  Saturday July 13 2021 15:54:53 EDT Ventricular Rate:  72 PR Interval:  178 QRS Duration: 96 QT Interval:  456 QTC Calculation: 499 R Axis:   2 Text Interpretation: Normal sinus rhythm Prolonged QT Abnormal ECG When compared with ECG of 30-Jun-2021 13:19, PREVIOUS ECG IS PRESENT No significant change since last tracing Confirmed by Isla Pence [phone]) on 07/13/2021 4:11:10 PM  I have reviewed the labs performed to date as well as medications administered while in observation.  Recent changes in the last 24 hours include none reported by nursing.  Plan  Current plan is for awaiting placement. [name] [name] is under involuntary commitment.      Eryc Bodey, Gwenyth Allegra, MD 07/14/21 1019

## 2021-07-14 NOTE — Progress Notes (Signed)
Per [name] [name] Admissions, pt has been accepted to [facility]. Accepting provider is Dr. Merrie Roof. Patient can arrive anytime. Number for report is [phone].   [name] [name], MSW, LCSW-A Phone: [phone] Disposition/TOC

## 2021-07-14 NOTE — ED Notes (Signed)
Left v/m at [phone] for sheriff regarding pt transport to [facility].

## 2021-07-14 NOTE — ED Notes (Signed)
Pt continues to be polite and calm with staff, no aggressive behavior.

## 2021-07-17 ENCOUNTER — Encounter: Payer: Medicare Other | Admitting: Family

## 2021-07-17 DIAGNOSIS — R748 Abnormal levels of other serum enzymes: Secondary | ICD-10-CM

## 2021-07-17 DIAGNOSIS — I10 Essential (primary) hypertension: Secondary | ICD-10-CM

## 2021-07-17 DIAGNOSIS — Z09 Encounter for follow-up examination after completed treatment for conditions other than malignant neoplasm: Secondary | ICD-10-CM

## 2021-07-17 DIAGNOSIS — F10939 Alcohol use, unspecified with withdrawal, unspecified: Secondary | ICD-10-CM

## 2021-07-17 DIAGNOSIS — E8729 Other acidosis: Secondary | ICD-10-CM

## 2021-07-17 DIAGNOSIS — E1165 Type 2 diabetes mellitus with hyperglycemia: Secondary | ICD-10-CM

## 2021-08-01 ENCOUNTER — Emergency Department (HOSPITAL_COMMUNITY): Payer: Medicare Other

## 2021-08-01 ENCOUNTER — Encounter (HOSPITAL_COMMUNITY): Payer: Self-pay

## 2021-08-01 ENCOUNTER — Inpatient Hospital Stay (HOSPITAL_COMMUNITY)
Admission: EM | Admit: 2021-08-01 | Discharge: 2021-08-08 | DRG: 896 | Disposition: A | Discharge status: Other Acute Inpatient Hospital | Payer: Medicare Other | Attending: Internal Medicine | Admitting: Internal Medicine

## 2021-08-01 DIAGNOSIS — K760 Fatty (change of) liver, not elsewhere classified: Secondary | ICD-10-CM | POA: Diagnosis present

## 2021-08-01 DIAGNOSIS — M1990 Unspecified osteoarthritis, unspecified site: Secondary | ICD-10-CM | POA: Diagnosis present

## 2021-08-01 DIAGNOSIS — T50901A Poisoning by unspecified drugs, medicaments and biological substances, accidental (unintentional), initial encounter: Secondary | ICD-10-CM | POA: Diagnosis present

## 2021-08-01 DIAGNOSIS — G9341 Metabolic encephalopathy: Secondary | ICD-10-CM | POA: Diagnosis not present

## 2021-08-01 DIAGNOSIS — F411 Generalized anxiety disorder: Secondary | ICD-10-CM | POA: Diagnosis present

## 2021-08-01 DIAGNOSIS — E7800 Pure hypercholesterolemia, unspecified: Secondary | ICD-10-CM | POA: Diagnosis present

## 2021-08-01 DIAGNOSIS — I10 Essential (primary) hypertension: Secondary | ICD-10-CM | POA: Diagnosis present

## 2021-08-01 DIAGNOSIS — Z6830 Body mass index (BMI) 30.0-30.9, adult: Secondary | ICD-10-CM | POA: Diagnosis not present

## 2021-08-01 DIAGNOSIS — E669 Obesity, unspecified: Secondary | ICD-10-CM | POA: Diagnosis present

## 2021-08-01 DIAGNOSIS — F10929 Alcohol use, unspecified with intoxication, unspecified: Principal | ICD-10-CM

## 2021-08-01 DIAGNOSIS — R0781 Pleurodynia: Secondary | ICD-10-CM | POA: Diagnosis not present

## 2021-08-01 DIAGNOSIS — Z882 Allergy status to sulfonamides status: Secondary | ICD-10-CM

## 2021-08-01 DIAGNOSIS — W010XXA Fall on same level from slipping, tripping and stumbling without subsequent striking against object, initial encounter: Secondary | ICD-10-CM | POA: Diagnosis present

## 2021-08-01 DIAGNOSIS — E8809 Other disorders of plasma-protein metabolism, not elsewhere classified: Secondary | ICD-10-CM | POA: Diagnosis not present

## 2021-08-01 DIAGNOSIS — Y908 Blood alcohol level of 240 mg/100 ml or more: Secondary | ICD-10-CM | POA: Diagnosis present

## 2021-08-01 DIAGNOSIS — R197 Diarrhea, unspecified: Secondary | ICD-10-CM | POA: Diagnosis present

## 2021-08-01 DIAGNOSIS — S20211A Contusion of right front wall of thorax, initial encounter: Secondary | ICD-10-CM | POA: Diagnosis not present

## 2021-08-01 DIAGNOSIS — D61818 Other pancytopenia: Secondary | ICD-10-CM | POA: Diagnosis not present

## 2021-08-01 DIAGNOSIS — E1110 Type 2 diabetes mellitus with ketoacidosis without coma: Secondary | ICD-10-CM | POA: Diagnosis present

## 2021-08-01 DIAGNOSIS — Z8249 Family history of ischemic heart disease and other diseases of the circulatory system: Secondary | ICD-10-CM

## 2021-08-01 DIAGNOSIS — Z8619 Personal history of other infectious and parasitic diseases: Secondary | ICD-10-CM

## 2021-08-01 DIAGNOSIS — Z7984 Long term (current) use of oral hypoglycemic drugs: Secondary | ICD-10-CM

## 2021-08-01 DIAGNOSIS — F3181 Bipolar II disorder: Secondary | ICD-10-CM

## 2021-08-01 DIAGNOSIS — R110 Nausea: Secondary | ICD-10-CM | POA: Diagnosis not present

## 2021-08-01 DIAGNOSIS — Z888 Allergy status to other drugs, medicaments and biological substances status: Secondary | ICD-10-CM

## 2021-08-01 DIAGNOSIS — E876 Hypokalemia: Secondary | ICD-10-CM | POA: Diagnosis present

## 2021-08-01 DIAGNOSIS — K6389 Other specified diseases of intestine: Secondary | ICD-10-CM | POA: Diagnosis not present

## 2021-08-01 DIAGNOSIS — K7010 Alcoholic hepatitis without ascites: Secondary | ICD-10-CM | POA: Diagnosis present

## 2021-08-01 DIAGNOSIS — Z8674 Personal history of sudden cardiac arrest: Secondary | ICD-10-CM

## 2021-08-01 DIAGNOSIS — Z91148 Patient's other noncompliance with medication regimen for other reason: Secondary | ICD-10-CM

## 2021-08-01 DIAGNOSIS — Z7985 Long-term (current) use of injectable non-insulin antidiabetic drugs: Secondary | ICD-10-CM

## 2021-08-01 DIAGNOSIS — K3184 Gastroparesis: Secondary | ICD-10-CM | POA: Diagnosis present

## 2021-08-01 DIAGNOSIS — Z79899 Other long term (current) drug therapy: Secondary | ICD-10-CM

## 2021-08-01 DIAGNOSIS — E1143 Type 2 diabetes mellitus with diabetic autonomic (poly)neuropathy: Secondary | ICD-10-CM | POA: Diagnosis not present

## 2021-08-01 DIAGNOSIS — R059 Cough, unspecified: Secondary | ICD-10-CM | POA: Diagnosis present

## 2021-08-01 DIAGNOSIS — R7401 Elevation of levels of liver transaminase levels: Secondary | ICD-10-CM | POA: Diagnosis present

## 2021-08-01 DIAGNOSIS — W19XXXA Unspecified fall, initial encounter: Secondary | ICD-10-CM

## 2021-08-01 DIAGNOSIS — E1142 Type 2 diabetes mellitus with diabetic polyneuropathy: Secondary | ICD-10-CM | POA: Diagnosis present

## 2021-08-01 DIAGNOSIS — N179 Acute kidney failure, unspecified: Secondary | ICD-10-CM | POA: Diagnosis not present

## 2021-08-01 DIAGNOSIS — Z794 Long term (current) use of insulin: Secondary | ICD-10-CM

## 2021-08-01 DIAGNOSIS — F10939 Alcohol use, unspecified with withdrawal, unspecified: Secondary | ICD-10-CM | POA: Diagnosis present

## 2021-08-01 DIAGNOSIS — F10239 Alcohol dependence with withdrawal, unspecified: Secondary | ICD-10-CM | POA: Diagnosis present

## 2021-08-01 DIAGNOSIS — Z881 Allergy status to other antibiotic agents status: Secondary | ICD-10-CM

## 2021-08-01 DIAGNOSIS — R739 Hyperglycemia, unspecified: Secondary | ICD-10-CM | POA: Diagnosis not present

## 2021-08-01 DIAGNOSIS — M25551 Pain in right hip: Secondary | ICD-10-CM | POA: Diagnosis not present

## 2021-08-01 DIAGNOSIS — K219 Gastro-esophageal reflux disease without esophagitis: Secondary | ICD-10-CM | POA: Diagnosis present

## 2021-08-01 DIAGNOSIS — F1019 Alcohol abuse with unspecified alcohol-induced disorder: Secondary | ICD-10-CM | POA: Diagnosis not present

## 2021-08-01 DIAGNOSIS — F10229 Alcohol dependence with intoxication, unspecified: Secondary | ICD-10-CM | POA: Diagnosis present

## 2021-08-01 DIAGNOSIS — E872 Acidosis, unspecified: Secondary | ICD-10-CM | POA: Diagnosis present

## 2021-08-01 DIAGNOSIS — G8929 Other chronic pain: Secondary | ICD-10-CM | POA: Diagnosis present

## 2021-08-01 DIAGNOSIS — R7989 Other specified abnormal findings of blood chemistry: Secondary | ICD-10-CM | POA: Diagnosis present

## 2021-08-01 DIAGNOSIS — Z833 Family history of diabetes mellitus: Secondary | ICD-10-CM

## 2021-08-01 DIAGNOSIS — G4730 Sleep apnea, unspecified: Secondary | ICD-10-CM | POA: Diagnosis present

## 2021-08-01 DIAGNOSIS — M79661 Pain in right lower leg: Secondary | ICD-10-CM | POA: Diagnosis not present

## 2021-08-01 DIAGNOSIS — Z8679 Personal history of other diseases of the circulatory system: Secondary | ICD-10-CM | POA: Diagnosis not present

## 2021-08-01 DIAGNOSIS — Z83438 Family history of other disorder of lipoprotein metabolism and other lipidemia: Secondary | ICD-10-CM

## 2021-08-01 DIAGNOSIS — F10930 Alcohol use, unspecified with withdrawal, uncomplicated: Secondary | ICD-10-CM | POA: Diagnosis not present

## 2021-08-01 LAB — COMPREHENSIVE METABOLIC PANEL
ALT: 126 U/L — ABNORMAL HIGH (ref 0–44)
AST: 230 U/L — ABNORMAL HIGH (ref 15–41)
Albumin: 4.3 g/dL (ref 3.5–5.0)
Alkaline Phosphatase: 68 U/L (ref 38–126)
Anion gap: 29 — ABNORMAL HIGH (ref 5–15)
BUN: 13 mg/dL (ref 6–20)
CO2: 15 mmol/L — ABNORMAL LOW (ref 22–32)
Calcium: 8.3 mg/dL — ABNORMAL LOW (ref 8.9–10.3)
Chloride: 94 mmol/L — ABNORMAL LOW (ref 98–111)
Creatinine, Ser: 1.19 mg/dL (ref 0.61–1.24)
GFR, Estimated: >60 mL/min (ref >60)
Glucose, Bld: 521 mg/dL (ref 70–99)
Potassium: 3 mmol/L — ABNORMAL LOW (ref 3.5–5.1)
Sodium: 138 mmol/L (ref 135–145)
Total Bilirubin: 1.8 mg/dL — ABNORMAL HIGH (ref 0.3–1.2)
Total Protein: 7.5 g/dL (ref 6.5–8.1)

## 2021-08-01 LAB — CBC WITH DIFFERENTIAL/PLATELET
Abs Immature Granulocytes: 0.09 10*3/uL — ABNORMAL HIGH (ref 0.00–0.07)
Basophils Absolute: 0.1 10*3/uL (ref 0.0–0.1)
Basophils Relative: 2 %
Eosinophils Absolute: 0 10*3/uL (ref 0.0–0.5)
Eosinophils Relative: 0 %
HCT: 41.8 % (ref 39.0–52.0)
Hemoglobin: 14.8 g/dL (ref 13.0–17.0)
Immature Granulocytes: 2 %
Lymphocytes Relative: 16 %
Lymphs Abs: 1 10*3/uL (ref 0.7–4.0)
MCH: 28.5 pg (ref 26.0–34.0)
MCHC: 35.4 g/dL (ref 30.0–36.0)
MCV: 80.5 fL (ref 80.0–100.0)
Monocytes Absolute: 0.5 10*3/uL (ref 0.1–1.0)
Monocytes Relative: 8 %
Neutro Abs: 4.5 10*3/uL (ref 1.7–7.7)
Neutrophils Relative %: 72 %
Platelets: 137 10*3/uL — ABNORMAL LOW (ref 150–400)
RBC: 5.19 MIL/uL (ref 4.22–5.81)
RDW: 13.6 % (ref 11.5–15.5)
WBC: 6.2 10*3/uL (ref 4.0–10.5)
nRBC: 0 % (ref 0.0–0.2)

## 2021-08-01 LAB — URINALYSIS, ROUTINE W REFLEX MICROSCOPIC
Bacteria, UA: NONE SEEN
Bilirubin Urine: NEGATIVE
Glucose, UA: >=500 mg/dL — AB
Ketones, ur: 20 mg/dL — AB
Leukocytes,Ua: NEGATIVE
Nitrite: NEGATIVE
Protein, ur: 100 mg/dL — AB
Specific Gravity, Urine: 1.028 (ref 1.005–1.030)
pH: 6 (ref 5.0–8.0)

## 2021-08-01 LAB — CBG MONITORING, ED
Glucose-Capillary: 448 mg/dL — ABNORMAL HIGH (ref 70–99)
Glucose-Capillary: 569 mg/dL (ref 70–99)

## 2021-08-01 LAB — BLOOD GAS, VENOUS
Acid-base deficit: 5.6 mmol/L — ABNORMAL HIGH (ref 0.0–2.0)
Bicarbonate: 18.2 mmol/L — ABNORMAL LOW (ref 20.0–28.0)
O2 Saturation: 92.3 %
Patient temperature: 37
pCO2, Ven: 30 mmHg — ABNORMAL LOW (ref 44–60)
pH, Ven: 7.39 (ref 7.25–7.43)
pO2, Ven: 65 mmHg — ABNORMAL HIGH (ref 32–45)

## 2021-08-01 LAB — LACTIC ACID, PLASMA: Lactic Acid, Venous: >9 mmol/L (ref 0.5–1.9)

## 2021-08-01 LAB — BETA-HYDROXYBUTYRIC ACID: Beta-Hydroxybutyric Acid: 1.76 mmol/L — ABNORMAL HIGH (ref 0.05–0.27)

## 2021-08-01 LAB — ETHANOL: Alcohol, Ethyl (B): 249 mg/dL — ABNORMAL HIGH (ref <10)

## 2021-08-01 MED ORDER — METOCLOPRAMIDE HCL 5 MG/ML IJ SOLN
10.0000 mg | Freq: Once | INTRAMUSCULAR | Status: AC
  Administered 2021-08-01: 10 mg via INTRAVENOUS
  Filled 2021-08-01: qty 2

## 2021-08-01 MED ORDER — LACTATED RINGERS IV BOLUS
1000.0000 mL | Freq: Once | INTRAVENOUS | Status: AC
  Administered 2021-08-01: 1000 mL via INTRAVENOUS

## 2021-08-01 MED ORDER — SODIUM CHLORIDE 0.9 % IV BOLUS
1000.0000 mL | Freq: Once | INTRAVENOUS | Status: AC
  Administered 2021-08-01: 1000 mL via INTRAVENOUS

## 2021-08-01 MED ORDER — LACTATED RINGERS IV SOLN
INTRAVENOUS | Status: DC
Start: 2021-08-01 — End: 2021-08-03

## 2021-08-01 MED ORDER — POTASSIUM CHLORIDE 10 MEQ/100ML IV SOLN
10.0000 meq | INTRAVENOUS | Status: AC
  Administered 2021-08-01 – 2021-08-02 (×2): 10 meq via INTRAVENOUS
  Filled 2021-08-01 (×3): qty 100

## 2021-08-01 MED ORDER — FOLIC ACID 1 MG PO TABS
1.0000 mg | ORAL_TABLET | Freq: Once | ORAL | Status: AC
  Administered 2021-08-01: 1 mg via ORAL
  Filled 2021-08-01: qty 1

## 2021-08-01 MED ORDER — LORAZEPAM 1 MG PO TABS
1.0000 mg | ORAL_TABLET | ORAL | Status: AC | PRN
  Administered 2021-08-03 (×4): 3 mg via ORAL
  Administered 2021-08-04 (×2): 2 mg via ORAL
  Administered 2021-08-04: 3 mg via ORAL
  Administered 2021-08-04: 2 mg via ORAL
  Administered 2021-08-04: 3 mg via ORAL
  Administered 2021-08-04: 1 mg via ORAL
  Filled 2021-08-01: qty 3
  Filled 2021-08-01: qty 2
  Filled 2021-08-01 (×2): qty 3
  Filled 2021-08-01: qty 2
  Filled 2021-08-01: qty 3
  Filled 2021-08-01: qty 1
  Filled 2021-08-01: qty 3
  Filled 2021-08-01: qty 2
  Filled 2021-08-01: qty 3

## 2021-08-01 MED ORDER — DEXTROSE IN LACTATED RINGERS 5 % IV SOLN
INTRAVENOUS | Status: DC
Start: 2021-08-01 — End: 2021-08-02
  Administered 2021-08-01: 1000 mL via INTRAVENOUS

## 2021-08-01 MED ORDER — INSULIN REGULAR(HUMAN) IN NACL 100-0.9 UT/100ML-% IV SOLN
INTRAVENOUS | Status: DC
  Administered 2021-08-01: 15 [IU]/h via INTRAVENOUS
  Filled 2021-08-01 (×2): qty 100

## 2021-08-01 MED ORDER — ADULT MULTIVITAMIN W/MINERALS CH
1.0000 | ORAL_TABLET | Freq: Every day | ORAL | Status: DC
Start: 2021-08-02 — End: 2021-08-08
  Administered 2021-08-02 – 2021-08-08 (×7): 1 via ORAL
  Filled 2021-08-01 (×7): qty 1

## 2021-08-01 MED ORDER — DEXTROSE 50 % IV SOLN: 0.0000 mL | INTRAVENOUS | Status: DC | PRN

## 2021-08-01 MED ORDER — THIAMINE HCL 100 MG PO TABS
100.0000 mg | ORAL_TABLET | Freq: Every day | ORAL | Status: DC
  Administered 2021-08-02 – 2021-08-08 (×7): 100 mg via ORAL
  Filled 2021-08-01 (×7): qty 1

## 2021-08-01 MED ORDER — LORAZEPAM 2 MG/ML IJ SOLN
1.0000 mg | INTRAMUSCULAR | Status: AC | PRN
  Administered 2021-08-02: 2 mg via INTRAVENOUS
  Administered 2021-08-02: 4 mg via INTRAVENOUS
  Administered 2021-08-02: 3 mg via INTRAVENOUS
  Administered 2021-08-02: 1 mg via INTRAVENOUS
  Administered 2021-08-02: 4 mg via INTRAVENOUS
  Administered 2021-08-03: 2 mg via INTRAVENOUS
  Administered 2021-08-03 (×2): 3 mg via INTRAVENOUS
  Administered 2021-08-03 – 2021-08-04 (×4): 2 mg via INTRAVENOUS
  Filled 2021-08-01 (×2): qty 1
  Filled 2021-08-01 (×2): qty 2
  Filled 2021-08-01 (×4): qty 1
  Filled 2021-08-01 (×2): qty 2
  Filled 2021-08-01 (×2): qty 1
  Filled 2021-08-01: qty 2
  Filled 2021-08-01: qty 1

## 2021-08-01 NOTE — ED Provider Notes (Signed)
[facility] DEPT Provider Note   CSN: [id number] Arrival date & time: 08/01/21  2116     History  Chief Complaint  Patient presents with   Fall   Alcohol Problem    [name] [name] is a 61 y.o. male.  Patient is a 61 year old male with a history of diabetes, chronic alcohol abuse, hypertension, bipolar disease, chronic pain who is presenting today by EMS for elevated blood sugar, intoxication and a fall.  Patient reports he drinks significant amounts of alcohol daily.  Wife reports he has been binging recently and has not been taking his medication.  Patient reports today while intoxicated he fell after he tripped landing on his right hip and right ribs.  He is having pain in the ribs and hip and was concerned he may have broke something.  He reports he is having hard time putting weight on his leg because it is painful.  He denies any shortness of breath but complains of being extremely thirsty with polyuria and polydipsia.  He denies any vomiting but does complain of some abdominal discomfort.  He cannot recall the last time he used his insulin.  He denies fever, dysuria, diarrhea.  He denies hitting his head or loss of consciousness.  The history is provided by the patient.  Fall  Alcohol Problem       Home Medications Prior to Admission medications   Medication Sig Start Date End Date Taking? Authorizing Provider  atorvastatin (LIPITOR) 80 MG tablet Take 1 tablet (80 mg total) by mouth daily. Patient not taking: Reported on 07/13/2021 01/26/21 07/24/21  Camillia Herter, NP  blood glucose meter kit and supplies KIT Dispense based on patient and insurance preference. Use up to four times daily as directed. 07/03/21   Little Ishikawa, MD  buprenorphine (SUBUTEX) 8 MG SUBL SL tablet Place 8 mg under the tongue 3 (three) times daily. Patient not taking: Reported on 08/01/2021    [provider]  chlordiazePOXIDE (LIBRIUM) 25 MG capsule 89m  PO TID x 1D, then 25-519mPO BID X 1D, then 25-5087mO QD X 1D Patient not taking: Reported on 08/01/2021 07/10/21   DelVeryl SpeakD  Dulaglutide (TRULICITY) 0.73.26/ZT/2.4PYPN Inject 0.75 mg into the skin once a week. Patient not taking: Reported on 07/13/2021 01/25/21   SteCamillia HerterP  furosemide (LASIX) 20 MG tablet Take 1 tablet (20 mg total) by mouth daily for 7 days. Patient not taking: Reported on 07/13/2021 01/25/21 07/13/21  SteCamillia HerterP  hydrochlorothiazide (HYDRODIURIL) 50 MG tablet Take 1 tablet (50 mg total) by mouth daily. Patient not taking: Reported on 08/01/2021 07/04/21   LanLittle IshikawaD  insulin glargine (LANTUS) 100 UNIT/ML Solostar Pen Inject 15 Units into the skin at bedtime. Patient not taking: Reported on 07/13/2021 07/03/21   LanLittle IshikawaD  Insulin Pen Needle (PEN NEEDLES) 32G X 4 MM MISC 1 each by Does not apply route 3 (three) times daily before meals. 07/03/21 08/02/21  LanLittle IshikawaD  losartan (COZAAR) 100 MG tablet TAKE 1 TABLET(100 MG) BY MOUTH DAILY Patient not taking: Reported on 07/13/2021 01/25/21   SteCamillia HerterP  metFORMIN (GLUCOPHAGE-XR) 500 MG 24 hr tablet Take 2 tablets (1,000 mg total) by mouth 2 (two) times daily. Patient not taking: Reported on 07/13/2021 01/25/21 07/24/21  SteCamillia HerterP  omeprazole (PRILOSEC OTC) 20 MG tablet Take 40 mg by mouth daily. Patient not taking: Reported on  07/13/2021    [provider]  prazosin (MINIPRESS) 2 MG capsule Take 2 mg by mouth at bedtime. Patient not taking: Reported on 07/13/2021    [provider]  QUEtiapine (SEROQUEL) 300 MG tablet Take 150 mg by mouth at bedtime. Patient not taking: Reported on 07/13/2021    [provider]  sertraline (ZOLOFT) 100 MG tablet Take 100 mg by mouth daily. Patient not taking: Reported on 07/13/2021 04/09/20   [provider]      Allergies    Heparin, Oxytetracycline, Del-mycin [erythromycin], and Sulfa antibiotics     Review of Systems   Review of Systems  Physical Exam Updated Vital Signs BP (!) 174/92   Pulse 93   Temp 98.5 F (36.9 C) (Oral)   Resp (!) 26   Ht 6' 2" (1.88 m)   Wt 108.9 kg   SpO2 (!) 77%   BMI 30.81 kg/m  Physical Exam Vitals and nursing note reviewed.  Constitutional:      General: He is not in acute distress.    Appearance: He is well-developed.     Comments: Patient is mildly disheveled, smells of ketones  HENT:     Head: Normocephalic and atraumatic.     Mouth/Throat:     Mouth: Mucous membranes are dry.  Eyes:     Conjunctiva/sclera: Conjunctivae normal.     Pupils: Pupils are equal, round, and reactive to light.  Cardiovascular:     Rate and Rhythm: Regular rhythm. Tachycardia present.     Pulses: Normal pulses.     Heart sounds: No murmur heard. Pulmonary:     Effort: Pulmonary effort is normal. No respiratory distress.     Breath sounds: Normal breath sounds. No wheezing or rales.  Chest:     Chest wall: Tenderness present.    Abdominal:     General: There is no distension.     Palpations: Abdomen is soft.     Tenderness: There is no abdominal tenderness. There is no guarding or rebound.  Musculoskeletal:        General: No tenderness. Normal range of motion.     Cervical back: Normal range of motion and neck supple.     Right lower leg: Edema present.     Left lower leg: Edema present.     Comments: Trace edema in the bilateral lower extremities with skin changes consistent with chronic venous stasis.  No localized tenderness in bilateral knees or ankles.  Patient does have pain in the right hip with range of motion and palpation.  No deformity noted.  Skin:    General: Skin is warm and dry.     Findings: No erythema or rash.  Neurological:     Mental Status: He is alert and oriented to person, place, and time. Mental status is at baseline.     Sensory: No sensory deficit.     Motor: No weakness.  Psychiatric:     Comments: Patient is  intoxicated     ED Results / Procedures / Treatments   Labs (all labs ordered are listed, but only abnormal results are displayed) Labs Reviewed  CBC WITH DIFFERENTIAL/PLATELET - Abnormal; Notable for the following components:      Result Value   Platelets 137 (*)    Abs Immature Granulocytes 0.09 (*)    All other components within normal limits  COMPREHENSIVE METABOLIC PANEL - Abnormal; Notable for the following components:   Potassium 3.0 (*)    Chloride 94 (*)  CO2 15 (*)    Glucose, Bld 521 (*)    Calcium 8.3 (*)    AST 230 (*)    ALT 126 (*)    Total Bilirubin 1.8 (*)    Anion gap 29 (*)    All other components within normal limits  ETHANOL - Abnormal; Notable for the following components:   Alcohol, Ethyl (B) 249 (*)    All other components within normal limits  LACTIC ACID, PLASMA - Abnormal; Notable for the following components:   Lactic Acid, Venous >9.0 (*)    All other components within normal limits  BETA-HYDROXYBUTYRIC ACID - Abnormal; Notable for the following components:   Beta-Hydroxybutyric Acid 1.76 (*)    All other components within normal limits  URINALYSIS, ROUTINE W REFLEX MICROSCOPIC - Abnormal; Notable for the following components:   Glucose, UA >=500 (*)    Hgb urine dipstick MODERATE (*)    Ketones, ur 20 (*)    Protein, ur 100 (*)    All other components within normal limits  BLOOD GAS, VENOUS - Abnormal; Notable for the following components:   pCO2, Ven 30 (*)    pO2, Ven 65 (*)    Bicarbonate 18.2 (*)    Acid-base deficit 5.6 (*)    All other components within normal limits  CBG MONITORING, ED - Abnormal; Notable for the following components:   Glucose-Capillary 569 (*)    All other components within normal limits  LACTIC ACID, PLASMA  BASIC METABOLIC PANEL  BASIC METABOLIC PANEL  BASIC METABOLIC PANEL  BASIC METABOLIC PANEL  BETA-HYDROXYBUTYRIC ACID  BETA-HYDROXYBUTYRIC ACID  MAGNESIUM  CBG MONITORING, ED    EKG EKG  Interpretation  Date/Time:  Thursday August 01 2021 22:05:07 EDT Ventricular Rate:  95 PR Interval:  145 QRS Duration: 101 QT Interval:  401 QTC Calculation: 505 R Axis:   13 Text Interpretation: Sinus rhythm Borderline T wave abnormalities Prolonged QT interval No significant change since last tracing Confirmed by Blanchie Dessert ([id number]) on 08/01/2021 10:31:51 PM  Radiology DG Ribs Unilateral W/Chest Right  Result Date: 08/01/2021 CLINICAL DATA:  Fall, pain EXAM: RIGHT RIBS AND CHEST - 3+ VIEW COMPARISON:  Chest radiograph dated 07/09/2021 FINDINGS: Lungs are clear.  No pleural effusion or pneumothorax. The heart is normal in size. No displaced right rib fracture is seen. IMPRESSION: Negative. Electronically Signed   By: Julian Hy M.D.   On: 08/01/2021 23:15   DG Hip Unilat W or Wo Pelvis 2-3 Views Right  Result Date: 08/01/2021 CLINICAL DATA:  Fall, pain EXAM: DG HIP (WITH OR WITHOUT PELVIS) 2-3V RIGHT COMPARISON:  None Available. FINDINGS: No fracture or dislocation is seen. Bilateral hip joint spaces are preserved. Surgical clips overlying the left femoral head. Visualized bony pelvis appears intact. IMPRESSION: Negative. Electronically Signed   By: Julian Hy M.D.   On: 08/01/2021 23:14    Procedures Procedures    Medications Ordered in ED Medications  insulin regular, human (MYXREDLIN) 100 units/ 100 mL infusion (has no administration in time range)  lactated ringers infusion (has no administration in time range)  dextrose 5 % in lactated ringers infusion (0 mLs Intravenous Hold 08/01/21 2334)  dextrose 50 % solution 0-50 mL (has no administration in time range)  potassium chloride 10 mEq in 100 mL IVPB (has no administration in time range)  thiamine tablet 100 mg (has no administration in time range)  folic acid (FOLVITE) tablet 1 mg (has no administration in time range)  metoCLOPramide (REGLAN) injection 10  mg (has no administration in time range)  lactated  ringers bolus 1,000 mL (1,000 mLs Intravenous New Bag/Given 08/01/21 2229)  sodium chloride 0.9 % bolus 1,000 mL (1,000 mLs Intravenous New Bag/Given 08/01/21 2229)    ED Course/ Medical Decision Making/ A&P                           Medical Decision Making Amount and/or Complexity of Data Reviewed Labs: ordered. Radiology: ordered.  Risk Prescription drug management.   Pt with multiple medical problems and comorbidities and presenting today with a complaint that caries a high risk for morbidity and mortality.  Here today with multiple issues.  Patient does appear intoxicated but also concerned that he may be in DKA.  Patient smells of ketones is tachycardic, appears dehydrated and has a blood sugar of 569 and has not been using his insulin the way he should.  Also patient had a fall and is having pain in the left chest and hip and concern for possible fracture.  Patient denies any head injury and at this time is awake alert and answers questions appropriately.  Low suspicion for an acute intra-abdominal pathology, UTI, pneumonia or other infectious pathology.  Suspect the DKA is most likely a result of not using his insulin and excessive alcohol use.  Patient given IV fluids.  Labs, imaging and EKG are pending. 11:38 PM I independently interpreted patient's labs and EKG.  EKG without acute findings today, CBC within normal limits, UA with ketones, VBG with compensated metabolic acidosis with a normal pH but a CO2 of 30 and a bicarb of 18, CMP today with an anion gap of 29 with a lactate greater than 9 with elevated LFTs with AST of 230, ALT of 126 and a total bilirubin of 1.8 with normal renal function.  Potassium level is also low at 3.0 and a blood sugar of 521.  Alcohol level is 249 and beta hydroxybutyric acid is 1.76.  I have independently visualized and interpreted pt's images today.  Chest x-ray and hip films today are negative for acute fracture.  Given patient's above findings he is in DKA  at this time.  He received 2 fluid boluses a total of 2 L which is approximately 30/kg.  He was started on maintenance IV fluid, potassium replacement and insulin drip.  Also magnesium is pending given patient's alcohol use.  Patient was also given thiamine and folate.  Patient will require admission to the hospital for further care.  Hospitalist was consulted.  CRITICAL CARE Performed by:   Total critical care time: 40 minutes Critical care time was exclusive of separately billable procedures and treating other patients. Critical care was necessary to treat or prevent imminent or life-threatening deterioration. Critical care was time spent personally by me on the following activities: development of treatment plan with patient and/or surrogate as well as nursing, discussions with consultants, evaluation of patient's response to treatment, examination of patient, obtaining history from patient or surrogate, ordering and performing treatments and interventions, ordering and review of laboratory studies, ordering and review of radiographic studies, pulse oximetry and re-evaluation of patient's condition.           Final Clinical Impression(s) / ED Diagnoses Final diagnoses:  Alcoholic intoxication with complication ([place])  Diabetic ketoacidosis without coma associated with type 2 diabetes mellitus ([place])  Fall, initial encounter  Rib contusion, right, initial encounter    Rx / DC Orders ED Discharge Orders  None         Blanchie Dessert, MD 08/01/21 [id number]

## 2021-08-01 NOTE — ED Triage Notes (Signed)
Pt BIB EMS from home for ETOH binging all day. Pt has not taken his meds or eaten food all day. Per EMS pt fell trying to get out off the car around 1630 this afternoon. Pt c/o right lower leg pain and nausea  Bolus and zofran given  Cbg- read high 166/94 94 hr 20 rr 93% room air

## 2021-08-02 DIAGNOSIS — E669 Obesity, unspecified: Secondary | ICD-10-CM | POA: Diagnosis not present

## 2021-08-02 DIAGNOSIS — D61818 Other pancytopenia: Secondary | ICD-10-CM | POA: Diagnosis present

## 2021-08-02 DIAGNOSIS — F1019 Alcohol abuse with unspecified alcohol-induced disorder: Secondary | ICD-10-CM | POA: Diagnosis not present

## 2021-08-02 DIAGNOSIS — F10929 Alcohol use, unspecified with intoxication, unspecified: Secondary | ICD-10-CM | POA: Diagnosis not present

## 2021-08-02 DIAGNOSIS — W19XXXA Unspecified fall, initial encounter: Secondary | ICD-10-CM

## 2021-08-02 DIAGNOSIS — E876 Hypokalemia: Secondary | ICD-10-CM

## 2021-08-02 DIAGNOSIS — K6389 Other specified diseases of intestine: Secondary | ICD-10-CM | POA: Diagnosis not present

## 2021-08-02 DIAGNOSIS — Z794 Long term (current) use of insulin: Secondary | ICD-10-CM | POA: Diagnosis not present

## 2021-08-02 DIAGNOSIS — Y908 Blood alcohol level of 240 mg/100 ml or more: Secondary | ICD-10-CM | POA: Diagnosis present

## 2021-08-02 DIAGNOSIS — I10 Essential (primary) hypertension: Secondary | ICD-10-CM | POA: Diagnosis present

## 2021-08-02 DIAGNOSIS — K7010 Alcoholic hepatitis without ascites: Secondary | ICD-10-CM | POA: Diagnosis not present

## 2021-08-02 DIAGNOSIS — Z91148 Patient's other noncompliance with medication regimen for other reason: Secondary | ICD-10-CM | POA: Diagnosis not present

## 2021-08-02 DIAGNOSIS — F411 Generalized anxiety disorder: Secondary | ICD-10-CM | POA: Diagnosis not present

## 2021-08-02 DIAGNOSIS — E1110 Type 2 diabetes mellitus with ketoacidosis without coma: Secondary | ICD-10-CM | POA: Diagnosis present

## 2021-08-02 DIAGNOSIS — G9341 Metabolic encephalopathy: Secondary | ICD-10-CM | POA: Diagnosis present

## 2021-08-02 DIAGNOSIS — E1142 Type 2 diabetes mellitus with diabetic polyneuropathy: Secondary | ICD-10-CM

## 2021-08-02 DIAGNOSIS — K3184 Gastroparesis: Secondary | ICD-10-CM | POA: Diagnosis present

## 2021-08-02 DIAGNOSIS — Z8679 Personal history of other diseases of the circulatory system: Secondary | ICD-10-CM | POA: Diagnosis not present

## 2021-08-02 DIAGNOSIS — Z6830 Body mass index (BMI) 30.0-30.9, adult: Secondary | ICD-10-CM | POA: Diagnosis not present

## 2021-08-02 DIAGNOSIS — R7401 Elevation of levels of liver transaminase levels: Secondary | ICD-10-CM | POA: Diagnosis not present

## 2021-08-02 DIAGNOSIS — F10930 Alcohol use, unspecified with withdrawal, uncomplicated: Secondary | ICD-10-CM | POA: Diagnosis not present

## 2021-08-02 DIAGNOSIS — F3181 Bipolar II disorder: Secondary | ICD-10-CM | POA: Diagnosis not present

## 2021-08-02 DIAGNOSIS — F10229 Alcohol dependence with intoxication, unspecified: Secondary | ICD-10-CM | POA: Diagnosis not present

## 2021-08-02 DIAGNOSIS — R059 Cough, unspecified: Secondary | ICD-10-CM | POA: Diagnosis present

## 2021-08-02 DIAGNOSIS — S20211A Contusion of right front wall of thorax, initial encounter: Secondary | ICD-10-CM | POA: Diagnosis present

## 2021-08-02 DIAGNOSIS — R197 Diarrhea, unspecified: Secondary | ICD-10-CM | POA: Diagnosis present

## 2021-08-02 DIAGNOSIS — F10939 Alcohol use, unspecified with withdrawal, unspecified: Secondary | ICD-10-CM

## 2021-08-02 DIAGNOSIS — T50901A Poisoning by unspecified drugs, medicaments and biological substances, accidental (unintentional), initial encounter: Secondary | ICD-10-CM | POA: Diagnosis present

## 2021-08-02 DIAGNOSIS — E8809 Other disorders of plasma-protein metabolism, not elsewhere classified: Secondary | ICD-10-CM | POA: Diagnosis present

## 2021-08-02 DIAGNOSIS — F10239 Alcohol dependence with withdrawal, unspecified: Secondary | ICD-10-CM | POA: Diagnosis not present

## 2021-08-02 DIAGNOSIS — E872 Acidosis, unspecified: Secondary | ICD-10-CM | POA: Diagnosis not present

## 2021-08-02 DIAGNOSIS — N179 Acute kidney failure, unspecified: Secondary | ICD-10-CM | POA: Diagnosis present

## 2021-08-02 DIAGNOSIS — K760 Fatty (change of) liver, not elsewhere classified: Secondary | ICD-10-CM | POA: Diagnosis present

## 2021-08-02 DIAGNOSIS — E1143 Type 2 diabetes mellitus with diabetic autonomic (poly)neuropathy: Secondary | ICD-10-CM | POA: Diagnosis present

## 2021-08-02 DIAGNOSIS — W010XXA Fall on same level from slipping, tripping and stumbling without subsequent striking against object, initial encounter: Secondary | ICD-10-CM | POA: Diagnosis present

## 2021-08-02 LAB — BASIC METABOLIC PANEL
Anion gap: 23 — ABNORMAL HIGH (ref 5–15)
Anion gap: 27 — ABNORMAL HIGH (ref 5–15)
Anion gap: 14 (ref 5–15)
Anion gap: 10 (ref 5–15)
BUN: 11 mg/dL (ref 6–20)
BUN: 11 mg/dL (ref 6–20)
BUN: 11 mg/dL (ref 6–20)
BUN: 10 mg/dL (ref 6–20)
CO2: 16 mmol/L — ABNORMAL LOW (ref 22–32)
CO2: 16 mmol/L — ABNORMAL LOW (ref 22–32)
CO2: 28 mmol/L (ref 22–32)
CO2: 28 mmol/L (ref 22–32)
Calcium: 7.6 mg/dL — ABNORMAL LOW (ref 8.9–10.3)
Calcium: 8.1 mg/dL — ABNORMAL LOW (ref 8.9–10.3)
Calcium: 8.2 mg/dL — ABNORMAL LOW (ref 8.9–10.3)
Calcium: 8.4 mg/dL — ABNORMAL LOW (ref 8.9–10.3)
Chloride: 93 mmol/L — ABNORMAL LOW (ref 98–111)
Chloride: 94 mmol/L — ABNORMAL LOW (ref 98–111)
Chloride: 97 mmol/L — ABNORMAL LOW (ref 98–111)
Chloride: 102 mmol/L (ref 98–111)
Creatinine, Ser: 1 mg/dL (ref 0.61–1.24)
Creatinine, Ser: 0.85 mg/dL (ref 0.61–1.24)
Creatinine, Ser: 0.92 mg/dL (ref 0.61–1.24)
Creatinine, Ser: 0.86 mg/dL (ref 0.61–1.24)
GFR, Estimated: >60 mL/min (ref >60)
GFR, Estimated: >60 mL/min (ref >60)
GFR, Estimated: >60 mL/min (ref >60)
GFR, Estimated: >60 mL/min (ref >60)
Glucose, Bld: 377 mg/dL — ABNORMAL HIGH (ref 70–99)
Glucose, Bld: 218 mg/dL — ABNORMAL HIGH (ref 70–99)
Glucose, Bld: 135 mg/dL — ABNORMAL HIGH (ref 70–99)
Glucose, Bld: 127 mg/dL — ABNORMAL HIGH (ref 70–99)
Potassium: 6.6 mmol/L (ref 3.5–5.1)
Potassium: 2.8 mmol/L — ABNORMAL LOW (ref 3.5–5.1)
Potassium: 2.7 mmol/L — CL (ref 3.5–5.1)
Potassium: 2.8 mmol/L — ABNORMAL LOW (ref 3.5–5.1)
Sodium: 132 mmol/L — ABNORMAL LOW (ref 135–145)
Sodium: 137 mmol/L (ref 135–145)
Sodium: 139 mmol/L (ref 135–145)
Sodium: 140 mmol/L (ref 135–145)

## 2021-08-02 LAB — GLUCOSE, CAPILLARY
Glucose-Capillary: 128 mg/dL — ABNORMAL HIGH (ref 70–99)
Glucose-Capillary: 130 mg/dL — ABNORMAL HIGH (ref 70–99)
Glucose-Capillary: 130 mg/dL — ABNORMAL HIGH (ref 70–99)
Glucose-Capillary: 134 mg/dL — ABNORMAL HIGH (ref 70–99)
Glucose-Capillary: 141 mg/dL — ABNORMAL HIGH (ref 70–99)
Glucose-Capillary: 148 mg/dL — ABNORMAL HIGH (ref 70–99)
Glucose-Capillary: 154 mg/dL — ABNORMAL HIGH (ref 70–99)
Glucose-Capillary: 158 mg/dL — ABNORMAL HIGH (ref 70–99)
Glucose-Capillary: 175 mg/dL — ABNORMAL HIGH (ref 70–99)
Glucose-Capillary: 191 mg/dL — ABNORMAL HIGH (ref 70–99)
Glucose-Capillary: 200 mg/dL — ABNORMAL HIGH (ref 70–99)
Glucose-Capillary: 208 mg/dL — ABNORMAL HIGH (ref 70–99)

## 2021-08-02 LAB — CBC
HCT: 37.5 % — ABNORMAL LOW (ref 39.0–52.0)
Hemoglobin: 13.3 g/dL (ref 13.0–17.0)
MCH: 28.7 pg (ref 26.0–34.0)
MCHC: 35.5 g/dL (ref 30.0–36.0)
MCV: 81 fL (ref 80.0–100.0)
Platelets: 126 10*3/uL — ABNORMAL LOW (ref 150–400)
RBC: 4.63 MIL/uL (ref 4.22–5.81)
RDW: 13.6 % (ref 11.5–15.5)
WBC: 6.5 10*3/uL (ref 4.0–10.5)
nRBC: 0 % (ref 0.0–0.2)

## 2021-08-02 LAB — BETA-HYDROXYBUTYRIC ACID
Beta-Hydroxybutyric Acid: 1.5 mmol/L — ABNORMAL HIGH (ref 0.05–0.27)
Beta-Hydroxybutyric Acid: 0.11 mmol/L (ref 0.05–0.27)

## 2021-08-02 LAB — LACTIC ACID, PLASMA
Lactic Acid, Venous: >9 mmol/L (ref 0.5–1.9)
Lactic Acid, Venous: >9 mmol/L (ref 0.5–1.9)
Lactic Acid, Venous: 6.6 mmol/L (ref 0.5–1.9)
Lactic Acid, Venous: 2.3 mmol/L (ref 0.5–1.9)

## 2021-08-02 LAB — CBG MONITORING, ED
Glucose-Capillary: 195 mg/dL — ABNORMAL HIGH (ref 70–99)
Glucose-Capillary: 221 mg/dL — ABNORMAL HIGH (ref 70–99)
Glucose-Capillary: 327 mg/dL — ABNORMAL HIGH (ref 70–99)
Glucose-Capillary: 431 mg/dL — ABNORMAL HIGH (ref 70–99)

## 2021-08-02 LAB — VOLATILES,BLD-ACETONE,ETHANOL,ISOPROP,METHANOL
Acetone, blood: <0.01 g/dL (ref 0.000–0.010)
Ethanol, blood: <0.01 g/dL (ref 0.000–0.010)
Isopropanol, blood: <0.01 g/dL (ref 0.000–0.010)
Methanol, blood: <0.01 g/dL (ref 0.000–0.010)

## 2021-08-02 LAB — AMMONIA: Ammonia: 26 umol/L (ref 9–35)

## 2021-08-02 LAB — MAGNESIUM: Magnesium: 1.6 mg/dL — ABNORMAL LOW (ref 1.7–2.4)

## 2021-08-02 LAB — PROTIME-INR
INR: 1.2 (ref 0.8–1.2)
Prothrombin Time: 15.4 seconds — ABNORMAL HIGH (ref 11.4–15.2)

## 2021-08-02 LAB — MRSA NEXT GEN BY PCR, NASAL: MRSA by PCR Next Gen: NOT DETECTED

## 2021-08-02 LAB — LIPASE, BLOOD: Lipase: 167 U/L — ABNORMAL HIGH (ref 11–51)

## 2021-08-02 LAB — OSMOLALITY: Osmolality: 288 mOsm/kg (ref 275–295)

## 2021-08-02 MED ORDER — INSULIN GLARGINE-YFGN 100 UNIT/ML ~~LOC~~ SOLN
15.0000 [IU] | SUBCUTANEOUS | Status: DC
  Administered 2021-08-02 – 2021-08-07 (×6): 15 [IU] via SUBCUTANEOUS
  Filled 2021-08-02 (×7): qty 0.15

## 2021-08-02 MED ORDER — PROCHLORPERAZINE EDISYLATE 10 MG/2ML IJ SOLN
10.0000 mg | INTRAMUSCULAR | Status: DC | PRN
  Administered 2021-08-02 – 2021-08-06 (×4): 10 mg via INTRAVENOUS
  Filled 2021-08-02 (×4): qty 2

## 2021-08-02 MED ORDER — POTASSIUM CHLORIDE CRYS ER 20 MEQ PO TBCR
40.0000 meq | EXTENDED_RELEASE_TABLET | ORAL | Status: AC
  Administered 2021-08-02 (×2): 40 meq via ORAL
  Filled 2021-08-02 (×2): qty 2

## 2021-08-02 MED ORDER — ACETAMINOPHEN 325 MG PO TABS
650.0000 mg | ORAL_TABLET | Freq: Four times a day (QID) | ORAL | Status: DC | PRN
  Administered 2021-08-03 – 2021-08-05 (×2): 650 mg via ORAL
  Filled 2021-08-02 (×3): qty 2

## 2021-08-02 MED ORDER — PANTOPRAZOLE SODIUM 40 MG IV SOLR
40.0000 mg | INTRAVENOUS | Status: DC
  Administered 2021-08-02: 40 mg via INTRAVENOUS
  Filled 2021-08-02: qty 10

## 2021-08-02 MED ORDER — LOSARTAN POTASSIUM 50 MG PO TABS
100.0000 mg | ORAL_TABLET | Freq: Every day | ORAL | Status: DC
  Administered 2021-08-02 – 2021-08-06 (×5): 100 mg via ORAL
  Filled 2021-08-02 (×5): qty 2

## 2021-08-02 MED ORDER — SODIUM CHLORIDE 0.9 % IV SOLN: INTRAVENOUS | Status: DC

## 2021-08-02 MED ORDER — POTASSIUM CHLORIDE 10 MEQ/100ML IV SOLN
10.0000 meq | INTRAVENOUS | Status: AC
  Administered 2021-08-02 (×6): 10 meq via INTRAVENOUS
  Filled 2021-08-02 (×5): qty 100

## 2021-08-02 MED ORDER — LABETALOL HCL 5 MG/ML IV SOLN
5.0000 mg | INTRAVENOUS | Status: DC | PRN
  Administered 2021-08-02 (×2): 10 mg via INTRAVENOUS
  Filled 2021-08-02 (×2): qty 4

## 2021-08-02 MED ORDER — AMLODIPINE BESYLATE 5 MG PO TABS
5.0000 mg | ORAL_TABLET | Freq: Every day | ORAL | Status: DC
  Administered 2021-08-02: 5 mg via ORAL
  Filled 2021-08-02: qty 1

## 2021-08-02 MED ORDER — POTASSIUM CHLORIDE 10 MEQ/100ML IV SOLN
10.0000 meq | INTRAVENOUS | Status: AC
  Administered 2021-08-02 (×4): 10 meq via INTRAVENOUS
  Filled 2021-08-02 (×4): qty 100

## 2021-08-02 MED ORDER — CHLORHEXIDINE GLUCONATE CLOTH 2 % EX PADS
6.0000 | MEDICATED_PAD | Freq: Every day | CUTANEOUS | Status: DC
Start: 2021-08-02 — End: 2021-08-06
  Administered 2021-08-02 – 2021-08-05 (×4): 6 via TOPICAL

## 2021-08-02 MED ORDER — INSULIN ASPART 100 UNIT/ML IJ SOLN
0.0000 [IU] | INTRAMUSCULAR | Status: DC
  Administered 2021-08-02: 2 [IU] via SUBCUTANEOUS
  Administered 2021-08-02: 3 [IU] via SUBCUTANEOUS
  Administered 2021-08-02: 2 [IU] via SUBCUTANEOUS
  Administered 2021-08-02: 3 [IU] via SUBCUTANEOUS
  Administered 2021-08-03: 5 [IU] via SUBCUTANEOUS
  Administered 2021-08-03 (×2): 3 [IU] via SUBCUTANEOUS
  Administered 2021-08-03: 5 [IU] via SUBCUTANEOUS
  Administered 2021-08-03: 3 [IU] via SUBCUTANEOUS
  Administered 2021-08-03: 5 [IU] via SUBCUTANEOUS
  Administered 2021-08-04 (×2): 3 [IU] via SUBCUTANEOUS
  Administered 2021-08-04 (×3): 5 [IU] via SUBCUTANEOUS
  Administered 2021-08-04: 8 [IU] via SUBCUTANEOUS
  Administered 2021-08-05: 5 [IU] via SUBCUTANEOUS
  Administered 2021-08-05: 3 [IU] via SUBCUTANEOUS
  Administered 2021-08-05 (×2): 5 [IU] via SUBCUTANEOUS
  Administered 2021-08-05: 3 [IU] via SUBCUTANEOUS
  Administered 2021-08-06: 5 [IU] via SUBCUTANEOUS
  Administered 2021-08-06: 2 [IU] via SUBCUTANEOUS
  Administered 2021-08-06: 8 [IU] via SUBCUTANEOUS
  Administered 2021-08-06: 3 [IU] via SUBCUTANEOUS
  Administered 2021-08-06: 5 [IU] via SUBCUTANEOUS
  Administered 2021-08-06 – 2021-08-07 (×3): 3 [IU] via SUBCUTANEOUS
  Administered 2021-08-07: 5 [IU] via SUBCUTANEOUS
  Administered 2021-08-07: 3 [IU] via SUBCUTANEOUS
  Administered 2021-08-07: 2 [IU] via SUBCUTANEOUS
  Administered 2021-08-07 – 2021-08-08 (×2): 5 [IU] via SUBCUTANEOUS
  Administered 2021-08-08: 3 [IU] via SUBCUTANEOUS

## 2021-08-02 MED ORDER — MAGNESIUM SULFATE 2 GM/50ML IV SOLN
2.0000 g | Freq: Once | INTRAVENOUS | Status: AC
  Administered 2021-08-02: 2 g via INTRAVENOUS
  Filled 2021-08-02: qty 50

## 2021-08-02 MED ORDER — LACTATED RINGERS IV BOLUS
1000.0000 mL | Freq: Once | INTRAVENOUS | Status: AC
  Administered 2021-08-02: 1000 mL via INTRAVENOUS

## 2021-08-02 MED ORDER — POTASSIUM CHLORIDE CRYS ER 20 MEQ PO TBCR
40.0000 meq | EXTENDED_RELEASE_TABLET | Freq: Once | ORAL | Status: AC
  Administered 2021-08-02: 40 meq via ORAL
  Filled 2021-08-02: qty 2

## 2021-08-02 MED ORDER — HYDRALAZINE HCL 20 MG/ML IJ SOLN
10.0000 mg | INTRAMUSCULAR | Status: DC | PRN
  Administered 2021-08-02 – 2021-08-05 (×6): 10 mg via INTRAVENOUS
  Filled 2021-08-02 (×6): qty 1

## 2021-08-02 MED ORDER — BOOST / RESOURCE BREEZE PO LIQD CUSTOM
1.0000 | Freq: Three times a day (TID) | ORAL | Status: DC
  Administered 2021-08-03 – 2021-08-04 (×4): 1 via ORAL

## 2021-08-02 MED ORDER — ACETAMINOPHEN 650 MG RE SUPP: 650.0000 mg | Freq: Four times a day (QID) | RECTAL | Status: DC | PRN

## 2021-08-02 MED ORDER — ORAL CARE MOUTH RINSE
15.0000 mL | OROMUCOSAL | Status: DC | PRN
Start: 2021-08-02 — End: 2021-08-08

## 2021-08-02 MED ORDER — KCL IN DEXTROSE-NACL 20-5-0.9 MEQ/L-%-% IV SOLN
INTRAVENOUS | Status: DC
  Filled 2021-08-02 (×2): qty 1000

## 2021-08-02 MED ORDER — LOSARTAN POTASSIUM 50 MG PO TABS: 100.0000 mg | ORAL_TABLET | Freq: Every day | ORAL | Status: DC

## 2021-08-02 NOTE — Assessment & Plan Note (Signed)
Question of EtOH withdrawal: pt requiring multiple rounds of high dose ativan, showing all the correct symptoms (tachycardia, hypertension, tremor, anxiety) Seems very with it mentally though for someone supposedly having visual and auditory hallucinations (usually they arnt as oriented as he is in my experience). Also specifically asking for "the good stuff" (benzos). Finally, delirium tremens doesn't typically occur while pt is drunk with BAL of 250. 1. Regardless, tachycardia, hypertension, and lactate are worrisome 2. Keeping patient on CIWA and PRN ativan 3. dont want to use precedex if avoidable given his torsades history.

## 2021-08-02 NOTE — Assessment & Plan Note (Addendum)
Appears fairly chronic and in pattern of EtOH hepatitis. 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 With INR of 1.2 and t bili of 1.8.  Maddrey's score going to be low. Mild RUQ abd pain that he attributes to trauma (fall). Will check a lipase as well this AM.

## 2021-08-02 NOTE — H&P (Signed)
History and Physical    Patient: [name] [name] LEX:[id number] DOB: 02-Mar-1960 DOA: 08/01/2021 DOS: the patient was seen and examined on 08/02/2021 PCP: Camillia Herter, NP  Patient coming from: Home  Chief Complaint:  Chief Complaint  Patient presents with   Fall   Alcohol Problem   HPI: [name] [name] is a 61 y.o. male with medical history significant of EtOH abuse, DM2, HTN.  Opiate abuse in past previously on suboxone but not any more for at least a month + now.  Accidental OD of seroquel + Loperamide causing torsades in past.  BPD 1.  Pt in to ED with c/o EtOH intoxication and mechanical fall.  Pt reports drinking large amounts of vodka daily.  Wife reports he has been binging recently and has not been taking his medication.  Patient reports today while intoxicated he fell after he tripped landing on his right hip and right ribs.  He is having pain in the ribs and hip and was concerned he may have broke something.  He denies any shortness of breath but complains of being extremely thirsty with polyuria and polydipsia.  Did have N/V in ED.  Cannot recall last time he used insulin.  Looks like he has been off of all meds for at least a month.  Last filled metformin in April based on pharmacy records.  Denies hitting head or LOC.  Denies fever, dysuria, diarrhea.    In ED: Pt with anion gap acidosis and BGL in the 500s initially. EDP initially concerned for DKA so started on IVF boluses and insulin drip. BHB only 1.5 though, turns out AG acidosis is due to his lactate being > 9.0! Confirmed on repeat blood draws.  Review of Systems: As mentioned in the history of present illness. All other systems reviewed and are negative. Past Medical History:  Diagnosis Date   Anxiety    Arthritis    Bipolar 1 disorder ([place])    Blood transfusion without reported diagnosis    Cataract    Chronic pain    Depressed bipolar disorder ([place])    Depression    Diabetes  mellitus without complication ([place])    Diabetic neuropathy ([place])    GERD (gastroesophageal reflux disease)    Herpes zoster 12/05/2008   Qualifier: Diagnosis of  By: Ronnald Ramp MD, Arvid Right.    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  Coupled with high doses of loperamide used for pain control.  Unintentional overdose -intractable VT leading to cardiogenic shock - ECMO   Hyperlipidemia    Hypertension    Neuromuscular disorder ([place])    nerve damage back, neck, and shoulder   Neuropathy in diabetes ([place])    Sleep apnea    has CPAP but cannot use it   Substance abuse ([place])    in past    Past Surgical History:  Procedure Laterality Date   COLONOSCOPY     EXTRACORPOREAL CIRCULATION  11/2015   FOR Cardiogenic shock related to intractable Torsades VT storm (prolonged QT from drug toxixcity)    INTRAOPERATIVE TRANSESOPHAGEAL ECHOCARDIOGRAM N/A 11/26/2016   Procedure: INTRAOPERATIVE TRANSESOPHAGEAL ECHOCARDIOGRAM;  Surgeon: Ivin Poot, MD;  Location: [facility];  Service: Open Heart Surgery;  Laterality: N/A;   IR ANGIOGRAM EXTREMITY LEFT  06/05/2020   IR PTA NON CORO-LOWER EXTREM  06/05/2020   IR RADIOLOGIST EVAL & MGMT  05/21/2020   IR US GUIDE VASC ACCESS  LEFT  06/05/2020   POLYPECTOMY     SHOULDER ARTHROSCOPY WITH ROTATOR CUFF REPAIR Right 2009   SPINE SURGERY  2010   TRANSTHORACIC ECHOCARDIOGRAM  07/2016; 12/10/2016   a. Prior to VT arrest: normal. EF 55-60%. Gr 1 DD.  mild LVH. Mildly dilated Aortic Root.;; b.  Normal LV size and function.  Mild LVH.  EF 55%.  No RWMA.  No valve abnormalities.   Social History:  reports that he has never smoked. He has never used smokeless tobacco. He reports current alcohol use. He reports that he does not use drugs.  Allergies  Allergen Reactions   Heparin Other (See Comments)    HIT Ab negative on 02/20/15, but SRA POSITIVE    Oxytetracycline Rash and Other (See  Comments)   Del-Mycin [Erythromycin] Other (See Comments)    All "mycin drugs" - unknown reaction   Sulfa Antibiotics Other (See Comments)    Unknown reaction    Family History  Problem Relation Age of Onset   Diabetes Mother    Hyperlipidemia Mother    Heart disease Father    Colon cancer Neg Hx    Colon polyps Neg Hx    Esophageal cancer Neg Hx    Rectal cancer Neg Hx    Stomach cancer Neg Hx     Prior to Admission medications   Medication Sig Start Date End Date Taking? Authorizing Provider  atorvastatin (LIPITOR) 80 MG tablet Take 1 tablet (80 mg total) by mouth daily. Patient not taking: Reported on 07/13/2021 01/26/21 07/24/21  Camillia Herter, NP  blood glucose meter kit and supplies KIT Dispense based on patient and insurance preference. Use up to four times daily as directed. 07/03/21   Little Ishikawa, MD  buprenorphine (SUBUTEX) 8 MG SUBL SL tablet Place 8 mg under the tongue 3 (three) times daily. Patient not taking: Reported on 08/01/2021    [provider]  chlordiazePOXIDE (LIBRIUM) 25 MG capsule $RemoveBe'50mg'gdNgTqbnM$  PO TID x 1D, then 25-$RemoveBef'50mg'fYalSADXSc$  PO BID X 1D, then 25-$RemoveBef'50mg'SSOPJAhDsu$  PO QD X 1D Patient not taking: Reported on 08/01/2021 07/10/21   Veryl Speak, MD  Dulaglutide (TRULICITY) 3.73 SK/8.7GO SOPN Inject 0.75 mg into the skin once a week. Patient not taking: Reported on 07/13/2021 01/25/21   Camillia Herter, NP  furosemide (LASIX) 20 MG tablet Take 1 tablet (20 mg total) by mouth daily for 7 days. Patient not taking: Reported on 07/13/2021 01/25/21 07/13/21  Camillia Herter, NP  hydrochlorothiazide (HYDRODIURIL) 50 MG tablet Take 1 tablet (50 mg total) by mouth daily. Patient not taking: Reported on 08/01/2021 07/04/21   Little Ishikawa, MD  insulin glargine (LANTUS) 100 UNIT/ML Solostar Pen Inject 15 Units into the skin at bedtime. Patient not taking: Reported on 07/13/2021 07/03/21   Little Ishikawa, MD  Insulin Pen Needle (PEN NEEDLES) 32G X 4 MM MISC 1 each by Does not apply  route 3 (three) times daily before meals. 07/03/21 08/02/21  Little Ishikawa, MD  losartan (COZAAR) 100 MG tablet TAKE 1 TABLET(100 MG) BY MOUTH DAILY Patient not taking: Reported on 07/13/2021 01/25/21   Camillia Herter, NP  metFORMIN (GLUCOPHAGE-XR) 500 MG 24 hr tablet Take 2 tablets (1,000 mg total) by mouth 2 (two) times daily. Patient not taking: Reported on 07/13/2021 01/25/21 07/24/21  Camillia Herter, NP  omeprazole (PRILOSEC OTC) 20 MG tablet Take 40 mg by mouth daily. Patient not taking: Reported on 07/13/2021    [provider]  prazosin (MINIPRESS) 2  MG capsule Take 2 mg by mouth at bedtime. Patient not taking: Reported on 07/13/2021    [provider]  QUEtiapine (SEROQUEL) 300 MG tablet Take 150 mg by mouth at bedtime. Patient not taking: Reported on 07/13/2021    [provider]  sertraline (ZOLOFT) 100 MG tablet Take 100 mg by mouth daily. Patient not taking: Reported on 07/13/2021 04/09/20   [provider]    Physical Exam: Vitals:   08/02/21 0227 08/02/21 0300 08/02/21 0315 08/02/21 0438  BP:  (!) 149/77    Pulse:  (!) 104 100   Resp:  (!) 23 19   Temp: 98.2 F (36.8 C)   99.3 F (37.4 C)  TempSrc: Oral   Oral  SpO2:  96% 95%   Weight:      Height:       Constitutional: Awake, tremor, anxious Eyes: PERRL, lids and conjunctivae normal ENMT: Mucous membranes are moist. Posterior pharynx clear of any exudate or lesions.Normal dentition.  Neck: normal, supple, no masses, no thyromegaly Respiratory: clear to auscultation bilaterally, no wheezing, no crackles. Normal respiratory effort. No accessory muscle use.  Cardiovascular: Regular rate and rhythm, no murmurs / rubs / gallops. No extremity edema. 2+ pedal pulses. No carotid bruits.  Abdomen: Mild RUQ TTP, but nothing elsewhere in abdomen.  Denying pain elsewhere. Musculoskeletal: no clubbing / cyanosis. No joint deformity upper and lower extremities. Good ROM, no contractures. Normal muscle  tone.  Skin: No rashes, lesions.  No diabetic foot ulcer. Neurologic: CN 2-12 grossly intact. Sensation intact, DTR normal. Strength 5/5 in all 4.  Psychiatric: Anxious, reports visual and auditory hallucinations, but AAOx3 and answers questions appropriately.  Data Reviewed:       Latest Ref Rng & Units 08/02/2021    2:45 AM 08/02/2021   12:13 AM 08/01/2021    9:49 PM  CMP  Glucose 70 - 99 mg/dL 218  377  521   BUN 6 - 20 mg/dL $Remove'11  11  13   'RGZEXSm$ Creatinine 0.61 - 1.24 mg/dL 0.85  1.00  1.19   Sodium 135 - 145 mmol/L 137  132  138   Potassium 3.5 - 5.1 mmol/L 2.8  6.6  3.0   Chloride 98 - 111 mmol/L 94  93  94   CO2 22 - 32 mmol/L $RemoveB'16  16  15   'iWjFyriJ$ Calcium 8.9 - 10.3 mg/dL 8.1  7.6  8.3   Total Protein 6.5 - 8.1 g/dL   7.5   Total Bilirubin 0.3 - 1.2 mg/dL   1.8   Alkaline Phos 38 - 126 U/L   68   AST 15 - 41 U/L   230   ALT 0 - 44 U/L   126     CBC    Component Value Date/Time   WBC 6.5 08/02/2021 0537   RBC 4.63 08/02/2021 0537   HGB 13.3 08/02/2021 0537   HGB 12.3 (L) 06/19/2020 1626   HCT 37.5 (L) 08/02/2021 0537   HCT 36.4 (L) 06/19/2020 1626   PLT 126 (L) 08/02/2021 0537   PLT 194 06/19/2020 1626   MCV 81.0 08/02/2021 0537   MCV 78 (L) 06/19/2020 1626   MCH 28.7 08/02/2021 0537   MCHC 35.5 08/02/2021 0537   RDW 13.6 08/02/2021 0537   RDW 14.5 06/19/2020 1626   LYMPHSABS 1.0 08/01/2021 2149   LYMPHSABS 1.1 11/30/2019 1122   MONOABS 0.5 08/01/2021 2149   EOSABS 0.0 08/01/2021 2149   EOSABS 0.1 11/30/2019 1122   BASOSABS  0.1 08/01/2021 2149   BASOSABS 0.0 11/30/2019 1122    BHB of 1.7  Lactate >9 for first 3 draws, finally down to 6.6 on most recent.  No rib fx on X ray.  EKG shows QTc of 500.  Assessment and Plan: * Lactic acidosis Pt with persistent lactic acidosis, Lactate > 9 on 3 serial draws despite 2L IVF bolus in ED. Unclear cause: -Not septic -Minimal abd pain, not c/w mesenteric ischemia -Liver tests not c/w ESLD -has tremor but not seizing -Pt  non compliant with metformin, hasnt taken this med in at-least 1 month or more.  Pharmacy fill records support this (last fill documented in April). curb sided PCCM due to persistent lactic acidosis. Another 2L bolus ordered as per PCCM recs (spoke with Dr. Duwayne Heck) Repeat lactate.  Alcohol withdrawal ([place]) Question of EtOH withdrawal: pt requiring multiple rounds of high dose ativan, showing all the correct symptoms (tachycardia, hypertension, tremor, anxiety) Seems very with it mentally though for someone supposedly having visual and auditory hallucinations (usually they arnt as oriented as he is in my experience). Also specifically asking for "the good stuff" (benzos). Finally, delirium tremens doesn't typically occur while pt is drunk with BAL of 250. Regardless, tachycardia, hypertension, and lactate are worrisome Keeping patient on CIWA and PRN ativan dont want to use precedex if avoidable given his torsades history.  Hypokalemia Replacing K. The one potassium of 6.6 is a lab error as demonstrated by repeat lab draw showing actual K of 2.8.  Transaminitis Appears fairly chronic and in pattern of EtOH hepatitis. 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 With INR of 1.2 and t bili of 1.8.  Maddrey's score going to be low. Mild RUQ abd pain that he attributes to trauma (fall). Will check a lipase as well this AM.  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 QTc of 500 today. Ativan only for nausea Avoid QT prolonging drugs! Replace Mg  Type 2 diabetes mellitus with diabetic polyneuropathy (H[facility]) Severe hyperglycemia today. BHB only 1.5 though. AG acidosis appears to be more due to lactic acidosis than DKA. No diabetic foot ulcer on my exam today. Insulin gtt per protocol for the moment. Likely convert to [facility] later  this AM Will leave the ordered BMP Q4H going for the moment, not because of DKA but to monitor anion gap with the lactic acidosis.  Essential hypertension PRN labetalol if needed      Advance Care Planning:   Code Status: Full Code  Consults: None formally, did curbside PCCM given persistent lactic acidosis  Family Communication: No family in room  Severity of Illness: The appropriate patient status for this patient is OBSERVATION. Observation status is judged to be reasonable and necessary in order to provide the required intensity of service to ensure the patient's safety. The patient's presenting symptoms, physical exam findings, and initial radiographic and laboratory data in the context of their medical condition is felt to place them at decreased risk for further clinical deterioration. Furthermore, it is anticipated that the patient will be medically stable for discharge from the hospital within 2 midnights of admission.   Author: Etta Quill., DO 08/02/2021 6:15 AM  For on call review [email or web address].

## 2021-08-02 NOTE — Assessment & Plan Note (Addendum)
Pt with persistent lactic acidosis, Lactate > 9 on 3 serial draws despite 2L IVF bolus in ED. Unclear cause: -Not septic -Minimal abd pain, not c/w mesenteric ischemia -Liver tests not c/w ESLD -has tremor but not seizing -Pt non compliant with metformin, hasnt taken this med in at-least 1 month or more.  Pharmacy fill records support this (last fill documented in April). 1. curb sided PCCM due to persistent lactic acidosis. 1. Another 2L bolus ordered as per PCCM recs (spoke with Dr. Duwayne Heck) 2. Repeat lactate.

## 2021-08-02 NOTE — Progress Notes (Addendum)
[facility] PROGRESS NOTE   [name] [name] ACZ:[id number] DOB: 07/17/1960 DOA: 08/01/2021  PCP: Camillia Herter, NP  Brief History/Interval Summary: 61 y.o. male with medical history significant of EtOH abuse, DM2, HTN.  Opiate abuse in past previously on suboxone but not any more for at least a month + now.  Accidental OD of seroquel + Loperamide causing torsades in past.  Brought in by family members as he had a mechanical fall after consuming large amounts of water.  Was noted to be intoxicated.  Noted to have anion gap acidosis with elevated glucose levels.  Concern was for DKA.  Noted to have lactic acid level greater than 9 as well.  Hospitalized for further management.   Consultants: None  Procedures: None    Subjective/Interval History: Patient noted to be confused and distracted.  Follows commands but does not answer questions appropriately.  History is limited at this time.    Assessment/Plan:  Lactic acidosis Lactic acid level was noted to be greater than 9 on 3 different occasions.  Etiology unclear.  Probably has something to do with alcohol intake.  Salicylate level was not elevated.  Will check serum osmolality. Case was discussed with critical care medicine by the admitting provider.  Fluid bolus was recommended.  Lactic acid level has come down to 6.  We will continue with IV fluids and recheck later today. Thiamine level will be checked. We will check ethylene glycol, methanol, isopropanol.  Alcohol withdrawal syndrome/acute metabolic encephalopathy Patient consumes large amounts of liquor.  He mentioned whiskey but according to H&P consumes vodka.  Noted to be confused.  Continue with the CIWA protocol.  Continue monitoring in stepdown.  May need to consider Precedex if this medication becomes an issue. We will check thiamine level.  Continue with multivitamins thiamine.  We will add folic acid.  We will give thiamine intravenously.  Questionable acute  pancreatitis Lipase level noted to be 167 Abdomen benign on examination.  Continue to monitor clinically. Consider imaging studies if his nausea does not improve.  Nausea Probably related to alcoholism.  Continue with as needed antiemetics.  We will add PPI.  Diabetes mellitus type 2 with concern for DKA Initially there was concern for DKA due to significantly elevated anion gap.  He was placed on insulin infusion.  CBGs have improved.  Anion gap has closed.  Bicarbonate level is noted to be 28.  We will transition him to subcutaneous insulin. HbA1c was 9.6 in January.  History of QT prolongation/history of torsades de points This was in the setting of accidental overdose of Seroquel and loperamide in the past.  Avoid QT prolonging medications.  QTc was noted to be 500 at the time of admission. Correct electrolytes.  Transaminitis Secondary to alcohol.  Monitor trend.  Check hepatitis panel.  Abdomen is benign on examination.  Hypokalemia/hypomagnesemia Likely secondary to GI loss.  We will aggressively correct.  Check magnesium level in the morning.  Magnesium was given earlier today.  Essential hypertension Monitor blood pressures closely.  H/o Opioid Addiction Was on suboxone but according to H&P he hasn't taken this in a month. According to [facility] it was last filled on 06/12/21.   Obesity Estimated body mass index is 30.81 kg/m as calculated from the following:   Height as of this encounter: '6\' 2"'$  (1.88 m).   Weight as of this encounter: 108.9 kg.   DVT Prophylaxis: SCDs Code Status: Full code Family Communication: No family at bedside. Disposition Plan: To  be determined  Status is: Observation The patient will require care spanning > 2 midnights and should be moved to inpatient because: Lactic acidosis, alcohol withdrawal      Medications: Scheduled:  Chlorhexidine Gluconate Cloth  6 each Topical Daily   multivitamin with minerals  1 tablet Oral Daily   potassium  chloride  40 mEq Oral Once   thiamine  100 mg Oral Daily   Continuous:  dextrose 5% lactated ringers 125 mL/hr at 08/02/21 0900   insulin 8 Units/hr (08/02/21 0332)   lactated ringers 125 mL/hr at 08/02/21 0000   potassium chloride 10 mEq (08/02/21 0904)   CBJ:[id number] **OR** acetaminophen, dextrose, labetalol, LORazepam **OR** LORazepam, mouth rinse, prochlorperazine  Antibiotics: Anti-infectives (From admission, onward)    None       Objective:  Vital Signs  Vitals:   08/02/21 0438 08/02/21 0738 08/02/21 0800 08/02/21 0900  BP:   (!) 182/155 (!) 189/79  Pulse:   86 87  Resp:   17 (!) 29  Temp: 99.3 F (37.4 C) 99 F (37.2 C)    TempSrc: Oral Oral    SpO2:   97% 93%  Weight:      Height:        Intake/Output Summary (Last 24 hours) at 08/02/2021 0944 Last data filed at 08/02/2021 0900 Gross per 24 hour  Intake 5074.99 ml  Output --  Net 5074.99 ml   Filed Weights   08/01/21 2133  Weight: 108.9 kg    General appearance: Confused and distracted.  Follows commands. Resp: Clear to auscultation bilaterally.  Normal effort Cardio: S1-S2 is normal regular.  No S3-S4.  No rubs murmurs or bruit GI: Abdomen is soft.  Nontender nondistended.  Bowel sounds are present normal.  No masses organomegaly Extremities: No edema.  Moving all of his extremities Neurologic:  No focal neurological deficits.    Lab Results:  Data Reviewed: I have personally reviewed following labs and reports of the imaging studies  CBC: Recent Labs  Lab 08/01/21 2149 08/02/21 0537  WBC 6.2 6.5  NEUTROABS 4.5  --   HGB 14.8 13.3  HCT 41.8 37.5*  MCV 80.5 81.0  PLT 137* 126*    Basic Metabolic Panel: Recent Labs  Lab 08/01/21 2149 08/02/21 0013 08/02/21 0245 08/02/21 0716  NA 138 132* 137 139  K 3.0* 6.6* 2.8* 2.7*  CL 94* 93* 94* 97*  CO2 15* 16* 16* 28  GLUCOSE 521* 377* 218* 135*  BUN '13 11 11 11  '$ CREATININE 1.19 1.00 0.85 0.92  CALCIUM 8.3* 7.6* 8.1* 8.2*  MG   --  1.6*  --   --     GFR: Estimated Creatinine Clearance: 113.6 mL/min (by C-G formula based on SCr of 0.92 mg/dL).  Liver Function Tests: Recent Labs  Lab 08/01/21 2149  AST 230*  ALT 126*  ALKPHOS 68  BILITOT 1.8*  PROT 7.5  ALBUMIN 4.3    Recent Labs  Lab 08/02/21 0716  LIPASE 167*   Recent Labs  Lab 08/02/21 0245  AMMONIA 26    Coagulation Profile: Recent Labs  Lab 08/02/21 0318  INR 1.2     CBG: Recent Labs  Lab 08/02/21 0330 08/02/21 0415 08/02/21 0543 08/02/21 0732 08/02/21 0844  GLUCAP 195* 208* 154* 134* 128*      Recent Results (from the past 240 hour(s))  MRSA Next Gen by PCR, Nasal     Status: None   Collection Time: 08/02/21  2:44 AM   Specimen: Nasal Mucosa;  Nasal Swab  Result Value Ref Range Status   MRSA by PCR Next Gen NOT DETECTED NOT DETECTED Final    Comment: (NOTE) The GeneXpert MRSA Assay (FDA approved for NASAL specimens only), is one component of a comprehensive MRSA colonization surveillance program. It is not intended to diagnose MRSA infection nor to guide or monitor treatment for MRSA infections. Test performance is not FDA approved in patients less than 48 years old. Performed at [facility], [place] [address], [place], [place] [address]       Radiology Studies: DG Ribs Unilateral W/Chest Right  Result Date: 08/01/2021 CLINICAL DATA:  Fall, pain EXAM: RIGHT RIBS AND CHEST - 3+ VIEW COMPARISON:  Chest radiograph dated 07/09/2021 FINDINGS: Lungs are clear.  No pleural effusion or pneumothorax. The heart is normal in size. No displaced right rib fracture is seen. IMPRESSION: Negative. Electronically Signed   By: Julian Hy M.D.   On: 08/01/2021 23:15   DG Hip Unilat W or Wo Pelvis 2-3 Views Right  Result Date: 08/01/2021 CLINICAL DATA:  Fall, pain EXAM: DG HIP (WITH OR WITHOUT PELVIS) 2-3V RIGHT COMPARISON:  None Available. FINDINGS: No fracture or dislocation is seen. Bilateral hip joint  spaces are preserved. Surgical clips overlying the left femoral head. Visualized bony pelvis appears intact. IMPRESSION: Negative. Electronically Signed   By: Julian Hy M.D.   On: 08/01/2021 23:14       LOS: 0 days   [facility] Pager on [email or web address]  08/02/2021, 9:44 AM

## 2021-08-02 NOTE — Assessment & Plan Note (Addendum)
Accidental OD of seroquel + loperamide in past which ended up causing torsades and intractable VT leading to cardiogenic shock and ECMO! QTc of 500 today. Ativan only for nausea 1. Avoid QT prolonging drugs! 2. Replace Mg

## 2021-08-02 NOTE — Assessment & Plan Note (Signed)
Replacing K. The one potassium of 6.6 is a lab error as demonstrated by repeat lab draw showing actual K of 2.8.

## 2021-08-02 NOTE — Assessment & Plan Note (Signed)
PRN labetalol if needed

## 2021-08-02 NOTE — Assessment & Plan Note (Signed)
Severe hyperglycemia today. BHB only 1.5 though. AG acidosis appears to be more due to lactic acidosis than DKA. No diabetic foot ulcer on my exam today. 1. Insulin gtt per protocol for the moment. 2. Likely convert to [facility] later this AM 3. Will leave the ordered BMP Q4H going for the moment, not because of DKA but to monitor anion gap with the lactic acidosis.

## 2021-08-03 ENCOUNTER — Inpatient Hospital Stay (HOSPITAL_COMMUNITY): Payer: Medicare Other

## 2021-08-03 DIAGNOSIS — I10 Essential (primary) hypertension: Secondary | ICD-10-CM | POA: Diagnosis not present

## 2021-08-03 DIAGNOSIS — E876 Hypokalemia: Secondary | ICD-10-CM | POA: Diagnosis not present

## 2021-08-03 DIAGNOSIS — E872 Acidosis, unspecified: Secondary | ICD-10-CM | POA: Diagnosis not present

## 2021-08-03 DIAGNOSIS — F10939 Alcohol use, unspecified with withdrawal, unspecified: Secondary | ICD-10-CM | POA: Diagnosis not present

## 2021-08-03 LAB — HEPATITIS PANEL, ACUTE
HCV Ab: NONREACTIVE
Hep A IgM: NONREACTIVE
Hep B C IgM: NONREACTIVE
Hepatitis B Surface Ag: NONREACTIVE

## 2021-08-03 LAB — COMPREHENSIVE METABOLIC PANEL
ALT: 94 U/L — ABNORMAL HIGH (ref 0–44)
AST: 160 U/L — ABNORMAL HIGH (ref 15–41)
Albumin: 3.5 g/dL (ref 3.5–5.0)
Alkaline Phosphatase: 49 U/L (ref 38–126)
Anion gap: 12 (ref 5–15)
BUN: 6 mg/dL (ref 6–20)
CO2: 23 mmol/L (ref 22–32)
Calcium: 8.4 mg/dL — ABNORMAL LOW (ref 8.9–10.3)
Chloride: 104 mmol/L (ref 98–111)
Creatinine, Ser: 0.7 mg/dL (ref 0.61–1.24)
GFR, Estimated: >60 mL/min (ref >60)
Glucose, Bld: 240 mg/dL — ABNORMAL HIGH (ref 70–99)
Potassium: 3.4 mmol/L — ABNORMAL LOW (ref 3.5–5.1)
Sodium: 139 mmol/L (ref 135–145)
Total Bilirubin: 1.7 mg/dL — ABNORMAL HIGH (ref 0.3–1.2)
Total Protein: 6.5 g/dL (ref 6.5–8.1)

## 2021-08-03 LAB — GLUCOSE, CAPILLARY
Glucose-Capillary: 165 mg/dL — ABNORMAL HIGH (ref 70–99)
Glucose-Capillary: 170 mg/dL — ABNORMAL HIGH (ref 70–99)
Glucose-Capillary: 190 mg/dL — ABNORMAL HIGH (ref 70–99)
Glucose-Capillary: 221 mg/dL — ABNORMAL HIGH (ref 70–99)
Glucose-Capillary: 228 mg/dL — ABNORMAL HIGH (ref 70–99)
Glucose-Capillary: 242 mg/dL — ABNORMAL HIGH (ref 70–99)

## 2021-08-03 LAB — CBC
HCT: 38.9 % — ABNORMAL LOW (ref 39.0–52.0)
Hemoglobin: 13.5 g/dL (ref 13.0–17.0)
MCH: 28.5 pg (ref 26.0–34.0)
MCHC: 34.7 g/dL (ref 30.0–36.0)
MCV: 82.2 fL (ref 80.0–100.0)
Platelets: 86 10*3/uL — ABNORMAL LOW (ref 150–400)
RBC: 4.73 MIL/uL (ref 4.22–5.81)
RDW: 13.9 % (ref 11.5–15.5)
WBC: 4.3 10*3/uL (ref 4.0–10.5)
nRBC: 0 % (ref 0.0–0.2)

## 2021-08-03 LAB — HEMOGLOBIN A1C
Hgb A1c MFr Bld: 9.4 % — ABNORMAL HIGH (ref 4.8–5.6)
Mean Plasma Glucose: 223.08 mg/dL

## 2021-08-03 LAB — MAGNESIUM: Magnesium: 1.9 mg/dL (ref 1.7–2.4)

## 2021-08-03 LAB — LIPASE, BLOOD: Lipase: 174 U/L — ABNORMAL HIGH (ref 11–51)

## 2021-08-03 MED ORDER — POTASSIUM CHLORIDE CRYS ER 20 MEQ PO TBCR
40.0000 meq | EXTENDED_RELEASE_TABLET | Freq: Once | ORAL | Status: AC
  Administered 2021-08-03: 40 meq via ORAL
  Filled 2021-08-03: qty 2

## 2021-08-03 MED ORDER — SODIUM CHLORIDE 0.45 % IV SOLN: INTRAVENOUS | Status: AC

## 2021-08-03 MED ORDER — PANTOPRAZOLE SODIUM 40 MG PO TBEC
40.0000 mg | DELAYED_RELEASE_TABLET | Freq: Every day | ORAL | Status: DC
  Administered 2021-08-03 – 2021-08-08 (×6): 40 mg via ORAL
  Filled 2021-08-03 (×6): qty 1

## 2021-08-03 MED ORDER — ROPINIROLE HCL 0.5 MG PO TABS
0.5000 mg | ORAL_TABLET | Freq: Once | ORAL | Status: AC
Start: 2021-08-03 — End: 2021-08-03
  Administered 2021-08-03: 0.5 mg via ORAL
  Filled 2021-08-03: qty 1

## 2021-08-03 MED ORDER — IOHEXOL 9 MG/ML PO SOLN
ORAL | Status: AC
  Filled 2021-08-03: qty 1000

## 2021-08-03 MED ORDER — IOHEXOL 300 MG/ML  SOLN
100.0000 mL | Freq: Once | INTRAMUSCULAR | Status: AC | PRN
  Administered 2021-08-03: 100 mL via INTRAVENOUS

## 2021-08-03 MED ORDER — POTASSIUM CHLORIDE 10 MEQ/100ML IV SOLN
10.0000 meq | INTRAVENOUS | Status: AC
  Administered 2021-08-03 (×2): 10 meq via INTRAVENOUS
  Filled 2021-08-03 (×2): qty 100

## 2021-08-03 MED ORDER — SODIUM CHLORIDE (PF) 0.9 % IJ SOLN
INTRAMUSCULAR | Status: AC
  Filled 2021-08-03: qty 50

## 2021-08-03 MED ORDER — FOLIC ACID 1 MG PO TABS
1.0000 mg | ORAL_TABLET | Freq: Every day | ORAL | Status: DC
  Administered 2021-08-03 – 2021-08-08 (×6): 1 mg via ORAL
  Filled 2021-08-03 (×6): qty 1

## 2021-08-03 MED ORDER — AMLODIPINE BESYLATE 10 MG PO TABS
10.0000 mg | ORAL_TABLET | Freq: Every day | ORAL | Status: DC
  Administered 2021-08-03 – 2021-08-08 (×6): 10 mg via ORAL
  Filled 2021-08-03 (×6): qty 1

## 2021-08-03 MED ORDER — HYDRALAZINE HCL 50 MG PO TABS
50.0000 mg | ORAL_TABLET | Freq: Three times a day (TID) | ORAL | Status: DC
  Administered 2021-08-03 – 2021-08-04 (×3): 50 mg via ORAL
  Filled 2021-08-03 (×3): qty 1

## 2021-08-03 MED ORDER — LOPERAMIDE HCL 2 MG PO CAPS
4.0000 mg | ORAL_CAPSULE | Freq: Three times a day (TID) | ORAL | Status: DC | PRN
  Administered 2021-08-03 – 2021-08-04 (×2): 4 mg via ORAL
  Filled 2021-08-03 (×2): qty 2

## 2021-08-03 NOTE — Progress Notes (Signed)
Will give one dose of Requip for RLS symptoms. Phillips Climes MD

## 2021-08-03 NOTE — Progress Notes (Signed)
[facility] PROGRESS NOTE   [name] [name] AJO:[id number] DOB: Mar 10, 1960 DOA: 08/01/2021  PCP: Camillia Herter, NP  Brief History/Interval Summary: 61 y.o. male with medical history significant of EtOH abuse, DM2, HTN.  Opiate abuse in past previously on suboxone but not any more for at least a month + now.  Accidental OD of seroquel + Loperamide causing torsades in past.  Brought in by family members as he had a mechanical fall after consuming large amounts of water.  Was noted to be intoxicated.  Noted to have anion gap acidosis with elevated glucose levels.  Concern was for DKA.  Noted to have lactic acid level greater than 9 as well.  Hospitalized for further management.   Consultants: None  Procedures: None    Subjective/Interval History: Patient mentions that he still feels nauseous and tremulous.  Complains of a cough without any expectoration.  Had a restless night.  Denies any abdominal pain currently.     Assessment/Plan:  Lactic acidosis Lactic acid level was noted to be greater than 9 on 3 different occasions.  Etiology unclear.  Probably has something to do with alcohol intake.  Salicylate level was not elevated.  Acetone, ethanol, isopropanol and methanol levels were undetectable.  Ethylene glycol is pending.  Lactic acid level did improve to 2.3.  Osmolality was 288.  No osmolar gap was noted.  Thiamine level is pending.  Alcohol withdrawal syndrome/acute metabolic encephalopathy Patient consumes large amounts of liquor.  He mentioned whiskey but according to H&P he consumes vodka.   Mentation seems to be improved slightly.  Continue with CIWA protocol.  Continue multivitamins thiamine.  We will add folic acid.  Cough Noted to have low-grade fever overnight.  Complains of a cough this morning.  May have aspirated.  Will check chest x-ray.  Questionable acute pancreatitis Lipase level noted to be 167.  Stable as of this morning.  Abdomen is benign.  Reason  for elevated lipase not entirely clear.  Could be from nausea.  We will go ahead and get a CT scan of his abdomen and pelvis.  Not had one in several years.  Nausea Probably related to alcoholism.  Continue with as needed antiemetics.  Continue PPI.  Diabetes mellitus type 2 with concern for DKA Initially there was concern for DKA due to significantly elevated anion gap.  He was placed on insulin infusion.  CBGs have improved.  Anion gap closed.  Bicarbonate level improved.  Patient was transitioned to subcutaneous insulin yesterday.  Monitor CBGs.   HbA1c was 9.6 in January.  History of QT prolongation/history of torsades de points This was in the setting of accidental overdose of Seroquel and loperamide in the past.  Avoid QT prolonging medications.  QTc was noted to be 500 at the time of admission. Continue to correct electrolytes.  Transaminitis Secondary to alcohol.  Slightly better today.  Hepatitis panel is pending.  Abdomen is benign on examination.  Follow-up on CT scan.  Hypokalemia/hypomagnesemia Likely secondary to GI loss.  Potassium level improved.  Will give additional dose today.  Magnesium level improved to 1.9.    Accelerated hypertension Blood pressure remains poorly controlled.  Remains on as needed hydralazine.  His amlodipine and losartan was resumed.  We will increase the dose of his amlodipine.  H/o Opioid Addiction Was on suboxone but according to H&P he hasn't taken this in a month. According to [facility] it was last filled on 06/12/21.   Thrombocytopenia Drop in platelet counts noted.  Likely due to alcoholism.  Currently not on any heparin products.  No evidence of bleeding.  Continue to trend.  Old labs reviewed.  Platelet counts have been as low as 66,000 back in June.  So there is an element of chronicity.  Obesity Estimated body mass index is 30.81 kg/m as calculated from the following:   Height as of this encounter: '6\' 2"'$  (1.88 m).   Weight as of this  encounter: 108.9 kg.   DVT Prophylaxis: SCDs Code Status: Full code Family Communication: No family at bedside.  Discussed with the son yesterday.  Patient prefers that I call patient's brother who could not be reached yesterday.  I will try again today.   Disposition Plan: To be determined  Status is: Inpatient Remains inpatient appropriate because: Lactic acidosis, alcohol withdrawal, accelerated hypertension     Medications: Scheduled:  amLODipine  5 mg Oral Daily   Chlorhexidine Gluconate Cloth  6 each Topical Daily   feeding supplement  1 Container Oral TID BM   insulin aspart  0-15 Units Subcutaneous Q4H   insulin glargine-yfgn  15 Units Subcutaneous Q24H   losartan  100 mg Oral Daily   multivitamin with minerals  1 tablet Oral Daily   pantoprazole (PROTONIX) IV  40 mg Intravenous Q24H   thiamine  100 mg Oral Daily   Continuous:  dextrose 5 % and 0.9 % NaCl with KCl 20 mEq/L 75 mL/hr at 08/03/21 0653   insulin Stopped (08/02/21 1325)   lactated ringers 125 mL/hr at 08/02/21 0000   UXL:[id number] **OR** acetaminophen, dextrose, hydrALAZINE, LORazepam **OR** LORazepam, mouth rinse, prochlorperazine  Antibiotics: Anti-infectives (From admission, onward)    None       Objective:  Vital Signs  Vitals:   08/03/21 0606 08/03/21 0700 08/03/21 0800 08/03/21 0831  BP: (!) 182/90 (!) 193/102  (!) 195/72  Pulse:  86    Resp:  (!) 22    Temp:   98.6 F (37 C)   TempSrc:   Oral   SpO2:  94%    Weight:      Height:        Intake/Output Summary (Last 24 hours) at 08/03/2021 0914 Last data filed at 08/03/2021 0653 Gross per 24 hour  Intake 2281.05 ml  Output 4100 ml  Net -1818.95 ml    Filed Weights   08/01/21 2133  Weight: 108.9 kg    General appearance: Awake alert.  In no distress Resp: Diminished air entry at the bases with few crackles. Cardio: S1-S2 is normal regular.  No S3-S4.  No rubs murmurs or bruit GI: Abdomen is soft.  Nontender  nondistended.  Bowel sounds are present normal.  No masses organomegaly Extremities: No edema.  Full range of motion of lower extremities. Neurologic:  No focal neurological deficits.     Lab Results:  Data Reviewed: I have personally reviewed following labs and reports of the imaging studies  CBC: Recent Labs  Lab 08/01/21 2149 08/02/21 0537 08/03/21 0729  WBC 6.2 6.5 4.3  NEUTROABS 4.5  --   --   HGB 14.8 13.3 13.5  HCT 41.8 37.5* 38.9*  MCV 80.5 81.0 82.2  PLT 137* 126* 86*     Basic Metabolic Panel: Recent Labs  Lab 08/02/21 0013 08/02/21 0245 08/02/21 0716 08/02/21 1344 08/03/21 0729  NA 132* 137 139 140 139  K 6.6* 2.8* 2.7* 2.8* 3.4*  CL 93* 94* 97* 102 104  CO2 16* 16* '28 28 23  '$ GLUCOSE 377* 218* 135*  127* 240*  BUN '11 11 11 10 6  '$ CREATININE 1.00 0.85 0.92 0.86 0.70  CALCIUM 7.6* 8.1* 8.2* 8.4* 8.4*  MG 1.6*  --   --   --  1.9     GFR: Estimated Creatinine Clearance: 130.6 mL/min (by C-G formula based on SCr of 0.7 mg/dL).  Liver Function Tests: Recent Labs  Lab 08/01/21 2149 08/03/21 0729  AST 230* 160*  ALT 126* 94*  ALKPHOS 68 49  BILITOT 1.8* 1.7*  PROT 7.5 6.5  ALBUMIN 4.3 3.5     Recent Labs  Lab 08/02/21 0716 08/03/21 0729  LIPASE 167* 174*    Recent Labs  Lab 08/02/21 0245  AMMONIA 26     Coagulation Profile: Recent Labs  Lab 08/02/21 0318  INR 1.2      CBG: Recent Labs  Lab 08/02/21 1638 08/02/21 2018 08/02/21 2334 08/03/21 0323 08/03/21 0748  GLUCAP 158* 200* 175* 228* 242*       Recent Results (from the past 240 hour(s))  MRSA Next Gen by PCR, Nasal     Status: None   Collection Time: 08/02/21  2:44 AM   Specimen: Nasal Mucosa; Nasal Swab  Result Value Ref Range Status   MRSA by PCR Next Gen NOT DETECTED NOT DETECTED Final    Comment: (NOTE) The GeneXpert MRSA Assay (FDA approved for NASAL specimens only), is one component of a comprehensive MRSA colonization surveillance program. It is not  intended to diagnose MRSA infection nor to guide or monitor treatment for MRSA infections. Test performance is not FDA approved in patients less than 88 years old. Performed at [facility], [place] [address], [place], [place] [address]       Radiology Studies: DG Ribs Unilateral W/Chest Right  Result Date: 08/01/2021 CLINICAL DATA:  Fall, pain EXAM: RIGHT RIBS AND CHEST - 3+ VIEW COMPARISON:  Chest radiograph dated 07/09/2021 FINDINGS: Lungs are clear.  No pleural effusion or pneumothorax. The heart is normal in size. No displaced right rib fracture is seen. IMPRESSION: Negative. Electronically Signed   By: Julian Hy M.D.   On: 08/01/2021 23:15   DG Hip Unilat W or Wo Pelvis 2-3 Views Right  Result Date: 08/01/2021 CLINICAL DATA:  Fall, pain EXAM: DG HIP (WITH OR WITHOUT PELVIS) 2-3V RIGHT COMPARISON:  None Available. FINDINGS: No fracture or dislocation is seen. Bilateral hip joint spaces are preserved. Surgical clips overlying the left femoral head. Visualized bony pelvis appears intact. IMPRESSION: Negative. Electronically Signed   By: Julian Hy M.D.   On: 08/01/2021 23:14       LOS: 1 day   [facility] Pager on [email or web address]  08/03/2021, 9:14 AM

## 2021-08-04 ENCOUNTER — Other Ambulatory Visit: Payer: Self-pay

## 2021-08-04 DIAGNOSIS — I10 Essential (primary) hypertension: Secondary | ICD-10-CM | POA: Diagnosis not present

## 2021-08-04 DIAGNOSIS — F3181 Bipolar II disorder: Secondary | ICD-10-CM

## 2021-08-04 DIAGNOSIS — E876 Hypokalemia: Secondary | ICD-10-CM | POA: Diagnosis not present

## 2021-08-04 DIAGNOSIS — F10939 Alcohol use, unspecified with withdrawal, unspecified: Secondary | ICD-10-CM | POA: Diagnosis not present

## 2021-08-04 DIAGNOSIS — E872 Acidosis, unspecified: Secondary | ICD-10-CM | POA: Diagnosis not present

## 2021-08-04 DIAGNOSIS — F1019 Alcohol abuse with unspecified alcohol-induced disorder: Secondary | ICD-10-CM

## 2021-08-04 LAB — CBC
HCT: 37.9 % — ABNORMAL LOW (ref 39.0–52.0)
Hemoglobin: 12.9 g/dL — ABNORMAL LOW (ref 13.0–17.0)
MCH: 28.5 pg (ref 26.0–34.0)
MCHC: 34 g/dL (ref 30.0–36.0)
MCV: 83.8 fL (ref 80.0–100.0)
Platelets: 66 10*3/uL — ABNORMAL LOW (ref 150–400)
RBC: 4.52 MIL/uL (ref 4.22–5.81)
RDW: 13.9 % (ref 11.5–15.5)
WBC: 3.4 10*3/uL — ABNORMAL LOW (ref 4.0–10.5)
nRBC: 0 % (ref 0.0–0.2)

## 2021-08-04 LAB — GLUCOSE, CAPILLARY
Glucose-Capillary: 225 mg/dL — ABNORMAL HIGH (ref 70–99)
Glucose-Capillary: 162 mg/dL — ABNORMAL HIGH (ref 70–99)
Glucose-Capillary: 244 mg/dL — ABNORMAL HIGH (ref 70–99)
Glucose-Capillary: 263 mg/dL — ABNORMAL HIGH (ref 70–99)
Glucose-Capillary: 153 mg/dL — ABNORMAL HIGH (ref 70–99)
Glucose-Capillary: 233 mg/dL — ABNORMAL HIGH (ref 70–99)

## 2021-08-04 LAB — COMPREHENSIVE METABOLIC PANEL
ALT: 74 U/L — ABNORMAL HIGH (ref 0–44)
AST: 101 U/L — ABNORMAL HIGH (ref 15–41)
Albumin: 3.4 g/dL — ABNORMAL LOW (ref 3.5–5.0)
Alkaline Phosphatase: 47 U/L (ref 38–126)
Anion gap: 8 (ref 5–15)
BUN: 6 mg/dL (ref 6–20)
CO2: 26 mmol/L (ref 22–32)
Calcium: 8.6 mg/dL — ABNORMAL LOW (ref 8.9–10.3)
Chloride: 106 mmol/L (ref 98–111)
Creatinine, Ser: 0.7 mg/dL (ref 0.61–1.24)
GFR, Estimated: >60 mL/min (ref >60)
Glucose, Bld: 239 mg/dL — ABNORMAL HIGH (ref 70–99)
Potassium: 3.1 mmol/L — ABNORMAL LOW (ref 3.5–5.1)
Sodium: 140 mmol/L (ref 135–145)
Total Bilirubin: 1.5 mg/dL — ABNORMAL HIGH (ref 0.3–1.2)
Total Protein: 6.3 g/dL — ABNORMAL LOW (ref 6.5–8.1)

## 2021-08-04 MED ORDER — POTASSIUM CHLORIDE CRYS ER 20 MEQ PO TBCR
40.0000 meq | EXTENDED_RELEASE_TABLET | Freq: Three times a day (TID) | ORAL | Status: AC
  Administered 2021-08-04 (×3): 40 meq via ORAL
  Filled 2021-08-04 (×3): qty 2

## 2021-08-04 MED ORDER — HYDRALAZINE HCL 50 MG PO TABS
75.0000 mg | ORAL_TABLET | Freq: Three times a day (TID) | ORAL | Status: DC
  Administered 2021-08-04 – 2021-08-08 (×12): 75 mg via ORAL
  Filled 2021-08-04 (×13): qty 1

## 2021-08-04 MED ORDER — GLUCERNA SHAKE PO LIQD
237.0000 mL | Freq: Three times a day (TID) | ORAL | Status: DC
  Administered 2021-08-04 – 2021-08-08 (×11): 237 mL via ORAL
  Filled 2021-08-04 (×14): qty 237

## 2021-08-04 MED ORDER — MAGNESIUM SULFATE 2 GM/50ML IV SOLN
2.0000 g | Freq: Once | INTRAVENOUS | Status: AC
  Administered 2021-08-04: 2 g via INTRAVENOUS
  Filled 2021-08-04: qty 50

## 2021-08-04 NOTE — Evaluation (Signed)
Physical Therapy Evaluation Patient Details Name: [name] [name] MRN: [id number] DOB: 02-12-1960 Today's Date: 08/04/2021  History of Present Illness  patient is a 61 year old male who was admitted with lactic acidosis, alcohol withdrawl syndrome, acute metabolic encephalopathy, acute pancreatitis.   PMH: EtOH abuse, DM II, HTN, h/o accidental OD  Clinical Impression  Pt admitted as above and presenting with functional mobility limitations 2* generalized weakness and balance deficits.  This date, pt up to ambulate limited distance in room and up to recliner - distance ltd by elevated BP.  Pt should progress to dc home with follow up HHPT dependent on acute stay progress.     Recommendations for follow up therapy are one component of a multi-disciplinary discharge planning process, led by the attending physician.  Recommendations may be updated based on patient status, additional functional criteria and insurance authorization.  Follow Up Recommendations Home health PT (dependent on acute stay progress)      Assistance Recommended at Discharge PRN  Patient can return home with the following  A little help with walking and/or transfers;A little help with bathing/dressing/bathroom;Assistance with cooking/housework;Assist for transportation;Help with stairs or ramp for entrance    Equipment Recommendations Other (comment) (TBD closer to dc date)  Recommendations for Other [name]       Functional Status Assessment Patient has had a recent decline in their functional status and demonstrates the ability to make significant improvements in function in a reasonable and predictable amount of time.     Precautions / Restrictions Precautions Precautions: Fall Precaution Comments: monitor BP Restrictions Weight Bearing Restrictions: No      Mobility  Bed Mobility Overal bed mobility: Needs Assistance Bed Mobility: Supine to Sit     Supine to sit: Mod assist, HOB elevated      General bed mobility comments: with physical assist for BLE to edge of bed and to bring trunk into midline on EOB.    Transfers Overall transfer level: Needs assistance Equipment used: Rolling walker (2 wheels) Transfers: Sit to/from Stand Sit to Stand: Min assist, Mod assist, +2 safety/equipment, From elevated surface           General transfer comment: cues for LE management and use of UEs to self assist    Ambulation/Gait Ambulation/Gait assistance: Min assist, +2 physical assistance, +2 safety/equipment Gait Distance (Feet): 18 Feet Assistive device: Rolling walker (2 wheels) Gait Pattern/deviations: Step-to pattern, Step-through pattern, Decreased step length - right, Decreased step length - left, Shuffle, Trunk flexed, Staggering left, Staggering right Gait velocity: decr     General Gait Details: short shuffling gait with cues for posture and position from [employer]            Wheelchair Mobility    Modified Rankin (Stroke Patients Only)       Balance Overall balance assessment: Needs assistance Sitting-balance support: No upper extremity supported, Feet supported Sitting balance-Leahy Scale: Fair     Standing balance support: Bilateral upper extremity supported Standing balance-Leahy Scale: Poor                               Pertinent Vitals/Pain Pain Assessment Pain Assessment: No/denies pain    Home Living Family/patient expects to be discharged to:: Private residence Living Arrangements: Alone   Type of Home: House Home Access: Level entry       Home Layout: One level        Prior Function Prior Level  of Function : Independent/Modified Independent                     Hand Dominance        Extremity/Trunk Assessment   Upper Extremity Assessment Upper Extremity Assessment: Generalized weakness    Lower Extremity Assessment Lower Extremity Assessment: Generalized weakness;RLE deficits/detail;LLE  deficits/detail RLE Sensation: history of peripheral neuropathy LLE Sensation: history of peripheral neuropathy    Cervical / Trunk Assessment Cervical / Trunk Assessment: Normal  Communication   Communication: No difficulties  Cognition Arousal/Alertness: Awake/alert Behavior During Therapy: WFL for tasks assessed/performed Overall Cognitive Status: Within Functional Limits for tasks assessed                                 General Comments: noted to have some delayed reponses at times. patient reported having lost parents recently.        General Comments      Exercises     Assessment/Plan    PT Assessment Patient needs continued PT [name]  PT Problem List Decreased strength;Decreased activity tolerance;Decreased balance;Decreased mobility;Decreased knowledge of use of DME;Obesity       PT Treatment Interventions DME instruction;Gait training;Stair training;Functional mobility training;Therapeutic activities;Therapeutic exercise;Patient/family education;Balance training    PT Goals (Current goals can be found in the Care Plan section)  Acute Rehab PT Goals Patient Stated Goal: REgain IND PT Goal Formulation: With patient Time For Goal Achievement: 08/18/21 Potential to Achieve Goals: Good    Frequency Min 3X/week     Co-evaluation PT/OT/SLP Co-Evaluation/Treatment: Yes Reason for Co-Treatment: For patient/therapist safety;To address functional/ADL transfers PT goals addressed during session: Mobility/safety with mobility OT goals addressed during session: ADL's and self-care       AM-PAC PT "6 Clicks" Mobility  Outcome Measure Help needed turning from your back to your side while in a flat bed without using bedrails?: A Little Help needed moving from lying on your back to sitting on the side of a flat bed without using bedrails?: A Lot Help needed moving to and from a bed to a chair (including a wheelchair)?: A Lot Help needed standing up  from a chair using your arms (e.g., wheelchair or bedside chair)?: A Lot Help needed to walk in hospital room?: A Lot Help needed climbing 3-5 steps with a railing? : A Lot 6 Click Score: 13    End of Session Equipment Utilized During Treatment: Gait belt Activity Tolerance: Patient limited by fatigue Patient left: in chair;with call bell/phone within reach;with chair alarm set Nurse Communication: Mobility status PT Visit Diagnosis: Unsteadiness on feet (R26.81);Muscle weakness (generalized) (M62.81);Difficulty in walking, not elsewhere classified (R26.2)    Time: 1275-1700 PT Time Calculation (min) (ACUTE ONLY): 23 min   Charges:   PT Evaluation $PT Eval Low Complexity: 1 Low          [name] [name] Pager [phone] Office [phone]   [name] [name] 08/04/2021, 1:51 PM

## 2021-08-04 NOTE — Consult Note (Signed)
[facility] Face-to-Face Psychiatry Consult   Reason for Consult:'' ? Heavy alcohol intake/alcoholism. History of depression. Depression likely contributing to his alcoholism. 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 male patient admitted with alcohol problem and fall.  HPI:  Patient is a 61 year old male with a history of HTN, DM2, chronic pain, Bipolar II disorder, alcohol use disorder severe and Opiates abuse who was on Suboxone treatment until a month ago. Patient was brought to the hospital by EMS for elevated blood sugar, alcohol intoxication and a fall. Patient reports that he is not complaint with his medications for mental illness for more than a year. He states that he has been self medicating by drinking alcohol heavily since his parents died 3 months apart over a year ago. He reports worsening depression characterized by lack of motivations, social withdrawal, low energy level, hopelessness and crying episodes but he denies suicidal/homicidal ideations, intent or plan. Patient states that he used to care for his parents and he partially blame himself for not been able to safe them. He states that he used to see a psychiatrist in [place] who prescribe Sertraline, Seroquel, Zyprexa and Prazosin that was working fine and would like to start his medications again. He states that he did  not talk to a therapist for grief counseling but good very good support from his church.   Today, he is alert, awake, oriented x 3 but reports anxiety, and appears fidgety and tremulous.He denies mood swings, psychosis and delusional thinking.  Past Psychiatric History: as above  Risk to Self:  denies Risk to Others:  denies Prior Inpatient Therapy:  yes Prior Outpatient Therapy:  yes  Past Medical History:  Past Medical History:  Diagnosis Date   Anxiety    Arthritis    Bipolar 1 disorder ([place])    Blood transfusion without reported diagnosis    Cataract    Chronic pain    Depressed bipolar disorder ([place])    Depression    Diabetes mellitus without complication ([place])    Diabetic neuropathy ([place])    GERD (gastroesophageal reflux disease)    Herpes zoster 12/05/2008   Qualifier: Diagnosis of  By: Ronnald Ramp MD, Arvid Right.    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  Coupled with high doses of loperamide used for pain control.  Unintentional overdose -intractable VT leading to cardiogenic shock - ECMO   Hyperlipidemia    Hypertension    Neuromuscular disorder ([place])    nerve damage back, neck, and shoulder   Neuropathy in diabetes ([place])    Sleep apnea    has CPAP but cannot use it   Substance abuse ([place])    in past     Past Surgical History:  Procedure Laterality Date   COLONOSCOPY     EXTRACORPOREAL CIRCULATION  11/2015   FOR Cardiogenic  shock related to intractable [place] VT storm (prolonged QT from drug toxixcity)    INTRAOPERATIVE TRANSESOPHAGEAL ECHOCARDIOGRAM N/A 11/26/2016   Procedure: INTRAOPERATIVE TRANSESOPHAGEAL ECHOCARDIOGRAM;  Surgeon: Ivin Poot, MD;  Location: [place];  Service: Open Heart Surgery;  Laterality: N/A;   IR ANGIOGRAM EXTREMITY LEFT  06/05/2020   IR PTA NON CORO-LOWER EXTREM  06/05/2020   IR RADIOLOGIST EVAL & MGMT  05/21/2020   IR US GUIDE VASC ACCESS LEFT   06/05/2020   POLYPECTOMY     SHOULDER ARTHROSCOPY WITH ROTATOR CUFF REPAIR Right 2009   SPINE SURGERY  2010   TRANSTHORACIC ECHOCARDIOGRAM  07/2016; 12/10/2016   a. Prior to VT arrest: normal. EF 55-60%. Gr 1 DD.  mild LVH. Mildly dilated Aortic Root.;; b.  Normal LV size and function.  Mild LVH.  EF 55%.  No RWMA.  No valve abnormalities.   Family History:  Family History  Problem Relation Age of Onset   Diabetes Mother    Hyperlipidemia Mother    Heart disease Father    Colon cancer Neg Hx    Colon polyps Neg Hx    Esophageal cancer Neg Hx    Rectal cancer Neg Hx    Stomach cancer Neg Hx    Family Psychiatric  History:  Social History:  Social History   Substance and Sexual Activity  Alcohol Use Yes   Comment: 07/09/2021 748m     Social History   Substance and Sexual Activity  Drug Use No    Social History   Socioeconomic History   Marital status: Divorced    Spouse name: Not on file   Number of children: 3   Years of education: Not on file   Highest education level: Not on file  Occupational History   Occupation: Disabled  Tobacco Use   Smoking status: Never   Smokeless tobacco: Never  Vaping Use   Vaping Use: Never used  Substance and Sexual Activity   Alcohol use: Yes    Comment: 07/09/2021 7523m  Drug use: No   Sexual activity: Not Currently  Other Topics Concern   Not on file  Social History Narrative   ** Merged History Encounter **       Social Determinants of Health   Financial Resource Strain: Not on file  Food Insecurity: Not on file  Transportation Needs: Not on file  Physical Activity: Not on file  Stress: Not on file  Social Connections: Not on file   Additional Social History:    Allergies:   Allergies  Allergen Reactions   Heparin Other (See Comments)    HIT Ab negative on 02/20/15, but SRA POSITIVE    Oxytetracycline Rash and Other (See Comments)   Del-Mycin [Erythromycin] Other (See Comments)    All "mycin drugs" - unknown  reaction   Sulfa Antibiotics Other (See Comments)    Unknown reaction    Labs:  Results for orders placed or performed during the hospital encounter of 08/01/21 (from the past 48 hour(s))  Glucose, capillary     Status: Abnormal   Collection Time: 08/02/21 12:13 PM  Result Value Ref Range   Glucose-Capillary 191 (H) 70 - 99 mg/dL    Comment: Glucose reference range applies only to samples taken after fasting for at least 8 hours.   Comment 1 Notify RN    Comment 2 Document in Chart   Glucose, capillary     Status: Abnormal   Collection Time: 08/02/21  1:20 PM  Result Value Ref  Range   Glucose-Capillary 141 (H) 70 - 99 mg/dL    Comment: Glucose reference range applies only to samples taken after fasting for at least 8 hours.  Basic metabolic panel     Status: Abnormal   Collection Time: 08/02/21  1:44 PM  Result Value Ref Range   Sodium 140 135 - 145 mmol/L   Potassium 2.8 (L) 3.5 - 5.1 mmol/L   Chloride 102 98 - 111 mmol/L   CO2 28 22 - 32 mmol/L   Glucose, Bld 127 (H) 70 - 99 mg/dL    Comment: Glucose reference range applies only to samples taken after fasting for at least 8 hours.   BUN 10 6 - 20 mg/dL   Creatinine, Ser 0.86 0.61 - 1.24 mg/dL   Calcium 8.4 (L) 8.9 - 10.3 mg/dL   GFR, Estimated >60 >60 mL/min    Comment: (NOTE) Calculated using the CKD-EPI Creatinine Equation (2021)    Anion gap 10 5 - 15    Comment: Performed at [facility], [place] [address] [place], [place] [address]  Lactic acid, plasma     Status: Abnormal   Collection Time: 08/02/21  1:44 PM  Result Value Ref Range   Lactic Acid, Venous 2.3 (HH) 0.5 - 1.9 mmol/L    Comment: CRITICAL RESULT CALLED TO, READ BACK BY AND VERIFIED WITH RN A KOONTZ AT 8119 08/02/21 CRUICKSHANK A Performed at [facility], [place] [address], [place], [place] [address]   Glucose, capillary     Status: Abnormal   Collection Time: 08/02/21  4:38 PM  Result Value Ref Range    Glucose-Capillary 158 (H) 70 - 99 mg/dL    Comment: Glucose reference range applies only to samples taken after fasting for at least 8 hours.   Comment 1 Notify RN    Comment 2 Document in Chart   Glucose, capillary     Status: Abnormal   Collection Time: 08/02/21  8:18 PM  Result Value Ref Range   Glucose-Capillary 200 (H) 70 - 99 mg/dL    Comment: Glucose reference range applies only to samples taken after fasting for at least 8 hours.   Comment 1 Notify RN    Comment 2 Document in Chart   Glucose, capillary     Status: Abnormal   Collection Time: 08/02/21 11:34 PM  Result Value Ref Range   Glucose-Capillary 175 (H) 70 - 99 mg/dL    Comment: Glucose reference range applies only to samples taken after fasting for at least 8 hours.   Comment 1 Notify RN    Comment 2 Document in Chart   Glucose, capillary     Status: Abnormal   Collection Time: 08/03/21  3:23 AM  Result Value Ref Range   Glucose-Capillary 228 (H) 70 - 99 mg/dL    Comment: Glucose reference range applies only to samples taken after fasting for at least 8 hours.   Comment 1 Notify RN    Comment 2 Document in Chart   Magnesium     Status: None   Collection Time: 08/03/21  7:29 AM  Result Value Ref Range   Magnesium 1.9 1.7 - 2.4 mg/dL    Comment: Performed at [facility], [place] [address], [place], [place] [address]  Hemoglobin A1c     Status: Abnormal   Collection Time: 08/03/21  7:29 AM  Result Value Ref Range   Hgb A1c MFr Bld 9.4 (H) 4.8 - 5.6 %    Comment: (NOTE)  Pre diabetes:          5.7%-6.4%  Diabetes:              >6.4%  Glycemic control for   <7.0% adults with diabetes    Mean Plasma Glucose 223.08 mg/dL    Comment: Performed at [place] [address], [place], [place] [address]  Hepatitis panel, acute     Status: None   Collection Time: 08/03/21  7:29 AM  Result Value Ref Range   Hepatitis B Surface Ag NON REACTIVE NON REACTIVE   HCV Ab NON REACTIVE NON REACTIVE     Comment: (NOTE) Nonreactive HCV antibody screen is consistent with no HCV infections,  unless recent infection is suspected or other evidence exists to indicate HCV infection.     Hep A IgM NON REACTIVE NON REACTIVE   Hep B C IgM NON REACTIVE NON REACTIVE    Comment: Performed at [facility], [place] [address], [place], [place] [address]  CBC     Status: Abnormal   Collection Time: 08/03/21  7:29 AM  Result Value Ref Range   WBC 4.3 4.0 - 10.5 K/uL   RBC 4.73 4.22 - 5.81 MIL/uL   Hemoglobin 13.5 13.0 - 17.0 g/dL   HCT 38.9 (L) 39.0 - 52.0 %   MCV 82.2 80.0 - 100.0 fL   MCH 28.5 26.0 - 34.0 pg   MCHC 34.7 30.0 - 36.0 g/dL   RDW 13.9 11.5 - 15.5 %   Platelets 86 (L) 150 - 400 K/uL    Comment: SPECIMEN CHECKED FOR CLOTS Immature Platelet Fraction may be clinically indicated, consider ordering this additional test [id number] REPEATED TO VERIFY PLATELET COUNT CONFIRMED BY SMEAR    nRBC 0.0 0.0 - 0.2 %    Comment: Performed at [facility], [place] [address], [place],  [address]  Comprehensive metabolic panel     Status: Abnormal   Collection Time: 08/03/21  7:29 AM  Result Value Ref Range   Sodium 139 135 - 145 mmol/L   Potassium 3.4 (L) 3.5 - 5.1 mmol/L    Comment: DELTA CHECK NOTED MODERATE HEMOLYSIS    Chloride 104 98 - 111 mmol/L   CO2 23 22 - 32 mmol/L   Glucose, Bld 240 (H) 70 - 99 mg/dL    Comment: Glucose reference range applies only to samples taken after fasting for at least 8 hours.   BUN 6 6 - 20 mg/dL   Creatinine, Ser 0.70 0.61 - 1.24 mg/dL   Calcium 8.4 (L) 8.9 - 10.3 mg/dL   Total Protein 6.5 6.5 - 8.1 g/dL   Albumin 3.5 3.5 - 5.0 g/dL   AST 160 (H) 15 - 41 U/L   ALT 94 (H) 0 - 44 U/L   Alkaline Phosphatase 49 38 - 126 U/L   Total Bilirubin 1.7 (H) 0.3 - 1.2 mg/dL   GFR, Estimated >60 >60 mL/min    Comment: (NOTE) Calculated using the CKD-EPI Creatinine Equation (2021)    Anion gap 12 5 - 15    Comment: Performed at [facility], [place] [address] [place], [place] [address]  Lipase, blood     Status: Abnormal   Collection Time: 08/03/21  7:29 AM  Result Value Ref Range   Lipase 174 (H) 11 - 51 U/L    Comment: Performed at [facility], [place] [address] [place], [place] [address]  Glucose, capillary     Status: Abnormal  Collection Time: 08/03/21  7:48 AM  Result Value Ref Range   Glucose-Capillary 242 (H) 70 - 99 mg/dL    Comment: Glucose reference range applies only to samples taken after fasting for at least 8 hours.  Glucose, capillary     Status: Abnormal   Collection Time: 08/03/21 11:31 AM  Result Value Ref Range   Glucose-Capillary 170 (H) 70 - 99 mg/dL    Comment: Glucose reference range applies only to samples taken after fasting for at least 8 hours.  Glucose, capillary     Status: Abnormal   Collection Time: 08/03/21  4:00 PM  Result Value Ref Range   Glucose-Capillary 221 (H) 70 - 99 mg/dL    Comment: Glucose reference range applies only to samples taken after fasting for at least 8 hours.  Glucose, capillary     Status: Abnormal   Collection Time: 08/03/21  8:26 PM  Result Value Ref Range   Glucose-Capillary 190 (H) 70 - 99 mg/dL    Comment: Glucose reference range applies only to samples taken after fasting for at least 8 hours.  Glucose, capillary     Status: Abnormal   Collection Time: 08/03/21 11:33 PM  Result Value Ref Range   Glucose-Capillary 165 (H) 70 - 99 mg/dL    Comment: Glucose reference range applies only to samples taken after fasting for at least 8 hours.   Comment 1 Notify RN   Glucose, capillary     Status: Abnormal   Collection Time: 08/04/21  3:19 AM  Result Value Ref Range   Glucose-Capillary 225 (H) 70 - 99 mg/dL    Comment: Glucose reference range applies only to samples taken after fasting for at least 8 hours.   Comment 1 Notify RN    Comment 2 Document in Chart   CBC     Status: Abnormal   Collection Time: 08/04/21  3:20  AM  Result Value Ref Range   WBC 3.4 (L) 4.0 - 10.5 K/uL   RBC 4.52 4.22 - 5.81 MIL/uL   Hemoglobin 12.9 (L) 13.0 - 17.0 g/dL   HCT 37.9 (L) 39.0 - 52.0 %   MCV 83.8 80.0 - 100.0 fL   MCH 28.5 26.0 - 34.0 pg   MCHC 34.0 30.0 - 36.0 g/dL   RDW 13.9 11.5 - 15.5 %   Platelets 66 (L) 150 - 400 K/uL    Comment: Immature Platelet Fraction may be clinically indicated, consider ordering this additional test [id number] CONSISTENT WITH PREVIOUS RESULT REPEATED TO VERIFY    nRBC 0.0 0.0 - 0.2 %    Comment: Performed at [facility], [place] [address] [place], [place] [address]  Comprehensive metabolic panel     Status: Abnormal   Collection Time: 08/04/21  3:20 AM  Result Value Ref Range   Sodium 140 135 - 145 mmol/L   Potassium 3.1 (L) 3.5 - 5.1 mmol/L   Chloride 106 98 - 111 mmol/L   CO2 26 22 - 32 mmol/L   Glucose, Bld 239 (H) 70 - 99 mg/dL    Comment: Glucose reference range applies only to samples taken after fasting for at least 8 hours.   BUN 6 6 - 20 mg/dL   Creatinine, Ser 0.70 0.61 - 1.24 mg/dL   Calcium 8.6 (L) 8.9 - 10.3 mg/dL   Total Protein 6.3 (L) 6.5 - 8.1 g/dL   Albumin 3.4 (L) 3.5 - 5.0 g/dL   AST 101 (H) 15 - 41 U/L   ALT 74 (  H) 0 - 44 U/L   Alkaline Phosphatase 47 38 - 126 U/L   Total Bilirubin 1.5 (H) 0.3 - 1.2 mg/dL   GFR, Estimated >60 >60 mL/min    Comment: (NOTE) Calculated using the CKD-EPI Creatinine Equation (2021)    Anion gap 8 5 - 15    Comment: Performed at [facility], [place] [address], [place], [place] [address]  Glucose, capillary     Status: Abnormal   Collection Time: 08/04/21  7:32 AM  Result Value Ref Range   Glucose-Capillary 162 (H) 70 - 99 mg/dL    Comment: Glucose reference range applies only to samples taken after fasting for at least 8 hours.  Glucose, capillary     Status: Abnormal   Collection Time: 08/04/21 11:32 AM  Result Value Ref Range   Glucose-Capillary 244 (H) 70 - 99 mg/dL    Comment:  Glucose reference range applies only to samples taken after fasting for at least 8 hours.    Current Facility-Administered Medications  Medication Dose Route Frequency Provider Last Rate Last Admin   acetaminophen (TYLENOL) tablet 650 mg  650 mg Oral Q6H PRN Etta Quill, DO   650 mg at 08/03/21 2119   Or   acetaminophen (TYLENOL) suppository 650 mg  650 mg Rectal Q6H PRN Etta Quill, DO       amLODipine (NORVASC) tablet 10 mg  10 mg Oral Daily Bonnielee Haff, MD   10 mg at 08/04/21 1002   Chlorhexidine Gluconate Cloth 2 % PADS 6 each  6 each Topical Daily Etta Quill, DO   6 each at 08/03/21 1630   dextrose 50 % solution 0-50 mL  0-50 mL Intravenous PRN Blanchie Dessert, MD       feeding supplement (BOOST / RESOURCE BREEZE) liquid 1 Container  1 Container Oral TID BM Bonnielee Haff, MD   1 Container at 41/74/08 1448   folic acid (FOLVITE) tablet 1 mg  1 mg Oral Daily Bonnielee Haff, MD   1 mg at 08/04/21 1002   hydrALAZINE (APRESOLINE) injection 10 mg  10 mg Intravenous Q4H PRN Bonnielee Haff, MD   10 mg at 08/03/21 1654   hydrALAZINE (APRESOLINE) tablet 75 mg  75 mg Oral Q8H Bonnielee Haff, MD       insulin aspart (novoLOG) injection 0-15 Units  0-15 Units Subcutaneous Q4H Bonnielee Haff, MD   5 Units at 08/04/21 1134   insulin glargine-yfgn (SEMGLEE) injection 15 Units  15 Units Subcutaneous Q24H Bonnielee Haff, MD   15 Units at 08/04/21 1003   insulin regular, human (MYXREDLIN) 100 units/ 100 mL infusion   Intravenous Continuous Blanchie Dessert, MD   Stopped at 08/02/21 1325   loperamide (IMODIUM) capsule 4 mg  4 mg Oral TID PRN Bonnielee Haff, MD   4 mg at 08/04/21 0529   LORazepam (ATIVAN) tablet 1-4 mg  1-4 mg Oral Q1H PRN Blanchie Dessert, MD   3 mg at 08/04/21 1135   Or   LORazepam (ATIVAN) injection 1-4 mg  1-4 mg Intravenous Q1H PRN Blanchie Dessert, MD   2 mg at 08/04/21 1856   losartan (COZAAR) tablet 100 mg  100 mg Oral Daily Bonnielee Haff, MD   100 mg  at 08/04/21 1002   multivitamin with minerals tablet 1 tablet  1 tablet Oral Daily Blanchie Dessert, MD   1 tablet at 08/04/21 1002   Oral care mouth rinse  15 mL Mouth Rinse PRN Etta Quill, DO  pantoprazole (PROTONIX) EC tablet 40 mg  40 mg Oral Daily Karren Cobble, RPH   40 mg at 08/04/21 1002   potassium chloride SA (KLOR-CON M) CR tablet 40 mEq  40 mEq Oral TID Samella Parr, NP   40 mEq at 08/04/21 4193   prochlorperazine (COMPAZINE) injection 10 mg  10 mg Intravenous Q4H PRN Bonnielee Haff, MD   10 mg at 08/03/21 2032   thiamine tablet 100 mg  100 mg Oral Daily Blanchie Dessert, MD   100 mg at 08/04/21 1002    Musculoskeletal: Strength & Muscle Tone: within normal limits Gait & Station: unsteady Patient leans: Front   Psychiatric Specialty Exam:  Presentation  General Appearance: Appropriate for Environment  Eye Contact:Good  Speech:Clear and Coherent; Slow  Speech Volume:Decreased  Handedness:Right   Mood and Affect  Mood:Anxious; Dysphoric  Affect:Congruent   Thought Process  Thought Processes:Coherent; Linear  Descriptions of Associations:Intact  Orientation:Full (Time, Place and Person)  Thought Content:Logical; Abstract Reasoning  History of Schizophrenia/Schizoaffective disorder:No data recorded Duration of Psychotic Symptoms:No data recorded Hallucinations:Hallucinations: None  Ideas of Reference:None  Suicidal Thoughts:Suicidal Thoughts: No  Homicidal Thoughts:Homicidal Thoughts: No   Sensorium  Memory:Immediate Good; Recent Good; Remote Good  Judgment:Intact  Insight:Fair   Executive Functions  Concentration:Fair  Attention Span:Good  [place] of Knowledge:Good  Language:Good   Psychomotor Activity  Psychomotor Activity:Psychomotor Activity: Decreased   Assets  Assets:Communication Skills; Desire for Improvement; Social Support   Sleep  Sleep:Sleep: Fair   Physical Exam: Physical  Exam Review of Systems  Psychiatric/Behavioral:  Positive for depression and substance abuse. Negative for hallucinations and suicidal ideas. The patient is not nervous/anxious and does not have insomnia.    Blood pressure (!) 146/93, pulse 94, temperature 98 F (36.7 C), temperature source Oral, resp. rate 19, height '6\' 2"'$  (1.88 m), weight 108.9 kg, SpO2 100 %. Body mass index is 30.81 kg/m.  Treatment Plan Summary: 61 year old male with longer history of mental illness and alcohol use disorder severe who was admitted due to alcohol intoxication(BAL-249) and fall. Based on my evaluation, patient is determined to be depressed but not psychotic, delusional or suicidal. He was non-compliant with mental health medications but now willing to re-start previous medications that worked for him in the past.  Plan/Recommendations: -Continue CIWA and Lorazepam detox protocol - Daily EKG check to monitor QTc interval -Consider Starting patient on Prozac 20 mg daily for depression/alcohol abuse and Gabapentin 300 mg twice daily for mood, anxiety and alcohol abuse once QTc interval is within normal for adult male. -Consider Social worker consult to refer patient to outpatient psychiatrist and alcohol abuse rehabilitation program after patient is medically cleared  Disposition: No evidence of imminent risk to self or others at present.   Patient does not meet criteria for psychiatric inpatient admission. Supportive therapy provided about ongoing stressors. Psychiatric service will follow this patient as needed  Corena Pilgrim, MD 08/04/2021 11:55 AM

## 2021-08-04 NOTE — Progress Notes (Signed)
[facility] PROGRESS NOTE   [name] [name] DOB: 01-Sep-1960 DOA: 08/01/2021  PCP: Camillia Herter, NP  Brief History/Interval Summary: 61 y.o. male with medical history significant of EtOH abuse, DM2, HTN.  Opiate abuse in past previously on suboxone but not any more for at least a month + now.  Accidental OD of seroquel + Loperamide causing torsades in past.  Brought in by family members as he had a mechanical fall after consuming large amounts of water.  Was noted to be intoxicated.  Noted to have anion gap acidosis with elevated glucose levels.  Concern was for DKA.  Noted to have lactic acid level greater than 9 as well.  Hospitalized for further management.   Consultants: Psychiatry  Procedures: None    Subjective/Interval History: Patient mentions that his nausea has improved.  Continues to be very anxious.  Denies any chest pain shortness of breath.  No abdominal pain.     Assessment/Plan:  Lactic acidosis Lactic acid level was noted to be greater than 9 on 3 different occasions.  Etiology unclear.  Probably had something to do with alcohol intake.  Salicylate level was not elevated.   Acetone, ethanol, isopropanol and methanol levels were undetectable.  Ethylene glycol is pending.   Lactic acid level did improve to 2.3.  Osmolality was 288.  No osmolar gap was noted.  Thiamine level is pending.  Alcohol withdrawal syndrome/acute metabolic encephalopathy Patient consumes large amounts of liquor.  He mentioned whiskey but according to H&P he consumes vodka.   Mentation seems to have improved.  Continue CIWA protocol.  Seems to be stable.  Continue multivitamins thiamine folic acid.  Cough Patient mentioned cough yesterday.  Chest x-ray did not show any acute findings.  Noted to be afebrile.  WBC is not elevated.  Continue to monitor for now.  No clear indication for antibiotics.  Elevated lipase Possibly due to nausea.  CT scan did not show any  evidence for pancreatitis.  Do not anticipate any further work-up at this time.    Nausea Probably related to alcoholism.  Continue with as needed antiemetics.  Continue PPI. Seems to be better.  Diabetic gastroparesis could also be contributing.  Since his symptoms are better we will continue to monitor.  If he has recurrence and consideration can be given to giving him metoclopramide.  Diabetes mellitus type 2 with concern for DKA Initially there was concern for DKA due to significantly elevated anion gap.  He was placed on insulin infusion.  CBGs have improved.  Anion gap closed.  Bicarbonate level improved.  Patient was transitioned to subcutaneous insulin.   Continue to monitor CBGs.  HbA1c is 9.4.  History of QT prolongation/history of torsades de points This was in the setting of accidental overdose of Seroquel and loperamide in the past.  Avoid QT prolonging medications.  QTc was noted to be 500 at the time of admission. Continue to correct electrolytes.  Recheck EKG tomorrow.  Transaminitis/alcoholic liver disease Secondary to alcohol.  LFTs are slowly improving.  CT scan of the abdomen pelvis showed mild hepatomegaly with fatty infiltration.  Normal gallbladder.   Hepatitis panel is unremarkable.  Hypokalemia/hypomagnesemia Likely secondary to GI loss.   Potassium level remains low.  Probably secondary to GI loss.  Will be repleted.  Recheck tomorrow.  Recheck magnesium tomorrow.    Acute diarrhea Probably related to alcohol withdrawal.  Abdomen is benign.  Imodium as needed.  Accelerated hypertension Blood pressure slightly better compared to  before but still elevated.  Remains on amlodipine and losartan.  Hydralazine was added yesterday.  We will increase the dose of hydralazine today.  Dose of amlodipine was increased yesterday.    H/o Opioid Addiction Was on suboxone but according to H&P he hasn't taken this in a month. According to [facility] it was last filled on 06/12/21.    Thrombocytopenia Drop in platelet counts noted.  Likely due to alcoholism.  Currently not on any heparin products.   Old labs reviewed.  Platelet counts have been as low as 66,000 back in June.  So there is an element of chronicity. Continue to trend.  No evidence of bleeding.  History of depression  Patient recently evaluated at behavioral health.  According to his son and brother patient has been depressed.  He drinks heavily partly because of this.  We will request psychiatry to evaluate him while he is here in the hospital. Home medication list reviewed.  Noted to be on Zoloft and Seroquel prior to admission.  However due to QT prolongation and these are currently on hold.  Recheck EKG tomorrow.  Obesity Estimated body mass index is 30.81 kg/m as calculated from the following:   Height as of this encounter: '6\' 2"'$  (1.88 m).   Weight as of this encounter: 108.9 kg.   DVT Prophylaxis: SCDs Code Status: Full code Family Communication: Patient's brother [name] Stabs was called and updated yesterday. Disposition Plan: Hopefully return home when improved.  PT and OT evaluation pending.  Status is: Inpatient Remains inpatient appropriate because: Lactic acidosis, alcohol withdrawal, accelerated hypertension     Medications: Scheduled:  amLODipine  10 mg Oral Daily   Chlorhexidine Gluconate Cloth  6 each Topical Daily   feeding supplement  1 Container Oral TID BM   folic acid  1 mg Oral Daily   hydrALAZINE  50 mg Oral Q8H   insulin aspart  0-15 Units Subcutaneous Q4H   insulin glargine-yfgn  15 Units Subcutaneous Q24H   losartan  100 mg Oral Daily   multivitamin with minerals  1 tablet Oral Daily   pantoprazole  40 mg Oral Daily   potassium chloride  40 mEq Oral TID   thiamine  100 mg Oral Daily   Continuous:  sodium chloride Stopped (08/04/21 0153)   insulin Stopped (08/02/21 1325)   JSH:FWYOVZCHYIFOY **OR** acetaminophen, dextrose, hydrALAZINE, loperamide, LORazepam **OR**  LORazepam, mouth rinse, prochlorperazine  Antibiotics: Anti-infectives (From admission, onward)    None       Objective:  Vital Signs  Vitals:   08/04/21 0430 08/04/21 0500 08/04/21 0600 08/04/21 0755  BP: (!) 168/96 (!) 186/92 (!) 162/113   Pulse:  77 73   Resp:  (!) 22 20   Temp:    98 F (36.7 C)  TempSrc:    Oral  SpO2:  95% 99%   Weight:      Height:        Intake/Output Summary (Last 24 hours) at 08/04/2021 0900 Last data filed at 08/04/2021 0800 Gross per 24 hour  Intake 1376.88 ml  Output 3250 ml  Net -1873.12 ml    Filed Weights   08/01/21 2133  Weight: 108.9 kg    General appearance: Awake alert.  In no distress.  Tremulous Resp: Clear to auscultation bilaterally.  Normal effort Cardio: S1-S2 is normal regular.  No S3-S4.  No rubs murmurs or bruit GI: Abdomen is soft.  Nontender nondistended.  Bowel sounds are present normal.  No masses organomegaly Extremities: No edema.  Physical deconditioning is noted. Neurologic: Alert and oriented x3.  No focal neurological deficits.      Lab Results:  Data Reviewed: I have personally reviewed following labs and reports of the imaging studies  CBC: Recent Labs  Lab 08/01/21 2149 08/02/21 0537 08/03/21 0729 08/04/21 0320  WBC 6.2 6.5 4.3 3.4*  NEUTROABS 4.5  --   --   --   HGB 14.8 13.3 13.5 12.9*  HCT 41.8 37.5* 38.9* 37.9*  MCV 80.5 81.0 82.2 83.8  PLT 137* 126* 86* 66*     Basic Metabolic Panel: Recent Labs  Lab 08/02/21 0013 08/02/21 0245 08/02/21 0716 08/02/21 1344 08/03/21 0729 08/04/21 0320  NA 132* 137 139 140 139 140  K 6.6* 2.8* 2.7* 2.8* 3.4* 3.1*  CL 93* 94* 97* 102 104 106  CO2 16* 16* '28 28 23 26  '$ GLUCOSE 377* 218* 135* 127* 240* 239*  BUN '11 11 11 10 6 6  '$ CREATININE 1.00 0.85 0.92 0.86 0.70 0.70  CALCIUM 7.6* 8.1* 8.2* 8.4* 8.4* 8.6*  MG 1.6*  --   --   --  1.9  --      GFR: Estimated Creatinine Clearance: 129 mL/min (by C-G formula based on SCr of 0.7  mg/dL).  Liver Function Tests: Recent Labs  Lab 08/01/21 2149 08/03/21 0729 08/04/21 0320  AST 230* 160* 101*  ALT 126* 94* 74*  ALKPHOS 68 49 47  BILITOT 1.8* 1.7* 1.5*  PROT 7.5 6.5 6.3*  ALBUMIN 4.3 3.5 3.4*     Recent Labs  Lab 08/02/21 0716 08/03/21 0729  LIPASE 167* 174*    Recent Labs  Lab 08/02/21 0245  AMMONIA 26     Coagulation Profile: Recent Labs  Lab 08/02/21 0318  INR 1.2      CBG: Recent Labs  Lab 08/03/21 1600 08/03/21 2026 08/03/21 2333 08/04/21 0319 08/04/21 0732  GLUCAP 221* 190* 165* 225* 162*       Recent Results (from the past 240 hour(s))  MRSA Next Gen by PCR, Nasal     Status: None   Collection Time: 08/02/21  2:44 AM   Specimen: Nasal Mucosa; Nasal Swab  Result Value Ref Range Status   MRSA by PCR Next Gen NOT DETECTED NOT DETECTED Final    Comment: (NOTE) The GeneXpert MRSA Assay (FDA approved for NASAL specimens only), is one component of a comprehensive MRSA colonization surveillance program. It is not intended to diagnose MRSA infection nor to guide or monitor treatment for MRSA infections. Test performance is not FDA approved in patients less than 63 years old. Performed at [facility], [place] [address] [place], [place] [address]       Radiology Studies: CT ABDOMEN PELVIS W CONTRAST  Result Date: 08/03/2021 CLINICAL DATA:  Nausea vomiting. EXAM: CT ABDOMEN AND PELVIS WITH CONTRAST TECHNIQUE: Multidetector CT imaging of the abdomen and pelvis was performed using the standard protocol following bolus administration of intravenous contrast. RADIATION DOSE REDUCTION: This exam was performed according to the departmental dose-optimization program which includes automated exposure control, adjustment of the mA and/or kV according to patient size and/or use of iterative reconstruction technique. CONTRAST:  169m OMNIPAQUE IOHEXOL 300 MG/ML  SOLN COMPARISON:  03/20/2016. FINDINGS: Lower chest: No  acute abnormality. Hepatobiliary: Liver mildly enlarged, 25 cm from superior to inferior. Diffuse decreased attenuation of the liver consistent with fatty infiltration. No liver mass. Normal gallbladder. No bile duct dilation. Pancreas: Unremarkable. No pancreatic ductal dilatation or surrounding inflammatory changes. Spleen: Mildly  enlarged, 16 cm in greatest dimension, unchanged. No splenic mass or focal lesion. Adrenals/Urinary Tract: Adrenal glands are unremarkable. Kidneys are normal, without renal calculi, focal lesion, or hydronephrosis. Bladder is unremarkable. Stomach/Bowel: Normal stomach. Small bowel and colon are normal in caliber. No wall thickening. No inflammation. Sigmoid colon and rectum mostly fluid-filled. Air-fluid levels noted in the right colon. Normal appendix visualized. Vascular/Lymphatic: Aortic atherosclerosis. No aneurysm. Several prominent gastrohepatic ligament lymph nodes. No enlarged abdominal or pelvic lymph nodes. Reproductive: Unremarkable. Other: No abdominal wall hernia or abnormality. No abdominopelvic ascites. Musculoskeletal: No fracture or acute finding.  No bone lesion. IMPRESSION: 1. No acute findings within the abdomen or pelvis. 2. There are colonic air-fluid levels, without wall thickening or adjacent inflammation. This is nonspecific, but can be seen with gastroenteritis/diarrhea. 3. Mild hepatosplenomegaly similar to the prior CT. 4. Diffuse and extensive hepatic steatosis that appears increased in severity from the prior CT. 5. Aortic atherosclerosis. Electronically Signed   By: Lajean Manes M.D.   On: 08/03/2021 15:04   DG CHEST PORT 1 VIEW  Result Date: 08/03/2021 CLINICAL DATA:  Cough.  Osteophytes for accidental overdose. EXAM: PORTABLE CHEST 1 VIEW COMPARISON:  AP chest 07/09/2021 FINDINGS: Mildly decreased lung volumes. Cardiac silhouette and mediastinal contours are within normal limits. The lungs clear. No pleural effusion or pneumothorax. Mild multilevel  degenerative disc changes of the thoracic spine. IMPRESSION: No acute cardiopulmonary disease process. Electronically Signed   By: Yvonne Kendall M.D.   On: 08/03/2021 11:32       LOS: 2 days   [facility] Pager on [email or web address]  08/04/2021, 9:00 AM

## 2021-08-04 NOTE — Evaluation (Addendum)
Occupational Therapy Evaluation Patient Details Name: [name] [name] MRN: [id number] DOB: Oct 30, 1960 Today's Date: 08/04/2021   History of Present Illness patient is a 61 year old male who was admitted with lactic acidosis, alcohol withdrawl syndrome, acute metabolic encephalopathy, acute pancreatitis.   PMH: EtOH abuse, DM II, HTN, h/o accidental OD   Clinical Impression   Patient is a 61 year old male who was admitted for above. Patient reported living at home alone with recent death of mother. Patient was noted to have increased BP during session. Patients blood pressure was 173/96 mmhg at start of session supine in bed. After activity patients BP was 200/117 mmhg. Nurse made aware. Patient was noted to have decreased functional activity tolerance, decreased endurance, decreased standing balance, decreased safety awareness, and decreased knowledge of AD/AE impacting participation in ADLs. Patient would continue to benefit from skilled OT services at this time while admitted and after d/c to address noted deficits in order to improve overall safety and independence in ADLs.       Recommendations for follow up therapy are one component of a multi-disciplinary discharge planning process, led by the attending physician.  Recommendations may be updated based on patient status, additional functional criteria and insurance authorization.   Follow Up Recommendations  Home health OT (pending progress)    Assistance Recommended at Discharge Frequent or constant Supervision/Assistance  Patient can return home with the following A little help with walking and/or transfers;Assistance with cooking/housework;Direct supervision/assist for financial management;Assist for transportation;Direct supervision/assist for medications management;Help with stairs or ramp for entrance;A lot of help with bathing/dressing/bathroom    Functional Status Assessment  Patient has had a recent decline in their functional  status and demonstrates the ability to make significant improvements in function in a reasonable and predictable amount of time.  Equipment Recommendations  Other (comment) (TBD)    Recommendations for Other Services       Precautions / Restrictions Precautions Precautions: Fall Precaution Comments: monitor BP Restrictions Weight Bearing Restrictions: No      Mobility Bed Mobility Overal bed mobility: Needs Assistance Bed Mobility: Supine to Sit     Supine to sit: Mod assist, HOB elevated     General bed mobility comments: with physical assist for BLE to edge of bed and to bring trunk into midline on EOB.    Transfers                          Balance Overall balance assessment: Mild deficits observed, not formally tested                                         ADL either performed or assessed with clinical judgement   ADL                                               Vision         Perception     Praxis      Pertinent Vitals/Pain Pain Assessment Pain Assessment: No/denies pain     Hand Dominance     Extremity/Trunk Assessment Upper Extremity Assessment Upper Extremity Assessment: Generalized weakness   Lower Extremity Assessment Lower Extremity Assessment: Generalized weakness;RLE deficits/detail;LLE deficits/detail RLE Sensation: history of peripheral neuropathy LLE  Sensation: history of peripheral neuropathy   Cervical / Trunk Assessment Cervical / Trunk Assessment: Normal   Communication Communication Communication: No difficulties   Cognition Arousal/Alertness: Awake/alert Behavior During Therapy: WFL for tasks assessed/performed Overall Cognitive Status: Within Functional Limits for tasks assessed                                 General Comments: noted to have some delayed reponses at times. patient reported having lost parents recently.     General Comments        Exercises     Shoulder Instructions      Home Living Family/patient expects to be discharged to:: Private residence Living Arrangements: Alone   Type of Home: House Home Access: Level entry     Home Layout: One level     Bathroom Shower/Tub: Tub/shower unit                    Prior Functioning/Environment Prior Level of Function : Independent/Modified Independent                        OT Problem List: Decreased activity tolerance;Impaired balance (sitting and/or standing);Decreased safety awareness;Decreased knowledge of precautions;Decreased knowledge of use of DME or AE;Cardiopulmonary status limiting activity      OT Treatment/Interventions: Self-care/ADL training;Therapeutic exercise;Neuromuscular education;Energy conservation;DME and/or AE instruction;Therapeutic activities;Balance training;Patient/family education    OT Goals(Current goals can be found in the care plan section) Acute Rehab OT Goals Patient Stated Goal: to get out of bed OT Goal Formulation: With patient Time For Goal Achievement: 08/18/21 Potential to Achieve Goals: Fair  OT Frequency: Min 2X/week    Co-evaluation   Reason for Co-Treatment: For patient/therapist safety;To address functional/ADL transfers PT goals addressed during session: Mobility/safety with mobility OT goals addressed during session: ADL's and self-care      AM-PAC OT "6 Clicks" Daily Activity     Outcome Measure Help from another person eating meals?: A Little Help from another person taking care of personal grooming?: A Little Help from another person toileting, which includes using toliet, bedpan, or urinal?: A Lot Help from another person bathing (including washing, rinsing, drying)?: A Lot Help from another person to put on and taking off regular upper body clothing?: A Little Help from another person to put on and taking off regular lower body clothing?: A Lot 6 Click Score: 15   End of Session  Equipment Utilized During Treatment: Gait belt;Rolling walker (2 wheels) Nurse Communication: Other (comment) (patients increased BP during session)  Activity Tolerance: Patient tolerated treatment well Patient left: in chair;with call bell/phone within reach;with chair alarm set  OT Visit Diagnosis: Unsteadiness on feet (R26.81);Other abnormalities of gait and mobility (R26.89);Muscle weakness (generalized) (M62.81)                Time: 8099-8338 OT Time Calculation (min): 21 min Charges:  OT General Charges $OT Visit: 1 Visit OT Evaluation $OT Eval Moderate Complexity: 1 Mod  Jackelyn Poling OTR/L, MS Acute Rehabilitation Department Office# [phone] Pager# [phone]   Marcellina Millin 08/04/2021, 1:51 PM

## 2021-08-04 NOTE — Progress Notes (Signed)
Initial Nutrition Assessment  DOCUMENTATION CODES:   Not applicable  INTERVENTION:  Continue current diet as ordered by MD, monitor for need to adjust to carb modified. Change boost breeze to Glucerna Shake po TID, each supplement provides 220 kcal and 10 grams of protein Continue MVI, folic acid, and thiamine daily for hx of EtOH abuse  NUTRITION DIAGNOSIS:   Inadequate oral intake related to nausea, decreased appetite as evidenced by per patient/family report.  GOAL:   Patient will meet greater than or equal to 90% of their needs  MONITOR:   PO intake, Supplement acceptance, Labs  REASON FOR ASSESSMENT:   Malnutrition Screening Tool    ASSESSMENT:   Pt with hx of EtOH abuse, DM type 2, HTN, HLD, hx opiate abuse, GERD, anxiety, and depression presented to ED intoxicated and with a recent mechanical fall.  Pt found to be in lactic acidosis on admission and also began having alcohol withdrawals. EtOH intake related to depression, psychiatry consulting.  Pt reported poor appetite and weight loss prior to admission. Also nauseated on admission which has improved today, per pt.   Recorded intake so far appears improved. Weight looks stable x 1 year. Gained weight and then subsequently lost per chart review.   Diet advanced from liquids to GI soft. Glucose elevated, will monitor for need to adjust to carb modified. Will adjust supplements from Boost Breeze to glucerna.   Average Meal Intake: 7/23: 100% intake x 1 recorded meals  Nutritionally Relevant Medications: Scheduled Meds:  Boost Breeze  1 Container Oral TID BM   folic acid  1 mg Oral Daily   insulin aspart  0-15 Units Subcutaneous Q4H   insulin glargine-yfgn  15 Units Subcutaneous Q24H   multivitamin with minerals  1 tablet Oral Daily   pantoprazole  40 mg Oral Daily   potassium chloride  40 mEq Oral TID   thiamine  100 mg Oral Daily   Labs Reviewed: K 3.1 CBG ranges from 166-244 mg/dL over the last 24  hours HgbA1c 9.4%   Lab Results  Component Value Date   ALT 74 (H) 08/04/2021   AST 101 (H) 08/04/2021   ALKPHOS 47 08/04/2021   BILITOT 1.5 (H) 08/04/2021    NUTRITION - FOCUSED PHYSICAL EXAM: Defer to in-person assessment  Diet Order:   Diet Order             DIET SOFT Room service appropriate? Yes; Fluid consistency: Thin  Diet effective now                   EDUCATION NEEDS:   No education needs have been identified at this time  Skin:  Skin Assessment: Reviewed RN Assessment  Last BM:  7/23, type 7  Height:   Ht Readings from Last 1 Encounters:  08/01/21 '6\' 2"'$  (1.88 m)    Weight:   Wt Readings from Last 1 Encounters:  08/01/21 108.9 kg    Ideal Body Weight:  86.4 kg  BMI:  Body mass index is 30.81 kg/m.  Estimated Nutritional Needs:   Kcal:  2300-2500 kcal/d  Protein:  115-130 g/d  Fluid:  >/=2.3L/d    Ranell Patrick, RD, LDN Clinical Dietitian RD pager # available in [facility]  After hours/weekend pager # available in [facility]

## 2021-08-05 DIAGNOSIS — F1019 Alcohol abuse with unspecified alcohol-induced disorder: Secondary | ICD-10-CM | POA: Diagnosis not present

## 2021-08-05 DIAGNOSIS — F10939 Alcohol use, unspecified with withdrawal, unspecified: Secondary | ICD-10-CM | POA: Diagnosis not present

## 2021-08-05 DIAGNOSIS — R7401 Elevation of levels of liver transaminase levels: Secondary | ICD-10-CM | POA: Diagnosis not present

## 2021-08-05 DIAGNOSIS — I10 Essential (primary) hypertension: Secondary | ICD-10-CM | POA: Diagnosis not present

## 2021-08-05 LAB — CBC
HCT: 38.7 % — ABNORMAL LOW (ref 39.0–52.0)
Hemoglobin: 13.3 g/dL (ref 13.0–17.0)
MCH: 28.7 pg (ref 26.0–34.0)
MCHC: 34.4 g/dL (ref 30.0–36.0)
MCV: 83.4 fL (ref 80.0–100.0)
Platelets: 88 10*3/uL — ABNORMAL LOW (ref 150–400)
RBC: 4.64 MIL/uL (ref 4.22–5.81)
RDW: 14 % (ref 11.5–15.5)
WBC: 3.9 10*3/uL — ABNORMAL LOW (ref 4.0–10.5)
nRBC: 0 % (ref 0.0–0.2)

## 2021-08-05 LAB — COMPREHENSIVE METABOLIC PANEL
ALT: 65 U/L — ABNORMAL HIGH (ref 0–44)
AST: 71 U/L — ABNORMAL HIGH (ref 15–41)
Albumin: 3.7 g/dL (ref 3.5–5.0)
Alkaline Phosphatase: 56 U/L (ref 38–126)
Anion gap: 8 (ref 5–15)
BUN: 8 mg/dL (ref 6–20)
CO2: 24 mmol/L (ref 22–32)
Calcium: 9 mg/dL (ref 8.9–10.3)
Chloride: 106 mmol/L (ref 98–111)
Creatinine, Ser: 0.87 mg/dL (ref 0.61–1.24)
GFR, Estimated: >60 mL/min (ref >60)
Glucose, Bld: 180 mg/dL — ABNORMAL HIGH (ref 70–99)
Potassium: 3.6 mmol/L (ref 3.5–5.1)
Sodium: 138 mmol/L (ref 135–145)
Total Bilirubin: 1.6 mg/dL — ABNORMAL HIGH (ref 0.3–1.2)
Total Protein: 6.7 g/dL (ref 6.5–8.1)

## 2021-08-05 LAB — GLUCOSE, CAPILLARY
Glucose-Capillary: 168 mg/dL — ABNORMAL HIGH (ref 70–99)
Glucose-Capillary: 182 mg/dL — ABNORMAL HIGH (ref 70–99)
Glucose-Capillary: 185 mg/dL — ABNORMAL HIGH (ref 70–99)
Glucose-Capillary: 210 mg/dL — ABNORMAL HIGH (ref 70–99)
Glucose-Capillary: 242 mg/dL — ABNORMAL HIGH (ref 70–99)
Glucose-Capillary: 245 mg/dL — ABNORMAL HIGH (ref 70–99)

## 2021-08-05 LAB — MAGNESIUM: Magnesium: 1.9 mg/dL (ref 1.7–2.4)

## 2021-08-05 MED ORDER — LORAZEPAM 2 MG/ML IJ SOLN
1.0000 mg | INTRAMUSCULAR | Status: AC | PRN
  Administered 2021-08-05 – 2021-08-06 (×2): 2 mg via INTRAVENOUS
  Filled 2021-08-05 (×2): qty 1

## 2021-08-05 MED ORDER — LIDOCAINE 5 % EX PTCH
1.0000 | MEDICATED_PATCH | CUTANEOUS | Status: DC
  Administered 2021-08-05: 1 via TRANSDERMAL
  Filled 2021-08-05 (×4): qty 1

## 2021-08-05 MED ORDER — MAGNESIUM SULFATE 2 GM/50ML IV SOLN
2.0000 g | Freq: Once | INTRAVENOUS | Status: AC
Start: 2021-08-05 — End: 2021-08-05
  Administered 2021-08-05: 2 g via INTRAVENOUS
  Filled 2021-08-05: qty 50

## 2021-08-05 MED ORDER — POTASSIUM CHLORIDE CRYS ER 20 MEQ PO TBCR
40.0000 meq | EXTENDED_RELEASE_TABLET | Freq: Once | ORAL | Status: AC
  Administered 2021-08-05: 40 meq via ORAL
  Filled 2021-08-05: qty 2

## 2021-08-05 MED ORDER — LORAZEPAM 1 MG PO TABS
1.0000 mg | ORAL_TABLET | ORAL | Status: AC | PRN
  Administered 2021-08-05 (×2): 2 mg via ORAL
  Administered 2021-08-05: 3 mg via ORAL
  Administered 2021-08-05: 2 mg via ORAL
  Administered 2021-08-05: 3 mg via ORAL
  Administered 2021-08-05: 1 mg via ORAL
  Administered 2021-08-05 – 2021-08-06 (×2): 3 mg via ORAL
  Administered 2021-08-06: 1 mg via ORAL
  Administered 2021-08-06: 2 mg via ORAL
  Administered 2021-08-06: 1 mg via ORAL
  Administered 2021-08-06 (×2): 3 mg via ORAL
  Administered 2021-08-06 (×2): 2 mg via ORAL
  Administered 2021-08-07: 1 mg via ORAL
  Filled 2021-08-05 (×2): qty 3
  Filled 2021-08-05: qty 1
  Filled 2021-08-05 (×3): qty 3
  Filled 2021-08-05: qty 2
  Filled 2021-08-05: qty 1
  Filled 2021-08-05: qty 3
  Filled 2021-08-05: qty 1
  Filled 2021-08-05: qty 2
  Filled 2021-08-05: qty 1
  Filled 2021-08-05 (×4): qty 2

## 2021-08-05 NOTE — Inpatient Diabetes Management (Signed)
Inpatient Diabetes Program Recommendations  AACE/ADA: New Consensus Statement on Inpatient Glycemic Control (2015)  Target Ranges:  Prepandial:   less than 140 mg/dL      Peak postprandial:   less than 180 mg/dL (1-2 hours)      Critically ill patients:  140 - 180 mg/dL   Lab Results  Component Value Date   GLUCAP 182 (H) 08/05/2021   HGBA1C 9.4 (H) 08/03/2021    Review of Glycemic Control  Diabetes history: DM2 Outpatient Diabetes medications: Trulicity 4.08 weekly, metformin 1000 mg BID Current orders for Inpatient glycemic control: Semglee 15 QD, Novolog 0-15 Q4H  185, 242, 182 mg/dL today  Inpatient Diabetes Program Recommendations:    Increase Semglee to 18 units QD Change Novolog to 0-15 TID with meals and 0-5 HS Add Novolog 3 units TID with meals  Pt was recently at [facility] for same. Was discharged home on Lantus 15 units QD although he has not been taking his meds. States he wants to get sober and "this is no way to live." Discussed his diabetes control and HgbA1C of 9.4%. Pt has meter to check blood sugars at home. Discussed impact of nutrition, exercise, stress, sickness, and medications on diabetes control. Talked about blood sugar and HgbA1C goals, and importance of controlling blood sugars to prevent both long and short-term complications. Pt voices understanding. Answered all questions.  Continue to follow.  Thank you. Lorenda Peck, RD, LDN, CDE Inpatient Diabetes Coordinator [phone]

## 2021-08-05 NOTE — Progress Notes (Signed)
[facility] PROGRESS NOTE   [name] [name] BMW:[id number] DOB: May 04, 1960 DOA: 08/01/2021  PCP: Camillia Herter, NP  Brief History/Interval Summary: 61 y.o. male with medical history significant of EtOH abuse, DM2, HTN.  Opiate abuse in past previously on suboxone but not any more for at least a month + now.  Accidental OD of seroquel + Loperamide causing torsades in past.  Brought in by family members as he had a mechanical fall after consuming large amounts of water.  Was noted to be intoxicated.  Noted to have anion gap acidosis with elevated glucose levels.  Concern was for DKA.  Noted to have lactic acid level greater than 9 as well.  Hospitalized for further management.   Consultants: Psychiatry  Procedures: None    Subjective/Interval History: Patient mentions that nausea is much better.  He was able to tolerate diet yesterday.  Tremors have also improved.  Remains fatigued.  Complains of neuropathic pain in his feet.     Assessment/Plan:  Lactic acidosis Lactic acid level was noted to be greater than 9 on 3 different occasions.  Etiology unclear.  Probably had something to do with alcohol intake.  Salicylate level was not elevated.   Acetone, ethanol, isopropanol and methanol levels were undetectable.  Ethylene glycol is pending.   Lactic acid level did improve to 2.3.  Osmolality was 288.  No osmolar gap was noted.  Thiamine level is pending.  Alcohol withdrawal syndrome/acute metabolic encephalopathy Patient consumes large amounts of liquor.  He mentioned whiskey but according to H&P he consumes vodka.   All symptoms appear to be improving.  Continue CIWA protocol.  Continue multivitamins thiamine folic acid.  Cough Patient mentioned cough on 7/22.  Chest x-ray did not show any acute findings.  Noted to be afebrile.  WBC is not elevated.  Continue to monitor for now.  No clear indication for antibiotics. Appears to have improved.  Elevated lipase Possibly  due to nausea.  CT scan did not show any evidence for pancreatitis.  Do not anticipate any further work-up at this time.    Nausea Probably related to alcoholism.  Continue with as needed antiemetics.  Continue PPI. Diabetic gastroparesis could also be contributing.  Since his symptoms are better we will continue to monitor.  If he has recurrence and consideration can be given to giving him metoclopramide. Appears to have subsided.  Diabetes mellitus type 2 with concern for DKA/diabetic neuropathy Initially there was concern for DKA due to significantly elevated anion gap.  He was placed on insulin infusion.  CBGs have improved.  Anion gap closed.  Bicarbonate level improved.  Patient was transitioned to subcutaneous insulin.   Continue to monitor CBGs.  HbA1c is 9.4. Not noted to be on any neuropathic pain medications.  Hopefully can start gabapentin soon.  History of QT prolongation/history of torsades de points This was in the setting of accidental overdose of Seroquel and loperamide in the past.  Avoid QT prolonging medications.  QTc was noted to be 500 at the time of admission. EKG from this morning shows a QTc of 460.  We will replace his potassium and given extra magnesium and recheck her EKG tomorrow.  Transaminitis/alcoholic liver disease Secondary to alcohol.  LFTs are slowly improving.   CT scan of the abdomen pelvis showed mild hepatomegaly with fatty infiltration.  Normal gallbladder.   Hepatitis panel is unremarkable.  Hypokalemia/hypomagnesemia Likely secondary to GI loss.   Continue to replace potassium and magnesium.  Acute diarrhea  Probably related to alcohol withdrawal.  Abdomen is benign.  Imodium as needed. Seems to have improved.  Accelerated hypertension Blood pressure has significantly improved in the last 24 hours.  Continue current regiment including amlodipine, hydralazine and losartan.  Dose adjustments were made to the hydralazine and amlodipine  yesterday.  H/o Opioid Addiction Was on suboxone but according to H&P he hasn't taken this in a month. According to [facility] it was last filled on 06/12/21.   Thrombocytopenia Drop in platelet counts noted.  Likely due to alcoholism.  Currently not on any heparin products.   Old labs reviewed.  Platelet counts have been as low as 66,000 back in June.  So there is an element of chronicity. Counts are stable.  No evidence of bleeding.  History of depression  Patient recently evaluated at behavioral health.  According to his son and brother patient has been depressed.  He drinks heavily partly because of this.   Home medication list reviewed.  Noted to be on Zoloft and Seroquel prior to admission.  However there is also notation that he was not taking these medications. Seen by psychiatry yesterday.  They recommend starting Prozac 20 mg daily and gabapentin 300 mg twice daily once QTc is within normal.  We will wait for QTc to be less than 450.  We will recheck EKG tomorrow.    Obesity Estimated body mass index is 30.81 kg/m as calculated from the following:   Height as of this encounter: '6\' 2"'$  (1.88 m).   Weight as of this encounter: 108.9 kg.   DVT Prophylaxis: SCDs Code Status: Full code Family Communication: Discussed with patient.  Brother updated. Disposition Plan: Seen by PT and OT.  Home health recommended.  He is very fatigued.  Lives by himself.  Hopefully will reach medical stability in 1 to 2 days.  Status is: Inpatient Remains inpatient appropriate because: Lactic acidosis, alcohol withdrawal, accelerated hypertension     Medications: Scheduled:  amLODipine  10 mg Oral Daily   Chlorhexidine Gluconate Cloth  6 each Topical Daily   feeding supplement (GLUCERNA SHAKE)  237 mL Oral TID BM   folic acid  1 mg Oral Daily   hydrALAZINE  75 mg Oral Q8H   insulin aspart  0-15 Units Subcutaneous Q4H   insulin glargine-yfgn  15 Units Subcutaneous Q24H   losartan  100 mg Oral Daily    multivitamin with minerals  1 tablet Oral Daily   pantoprazole  40 mg Oral Daily   thiamine  100 mg Oral Daily   Continuous:  insulin Stopped (08/02/21 1325)   magnesium sulfate bolus IVPB 2 g (08/05/21 0811)   BWL:[id number] **OR** acetaminophen, dextrose, hydrALAZINE, loperamide, LORazepam **OR** LORazepam, mouth rinse, prochlorperazine  Antibiotics: Anti-infectives (From admission, onward)    None       Objective:  Vital Signs  Vitals:   08/05/21 0400 08/05/21 0433 08/05/21 0448 08/05/21 0800  BP: (!) 146/90 (!) 146/90 (!) 155/70   Pulse: 78     Resp: 11     Temp: 98.3 F (36.8 C)   98.3 F (36.8 C)  TempSrc: Oral   Oral  SpO2: 99%     Weight:      Height:        Intake/Output Summary (Last 24 hours) at 08/05/2021 0851 Last data filed at 08/05/2021 0756 Gross per 24 hour  Intake 489.96 ml  Output 5350 ml  Net -4860.04 ml    Filed Weights   08/01/21 2133  Weight: 108.9  kg    General appearance: Awake alert.  In no distress Resp: Clear to auscultation bilaterally.  Normal effort Cardio: S1-S2 is normal regular.  No S3-S4.  No rubs murmurs or bruit GI: Abdomen is soft.  Nontender nondistended.  Bowel sounds are present normal.  No masses organomegaly Extremities: No edema.   Physical deconditioning is noted. Neurologic: Alert and oriented x3.  No focal neurological deficits.     Lab Results:  Data Reviewed: I have personally reviewed following labs and reports of the imaging studies  CBC: Recent Labs  Lab 08/01/21 2149 08/02/21 0537 08/03/21 0729 08/04/21 0320 08/05/21 0306  WBC 6.2 6.5 4.3 3.4* 3.9*  NEUTROABS 4.5  --   --   --   --   HGB 14.8 13.3 13.5 12.9* 13.3  HCT 41.8 37.5* 38.9* 37.9* 38.7*  MCV 80.5 81.0 82.2 83.8 83.4  PLT 137* 126* 86* 66* 88*     Basic Metabolic Panel: Recent Labs  Lab 08/02/21 0013 08/02/21 0245 08/02/21 0716 08/02/21 1344 08/03/21 0729 08/04/21 0320 08/05/21 0306  NA 132*   < > 139 140 139 140  138  K 6.6*   < > 2.7* 2.8* 3.4* 3.1* 3.6  CL 93*   < > 97* 102 104 106 106  CO2 16*   < > '28 28 23 26 24  '$ GLUCOSE 377*   < > 135* 127* 240* 239* 180*  BUN 11   < > '11 10 6 6 8  '$ CREATININE 1.00   < > 0.92 0.86 0.70 0.70 0.87  CALCIUM 7.6*   < > 8.2* 8.4* 8.4* 8.6* 9.0  MG 1.6*  --   --   --  1.9  --  1.9   < > = values in this interval not displayed.     GFR: Estimated Creatinine Clearance: 118.6 mL/min (by C-G formula based on SCr of 0.87 mg/dL).  Liver Function Tests: Recent Labs  Lab 08/01/21 2149 08/03/21 0729 08/04/21 0320 08/05/21 0306  AST 230* 160* 101* 71*  ALT 126* 94* 74* 65*  ALKPHOS 68 49 47 56  BILITOT 1.8* 1.7* 1.5* 1.6*  PROT 7.5 6.5 6.3* 6.7  ALBUMIN 4.3 3.5 3.4* 3.7     Recent Labs  Lab 08/02/21 0716 08/03/21 0729  LIPASE 167* 174*    Recent Labs  Lab 08/02/21 0245  AMMONIA 26     Coagulation Profile: Recent Labs  Lab 08/02/21 0318  INR 1.2      CBG: Recent Labs  Lab 08/04/21 1554 08/04/21 1923 08/04/21 2320 08/05/21 0444 08/05/21 0756  GLUCAP 263* 153* 233* 185* 242*       Recent Results (from the past 240 hour(s))  MRSA Next Gen by PCR, Nasal     Status: None   Collection Time: 08/02/21  2:44 AM   Specimen: Nasal Mucosa; Nasal Swab  Result Value Ref Range Status   MRSA by PCR Next Gen NOT DETECTED NOT DETECTED Final    Comment: (NOTE) The GeneXpert MRSA Assay (FDA approved for NASAL specimens only), is one component of a comprehensive MRSA colonization surveillance program. It is not intended to diagnose MRSA infection nor to guide or monitor treatment for MRSA infections. Test performance is not FDA approved in patients less than 90 years old. Performed at [facility], [place] [address], [place],  [address]       Radiology Studies: CT ABDOMEN PELVIS W CONTRAST  Result Date: 08/03/2021 CLINICAL DATA:  Nausea vomiting. EXAM: CT ABDOMEN  AND PELVIS WITH CONTRAST TECHNIQUE: Multidetector  CT imaging of the abdomen and pelvis was performed using the standard protocol following bolus administration of intravenous contrast. RADIATION DOSE REDUCTION: This exam was performed according to the departmental dose-optimization program which includes automated exposure control, adjustment of the mA and/or kV according to patient size and/or use of iterative reconstruction technique. CONTRAST:  130m OMNIPAQUE IOHEXOL 300 MG/ML  SOLN COMPARISON:  03/20/2016. FINDINGS: Lower chest: No acute abnormality. Hepatobiliary: Liver mildly enlarged, 25 cm from superior to inferior. Diffuse decreased attenuation of the liver consistent with fatty infiltration. No liver mass. Normal gallbladder. No bile duct dilation. Pancreas: Unremarkable. No pancreatic ductal dilatation or surrounding inflammatory changes. Spleen: Mildly enlarged, 16 cm in greatest dimension, unchanged. No splenic mass or focal lesion. Adrenals/Urinary Tract: Adrenal glands are unremarkable. Kidneys are normal, without renal calculi, focal lesion, or hydronephrosis. Bladder is unremarkable. Stomach/Bowel: Normal stomach. Small bowel and colon are normal in caliber. No wall thickening. No inflammation. Sigmoid colon and rectum mostly fluid-filled. Air-fluid levels noted in the right colon. Normal appendix visualized. Vascular/Lymphatic: Aortic atherosclerosis. No aneurysm. Several prominent gastrohepatic ligament lymph nodes. No enlarged abdominal or pelvic lymph nodes. Reproductive: Unremarkable. Other: No abdominal wall hernia or abnormality. No abdominopelvic ascites. Musculoskeletal: No fracture or acute finding.  No bone lesion. IMPRESSION: 1. No acute findings within the abdomen or pelvis. 2. There are colonic air-fluid levels, without wall thickening or adjacent inflammation. This is nonspecific, but can be seen with gastroenteritis/diarrhea. 3. Mild hepatosplenomegaly similar to the prior CT. 4. Diffuse and extensive hepatic steatosis that appears  increased in severity from the prior CT. 5. Aortic atherosclerosis. Electronically Signed   By: DLajean ManesM.D.   On: 08/03/2021 15:04   DG CHEST PORT 1 VIEW  Result Date: 08/03/2021 CLINICAL DATA:  Cough.  Osteophytes for accidental overdose. EXAM: PORTABLE CHEST 1 VIEW COMPARISON:  AP chest 07/09/2021 FINDINGS: Mildly decreased lung volumes. Cardiac silhouette and mediastinal contours are within normal limits. The lungs clear. No pleural effusion or pneumothorax. Mild multilevel degenerative disc changes of the thoracic spine. IMPRESSION: No acute cardiopulmonary disease process. Electronically Signed   By: RYvonne KendallM.D.   On: 08/03/2021 11:32       LOS: 3 days   [facility] Pager on [email or web address]  08/05/2021, 8:51 AM

## 2021-08-05 NOTE — Consult Note (Signed)
Patient continues to meet inpatient psychiatric criteria once medically stable.  -Continue with current recommendations from psychiatric consult service. -Continue current CIWA protocol. -Patient with increased overdose risk score due to active prescription for Librium and buprenorphine.  Recent urine drug screen also positive for benzodiazepines.  Patient will likely benefit from substance abuse residential as he is high risk for unintentional overdose. -Psychiatry consult service will continue to follow until patient is medically cleared and can be referred to inpatient psychiatric facility.

## 2021-08-06 DIAGNOSIS — F1019 Alcohol abuse with unspecified alcohol-induced disorder: Secondary | ICD-10-CM | POA: Diagnosis not present

## 2021-08-06 DIAGNOSIS — F10939 Alcohol use, unspecified with withdrawal, unspecified: Secondary | ICD-10-CM | POA: Diagnosis not present

## 2021-08-06 DIAGNOSIS — I10 Essential (primary) hypertension: Secondary | ICD-10-CM | POA: Diagnosis not present

## 2021-08-06 DIAGNOSIS — R7401 Elevation of levels of liver transaminase levels: Secondary | ICD-10-CM | POA: Diagnosis not present

## 2021-08-06 LAB — CBC
HCT: 39.1 % (ref 39.0–52.0)
Hemoglobin: 13.3 g/dL (ref 13.0–17.0)
MCH: 28.7 pg (ref 26.0–34.0)
MCHC: 34 g/dL (ref 30.0–36.0)
MCV: 84.3 fL (ref 80.0–100.0)
Platelets: 100 10*3/uL — ABNORMAL LOW (ref 150–400)
RBC: 4.64 MIL/uL (ref 4.22–5.81)
RDW: 14.6 % (ref 11.5–15.5)
WBC: 3.9 10*3/uL — ABNORMAL LOW (ref 4.0–10.5)
nRBC: 0 % (ref 0.0–0.2)

## 2021-08-06 LAB — GLUCOSE, CAPILLARY
Glucose-Capillary: 208 mg/dL — ABNORMAL HIGH (ref 70–99)
Glucose-Capillary: 167 mg/dL — ABNORMAL HIGH (ref 70–99)
Glucose-Capillary: 269 mg/dL — ABNORMAL HIGH (ref 70–99)
Glucose-Capillary: 223 mg/dL — ABNORMAL HIGH (ref 70–99)
Glucose-Capillary: 231 mg/dL — ABNORMAL HIGH (ref 70–99)
Glucose-Capillary: 214 mg/dL — ABNORMAL HIGH (ref 70–99)

## 2021-08-06 LAB — COMPREHENSIVE METABOLIC PANEL
ALT: 61 U/L — ABNORMAL HIGH (ref 0–44)
AST: 70 U/L — ABNORMAL HIGH (ref 15–41)
Albumin: 3.7 g/dL (ref 3.5–5.0)
Alkaline Phosphatase: 59 U/L (ref 38–126)
Anion gap: 9 (ref 5–15)
BUN: 11 mg/dL (ref 6–20)
CO2: 25 mmol/L (ref 22–32)
Calcium: 9.4 mg/dL (ref 8.9–10.3)
Chloride: 107 mmol/L (ref 98–111)
Creatinine, Ser: 0.84 mg/dL (ref 0.61–1.24)
GFR, Estimated: >60 mL/min (ref >60)
Glucose, Bld: 216 mg/dL — ABNORMAL HIGH (ref 70–99)
Potassium: 3.5 mmol/L (ref 3.5–5.1)
Sodium: 141 mmol/L (ref 135–145)
Total Bilirubin: 1.4 mg/dL — ABNORMAL HIGH (ref 0.3–1.2)
Total Protein: 6.6 g/dL (ref 6.5–8.1)

## 2021-08-06 LAB — MAGNESIUM: Magnesium: 2.1 mg/dL (ref 1.7–2.4)

## 2021-08-06 MED ORDER — MAGNESIUM OXIDE -MG SUPPLEMENT 400 (240 MG) MG PO TABS
400.0000 mg | ORAL_TABLET | Freq: Every day | ORAL | Status: DC
  Administered 2021-08-06 – 2021-08-08 (×3): 400 mg via ORAL
  Filled 2021-08-06 (×3): qty 1

## 2021-08-06 MED ORDER — POTASSIUM CHLORIDE CRYS ER 20 MEQ PO TBCR
40.0000 meq | EXTENDED_RELEASE_TABLET | Freq: Two times a day (BID) | ORAL | Status: AC
  Administered 2021-08-06 (×2): 40 meq via ORAL
  Filled 2021-08-06 (×2): qty 2

## 2021-08-06 NOTE — Progress Notes (Signed)
CSW received phone call from [facility] requesting to speak to RN. CSW each out to care team: Clarene Critchley, RN, and Darletta Moll, LCSW. CSW awaiting to hear back from care team on RN's contact information.  Benjaman Kindler, MSW, LCSWA 08/06/2021 1:07 PM

## 2021-08-06 NOTE — TOC Initial Note (Addendum)
Transition of Care [facility]) - Initial/Assessment Note    Patient Details  Name: [name] [name] MRN: [id number] Date of Birth: January 10, 1961  Transition of [facility]) CM/SW Contact:    Vassie Moselle, LCSW Phone Number: 08/06/2021, 11:33 AM  Clinical Narrative:                 Per psychiatry pt is recommended for inpatient psychiatric hospitalization. There are currently no available beds at [facility]. Pt has been referred out for appropriate placement.   Update 1320: Pt has been accepted to [facility] and can arrive on 08/07/21 after 9am. Pt will be transported to [facility] by [facility].   Expected Discharge Plan: Psychiatric Hospital Barriers to Discharge: No Barriers Identified   Patient Goals and CMS Choice Patient states their goals for this hospitalization and ongoing recovery are:: To return home      Expected Discharge Plan and Services Expected Discharge Plan: [facility] In-house Referral: Clinical Social Work Discharge Planning Services: CM Consult Post Acute Care Choice: Home Health Living arrangements for the past 2 months: Single Family Home                                      Prior Living Arrangements/Services Living arrangements for the past 2 months: Single Family Home Lives with:: Spouse Patient language and need for interpreter reviewed:: Yes Do you feel safe going back to the place where you live?: Yes      Need for Family Participation in Patient Care: No (Comment) Care giver support system in place?: No (comment)   Criminal Activity/Legal Involvement Pertinent to Current Situation/Hospitalization: No - Comment as needed  Activities of Daily Living Home Assistive Devices/Equipment: Eyeglasses ADL Screening (condition at time of admission) Patient's cognitive ability adequate to safely complete daily activities?: Yes Is the patient deaf or have difficulty hearing?: No Does the patient have difficulty seeing, even when wearing  glasses/contacts?: No Does the patient have difficulty concentrating, remembering, or making decisions?: No Patient able to express need for assistance with ADLs?: Yes Does the patient have difficulty dressing or bathing?: Yes Independently performs ADLs?: No Communication: Independent Dressing (OT): Needs assistance Is this a change from baseline?: Pre-admission baseline Grooming: Needs assistance Is this a change from baseline?: Pre-admission baseline Feeding: Needs assistance Is this a change from baseline?: Pre-admission baseline Bathing: Needs assistance Is this a change from baseline?: Pre-admission baseline Toileting: Needs assistance Is this a change from baseline?: Pre-admission baseline In/Out Bed: Needs assistance Is this a change from baseline?: Pre-admission baseline Walks in Home: Dependent Is this a change from baseline?: Pre-admission baseline Does the patient have difficulty walking or climbing stairs?: Yes Weakness of Legs: Both Weakness of Arms/Hands: Both  Permission Sought/Granted                  Emotional Assessment         Alcohol / Substance Use: Alcohol Use Psych Involvement: Yes (comment)  Admission diagnosis:  Lactic acidosis [E87.20] Fall, initial encounter [W19.XXXA] Rib contusion, right, initial encounter [Z56.387F] Alcoholic intoxication with complication ([place]) [I43.329] Diabetic ketoacidosis without coma associated with type 2 diabetes mellitus ([place]) [E11.10] Patient Active Problem List   Diagnosis Date Noted   Bipolar II disorder ([place]) 08/04/2021   Alcohol abuse with alcohol-induced disorder ([place]) 08/04/2021   Lactic acidosis 08/02/2021   Transaminitis 08/02/2021   Hypokalemia 08/02/2021   Alcohol withdrawal ([place]) 06/30/2021  Alcoholic ketoacidosis 21/19/4174   Diabetic foot infection ([place]) 04/26/2020   Penetrating wound of left foot    Skin lesion 12/07/2019   Chronic pain disorder 09/13/2019   DDD (degenerative disc  disease), lumbar 09/13/2019   Dyslipidemia 09/13/2019   Obesity 09/13/2019   Peripheral neuropathy 09/13/2019   Sleep apnea    Prolonged Q-T interval on ECG 12/03/2016   Drug-induced torsades de pointes ([place]) 12/03/2016   Thrombocytopenia ([place]) 11/29/2016   Personal history of ECMO 11/26/2016   Personal history of spine surgery 11/26/2016   S/P rotator cuff repair 11/26/2016   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 11/13/2016   Mild concentric left ventricular hypertrophy (LVH) 09/11/2016   DJD (degenerative joint disease) of cervical spine 02/26/2016   Tinea pedis of both feet 06/03/2015   Pre-ulcerative calluses 06/01/2015   Type 2 diabetes mellitus with diabetic polyneuropathy ([place]) 06/09/2014   Generalized anxiety disorder 06/19/2012   Herpes zoster 12/05/2008   Mixed hyperlipidemia 05/11/2008   DEPRESSION 05/11/2008   Essential hypertension 05/11/2008   GERD 05/11/2008   OSTEOARTHRITIS 05/11/2008   LOW BACK PAIN 05/11/2008   COLONIC POLYPS, HX OF 05/11/2008   PCP:  Camillia Herter, NP Pharmacy:   [facility] [place], [place] [place] [address] Phone: [phone] Fax: [phone]     Social Determinants of Health (SDOH) Interventions    Readmission Risk Interventions    08/06/2021   11:32 AM  Readmission Risk Prevention Plan  Transportation Screening Complete  PCP or Specialist Appt within 3-5 Days Complete  HRI or [place] Complete  Social Work Consult for [place] Planning/Counseling Complete  Palliative Care Screening Not Applicable  Medication Review Press photographer) Complete

## 2021-08-06 NOTE — Progress Notes (Signed)
Pt was accepted to [facility] 08/07/21; [facility]  Pt meets inpatient criteria per Suella Broad, FNP  Attending Physician will be Dr. Mallie Darting  Report can be called to: [phone]  Pt can arrive after 9:00am  Care Team: Clarene Critchley, RN, and Darletta Moll, LCSW

## 2021-08-06 NOTE — Care Management Important Message (Signed)
Important Message  Patient Details IM Letter given to the Patient Name: GWENDOLYN NISHI MRN: [id number] Date of Birth: [birth year]   Medicare Important Message Given:  Yes     Kerin Salen 08/06/2021, 12:56 PM

## 2021-08-06 NOTE — Progress Notes (Signed)
[facility] PROGRESS NOTE   [name] [name] QQP:[id number] DOB: 10-09-1960 DOA: 08/01/2021  PCP: Camillia Herter, NP  Brief History/Interval Summary: 61 y.o. male with medical history significant of EtOH abuse, DM2, HTN.  Opiate abuse in past previously on suboxone but not any more for at least a month + now.  Accidental OD of seroquel + Loperamide causing torsades in past.  Brought in by family members as he had a mechanical fall after consuming large amounts of water.  Was noted to be intoxicated.  Noted to have anion gap acidosis with elevated glucose levels.  Concern was for DKA.  Noted to have lactic acid level greater than 9 as well.  Hospitalized for further management.  Consultants: Psychiatry  Procedures: None    Subjective/Interval History: Patient continues to have some nausea.  Feels anxious.  At the same time he wants to go home.  Denies any chest pain shortness of breath.     Assessment/Plan:  Alcohol withdrawal syndrome/acute metabolic encephalopathy Patient consumes large amounts of liquor.  He mentioned whiskey but according to H&P he consumes vodka.   Symptoms appear to be improving.  Continue CIWA protocol. Continue multivitamins thiamine folic acid.  Lactic acidosis Lactic acid level was noted to be greater than 9 on 3 different occasions.  Etiology unclear.  Probably had something to do with alcohol intake.  Salicylate level was not elevated.   Acetone, ethanol, isopropanol and methanol levels were undetectable.  Ethylene glycol was less than 5..   Lactic acid level did improve to 2.3.  Osmolality was 288.  No osmolar gap was noted.  Thiamine level is 122.2 which is in the normal range.  Diabetes mellitus type 2 with concern for DKA/diabetic neuropathy Initially there was concern for DKA due to significantly elevated anion gap.  He was placed on insulin infusion.  CBGs have improved.  Anion gap closed.  Bicarbonate level improved.  Patient was  transitioned to subcutaneous insulin.   HbA1c is 9.4.  CBGs have been reasonably well controlled. Not noted to be on any neuropathic pain medications.  Hopefully can start gabapentin soon.  History of QT prolongation/history of torsades de points This was in the setting of accidental overdose of Seroquel and loperamide in the past.  Avoid QT prolonging medications.  QTc was noted to be 500 at the time of admission. QT interval has improved but remains greater than 450.  Continue to aggressively replace potassium which is noted to be 3.5 today.  Magnesium is 2.1.  Recheck EKG tomorrow morning.  Transaminitis/alcoholic liver disease Secondary to alcohol.  LFTs are slowly improving.   CT scan of the abdomen pelvis showed mild hepatomegaly with fatty infiltration.  Normal gallbladder.   Hepatitis panel is unremarkable.  History of depression  Patient recently evaluated at behavioral health.  According to his son and brother patient has been depressed.  He drinks heavily partly because of this.   Home medication list reviewed.  Noted to be on Zoloft and Seroquel prior to admission.  However there is also notation that he was not taking these medications. Seen by psychiatry.  They recommend starting Prozac 20 mg daily and gabapentin 300 mg twice daily once QTc is within normal.  We will wait for QTc to be less than 450.  Psychiatry continues to follow.  It looks like they are considering inpatient psychiatric treatment for this patient.  Cough Patient mentioned cough on 7/22.  Chest x-ray did not show any acute findings.  Noted  to be afebrile.  WBC is not elevated.  Continue to monitor for now.  No clear indication for antibiotics. Appears to have improved.  Elevated lipase Possibly due to nausea.  CT scan did not show any evidence for pancreatitis.  Do not anticipate any further work-up at this time.    Nausea Probably related to alcoholism.  Continue with as needed antiemetics.  Continue  PPI. Diabetic gastroparesis could also be contributing.  Since his symptoms are better we will continue to monitor.  If he has recurrence and consideration can be given to giving him metoclopramide. Appears to have improved.  He is tolerating diet though not eating much.  Hypokalemia/hypomagnesemia Multifactorial etiology for his electrolyte imbalance.  Continue to replace potassium and magnesium as indicated.  Keep levels of potassium greater than 4.0 and magnesium greater than 2.0 due to his history of QT prolongation.  Acute diarrhea Probably related to alcohol withdrawal.  Abdomen is benign.  Imodium as needed. Seems to have improved.  Accelerated hypertension Continue current regiment including amlodipine, hydralazine and losartan.  Dose adjustments were made to the hydralazine and amlodipine.  Blood pressure is much better controlled.  H/o Opioid Addiction Was on suboxone but according to H&P he hasn't taken this in a month. According to [facility] it was last filled on 06/12/21.   Thrombocytopenia Drop in platelet counts noted.  Likely due to alcoholism.  Currently not on any heparin products.   Old labs reviewed.  Platelet counts have been as low as 66,000 back in June.  So there is an element of chronicity. Counts are stable.  No evidence of bleeding.  Obesity Estimated body mass index is 30.81 kg/m as calculated from the following:   Height as of this encounter: '6\' 2"'$  (1.88 m).   Weight as of this encounter: 108.9 kg.   DVT Prophylaxis: SCDs Code Status: Full code Family Communication: Discussed with patient.  Brother updated. Disposition Plan: Seen by PT and OT.  Home health recommended.  He is very fatigued.  Lives by himself.  Hopefully will reach medical stability in 1 to 2 days.  Psychiatry considering inpatient psychiatric treatment.  Status is: Inpatient Remains inpatient appropriate because: Lactic acidosis, alcohol withdrawal, accelerated  hypertension     Medications: Scheduled:  amLODipine  10 mg Oral Daily   feeding supplement (GLUCERNA SHAKE)  237 mL Oral TID BM   folic acid  1 mg Oral Daily   hydrALAZINE  75 mg Oral Q8H   insulin aspart  0-15 Units Subcutaneous Q4H   insulin glargine-yfgn  15 Units Subcutaneous Q24H   lidocaine  1 patch Transdermal Q24H   losartan  100 mg Oral Daily   magnesium oxide  400 mg Oral Daily   multivitamin with minerals  1 tablet Oral Daily   pantoprazole  40 mg Oral Daily   potassium chloride  40 mEq Oral BID   thiamine  100 mg Oral Daily   Continuous:  insulin Stopped (08/02/21 1325)   WIO:[id number] **OR** acetaminophen, dextrose, hydrALAZINE, loperamide, LORazepam **OR** LORazepam, mouth rinse, prochlorperazine  Antibiotics: Anti-infectives (From admission, onward)    None       Objective:  Vital Signs  Vitals:   08/05/21 2043 08/05/21 2058 08/06/21 0410 08/06/21 0730  BP:  136/79 138/74   Pulse:  79 80   Resp:  '18 18 14  '$ Temp: 98.3 F (36.8 C) 97.7 F (36.5 C) 98.3 F (36.8 C)   TempSrc: Oral Oral Oral   SpO2:  100% 98%  Weight:      Height:        Intake/Output Summary (Last 24 hours) at 08/06/2021 0931 Last data filed at 08/06/2021 0426 Gross per 24 hour  Intake 287.93 ml  Output 3100 ml  Net -2812.07 ml    Filed Weights   08/01/21 2133  Weight: 108.9 kg    General appearance: Awake alert.  In no distress.  Anxious Resp: Clear to auscultation bilaterally.  Normal effort Cardio: S1-S2 is normal regular.  No S3-S4.  No rubs murmurs or bruit GI: Abdomen is soft.  Nontender nondistended.  Bowel sounds are present normal.  No masses organomegaly Extremities: No edema.  Moving all of his extremities Neurologic: Alert and oriented x3.  No focal neurological deficits.     Lab Results:  Data Reviewed: I have personally reviewed following labs and reports of the imaging studies  CBC: Recent Labs  Lab 08/01/21 2149 08/02/21 0537  08/03/21 0729 08/04/21 0320 08/05/21 0306 08/06/21 0504  WBC 6.2 6.5 4.3 3.4* 3.9* 3.9*  NEUTROABS 4.5  --   --   --   --   --   HGB 14.8 13.3 13.5 12.9* 13.3 13.3  HCT 41.8 37.5* 38.9* 37.9* 38.7* 39.1  MCV 80.5 81.0 82.2 83.8 83.4 84.3  PLT 137* 126* 86* 66* 88* 100*     Basic Metabolic Panel: Recent Labs  Lab 08/02/21 0013 08/02/21 0245 08/02/21 1344 08/03/21 0729 08/04/21 0320 08/05/21 0306 08/06/21 0504  NA 132*   < > 140 139 140 138 141  K 6.6*   < > 2.8* 3.4* 3.1* 3.6 3.5  CL 93*   < > 102 104 106 106 107  CO2 16*   < > '28 23 26 24 25  '$ GLUCOSE 377*   < > 127* 240* 239* 180* 216*  BUN 11   < > '10 6 6 8 11  '$ CREATININE 1.00   < > 0.86 0.70 0.70 0.87 0.84  CALCIUM 7.6*   < > 8.4* 8.4* 8.6* 9.0 9.4  MG 1.6*  --   --  1.9  --  1.9 2.1   < > = values in this interval not displayed.     GFR: Estimated Creatinine Clearance: 122.9 mL/min (by C-G formula based on SCr of 0.84 mg/dL).  Liver Function Tests: Recent Labs  Lab 08/01/21 2149 08/03/21 0729 08/04/21 0320 08/05/21 0306 08/06/21 0504  AST 230* 160* 101* 71* 70*  ALT 126* 94* 74* 65* 61*  ALKPHOS 68 49 47 56 59  BILITOT 1.8* 1.7* 1.5* 1.6* 1.4*  PROT 7.5 6.5 6.3* 6.7 6.6  ALBUMIN 4.3 3.5 3.4* 3.7 3.7     Recent Labs  Lab 08/02/21 0716 08/03/21 0729  LIPASE 167* 174*    Recent Labs  Lab 08/02/21 0245  AMMONIA 26     Coagulation Profile: Recent Labs  Lab 08/02/21 0318  INR 1.2      CBG: Recent Labs  Lab 08/05/21 1601 08/05/21 1940 08/05/21 2357 08/06/21 0411 08/06/21 0752  GLUCAP 245* 210* 168* 208* 167*       Recent Results (from the past 240 hour(s))  MRSA Next Gen by PCR, Nasal     Status: None   Collection Time: 08/02/21  2:44 AM   Specimen: Nasal Mucosa; Nasal Swab  Result Value Ref Range Status   MRSA by PCR Next Gen NOT DETECTED NOT DETECTED Final    Comment: (NOTE) The GeneXpert MRSA Assay (FDA approved for NASAL specimens only), is one component of  a  comprehensive MRSA colonization surveillance program. It is not intended to diagnose MRSA infection nor to guide or monitor treatment for MRSA infections. Test performance is not FDA approved in patients less than 42 years old. Performed at [facility], [place] [address], [place], [place] [address]       Radiology Studies: No results found.     LOS: 4 days   Samaiyah Howes [employer] on [email or web address]  08/06/2021, 9:31 AM

## 2021-08-06 NOTE — Progress Notes (Signed)
Physical Therapy Treatment Patient Details Name: [name] [name] MRN: [id number] DOB: 1960/10/05 Today's Date: 08/06/2021   History of Present Illness patient is a 61 year old male who was admitted with lactic acidosis, alcohol withdrawl syndrome, acute metabolic encephalopathy, acute pancreatitis.   PMH: EtOH abuse, DM II, HTN, h/o accidental OD    PT Comments    Pt asleep in bed upon entry but agreeable to be seen, reporting no pain but some fatigue, lethargy, and "I just feel so weak;" A&Ox4 and able to identify brother in room after significant delay. Required supervision for bed mobility, min guard for transfers and ambulation in hallway with RW and +2 for recliner follow for safety, distance limited by fatigue. After ambulation, encouraged pt to complete general BLE exercises and pt completed ankle pumps but then complained of anxiety, RN arrived to bring medication, session ended secondary to pt's mental status. Updated equipment recommendation to include RW as pt has had many falls in his recent history, discharge destination remains appropriate. We will continue to follow acutely.    Recommendations for follow up therapy are one component of a multi-disciplinary discharge planning process, led by the attending physician.  Recommendations may be updated based on patient status, additional functional criteria and insurance authorization.  Follow Up Recommendations  Home health PT (dependent on acute stay progress)     Assistance Recommended at Discharge PRN  Patient can return home with the following A little help with walking and/or transfers;A little help with bathing/dressing/bathroom;Assistance with cooking/housework;Assist for transportation;Help with stairs or ramp for entrance   Equipment Recommendations  Rolling walker (2 wheels)    Recommendations for Other Services       Precautions / Restrictions Precautions Precautions: Fall Precaution Comments: monitor BP, hx of  falls Restrictions Weight Bearing Restrictions: No     Mobility  Bed Mobility Overal bed mobility: Needs Assistance Bed Mobility: Supine to Sit     Supine to sit: HOB elevated, Supervision     General bed mobility comments: For safety only, no physical assist required. Pt took seated rest break ~60s after sitting upright prior to scooting EOB.    Transfers Overall transfer level: Needs assistance Equipment used: Rolling walker (2 wheels) Transfers: Sit to/from Stand Sit to Stand: Min guard           General transfer comment: cues for LE management and use of UEs to self assist, no physical assist required.    Ambulation/Gait Ambulation/Gait assistance: +2 safety/equipment, Min guard Gait Distance (Feet): 50 Feet Assistive device: Rolling walker (2 wheels) Gait Pattern/deviations: Step-to pattern, Step-through pattern, Decreased step length - right, Decreased step length - left, Shuffle Gait velocity: decr     General Gait Details: Pt ambulated with RW and min guard +2 for recliner follow for safety only, no physical assist required or overt LOB noted. Pt with shortened step length and shuffling but maintained upright posture.   Stairs             Wheelchair Mobility    Modified Rankin (Stroke Patients Only)       Balance Overall balance assessment: Needs assistance Sitting-balance support: No upper extremity supported, Feet supported Sitting balance-Leahy Scale: Fair     Standing balance support: Bilateral upper extremity supported Standing balance-Leahy Scale: Poor                              Cognition Arousal/Alertness: Lethargic Behavior During Therapy: Flat affect, Anxious  Overall Cognitive Status: Within Functional Limits for tasks assessed                                 General Comments: noted to have some delayed reponses at times. patient reported having lost parents recently. Pt has hx of anxiety         Exercises General Exercises - Lower Extremity Ankle Circles/Pumps: AROM, Both, 10 reps    General Comments        Pertinent Vitals/Pain Pain Assessment Pain Assessment: No/denies pain    Home Living                          Prior Function            PT Goals (current goals can now be found in the care plan section) Acute Rehab PT Goals Patient Stated Goal: REgain IND PT Goal Formulation: With patient Time For Goal Achievement: 08/18/21 Potential to Achieve Goals: Good Progress towards PT goals: Progressing toward goals    Frequency    Min 3X/week      PT Plan Current plan remains appropriate    Co-evaluation              AM-PAC PT "6 Clicks" Mobility   Outcome Measure  Help needed turning from your back to your side while in a flat bed without using bedrails?: None Help needed moving from lying on your back to sitting on the side of a flat bed without using bedrails?: A Little Help needed moving to and from a bed to a chair (including a wheelchair)?: A Little Help needed standing up from a chair using your arms (e.g., wheelchair or bedside chair)?: A Little Help needed to walk in hospital room?: A Little Help needed climbing 3-5 steps with a railing? : A Little 6 Click Score: 19    End of Session Equipment Utilized During Treatment: Gait belt Activity Tolerance: Patient limited by fatigue Patient left: in chair;with call bell/phone within reach;with chair alarm set;with family/visitor present Nurse Communication: Mobility status PT Visit Diagnosis: Unsteadiness on feet (R26.81);Muscle weakness (generalized) (M62.81);Difficulty in walking, not elsewhere classified (R26.2)     Time: 1000-1016 PT Time Calculation (min) (ACUTE ONLY): 16 min  Charges:  $Gait Training: 8-22 mins                    Coolidge Breeze, PT, DPT [facility] Office: [phone] Pager: [phone]   Coolidge Breeze 08/06/2021, 10:24 AM

## 2021-08-07 ENCOUNTER — Inpatient Hospital Stay (HOSPITAL_COMMUNITY): Payer: Medicare Other

## 2021-08-07 DIAGNOSIS — N179 Acute kidney failure, unspecified: Secondary | ICD-10-CM

## 2021-08-07 DIAGNOSIS — F3181 Bipolar II disorder: Secondary | ICD-10-CM

## 2021-08-07 DIAGNOSIS — F411 Generalized anxiety disorder: Secondary | ICD-10-CM

## 2021-08-07 DIAGNOSIS — F10930 Alcohol use, unspecified with withdrawal, uncomplicated: Secondary | ICD-10-CM

## 2021-08-07 LAB — GLUCOSE, CAPILLARY
Glucose-Capillary: 129 mg/dL — ABNORMAL HIGH (ref 70–99)
Glucose-Capillary: 162 mg/dL — ABNORMAL HIGH (ref 70–99)
Glucose-Capillary: 163 mg/dL — ABNORMAL HIGH (ref 70–99)
Glucose-Capillary: 189 mg/dL — ABNORMAL HIGH (ref 70–99)
Glucose-Capillary: 209 mg/dL — ABNORMAL HIGH (ref 70–99)
Glucose-Capillary: 218 mg/dL — ABNORMAL HIGH (ref 70–99)

## 2021-08-07 LAB — BASIC METABOLIC PANEL
Anion gap: 8 (ref 5–15)
BUN: 20 mg/dL (ref 6–20)
CO2: 22 mmol/L (ref 22–32)
Calcium: 9.5 mg/dL (ref 8.9–10.3)
Chloride: 104 mmol/L (ref 98–111)
Creatinine, Ser: 1.12 mg/dL (ref 0.61–1.24)
GFR, Estimated: >60 mL/min (ref >60)
Glucose, Bld: 234 mg/dL — ABNORMAL HIGH (ref 70–99)
Potassium: 4.4 mmol/L (ref 3.5–5.1)
Sodium: 134 mmol/L — ABNORMAL LOW (ref 135–145)

## 2021-08-07 LAB — CREATININE, URINE, RANDOM: Creatinine, Urine: 335 mg/dL

## 2021-08-07 LAB — SODIUM, URINE, RANDOM: Sodium, Ur: 34 mmol/L

## 2021-08-07 LAB — COMPREHENSIVE METABOLIC PANEL
ALT: 64 U/L — ABNORMAL HIGH (ref 0–44)
AST: 68 U/L — ABNORMAL HIGH (ref 15–41)
Albumin: 3.8 g/dL (ref 3.5–5.0)
Alkaline Phosphatase: 62 U/L (ref 38–126)
Anion gap: 8 (ref 5–15)
BUN: 18 mg/dL (ref 6–20)
CO2: 26 mmol/L (ref 22–32)
Calcium: 9.3 mg/dL (ref 8.9–10.3)
Chloride: 103 mmol/L (ref 98–111)
Creatinine, Ser: 1.53 mg/dL — ABNORMAL HIGH (ref 0.61–1.24)
GFR, Estimated: 52 mL/min — ABNORMAL LOW (ref >60)
Glucose, Bld: 157 mg/dL — ABNORMAL HIGH (ref 70–99)
Potassium: 3.6 mmol/L (ref 3.5–5.1)
Sodium: 137 mmol/L (ref 135–145)
Total Bilirubin: 1.3 mg/dL — ABNORMAL HIGH (ref 0.3–1.2)
Total Protein: 6.9 g/dL (ref 6.5–8.1)

## 2021-08-07 LAB — OSMOLALITY, URINE: Osmolality, Ur: 494 mOsm/kg (ref 300–900)

## 2021-08-07 LAB — CBC
HCT: 42.1 % (ref 39.0–52.0)
Hemoglobin: 13.9 g/dL (ref 13.0–17.0)
MCH: 28.4 pg (ref 26.0–34.0)
MCHC: 33 g/dL (ref 30.0–36.0)
MCV: 86.1 fL (ref 80.0–100.0)
Platelets: 133 10*3/uL — ABNORMAL LOW (ref 150–400)
RBC: 4.89 MIL/uL (ref 4.22–5.81)
RDW: 14.9 % (ref 11.5–15.5)
WBC: 5 10*3/uL (ref 4.0–10.5)
nRBC: 0 % (ref 0.0–0.2)

## 2021-08-07 LAB — VITAMIN B1: Vitamin B1 (Thiamine): 122.2 nmol/L (ref 66.5–200.0)

## 2021-08-07 LAB — ETHYLENE GLYCOL: Ethylene Glycol Lvl: <5 mg/dL

## 2021-08-07 MED ORDER — SODIUM CHLORIDE 0.9 % IV BOLUS
1000.0000 mL | Freq: Once | INTRAVENOUS | Status: AC
  Administered 2021-08-07: 1000 mL via INTRAVENOUS

## 2021-08-07 MED ORDER — SODIUM CHLORIDE 0.9 % IV SOLN: INTRAVENOUS | Status: DC

## 2021-08-07 NOTE — Progress Notes (Signed)
Occupational Therapy Treatment Patient Details Name: [name] [name] MRN: [id number] DOB: Oct 26, 1960 Today's Date: 08/07/2021   History of present illness patient is a 61 year old male who was admitted with lactic acidosis, alcohol withdrawl syndrome, acute metabolic encephalopathy, acute pancreatitis.   PMH: EtOH abuse, DM II, HTN, h/o accidental OD   OT comments  Treatment focused on ADLs and functional mobility to improve independence. Patient able to don socks, perform toileting and standing grooming task without overt loss of balance. He was able to ambulate in room and in hall with walker without overt loss of balance .Progressing well with goals. With continued activity while in hospital do not expect patient will need OT at discharge. He is overall min guard to supervision for ADLs. Recommend increasing mobility with nursing and mobility tech to get patient to be more independent in order to discharge to next venue.   Recommendations for follow up therapy are one component of a multi-disciplinary discharge planning process, led by the attending physician.  Recommendations may be updated based on patient status, additional functional criteria and insurance authorization.    Follow Up Recommendations  No OT follow up    Assistance Recommended at Discharge Intermittent Supervision/Assistance  Patient can return home with the following  Assistance with cooking/housework;A little help with bathing/dressing/bathroom;Direct supervision/assist for financial management;Direct supervision/assist for medications management   Equipment Recommendations  None recommended by OT    Recommendations for Other Services      Precautions / Restrictions Precautions Precautions: Fall Precaution Comments: monitor BP, hx of falls Restrictions Weight Bearing Restrictions: No       Mobility Bed Mobility Overal bed mobility: Needs Assistance Bed Mobility: Supine to Sit     Supine to sit: HOB  elevated, Supervision          Transfers Overall transfer level: Needs assistance Equipment used: Rolling walker (2 wheels) Transfers: Sit to/from Stand Sit to Stand: Min guard           General transfer comment: Min guard to walk in hall with RW.     Balance Overall balance assessment: Mild deficits observed, not formally tested                                         ADL either performed or assessed with clinical judgement   ADL Overall ADL's : Needs assistance/impaired Eating/Feeding: Independent   Grooming: Supervision/safety;Standing;Wash/dry hands;Wash/dry face Grooming Details (indicate cue type and reason): at sink Upper Body Bathing: Set up;Sitting   Lower Body Bathing: Supervison/ safety;Sit to/from stand   Upper Body Dressing : Set up;Sitting   Lower Body Dressing: Min guard;Sit to/from stand Lower Body Dressing Details (indicate cue type and reason): able to don socks, ,manage clothing with sit to stand Toilet Transfer: Supervision/safety;Regular Toilet;Grab bars   Toileting- Clothing Manipulation and Hygiene: Supervision/safety;Sit to/from stand       Functional mobility during ADLs: Min guard;Rolling walker (2 wheels) General ADL Comments: Ambulated in room for ADLs with RW and in hall. No overt loss of balance.    Extremity/Trunk Assessment Upper Extremity Assessment Upper Extremity Assessment: Overall WFL for tasks assessed (tremulous)   Lower Extremity Assessment Lower Extremity Assessment: Defer to PT evaluation RLE Sensation: history of peripheral neuropathy LLE Sensation: history of peripheral neuropathy   Cervical / Trunk Assessment Cervical / Trunk Assessment: Normal    Vision Patient Visual Report: No change from baseline  Perception     Praxis      Cognition Arousal/Alertness: Awake/alert Behavior During Therapy: WFL for tasks assessed/performed Overall Cognitive Status: Within Functional Limits for  tasks assessed                                          Exercises      Shoulder Instructions       General Comments      Pertinent Vitals/ Pain       Pain Assessment Pain Assessment: No/denies pain  Home Living                                          Prior Functioning/Environment              Frequency  Min 2X/week        Progress Toward Goals  OT Goals(current goals can now be found in the care plan section)  Progress towards OT goals: Progressing toward goals  Acute Rehab OT Goals Patient Stated Goal: walk more OT Goal Formulation: With patient Time For Goal Achievement: 08/18/21 Potential to Achieve Goals: Good  Plan Discharge plan needs to be updated    Co-evaluation                 AM-PAC OT "6 Clicks" Daily Activity     Outcome Measure   Help from another person eating meals?: None Help from another person taking care of personal grooming?: A Little Help from another person toileting, which includes using toliet, bedpan, or urinal?: A Little Help from another person bathing (including washing, rinsing, drying)?: A Little Help from another person to put on and taking off regular upper body clothing?: A Little Help from another person to put on and taking off regular lower body clothing?: A Little 6 Click Score: 19    End of Session Equipment Utilized During Treatment: Gait belt;Rolling walker (2 wheels)  OT Visit Diagnosis: Unsteadiness on feet (R26.81);Other abnormalities of gait and mobility (R26.89);Muscle weakness (generalized) (M62.81)   Activity Tolerance Patient tolerated treatment well   Patient Left in chair;with call bell/phone within reach;with chair alarm set   Nurse Communication Mobility status        Time: 6789-3810 OT Time Calculation (min): 27 min  Charges: OT General Charges $OT Visit: 1 Visit OT Treatments $Self Care/Home Management : 8-22 mins $Therapeutic Activity: 8-22  mins  Bettyjane Shenoy, OTR/L [place]  Office [phone] Pager: [place] 08/07/2021, 3:22 PM

## 2021-08-07 NOTE — Progress Notes (Signed)
PROGRESS NOTE    [name] [name]  XVQ:[id number] DOB: 07/17/1960 DOA: 08/01/2021 PCP: Camillia Herter, NP   Brief Narrative:  The patient is a 61  y.o. male with medical history significant of EtOH abuse, DM2, HTN.  Opiate abuse in past previously on suboxone but not any more for at least a month + now.  Accidental OD of seroquel + Loperamide causing torsades in past.  Brought in by family members as he had a mechanical fall after consuming large amounts of water.  Was noted to be intoxicated.  Noted to have anion gap acidosis with elevated glucose levels.  Concern was for DKA.  Noted to have lactic acid level greater than 9 as well.  Hospitalized for further management.  He is improving but now developing AKI.  Given IV fluid hydration and if AKI has improved can be discharged to inpatient psychiatric facility at [facility].  Assessment and Plan:    Alcohol withdrawal syndrome/acute metabolic encephalopathy -Patient consumes large amounts of liquor.  He mentioned whiskey but according to H&P he consumes vodka.   -Symptoms appear to be improving.  Continue CIWA protocol. Continue multivitamins thiamine folic acid.   Lactic acidosis -Lactic acid level was noted to be greater than 9 on 3 different occasions.  Etiology unclear.  Probably had something to do with alcohol intake.  Salicylate level was not elevated.   Acetone, ethanol, isopropanol and methanol levels were undetectable.  Ethylene glycol was less than 5..   Lactic acid level did improve to 2.3.  Osmolality was 288.  No osmolar gap was noted.  Thiamine level is 122.2 which is in the normal range.   Diabetes mellitus type 2 with concern for DKA/diabetic neuropathy -Initially there was concern for DKA due to significantly elevated anion gap.  He was placed on insulin infusion.  CBGs have improved.  Anion gap closed.  Bicarbonate level improved.  Patient was transitioned to subcutaneous insulin.   HbA1c is 9.4.  CBGs have been  reasonably well controlled. Not noted to be on any neuropathic pain medications.  Hopefully can start gabapentin soon. -CBGs ranging from 129-189  History of QT prolongation/history of torsades de points -This was in the setting of accidental overdose of Seroquel and loperamide in the past.  Avoid QT prolonging medications.  QTc was noted to be 500 at the time of admission. QT interval has improved but remains greater than 450.  Continue to aggressively replace potassium which is noted to be 3.6 today.  Magnesium is 2.1.  Recheck EKG in the a.m.  AKI -Unclear etiology; patient to be assessed creatinine went from 11/0.84 and is now 18/1.53 -Given 1 bolus of normal saline and then start IV fluid hydration maintenance at 75 MLS per hour -Continue monitor and trend and obtaining urine electrolytes as well as a renal ultrasound -Continue monitor and trend and avoid nephrotoxic medications, contrast dyes, hypotension and dehydration to ensure adequate renal perfusion and will need to renally dose medications -Repeat CMP in the a.m.   Transaminitis/alcoholic liver disease Hyperbilirubinemia -Secondary to alcohol.  LFTs are slowly improving.   -AST is now 68 and ALT is now 64 and T. bili is trended down to 1.3 CT scan of the abdomen pelvis showed mild hepatomegaly with fatty infiltration.  Normal gallbladder.   -Hepatitis panel is unremarkable. -Continue monitor and trend and repeat CMP in a.m.   History of depression  -Patient recently evaluated at behavioral health.  According to his son and brother patient has  been depressed.  He drinks heavily partly because of this.   Home medication list reviewed.  Noted to be on Zoloft and Seroquel prior to admission.  However there is also notation that he was not taking these medications. -Seen by psychiatry.  They recommend starting Prozac 20 mg daily and gabapentin 300 mg twice daily once QTc is within normal.  We will wait for QTc to be less than 450.   Psychiatry continues to follow.  It looks like they are considering inpatient psychiatric treatment for this patien and now recommending inpatient psychiatric hospitalization and have referred the patient to inpatient [facility] [place]   Cough -Patient mentioned cough on 7/22.  Chest x-ray did not show any acute findings.  Noted to be afebrile.  WBC is not elevated.  Continue to monitor for now.  No clear indication for antibiotics. -Appears to have improved.   Elevated lipase Possibly due to nausea.  CT scan did not show any evidence for pancreatitis.  Do not anticipate any further work-up at this time.     Nausea -Probably related to alcoholism.  Continue with as needed antiemetics.  Continue PPI. -Diabetic gastroparesis could also be contributing.  Since his symptoms are better we will continue to monitor.  If he has recurrence and consideration can be given to giving him metoclopramide. Appears to have improved.  He is tolerating diet though not eating much.   Hypokalemia/hypomagnesemia -Multifactorial etiology for his electrolyte imbalance.  Continue to replace potassium and magnesium as indicated.  Keep levels of potassium greater than 4.0 and magnesium greater than 2.0 due to his history of QT prolongation. -Potassium was 3.6 this morning and mag level was not checked so we will check in the morning   Acute diarrhea -Probably related to alcohol withdrawal.  Abdomen is benign.  Imodium as needed. -Seems to have improved.   Accelerated hypertension -Continue current regiment including amlodipine, hydralazine and losartan.  Dose adjustments were made to the hydralazine and amlodipine.  Blood pressure is much better controlled. -Continue to monitor blood pressures per protocol with last blood pressure reading being 131/72   H/o Opioid Addiction -Was on suboxone but according to H&P he hasn't taken this in a month. According to [place] it was last filled on 06/12/21.  -Continue to hold for  now   Thrombocytopenia Drop in platelet counts noted.  Likely due to alcoholism.  Currently not on any heparin products.   Old labs reviewed.  Platelet counts have been as low as 66,000 back in June.  So there is an element of chronicity. Counts are stable.  No evidence of bleeding.  Platelet count is actually improving and has gone from 88,000 is not 133,000 -Continue to monitor and repeat CBC in a.m.  Obesity -Complicates overall prognosis and care -Estimated body mass index is 30.81 kg/m as calculated from the following:   Height as of this encounter: '6\' 2"'$  (1.88 m).   Weight as of this encounter: 108.9 kg.  -Weight Loss and Dietary Counseling given   DVT prophylaxis: SCDs Start: 08/02/21 0508    Code Status: Full Code Family Communication: No family currently at bedside  Disposition Plan:  Level of care: Telemetry Status is: Inpatient Remains inpatient appropriate because: Now has an AKI and will go to inpatient [facility] once stable   Consultants:  Psychiatry PCCM transfer  Procedures:  Renal ultrasound  Antimicrobials:  Anti-infectives (From admission, onward)    None       Subjective: Seen and examined  at bedside and he thinks he is doing okay states he slept fairly well.  Had some shakes this morning.  No chest pain or lightheadedness or dizziness.  No other concerns or close at this time.  Objective: Vitals:   08/06/21 1301 08/06/21 1945 08/07/21 0328 08/07/21 1308  BP: 122/68 126/70 133/76 131/72  Pulse: 92 80 66 78  Resp: '20 16 16 16  '$ Temp: 99 F (37.2 C) 98.3 F (36.8 C) 97.6 F (36.4 C) 98.5 F (36.9 C)  TempSrc: Oral Oral Oral Oral  SpO2: 98% 99% 100% 98%  Weight:      Height:        Intake/Output Summary (Last 24 hours) at 08/07/2021 1856 Last data filed at 08/07/2021 1700 Gross per 24 hour  Intake 360 ml  Output 350 ml  Net 10 ml   Filed Weights   08/01/21 2133  Weight: 108.9 kg   Examination: Physical Exam:  Constitutional: WN/WD  obese Caucasian male who is slightly tremulous Respiratory: Diminished to auscultation bilaterally, no wheezing, rales, rhonchi or crackles. Normal respiratory effort and patient is not tachypenic. No accessory muscle use.  Unlabored breathing Cardiovascular: RRR, no murmurs / rubs / gallops. S1 and S2 auscultated. No extremity edema.  Abdomen: Soft, non-tender, distended secondary body habitus. Bowel sounds positive.  GU: Deferred. Musculoskeletal: No clubbing / cyanosis of digits/nails. No joint deformity upper and lower extremities.  Skin: No rashes, lesions, ulcers on limited skin evaluation. No induration; Warm and dry.  Neurologic: CN 2-12 grossly intact with no focal deficits but he did have some tremors noted. Romberg sign and cerebellar reflexes not assessed.  Psychiatric: Normal judgment and insight. Alert and oriented x 3. Normal mood and appropriate affect.   Data Reviewed: I have personally reviewed following labs and imaging studies  CBC: Recent Labs  Lab 08/01/21 2149 08/02/21 0537 08/03/21 0729 08/04/21 0320 08/05/21 0306 08/06/21 0504 08/07/21 0539  WBC 6.2   < > 4.3 3.4* 3.9* 3.9* 5.0  NEUTROABS 4.5  --   --   --   --   --   --   HGB 14.8   < > 13.5 12.9* 13.3 13.3 13.9  HCT 41.8   < > 38.9* 37.9* 38.7* 39.1 42.1  MCV 80.5   < > 82.2 83.8 83.4 84.3 86.1  PLT 137*   < > 86* 66* 88* 100* 133*   < > = values in this interval not displayed.   Basic Metabolic Panel: Recent Labs  Lab 08/02/21 0013 08/02/21 0245 08/03/21 0729 08/04/21 0320 08/05/21 0306 08/06/21 0504 08/07/21 0539  NA 132*   < > 139 140 138 141 137  K 6.6*   < > 3.4* 3.1* 3.6 3.5 3.6  CL 93*   < > 104 106 106 107 103  CO2 16*   < > '23 26 24 25 26  '$ GLUCOSE 377*   < > 240* 239* 180* 216* 157*  BUN 11   < > '6 6 8 11 18  '$ CREATININE 1.00   < > 0.70 0.70 0.87 0.84 1.53*  CALCIUM 7.6*   < > 8.4* 8.6* 9.0 9.4 9.3  MG 1.6*  --  1.9  --  1.9 2.1  --    < > = values in this interval not displayed.    GFR: Estimated Creatinine Clearance: 67.5 mL/min (A) (by C-G formula based on SCr of 1.53 mg/dL (H)). Liver Function Tests: Recent Labs  Lab 08/03/21 7939 08/04/21 0320 08/05/21 0306 08/06/21 0300  08/07/21 0539  AST 160* 101* 71* 70* 68*  ALT 94* 74* 65* 61* 64*  ALKPHOS 49 47 56 59 62  BILITOT 1.7* 1.5* 1.6* 1.4* 1.3*  PROT 6.5 6.3* 6.7 6.6 6.9  ALBUMIN 3.5 3.4* 3.7 3.7 3.8   Recent Labs  Lab 08/02/21 0716 08/03/21 0729  LIPASE 167* 174*   Recent Labs  Lab 08/02/21 0245  AMMONIA 26   Coagulation Profile: Recent Labs  Lab 08/02/21 0318  INR 1.2   Cardiac Enzymes: No results for input(s): "CKTOTAL", "CKMB", "CKMBINDEX", "TROPONINI" in the last 168 hours. BNP (last 3 results) No results for input(s): "PROBNP" in the last 8760 hours. HbA1C: No results for input(s): "HGBA1C" in the last 72 hours. CBG: Recent Labs  Lab 08/06/21 2319 08/07/21 0331 08/07/21 0722 08/07/21 1135 08/07/21 1547  GLUCAP 214* 162* 129* 163* 189*   Lipid Profile: No results for input(s): "CHOL", "HDL", "LDLCALC", "TRIG", "CHOLHDL", "LDLDIRECT" in the last 72 hours. Thyroid Function Tests: No results for input(s): "TSH", "T4TOTAL", "FREET4", "T3FREE", "THYROIDAB" in the last 72 hours. Anemia Panel: No results for input(s): "VITAMINB12", "FOLATE", "FERRITIN", "TIBC", "IRON", "RETICCTPCT" in the last 72 hours. Sepsis Labs: Recent Labs  Lab 08/02/21 0013 08/02/21 0245 08/02/21 0537 08/02/21 1344  LATICACIDVEN >9.0* >9.0* 6.6* 2.3*    Recent Results (from the past 240 hour(s))  MRSA Next Gen by PCR, Nasal     Status: None   Collection Time: 08/02/21  2:44 AM   Specimen: Nasal Mucosa; Nasal Swab  Result Value Ref Range Status   MRSA by PCR Next Gen NOT DETECTED NOT DETECTED Final    Comment: (NOTE) The GeneXpert MRSA Assay (FDA approved for NASAL specimens only), is one component of a comprehensive MRSA colonization surveillance program. It is not intended to diagnose MRSA  infection nor to guide or monitor treatment for MRSA infections. Test performance is not FDA approved in patients less than 75 years old. Performed at [facility], [place] [address], [place], [place] [address]     Radiology Studies: US RENAL  Result Date: 08/07/2021 CLINICAL DATA:  Acute kidney injury EXAM: RENAL / URINARY TRACT ULTRASOUND COMPLETE COMPARISON:  08/03/2021 FINDINGS: Right Kidney: Renal measurements: 11.7 by 4.1 by 6.0 cm = volume: 149 mL. Echogenicity within normal limits. No mass or hydronephrosis visualized. Left Kidney: Renal measurements: 13.7 by 4.8 by 5.5 cm = volume: 187 mL. Echogenicity within normal limits. No mass or hydronephrosis visualized. Bladder: Appears normal for degree of bladder distention. Other: Incidental imaging of the liver reveals coarse echogenic liver with poor sonic penetration compatible with diffuse hepatic steatosis. IMPRESSION: 1. No hydronephrosis, hydroureter, or renal lesion identified. 2. Coarse echogenic liver with poor sonic penetration compatible with diffuse hepatic steatosis. Electronically Signed   By: Van Clines M.D.   On: 08/07/2021 11:47    Scheduled Meds:  amLODipine  10 mg Oral Daily   feeding supplement (GLUCERNA SHAKE)  237 mL Oral TID BM   folic acid  1 mg Oral Daily   hydrALAZINE  75 mg Oral Q8H   insulin aspart  0-15 Units Subcutaneous Q4H   insulin glargine-yfgn  15 Units Subcutaneous Q24H   lidocaine  1 patch Transdermal Q24H   magnesium oxide  400 mg Oral Daily   multivitamin with minerals  1 tablet Oral Daily   pantoprazole  40 mg Oral Daily   thiamine  100 mg Oral Daily   Continuous Infusions:  sodium chloride 75 mL/hr at 08/07/21 0843   insulin Stopped (  08/02/21 1325)    LOS: 5 days   Raiford Noble, DO [facility] Available via Epic secure chat 7am-7pm After these hours, please refer to coverage provider listed on [email or web address] 08/07/2021, 6:56 PM

## 2021-08-08 DIAGNOSIS — D61818 Other pancytopenia: Secondary | ICD-10-CM

## 2021-08-08 LAB — CBC WITH DIFFERENTIAL/PLATELET
Abs Immature Granulocytes: 0.04 10*3/uL (ref 0.00–0.07)
Basophils Absolute: 0.1 10*3/uL (ref 0.0–0.1)
Basophils Relative: 1 %
Eosinophils Absolute: 0.2 10*3/uL (ref 0.0–0.5)
Eosinophils Relative: 5 %
HCT: 35.4 % — ABNORMAL LOW (ref 39.0–52.0)
Hemoglobin: 11.8 g/dL — ABNORMAL LOW (ref 13.0–17.0)
Immature Granulocytes: 1 %
Lymphocytes Relative: 25 %
Lymphs Abs: 0.9 10*3/uL (ref 0.7–4.0)
MCH: 29.1 pg (ref 26.0–34.0)
MCHC: 33.3 g/dL (ref 30.0–36.0)
MCV: 87.2 fL (ref 80.0–100.0)
Monocytes Absolute: 0.7 10*3/uL (ref 0.1–1.0)
Monocytes Relative: 20 %
Neutro Abs: 1.8 10*3/uL (ref 1.7–7.7)
Neutrophils Relative %: 48 %
Platelets: 119 10*3/uL — ABNORMAL LOW (ref 150–400)
RBC: 4.06 MIL/uL — ABNORMAL LOW (ref 4.22–5.81)
RDW: 14.8 % (ref 11.5–15.5)
WBC: 3.7 10*3/uL — ABNORMAL LOW (ref 4.0–10.5)
nRBC: 0 % (ref 0.0–0.2)

## 2021-08-08 LAB — COMPREHENSIVE METABOLIC PANEL
ALT: 65 U/L — ABNORMAL HIGH (ref 0–44)
AST: 72 U/L — ABNORMAL HIGH (ref 15–41)
Albumin: 3.3 g/dL — ABNORMAL LOW (ref 3.5–5.0)
Alkaline Phosphatase: 57 U/L (ref 38–126)
Anion gap: 8 (ref 5–15)
BUN: 19 mg/dL (ref 6–20)
CO2: 22 mmol/L (ref 22–32)
Calcium: 8.8 mg/dL — ABNORMAL LOW (ref 8.9–10.3)
Chloride: 106 mmol/L (ref 98–111)
Creatinine, Ser: 1.06 mg/dL (ref 0.61–1.24)
GFR, Estimated: >60 mL/min (ref >60)
Glucose, Bld: 186 mg/dL — ABNORMAL HIGH (ref 70–99)
Potassium: 3.7 mmol/L (ref 3.5–5.1)
Sodium: 136 mmol/L (ref 135–145)
Total Bilirubin: 1 mg/dL (ref 0.3–1.2)
Total Protein: 6 g/dL — ABNORMAL LOW (ref 6.5–8.1)

## 2021-08-08 LAB — GLUCOSE, CAPILLARY
Glucose-Capillary: 120 mg/dL — ABNORMAL HIGH (ref 70–99)
Glucose-Capillary: 164 mg/dL — ABNORMAL HIGH (ref 70–99)
Glucose-Capillary: 282 mg/dL — ABNORMAL HIGH (ref 70–99)

## 2021-08-08 LAB — PHOSPHORUS: Phosphorus: 4.7 mg/dL — ABNORMAL HIGH (ref 2.5–4.6)

## 2021-08-08 LAB — MAGNESIUM: Magnesium: 1.9 mg/dL (ref 1.7–2.4)

## 2021-08-08 MED ORDER — HYDRALAZINE HCL 25 MG PO TABS: 75.0000 mg | ORAL_TABLET | Freq: Three times a day (TID) | ORAL | Status: DC

## 2021-08-08 MED ORDER — FLUOXETINE HCL 20 MG PO CAPS: 20.0000 mg | ORAL_CAPSULE | Freq: Every day | ORAL | Status: DC

## 2021-08-08 MED ORDER — THIAMINE HCL 100 MG PO TABS: 100.0000 mg | ORAL_TABLET | Freq: Every day | ORAL | Status: DC

## 2021-08-08 MED ORDER — LOPERAMIDE HCL 2 MG PO CAPS: 4.0000 mg | ORAL_CAPSULE | Freq: Three times a day (TID) | ORAL | 0 refills | Status: DC | PRN

## 2021-08-08 MED ORDER — INSULIN GLARGINE-YFGN 100 UNIT/ML ~~LOC~~ SOLN: 15.0000 [IU] | SUBCUTANEOUS | 11 refills | Status: DC

## 2021-08-08 MED ORDER — FOLIC ACID 1 MG PO TABS: 1.0000 mg | ORAL_TABLET | Freq: Every day | ORAL | Status: DC

## 2021-08-08 MED ORDER — GABAPENTIN 300 MG PO CAPS: 300.0000 mg | ORAL_CAPSULE | Freq: Two times a day (BID) | ORAL | 2 refills | Status: DC

## 2021-08-08 MED ORDER — FLUOXETINE HCL 20 MG PO CAPS: 20.0000 mg | ORAL_CAPSULE | Freq: Every day | ORAL | 3 refills | Status: DC

## 2021-08-08 MED ORDER — PANTOPRAZOLE SODIUM 40 MG PO TBEC: 40.0000 mg | DELAYED_RELEASE_TABLET | Freq: Every day | ORAL | Status: DC

## 2021-08-08 MED ORDER — PEN NEEDLES 32G X 4 MM MISC: 1.0000 | Freq: Three times a day (TID) | 0 refills | Status: AC

## 2021-08-08 MED ORDER — GLUCERNA SHAKE PO LIQD: 237.0000 mL | Freq: Three times a day (TID) | ORAL | 0 refills | Status: DC

## 2021-08-08 MED ORDER — MAGNESIUM OXIDE -MG SUPPLEMENT 400 (240 MG) MG PO TABS: 400.0000 mg | ORAL_TABLET | Freq: Every day | ORAL | Status: DC

## 2021-08-08 MED ORDER — GABAPENTIN 300 MG PO CAPS
300.0000 mg | ORAL_CAPSULE | Freq: Two times a day (BID) | ORAL | Status: DC
Start: 2021-08-08 — End: 2021-08-08

## 2021-08-08 MED ORDER — GABAPENTIN 300 MG PO CAPS: 300.0000 mg | ORAL_CAPSULE | Freq: Two times a day (BID) | ORAL | Status: DC

## 2021-08-08 MED ORDER — LIDOCAINE 5 % EX PTCH: 1.0000 | MEDICATED_PATCH | CUTANEOUS | 0 refills | Status: DC

## 2021-08-08 MED ORDER — ADULT MULTIVITAMIN W/MINERALS CH: 1.0000 | ORAL_TABLET | Freq: Every day | ORAL | Status: DC

## 2021-08-08 MED ORDER — AMLODIPINE BESYLATE 10 MG PO TABS: 10.0000 mg | ORAL_TABLET | Freq: Every day | ORAL | Status: DC

## 2021-08-08 NOTE — Consult Note (Signed)
   [facility] Inpatient Consult   08/08/2021  [name] [name] [birth year] [id number]  [facility] [ACO] Patient: [name]  *Remote review coverage for [facility]  Primary Care Provider:  Camillia Herter, NP, Behavior Health   Patient screened for hospitalization with noted extreme high risk score for unplanned readmission risk and to assess for potential [facility] service needs for post hospital transition.  Review of patient's medical record reveals patient is being transferred to a psychiatric hospital   Plan:  Readmission review .    For questions contact:   Natividad Brood, RN BSN [facility] Liaison  [phone] business mobile phone Toll free office [phone]  Fax number: [phone] [email or web address]

## 2021-08-08 NOTE — Progress Notes (Signed)
Physical Therapy Treatment Patient Details Name: [name] [name] MRN: [id number] DOB: [birth year] Today's Date: 08/08/2021   History of Present Illness patient is a 61 year old male who was admitted with lactic acidosis, alcohol withdrawl syndrome, acute metabolic encephalopathy, acute pancreatitis.   PMH: EtOH abuse, DM II, HTN, h/o accidental OD   PT Comments    Pt supine in bed, alert and oriented x4 and ready for therapy. Demonstrated modified independence for bed mobility, supervision for transfers for safety only, and modified independence for ambulation in hallway 544f with IV pole and then no AD. [facility] and to resume regular podiatry visits secondary to diabetic peripheral neuropathy, pt verbalized understanding. Pt would benefit from mobility team assisting with additional opportunities for ambulation. Pt was concerned about being discharged to a psychiatric hospital citing "I've got bills to pay and things to do and I've already detoxed here," requesting to see a psychiatrist, RN notified. Pt does demonstrate a fall risk secondary to peripheral neuropathy and history of falls so would benefit from HHPT to address these functional limitations. We will continue to follow acutely.    Recommendations for follow up therapy are one component of a multi-disciplinary discharge planning process, led by the attending physician.  Recommendations may be updated based on patient status, additional functional criteria and insurance authorization.  Follow Up Recommendations  Home health PT     Assistance Recommended at Discharge Set up Supervision/Assistance  Patient can return home with the following A little help with walking and/or transfers;A little help with bathing/dressing/bathroom;Assistance with cooking/housework;Assist for transportation;Help with stairs or ramp for entrance   Equipment Recommendations  Single point cane ([facility]    Recommendations for Other Services       Precautions / Restrictions Precautions Precautions: Fall Precaution Comments: monitor BP, hx of falls Restrictions Weight Bearing Restrictions: No     Mobility  Bed Mobility Overal bed mobility: Needs Assistance, Modified Independent Bed Mobility: Supine to Sit     Supine to sit: HOB elevated, Modified independent (Device/Increase time)     General bed mobility comments: Increased time    Transfers Overall transfer level: Needs assistance Equipment used: None Transfers: Sit to/from Stand Sit to Stand: Supervision           General transfer comment: Supervision for safety    Ambulation/Gait Ambulation/Gait assistance: Modified independent (Device/Increase time) Gait Distance (Feet): 500 Feet Assistive device: IV Pole, None Gait Pattern/deviations: Step-to pattern, Step-through pattern, Decreased step length - right, Decreased step length - left Gait velocity: decr     General Gait Details: Pt ambulated 505fbegan with IV pole then after ~6063fransitioned to no AD, minor LOB where pt's feet collided with each other but was able to self-correct, pt reports it was because of his periperal neuropathy. Otherwise, pt steady and modified independent for use of IV pole   Stairs             Wheelchair Mobility    Modified Rankin (Stroke Patients Only)       Balance Overall balance assessment: Mild deficits observed, not formally tested Sitting-balance support: No upper extremity supported, Feet supported Sitting balance-Leahy Scale: Fair     Standing balance support: Single extremity supported, During functional activity Standing balance-Leahy Scale: Fair                              Cognition Arousal/Alertness: Awake/alert Behavior During  Therapy: WFL for tasks assessed/performed Overall Cognitive Status: Within Functional Limits for tasks assessed                                           Exercises General Exercises - Lower Extremity Ankle Circles/Pumps: AROM, Both, 10 reps    General Comments        Pertinent Vitals/Pain Pain Assessment Pain Assessment: No/denies pain    Home Living                          Prior Function            PT Goals (current goals can now be found in the care plan section) Acute Rehab PT Goals Patient Stated Goal: REgain IND PT Goal Formulation: With patient Time For Goal Achievement: 08/18/21 Potential to Achieve Goals: Good Progress towards PT goals: Progressing toward goals    Frequency    Min 3X/week      PT Plan Current plan remains appropriate    Co-evaluation              AM-PAC PT "6 Clicks" Mobility   Outcome Measure  Help needed turning from your back to your side while in a flat bed without using bedrails?: None Help needed moving from lying on your back to sitting on the side of a flat bed without using bedrails?: A Little Help needed moving to and from a bed to a chair (including a wheelchair)?: A Little Help needed standing up from a chair using your arms (e.g., wheelchair or bedside chair)?: A Little Help needed to walk in hospital room?: A Little Help needed climbing 3-5 steps with a railing? : A Little 6 Click Score: 19    End of Session   Activity Tolerance: Patient tolerated treatment well;No increased pain Patient left: in chair;with call bell/phone within reach;with chair alarm set Nurse Communication: Mobility status PT Visit Diagnosis: Unsteadiness on feet (R26.81);Muscle weakness (generalized) (M62.81);Difficulty in walking, not elsewhere classified (R26.2)     Time: 5053-9767 PT Time Calculation (min) (ACUTE ONLY): 16 min  Charges:  $Gait Training: 8-22 mins                    Coolidge Breeze, PT, DPT [facility] Department Office: [phone] Pager: [phone]   Coolidge Breeze 08/08/2021, 11:05 AM

## 2021-08-08 NOTE — Progress Notes (Signed)
[employer] called and informed of IVC paperwork needing to be served and transport required. [employer] informed RN that they serve paperwork but do not transport. Sheriff transport number is [phone]. [employer] will dispatch unit as soon as one available.

## 2021-08-08 NOTE — TOC Transition Note (Addendum)
Transition of Care [facility]) - CM/SW Discharge Note   Patient Details  Name: [name] [name] MRN: [id number] Date of Birth: 09/27/1960  [facility] CM/SW Contact:  Vassie Moselle, LCSW Phone Number: 08/08/2021, 12:20 PM   Clinical Narrative:    Pt declining to transfer to [facility] psychiatric hospital voluntarily. Psychiatry team recommend pt be IVC'd due to high risk for unintentional overdose. IVC has been completed and faxed to the [facility] office.   Update 1240: Pt now under IVC status. Pt is to transfer to [facility] and can be transported by [facility]. IVC has been faxed to [facility].    Final next level of care: Psychiatric Hospital Barriers to Discharge: No Barriers Identified   Patient Goals and CMS Choice Patient states their goals for this hospitalization and ongoing recovery are:: To return home      Discharge Placement                       Discharge Plan and Services In-house Referral: Clinical Social Work Discharge Planning Services: CM Consult Post Acute Care Choice: Home Health          DME Arranged: N/A DME Agency: NA                  Social Determinants of Health (SDOH) Interventions     Readmission Risk Interventions    08/06/2021   11:32 AM  Readmission Risk Prevention Plan  Transportation Screening Complete  PCP or Specialist Appt within 3-5 Days Complete  HRI or [place] Complete  Social Work Consult for [place] Planning/Counseling Complete  Palliative Care Screening Not Applicable  Medication Review [employer]) Complete

## 2021-08-08 NOTE — Discharge Summary (Addendum)
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)  Resolved Problems:   * No resolved hospital problems. [facility] Hospital Course: The patient is a 61  y.o. male with medical history significant of EtOH abuse, DM2, HTN.  Opiate abuse in past previously on suboxone but not any more for at least a month + now.  Has had Accidental OD of seroquel + Loperamide causing torsades in past.  Brought in by family members as he had a mechanical fall after consuming large amounts of liquor.  Was noted to be intoxicated.  Noted to have anion gap acidosis with elevated glucose levels.  Concern was for DKA.  Noted to have lactic acid level greater than 9 as well.  Hospitalized for further management.  He subsequently improved but prior to D/C developed AKI so was Given IV fluid hydration. Now AKI has improved can be discharged to inpatient psychiatric facility at [facility].  Assessment and Plan:  Alcohol withdrawal syndrome/acute metabolic encephalopathy -Patient consumes large amounts of liquor.  He mentioned whiskey but according to H&P he consumes vodka.   -Symptoms appear to be improving.  Continue CIWA protocol while hospitalized . Continue multivitamins thiamine folic  acid at D/C   Lactic acidosis -Lactic acid level was noted to be greater than 9 on 3 different occasions.  Etiology unclear.  Probably had something to do with alcohol intake.  Salicylate level was not elevated.   Acetone, ethanol, isopropanol and methanol levels were undetectable.  Ethylene glycol was less than 5..   Lactic acid level did improve to 2.3.  Osmolality was 288.  No osmolar gap was noted.  Thiamine level is 122.2 which is in the normal range.   Diabetes mellitus type 2 with concern for DKA/diabetic neuropathy -Initially there was concern for DKA due to significantly elevated anion gap.  He was placed on insulin infusion.  CBGs have improved.  Anion gap closed.  Bicarbonate level improved.  Patient was transitioned to subcutaneous insulin.   HbA1c is 9.4.  CBGs have been reasonably well controlled. Not noted to be on any neuropathic pain medications.  Hopefully can start gabapentin soon. -CBGs ranging from 120-209  History of QT prolongation/history of torsades de points -This was in the setting of accidental overdose of Seroquel and loperamide in the past.  Avoid QT prolonging medications.  QTc was noted to be 500 at the time of admission. QT interval has improved but remains greater than 450.  Continue to aggressively replace potassium which is noted to be 3.6 today.  Magnesium is 2.1.  Recheck EKG in the outpatient setting    AKI -Unclear etiology; patient to be assessed creatinine went from 11/0.84 and is now 18/1.53 yesterday but improved after IVF -Given 1  bolus of normal saline and then started IV fluid hydration maintenance at 75 MLS per hour -Continue monitor and trend and obtaining urine electrolytes as well as a renal ultrasound -Renal U/S done and showed "No hydronephrosis, hydroureter, or renal lesion identified. Coarse echogenic liver with poor sonic penetration compatible with diffuse hepatic steatosis." -Continue monitor and trend and avoid nephrotoxic medications,  contrast dyes, hypotension and dehydration to ensure adequate renal perfusion and will need to renally dose medications -Repeat CMP within 1 week    Transaminitis/alcoholic liver disease with associated Hepatic Steatosis  Hyperbilirubinemia -Secondary to alcohol.  LFTs are slowly improving and stabilized.   -AST is now 68 -> 72 and ALT is now 64 -> 65 and T. bili is trended down to 1.3 yesterday and today is 1.0 CT scan of the abdomen pelvis showed mild hepatomegaly with fatty infiltration.  Normal gallbladder.   -Hepatitis panel is unremarkable. -Consider outpatient RUQ U/S after Alcohol Cessation  -Continue monitor and trend and repeat CMP within 1 week    History of depression/Bipolar Disorder -Patient recently evaluated at behavioral health.  According to his son and brother patient has been depressed.  He drinks heavily partly because of this.   Home medication list reviewed.  Noted to be on Zoloft and Seroquel prior to admission.  However there is also notation that he was not taking these medications. -Seen by psychiatry.  They recommend starting Prozac 20 mg daily and gabapentin 300 mg twice daily once QTc is within normal limits and can be followed at [facility]. We waited for QTc to be less than 450 and was 436 on last check so these meds were initiated.  Psychiatry continues to follow.  It looks like they are considering inpatient psychiatric treatment for this patien and now recommending inpatient psychiatric hospitalization and have referred the patient to inpatient [facility] at [facility] -Patient refused transfer to [facility] and psychiatry was reconsulted and recommending involuntary committing the patient given his high risk for unintentional overdose; IVC has been placed and patient is stable to be transferred to [facility]   Cough -Patient mentioned cough on 7/22.  Chest x-ray did not show any acute findings.  Noted to be afebrile.  WBC is not elevated.  Continue to monitor for  now.  No clear indication for antibiotics. -Appears to have improved.   Elevated lipase Possibly due to nausea.  CT scan did not show any evidence for pancreatitis.  Do not anticipate any further work-up at this time.     Nausea -Probably related to alcoholism.  Continue with as needed antiemetics.  -Continue PPI. -Diabetic gastroparesis could also be contributing.  Since his symptoms are better we will continue to monitor.  If he has recurrence and consideration can be given to giving him metoclopramide. Appears to have improved.  He is tolerating diet though not eating much.   Hypokalemia/hypomagnesemia -Multifactorial etiology for his electrolyte imbalance.  Continue to replace potassium and magnesium as indicated.  Keep levels of potassium greater than 4.0 and magnesium greater than 2.0 due to his history of QT prolongation. -Potassium was 3.7 this morning and mag level was 1.9 -Follow up within 1 week    Acute diarrhea -Probably related to alcohol withdrawal.  Abdomen is benign.  Imodium as needed. -Seems to have improved.   Accelerated Hypertension -Continue current regiment including amlodipine, hydralazine and losartan.  Dose adjustments were made to the hydralazine and amlodipine.  Blood pressure is much better controlled. -Continue to monitor blood  pressures per protocol with last blood pressure reading being 122/72   H/o Opioid Addiction -Was on suboxone but according to H&P he hasn't taken this in a month. According to [facility] it was last filled on 06/12/21.  -Continue to hold for now and follow up in outpatient setting   Normocytic Anemia -Likely Dilutional Drop from IVF  -Patient's Hgb/Hct went from 13.9/42.1 -> 11.8/35.4 -Continue to Monitor and Trend in the outpatient setting  -Repeat CBC within 1 week    Pancytopenia -Mild -Patient's WBC is now 3.7, Hgb/Hct is now 11.8/35.4, and now Platelet Count is 119 -Continue to Monitor and Trend in the outpatient  setting -Repeat CBC within 1 week  Hypoalbuminemia -Patient's Albumin Level is now gone from 3.7 -> 3.8 -> 3.3 -Continue to Monitor and Trend -Repeat CMP within 1 week  Thrombocytopenia Drop in platelet counts noted.  Likely due to alcoholism.  Currently not on any heparin products.   Old labs reviewed.  Platelet counts have been as low as 66,000 back in June.  So there is an element of chronicity. Counts are stable.  No evidence of bleeding.  Platelet count is actually improving and has gone from 88,000 is now 133,000 yesterday and today is 119,000 and relatively stable -Continue to monitor and repeat CBC within 1 week    Obesity -Complicates overall prognosis and care -Estimated body mass index is 30.81 kg/m as calculated from the following:   Height as of this encounter: _0  (1.88 m).   Weight as of this encounter: 108.9 kg.  -Weight Loss and Dietary Counseling given  Nutrition Documentation    [facility] H[facility]-Admission (Current) from 08/01/2021 in [place] [facility]  Nutrition Problem Inadequate oral intake  Etiology nausea, decreased appetite  Nutrition Goal Patient will meet greater than or equal to 90% of their needs  Interventions Refer to RD note for recommendations      Consultants: Psychiatry  Procedures performed: None  Disposition:  Psychiatric Facility   Diet recommendation:  Regular diet SOFT DISCHARGE MEDICATION: Allergies as of 08/08/2021       Reactions   Heparin Other (See Comments)   HIT Ab negative on 02/20/15, but SRA POSITIVE    Oxytetracycline Rash, Other (See Comments)   Del-mycin [erythromycin] Other (See Comments)   All "mycin drugs" - unknown reaction   Sulfa Antibiotics Other (See Comments)   Unknown reaction        Medication List     STOP taking these medications    atorvastatin 80 MG tablet Commonly known as: LIPITOR   buprenorphine 8 MG Subl SL tablet Commonly known as: SUBUTEX    chlordiazePOXIDE 25 MG capsule Commonly known as: LIBRIUM   furosemide 20 MG tablet Commonly known as: LASIX   hydrochlorothiazide 50 MG tablet Commonly known as: HYDRODIURIL   insulin glargine 100 UNIT/ML Solostar Pen Commonly known as: LANTUS   losartan 100 MG tablet Commonly known as: COZAAR   metFORMIN 500 MG 24 hr tablet Commonly known as: GLUCOPHAGE-XR   omeprazole 20 MG tablet Commonly known as: PRILOSEC OTC   prazosin 2 MG capsule Commonly known as: MINIPRESS   QUEtiapine 300 MG tablet Commonly known as: SEROQUEL   sertraline 100 MG tablet Commonly known as: ZOLOFT   Trulicity 1.32 GM/0.1UU Sopn Generic drug: Dulaglutide       TAKE these medications    amLODipine 10 MG tablet Commonly known as: NORVASC Take 1 tablet (10 mg total) by mouth daily.  Start taking on: August 09, 2021   blood glucose meter kit and supplies Kit Dispense based on patient and insurance preference. Use up to four times daily as directed.   feeding supplement (GLUCERNA SHAKE) Liqd Take 237 mLs by mouth 3 (three) times daily between meals.   FLUoxetine 20 MG capsule Commonly known as: PROZAC Take 1 capsule (20 mg total) by mouth daily.   folic acid 1 MG tablet Commonly known as: FOLVITE Take 1 tablet (1 mg total) by mouth daily. Start taking on: August 09, 2021   gabapentin 300 MG capsule Commonly known as: [name] Take 1 capsule (300 mg total) by mouth 2 (two) times daily.   hydrALAZINE 25 MG tablet Commonly known as: APRESOLINE Take 3 tablets (75 mg total) by mouth every 8 (eight) hours.   insulin glargine-yfgn 100 UNIT/ML injection Commonly known as: SEMGLEE Inject 0.15 mLs (15 Units total) into the skin daily.   lidocaine 5 % Commonly known as: LIDODERM Place 1 patch onto the skin daily. Remove & Discard patch within 12 hours or as directed by MD Start taking on: August 09, 2021   loperamide 2 MG capsule Commonly known as: IMODIUM Take 2 capsules (4 mg total)  by mouth 3 (three) times daily as needed for diarrhea or loose stools.   magnesium oxide 400 (240 Mg) MG tablet Commonly known as: MAG-OX Take 1 tablet (400 mg total) by mouth daily. Start taking on: August 09, 2021   multivitamin with minerals Tabs tablet Take 1 tablet by mouth daily. Start taking on: August 09, 2021   pantoprazole 40 MG tablet Commonly known as: PROTONIX Take 1 tablet (40 mg total) by mouth daily. Start taking on: August 09, 2021   Pen Needles 32G X 4 MM Misc 1 each by Does not apply route 3 (three) times daily before meals.   thiamine 100 MG tablet Commonly known as: VITAMIN B1 Take 1 tablet (100 mg total) by mouth daily. Start taking on: August 09, 2021        Discharge Exam: Danley Danker Weights   08/01/21 2133  Weight: 108.9 kg   Vitals:   08/07/21 2010 08/08/21 0549  BP: (!) 149/88 122/72  Pulse: 79 73  Resp: 18 18  Temp: 97.7 F (36.5 C) 98.2 F (36.8 C)  SpO2: 98% 100%   Examination: Physical Exam:  Constitutional: WN/WD obese Caucasian male in NAD and remains slightly tremulous Respiratory: Diminished to auscultation bilaterally, no wheezing, rales, rhonchi or crackles. Normal respiratory effort and patient is not tachypenic. No accessory muscle use. Unlabored breathing  Cardiovascular: RRR, no murmurs / rubs / gallops. S1 and S2 auscultated. No extremity edema.  Abdomen: Soft, non-tender, Distended 2/2 body habitus. No masses palpated. No appreciable hepatosplenomegaly. Bowel sounds positive.  GU: Deferred. Musculoskeletal: No clubbing / cyanosis of digits/nails. No joint deformity upper and lower extremities.  Skin: No rashes, lesions, ulcers on a limited skin evaluation. No induration; Warm and dry.  Neurologic: CN 2-12 grossly intact with no focal deficits. Romberg sign cerebellar and reflexes not assessed.  Psychiatric: Normal judgment and insight. Alert and oriented x 3. Normal mood and appropriate affect.   Condition at discharge:  stable  The results of significant diagnostics from this hospitalization (including imaging, microbiology, ancillary and laboratory) are listed below for reference.   Imaging Studies: US RENAL  Result Date: 08/07/2021 CLINICAL DATA:  Acute kidney injury EXAM: RENAL / URINARY TRACT ULTRASOUND COMPLETE COMPARISON:  08/03/2021 FINDINGS: Right Kidney: Renal measurements: 11.7 by 4.1 by  6.0 cm = volume: 149 mL. Echogenicity within normal limits. No mass or hydronephrosis visualized. Left Kidney: Renal measurements: 13.7 by 4.8 by 5.5 cm = volume: 187 mL. Echogenicity within normal limits. No mass or hydronephrosis visualized. Bladder: Appears normal for degree of bladder distention. Other: Incidental imaging of the liver reveals coarse echogenic liver with poor sonic penetration compatible with diffuse hepatic steatosis. IMPRESSION: 1. No hydronephrosis, hydroureter, or renal lesion identified. 2. Coarse echogenic liver with poor sonic penetration compatible with diffuse hepatic steatosis. Electronically Signed   By: Van Clines M.D.   On: 08/07/2021 11:47   CT ABDOMEN PELVIS W CONTRAST  Result Date: 08/03/2021 CLINICAL DATA:  Nausea vomiting. EXAM: CT ABDOMEN AND PELVIS WITH CONTRAST TECHNIQUE: Multidetector CT imaging of the abdomen and pelvis was performed using the standard protocol following bolus administration of intravenous contrast. RADIATION DOSE REDUCTION: This exam was performed according to the departmental dose-optimization program which includes automated exposure control, adjustment of the mA and/or kV according to patient size and/or use of iterative reconstruction technique. CONTRAST:  15m OMNIPAQUE IOHEXOL 300 MG/ML  SOLN COMPARISON:  03/20/2016. FINDINGS: Lower chest: No acute abnormality. Hepatobiliary: Liver mildly enlarged, 25 cm from superior to inferior. Diffuse decreased attenuation of the liver consistent with fatty infiltration. No liver mass. Normal gallbladder. No bile  duct dilation. Pancreas: Unremarkable. No pancreatic ductal dilatation or surrounding inflammatory changes. Spleen: Mildly enlarged, 16 cm in greatest dimension, unchanged. No splenic mass or focal lesion. Adrenals/Urinary Tract: Adrenal glands are unremarkable. Kidneys are normal, without renal calculi, focal lesion, or hydronephrosis. Bladder is unremarkable. Stomach/Bowel: Normal stomach. Small bowel and colon are normal in caliber. No wall thickening. No inflammation. Sigmoid colon and rectum mostly fluid-filled. Air-fluid levels noted in the right colon. Normal appendix visualized. Vascular/Lymphatic: Aortic atherosclerosis. No aneurysm. Several prominent gastrohepatic ligament lymph nodes. No enlarged abdominal or pelvic lymph nodes. Reproductive: Unremarkable. Other: No abdominal wall hernia or abnormality. No abdominopelvic ascites. Musculoskeletal: No fracture or acute finding.  No bone lesion. IMPRESSION: 1. No acute findings within the abdomen or pelvis. 2. There are colonic air-fluid levels, without wall thickening or adjacent inflammation. This is nonspecific, but can be seen with gastroenteritis/diarrhea. 3. Mild hepatosplenomegaly similar to the prior CT. 4. Diffuse and extensive hepatic steatosis that appears increased in severity from the prior CT. 5. Aortic atherosclerosis. Electronically Signed   By: DLajean ManesM.D.   On: 08/03/2021 15:04   DG CHEST PORT 1 VIEW  Result Date: 08/03/2021 CLINICAL DATA:  Cough.  Osteophytes for accidental overdose. EXAM: PORTABLE CHEST 1 VIEW COMPARISON:  AP chest 07/09/2021 FINDINGS: Mildly decreased lung volumes. Cardiac silhouette and mediastinal contours are within normal limits. The lungs clear. No pleural effusion or pneumothorax. Mild multilevel degenerative disc changes of the thoracic spine. IMPRESSION: No acute cardiopulmonary disease process. Electronically Signed   By: RYvonne KendallM.D.   On: 08/03/2021 11:32   DG Ribs Unilateral W/Chest  Right  Result Date: 08/01/2021 CLINICAL DATA:  Fall, pain EXAM: RIGHT RIBS AND CHEST - 3+ VIEW COMPARISON:  Chest radiograph dated 07/09/2021 FINDINGS: Lungs are clear.  No pleural effusion or pneumothorax. The heart is normal in size. No displaced right rib fracture is seen. IMPRESSION: Negative. Electronically Signed   By: SJulian HyM.D.   On: 08/01/2021 23:15   DG Hip Unilat W or Wo Pelvis 2-3 Views Right  Result Date: 08/01/2021 CLINICAL DATA:  Fall, pain EXAM: DG HIP (WITH OR WITHOUT PELVIS) 2-3V RIGHT COMPARISON:  None Available. FINDINGS:  No fracture or dislocation is seen. Bilateral hip joint spaces are preserved. Surgical clips overlying the left femoral head. Visualized bony pelvis appears intact. IMPRESSION: Negative. Electronically Signed   By: Julian Hy M.D.   On: 08/01/2021 23:14   DG Chest Port 1 View  Result Date: 07/09/2021 CLINICAL DATA:  Bilateral lower extremity edema, noncompliant on medication. EXAM: PORTABLE CHEST 1 VIEW COMPARISON:  Chest PA Lat 12/06/2020. FINDINGS: The left CP sulcus was excluded from the exam. The lungs are clear. The visualized sulci are sharp. There is mild cardiomegaly without evidence of CHF. There is no evidence of interstitial edema. Trace calcification in the aortic arch with mild aortic uncoiling. The mediastinal configuration is stable. No acute osseous abnormality is seen. No pneumothorax. IMPRESSION: No evidence of acute chest disease or significant changes. Mild cardiomegaly. Aortic atherosclerosis. Electronically Signed   By: Telford Nab M.D.   On: 07/09/2021 22:15   CT Head Wo Contrast  Result Date: 07/09/2021 CLINICAL DATA:  Altered mental status. EXAM: CT HEAD WITHOUT CONTRAST TECHNIQUE: Contiguous axial images were obtained from the base of the skull through the vertex without intravenous contrast. RADIATION DOSE REDUCTION: This exam was performed according to the departmental dose-optimization program which includes  automated exposure control, adjustment of the mA and/or kV according to patient size and/or use of iterative reconstruction technique. COMPARISON:  Head CT 11/25/2016 FINDINGS: Brain: No intracranial hemorrhage. No subdural or extra-axial collection. There is progressive enlargement of the lateral ventricles from prior exam with rounding of the temporal horns, no evidence of obstructing lesion. The basilar cisterns are patent. Minimal ill-defined low-density in the right cerebellum, series 3, image 9, new from prior exam, age indeterminate ischemia. There is no associated mass effect. No midline shift. Vascular: Atherosclerosis of skullbase vasculature without hyperdense vessel or abnormal calcification. Skull: No fracture or focal lesion. Sinuses/Orbits: Mild mucosal thickening of ethmoid air cells, no sinus fluid levels. No mastoid effusion. Other: None. IMPRESSION: 1. Minimal ill-defined low-density in the right cerebellum, new from 2018, consistent with age indeterminate ischemia. No associated mass effect. 2. Progressive enlargement of the lateral ventricles from prior with rounding of the temporal horns, no evidence of obstructing lesion. This may be secondary to central atrophy, however recommend clinical correlation for symptoms of normal pressure hydrocephalus. Electronically Signed   By: Keith Rake M.D.   On: 07/09/2021 21:43    Microbiology: Results for orders placed or performed during the hospital encounter of 08/01/21  MRSA Next Gen by PCR, Nasal     Status: None   Collection Time: 08/02/21  2:44 AM   Specimen: Nasal Mucosa; Nasal Swab  Result Value Ref Range Status   MRSA by PCR Next Gen NOT DETECTED NOT DETECTED Final    Comment: (NOTE) The GeneXpert MRSA Assay (FDA approved for NASAL specimens only), is one component of a comprehensive MRSA colonization surveillance program. It is not intended to diagnose MRSA infection nor to guide or monitor treatment for MRSA infections. Test  performance is not FDA approved in patients less than 66 years old. Performed at [facility], [place] [address] [place], [place] [address]    Labs: CBC: Recent Labs  Lab 08/01/21 2149 08/02/21 0537 08/04/21 0320 08/05/21 0306 08/06/21 0504 08/07/21 0539 08/08/21 0535  WBC 6.2   < > 3.4* 3.9* 3.9* 5.0 3.7*  NEUTROABS 4.5  --   --   --   --   --  1.8  HGB 14.8   < > 12.9* 13.3  13.3 13.9 11.8*  HCT 41.8   < > 37.9* 38.7* 39.1 42.1 35.4*  MCV 80.5   < > 83.8 83.4 84.3 86.1 87.2  PLT 137*   < > 66* 88* 100* 133* 119*   < > = values in this interval not displayed.   Basic Metabolic Panel: Recent Labs  Lab 08/02/21 0013 08/02/21 0245 08/03/21 0729 08/04/21 0320 08/05/21 0306 08/06/21 0504 08/07/21 0539 08/07/21 2024 08/08/21 0535  NA 132*   < > 139   < > 138 141 137 134* 136  K 6.6*   < > 3.4*   < > 3.6 3.5 3.6 4.4 3.7  CL 93*   < > 104   < > 106 107 103 104 106  CO2 16*   < > 23   < > _0 GLUCOSE 377*   < > 240*   < > 180* 216* 157* 234* 186*  BUN 11   < > 6   < > _1 CREATININE 1.00   < > 0.70   < > 0.87 0.84 1.53* 1.12 1.06  CALCIUM 7.6*   < > 8.4*   < > 9.0 9.4 9.3 9.5 8.8*  MG 1.6*  --  1.9  --  1.9 2.1  --   --  1.9  PHOS  --   --   --   --   --   --   --   --  4.7*   < > = values in this interval not displayed.   Liver Function Tests: Recent Labs  Lab 08/04/21 0320 08/05/21 0306 08/06/21 0504 08/07/21 0539 08/08/21 0535  AST 101* 71* 70* 68* 72*  ALT 74* 65* 61* 64* 65*  ALKPHOS 47 56 59 62 57  BILITOT 1.5* 1.6* 1.4* 1.3* 1.0  PROT 6.3* 6.7 6.6 6.9 6.0*  ALBUMIN 3.4* 3.7 3.7 3.8 3.3*   CBG: Recent Labs  Lab 08/07/21 1547 08/07/21 2027 08/07/21 2357 08/08/21 0409 08/08/21 0726  GLUCAP 189* 209* 218* 120* 164*    Discharge time spent: greater than 30 minutes.  Signed: Raiford Noble, DO [facility] 08/08/2021

## 2021-08-09 DIAGNOSIS — I10 Essential (primary) hypertension: Secondary | ICD-10-CM | POA: Diagnosis not present

## 2021-08-09 DIAGNOSIS — E119 Type 2 diabetes mellitus without complications: Secondary | ICD-10-CM | POA: Diagnosis not present

## 2021-08-13 DIAGNOSIS — I10 Essential (primary) hypertension: Secondary | ICD-10-CM | POA: Diagnosis not present

## 2021-08-13 DIAGNOSIS — E782 Mixed hyperlipidemia: Secondary | ICD-10-CM | POA: Diagnosis not present

## 2021-08-13 DIAGNOSIS — E119 Type 2 diabetes mellitus without complications: Secondary | ICD-10-CM | POA: Diagnosis not present

## 2021-08-17 DIAGNOSIS — E7800 Pure hypercholesterolemia, unspecified: Secondary | ICD-10-CM | POA: Diagnosis not present

## 2021-08-17 DIAGNOSIS — E119 Type 2 diabetes mellitus without complications: Secondary | ICD-10-CM | POA: Diagnosis not present

## 2021-08-17 DIAGNOSIS — G894 Chronic pain syndrome: Secondary | ICD-10-CM | POA: Diagnosis not present

## 2021-08-17 DIAGNOSIS — I10 Essential (primary) hypertension: Secondary | ICD-10-CM | POA: Diagnosis not present

## 2021-08-17 DIAGNOSIS — D508 Other iron deficiency anemias: Secondary | ICD-10-CM | POA: Diagnosis not present

## 2021-08-17 DIAGNOSIS — Z79899 Other long term (current) drug therapy: Secondary | ICD-10-CM | POA: Diagnosis not present

## 2021-08-17 DIAGNOSIS — D519 Vitamin B12 deficiency anemia, unspecified: Secondary | ICD-10-CM | POA: Diagnosis not present

## 2021-08-17 DIAGNOSIS — F32A Depression, unspecified: Secondary | ICD-10-CM | POA: Diagnosis not present

## 2021-08-17 DIAGNOSIS — Z0130 Encounter for examination of blood pressure without abnormal findings: Secondary | ICD-10-CM | POA: Diagnosis not present

## 2021-08-19 ENCOUNTER — Ambulatory Visit (HOSPITAL_COMMUNITY): Payer: Medicare Other | Admitting: Student in an Organized Health Care Education/Training Program

## 2021-08-21 DIAGNOSIS — Z79899 Other long term (current) drug therapy: Secondary | ICD-10-CM | POA: Diagnosis not present

## 2021-08-24 DIAGNOSIS — Z79899 Other long term (current) drug therapy: Secondary | ICD-10-CM | POA: Diagnosis not present

## 2021-08-24 DIAGNOSIS — R945 Abnormal results of liver function studies: Secondary | ICD-10-CM | POA: Diagnosis not present

## 2021-08-24 DIAGNOSIS — I10 Essential (primary) hypertension: Secondary | ICD-10-CM | POA: Diagnosis not present

## 2021-08-24 DIAGNOSIS — G894 Chronic pain syndrome: Secondary | ICD-10-CM | POA: Diagnosis not present

## 2021-08-24 DIAGNOSIS — E119 Type 2 diabetes mellitus without complications: Secondary | ICD-10-CM | POA: Diagnosis not present

## 2021-08-24 DIAGNOSIS — Z0130 Encounter for examination of blood pressure without abnormal findings: Secondary | ICD-10-CM | POA: Diagnosis not present

## 2021-08-24 DIAGNOSIS — E7800 Pure hypercholesterolemia, unspecified: Secondary | ICD-10-CM | POA: Diagnosis not present

## 2021-08-24 DIAGNOSIS — D519 Vitamin B12 deficiency anemia, unspecified: Secondary | ICD-10-CM | POA: Diagnosis not present

## 2021-08-24 DIAGNOSIS — D508 Other iron deficiency anemias: Secondary | ICD-10-CM | POA: Diagnosis not present

## 2021-08-24 DIAGNOSIS — F32A Depression, unspecified: Secondary | ICD-10-CM | POA: Diagnosis not present

## 2021-08-29 DIAGNOSIS — Z79899 Other long term (current) drug therapy: Secondary | ICD-10-CM | POA: Diagnosis not present

## 2021-09-13 DIAGNOSIS — R945 Abnormal results of liver function studies: Secondary | ICD-10-CM | POA: Diagnosis not present

## 2021-09-19 ENCOUNTER — Ambulatory Visit: Payer: Medicare Other | Admitting: Internal Medicine

## 2021-09-19 NOTE — Progress Notes (Deleted)
Name: [name] [name]  MRN/ DOB: [id number], 09-09-1960   Age/ Sex: 61 y.o., male    PCP: Camillia Herter, NP   Reason for Endocrinology Evaluation: Type 2 Diabetes Mellitus     Date of Initial Endocrinology Visit: 09/19/2021     PATIENT IDENTIFIER: [name] is a 61 y.o. male with a past medical history of T2DM, HTN,Hx alcohol abuse. The patient presented for initial endocrinology clinic visit on 09/19/2021 for consultative assistance with his diabetes management.    HPI: Mr. Buntrock was    Diagnosed with DM 2015 Prior Medications tried/Intolerance: *** Currently checking blood sugars *** x / day,  before breakfast and ***.  Hypoglycemia episodes : ***               Symptoms: ***                 Frequency: ***/  Hemoglobin A1c has ranged from 6.6% in 2019, peaking at 10.0% in 2021. Patient required assistance for hypoglycemia:  Patient has required hospitalization within the last 1 year from hyper or hypoglycemia: yes DKA 06/2021 and alcohol use disorder  In terms of diet, the patient ***   HOME DIABETES REGIMEN: Semglee   Statin: No ACE-I/ARB: No    METER DOWNLOAD SUMMARY: Date range evaluated: *** Fingerstick Blood Glucose Tests = *** Average Number Tests/Day = *** Overall Mean FS Glucose = *** Standard Deviation = ***  BG Ranges: Low = *** High = ***   Hypoglycemic Events/30 Days: BG < 50 = *** Episodes of symptomatic severe hypoglycemia = ***   DIABETIC COMPLICATIONS: Microvascular complications:  Neuropathy Denies: CKD Last eye exam: Completed   Macrovascular complications:   Denies: CAD, PVD, CVA   PAST HISTORY: Past Medical History:  Past Medical History:  Diagnosis Date   Anxiety    Arthritis    Bipolar 1 disorder ([place])    Blood transfusion without reported diagnosis    Cataract    Chronic pain    Depressed bipolar disorder ([place])    Depression    Diabetes mellitus without complication ([place])    Diabetic neuropathy ([place])     GERD (gastroesophageal reflux disease)    Herpes zoster 12/05/2008   Qualifier: Diagnosis of  By: Ronnald Ramp MD, Arvid Right.    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  Coupled with high doses of loperamide used for pain control.  Unintentional overdose -intractable VT leading to cardiogenic shock - ECMO   Hyperlipidemia    Hypertension    Neuromuscular disorder ([place])    nerve damage back, neck, and shoulder   Neuropathy in diabetes ([place])    Sleep apnea    has CPAP but cannot use it   Substance abuse ([place])    in past    Past Surgical History:  Past Surgical History:  Procedure Laterality Date   COLONOSCOPY     EXTRACORPOREAL CIRCULATION  11/2015   FOR Cardiogenic shock related to intractable [place] VT storm (prolonged QT from drug toxixcity)    INTRAOPERATIVE TRANSESOPHAGEAL ECHOCARDIOGRAM N/A 11/26/2016   Procedure: INTRAOPERATIVE TRANSESOPHAGEAL ECHOCARDIOGRAM;  Surgeon: Ivin Poot, MD;  Location: [facility];  Service: Open Heart Surgery;  Laterality: N/A;   IR ANGIOGRAM EXTREMITY LEFT  06/05/2020   IR PTA NON CORO-LOWER EXTREM  06/05/2020   IR RADIOLOGIST EVAL & MGMT  05/21/2020   IR US GUIDE VASC ACCESS LEFT  06/05/2020  POLYPECTOMY     SHOULDER ARTHROSCOPY WITH ROTATOR CUFF REPAIR Right 2009   SPINE SURGERY  2010   TRANSTHORACIC ECHOCARDIOGRAM  07/2016; 12/10/2016   a. Prior to VT arrest: normal. EF 55-60%. Gr 1 DD.  mild LVH. Mildly dilated Aortic Root.;; b.  Normal LV size and function.  Mild LVH.  EF 55%.  No RWMA.  No valve abnormalities.    Social History:  reports that he has never smoked. He has never used smokeless tobacco. He reports current alcohol use. He reports that he does not use drugs. Family History:  Family History  Problem Relation Age of Onset   Diabetes Mother    Hyperlipidemia Mother    Heart disease Father    Colon cancer Neg Hx    Colon polyps Neg Hx    Esophageal  cancer Neg Hx    Rectal cancer Neg Hx    Stomach cancer Neg Hx      HOME MEDICATIONS: Allergies as of 09/19/2021       Reactions   Heparin Other (See Comments)   HIT Ab negative on 02/20/15, but SRA POSITIVE    Oxytetracycline Rash, Other (See Comments)   Del-mycin [erythromycin] Other (See Comments)   All "mycin drugs" - unknown reaction   Sulfa Antibiotics Other (See Comments)   Unknown reaction        Medication List        Accurate as of September 19, 2021  7:20 AM. If you have any questions, ask your nurse or doctor.          amLODipine 10 MG tablet Commonly known as: NORVASC Take 1 tablet (10 mg total) by mouth daily.   blood glucose meter kit and supplies Kit Dispense based on patient and insurance preference. Use up to four times daily as directed.   feeding supplement (GLUCERNA SHAKE) Liqd Take 237 mLs by mouth 3 (three) times daily between meals.   FLUoxetine 20 MG capsule Commonly known as: PROZAC Take 1 capsule (20 mg total) by mouth daily.   folic acid 1 MG tablet Commonly known as: FOLVITE Take 1 tablet (1 mg total) by mouth daily.   gabapentin 300 MG capsule Commonly known as: [name] Take 1 capsule (300 mg total) by mouth 2 (two) times daily.   hydrALAZINE 25 MG tablet Commonly known as: APRESOLINE Take 3 tablets (75 mg total) by mouth every 8 (eight) hours.   insulin glargine-yfgn 100 UNIT/ML injection Commonly known as: SEMGLEE Inject 0.15 mLs (15 Units total) into the skin daily.   lidocaine 5 % Commonly known as: LIDODERM Place 1 patch onto the skin daily. Remove & Discard patch within 12 hours or as directed by MD   loperamide 2 MG capsule Commonly known as: IMODIUM Take 2 capsules (4 mg total) by mouth 3 (three) times daily as needed for diarrhea or loose stools.   magnesium oxide 400 (240 Mg) MG tablet Commonly known as: MAG-OX Take 1 tablet (400 mg total) by mouth daily.   multivitamin with minerals Tabs tablet Take 1  tablet by mouth daily.   pantoprazole 40 MG tablet Commonly known as: PROTONIX Take 1 tablet (40 mg total) by mouth daily.   thiamine 100 MG tablet Commonly known as: VITAMIN B1 Take 1 tablet (100 mg total) by mouth daily.         ALLERGIES: Allergies  Allergen Reactions   Heparin Other (See Comments)    HIT Ab negative on 02/20/15, but SRA POSITIVE    Oxytetracycline  Rash and Other (See Comments)   Del-Mycin [Erythromycin] Other (See Comments)    All "mycin drugs" - unknown reaction   Sulfa Antibiotics Other (See Comments)    Unknown reaction     REVIEW OF SYSTEMS: A comprehensive ROS was conducted with the patient and is negative except as per HPI and below:  ROS    OBJECTIVE:   VITAL SIGNS: There were no vitals taken for this visit.   PHYSICAL EXAM:  General: Pt appears well and is in NAD  Neck: General: Supple without adenopathy or carotid bruits. Thyroid: Thyroid size normal.  No goiter or nodules appreciated.   Lungs: Clear with good BS bilat with no rales, rhonchi, or wheezes  Heart: RRR   Abdomen:  soft, nontender  Extremities:  Lower extremities - No pretibial edema. No lesions.  Skin: Normal texture and temperature to palpation. No rash noted.  Neuro: MS is good with appropriate affect, pt is alert and Ox3    DM foot exam:    DATA REVIEWED:  Lab Results  Component Value Date   HGBA1C 9.4 (H) 08/03/2021   HGBA1C 9.6 (A) 01/25/2021   HGBA1C 9.1 (A) 09/19/2020    Latest Reference Range & Units 08/08/21 05:35  Sodium 135 - 145 mmol/L 136  Potassium 3.5 - 5.1 mmol/L 3.7  Chloride 98 - 111 mmol/L 106  CO2 22 - 32 mmol/L 22  Glucose 70 - 99 mg/dL 186 (H)  BUN 6 - 20 mg/dL 19  Creatinine 0.61 - 1.24 mg/dL 1.06  Calcium 8.9 - 10.3 mg/dL 8.8 (L)  Anion gap 5 - 15  8  Phosphorus 2.5 - 4.6 mg/dL 4.7 (H)  Magnesium 1.7 - 2.4 mg/dL 1.9  Alkaline Phosphatase 38 - 126 U/L 57  Albumin 3.5 - 5.0 g/dL 3.3 (L)  AST 15 - 41 U/L 72 (H)  ALT 0 - 44 U/L 65  (H)  Total Protein 6.5 - 8.1 g/dL 6.0 (L)  Total Bilirubin 0.3 - 1.2 mg/dL 1.0  GFR, Estimated >60 mL/min >60    ASSESSMENT / PLAN / RECOMMENDATIONS:   1) Type 2 Diabetes Mellitus, ***controlled, With neuropathic complications - Most recent A1c of *** %. Goal A1c < 7.0 %.    Plan: GENERAL: - -Patient is NOT a candidate for SGLT2 inhibitors due to history of multiple DKA's in the past  MEDICATIONS: ***  EDUCATION / INSTRUCTIONS: BG monitoring instructions: Patient is instructed to check his blood sugars *** times a day, ***. Call [facility] if: BG persistently < 70  I reviewed the Rule of 15 for the treatment of hypoglycemia in detail with the patient. Literature supplied.   2) Diabetic complications:  Eye: Does *** have known diabetic retinopathy.  Neuro/ Feet: Does  have known diabetic peripheral neuropathy. Renal: Patient does not have known baseline CKD. He is not on an ACEI/ARB at present.  3) Hypertriglyceridemia:       Signed electronically by: Mack Guise, MD  [facility]  [facility] [address] [place], [place] [address] Phone: [phone] FAX: [phone]   CC: Camillia Herter, NP [place] [address] Phone: [phone]  Fax: [phone]    Return to Endocrinology clinic as below: Future Appointments  Date Time Provider [place]  09/19/2021 10:30 AM Chee Dimon, Melanie Crazier, MD [facility] None

## 2021-09-21 DIAGNOSIS — D508 Other iron deficiency anemias: Secondary | ICD-10-CM | POA: Diagnosis not present

## 2021-09-21 DIAGNOSIS — D519 Vitamin B12 deficiency anemia, unspecified: Secondary | ICD-10-CM | POA: Diagnosis not present

## 2021-09-21 DIAGNOSIS — E538 Deficiency of other specified B group vitamins: Secondary | ICD-10-CM | POA: Diagnosis not present

## 2021-09-21 DIAGNOSIS — E7800 Pure hypercholesterolemia, unspecified: Secondary | ICD-10-CM | POA: Diagnosis not present

## 2021-09-21 DIAGNOSIS — M129 Arthropathy, unspecified: Secondary | ICD-10-CM | POA: Diagnosis not present

## 2021-09-21 DIAGNOSIS — Z79899 Other long term (current) drug therapy: Secondary | ICD-10-CM | POA: Diagnosis not present

## 2021-09-21 DIAGNOSIS — I10 Essential (primary) hypertension: Secondary | ICD-10-CM | POA: Diagnosis not present

## 2021-09-21 DIAGNOSIS — Z0130 Encounter for examination of blood pressure without abnormal findings: Secondary | ICD-10-CM | POA: Diagnosis not present

## 2021-09-21 DIAGNOSIS — M5136 Other intervertebral disc degeneration, lumbar region: Secondary | ICD-10-CM | POA: Diagnosis not present

## 2021-09-21 DIAGNOSIS — E119 Type 2 diabetes mellitus without complications: Secondary | ICD-10-CM | POA: Diagnosis not present

## 2021-09-27 ENCOUNTER — Other Ambulatory Visit: Payer: Self-pay

## 2021-09-30 ENCOUNTER — Encounter: Payer: Self-pay | Admitting: *Deleted

## 2021-09-30 NOTE — Progress Notes (Signed)
[facility] Quality Team Note  Name: [name] [name] Date of Birth: 1960/03/28 MRN: [id number] Date: 09/30/2021  [facility] Quality Team has reviewed this patient's chart, please see recommendations below:  [facility] Quality Other; (Pt has open gap for KED measure.  Pt would need urine albumin creatinine ratio test done in order to close gap.)

## 2021-10-09 NOTE — Progress Notes (Signed)
Called pt to offer sending out kit to him or arranging to have done at office.  Pt declined for now but said he would call the office back when ready to arrange.  Sending as [name] [name].  Pt would need urine albumin creatinine ratio test to close gap.

## 2021-10-10 ENCOUNTER — Other Ambulatory Visit: Payer: Self-pay

## 2021-10-18 DIAGNOSIS — D539 Nutritional anemia, unspecified: Secondary | ICD-10-CM | POA: Diagnosis not present

## 2021-10-18 DIAGNOSIS — R5383 Other fatigue: Secondary | ICD-10-CM | POA: Diagnosis not present

## 2021-10-18 DIAGNOSIS — D508 Other iron deficiency anemias: Secondary | ICD-10-CM | POA: Diagnosis not present

## 2021-10-18 DIAGNOSIS — I10 Essential (primary) hypertension: Secondary | ICD-10-CM | POA: Diagnosis not present

## 2021-10-18 DIAGNOSIS — E119 Type 2 diabetes mellitus without complications: Secondary | ICD-10-CM | POA: Diagnosis not present

## 2021-10-18 DIAGNOSIS — R030 Elevated blood-pressure reading, without diagnosis of hypertension: Secondary | ICD-10-CM | POA: Diagnosis not present

## 2021-10-18 DIAGNOSIS — E7800 Pure hypercholesterolemia, unspecified: Secondary | ICD-10-CM | POA: Diagnosis not present

## 2021-10-18 DIAGNOSIS — Z0130 Encounter for examination of blood pressure without abnormal findings: Secondary | ICD-10-CM | POA: Diagnosis not present

## 2021-10-18 DIAGNOSIS — R300 Dysuria: Secondary | ICD-10-CM | POA: Diagnosis not present

## 2021-10-18 DIAGNOSIS — Z79899 Other long term (current) drug therapy: Secondary | ICD-10-CM | POA: Diagnosis not present

## 2021-10-24 ENCOUNTER — Other Ambulatory Visit: Payer: Self-pay

## 2021-10-30 ENCOUNTER — Telehealth: Payer: Self-pay | Admitting: *Deleted

## 2021-10-30 NOTE — Chronic Care Management (AMB) (Unsigned)
  Care Coordination  Outreach Note  10/30/2021 Name: [name] [name] MRN: [id number] DOB: 02/14/1960   Care Coordination Outreach Attempts: An unsuccessful telephone outreach was attempted today to offer the patient information about available care coordination services as a benefit of their health plan.   Follow Up Plan:  Additional outreach attempts will be made to offer the patient care coordination information and services.   Encounter Outcome:  No Answer  [place]  Direct Dial: [phone]

## 2021-10-31 NOTE — Chronic Care Management (AMB) (Signed)
  Care Coordination   Note   10/31/2021 Name: [name] [name] MRN: [id number] DOB: 21-Jul-1960  [name] [name] is a 61 y.o. year old male who sees Camillia Herter, NP for primary care. I reached out to [name] [name] by phone today to offer care coordination services.  [name] was given information about Care Coordination services today including:   The Care Coordination services include support from the care team which includes your Nurse Coordinator, Clinical Social Worker, or Pharmacist.  The Care Coordination team is here to help remove barriers to the health concerns and goals most important to you. Care Coordination services are voluntary, and the patient may decline or stop services at any time by request to their care team member.   Care Coordination Consent Status: Patient did not agree to participate in care coordination services at this time.    Encounter Outcome:  Pt. Refused  [place]  Direct Dial: [phone]

## 2021-11-18 DIAGNOSIS — K760 Fatty (change of) liver, not elsewhere classified: Secondary | ICD-10-CM | POA: Diagnosis not present

## 2021-11-18 DIAGNOSIS — D508 Other iron deficiency anemias: Secondary | ICD-10-CM | POA: Diagnosis not present

## 2021-11-18 DIAGNOSIS — Z0130 Encounter for examination of blood pressure without abnormal findings: Secondary | ICD-10-CM | POA: Diagnosis not present

## 2021-11-18 DIAGNOSIS — I10 Essential (primary) hypertension: Secondary | ICD-10-CM | POA: Diagnosis not present

## 2021-11-18 DIAGNOSIS — Z79899 Other long term (current) drug therapy: Secondary | ICD-10-CM | POA: Diagnosis not present

## 2021-11-18 DIAGNOSIS — E7800 Pure hypercholesterolemia, unspecified: Secondary | ICD-10-CM | POA: Diagnosis not present

## 2021-11-18 DIAGNOSIS — E119 Type 2 diabetes mellitus without complications: Secondary | ICD-10-CM | POA: Diagnosis not present

## 2021-11-18 DIAGNOSIS — M549 Dorsalgia, unspecified: Secondary | ICD-10-CM | POA: Diagnosis not present

## 2021-11-18 DIAGNOSIS — M160 Bilateral primary osteoarthritis of hip: Secondary | ICD-10-CM | POA: Diagnosis not present

## 2021-11-25 DIAGNOSIS — Z79899 Other long term (current) drug therapy: Secondary | ICD-10-CM | POA: Diagnosis not present

## 2021-12-18 DIAGNOSIS — Z79899 Other long term (current) drug therapy: Secondary | ICD-10-CM | POA: Diagnosis not present

## 2021-12-18 DIAGNOSIS — Z0130 Encounter for examination of blood pressure without abnormal findings: Secondary | ICD-10-CM | POA: Diagnosis not present

## 2021-12-18 DIAGNOSIS — K760 Fatty (change of) liver, not elsewhere classified: Secondary | ICD-10-CM | POA: Diagnosis not present

## 2021-12-18 DIAGNOSIS — R030 Elevated blood-pressure reading, without diagnosis of hypertension: Secondary | ICD-10-CM | POA: Diagnosis not present

## 2021-12-18 DIAGNOSIS — E119 Type 2 diabetes mellitus without complications: Secondary | ICD-10-CM | POA: Diagnosis not present

## 2021-12-18 DIAGNOSIS — D508 Other iron deficiency anemias: Secondary | ICD-10-CM | POA: Diagnosis not present

## 2021-12-18 DIAGNOSIS — I10 Essential (primary) hypertension: Secondary | ICD-10-CM | POA: Diagnosis not present

## 2021-12-18 DIAGNOSIS — E7800 Pure hypercholesterolemia, unspecified: Secondary | ICD-10-CM | POA: Diagnosis not present

## 2021-12-18 DIAGNOSIS — Z23 Encounter for immunization: Secondary | ICD-10-CM | POA: Diagnosis not present

## 2021-12-24 DIAGNOSIS — Z79899 Other long term (current) drug therapy: Secondary | ICD-10-CM | POA: Diagnosis not present

## 2022-01-20 DIAGNOSIS — E7800 Pure hypercholesterolemia, unspecified: Secondary | ICD-10-CM | POA: Diagnosis not present

## 2022-01-20 DIAGNOSIS — D508 Other iron deficiency anemias: Secondary | ICD-10-CM | POA: Diagnosis not present

## 2022-01-20 DIAGNOSIS — E119 Type 2 diabetes mellitus without complications: Secondary | ICD-10-CM | POA: Diagnosis not present

## 2022-01-20 DIAGNOSIS — Z79899 Other long term (current) drug therapy: Secondary | ICD-10-CM | POA: Diagnosis not present

## 2022-01-20 DIAGNOSIS — I10 Essential (primary) hypertension: Secondary | ICD-10-CM | POA: Diagnosis not present

## 2022-01-27 DIAGNOSIS — Z79899 Other long term (current) drug therapy: Secondary | ICD-10-CM | POA: Diagnosis not present

## 2022-01-29 ENCOUNTER — Telehealth: Payer: Self-pay | Admitting: Family

## 2022-01-29 NOTE — Telephone Encounter (Signed)
Left message for patient to call back and schedule Medicare Annual Wellness Visit (AWV) either virtually or phone . Left  my [name] [name] number [phone]   Last AWV 01/25/21

## 2022-02-19 DIAGNOSIS — E119 Type 2 diabetes mellitus without complications: Secondary | ICD-10-CM | POA: Diagnosis not present

## 2022-02-19 DIAGNOSIS — I10 Essential (primary) hypertension: Secondary | ICD-10-CM | POA: Diagnosis not present

## 2022-02-19 DIAGNOSIS — Z0130 Encounter for examination of blood pressure without abnormal findings: Secondary | ICD-10-CM | POA: Diagnosis not present

## 2022-02-19 DIAGNOSIS — Z79899 Other long term (current) drug therapy: Secondary | ICD-10-CM | POA: Diagnosis not present

## 2022-02-19 DIAGNOSIS — E7800 Pure hypercholesterolemia, unspecified: Secondary | ICD-10-CM | POA: Diagnosis not present

## 2022-02-19 DIAGNOSIS — D508 Other iron deficiency anemias: Secondary | ICD-10-CM | POA: Diagnosis not present

## 2022-02-26 DIAGNOSIS — E559 Vitamin D deficiency, unspecified: Secondary | ICD-10-CM | POA: Diagnosis not present

## 2022-03-12 DIAGNOSIS — Z86010 Personal history of colonic polyps: Secondary | ICD-10-CM | POA: Diagnosis not present

## 2022-03-12 DIAGNOSIS — K7400 Hepatic fibrosis, unspecified: Secondary | ICD-10-CM | POA: Diagnosis not present

## 2022-03-18 DIAGNOSIS — M129 Arthropathy, unspecified: Secondary | ICD-10-CM | POA: Diagnosis not present

## 2022-03-18 DIAGNOSIS — E7800 Pure hypercholesterolemia, unspecified: Secondary | ICD-10-CM | POA: Diagnosis not present

## 2022-03-18 DIAGNOSIS — D508 Other iron deficiency anemias: Secondary | ICD-10-CM | POA: Diagnosis not present

## 2022-03-18 DIAGNOSIS — I10 Essential (primary) hypertension: Secondary | ICD-10-CM | POA: Diagnosis not present

## 2022-03-18 DIAGNOSIS — Z0130 Encounter for examination of blood pressure without abnormal findings: Secondary | ICD-10-CM | POA: Diagnosis not present

## 2022-03-18 DIAGNOSIS — E119 Type 2 diabetes mellitus without complications: Secondary | ICD-10-CM | POA: Diagnosis not present

## 2022-03-18 DIAGNOSIS — E559 Vitamin D deficiency, unspecified: Secondary | ICD-10-CM | POA: Diagnosis not present

## 2022-03-18 DIAGNOSIS — R5383 Other fatigue: Secondary | ICD-10-CM | POA: Diagnosis not present

## 2022-03-24 DIAGNOSIS — Z79899 Other long term (current) drug therapy: Secondary | ICD-10-CM | POA: Diagnosis not present

## 2022-04-15 DIAGNOSIS — M129 Arthropathy, unspecified: Secondary | ICD-10-CM | POA: Diagnosis not present

## 2022-04-15 DIAGNOSIS — D508 Other iron deficiency anemias: Secondary | ICD-10-CM | POA: Diagnosis not present

## 2022-04-15 DIAGNOSIS — E119 Type 2 diabetes mellitus without complications: Secondary | ICD-10-CM | POA: Diagnosis not present

## 2022-04-15 DIAGNOSIS — Z0130 Encounter for examination of blood pressure without abnormal findings: Secondary | ICD-10-CM | POA: Diagnosis not present

## 2022-04-15 DIAGNOSIS — I10 Essential (primary) hypertension: Secondary | ICD-10-CM | POA: Diagnosis not present

## 2022-04-15 DIAGNOSIS — E559 Vitamin D deficiency, unspecified: Secondary | ICD-10-CM | POA: Diagnosis not present

## 2022-04-15 DIAGNOSIS — G2581 Restless legs syndrome: Secondary | ICD-10-CM | POA: Diagnosis not present

## 2022-04-15 DIAGNOSIS — E7800 Pure hypercholesterolemia, unspecified: Secondary | ICD-10-CM | POA: Diagnosis not present

## 2022-04-21 DIAGNOSIS — Z79899 Other long term (current) drug therapy: Secondary | ICD-10-CM | POA: Diagnosis not present

## 2022-05-13 DIAGNOSIS — E119 Type 2 diabetes mellitus without complications: Secondary | ICD-10-CM | POA: Diagnosis not present

## 2022-05-13 DIAGNOSIS — G2581 Restless legs syndrome: Secondary | ICD-10-CM | POA: Diagnosis not present

## 2022-05-13 DIAGNOSIS — I10 Essential (primary) hypertension: Secondary | ICD-10-CM | POA: Diagnosis not present

## 2022-05-13 DIAGNOSIS — D508 Other iron deficiency anemias: Secondary | ICD-10-CM | POA: Diagnosis not present

## 2022-05-13 DIAGNOSIS — E7800 Pure hypercholesterolemia, unspecified: Secondary | ICD-10-CM | POA: Diagnosis not present

## 2022-05-13 DIAGNOSIS — Z0130 Encounter for examination of blood pressure without abnormal findings: Secondary | ICD-10-CM | POA: Diagnosis not present

## 2022-05-13 DIAGNOSIS — Z79899 Other long term (current) drug therapy: Secondary | ICD-10-CM | POA: Diagnosis not present

## 2022-05-16 DIAGNOSIS — Z79899 Other long term (current) drug therapy: Secondary | ICD-10-CM | POA: Diagnosis not present

## 2022-06-12 DIAGNOSIS — E7800 Pure hypercholesterolemia, unspecified: Secondary | ICD-10-CM | POA: Diagnosis not present

## 2022-06-12 DIAGNOSIS — Z79899 Other long term (current) drug therapy: Secondary | ICD-10-CM | POA: Diagnosis not present

## 2022-06-12 DIAGNOSIS — I10 Essential (primary) hypertension: Secondary | ICD-10-CM | POA: Diagnosis not present

## 2022-06-12 DIAGNOSIS — D508 Other iron deficiency anemias: Secondary | ICD-10-CM | POA: Diagnosis not present

## 2022-06-12 DIAGNOSIS — E119 Type 2 diabetes mellitus without complications: Secondary | ICD-10-CM | POA: Diagnosis not present

## 2022-06-12 DIAGNOSIS — G2581 Restless legs syndrome: Secondary | ICD-10-CM | POA: Diagnosis not present

## 2022-06-16 DIAGNOSIS — Z79899 Other long term (current) drug therapy: Secondary | ICD-10-CM | POA: Diagnosis not present

## 2022-06-17 DIAGNOSIS — Z79899 Other long term (current) drug therapy: Secondary | ICD-10-CM | POA: Diagnosis not present

## 2022-06-17 DIAGNOSIS — E119 Type 2 diabetes mellitus without complications: Secondary | ICD-10-CM | POA: Diagnosis not present

## 2022-06-17 DIAGNOSIS — E7800 Pure hypercholesterolemia, unspecified: Secondary | ICD-10-CM | POA: Diagnosis not present

## 2022-06-17 DIAGNOSIS — M5136 Other intervertebral disc degeneration, lumbar region: Secondary | ICD-10-CM | POA: Diagnosis not present

## 2022-06-17 DIAGNOSIS — G2581 Restless legs syndrome: Secondary | ICD-10-CM | POA: Diagnosis not present

## 2022-06-17 DIAGNOSIS — M5030 Other cervical disc degeneration, unspecified cervical region: Secondary | ICD-10-CM | POA: Diagnosis not present

## 2022-06-17 DIAGNOSIS — I10 Essential (primary) hypertension: Secondary | ICD-10-CM | POA: Diagnosis not present

## 2022-06-17 DIAGNOSIS — D508 Other iron deficiency anemias: Secondary | ICD-10-CM | POA: Diagnosis not present

## 2022-06-20 DIAGNOSIS — Z79899 Other long term (current) drug therapy: Secondary | ICD-10-CM | POA: Diagnosis not present

## 2022-07-16 DIAGNOSIS — E7800 Pure hypercholesterolemia, unspecified: Secondary | ICD-10-CM | POA: Diagnosis not present

## 2022-07-16 DIAGNOSIS — G2581 Restless legs syndrome: Secondary | ICD-10-CM | POA: Diagnosis not present

## 2022-07-16 DIAGNOSIS — Z79899 Other long term (current) drug therapy: Secondary | ICD-10-CM | POA: Diagnosis not present

## 2022-07-16 DIAGNOSIS — D508 Other iron deficiency anemias: Secondary | ICD-10-CM | POA: Diagnosis not present

## 2022-07-16 DIAGNOSIS — I10 Essential (primary) hypertension: Secondary | ICD-10-CM | POA: Diagnosis not present

## 2022-07-16 DIAGNOSIS — M62830 Muscle spasm of back: Secondary | ICD-10-CM | POA: Diagnosis not present

## 2022-07-16 DIAGNOSIS — R030 Elevated blood-pressure reading, without diagnosis of hypertension: Secondary | ICD-10-CM | POA: Diagnosis not present

## 2022-07-16 DIAGNOSIS — M5030 Other cervical disc degeneration, unspecified cervical region: Secondary | ICD-10-CM | POA: Diagnosis not present

## 2022-07-16 DIAGNOSIS — E119 Type 2 diabetes mellitus without complications: Secondary | ICD-10-CM | POA: Diagnosis not present

## 2022-07-16 DIAGNOSIS — M5136 Other intervertebral disc degeneration, lumbar region: Secondary | ICD-10-CM | POA: Diagnosis not present

## 2022-07-21 DIAGNOSIS — Z79899 Other long term (current) drug therapy: Secondary | ICD-10-CM | POA: Diagnosis not present

## 2022-08-18 DIAGNOSIS — M5030 Other cervical disc degeneration, unspecified cervical region: Secondary | ICD-10-CM | POA: Diagnosis not present

## 2022-08-18 DIAGNOSIS — I10 Essential (primary) hypertension: Secondary | ICD-10-CM | POA: Diagnosis not present

## 2022-08-18 DIAGNOSIS — E7800 Pure hypercholesterolemia, unspecified: Secondary | ICD-10-CM | POA: Diagnosis not present

## 2022-08-18 DIAGNOSIS — M5136 Other intervertebral disc degeneration, lumbar region: Secondary | ICD-10-CM | POA: Diagnosis not present

## 2022-08-18 DIAGNOSIS — E119 Type 2 diabetes mellitus without complications: Secondary | ICD-10-CM | POA: Diagnosis not present

## 2022-08-18 DIAGNOSIS — Z79899 Other long term (current) drug therapy: Secondary | ICD-10-CM | POA: Diagnosis not present

## 2022-08-18 DIAGNOSIS — M62830 Muscle spasm of back: Secondary | ICD-10-CM | POA: Diagnosis not present

## 2022-08-18 DIAGNOSIS — G2581 Restless legs syndrome: Secondary | ICD-10-CM | POA: Diagnosis not present

## 2022-08-18 DIAGNOSIS — Z0000 Encounter for general adult medical examination without abnormal findings: Secondary | ICD-10-CM | POA: Diagnosis not present

## 2022-08-18 DIAGNOSIS — D508 Other iron deficiency anemias: Secondary | ICD-10-CM | POA: Diagnosis not present

## 2022-08-20 DIAGNOSIS — Z79899 Other long term (current) drug therapy: Secondary | ICD-10-CM | POA: Diagnosis not present

## 2022-09-17 DIAGNOSIS — M5030 Other cervical disc degeneration, unspecified cervical region: Secondary | ICD-10-CM | POA: Diagnosis not present

## 2022-09-17 DIAGNOSIS — M5136 Other intervertebral disc degeneration, lumbar region: Secondary | ICD-10-CM | POA: Diagnosis not present

## 2022-09-17 DIAGNOSIS — M62830 Muscle spasm of back: Secondary | ICD-10-CM | POA: Diagnosis not present

## 2022-09-17 DIAGNOSIS — Z79899 Other long term (current) drug therapy: Secondary | ICD-10-CM | POA: Diagnosis not present

## 2022-09-17 DIAGNOSIS — D508 Other iron deficiency anemias: Secondary | ICD-10-CM | POA: Diagnosis not present

## 2022-09-17 DIAGNOSIS — E119 Type 2 diabetes mellitus without complications: Secondary | ICD-10-CM | POA: Diagnosis not present

## 2022-09-17 DIAGNOSIS — I10 Essential (primary) hypertension: Secondary | ICD-10-CM | POA: Diagnosis not present

## 2022-09-17 DIAGNOSIS — E7800 Pure hypercholesterolemia, unspecified: Secondary | ICD-10-CM | POA: Diagnosis not present

## 2022-09-17 DIAGNOSIS — G2581 Restless legs syndrome: Secondary | ICD-10-CM | POA: Diagnosis not present

## 2022-09-19 DIAGNOSIS — Z79899 Other long term (current) drug therapy: Secondary | ICD-10-CM | POA: Diagnosis not present

## 2022-10-17 DIAGNOSIS — Z79899 Other long term (current) drug therapy: Secondary | ICD-10-CM | POA: Diagnosis not present

## 2022-10-17 DIAGNOSIS — M62830 Muscle spasm of back: Secondary | ICD-10-CM | POA: Diagnosis not present

## 2022-10-17 DIAGNOSIS — M5030 Other cervical disc degeneration, unspecified cervical region: Secondary | ICD-10-CM | POA: Diagnosis not present

## 2022-10-17 DIAGNOSIS — R030 Elevated blood-pressure reading, without diagnosis of hypertension: Secondary | ICD-10-CM | POA: Diagnosis not present

## 2022-10-17 DIAGNOSIS — M51369 Other intervertebral disc degeneration, lumbar region without mention of lumbar back pain or lower extremity pain: Secondary | ICD-10-CM | POA: Diagnosis not present

## 2022-10-22 DIAGNOSIS — Z79899 Other long term (current) drug therapy: Secondary | ICD-10-CM | POA: Diagnosis not present

## 2022-10-29 DIAGNOSIS — E11621 Type 2 diabetes mellitus with foot ulcer: Secondary | ICD-10-CM | POA: Diagnosis not present

## 2022-10-29 DIAGNOSIS — E119 Type 2 diabetes mellitus without complications: Secondary | ICD-10-CM | POA: Diagnosis not present

## 2022-10-29 DIAGNOSIS — L97509 Non-pressure chronic ulcer of other part of unspecified foot with unspecified severity: Secondary | ICD-10-CM | POA: Diagnosis not present

## 2022-10-29 DIAGNOSIS — L03119 Cellulitis of unspecified part of limb: Secondary | ICD-10-CM | POA: Diagnosis not present

## 2022-10-30 ENCOUNTER — Encounter: Payer: Self-pay | Admitting: Podiatry

## 2022-10-30 ENCOUNTER — Ambulatory Visit: Payer: Medicare Other | Admitting: Podiatry

## 2022-10-30 DIAGNOSIS — E1142 Type 2 diabetes mellitus with diabetic polyneuropathy: Secondary | ICD-10-CM | POA: Diagnosis not present

## 2022-10-30 DIAGNOSIS — T25231A Burn of second degree of right toe(s) (nail), initial encounter: Secondary | ICD-10-CM

## 2022-10-30 DIAGNOSIS — L97512 Non-pressure chronic ulcer of other part of right foot with fat layer exposed: Secondary | ICD-10-CM | POA: Diagnosis not present

## 2022-10-30 MED ORDER — AMOXICILLIN-POT CLAVULANATE 875-125 MG PO TABS: 1.0000 | ORAL_TABLET | Freq: Two times a day (BID) | ORAL | 0 refills | Status: DC

## 2022-10-30 MED ORDER — SILVER SULFADIAZINE 1 % EX CREA: 1.0000 | TOPICAL_CREAM | Freq: Every day | CUTANEOUS | 0 refills | Status: DC
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
## 2022-11-13 ENCOUNTER — Ambulatory Visit (INDEPENDENT_AMBULATORY_CARE_PROVIDER_SITE_OTHER): Payer: Medicare Other | Admitting: Podiatry

## 2022-11-13 DIAGNOSIS — Z91199 Patient's noncompliance with other medical treatment and regimen due to unspecified reason: Secondary | ICD-10-CM

## 2022-11-13 NOTE — Progress Notes (Signed)
 Patient absent for apointment

## 2022-11-14 ENCOUNTER — Telehealth: Payer: Self-pay | Admitting: Podiatry

## 2022-11-14 NOTE — Telephone Encounter (Signed)
Patient missed appointment due to having to bury his parents.  Is asking if he can get another refill of his antibiotic.  I rescheduled his appointment.

## 2022-11-17 DIAGNOSIS — Z79899 Other long term (current) drug therapy: Secondary | ICD-10-CM | POA: Diagnosis not present

## 2022-11-17 DIAGNOSIS — L97509 Non-pressure chronic ulcer of other part of unspecified foot with unspecified severity: Secondary | ICD-10-CM | POA: Diagnosis not present

## 2022-11-17 DIAGNOSIS — D508 Other iron deficiency anemias: Secondary | ICD-10-CM | POA: Diagnosis not present

## 2022-11-17 DIAGNOSIS — I10 Essential (primary) hypertension: Secondary | ICD-10-CM | POA: Diagnosis not present

## 2022-11-17 DIAGNOSIS — E559 Vitamin D deficiency, unspecified: Secondary | ICD-10-CM | POA: Diagnosis not present

## 2022-11-17 DIAGNOSIS — L03119 Cellulitis of unspecified part of limb: Secondary | ICD-10-CM | POA: Diagnosis not present

## 2022-11-17 DIAGNOSIS — M51369 Other intervertebral disc degeneration, lumbar region without mention of lumbar back pain or lower extremity pain: Secondary | ICD-10-CM | POA: Diagnosis not present

## 2022-11-17 DIAGNOSIS — E7800 Pure hypercholesterolemia, unspecified: Secondary | ICD-10-CM | POA: Diagnosis not present

## 2022-11-17 DIAGNOSIS — E119 Type 2 diabetes mellitus without complications: Secondary | ICD-10-CM | POA: Diagnosis not present

## 2022-11-17 DIAGNOSIS — M5030 Other cervical disc degeneration, unspecified cervical region: Secondary | ICD-10-CM | POA: Diagnosis not present

## 2022-11-17 DIAGNOSIS — E11621 Type 2 diabetes mellitus with foot ulcer: Secondary | ICD-10-CM | POA: Diagnosis not present

## 2022-11-18 ENCOUNTER — Telehealth: Payer: Self-pay | Admitting: Podiatry

## 2022-11-18 ENCOUNTER — Emergency Department (HOSPITAL_BASED_OUTPATIENT_CLINIC_OR_DEPARTMENT_OTHER): Payer: Medicare Other

## 2022-11-18 ENCOUNTER — Other Ambulatory Visit: Payer: Self-pay

## 2022-11-18 ENCOUNTER — Encounter (HOSPITAL_BASED_OUTPATIENT_CLINIC_OR_DEPARTMENT_OTHER): Payer: Self-pay | Admitting: Emergency Medicine

## 2022-11-18 ENCOUNTER — Emergency Department (HOSPITAL_BASED_OUTPATIENT_CLINIC_OR_DEPARTMENT_OTHER)
Admission: EM | Admit: 2022-11-18 | Discharge: 2022-11-18 | Disposition: A | Discharge status: Home / Self Care | Payer: Medicare Other | Attending: Emergency Medicine | Admitting: Emergency Medicine

## 2022-11-18 DIAGNOSIS — I10 Essential (primary) hypertension: Secondary | ICD-10-CM | POA: Diagnosis not present

## 2022-11-18 DIAGNOSIS — S91301A Unspecified open wound, right foot, initial encounter: Secondary | ICD-10-CM | POA: Diagnosis not present

## 2022-11-18 DIAGNOSIS — E1165 Type 2 diabetes mellitus with hyperglycemia: Secondary | ICD-10-CM | POA: Insufficient documentation

## 2022-11-18 DIAGNOSIS — E1140 Type 2 diabetes mellitus with diabetic neuropathy, unspecified: Secondary | ICD-10-CM | POA: Diagnosis not present

## 2022-11-18 DIAGNOSIS — M7989 Other specified soft tissue disorders: Secondary | ICD-10-CM | POA: Diagnosis present

## 2022-11-18 DIAGNOSIS — Z79899 Other long term (current) drug therapy: Secondary | ICD-10-CM | POA: Insufficient documentation

## 2022-11-18 DIAGNOSIS — E11621 Type 2 diabetes mellitus with foot ulcer: Secondary | ICD-10-CM | POA: Insufficient documentation

## 2022-11-18 DIAGNOSIS — Z794 Long term (current) use of insulin: Secondary | ICD-10-CM | POA: Insufficient documentation

## 2022-11-18 DIAGNOSIS — L97519 Non-pressure chronic ulcer of other part of right foot with unspecified severity: Secondary | ICD-10-CM | POA: Insufficient documentation

## 2022-11-18 LAB — CBC WITH DIFFERENTIAL/PLATELET
Abs Immature Granulocytes: 0.04 10*3/uL (ref 0.00–0.07)
Basophils Absolute: 0.1 10*3/uL (ref 0.0–0.1)
Basophils Relative: 1 %
Eosinophils Absolute: 0.1 10*3/uL (ref 0.0–0.5)
Eosinophils Relative: 2 %
HCT: 37 % — ABNORMAL LOW (ref 39.0–52.0)
Hemoglobin: 13.3 g/dL (ref 13.0–17.0)
Immature Granulocytes: 1 %
Lymphocytes Relative: 18 %
Lymphs Abs: 1.4 10*3/uL (ref 0.7–4.0)
MCH: 28.1 pg (ref 26.0–34.0)
MCHC: 35.9 g/dL (ref 30.0–36.0)
MCV: 78.1 fL — ABNORMAL LOW (ref 80.0–100.0)
Monocytes Absolute: 0.3 10*3/uL (ref 0.1–1.0)
Monocytes Relative: 4 %
Neutro Abs: 5.8 10*3/uL (ref 1.7–7.7)
Neutrophils Relative %: 74 %
Platelets: 235 10*3/uL (ref 150–400)
RBC: 4.74 MIL/uL (ref 4.22–5.81)
RDW: 13.2 % (ref 11.5–15.5)
WBC: 7.7 10*3/uL (ref 4.0–10.5)
nRBC: 0 % (ref 0.0–0.2)

## 2022-11-18 LAB — BASIC METABOLIC PANEL
Anion gap: 11 (ref 5–15)
BUN: 18 mg/dL (ref 8–23)
CO2: 26 mmol/L (ref 22–32)
Calcium: 9.9 mg/dL (ref 8.9–10.3)
Chloride: 92 mmol/L — ABNORMAL LOW (ref 98–111)
Creatinine, Ser: 0.84 mg/dL (ref 0.61–1.24)
GFR, Estimated: >60 mL/min (ref >60)
Glucose, Bld: 330 mg/dL — ABNORMAL HIGH (ref 70–99)
Potassium: 4.3 mmol/L (ref 3.5–5.1)
Sodium: 129 mmol/L — ABNORMAL LOW (ref 135–145)

## 2022-11-18 MED ORDER — AMOXICILLIN-POT CLAVULANATE 875-125 MG PO TABS: 1.0000 | ORAL_TABLET | Freq: Two times a day (BID) | ORAL | 0 refills | Status: DC

## 2022-11-18 NOTE — ED Notes (Signed)
Patient's foot wrapped with non-adherent dressing, gauze, and kerlex.
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
## 2022-11-18 NOTE — Discharge Instructions (Signed)
Call Dr. Higinio Plan office in the morning to schedule a recheck appointment. He advises we restart your antibiotics and this has been called to your pharmacy.
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
## 2022-11-19 DIAGNOSIS — Z79899 Other long term (current) drug therapy: Secondary | ICD-10-CM | POA: Diagnosis not present

## 2022-11-20 ENCOUNTER — Ambulatory Visit (INDEPENDENT_AMBULATORY_CARE_PROVIDER_SITE_OTHER): Payer: Medicare Other | Admitting: Podiatry

## 2022-11-20 ENCOUNTER — Encounter: Payer: Self-pay | Admitting: Podiatry

## 2022-11-20 DIAGNOSIS — E1142 Type 2 diabetes mellitus with diabetic polyneuropathy: Secondary | ICD-10-CM

## 2022-11-20 DIAGNOSIS — I96 Gangrene, not elsewhere classified: Secondary | ICD-10-CM

## 2022-11-20 DIAGNOSIS — L97512 Non-pressure chronic ulcer of other part of right foot with fat layer exposed: Secondary | ICD-10-CM | POA: Diagnosis not present
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
## 2022-11-21 ENCOUNTER — Encounter (HOSPITAL_COMMUNITY): Payer: Self-pay

## 2022-11-21 ENCOUNTER — Other Ambulatory Visit: Payer: Self-pay

## 2022-11-21 ENCOUNTER — Telehealth: Payer: Self-pay

## 2022-11-21 ENCOUNTER — Observation Stay (HOSPITAL_COMMUNITY)
Admission: EM | Admit: 2022-11-21 | Discharge: 2022-11-24 | Disposition: A | Discharge status: Still In Hospital | Payer: Medicare Other | Attending: Internal Medicine | Admitting: Internal Medicine

## 2022-11-21 DIAGNOSIS — I10 Essential (primary) hypertension: Secondary | ICD-10-CM | POA: Insufficient documentation

## 2022-11-21 DIAGNOSIS — E119 Type 2 diabetes mellitus without complications: Secondary | ICD-10-CM | POA: Diagnosis not present

## 2022-11-21 DIAGNOSIS — L089 Local infection of the skin and subcutaneous tissue, unspecified: Secondary | ICD-10-CM | POA: Diagnosis not present

## 2022-11-21 DIAGNOSIS — E871 Hypo-osmolality and hyponatremia: Secondary | ICD-10-CM | POA: Diagnosis not present

## 2022-11-21 DIAGNOSIS — E1142 Type 2 diabetes mellitus with diabetic polyneuropathy: Secondary | ICD-10-CM | POA: Diagnosis not present

## 2022-11-21 DIAGNOSIS — G4730 Sleep apnea, unspecified: Secondary | ICD-10-CM | POA: Diagnosis present

## 2022-11-21 DIAGNOSIS — G894 Chronic pain syndrome: Secondary | ICD-10-CM | POA: Diagnosis present

## 2022-11-21 DIAGNOSIS — F3181 Bipolar II disorder: Secondary | ICD-10-CM | POA: Diagnosis present

## 2022-11-21 DIAGNOSIS — I96 Gangrene, not elsewhere classified: Principal | ICD-10-CM | POA: Diagnosis present

## 2022-11-21 DIAGNOSIS — K219 Gastro-esophageal reflux disease without esophagitis: Secondary | ICD-10-CM | POA: Diagnosis not present

## 2022-11-21 DIAGNOSIS — Z79899 Other long term (current) drug therapy: Secondary | ICD-10-CM | POA: Insufficient documentation

## 2022-11-21 DIAGNOSIS — F1019 Alcohol abuse with unspecified alcohol-induced disorder: Secondary | ICD-10-CM | POA: Diagnosis not present

## 2022-11-21 DIAGNOSIS — F411 Generalized anxiety disorder: Secondary | ICD-10-CM | POA: Diagnosis present

## 2022-11-21 DIAGNOSIS — Z794 Long term (current) use of insulin: Secondary | ICD-10-CM | POA: Diagnosis not present

## 2022-11-21 DIAGNOSIS — E876 Hypokalemia: Secondary | ICD-10-CM | POA: Diagnosis present

## 2022-11-21 DIAGNOSIS — E785 Hyperlipidemia, unspecified: Secondary | ICD-10-CM | POA: Diagnosis not present

## 2022-11-21 DIAGNOSIS — E782 Mixed hyperlipidemia: Secondary | ICD-10-CM | POA: Diagnosis present

## 2022-11-21 DIAGNOSIS — F32A Depression, unspecified: Secondary | ICD-10-CM | POA: Diagnosis present

## 2022-11-21 LAB — COMPREHENSIVE METABOLIC PANEL
ALT: 23 U/L (ref 0–44)
AST: 28 U/L (ref 15–41)
Albumin: 3.6 g/dL (ref 3.5–5.0)
Alkaline Phosphatase: 60 U/L (ref 38–126)
Anion gap: 12 (ref 5–15)
BUN: 16 mg/dL (ref 8–23)
CO2: 23 mmol/L (ref 22–32)
Calcium: 9 mg/dL (ref 8.9–10.3)
Chloride: 95 mmol/L — ABNORMAL LOW (ref 98–111)
Creatinine, Ser: 1 mg/dL (ref 0.61–1.24)
GFR, Estimated: >60 mL/min (ref >60)
Glucose, Bld: 319 mg/dL — ABNORMAL HIGH (ref 70–99)
Potassium: 3.5 mmol/L (ref 3.5–5.1)
Sodium: 130 mmol/L — ABNORMAL LOW (ref 135–145)
Total Bilirubin: 0.8 mg/dL (ref <1.2)
Total Protein: 7 g/dL (ref 6.5–8.1)

## 2022-11-21 LAB — LACTIC ACID, PLASMA: Lactic Acid, Venous: 2.6 mmol/L (ref 0.5–1.9)

## 2022-11-21 LAB — CBC WITH DIFFERENTIAL/PLATELET
Abs Immature Granulocytes: 0.02 10*3/uL (ref 0.00–0.07)
Basophils Absolute: 0 10*3/uL (ref 0.0–0.1)
Basophils Relative: 1 %
Eosinophils Absolute: 0.1 10*3/uL (ref 0.0–0.5)
Eosinophils Relative: 1 %
HCT: 35.5 % — ABNORMAL LOW (ref 39.0–52.0)
Hemoglobin: 12.1 g/dL — ABNORMAL LOW (ref 13.0–17.0)
Immature Granulocytes: 0 %
Lymphocytes Relative: 17 %
Lymphs Abs: 1.2 10*3/uL (ref 0.7–4.0)
MCH: 26.8 pg (ref 26.0–34.0)
MCHC: 34.1 g/dL (ref 30.0–36.0)
MCV: 78.7 fL — ABNORMAL LOW (ref 80.0–100.0)
Monocytes Absolute: 0.4 10*3/uL (ref 0.1–1.0)
Monocytes Relative: 5 %
Neutro Abs: 5.2 10*3/uL (ref 1.7–7.7)
Neutrophils Relative %: 76 %
Platelets: 186 10*3/uL (ref 150–400)
RBC: 4.51 MIL/uL (ref 4.22–5.81)
RDW: 12.9 % (ref 11.5–15.5)
WBC: 6.8 10*3/uL (ref 4.0–10.5)
nRBC: 0 % (ref 0.0–0.2)

## 2022-11-21 LAB — GLUCOSE, CAPILLARY
Glucose-Capillary: 231 mg/dL — ABNORMAL HIGH (ref 70–99)
Glucose-Capillary: 247 mg/dL — ABNORMAL HIGH (ref 70–99)

## 2022-11-21 MED ORDER — INSULIN ASPART 100 UNIT/ML IJ SOLN
0.0000 [IU] | Freq: Three times a day (TID) | INTRAMUSCULAR | Status: DC
Start: 2022-11-22 — End: 2022-11-24
  Administered 2022-11-22: 3 [IU] via SUBCUTANEOUS
  Administered 2022-11-22: 8 [IU] via SUBCUTANEOUS
  Administered 2022-11-23: 3 [IU] via SUBCUTANEOUS
  Administered 2022-11-23: 8 [IU] via SUBCUTANEOUS
  Administered 2022-11-23: 5 [IU] via SUBCUTANEOUS
  Administered 2022-11-24: 3 [IU] via SUBCUTANEOUS

## 2022-11-21 MED ORDER — ONDANSETRON HCL 4 MG/2ML IJ SOLN
4.0000 mg | Freq: Once | INTRAMUSCULAR | Status: AC
  Administered 2022-11-21: 4 mg via INTRAVENOUS
  Filled 2022-11-21: qty 2

## 2022-11-21 MED ORDER — ONDANSETRON HCL 4 MG/2ML IJ SOLN
INTRAMUSCULAR | Status: AC
  Filled 2022-11-21: qty 2

## 2022-11-21 MED ORDER — FOLIC ACID 1 MG PO TABS
1.0000 mg | ORAL_TABLET | Freq: Every day | ORAL | Status: DC
  Administered 2022-11-21 – 2022-11-24 (×4): 1 mg via ORAL
  Filled 2022-11-21 (×4): qty 1

## 2022-11-21 MED ORDER — ACETAMINOPHEN 325 MG PO TABS: 650.0000 mg | ORAL_TABLET | Freq: Four times a day (QID) | ORAL | Status: DC | PRN

## 2022-11-21 MED ORDER — LACTATED RINGERS IV BOLUS
1000.0000 mL | Freq: Once | INTRAVENOUS | Status: AC
  Administered 2022-11-21: 1000 mL via INTRAVENOUS

## 2022-11-21 MED ORDER — BUPRENORPHINE HCL-NALOXONE HCL 8-2 MG SL SUBL
1.0000 | SUBLINGUAL_TABLET | Freq: Three times a day (TID) | SUBLINGUAL | Status: DC
  Administered 2022-11-23: 1 via SUBLINGUAL
  Filled 2022-11-21 (×2): qty 1

## 2022-11-21 MED ORDER — SODIUM CHLORIDE 0.9 % IV SOLN
1.0000 g | Freq: Once | INTRAVENOUS | Status: AC
  Administered 2022-11-21: 1 g via INTRAVENOUS
  Filled 2022-11-21: qty 10

## 2022-11-21 MED ORDER — PANTOPRAZOLE SODIUM 40 MG PO TBEC: 40.0000 mg | DELAYED_RELEASE_TABLET | Freq: Every day | ORAL | Status: DC

## 2022-11-21 MED ORDER — SODIUM CHLORIDE 0.9% FLUSH
3.0000 mL | Freq: Two times a day (BID) | INTRAVENOUS | Status: DC
  Administered 2022-11-21 – 2022-11-23 (×5): 3 mL via INTRAVENOUS

## 2022-11-21 MED ORDER — QUETIAPINE FUMARATE 25 MG PO TABS
150.0000 mg | ORAL_TABLET | Freq: Every day | ORAL | Status: DC
  Administered 2022-11-21 – 2022-11-23 (×3): 150 mg via ORAL
  Filled 2022-11-21 (×3): qty 2

## 2022-11-21 MED ORDER — ONDANSETRON HCL 4 MG/2ML IJ SOLN
4.0000 mg | Freq: Three times a day (TID) | INTRAMUSCULAR | Status: DC | PRN
  Administered 2022-11-21: 4 mg via INTRAVENOUS
  Filled 2022-11-21: qty 2

## 2022-11-21 MED ORDER — LOPERAMIDE HCL 2 MG PO CAPS: 4.0000 mg | ORAL_CAPSULE | Freq: Three times a day (TID) | ORAL | Status: DC | PRN

## 2022-11-21 MED ORDER — VANCOMYCIN HCL 1750 MG/350ML IV SOLN
1750.0000 mg | Freq: Once | INTRAVENOUS | Status: AC
  Administered 2022-11-21: 1750 mg via INTRAVENOUS
  Filled 2022-11-21: qty 350

## 2022-11-21 MED ORDER — POLYETHYLENE GLYCOL 3350 17 G PO PACK: 17.0000 g | PACK | Freq: Every day | ORAL | Status: DC | PRN

## 2022-11-21 MED ORDER — THIAMINE MONONITRATE 100 MG PO TABS
100.0000 mg | ORAL_TABLET | Freq: Every day | ORAL | Status: DC
  Administered 2022-11-21 – 2022-11-24 (×4): 100 mg via ORAL
  Filled 2022-11-21 (×4): qty 1

## 2022-11-21 MED ORDER — ACETAMINOPHEN 650 MG RE SUPP: 650.0000 mg | Freq: Four times a day (QID) | RECTAL | Status: DC | PRN

## 2022-11-21 NOTE — H&P (Signed)
History and Physical   [name] [name]:[id number] DOB: 1960-12-01 DOA: 11/21/2022  PCP: Saundra Shelling, NP   Patient coming from: Home  Chief Complaint: Worsening right foot wounds  HPI: [name] [name] is a 62 y.o. male with medical history significant of hypertension, hyperlipidemia, diabetes, neuropathy, GERD, OSA, alcohol use with history of withdrawal, bipolar disorder, depression, anxiety, chronic pain presenting with worsening foot wound.  Patient reports that he developed foot wounds back in October.  He fell asleep with his foot to close to a space heater and woke up with blisters on his foot.  He was seen in follow-up in the ED on 11/6 after worsening foot changes including discoloration and was started on Augmentin and follow-up was arranged with podiatry outpatient.  He did see podiatry yesterday and after further investigation noted to have significant ulceration of multiple toes with gangrenous changes and cyanosis of the fifth toe.  Denies fevers, chills, chest pain, shortness of breath, abdominal pain, constipation, diarrhea, nausea, vomiting.  ED Course: Vital signs in the ED notable for heart rate in the upper 50s.  Lab workup included CMP with sodium 130, chloride 95, glucose 319.  CBC with hemoglobin stable 12.1.  Lactic acid mildly elevated 2.6 with repeat pending.  Blood cultures pending.  Patient received vancomycin, ceftriaxone, liter IV fluids, Zofran in the ED.  EDP spoke with podiatry on-call to confirm plan for admission today and surgical intervention tomorrow and they will see the patient tomorrow.  Review of Systems: As per HPI otherwise all other systems reviewed and are negative.  Past Medical History:  Diagnosis Date   Anxiety    Arthritis    Bipolar 1 disorder (HCC)    Blood transfusion without reported diagnosis    Cataract    Chronic pain    Depressed bipolar disorder (HCC)    Depression    Diabetes mellitus without complication (HCC)     Diabetic neuropathy (HCC)    GERD (gastroesophageal reflux disease)    Herpes zoster 12/05/2008   Qualifier: Diagnosis of  By: Yetta Barre MD, Bernadene Bell.    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  Coupled with high doses of loperamide used for pain control.  Unintentional overdose -intractable VT leading to cardiogenic shock - ECMO   Hyperlipidemia    Hypertension    Neuromuscular disorder (HCC)    nerve damage back, neck, and shoulder   Neuropathy in diabetes (HCC)    Sleep apnea    has CPAP but cannot use it   Substance abuse (HCC)    in past     Past Surgical History:  Procedure Laterality Date   COLONOSCOPY     EXTRACORPOREAL CIRCULATION  11/2015   FOR Cardiogenic shock related to intractable [place] VT storm (prolonged QT from drug toxixcity)    INTRAOPERATIVE TRANSESOPHAGEAL ECHOCARDIOGRAM N/A 11/26/2016   Procedure: INTRAOPERATIVE TRANSESOPHAGEAL ECHOCARDIOGRAM;  Surgeon: Kerin Perna, MD;  Location: [facility] OR;  Service: Open Heart Surgery;  Laterality: N/A;   IR ANGIOGRAM EXTREMITY LEFT  06/05/2020   IR PTA NON CORO-LOWER EXTREM  06/05/2020   IR RADIOLOGIST EVAL & MGMT  05/21/2020   IR US GUIDE VASC ACCESS LEFT  06/05/2020   POLYPECTOMY     SHOULDER ARTHROSCOPY WITH ROTATOR CUFF REPAIR Right 2009   SPINE SURGERY  2010   TRANSTHORACIC ECHOCARDIOGRAM  07/2016; 12/10/2016   a. Prior to VT arrest: normal. EF 55-60%.  Gr 1 DD.  mild LVH. Mildly dilated Aortic Root.;; b.  Normal LV size and function.  Mild LVH.  EF 55%.  No RWMA.  No valve abnormalities.    Social History  reports that he has never smoked. He has never used smokeless tobacco. He reports current alcohol use. He reports that he does not use drugs.  Allergies  Allergen Reactions   Heparin Other (See Comments)    HIT Ab negative on 02/20/15, but SRA POSITIVE    Oxytetracycline Rash and Other (See Comments)   Del-Mycin  [Erythromycin] Other (See Comments)    All "mycin drugs" - unknown reaction   Sulfa Antibiotics Other (See Comments)    Unknown reaction    Family History  Problem Relation Age of Onset   Diabetes Mother    Hyperlipidemia Mother    Heart disease Father    Colon cancer Neg Hx    Colon polyps Neg Hx    Esophageal cancer Neg Hx    Rectal cancer Neg Hx    Stomach cancer Neg Hx   Reviewed on admission  Prior to Admission medications   Medication Sig Start Date End Date Taking? Authorizing Provider  amLODipine (NORVASC) 10 MG tablet Take 1 tablet (10 mg total) by mouth daily. 08/09/21   Marguerita Merles Latif, DO  amoxicillin-clavulanate (AUGMENTIN) 875-125 MG tablet Take 1 tablet by mouth 2 (two) times daily. 11/18/22   Elpidio Anis, PA-C  blood glucose meter kit and supplies KIT Dispense based on patient and insurance preference. Use up to four times daily as directed. 07/03/21   Azucena Fallen, MD  feeding supplement, GLUCERNA SHAKE, (GLUCERNA SHAKE) LIQD Take 237 mLs by mouth 3 (three) times daily between meals. 08/08/21   Marguerita Merles Latif, DO  FLUoxetine (PROZAC) 20 MG capsule Take 1 capsule (20 mg total) by mouth daily. 08/08/21   Marguerita Merles Latif, DO  folic acid (FOLVITE) 1 MG tablet Take 1 tablet (1 mg total) by mouth daily. 08/09/21   Marguerita Merles Latif, DO  gabapentin ([name]) 300 MG capsule Take 1 capsule (300 mg total) by mouth 2 (two) times daily. 08/08/21   Marguerita Merles Latif, DO  hydrALAZINE (APRESOLINE) 25 MG tablet Take 3 tablets (75 mg total) by mouth every 8 (eight) hours. 08/08/21   Marguerita Merles Latif, DO  insulin glargine-yfgn (SEMGLEE) 100 UNIT/ML injection Inject 0.15 mLs (15 Units total) into the skin daily. 08/08/21   Marguerita Merles Latif, DO  lidocaine (LIDODERM) 5 % Place 1 patch onto the skin daily. Remove & Discard patch within 12 hours or as directed by MD 08/09/21   Merlene Laughter, DO  loperamide (IMODIUM) 2 MG capsule Take 2 capsules (4 mg total) by  mouth 3 (three) times daily as needed for diarrhea or loose stools. 08/08/21   Marguerita Merles Latif, DO  magnesium oxide (MAG-OX) 400 (240 Mg) MG tablet Take 1 tablet (400 mg total) by mouth daily. 08/09/21   Marguerita Merles Latif, DO  Multiple Vitamin (MULTIVITAMIN WITH MINERALS) TABS tablet Take 1 tablet by mouth daily. 08/09/21   Marguerita Merles Latif, DO  pantoprazole (PROTONIX) 40 MG tablet Take 1 tablet (40 mg total) by mouth daily. 08/09/21   Marguerita Merles Latif, DO  silver sulfADIAZINE (SILVADENE) 1 % cream Apply 1 Application topically daily. 10/30/22   Standiford, Jenelle Mages, DPM  thiamine (VITAMIN B1) 100 MG tablet Take 1 tablet (100 mg total) by mouth daily. 08/09/21   Merlene Laughter, DO    Physical Exam:  Vitals:   11/21/22 1305 11/21/22 1325 11/21/22 1725 11/21/22 1743  BP: 134/67  125/72   Pulse: (!) 58  (!) 59   Resp: 20  13   Temp: 98.4 F (36.9 C)   98.4 F (36.9 C)  TempSrc: Oral   Oral  SpO2: 100%  100%   Weight:  106.1 kg    Height:  6\' 2"  (1.88 m)      Physical Exam Constitutional:      General: He is not in acute distress.    Appearance: Normal appearance.  HENT:     Head: Normocephalic and atraumatic.     Mouth/Throat:     Mouth: Mucous membranes are moist.     Pharynx: Oropharynx is clear.  Eyes:     Extraocular Movements: Extraocular movements intact.     Pupils: Pupils are equal, round, and reactive to light.  Cardiovascular:     Rate and Rhythm: Normal rate and regular rhythm.     Pulses: Normal pulses.     Heart sounds: Normal heart sounds.  Pulmonary:     Effort: Pulmonary effort is normal. No respiratory distress.     Breath sounds: Normal breath sounds.  Abdominal:     General: Bowel sounds are normal. There is no distension.     Palpations: Abdomen is soft.     Tenderness: There is no abdominal tenderness.  Musculoskeletal:        General: No swelling or deformity.     Comments: Bandaged R foot, see previous images in chart  Skin:     General: Skin is warm and dry.  Neurological:     General: No focal deficit present.     Mental Status: Mental status is at baseline.    Labs on Admission: I have personally reviewed following labs and imaging studies  CBC: Recent Labs  Lab 11/18/22 1613 11/21/22 1333  WBC 7.7 6.8  NEUTROABS 5.8 5.2  HGB 13.3 12.1*  HCT 37.0* 35.5*  MCV 78.1* 78.7*  PLT 235 186    Basic Metabolic Panel: Recent Labs  Lab 11/18/22 1613 11/21/22 1333  NA 129* 130*  K 4.3 3.5  CL 92* 95*  CO2 26 23  GLUCOSE 330* 319*  BUN 18 16  CREATININE 0.84 1.00  CALCIUM 9.9 9.0    GFR: Estimated Creatinine Clearance: 100.7 mL/min (by C-G formula based on SCr of 1 mg/dL).  Liver Function Tests: Recent Labs  Lab 11/21/22 1333  AST 28  ALT 23  ALKPHOS 60  BILITOT 0.8  PROT 7.0  ALBUMIN 3.6    Urine analysis:    Component Value Date/Time   COLORURINE YELLOW 08/01/2021 2206   APPEARANCEUR CLEAR 08/01/2021 2206   LABSPEC 1.028 08/01/2021 2206   PHURINE 6.0 08/01/2021 2206   GLUCOSEU >=500 (A) 08/01/2021 2206   HGBUR MODERATE (A) 08/01/2021 2206   BILIRUBINUR NEGATIVE 08/01/2021 2206   KETONESUR 20 (A) 08/01/2021 2206   PROTEINUR 100 (A) 08/01/2021 2206   UROBILINOGEN 0.2 05/18/2008 1401   NITRITE NEGATIVE 08/01/2021 2206   LEUKOCYTESUR NEGATIVE 08/01/2021 2206    Radiological Exams on Admission: No results found.  EKG: Not performed in emergency department  Assessment/Plan Principal Problem:   Gangrene of right foot (HCC) Active Problems:   Type 2 diabetes mellitus with diabetic polyneuropathy (HCC)   Essential hypertension   Mixed hyperlipidemia   Depression   GERD   Sleep apnea   Chronic pain disorder   Dyslipidemia   Generalized anxiety disorder   Bipolar  II disorder (HCC)   Alcohol abuse with alcohol-induced disorder (HCC)   Gangrene of the right foot > As per HPI in October he fell asleep with his foot to place to his space heater and woke up with a blistered  foot.  This gradually worsened and became more discolored and he was seen in the ED on 11/6 and was discharged with Augmentin and podiatry follow-up which was done yesterday.  Unfortunately an ulcer had progressed to the point of gangrene with nonviable tissue and patient is presenting today for continued antibiotics and amputation tomorrow. > Podiatry notified of patient presentation in ED and plan to see patient for amputation tomorrow. - Monitor on telemetry overnight - A dose of vancomycin and ceftriaxone were ordered in the ED.  He will have 24 hours of coverage if further antibiotics needed after surgical intervention these can be reordered. - Appreciate podiatry recommendations and assistance - Trend fever curve and WBC - Follow-up blood cultures - Follow-up repeat lactic acid - Will be taking his home Suboxone while admitted, avoid opioids for acute pain, consider Toradol for breakthrough pain if needed  Hyponatremia > Mild hyponatremia 130 has low normal sodium at baseline. > Did receive 1 L IV fluids in the ED as above.  Will monitor response to this. - Trend renal function and electrolytes  Hypertension - Continue home amlodipine, hydralazine  Diabetes > Prescribed 15 units daily, but has not yet picked this up / started this. - SSI  Neuropathy - Continue home gabapentin  GERD - Continue home PPI  Bipolar disorder Depression Anxiety - Continue home fluoxetine  Chronic pain > On chronic Suboxone 8-2 3 times daily. - Continue home Suboxone - Avoid opioids for acute pain  Alcohol use disorder with history of withdrawal - Continue home thiamine and folate  DVT prophylaxis: SCDs for now Code Status:   Full Family Communication:  Updated at bedside Disposition Plan:   Patient is from:  Home  Anticipated DC to:  Home  Anticipated DC date:  1 to 3 days  Anticipated DC barriers: None  Consults called:  Podiatry consulted by the ED and will see the patient tomorrow  for amputation Admission status:  Observation, telemetry  Severity of Illness: The appropriate patient status for this patient is OBSERVATION. Observation status is judged to be reasonable and necessary in order to provide the required intensity of service to ensure the patient's safety. The patient's presenting symptoms, physical exam findings, and initial radiographic and laboratory data in the context of their medical condition is felt to place them at decreased risk for further clinical deterioration. Furthermore, it is anticipated that the patient will be medically stable for discharge from the hospital within 2 midnights of admission.    Synetta Fail MD [facility]  How to contact the [facility] Attending or Consulting provider 7A - 7P or covering provider during after hours 7P -7A, for this patient?   Check the care team in [facility] and look for a) attending/consulting [facility] provider listed and b) the [facility] team listed Log into [email or web address] and use [place]'s universal password to access. If you do not have the password, please contact the hospital operator. Locate the [facility] provider you are looking for under [facility] and page to a number that you can be directly reached. If you still have difficulty reaching the provider, please page the [facility] (Director on Call) for the Hospitalists listed on amion for assistance.  11/21/2022, 5:55 PM

## 2022-11-21 NOTE — Progress Notes (Signed)
ED Pharmacy Antibiotic Sign Off An antibiotic consult was received from an ED provider for vancomycin per pharmacy dosing for wound infection. A chart review was completed to assess appropriateness.  The following one time order(s) were placed:  Vancomycin 1750 mg x1   Further antibiotic and/or antibiotic pharmacy consults should be ordered by the admitting provider if indicated.   Thank you for allowing pharmacy to be a part of this patient's care.   Rutherford Nail, PharmD PGY2 Critical Care Pharmacy Resident 11/21/22 5:17 PM

## 2022-11-21 NOTE — ED Provider Triage Note (Signed)
Emergency Medicine Provider Triage Evaluation Note  [name] [name] , a 62 y.o. male  was evaluated in triage.  Pt complains of patient reports his podiatrist told him to come to the ER so that he can get his right fifth toe amputated tomorrow with the on-call provider.  Reports he has diabetic nephropathy and sustained a burn to his toes which is not healing.  Review of Systems  Positive: As above Negative: As above  Physical Exam  BP 134/67 (BP Location: Left Arm)   Pulse (!) 58   Temp 98.4 F (36.9 C) (Oral)   Resp 20   Ht 6\' 2"  (1.88 m)   Wt 106.1 kg   SpO2 100%   BMI 30.04 kg/m  Gen:   Awake, no distress   Resp:  Normal effort  MSK:   Moves extremities without difficulty  Other:  Significant ulceration to the right 1st through 5th digits  Medical Decision Making  Medically screening exam initiated at 1:27 PM.  Appropriate orders placed.  [name] [name] was informed that the remainder of the evaluation will be completed by another provider, this initial triage assessment does not replace that evaluation, and the importance of remaining in the ED until their evaluation is complete.  According to podiatry note from 11/19/2022: "he will proceed to the ER tomorrow to be admitted for surgery on Saturday with our on-call provider, I did recommend antibiotics to continue and on admission should have antibiotics as well"   Arabella Merles, PA-C 11/21/22 1327

## 2022-11-21 NOTE — ED Provider Notes (Signed)
[name] Provider Note   CSN: [id number] Arrival date & time: 11/21/22  1234     History  Chief Complaint  Patient presents with   Recurrent Skin Infections    [name] [name] is a 62 y.o. male.  62 year old male presents today for concern of diabetic foot infection to the right small toe.  Seen outpatient podiatry.  Plan to have an amputation done tomorrow and requires admission and antibiotics.  No other complaints.  No fever.  The history is provided by the patient and medical records. No language interpreter was used.       Home Medications Prior to Admission medications   Medication Sig Start Date End Date Taking? Authorizing Provider  amLODipine (NORVASC) 10 MG tablet Take 1 tablet (10 mg total) by mouth daily. 08/09/21   Marguerita Merles Latif, DO  amoxicillin-clavulanate (AUGMENTIN) 875-125 MG tablet Take 1 tablet by mouth 2 (two) times daily. 11/18/22   Elpidio Anis, PA-C  blood glucose meter kit and supplies KIT Dispense based on patient and insurance preference. Use up to four times daily as directed. 07/03/21   Azucena Fallen, MD  feeding supplement, GLUCERNA SHAKE, (GLUCERNA SHAKE) LIQD Take 237 mLs by mouth 3 (three) times daily between meals. 08/08/21   Marguerita Merles Latif, DO  FLUoxetine (PROZAC) 20 MG capsule Take 1 capsule (20 mg total) by mouth daily. 08/08/21   Marguerita Merles Latif, DO  folic acid (FOLVITE) 1 MG tablet Take 1 tablet (1 mg total) by mouth daily. 08/09/21   Marguerita Merles Latif, DO  gabapentin ([name]) 300 MG capsule Take 1 capsule (300 mg total) by mouth 2 (two) times daily. 08/08/21   Marguerita Merles Latif, DO  hydrALAZINE (APRESOLINE) 25 MG tablet Take 3 tablets (75 mg total) by mouth every 8 (eight) hours. 08/08/21   Marguerita Merles Latif, DO  insulin glargine-yfgn (SEMGLEE) 100 UNIT/ML injection Inject 0.15 mLs (15 Units total) into the skin daily. 08/08/21   Marguerita Merles Latif, DO  lidocaine  (LIDODERM) 5 % Place 1 patch onto the skin daily. Remove & Discard patch within 12 hours or as directed by MD 08/09/21   Merlene Laughter, DO  loperamide (IMODIUM) 2 MG capsule Take 2 capsules (4 mg total) by mouth 3 (three) times daily as needed for diarrhea or loose stools. 08/08/21   Marguerita Merles Latif, DO  magnesium oxide (MAG-OX) 400 (240 Mg) MG tablet Take 1 tablet (400 mg total) by mouth daily. 08/09/21   Marguerita Merles Latif, DO  Multiple Vitamin (MULTIVITAMIN WITH MINERALS) TABS tablet Take 1 tablet by mouth daily. 08/09/21   Marguerita Merles Latif, DO  pantoprazole (PROTONIX) 40 MG tablet Take 1 tablet (40 mg total) by mouth daily. 08/09/21   Marguerita Merles Latif, DO  silver sulfADIAZINE (SILVADENE) 1 % cream Apply 1 Application topically daily. 10/30/22   Standiford, Jenelle Mages, DPM  thiamine (VITAMIN B1) 100 MG tablet Take 1 tablet (100 mg total) by mouth daily. 08/09/21   Marguerita Merles Latif, DO      Allergies    Heparin, Oxytetracycline, Del-mycin [erythromycin], and Sulfa antibiotics    Review of Systems   Review of Systems  Constitutional:  Negative for fever.  Skin:  Positive for wound.  All other systems reviewed and are negative.   Physical Exam Updated Vital Signs BP 125/72   Pulse (!) 59   Temp 98.4 F (36.9 C) (Oral)   Resp 13   Ht 6\' 2"  (1.88 m)  Wt 106.1 kg   SpO2 100%   BMI 30.04 kg/m  Physical Exam Vitals and nursing note reviewed.  Constitutional:      General: He is not in acute distress.    Appearance: Normal appearance. He is not ill-appearing.  HENT:     Head: Normocephalic and atraumatic.     Nose: Nose normal.  Eyes:     Conjunctiva/sclera: Conjunctivae normal.  Cardiovascular:     Rate and Rhythm: Normal rate.  Pulmonary:     Effort: Pulmonary effort is normal. No respiratory distress.  Musculoskeletal:        General: No deformity. Normal range of motion.     Cervical back: Normal range of motion.  Skin:    Findings: No rash.      Comments: See attached image for description of wound to the toes of the right foot.  Neurological:     Mental Status: He is alert.     ED Results / Procedures / Treatments   Labs (all labs ordered are listed, but only abnormal results are displayed) Labs Reviewed  CBC WITH DIFFERENTIAL/PLATELET - Abnormal; Notable for the following components:      Result Value   Hemoglobin 12.1 (*)    HCT 35.5 (*)    MCV 78.7 (*)    All other components within normal limits  COMPREHENSIVE METABOLIC PANEL - Abnormal; Notable for the following components:   Sodium 130 (*)    Chloride 95 (*)    Glucose, Bld 319 (*)    All other components within normal limits  LACTIC ACID, PLASMA - Abnormal; Notable for the following components:   Lactic Acid, Venous 2.6 (*)    All other components within normal limits  CULTURE, BLOOD (ROUTINE X 2)  CULTURE, BLOOD (ROUTINE X 2)    EKG None  Radiology No results found.  Procedures Procedures    Medications Ordered in ED Medications  cefTRIAXone (ROCEPHIN) 1 g in sodium chloride 0.9 % 100 mL IVPB (1 g Intravenous New Bag/Given 11/21/22 1749)  lactated ringers bolus 1,000 mL (has no administration in time range)  vancomycin (VANCOREADY) IVPB 1750 mg/350 mL (has no administration in time range)  ondansetron (ZOFRAN) injection 4 mg (4 mg Intravenous Given 11/21/22 1746)    ED Course/ Medical Decision Making/ A&P                                 Medical Decision Making Risk Prescription drug management. Decision regarding hospitalization.   62 year old male presents today for concern of worsening wound to the right small toe.  Has been followed outpatient with podiatry.  Plan to have amputation done tomorrow due to concern of it being nonviable.  Notified podiatry that patient presented to the ED and will be admitted to medicine.  Discussed with medicine.  They will evaluate patient for admission.  Blood cultures, antibiotics ordered.  Initial  lactic acid of 2.6.  Fluid bolus ordered.   Final Clinical Impression(s) / ED Diagnoses Final diagnoses:  Toe infection    Rx / DC Orders ED Discharge Orders     None         [name] Kansas, PA-C 11/21/22 1803    [name] Maus, DO 11/21/22 1843

## 2022-11-21 NOTE — Progress Notes (Signed)
Podiatry plan of care  Patient with history DM2 with neuropathy with right foot fifth toe gangrene following neuropathic thermal injury from keeping foot to near a space heater for prolonged period of time couple weeks ago  Upon evaluation yesterday by my partner Dr. Lilian Kapur there was concern for additional necrosis of the fifth toe however his other toes are healing.  Dr. Lilian Kapur recommended the patient be admitted for amputation of the fifth toe.  Patient is in agreement with this plan.  States the fifth toe has gotten worse progressively.  N.p.o. past midnight 4 OR tomorrow a.m. plan for fifth toe amputation possible partial fifth ray amputation as needed for wound closure however hopeful that we can proceed with just with toe amputation  IV antibiotics continue broad-spectrum pending culture data  Full consult to follow tomorrow preop        Corinna Gab, DPM [facility] / [facility]                   11/21/2022

## 2022-11-21 NOTE — Telephone Encounter (Signed)
Called and spoke with the patient this morning. He is getting himself ready and will head to the hospital this morning - he said it might be lunchtime before he can get there. Advised that Dr. Annamary Rummage is on call this weekend and will be the podiatrist he will see for surgery on Saturday.

## 2022-11-21 NOTE — Plan of Care (Signed)

## 2022-11-21 NOTE — ED Notes (Signed)
ED TO INPATIENT HANDOFF REPORT  ED Nurse Name and Phone #:   S Name/Age/Gender [name] 62 y.o. male Room/Bed: 031C/031C  Code Status   Code Status: Full Code  Home/SNF/Other Home Patient oriented to: self, place, time, and situation Is this baseline? Yes   Triage Complete: Triage complete  Chief Complaint Gangrene of right foot [facility]) [I96]  Triage Note Pt to ED pov. Pt saw foot doctor yesterday for ongoing injury to right foot. Pt states that in October he fell asleep with a space heater too close to his foot and the next morning he woke up and his foot was blistered. Pt states he was told to come to ED to be evaluated have have an amputation tomorrow. Right foot toes are black in color, some redness noted on foot. Foot wrapped.    Allergies Allergies  Allergen Reactions   Heparin Other (See Comments)    HIT Ab negative on 02/20/15, but SRA POSITIVE    Oxytetracycline Rash and Other (See Comments)   Del-Mycin [Erythromycin] Other (See Comments)    All "mycin drugs" - unknown reaction   Sulfa Antibiotics Other (See Comments)    Unknown reaction    Level of Care/Admitting Diagnosis ED Disposition     ED Disposition  Admit   Condition  --   Comment  Hospital Area: [facility] [[id number]]  Level of Care: Telemetry Surgical [105]  May place patient in observation at [facility] or [facility] if equivalent level of care is available:: No  Covid Evaluation: Asymptomatic - no recent exposure (last 10 days) testing not required  Diagnosis: Gangrene of right foot [facility]) [[id number]]  Admitting Physician: Synetta Fail [[id number]]  Attending Physician: Synetta Fail [phone]          B Medical/Surgery History Past Medical History:  Diagnosis Date   Anxiety    Arthritis    Bipolar 1 disorder (HCC)    Blood transfusion without reported diagnosis    Cataract    Chronic pain    Depressed bipolar disorder (HCC)    Depression     Diabetes mellitus without complication (HCC)    Diabetic neuropathy (HCC)    GERD (gastroesophageal reflux disease)    Herpes zoster 12/05/2008   Qualifier: Diagnosis of  By: Yetta Barre MD, Bernadene Bell.    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  Coupled with high doses of loperamide used for pain control.  Unintentional overdose -intractable VT leading to cardiogenic shock - ECMO   Hyperlipidemia    Hypertension    Neuromuscular disorder (HCC)    nerve damage back, neck, and shoulder   Neuropathy in diabetes (HCC)    Sleep apnea    has CPAP but cannot use it   Substance abuse (HCC)    in past    Past Surgical History:  Procedure Laterality Date   COLONOSCOPY     EXTRACORPOREAL CIRCULATION  11/2015   FOR Cardiogenic shock related to intractable [place] VT storm (prolonged QT from drug toxixcity)    INTRAOPERATIVE TRANSESOPHAGEAL ECHOCARDIOGRAM N/A 11/26/2016   Procedure: INTRAOPERATIVE TRANSESOPHAGEAL ECHOCARDIOGRAM;  Surgeon: Kerin Perna, MD;  Location: [facility] OR;  Service: Open Heart Surgery;  Laterality: N/A;   IR ANGIOGRAM EXTREMITY LEFT  06/05/2020   IR PTA NON CORO-LOWER EXTREM  06/05/2020   IR RADIOLOGIST EVAL & MGMT  05/21/2020   IR US GUIDE VASC ACCESS LEFT  06/05/2020   POLYPECTOMY     SHOULDER ARTHROSCOPY WITH ROTATOR CUFF REPAIR Right 2009   SPINE SURGERY  2010   TRANSTHORACIC ECHOCARDIOGRAM  07/2016; 12/10/2016   a. Prior to VT arrest: normal. EF 55-60%. Gr 1 DD.  mild LVH. Mildly dilated Aortic Root.;; b.  Normal LV size and function.  Mild LVH.  EF 55%.  No RWMA.  No valve abnormalities.     A IV Location/Drains/Wounds Patient Lines/Drains/Airways Status     Active Line/Drains/Airways     Name Placement date Placement time Site Days   Peripheral IV 11/21/22 20 G Right;Posterior Hand 11/21/22  1733  Hand  less than 1            Intake/Output Last 24 hours No intake or output  data in the 24 hours ending 11/21/22 1752  Labs/Imaging Results for orders placed or performed during the hospital encounter of 11/21/22 (from the past 48 hour(s))  CBC with Differential     Status: Abnormal   Collection Time: 11/21/22  1:33 PM  Result Value Ref Range   WBC 6.8 4.0 - 10.5 K/uL   RBC 4.51 4.22 - 5.81 MIL/uL   Hemoglobin 12.1 (L) 13.0 - 17.0 g/dL   HCT 29.5 (L) 62.1 - 30.8 %   MCV 78.7 (L) 80.0 - 100.0 fL   MCH 26.8 26.0 - 34.0 pg   MCHC 34.1 30.0 - 36.0 g/dL   RDW 65.7 84.6 - 96.2 %   Platelets 186 150 - 400 K/uL   nRBC 0.0 0.0 - 0.2 %   Neutrophils Relative % 76 %   Neutro Abs 5.2 1.7 - 7.7 K/uL   Lymphocytes Relative 17 %   Lymphs Abs 1.2 0.7 - 4.0 K/uL   Monocytes Relative 5 %   Monocytes Absolute 0.4 0.1 - 1.0 K/uL   Eosinophils Relative 1 %   Eosinophils Absolute 0.1 0.0 - 0.5 K/uL   Basophils Relative 1 %   Basophils Absolute 0.0 0.0 - 0.1 K/uL   Immature Granulocytes 0 %   Abs Immature Granulocytes 0.02 0.00 - 0.07 K/uL    Comment: Performed at [facility], [address], [place], [place] [address]  Comprehensive metabolic panel     Status: Abnormal   Collection Time: 11/21/22  1:33 PM  Result Value Ref Range   Sodium 130 (L) 135 - 145 mmol/L   Potassium 3.5 3.5 - 5.1 mmol/L   Chloride 95 (L) 98 - 111 mmol/L   CO2 23 22 - 32 mmol/L   Glucose, Bld 319 (H) 70 - 99 mg/dL    Comment: Glucose reference range applies only to samples taken after fasting for at least 8 hours.   BUN 16 8 - 23 mg/dL   Creatinine, Ser 1.32 0.61 - 1.24 mg/dL   Calcium 9.0 8.9 - 44.0 mg/dL   Total Protein 7.0 6.5 - 8.1 g/dL   Albumin 3.6 3.5 - 5.0 g/dL   AST 28 15 - 41 U/L   ALT 23 0 - 44 U/L   Alkaline Phosphatase 60 38 - 126 U/L   Total Bilirubin 0.8 <1.2 mg/dL   GFR, Estimated >10 >27 mL/min    Comment: (NOTE) Calculated using the CKD-EPI Creatinine Equation (2021)    Anion gap 12 5 - 15    Comment: Performed at [facility], [address],  [place], [place] [address]  Lactic acid, plasma     Status: Abnormal   Collection Time: 11/21/22  1:33 PM  Result Value Ref Range   Lactic Acid, Venous 2.6 (HH) 0.5 - 1.9 mmol/L    Comment: CRITICAL RESULT CALLED TO, READ BACK BY AND VERIFIED WITH C.BLACK,RN 1424 11/21/22 CLARK,S Performed at [facility], [address], [place], [place] [address]    No results found.  Pending Labs Unresulted Labs (From admission, onward)     Start     Ordered   11/22/22 0500  Comprehensive metabolic panel  Tomorrow morning,   R        11/21/22 1751   11/22/22 0500  CBC  Tomorrow morning,   R        11/21/22 1751   11/21/22 1748  HIV Antibody (routine testing w rflx)  (HIV Antibody (Routine testing w reflex) panel)  Once,   R        11/21/22 1751   11/21/22 1713  Blood culture (routine x 2)  BLOOD CULTURE X 2,   R (with STAT occurrences)      11/21/22 1713            Vitals/Pain Today's Vitals   11/21/22 1305 11/21/22 1325 11/21/22 1725 11/21/22 1743  BP: 134/67  125/72   Pulse: (!) 58  (!) 59   Resp: 20  13   Temp: 98.4 F (36.9 C)   98.4 F (36.9 C)  TempSrc: Oral   Oral  SpO2: 100%  100%   Weight:  106.1 kg    Height:  6\' 2"  (1.88 m)    PainSc:  0-No pain      Isolation Precautions No active isolations  Medications Medications  cefTRIAXone (ROCEPHIN) 1 g in sodium chloride 0.9 % 100 mL IVPB (1 g Intravenous New Bag/Given 11/21/22 1749)  lactated ringers bolus 1,000 mL (has no administration in time range)  vancomycin (VANCOREADY) IVPB 1750 mg/350 mL (has no administration in time range)  loperamide (IMODIUM) capsule 4 mg (has no administration in time range)  pantoprazole (PROTONIX) EC tablet 40 mg (has no administration in time range)  folic acid (FOLVITE) tablet 1 mg (has no administration in time range)  thiamine (VITAMIN B1) tablet 100 mg (has no administration in time range)  sodium chloride flush (NS) 0.9 % injection 3 mL (has no administration in time range)   acetaminophen (TYLENOL) tablet 650 mg (has no administration in time range)    Or  acetaminophen (TYLENOL) suppository 650 mg (has no administration in time range)  polyethylene glycol (MIRALAX / GLYCOLAX) packet 17 g (has no administration in time range)  ondansetron (ZOFRAN) injection 4 mg (4 mg Intravenous Given 11/21/22 1746)    Mobility walks     Focused Assessments    R Recommendations: See Admitting Provider Note  Report given to:   Additional Notes:

## 2022-11-21 NOTE — ED Triage Notes (Signed)
Pt to ED pov. Pt saw foot doctor yesterday for ongoing injury to right foot. Pt states that in October he fell asleep with a space heater too close to his foot and the next morning he woke up and his foot was blistered. Pt states he was told to come to ED to be evaluated have have an amputation tomorrow. Right foot toes are [name] [name] in color, some redness noted on foot. Foot wrapped.

## 2022-11-22 ENCOUNTER — Encounter (HOSPITAL_COMMUNITY)
Admission: EM | Disposition: A | Discharge status: Still In Hospital | Payer: Self-pay | Source: Home / Self Care | Attending: Emergency Medicine

## 2022-11-22 ENCOUNTER — Observation Stay (HOSPITAL_COMMUNITY): Payer: Medicare Other | Admitting: Anesthesiology

## 2022-11-22 ENCOUNTER — Encounter (HOSPITAL_COMMUNITY): Payer: Self-pay | Admitting: Internal Medicine

## 2022-11-22 ENCOUNTER — Other Ambulatory Visit: Payer: Self-pay

## 2022-11-22 DIAGNOSIS — I96 Gangrene, not elsewhere classified: Secondary | ICD-10-CM | POA: Diagnosis not present

## 2022-11-22 DIAGNOSIS — E11628 Type 2 diabetes mellitus with other skin complications: Secondary | ICD-10-CM | POA: Diagnosis not present

## 2022-11-22 DIAGNOSIS — E871 Hypo-osmolality and hyponatremia: Secondary | ICD-10-CM | POA: Diagnosis not present

## 2022-11-22 DIAGNOSIS — E1142 Type 2 diabetes mellitus with diabetic polyneuropathy: Secondary | ICD-10-CM | POA: Diagnosis not present

## 2022-11-22 DIAGNOSIS — L859 Epidermal thickening, unspecified: Secondary | ICD-10-CM | POA: Diagnosis not present

## 2022-11-22 DIAGNOSIS — E785 Hyperlipidemia, unspecified: Secondary | ICD-10-CM | POA: Diagnosis not present

## 2022-11-22 DIAGNOSIS — E119 Type 2 diabetes mellitus without complications: Secondary | ICD-10-CM | POA: Diagnosis not present

## 2022-11-22 DIAGNOSIS — Z79899 Other long term (current) drug therapy: Secondary | ICD-10-CM | POA: Diagnosis not present

## 2022-11-22 DIAGNOSIS — G894 Chronic pain syndrome: Secondary | ICD-10-CM | POA: Diagnosis not present

## 2022-11-22 DIAGNOSIS — E782 Mixed hyperlipidemia: Secondary | ICD-10-CM

## 2022-11-22 DIAGNOSIS — L98499 Non-pressure chronic ulcer of skin of other sites with unspecified severity: Secondary | ICD-10-CM | POA: Diagnosis not present

## 2022-11-22 DIAGNOSIS — I10 Essential (primary) hypertension: Secondary | ICD-10-CM | POA: Diagnosis not present

## 2022-11-22 DIAGNOSIS — E1152 Type 2 diabetes mellitus with diabetic peripheral angiopathy with gangrene: Secondary | ICD-10-CM | POA: Diagnosis not present

## 2022-11-22 DIAGNOSIS — M7989 Other specified soft tissue disorders: Secondary | ICD-10-CM | POA: Diagnosis not present

## 2022-11-22 DIAGNOSIS — F3181 Bipolar II disorder: Secondary | ICD-10-CM | POA: Diagnosis not present

## 2022-11-22 DIAGNOSIS — E876 Hypokalemia: Secondary | ICD-10-CM | POA: Diagnosis not present

## 2022-11-22 HISTORY — PX: AMPUTATION TOE: SHX6595

## 2022-11-22 LAB — CBC
HCT: 34.2 % — ABNORMAL LOW (ref 39.0–52.0)
Hemoglobin: 12 g/dL — ABNORMAL LOW (ref 13.0–17.0)
MCH: 27.8 pg (ref 26.0–34.0)
MCHC: 35.1 g/dL (ref 30.0–36.0)
MCV: 79.4 fL — ABNORMAL LOW (ref 80.0–100.0)
Platelets: 168 10*3/uL (ref 150–400)
RBC: 4.31 MIL/uL (ref 4.22–5.81)
RDW: 12.8 % (ref 11.5–15.5)
WBC: 5.4 10*3/uL (ref 4.0–10.5)
nRBC: 0 % (ref 0.0–0.2)

## 2022-11-22 LAB — TSH: TSH: 1.749 u[IU]/mL (ref 0.350–4.500)

## 2022-11-22 LAB — TYPE AND SCREEN
ABO/RH(D): A POS
Antibody Screen: NEGATIVE

## 2022-11-22 LAB — COMPREHENSIVE METABOLIC PANEL
ALT: 16 U/L (ref 0–44)
AST: 20 U/L (ref 15–41)
Albumin: 3 g/dL — ABNORMAL LOW (ref 3.5–5.0)
Alkaline Phosphatase: 47 U/L (ref 38–126)
Anion gap: 11 (ref 5–15)
BUN: 11 mg/dL (ref 8–23)
CO2: 24 mmol/L (ref 22–32)
Calcium: 8.2 mg/dL — ABNORMAL LOW (ref 8.9–10.3)
Chloride: 96 mmol/L — ABNORMAL LOW (ref 98–111)
Creatinine, Ser: 0.83 mg/dL (ref 0.61–1.24)
GFR, Estimated: >60 mL/min (ref >60)
Glucose, Bld: 168 mg/dL — ABNORMAL HIGH (ref 70–99)
Potassium: 3.6 mmol/L (ref 3.5–5.1)
Sodium: 131 mmol/L — ABNORMAL LOW (ref 135–145)
Total Bilirubin: 0.7 mg/dL (ref <1.2)
Total Protein: 5.8 g/dL — ABNORMAL LOW (ref 6.5–8.1)

## 2022-11-22 LAB — GLUCOSE, CAPILLARY
Glucose-Capillary: 199 mg/dL — ABNORMAL HIGH (ref 70–99)
Glucose-Capillary: 196 mg/dL — ABNORMAL HIGH (ref 70–99)
Glucose-Capillary: 198 mg/dL — ABNORMAL HIGH (ref 70–99)
Glucose-Capillary: 271 mg/dL — ABNORMAL HIGH (ref 70–99)
Glucose-Capillary: 245 mg/dL — ABNORMAL HIGH (ref 70–99)

## 2022-11-22 LAB — HEMOGLOBIN A1C
Hgb A1c MFr Bld: 9.4 % — ABNORMAL HIGH (ref 4.8–5.6)
Mean Plasma Glucose: 223.08 mg/dL

## 2022-11-22 LAB — HIV ANTIBODY (ROUTINE TESTING W REFLEX): HIV Screen 4th Generation wRfx: NONREACTIVE

## 2022-11-22 SURGERY — AMPUTATION, TOE
Anesthesia: Monitor Anesthesia Care | Site: Toe | Laterality: Right

## 2022-11-22 MED ORDER — HYDROMORPHONE HCL 2 MG PO TABS
4.0000 mg | ORAL_TABLET | ORAL | Status: DC | PRN
  Administered 2022-11-22 – 2022-11-24 (×5): 4 mg via ORAL
  Filled 2022-11-22 (×5): qty 2

## 2022-11-22 MED ORDER — AMLODIPINE BESYLATE 10 MG PO TABS
10.0000 mg | ORAL_TABLET | Freq: Every morning | ORAL | Status: DC
  Administered 2022-11-23 – 2022-11-24 (×2): 10 mg via ORAL
  Filled 2022-11-22 (×2): qty 1

## 2022-11-22 MED ORDER — FENTANYL CITRATE (PF) 100 MCG/2ML IJ SOLN
25.0000 ug | INTRAMUSCULAR | Status: DC | PRN
  Administered 2022-11-22 (×2): 50 ug via INTRAVENOUS

## 2022-11-22 MED ORDER — LOSARTAN POTASSIUM 50 MG PO TABS
100.0000 mg | ORAL_TABLET | Freq: Every day | ORAL | Status: DC
  Administered 2022-11-23 – 2022-11-24 (×2): 100 mg via ORAL
  Filled 2022-11-22 (×2): qty 2

## 2022-11-22 MED ORDER — CHLORHEXIDINE GLUCONATE CLOTH 2 % EX PADS
6.0000 | MEDICATED_PAD | Freq: Once | CUTANEOUS | Status: AC
  Administered 2022-11-22: 6 via TOPICAL

## 2022-11-22 MED ORDER — ORAL CARE MOUTH RINSE: 15.0000 mL | Freq: Once | OROMUCOSAL | Status: AC

## 2022-11-22 MED ORDER — CEFAZOLIN SODIUM-DEXTROSE 1-4 GM/50ML-% IV SOLN
INTRAVENOUS | Status: DC | PRN
  Administered 2022-11-22: 2 g via INTRAVENOUS

## 2022-11-22 MED ORDER — CEFAZOLIN SODIUM 1 G IJ SOLR
INTRAMUSCULAR | Status: AC
  Filled 2022-11-22: qty 20

## 2022-11-22 MED ORDER — CEFADROXIL 500 MG PO CAPS
500.0000 mg | ORAL_CAPSULE | Freq: Two times a day (BID) | ORAL | Status: DC
  Administered 2022-11-22 – 2022-11-24 (×5): 500 mg via ORAL
  Filled 2022-11-22 (×5): qty 1

## 2022-11-22 MED ORDER — BUPIVACAINE HCL (PF) 0.5 % IJ SOLN
INTRAMUSCULAR | Status: AC
  Filled 2022-11-22: qty 10

## 2022-11-22 MED ORDER — LIDOCAINE HCL 1 % IJ SOLN
INTRAMUSCULAR | Status: AC
  Filled 2022-11-22: qty 20

## 2022-11-22 MED ORDER — 0.9 % SODIUM CHLORIDE (POUR BTL) OPTIME
TOPICAL | Status: DC | PRN
  Administered 2022-11-22: 1000 mL

## 2022-11-22 MED ORDER — CHLORHEXIDINE GLUCONATE 0.12 % MT SOLN
OROMUCOSAL | Status: AC
  Administered 2022-11-22: 15 mL via OROMUCOSAL
  Filled 2022-11-22: qty 15

## 2022-11-22 MED ORDER — METRONIDAZOLE 500 MG PO TABS
500.0000 mg | ORAL_TABLET | Freq: Two times a day (BID) | ORAL | Status: DC
  Administered 2022-11-22 – 2022-11-24 (×5): 500 mg via ORAL
  Filled 2022-11-22 (×5): qty 1

## 2022-11-22 MED ORDER — PROPOFOL 500 MG/50ML IV EMUL
INTRAVENOUS | Status: DC | PRN
  Administered 2022-11-22: 100 ug/kg/min via INTRAVENOUS

## 2022-11-22 MED ORDER — HYDROMORPHONE HCL 2 MG PO TABS: 2.0000 mg | ORAL_TABLET | ORAL | Status: DC | PRN

## 2022-11-22 MED ORDER — FENTANYL CITRATE (PF) 100 MCG/2ML IJ SOLN
INTRAMUSCULAR | Status: AC
  Filled 2022-11-22: qty 2

## 2022-11-22 MED ORDER — INSULIN ASPART 100 UNIT/ML IJ SOLN
0.0000 [IU] | INTRAMUSCULAR | Status: DC | PRN
  Administered 2022-11-22: 4 [IU] via SUBCUTANEOUS

## 2022-11-22 MED ORDER — ATORVASTATIN CALCIUM 80 MG PO TABS
80.0000 mg | ORAL_TABLET | Freq: Every day | ORAL | Status: DC
  Administered 2022-11-23 – 2022-11-24 (×2): 80 mg via ORAL
  Filled 2022-11-22 (×2): qty 1

## 2022-11-22 MED ORDER — PROPOFOL 1000 MG/100ML IV EMUL
INTRAVENOUS | Status: AC
  Filled 2022-11-22: qty 100

## 2022-11-22 MED ORDER — ACETAMINOPHEN 10 MG/ML IV SOLN: 1000.0000 mg | Freq: Once | INTRAVENOUS | Status: DC | PRN

## 2022-11-22 MED ORDER — LIDOCAINE HCL (PF) 1 % IJ SOLN
INTRAMUSCULAR | Status: DC | PRN
  Administered 2022-11-22: 10 mL via INTRAMUSCULAR

## 2022-11-22 MED ORDER — LACTATED RINGERS IV SOLN: INTRAVENOUS | Status: DC | PRN

## 2022-11-22 MED ORDER — CHLORHEXIDINE GLUCONATE 0.12 % MT SOLN: 15.0000 mL | Freq: Once | OROMUCOSAL | Status: AC

## 2022-11-22 MED ORDER — OXYCODONE HCL 5 MG PO TABS
10.0000 mg | ORAL_TABLET | ORAL | Status: DC | PRN
  Administered 2022-11-22: 10 mg via ORAL
  Filled 2022-11-22: qty 2

## 2022-11-22 MED ORDER — MIDAZOLAM HCL 2 MG/2ML IJ SOLN
INTRAMUSCULAR | Status: AC
  Filled 2022-11-22: qty 2

## 2022-11-22 MED ORDER — SODIUM CHLORIDE 0.9 % IR SOLN
Status: DC | PRN
  Administered 2022-11-22: 3000 mL

## 2022-11-22 MED ORDER — SERTRALINE HCL 100 MG PO TABS
100.0000 mg | ORAL_TABLET | Freq: Every day | ORAL | Status: DC
  Administered 2022-11-23 – 2022-11-24 (×2): 100 mg via ORAL
  Filled 2022-11-22 (×2): qty 1

## 2022-11-22 MED ORDER — MIDAZOLAM HCL 2 MG/2ML IJ SOLN
INTRAMUSCULAR | Status: DC | PRN
  Administered 2022-11-22: 2 mg via INTRAVENOUS

## 2022-11-22 MED ORDER — SODIUM CHLORIDE 0.9% FLUSH
10.0000 mL | Freq: Once | INTRAVENOUS | Status: AC
  Administered 2022-11-22: 10 mL via INTRAVENOUS

## 2022-11-22 SURGICAL SUPPLY — 43 items
APL PRP STRL LF DISP 70% ISPRP (MISCELLANEOUS) ×1
BLADE OSC/SAGITTAL MD 5.5X18 (BLADE) ×2 IMPLANT
BLADE SURG 10 STRL SS (BLADE) ×2 IMPLANT
BLADE SURG 15 STRL LF DISP TIS (BLADE) ×4 IMPLANT
BLADE SURG 15 STRL SS (BLADE) ×2
BNDG CMPR 5X3 CHSV STRCH STRL (GAUZE/BANDAGES/DRESSINGS) ×1
BNDG CMPR 5X3 KNIT ELC UNQ LF (GAUZE/BANDAGES/DRESSINGS) ×1
BNDG CMPR 5X4 KNIT ELC UNQ LF (GAUZE/BANDAGES/DRESSINGS) ×1
BNDG CMPR 75X21 PLY HI ABS (MISCELLANEOUS) ×1
BNDG CMPR 9X4 STRL LF SNTH (GAUZE/BANDAGES/DRESSINGS) ×1
BNDG COHESIVE 3X5 TAN ST LF (GAUZE/BANDAGES/DRESSINGS) ×2 IMPLANT
BNDG ELASTIC 3INX 5YD STR LF (GAUZE/BANDAGES/DRESSINGS) ×2 IMPLANT
BNDG ELASTIC 4INX 5YD STR LF (GAUZE/BANDAGES/DRESSINGS) IMPLANT
BNDG ESMARK 4X9 LF (GAUZE/BANDAGES/DRESSINGS) ×2 IMPLANT
BNDG GAUZE DERMACEA FLUFF 4 (GAUZE/BANDAGES/DRESSINGS) IMPLANT
BNDG GZE DERMACEA 4 6PLY (GAUZE/BANDAGES/DRESSINGS) ×1
CHLORAPREP W/TINT 26 (MISCELLANEOUS) ×2 IMPLANT
DRSG EMULSION OIL 3X3 NADH (GAUZE/BANDAGES/DRESSINGS) IMPLANT
ELECT REM PT RETURN 9FT ADLT (ELECTROSURGICAL) ×1
ELECTRODE REM PT RTRN 9FT ADLT (ELECTROSURGICAL) ×2 IMPLANT
GAUZE SPONGE 2X2 STRL 8-PLY (GAUZE/BANDAGES/DRESSINGS) ×4 IMPLANT
GAUZE SPONGE 4X4 12PLY STRL (GAUZE/BANDAGES/DRESSINGS) ×2 IMPLANT
GAUZE STRETCH 2X75IN STRL (MISCELLANEOUS) ×2 IMPLANT
GAUZE XEROFORM 1X8 LF (GAUZE/BANDAGES/DRESSINGS) ×2 IMPLANT
GLOVE BIO SURGEON STRL SZ7.5 (GLOVE) ×2 IMPLANT
GLOVE BIOGEL PI IND STRL 7.5 (GLOVE) ×2 IMPLANT
GOWN STRL REUS W/ TWL LRG LVL3 (GOWN DISPOSABLE) ×4 IMPLANT
GOWN STRL REUS W/TWL LRG LVL3 (GOWN DISPOSABLE) ×2
KIT BASIN OR (CUSTOM PROCEDURE TRAY) ×2 IMPLANT
NDL BIOPSY JAMSHIDI 8X6 (NEEDLE) IMPLANT
NDL HYPO 25X1 1.5 SAFETY (NEEDLE) ×4 IMPLANT
NEEDLE BIOPSY JAMSHIDI 8X6 (NEEDLE) IMPLANT
NEEDLE HYPO 25X1 1.5 SAFETY (NEEDLE) ×2 IMPLANT
PACK ORTHO EXTREMITY (CUSTOM PROCEDURE TRAY) ×2 IMPLANT
PADDING CAST ABS COTTON 4X4 ST (CAST SUPPLIES) ×4 IMPLANT
SPIKE FLUID TRANSFER (MISCELLANEOUS) ×4 IMPLANT
STAPLER VISISTAT 35W (STAPLE) IMPLANT
STOCKINETTE 4X48 STRL (DRAPES) ×2 IMPLANT
SUT ETHILON 3 0 FSLX (SUTURE) ×2 IMPLANT
SUT PROLENE 4 0 PS 2 18 (SUTURE) ×4 IMPLANT
SYR CONTROL 10ML LL (SYRINGE) ×4 IMPLANT
UNDERPAD 30X36 HEAVY ABSORB (UNDERPADS AND DIAPERS) ×2 IMPLANT
WATER STERILE IRR 1000ML POUR (IV SOLUTION) ×2 IMPLANT

## 2022-11-22 NOTE — Assessment & Plan Note (Signed)
11-22-2022 continue Seroquel 150 mg nightly

## 2022-11-22 NOTE — Assessment & Plan Note (Signed)
11-22-2022 Needs extensive education on insulin therapy, diet.  Patient recently prescribed insulin which she has not started yet.  Will have diabetes educator instruct the patient on continuous glucose monitoring, insulin pen administration, diet.  Patient states that he is committed to taking better care of his diabetes.  Hemoglobin A1c 9.4% indicating poor outpatient control. 11-23-2022 pt needs DM education. A1c 9.4% indication poor outpatient control. Pt states he is determined to get his DM under control. Add 8 unit SQ lantus daily.  11-24-2022 will increase DC lantus dose to 12 units every day. Add SSI to outpatient meds. Awaiting inpatient diabetes educator if possible with his time constraints.

## 2022-11-22 NOTE — Assessment & Plan Note (Signed)
11-22-2022 continue Zoloft 100 mg daily

## 2022-11-22 NOTE — Plan of Care (Signed)

## 2022-11-22 NOTE — Assessment & Plan Note (Signed)
11-22-2022 patient on HCTZ at home.  Holding HCTZ.  Check TSH.  Repeat BMP in the morning. 11-23-2022 improved. Hold hydrochlorothiazide at discharge

## 2022-11-22 NOTE — Inpatient Diabetes Management (Signed)
Inpatient Diabetes Program Recommendations  AACE/ADA: New Consensus Statement on Inpatient Glycemic Control (2015)  Target Ranges:  Prepandial:   less than 140 mg/dL      Peak postprandial:   less than 180 mg/dL (1-2 hours)      Critically ill patients:  140 - 180 mg/dL   Lab Results  Component Value Date   GLUCAP 196 (H) 11/22/2022   HGBA1C 9.4 (H) 11/22/2022    Review of Glycemic Control  Diabetes history:DM 2 Outpatient Diabetes medications: Glipizide 10 mg bid, Metformin 500 mg bid, Lantus Daily Current orders for Inpatient glycemic control:  Novolog 0-15 units tid  Inpatient Diabetes Program Recommendations:    -   Start Semglee 10 units Daily  Currently in OR, looks like pt has been on lantus in the past unsure of dose. A1c elevated at 9.4%. Pt currently in the OR. Will follow for glucose management and education closer to d/c.   Thanks,  Christena Deem RN, MSN, BC-ADM Inpatient Diabetes Coordinator Team Pager [phone] (8a-5p)

## 2022-11-22 NOTE — Transfer of Care (Signed)
Immediate Anesthesia Transfer of Care Note  Patient: [name] [name]  Procedure(s) Performed: Right 5th partial ray amputaiton (Right: Toe)  Patient Location: PACU  Anesthesia Type:MAC  Level of Consciousness: awake and alert   Airway & Oxygen Therapy: Patient Spontanous Breathing  Post-op Assessment: Report given to RN and Post -op Vital signs reviewed and stable  Post vital signs: Reviewed  Last Vitals:  Vitals Value Taken Time  BP 117/62 11/22/22 1133  Temp 37.1 C 11/22/22 1133  Pulse 47 11/22/22 1134  Resp 12 11/22/22 1134  SpO2 97 % 11/22/22 1134  Vitals shown include unfiled device data.  Last Pain:  Vitals:   11/22/22 0953  TempSrc: Oral  PainSc:          Complications: There were no known notable events for this encounter.

## 2022-11-22 NOTE — Assessment & Plan Note (Addendum)
11-22-2022 continue with Norvasc 10 mg daily, losartan 100 mg daily.  Holding HCTZ due to hyponatremia.

## 2022-11-22 NOTE — Consult Note (Signed)
PODIATRY CONSULTATION  NAME [name] [name] MRN [id number] DOB October 26, 1960 DOA 11/21/2022   Reason for consult:  Chief Complaint  Patient presents with   Recurrent Skin Infections    Attending/Consulting physician: Beola Cord MD  History of present illness from Dr. Alinda Money: "Patient reports that he developed foot wounds back in October. He fell asleep with his foot to close to a space heater and woke up with blisters on his foot. He was seen in follow-up in the ED on 11/6 after worsening foot changes including discoloration and was started on Augmentin and follow-up was arranged with podiatry outpatient. He did see podiatry yesterday and after further investigation noted to have significant ulceration of multiple toes with gangrenous changes and cyanosis of the fifth toe."  Patient known to me from office visit on 10/17.  Patient was found to have thermal injury to the digits from space heater.  He was recommended to proceed with Silvadene ointment dry sterile dressings daily offload with surgical shoe and was prescribed Augmentin initially.  For 10 days.  He was found to have palpable pulses so therefore there was not concern for significant peripheral arterial disease in the right lower extremity.  Patient was supposed to follow-up on 10/31 but could not make the appointment due to family matters.  He subsequently had worsening infection in the right fifth toe went to the [facility] and then followed up in our clinic this past week with Dr. Lilian Kapur.  Dr. Lilian Kapur noted necrosis and nonviability of the fifth digit and recommended the patient proceed to the hospital for amputation.  Patient is in agreement Past Medical History:  Diagnosis Date   Anxiety    Arthritis    Bipolar 1 disorder (HCC)    Blood transfusion without reported diagnosis    Cataract    Chronic pain    Depressed bipolar disorder (HCC)    Depression    Diabetes mellitus without complication  (HCC)    Diabetic neuropathy (HCC)    GERD (gastroesophageal reflux disease)    Herpes zoster 12/05/2008   Qualifier: Diagnosis of  By: [name] Barre MD, [name] [name].    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  Coupled with high doses of loperamide used for pain control.  Unintentional overdose -intractable VT leading to cardiogenic shock - ECMO   Hyperlipidemia    Hypertension    Neuromuscular disorder (HCC)    nerve damage back, neck, and shoulder   Neuropathy in diabetes (HCC)    Sleep apnea    has CPAP but cannot use it   Substance abuse (HCC)    in past        Latest Ref Rng & Units 11/22/2022    4:52 AM 11/21/2022    1:33 PM 11/18/2022    4:13 PM  CBC  WBC 4.0 - 10.5 K/uL 5.4  6.8  7.7   Hemoglobin 13.0 - 17.0 g/dL 78.2  95.6  21.3   Hematocrit 39.0 - 52.0 % 34.2  35.5  37.0   Platelets 150 - 400 K/uL 168  186  235        Latest Ref Rng & Units 11/22/2022    4:52 AM 11/21/2022    1:33 PM 11/18/2022    4:13 PM  BMP  Glucose 70 - 99 mg/dL 086  578  469   BUN 8 - 23 mg/dL 11  16  18  Creatinine 0.61 - 1.24 mg/dL 5.40  9.81  1.91   Sodium 135 - 145 mmol/L 131  130  129   Potassium 3.5 - 5.1 mmol/L 3.6  3.5  4.3   Chloride 98 - 111 mmol/L 96  95  92   CO2 22 - 32 mmol/L 24  23  26    Calcium 8.9 - 10.3 mg/dL 8.2  9.0  9.9       Physical Exam: Lower Extremity Exam Vasc: R - PT palpable, DP palpable. Cap refill < 3 sec to digits  L - PT palpable, DP palpable. Cap refill <3 sec to digits  Derm: R - Ulceration of the toes of the right foot. Worst 5th digit with demarcation and circumfirential necrosis/maceration. No cap refill 5th toe. Erythema and edema proximal.    L - Normal temp/texture/turgor with no open lesion or clinical signs of infection    MSK:  R -  Edema of the alteral forefoot  L -  No gross deformities. Compartments soft, non-tender, compressible  Neuro: R - Gross sensation  absent. Gross motor function intact   L - Gross sensation absent. Gross motor function intact    ASSESSMENT/PLAN OF CARE 62 y.o. male with PMHx significant for  HTN HLD DM2 with neuropathy with right foot gangrene of the 5th toe 2/2 thermal injury from space heater.  AF VSS WBC 5.4 A1c 9.4  - NPO for OR today for right 5th toe amputation. If needed for closure purposes will resect 5th metatarsal head.  - Continue IV abx broad spectrum pending further culture data - Anticoagulation: Hold pending OR.  - Wound care: Dressing to remain c/d/I post procedure - WB status: WBAT to right foot in post op shoe - Will continue to follow   Thank you for the consult.  Please contact me directly with any questions or concerns.           Corinna Gab, DPM [facility]    [address] [place], [place] [address]                Office [phone]  Fax [phone]

## 2022-11-22 NOTE — Anesthesia Postprocedure Evaluation (Signed)
Anesthesia Post Note  Patient: [name] [name]  Procedure(s) Performed: Right 5th partial ray amputaiton (Right: Toe)     Patient location during evaluation: PACU Anesthesia Type: MAC Level of consciousness: awake and alert Pain management: pain level controlled Vital Signs Assessment: post-procedure vital signs reviewed and stable Respiratory status: spontaneous breathing, nonlabored ventilation, respiratory function stable and patient connected to nasal cannula oxygen Cardiovascular status: stable and blood pressure returned to baseline Postop Assessment: no apparent nausea or vomiting Anesthetic complications: no   There were no known notable events for this encounter.  Last Vitals:  Vitals:   11/22/22 1215 11/22/22 1230  BP: 129/69 124/73  Pulse: (!) 50 (!) 49  Resp: 10 12  Temp:    SpO2: 93% 99%    Last Pain:  Vitals:   11/22/22 1230  TempSrc:   PainSc: 3                  Nthony Lefferts P Terrelle Ruffolo

## 2022-11-22 NOTE — Hospital Course (Addendum)
HPI: [name] [name] is a 62 y.o. male with medical history significant of hypertension, hyperlipidemia, diabetes, neuropathy, GERD, OSA, alcohol use with history of withdrawal, bipolar disorder, depression, anxiety, chronic pain presenting with worsening foot wound.   Patient reports that he developed foot wounds back in October.  He fell asleep with his foot to close to a space heater and woke up with blisters on his foot.  He was seen in follow-up in the ED on 11/6 after worsening foot changes including discoloration and was started on Augmentin and follow-up was arranged with podiatry outpatient.  He did see podiatry yesterday and after further investigation noted to have significant ulceration of multiple toes with gangrenous changes and cyanosis of the fifth toe.   Denies fevers, chills, chest pain, shortness of breath, abdominal pain, constipation, diarrhea, nausea, vomiting.   ED Course: Vital signs in the ED notable for heart rate in the upper 50s.  Lab workup included CMP with sodium 130, chloride 95, glucose 319.  CBC with hemoglobin stable 12.1.  Lactic acid mildly elevated 2.6 with repeat pending.  Blood cultures pending.  Patient received vancomycin, ceftriaxone, liter IV fluids, Zofran in the ED.  EDP spoke with podiatry on-call to confirm plan for admission today and surgical intervention tomorrow and they will see the patient tomorrow.    Significant Events: Admitted 11/21/2022 for gangrene of right foot   Significant Labs: Admission Na 130, A1C 9.4%  Significant Imaging Studies:   Antibiotic Therapy: Anti-infectives (From admission, onward)    Start     Dose/Rate Route Frequency Ordered Stop   11/21/22 1730  vancomycin (VANCOREADY) IVPB 1750 mg/350 mL        1,750 mg 175 mL/hr over 120 Minutes Intravenous  Once 11/21/22 1717 11/21/22 2057   11/21/22 1715  cefTRIAXone (ROCEPHIN) 1 g in sodium chloride 0.9 % 100 mL IVPB        1 g 200 mL/hr over 30 Minutes Intravenous   Once 11/21/22 1713 11/21/22 1823       Procedures:   Consultants: Podiatry

## 2022-11-22 NOTE — Assessment & Plan Note (Signed)
11-22-2022 Continue Lipitor 80 mg daily

## 2022-11-22 NOTE — Op Note (Signed)
Full Operative Report  Date of Operation: 12:05 PM, 11/22/2022   Patient: [name] [name] - 62 y.o. male  Surgeon: [facility], DPM   Assistant: None  Diagnosis: Gangrene of toe, right foot  Procedure:  1. 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  Anesthesiologist: Atilano Median, DO CRNA: Gloris Ham, CRNA   Estimated Blood Loss: Minimal   Hemostasis: 1) Anatomical dissection, mechanical compression, electrocautery 2) no tourniquet was used during the procedure  Implants: * No implants in log *  Materials: Prolene 3-0 and skin staples  Injectables: 1) Pre-operatively: 10 cc of 50:50 mixture 1%lidocaine plain and 0.5% marcaine plain 2) Post-operatively: None   Specimens: Pathology: Fifth toe for pathology as well as fifth metatarsal head. microbiology: Deep tissue culture amputation site   Antibiotics: IV antibiotics given per schedule on the floor  Drains: None  Complications: Patient tolerated the procedure well without complication.   Operative findings: As below in detailed report  Indications for Procedure: [name] [name] presents to [facility], [place] with a chief complaint of gangrene of the right fifth digit secondary to thermal injury.  Patient with history of diabetes with neuropathy the patient has failed conservative treatments of various modalities. At this time the patient has elected to proceed with surgical correction. All alternatives, risks, and complications of the procedures were thoroughly explained to the patient. Patient exhibits appropriate understanding of all discussion points and informed consent was signed and obtained in the chart with no guarantees to surgical outcome given or implied.  Description of Procedure: Patient was brought to the operating room. Patient remained on their hospital bed in the  supine position. A surgical timeout was performed and all members of the operating room, the procedure, and the surgical site were identified. anesthesia occurred as per anesthesia record. Local anesthetic as previously described was then injected about the operative field in a local infiltrative block.  The operative lower extremity as noted above was then prepped and draped in the usual sterile manner. The following procedure then began.  Attention was directed to the right lower extremity. A racquet type incision was made over the dorsal medial aspect of the fifth metatarsal, including the entire digit. A full thickness incision was made down to bone using a #15 blade. The incision was continued through the soft tissue down to the shaft of the fifth metatarsal. At this time, no purulent drainage was noted near the metatarsal head. A deep wound culture was taken at this time and passed off the field. Using a #15 blade, the digit was then disarticulated in its entirety at the metatarsophalangeal joint and freed of all soft tissue attachments. The specimen was passed off the field and sent for gross pathology. Next, a key elevator was then used to free up the periosteum on the metatarsal shaft. Using a sagittal saw, the metatarsal was cut in a dorsal distal to plantar proximal orientation and beveled to prevent plantar pressure.   The fifth met head and fifth toe was passed off the field and sent as a gross specimen.  All remaining non-viable and necrotic tissues were sharply resected and removed. Extensor and flexors tendons were grasped with a hemostat and cut proximally. The surgical site was then flushed with of saline with pulse lavage.  The skin flap was then approximated and partially closed proximally under minimal tension with 3-0 Prolene  and skin staples.   The surgical site was then dressed with Betadine Adaptic 4 x 4 Kerlix Ace. The patient tolerated both the procedure and anesthesia well with  vital signs stable throughout. The patient was transferred in good condition and all vital signs stable  from the OR to recovery under the discretion of anesthesia.  Condition: Vital signs stable, neurovascular status unchanged from preoperative   Surgical plan:  Expect surgical cure of infection no further necrotic tissue remaining at the fifth ray amputation site.  Fifth metatarsal head was removed to allow for closure of the soft tissues which is fully closed.  Viable amputation site.  Weightbearing as tolerated to the right foot in postop shoe.  Dressing to remain clean dry and intact until 1 week postop.  Patient stable for discharge recommend 7 days oral antibiotics upon discharge  The patient will be weightbearing as tolerated in a postop shoe to the operative limb until further instructed. The dressing is to remain clean, dry, and intact. Will continue to follow unless noted elsewhere.   [name] [name], DPM [facility]

## 2022-11-22 NOTE — Assessment & Plan Note (Signed)
11-22-2022 stable

## 2022-11-22 NOTE — Progress Notes (Signed)
PROGRESS NOTE    [name] [name]  GEX:[id number] DOB: Jun 19, 1960 DOA: 11/21/2022 PCP: Saundra Shelling, NP  Subjective: Patient seen and examined.  Patient states that he fell asleep in front of a space heater in October.  He states when he woke up, his toes on his right foot were burnt.  He states over the last week, when he took off his socks, he peeled off some skin and tissue off of his foot.  He was prescribed insulin earlier this month but has not yet started it.  A1c is 9.4% indicating poor outpatient control.  Patient has no sensation in his feet.  Indicative of diabetic polyneuropathy.  Patient states that this infection in his foot has been his wake-up call to get better control of his blood sugar.  Will have inpatient diabetes instruct the patient on insulin pen injection, continuous glucose monitoring, diabetic diet.  Patient has been n.p.o. for surgery today.   Hospital Course: HPI: [name] [name] is a 62 y.o. male with medical history significant of hypertension, hyperlipidemia, diabetes, neuropathy, GERD, OSA, alcohol use with history of withdrawal, bipolar disorder, depression, anxiety, chronic pain presenting with worsening foot wound.   Patient reports that he developed foot wounds back in October.  He fell asleep with his foot to close to a space heater and woke up with blisters on his foot.  He was seen in follow-up in the ED on 11/6 after worsening foot changes including discoloration and was started on Augmentin and follow-up was arranged with podiatry outpatient.  He did see podiatry yesterday and after further investigation noted to have significant ulceration of multiple toes with gangrenous changes and cyanosis of the fifth toe.   Denies fevers, chills, chest pain, shortness of breath, abdominal pain, constipation, diarrhea, nausea, vomiting.   ED Course: Vital signs in the ED notable for heart rate in the upper 50s.  Lab workup included CMP with  sodium 130, chloride 95, glucose 319.  CBC with hemoglobin stable 12.1.  Lactic acid mildly elevated 2.6 with repeat pending.  Blood cultures pending.  Patient received vancomycin, ceftriaxone, liter IV fluids, Zofran in the ED.  EDP spoke with podiatry on-call to confirm plan for admission today and surgical intervention tomorrow and they will see the patient tomorrow.    Significant Events: Admitted 11/21/2022 for gangrene of right foot   Significant Labs: Admission Na 130, A1C 9.4%  Significant Imaging Studies:   Antibiotic Therapy: Anti-infectives (From admission, onward)    Start     Dose/Rate Route Frequency Ordered Stop   11/21/22 1730  vancomycin (VANCOREADY) IVPB 1750 mg/350 mL        1,750 mg 175 mL/hr over 120 Minutes Intravenous  Once 11/21/22 1717 11/21/22 2057   11/21/22 1715  cefTRIAXone (ROCEPHIN) 1 g in sodium chloride 0.9 % 100 mL IVPB        1 g 200 mL/hr over 30 Minutes Intravenous  Once 11/21/22 1713 11/21/22 1823       Procedures:   Consultants: Podiatry    Assessment and Plan: * Gangrene of right foot (HCC) 11-22-2022 admitted for right foot gangrene. Podiatry notes reviewed. Plan for 5th toe and/or partial ray amputation today. Remains on IV abx per podiatry recs.  Patient states that when he is discharged, he is on a go live with his brother and his sister-in-law.  He states that his sister-in-law is a Designer, jewellery at [employer].  He understands that he is getting need to keep his wound  cleaned and washed several times a day in order to prevent a postoperative infection.  Discussed with him that if he does not take good care of his wound after surgery, he is at risk for having more amputations and potentially a below the knee or above-the-knee amputation.  Hyponatremia 11-22-2022 patient on HCTZ at home.  Holding HCTZ.  Check TSH.  Repeat BMP in the morning.  Bipolar II disorder (HCC) 11-22-2022 continue Seroquel 150 mg nightly  Chronic pain  disorder 11-22-2022 continue Suboxone 8-2 1 tablet 3 times daily  Sleep apnea 11-22-2022 stable  Type 2 diabetes mellitus with diabetic polyneuropathy (HCC) 11-22-2022 Needs extensive education on insulin therapy, diet.  Patient recently prescribed insulin which she has not started yet.  Will have diabetes educator instruct the patient on continuous glucose monitoring, insulin pen administration, diet.  Patient states that he is committed to taking better care of his diabetes.  Hemoglobin A1c 9.4% indicating poor outpatient control.  GERD 11-22-2022 stable  Essential hypertension 11-22-2022 continue with Norvasc 10 mg daily, losartan 100 mg daily.  Holding HCTZ due to hyponatremia.  Depression 11-22-2022 continue Zoloft 100 mg daily  Mixed hyperlipidemia 11-22-2022 Continue Lipitor 80 mg daily   DVT prophylaxis: SCDs Start: 11/21/22 1748    Code Status: Full Code Family Communication: no family at bedside. Pt is decisional Disposition Plan: return home. Pt states he will go and stay with his brother and sister-in-law after discharge. Pt lives alone Reason for continuing need for hospitalization: going to OR today for amputation. Remains on IV ABX.  Objective: Vitals:   11/22/22 0008 11/22/22 0459 11/22/22 0500 11/22/22 0739  BP: 131/63 136/74  123/66  Pulse: (!) 53 (!) 52  (!) 51  Resp: 18 17  17   Temp: 98.5 F (36.9 C) 97.9 F (36.6 C)  98.1 F (36.7 C)  TempSrc: Oral Axillary  Oral  SpO2: 95% 99%  98%  Weight:   109.5 kg   Height:        Intake/Output Summary (Last 24 hours) at 11/22/2022 0905 Last data filed at 11/22/2022 0500 Gross per 24 hour  Intake --  Output 1675 ml  Net -1675 ml   Filed Weights   11/21/22 1325 11/22/22 0500  Weight: 106.1 kg 109.5 kg    Examination:  Physical Exam Vitals and nursing note reviewed.  Constitutional:      General: He is not in acute distress.    Appearance: He is obese. He is not toxic-appearing or diaphoretic.  HENT:      Head: Normocephalic and atraumatic.     Nose: Nose normal.  Eyes:     General: No scleral icterus. Cardiovascular:     Rate and Rhythm: Normal rate and regular rhythm.     Heart sounds: Murmur heard.  Pulmonary:     Effort: Pulmonary effort is normal. No respiratory distress.     Breath sounds: No wheezing.  Abdominal:     General: Abdomen is protuberant. Bowel sounds are normal. There is no distension.     Palpations: Abdomen is soft.  Skin:    General: Skin is warm and dry.     Comments: Right foot wrapped in bulky dressing  Neurological:     General: No focal deficit present.     Mental Status: He is alert and oriented to person, place, and time.     Data Reviewed: I have personally reviewed following labs and imaging studies  CBC: Recent Labs  Lab 11/18/22 1613 11/21/22 1333 11/22/22 1610  WBC 7.7 6.8 5.4  NEUTROABS 5.8 5.2  --   HGB 13.3 12.1* 12.0*  HCT 37.0* 35.5* 34.2*  MCV 78.1* 78.7* 79.4*  PLT 235 186 168   Basic Metabolic Panel: Recent Labs  Lab 11/18/22 1613 11/21/22 1333 11/22/22 0452  NA 129* 130* 131*  K 4.3 3.5 3.6  CL 92* 95* 96*  CO2 26 23 24   GLUCOSE 330* 319* 168*  BUN 18 16 11   CREATININE 0.84 1.00 0.83  CALCIUM 9.9 9.0 8.2*   GFR: Estimated Creatinine Clearance: 123.1 mL/min (by C-G formula based on SCr of 0.83 mg/dL). Liver Function Tests: Recent Labs  Lab 11/21/22 1333 11/22/22 0452  AST 28 20  ALT 23 16  ALKPHOS 60 47  BILITOT 0.8 0.7  PROT 7.0 5.8*  ALBUMIN 3.6 3.0*   HbA1C: Recent Labs    11/22/22 0452  HGBA1C 9.4*   CBG: Recent Labs  Lab 11/21/22 1856 11/21/22 2126 11/22/22 0738  GLUCAP 231* 247* 199*   Sepsis Labs: Recent Labs  Lab 11/21/22 1333  LATICACIDVEN 2.6*    Scheduled Meds:  amLODipine  10 mg Oral q AM   atorvastatin  80 mg Oral Daily   buprenorphine-naloxone  1 tablet Sublingual TID   folic acid  1 mg Oral Daily   insulin aspart  0-15 Units Subcutaneous TID WC   losartan  100 mg Oral  Daily   QUEtiapine  150 mg Oral QHS   sertraline  100 mg Oral Daily   sodium chloride flush  3 mL Intravenous Q12H   thiamine  100 mg Oral Daily   Continuous Infusions:   LOS: 0 days   Time spent: 35 minutes  Carollee Herter, DO  [facility]  11/22/2022, 9:05 AM

## 2022-11-22 NOTE — Subjective & Objective (Signed)
Patient seen and examined. Stable Awaiting to see podiatry for dressing change this AM. Awaiting to see inpatient diabetes educator on insulin pen administration and continuous glucose meter education.  Pt states he has an important appointment today at 3:30 pm and would like to discharge before 12 pm.

## 2022-11-22 NOTE — Anesthesia Preprocedure Evaluation (Addendum)
Anesthesia Evaluation  Patient identified by MRN, date of birth, ID band Patient awake    Reviewed: Allergy & Precautions, NPO status , Patient's Chart, lab work & pertinent test results  Airway Mallampati: I  TM Distance: >3 FB Neck ROM: Full    Dental no notable dental hx. (+) Edentulous Upper, Edentulous Lower   Pulmonary sleep apnea    Pulmonary exam normal        Cardiovascular hypertension, Pt. on medications  Rhythm:Regular Rate:Normal     Neuro/Psych   Anxiety Depression Bipolar Disorder   negative neurological ROS     GI/Hepatic Neg liver ROS,GERD  ,,  Endo/Other  diabetes, Type 2, Oral Hypoglycemic Agents    Renal/GU      Musculoskeletal  (+) Arthritis , Osteoarthritis,    Abdominal Normal abdominal exam  (+)   Peds  Hematology Lab Results      Component                Value               Date                      WBC                      5.4                 11/22/2022                HGB                      12.0 (L)            11/22/2022                HCT                      34.2 (L)            11/22/2022                MCV                      79.4 (L)            11/22/2022                PLT                      168                 11/22/2022              Anesthesia Other Findings   Reproductive/Obstetrics                             Anesthesia Physical Anesthesia Plan  ASA: 3  Anesthesia Plan: MAC   Post-op Pain Management:    Induction: Intravenous  PONV Risk Score and Plan: 1 and Ondansetron, Dexamethasone, Midazolam, Treatment may vary due to age or medical condition and Propofol infusion  Airway Management Planned: Simple Face Mask and Nasal Cannula  Additional Equipment: None  Intra-op Plan:   Post-operative Plan:   Informed Consent: I have reviewed the patients History and Physical, chart, labs and discussed the procedure including the risks, benefits  and alternatives for the proposed anesthesia with the patient or  authorized representative who has indicated his/her understanding and acceptance.     Dental advisory given  Plan Discussed with: CRNA  Anesthesia Plan Comments:        Anesthesia Quick Evaluation

## 2022-11-22 NOTE — Care Management Obs Status (Signed)
MEDICARE OBSERVATION STATUS NOTIFICATION   Patient Details  Name: [name] [name] MRN: [id number] Date of Birth: 12-14-1960   Medicare Observation Status Notification Given:  Yes    Lawerance Sabal, RN 11/22/2022, 3:43 PM

## 2022-11-22 NOTE — Assessment & Plan Note (Addendum)
11-22-2022 admitted for right foot gangrene. Podiatry notes reviewed. Plan for 5th toe and/or partial ray amputation today. Remains on IV abx per podiatry recs.  Patient states that when he is discharged, he is on a go live with his brother and his sister-in-law.  He states that his sister-in-law is a Designer, jewellery at [employer].  He understands that he is getting need to keep his wound cleaned and washed several times a day in order to prevent a postoperative infection.  Discussed with him that if he does not take good care of his wound after surgery, he is at risk for having more amputations and potentially a below the knee or above-the-knee amputation.  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 On po abx. Po dilaudid.. 11-24-2022 operative cultures show few pseudomonas and few staph aureus. Pt states he has an important meeting today that he cannot miss. Requesting discharge ASAP.  Will DC on keflex and cipro. He is f/u with podiatry this Thursday for dressing change. Pt to keep same dressing on his right foot until seen by podiatry this week.

## 2022-11-22 NOTE — Assessment & Plan Note (Signed)
11-22-2022 continue Suboxone 8-2 1 tablet 3 times daily 11-24-2022 have discharged him with small supply of po dilaudid for acute pain due to recent partial ray amputation. Pt will need to f/u with chronic pain clinic for his Suboxone.

## 2022-11-23 ENCOUNTER — Encounter (HOSPITAL_COMMUNITY): Payer: Self-pay | Admitting: Podiatry

## 2022-11-23 ENCOUNTER — Observation Stay (HOSPITAL_COMMUNITY): Payer: Medicare Other

## 2022-11-23 DIAGNOSIS — M7989 Other specified soft tissue disorders: Secondary | ICD-10-CM | POA: Diagnosis not present

## 2022-11-23 DIAGNOSIS — Z9889 Other specified postprocedural states: Secondary | ICD-10-CM | POA: Diagnosis not present

## 2022-11-23 DIAGNOSIS — E876 Hypokalemia: Secondary | ICD-10-CM

## 2022-11-23 DIAGNOSIS — E1142 Type 2 diabetes mellitus with diabetic polyneuropathy: Secondary | ICD-10-CM | POA: Diagnosis not present

## 2022-11-23 DIAGNOSIS — I96 Gangrene, not elsewhere classified: Secondary | ICD-10-CM | POA: Diagnosis not present

## 2022-11-23 DIAGNOSIS — E871 Hypo-osmolality and hyponatremia: Secondary | ICD-10-CM | POA: Diagnosis not present

## 2022-11-23 LAB — COMPREHENSIVE METABOLIC PANEL
ALT: 19 U/L (ref 0–44)
AST: 16 U/L (ref 15–41)
Albumin: 3.2 g/dL — ABNORMAL LOW (ref 3.5–5.0)
Alkaline Phosphatase: 51 U/L (ref 38–126)
Anion gap: 10 (ref 5–15)
BUN: 13 mg/dL (ref 8–23)
CO2: 25 mmol/L (ref 22–32)
Calcium: 8.6 mg/dL — ABNORMAL LOW (ref 8.9–10.3)
Chloride: 99 mmol/L (ref 98–111)
Creatinine, Ser: 0.77 mg/dL (ref 0.61–1.24)
GFR, Estimated: >60 mL/min (ref >60)
Glucose, Bld: 179 mg/dL — ABNORMAL HIGH (ref 70–99)
Potassium: 3.4 mmol/L — ABNORMAL LOW (ref 3.5–5.1)
Sodium: 134 mmol/L — ABNORMAL LOW (ref 135–145)
Total Bilirubin: 0.9 mg/dL (ref <1.2)
Total Protein: 6 g/dL — ABNORMAL LOW (ref 6.5–8.1)

## 2022-11-23 LAB — CBC WITH DIFFERENTIAL/PLATELET
Abs Immature Granulocytes: 0.03 10*3/uL (ref 0.00–0.07)
Basophils Absolute: 0.1 10*3/uL (ref 0.0–0.1)
Basophils Relative: 1 %
Eosinophils Absolute: 0.1 10*3/uL (ref 0.0–0.5)
Eosinophils Relative: 2 %
HCT: 33.6 % — ABNORMAL LOW (ref 39.0–52.0)
Hemoglobin: 11.4 g/dL — ABNORMAL LOW (ref 13.0–17.0)
Immature Granulocytes: 1 %
Lymphocytes Relative: 24 %
Lymphs Abs: 1.6 10*3/uL (ref 0.7–4.0)
MCH: 26.8 pg (ref 26.0–34.0)
MCHC: 33.9 g/dL (ref 30.0–36.0)
MCV: 79.1 fL — ABNORMAL LOW (ref 80.0–100.0)
Monocytes Absolute: 0.4 10*3/uL (ref 0.1–1.0)
Monocytes Relative: 7 %
Neutro Abs: 4.3 10*3/uL (ref 1.7–7.7)
Neutrophils Relative %: 65 %
Platelets: 170 10*3/uL (ref 150–400)
RBC: 4.25 MIL/uL (ref 4.22–5.81)
RDW: 12.9 % (ref 11.5–15.5)
WBC: 6.5 10*3/uL (ref 4.0–10.5)
nRBC: 0 % (ref 0.0–0.2)

## 2022-11-23 LAB — GLUCOSE, CAPILLARY
Glucose-Capillary: 173 mg/dL — ABNORMAL HIGH (ref 70–99)
Glucose-Capillary: 213 mg/dL — ABNORMAL HIGH (ref 70–99)
Glucose-Capillary: 254 mg/dL — ABNORMAL HIGH (ref 70–99)
Glucose-Capillary: 303 mg/dL — ABNORMAL HIGH (ref 70–99)

## 2022-11-23 MED ORDER — INSULIN GLARGINE-YFGN 100 UNIT/ML ~~LOC~~ SOLN
8.0000 [IU] | Freq: Every day | SUBCUTANEOUS | Status: DC
  Administered 2022-11-23 – 2022-11-24 (×2): 8 [IU] via SUBCUTANEOUS
  Filled 2022-11-23 (×2): qty 0.08

## 2022-11-23 MED ORDER — HYDROMORPHONE HCL 2 MG PO TABS: 2.0000 mg | ORAL_TABLET | Freq: Four times a day (QID) | ORAL | 0 refills | Status: AC | PRN

## 2022-11-23 MED ORDER — METRONIDAZOLE 500 MG PO TABS: 500.0000 mg | ORAL_TABLET | Freq: Two times a day (BID) | ORAL | 0 refills | Status: DC

## 2022-11-23 MED ORDER — CEFADROXIL 500 MG PO CAPS: 500.0000 mg | ORAL_CAPSULE | Freq: Two times a day (BID) | ORAL | 0 refills | Status: DC

## 2022-11-23 MED ORDER — POTASSIUM CHLORIDE CRYS ER 20 MEQ PO TBCR
40.0000 meq | EXTENDED_RELEASE_TABLET | ORAL | Status: AC
  Administered 2022-11-23 (×2): 40 meq via ORAL
  Filled 2022-11-23 (×2): qty 2

## 2022-11-23 NOTE — Plan of Care (Signed)

## 2022-11-23 NOTE — Assessment & Plan Note (Signed)
11-23-2022 replete with po kcl

## 2022-11-23 NOTE — Progress Notes (Signed)
  Subjective:  Patient ID: [name] [name], male    DOB: [birth year],  MRN: [id number]  Chief Complaint  Patient presents with   Recurrent Skin Infections    DOS: 11/22/22 Procedure: Right foot partial 5th ray amputation for gangrenous toe  62 y.o. male POD #1 s/p above proc. Reports some pain this AM though otherwise doing oK. Has been elevating his foot.   Review of Systems: Negative except as noted in the HPI. Denies N/V/F/Ch.   Objective:   Vitals:   11/22/22 2058 11/23/22 0508  BP:  111/62  Pulse:  (!) 52  Resp:  18  Temp: 98.1 F (36.7 C) 99.9 F (37.7 C)  SpO2:  96%   Body mass index is 30.98 kg/m. Constitutional Well developed. Well nourished.  Vascular Foot warm and well perfused. Capillary refill normal to all digits.  Calf is soft and supple, no posterior calf or knee pain, negative Homans' sign  Neurologic Normal speech. Oriented to person, place, and time. Epicritic sensation to light touch grossly absent to right foot.  Dermatologic Dressing C/D/I  Orthopedic: Minimal pain w palpation noted about the surgical site.   Multiple view plain film radiographs: pending Assessment:   1. Toe infection    Plan:  Patient was evaluated and treated and all questions answered.  S/p foot surgery right -Progressing as expected post-operatively. -Expect surgical cure of infection no further necrotic tissue remaining at the fifth ray amputation site.  Fifth metatarsal head was removed to allow for closure of the soft tissues which is fully closed.  Viable amputation site immediate post op.   -XR: as above -WB Status: WBAT in post op shoe though discussed minimal WB with patient, keep foot elevated. - Dsg change tomorrow if time permits by on call [facility] doc. Does not need to delay discharge. If no dsg change tmrw follow up in office Thursday for dsg change.  -Sutures: remain intact. -Medications: pain meds PRN - Pt stable for DC pending arrangement of PO abx for  home        Corinna Gab, DPM [facility] / [facility]
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
## 2022-11-23 NOTE — TOC Initial Note (Signed)
Transition of Care [facility]) - Initial/Assessment Note    Patient Details  Name: [name] [name] MRN: [id number] Date of Birth: 04-07-1960  [facility]) CM/SW Contact:    Lawerance Sabal, RN Phone Number: 11/23/2022, 9:52 AM  Clinical Narrative:                  Rollator to be delivered to the room through [employer]        Patient Goals and CMS Choice            Expected Discharge Plan and Services                         DME Arranged: Walker rolling with seat DME Agency: [employer] Date DME Agency Contacted: 11/23/22 Time DME Agency Contacted: [phone] Representative spoke with at DME Agency: Vaughan Basta            Prior Living Arrangements/Services                       Activities of Daily Living   ADL Screening (condition at time of admission) Independently performs ADLs?: Yes (appropriate for developmental age) Is the patient deaf or have difficulty hearing?: No Does the patient have difficulty seeing, even when wearing glasses/contacts?: No Does the patient have difficulty concentrating, remembering, or making decisions?: No  Permission Sought/Granted                  Emotional Assessment              Admission diagnosis:  Toe infection [L08.9] Gangrene of right foot (HCC) [I96] Patient Active Problem List   Diagnosis Date Noted   Hyponatremia 11/22/2022   Gangrene of right foot (HCC) 11/21/2022   Bipolar II disorder (HCC) 08/04/2021   Alcohol abuse with alcohol-induced disorder (HCC) 08/04/2021   Transaminitis 08/02/2021   Hypokalemia 08/02/2021   Alcohol withdrawal (HCC) 06/30/2021   Alcoholic ketoacidosis 06/30/2021   Diabetic foot infection (HCC) 04/26/2020   Penetrating wound of left foot    Skin lesion 12/07/2019   Chronic pain disorder 09/13/2019   DDD (degenerative disc disease), lumbar 09/13/2019   Dyslipidemia 09/13/2019   Obesity 09/13/2019   Sleep apnea    Prolonged Q-T interval on ECG 12/03/2016    Drug-induced torsades de pointes (HCC) 12/03/2016   Thrombocytopenia (HCC) 11/29/2016   Personal history of ECMO 11/26/2016   Personal history of spine surgery 11/26/2016   S/P rotator cuff repair 11/26/2016   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 11/13/2016   Mild concentric left ventricular hypertrophy (LVH) 09/11/2016   DJD (degenerative joint disease) of cervical spine 02/26/2016   Tinea pedis of both feet 06/03/2015   Pre-ulcerative calluses 06/01/2015   Type 2 diabetes mellitus with diabetic polyneuropathy (HCC) 06/09/2014   Generalized anxiety disorder 06/19/2012   Herpes zoster 12/05/2008   Mixed hyperlipidemia 05/11/2008   Depression 05/11/2008   Essential hypertension 05/11/2008   GERD 05/11/2008   Osteoarthritis 05/11/2008   LOW BACK PAIN 05/11/2008   History of colonic polyps 05/11/2008   PCP:  Saundra Shelling, NP Pharmacy:   [id number] [place], [place] - [address] AT [facility] OF [address] & [address] [place] [address] Phone: [phone] Fax: [phone]     Social Determinants of Health (SDOH) Social History: SDOH Screenings   Food Insecurity: Patient Declined (11/21/2022)  Housing: Patient Declined (  11/21/2022)  Transportation Needs: Patient Declined (11/21/2022)  Utilities: Patient Declined (11/21/2022)  Depression (PHQ2-9): High Risk (01/25/2021)  Tobacco Use: Low Risk  (11/22/2022)   SDOH Interventions:     Readmission Risk Interventions    08/06/2021   11:32 AM  Readmission Risk Prevention Plan  Transportation Screening Complete  PCP or Specialist Appt within 3-5 Days Complete  HRI or Home Care Consult Complete  Social Work Consult for Recovery Care Planning/Counseling Complete  Palliative Care Screening Not Applicable  Medication Review Oceanographer) Complete

## 2022-11-23 NOTE — Progress Notes (Signed)
Orthopedic Tech Progress Note Patient Details:  [name] [name] October 28, 1960 [id number]  Patient ID: [name], male   DOB: 1960/11/10, 62 y.o.   MRN: [id number] Patient already has a POS per RN. Darleen Crocker 11/23/2022, 10:46 AM

## 2022-11-24 DIAGNOSIS — E876 Hypokalemia: Secondary | ICD-10-CM | POA: Diagnosis not present

## 2022-11-24 DIAGNOSIS — I10 Essential (primary) hypertension: Secondary | ICD-10-CM | POA: Diagnosis not present

## 2022-11-24 DIAGNOSIS — E782 Mixed hyperlipidemia: Secondary | ICD-10-CM | POA: Diagnosis not present

## 2022-11-24 DIAGNOSIS — I96 Gangrene, not elsewhere classified: Secondary | ICD-10-CM | POA: Diagnosis not present

## 2022-11-24 DIAGNOSIS — G894 Chronic pain syndrome: Secondary | ICD-10-CM | POA: Diagnosis not present

## 2022-11-24 DIAGNOSIS — E1142 Type 2 diabetes mellitus with diabetic polyneuropathy: Secondary | ICD-10-CM | POA: Diagnosis not present

## 2022-11-24 DIAGNOSIS — E871 Hypo-osmolality and hyponatremia: Secondary | ICD-10-CM | POA: Diagnosis not present

## 2022-11-24 LAB — GLUCOSE, CAPILLARY: Glucose-Capillary: 172 mg/dL — ABNORMAL HIGH (ref 70–99)

## 2022-11-24 MED ORDER — INSULIN LISPRO (1 UNIT DIAL) 100 UNIT/ML (KWIKPEN): PEN_INJECTOR | SUBCUTANEOUS | 0 refills | Status: DC

## 2022-11-24 MED ORDER — LANTUS SOLOSTAR 100 UNIT/ML ~~LOC~~ SOPN: 12.0000 [IU] | PEN_INJECTOR | Freq: Every day | SUBCUTANEOUS | 0 refills | Status: AC

## 2022-11-24 MED ORDER — CIPROFLOXACIN HCL 250 MG PO TABS: 750.0000 mg | ORAL_TABLET | Freq: Two times a day (BID) | ORAL | 0 refills | Status: AC

## 2022-11-24 MED ORDER — CEPHALEXIN 500 MG PO CAPS: 500.0000 mg | ORAL_CAPSULE | Freq: Three times a day (TID) | ORAL | 0 refills | Status: AC

## 2022-11-24 NOTE — Progress Notes (Addendum)
  Subjective:  Patient ID: [name] [name], male    DOB: 07/01/1960,  MRN: [id number]  POD #2 right partial fifth ray amputation  Doing well not have any pain  Negative for chest pain and shortness of breath Fever: no Night sweats: no Constitutional signs: no Objective:   Vitals:   11/24/22 0619 11/24/22 0739  BP: 120/67 116/72  Pulse: (!) 47 (!) 56  Resp: 16 17  Temp: 98.7 F (37.1 C) 98.3 F (36.8 C)  SpO2: 97% 99%   General AA&O x3. Normal mood and affect.  Vascular Dorsalis pedis and posterior tibial pulses 2/4 bilat. Brisk capillary refill to all digits. Pedal hair present.  Neurologic Epicritic sensation grossly intact.  Dermatologic Incision well coapted erythema is resolving there is some maceration  Orthopedic: MMT 5/5 in dorsiflexion, plantarflexion, inversion, and eversion. Normal joint ROM without pain or crepitus.    Assessment & Plan:  Patient was evaluated and treated and all questions answered.  Right fifth ray amputation -Weight-bear as tolerated advised to focus weight on the heel and try to rest is much as possible -Recommend he change dressing daily with Betadine to incision and wounds -Follow-up with scheduled for this Thursday with Dr. Annamary Rummage -Recommend 1 week of oral antibiotics at discharge culture has Pseudomonas and staph would recommend doxycycline and Levaquin at discharge -Okay to discharge from our standpoint  Edwin Cap, DPM  Accessible via secure chat for questions or concerns.

## 2022-11-24 NOTE — Progress Notes (Signed)
Discharge instructions given. Patient verbalized understanding and all questions were answered.  ?

## 2022-11-24 NOTE — Discharge Summary (Signed)
[facility] Physician Discharge Summary   Patient name: [name] [name]  Admit date:     11/21/2022  Discharge date: 11/24/2022  Attending Physician: Synetta Fail [[id number]]  Discharge Physician: Carollee Herter   PCP: Micki Riley, Harlin Rain, NP  Admitted From: Home  Disposition:  Home  Recommendations for Outpatient Follow-up:  Follow up with PCP in 1-2 weeks Follow up with podiatry this Thursday, November 27, 2022 Please follow up on the following pending results: operative wound cx from 11-22-2022  Home Health:No Equipment/Devices: rolling walker    Discharge Condition:Stable CODE STATUS:FULL Diet recommendation: Diabetic Fluid Restriction: None  Hospital Summary: HPI: [name] is a 62 y.o. male with medical history significant of hypertension, hyperlipidemia, diabetes, neuropathy, GERD, OSA, alcohol use with history of withdrawal, bipolar disorder, depression, anxiety, chronic pain presenting with worsening foot wound.   Patient reports that he developed foot wounds back in October.  He fell asleep with his foot to close to a space heater and woke up with blisters on his foot.  He was seen in follow-up in the ED on 11/6 after worsening foot changes including discoloration and was started on Augmentin and follow-up was arranged with podiatry outpatient.  He did see podiatry yesterday and after further investigation noted to have significant ulceration of multiple toes with gangrenous changes and cyanosis of the fifth toe.   Denies fevers, chills, chest pain, shortness of breath, abdominal pain, constipation, diarrhea, nausea, vomiting.   ED Course: Vital signs in the ED notable for heart rate in the upper 50s.  Lab workup included CMP with sodium 130, chloride 95, glucose 319.  CBC with hemoglobin stable 12.1.  Lactic acid mildly elevated 2.6 with repeat pending.  Blood cultures pending.  Patient received vancomycin, ceftriaxone, liter IV fluids, Zofran in  the ED.  EDP spoke with podiatry on-call to confirm plan for admission today and surgical intervention tomorrow and they will see the patient tomorrow.    Significant Events: Admitted 11/21/2022 for gangrene of right foot   Significant Labs: Admission Na 130, A1C 9.4%  Significant Imaging Studies:   Antibiotic Therapy: Anti-infectives (From admission, onward)    Start     Dose/Rate Route Frequency Ordered Stop   11/21/22 1730  vancomycin (VANCOREADY) IVPB 1750 mg/350 mL        1,750 mg 175 mL/hr over 120 Minutes Intravenous  Once 11/21/22 1717 11/21/22 2057   11/21/22 1715  cefTRIAXone (ROCEPHIN) 1 g in sodium chloride 0.9 % 100 mL IVPB        1 g 200 mL/hr over 30 Minutes Intravenous  Once 11/21/22 1713 11/21/22 1823       Procedures:   Consultants: [facility] Course by Problem: * Gangrene of right foot (HCC) 11-22-2022 admitted for right foot gangrene. Podiatry notes reviewed. Plan for 5th toe and/or partial ray amputation today. Remains on IV abx per podiatry recs.  Patient states that when he is discharged, he is on a go live with his brother and his sister-in-law.  He states that his sister-in-law is a Designer, jewellery at [employer].  He understands that he is getting need to keep his wound cleaned and washed several times a day in order to prevent a postoperative infection.  Discussed with him that if he does not take good care of his wound after surgery, he is at risk for having more amputations and potentially a below the knee or above-the-knee amputation.  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 On po abx. Po dilaudid.. 11-24-2022 operative cultures show few pseudomonas and few staph aureus. Pt states he has an important meeting today that he cannot miss. Requesting discharge ASAP.  Will DC on keflex and cipro. He is f/u with podiatry this Thursday for dressing change. Pt to keep same dressing on his right foot until seen by podiatry  this week.  Hyponatremia 11-22-2022 patient on HCTZ at home.  Holding HCTZ.  Check TSH.  Repeat BMP in the morning. 11-23-2022 improved. Hold hydrochlorothiazide at discharge  Hypokalemia 11-23-2022 replete with po kcl  Type 2 diabetes mellitus with diabetic polyneuropathy (HCC) 11-22-2022 Needs extensive education on insulin therapy, diet.  Patient recently prescribed insulin which she has not started yet.  Will have diabetes educator instruct the patient on continuous glucose monitoring, insulin pen administration, diet.  Patient states that he is committed to taking better care of his diabetes.  Hemoglobin A1c 9.4% indicating poor outpatient control. 11-23-2022 pt needs DM education. A1c 9.4% indication poor outpatient control. Pt states he is determined to get his DM under control. Add 8 unit SQ lantus daily.  11-24-2022 will increase DC lantus dose to 12 units every day. Add SSI to outpatient meds. Awaiting inpatient diabetes educator if possible with his time constraints.  Bipolar II disorder (HCC) 11-22-2022 continue Seroquel 150 mg nightly  Chronic pain disorder 11-22-2022 continue Suboxone 8-2 1 tablet 3 times daily 11-24-2022 have discharged him with small supply of po dilaudid for acute pain due to recent partial ray amputation. Pt will need to f/u with chronic pain clinic for his Suboxone.  Sleep apnea 11-22-2022 stable  GERD 11-22-2022 stable  Essential hypertension 11-22-2022 continue with Norvasc 10 mg daily, losartan 100 mg daily.  Holding HCTZ due to hyponatremia.  Depression 11-22-2022 continue Zoloft 100 mg daily  Mixed hyperlipidemia 11-22-2022 Continue Lipitor 80 mg daily    Discharge Diagnoses:  Principal Problem:   Gangrene of right foot (HCC) Active Problems:   Type 2 diabetes mellitus with diabetic polyneuropathy (HCC)   Hypokalemia   Hyponatremia   Mixed hyperlipidemia   Depression   Essential hypertension   GERD   Sleep apnea   Chronic pain  disorder   Bipolar II disorder [facility])   Discharge Instructions  Discharge Instructions     Call MD for:  difficulty breathing, headache or visual disturbances   Complete by: As directed    Call MD for:  persistant dizziness or light-headedness   Complete by: As directed    Call MD for:  persistant nausea and vomiting   Complete by: As directed    Call MD for:  redness, tenderness, or signs of infection (pain, swelling, redness, odor or green/yellow discharge around incision site)   Complete by: As directed    Call MD for:  severe uncontrolled pain   Complete by: As directed    Call MD for:  temperature >100.4   Complete by: As directed    Diet - low sodium heart healthy   Complete by: As directed    Discharge instructions   Complete by: As directed    1. Follow up with your primary care provider in 1-2 weeks following discharge from hospital 2. Follow up with podiatry on Thursday, November 27, 2022. Call office for appointment. 3. Keep dressing on foot intact(do not change it) until you are seen in podiatry office this week. 4. Do not get the dressing wet or soiled. Keep your right foot off the ground until you are seen  by podiatry.   Discharge wound care:   Complete by: As directed    1. Keep right foot dressing intacted(I.e do not change dressing) until seen by podiatry in clinic this week. 2. Keep your right foot off the ground so you don't soil your dressing, until seen by podiatry 3. Ask your podiatrist in clinic this week on how to clean your surgical wound when dressing is changed.   Increase activity slowly   Complete by: As directed    Leave dressing on - Keep it clean, dry, and intact until clinic visit   Complete by: As directed       Allergies as of 11/24/2022       Reactions   Heparin Other (See Comments)   HIT Ab negative on 02/20/15, but SRA POSITIVE    Oxytetracycline Rash, Other (See Comments)   Del-mycin [erythromycin] Other (See Comments)   All "mycin  drugs" - unknown reaction   Sulfa Antibiotics Other (See Comments)   Unknown reaction        Medication List     STOP taking these medications    amoxicillin-clavulanate 875-125 MG tablet Commonly known as: AUGMENTIN   doxycycline 100 MG capsule Commonly known as: VIBRAMYCIN   glipiZIDE 10 MG tablet Commonly known as: GLUCOTROL   hydrochlorothiazide 25 MG tablet Commonly known as: HYDRODIURIL       TAKE these medications    amLODipine 10 MG tablet Commonly known as: NORVASC Take 1 tablet (10 mg total) by mouth daily. What changed: when to take this   atorvastatin 80 MG tablet Commonly known as: LIPITOR Take 80 mg by mouth daily.   baclofen 10 MG tablet Commonly known as: LIORESAL Take 5 mg by mouth at bedtime as needed for muscle spasms.   blood glucose meter kit and supplies Kit Dispense based on patient and insurance preference. Use up to four times daily as directed.   buprenorphine-naloxone 8-2 mg Subl SL tablet Commonly known as: SUBOXONE Place 1 tablet under the tongue 3 (three) times daily.   cephALEXin 500 MG capsule Commonly known as: KEFLEX Take 1 capsule (500 mg total) by mouth 3 (three) times daily for 5 days.   ciprofloxacin 250 MG tablet Commonly known as: Cipro Take 3 tablets (750 mg total) by mouth 2 (two) times daily for 5 days.   HYDROmorphone 2 MG tablet Commonly known as: DILAUDID Take 1-2 tablets (2-4 mg total) by mouth every 6 (six) hours as needed for up to 5 days for moderate pain (pain score 4-6) or severe pain (pain score 7-10) (2 mg for moderate pain, 4 mg for severe pain).   insulin lispro 100 UNIT/ML KwikPen Commonly known as: HUMALOG CBG 70 - 120: 0 units CBG 121 - 150: 2 units CBG 151 - 200: 3 units CBG 201 - 250: 5 units CBG 251 - 300: 8 units CBG 301 - 350: 11 units CBG 351 - 400: 15 units CBG > 400: give 15 units and call PCP   Lantus SoloStar 100 UNIT/ML Solostar Pen Generic drug: insulin glargine Inject 12 Units  into the skin daily. What changed: how much to take   losartan 100 MG tablet Commonly known as: COZAAR Take 100 mg by mouth daily.   metFORMIN 500 MG tablet Commonly known as: GLUCOPHAGE Take 500 mg by mouth 2 (two) times daily.   Ozempic (1 MG/DOSE) 4 MG/3ML Sopn Generic drug: Semaglutide (1 MG/DOSE) Inject into the skin.   QUEtiapine 300 MG tablet Commonly known as: SEROQUEL  Take 150 mg by mouth at bedtime.   sertraline 100 MG tablet Commonly known as: ZOLOFT Take 100 mg by mouth daily.   silver sulfADIAZINE 1 % cream Commonly known as: Silvadene Apply 1 Application topically daily.   thiamine 100 MG tablet Commonly known as: Vitamin B-1 Take 100 mg by mouth daily.               Durable Medical Equipment  (From admission, onward)           Start     Ordered   11/23/22 0759  For home use only DME 4 wheeled rolling walker with seat  Once       Question:  Patient needs a walker to treat with the following condition  Answer:  History of partial ray amputation of fifth toe of right foot [facility])   11/23/22 0759              Discharge Care Instructions  (From admission, onward)           Start     Ordered   11/24/22 0000  Discharge wound care:       Comments: 1. Keep right foot dressing intacted(I.e do not change dressing) until seen by podiatry in clinic this week. 2. Keep your right foot off the ground so you don't soil your dressing, until seen by podiatry 3. Ask your podiatrist in clinic this week on how to clean your surgical wound when dressing is changed.   11/24/22 0815   11/24/22 0000  Leave dressing on - Keep it clean, dry, and intact until clinic visit        11/24/22 0815            Allergies  Allergen Reactions   Heparin Other (See Comments)    HIT Ab negative on 02/20/15, but SRA POSITIVE    Oxytetracycline Rash and Other (See Comments)   Del-Mycin [Erythromycin] Other (See Comments)    All "mycin drugs" - unknown reaction    Sulfa Antibiotics Other (See Comments)    Unknown reaction    Discharge Exam: Vitals:   11/24/22 0619 11/24/22 0739  BP: 120/67 116/72  Pulse: (!) 47 (!) 56  Resp: 16 17  Temp: 98.7 F (37.1 C) 98.3 F (36.8 C)  SpO2: 97% 99%    Physical Exam Vitals and nursing note reviewed.  Constitutional:      Appearance: He is not toxic-appearing or diaphoretic.  HENT:     Head: Normocephalic and atraumatic.     Nose: Nose normal.  Eyes:     General: No scleral icterus. Pulmonary:     Effort: Pulmonary effort is normal. No respiratory distress.  Abdominal:     General: There is no distension.  Skin:    Comments: Right foot wrapped in bulky dressing  Neurological:     Mental Status: He is alert and oriented to person, place, and time.     The results of significant diagnostics from this hospitalization (including imaging, microbiology, ancillary and laboratory) are listed below for reference.    Microbiology: Recent Results (from the past 240 hour(s))  Blood culture (routine x 2)     Status: None (Preliminary result)   Collection Time: 11/21/22  5:26 PM   Specimen: BLOOD RIGHT HAND  Result Value Ref Range Status   Specimen Description BLOOD RIGHT HAND  Final   Special Requests   Final    BOTTLES DRAWN AEROBIC AND ANAEROBIC Blood Culture adequate volume   Culture  Final    NO GROWTH 3 DAYS Performed at [facility], [address], [place], [place] [address]    Report Status PENDING  Incomplete  Blood culture (routine x 2)     Status: None (Preliminary result)   Collection Time: 11/21/22 10:38 PM   Specimen: BLOOD  Result Value Ref Range Status   Specimen Description BLOOD BLOOD RIGHT HAND  Final   Special Requests   Final    BOTTLES DRAWN AEROBIC AND ANAEROBIC Blood Culture adequate volume   Culture   Final    NO GROWTH 3 DAYS Performed at [facility], [address] [place], [place] [address]    Report Status PENDING  Incomplete  Aerobic/Anaerobic  Culture w Gram Stain (surgical/deep wound)     Status: None (Preliminary result)   Collection Time: 11/22/22 11:18 AM   Specimen: PATH Bone biopsy; Tissue  Result Value Ref Range Status   Specimen Description TISSUE  Final   Special Requests right fifth ray amputation  Final   Gram Stain FEW WBC SEEN MODERATE GRAM POSITIVE COCCI   Final   Culture   Final    FEW PSEUDOMONAS AERUGINOSA FEW STAPHYLOCOCCUS AUREUS CULTURE REINCUBATED FOR BETTER GROWTH Performed at [facility], [address], [place], [place] [address]    Report Status PENDING  Incomplete     Labs:   Basic Metabolic Panel: Recent Labs  Lab 11/18/22 1613 11/21/22 1333 11/22/22 0452 11/23/22 0522  NA 129* 130* 131* 134*  K 4.3 3.5 3.6 3.4*  CL 92* 95* 96* 99  CO2 26 23 24 25   GLUCOSE 330* 319* 168* 179*  BUN 18 16 11 13   CREATININE 0.84 1.00 0.83 0.77  CALCIUM 9.9 9.0 8.2* 8.6*   Liver Function Tests: Recent Labs  Lab 11/21/22 1333 11/22/22 0452 11/23/22 0522  AST 28 20 16   ALT 23 16 19   ALKPHOS 60 47 51  BILITOT 0.8 0.7 0.9  PROT 7.0 5.8* 6.0*  ALBUMIN 3.6 3.0* 3.2*   CBC: Recent Labs  Lab 11/18/22 1613 11/21/22 1333 11/22/22 0452 11/23/22 0522  WBC 7.7 6.8 5.4 6.5  NEUTROABS 5.8 5.2  --  4.3  HGB 13.3 12.1* 12.0* 11.4*  HCT 37.0* 35.5* 34.2* 33.6*  MCV 78.1* 78.7* 79.4* 79.1*  PLT 235 186 168 170   CBG: Recent Labs  Lab 11/23/22 0730 11/23/22 1108 11/23/22 1533 11/23/22 2057 11/24/22 0738  GLUCAP 173* 213* 254* 303* 172*   Hgb A1c Recent Labs    11/22/22 0452  HGBA1C 9.4*   Thyroid function studies Recent Labs    11/22/22 1507  TSH 1.749   Sepsis Labs Recent Labs  Lab 11/18/22 1613 11/21/22 1333 11/22/22 0452 11/23/22 0522  WBC 7.7 6.8 5.4 6.5   Microbiology Recent Results (from the past 240 hour(s))  Blood culture (routine x 2)     Status: None (Preliminary result)   Collection Time: 11/21/22  5:26 PM   Specimen: BLOOD RIGHT HAND  Result Value Ref  Range Status   Specimen Description BLOOD RIGHT HAND  Final   Special Requests   Final    BOTTLES DRAWN AEROBIC AND ANAEROBIC Blood Culture adequate volume   Culture   Final    NO GROWTH 3 DAYS Performed at [facility], [address] [place], [place] [address]    Report Status PENDING  Incomplete  Blood culture (routine x 2)     Status: None (Preliminary result)   Collection Time:  11/21/22 10:38 PM   Specimen: BLOOD  Result Value Ref Range Status   Specimen Description BLOOD BLOOD RIGHT HAND  Final   Special Requests   Final    BOTTLES DRAWN AEROBIC AND ANAEROBIC Blood Culture adequate volume   Culture   Final    NO GROWTH 3 DAYS Performed at [facility], [address], [place], [place] [address]    Report Status PENDING  Incomplete  Aerobic/Anaerobic Culture w Gram Stain (surgical/deep wound)     Status: None (Preliminary result)   Collection Time: 11/22/22 11:18 AM   Specimen: PATH Bone biopsy; Tissue  Result Value Ref Range Status   Specimen Description TISSUE  Final   Special Requests right fifth ray amputation  Final   Gram Stain FEW WBC SEEN MODERATE GRAM POSITIVE COCCI   Final   Culture   Final    FEW PSEUDOMONAS AERUGINOSA FEW STAPHYLOCOCCUS AUREUS CULTURE REINCUBATED FOR BETTER GROWTH Performed at [facility], [address] [place], [place] [address]    Report Status PENDING  Incomplete    Procedures/Studies: DG Foot 2 Views Right  Result Date: 11/23/2022 CLINICAL DATA:  Status post partial ray resection of the fifth digit. EXAM: RIGHT FOOT - 2 VIEW COMPARISON:  Foot radiograph dated 11/18/2022. FINDINGS: The patient is status post a fifth ray resection at the level of the metatarsal shaft. Otherwise, there is no evidence of fracture or dislocation. Plantar and posterior calcaneal enthesophytes are noted. Degenerative changes are seen in the dorsal midfoot. Soft tissue swelling around the operative bed. IMPRESSION: Status post fifth ray  resection at the level of the metatarsal shaft. Electronically Signed   By: Romona Curls M.D.   On: 11/23/2022 11:34   DG Foot Complete Right  Result Date: 11/18/2022 CLINICAL DATA:  Diabetic foot wound, diabetic neuropathy EXAM: RIGHT FOOT COMPLETE - 3+ VIEW COMPARISON:  None Available. FINDINGS: There is no evidence of fracture or dislocation. There is no evidence of arthropathy or other focal bone abnormality. Soft tissues are unremarkable. No bone destruction to suggest osteomyelitis. IMPRESSION: No acute bony abnormality. Electronically Signed   By: Charlett Nose M.D.   On: 11/18/2022 20:02    Time coordinating discharge: 40 mins  SIGNED:  Carollee Herter, DO [facility] 11/24/22, 8:24 AM

## 2022-11-24 NOTE — Progress Notes (Signed)
PROGRESS NOTE    [name] [name]  ZOX:[id number] DOB: March 29, 1960 DOA: 11/21/2022 PCP: Saundra Shelling, NP  Subjective: Patient seen and examined. Stable Awaiting to see podiatry for dressing change this AM. Awaiting to see inpatient diabetes educator on insulin pen administration and continuous glucose meter education.  Pt states he has an important appointment today at 3:30 pm and would like to discharge before 12 pm.   Hospital Course: HPI: [name] [name] is a 62 y.o. male with medical history significant of hypertension, hyperlipidemia, diabetes, neuropathy, GERD, OSA, alcohol use with history of withdrawal, bipolar disorder, depression, anxiety, chronic pain presenting with worsening foot wound.   Patient reports that he developed foot wounds back in October.  He fell asleep with his foot to close to a space heater and woke up with blisters on his foot.  He was seen in follow-up in the ED on 11/6 after worsening foot changes including discoloration and was started on Augmentin and follow-up was arranged with podiatry outpatient.  He did see podiatry yesterday and after further investigation noted to have significant ulceration of multiple toes with gangrenous changes and cyanosis of the fifth toe.   Denies fevers, chills, chest pain, shortness of breath, abdominal pain, constipation, diarrhea, nausea, vomiting.   ED Course: Vital signs in the ED notable for heart rate in the upper 50s.  Lab workup included CMP with sodium 130, chloride 95, glucose 319.  CBC with hemoglobin stable 12.1.  Lactic acid mildly elevated 2.6 with repeat pending.  Blood cultures pending.  Patient received vancomycin, ceftriaxone, liter IV fluids, Zofran in the ED.  EDP spoke with podiatry on-call to confirm plan for admission today and surgical intervention tomorrow and they will see the patient tomorrow.    Significant Events: Admitted 11/21/2022 for gangrene of right foot   Significant  Labs: Admission Na 130, A1C 9.4%  Significant Imaging Studies:   Antibiotic Therapy: Anti-infectives (From admission, onward)    Start     Dose/Rate Route Frequency Ordered Stop   11/21/22 1730  vancomycin (VANCOREADY) IVPB 1750 mg/350 mL        1,750 mg 175 mL/hr over 120 Minutes Intravenous  Once 11/21/22 1717 11/21/22 2057   11/21/22 1715  cefTRIAXone (ROCEPHIN) 1 g in sodium chloride 0.9 % 100 mL IVPB        1 g 200 mL/hr over 30 Minutes Intravenous  Once 11/21/22 1713 11/21/22 1823       Procedures:   Consultants: Podiatry    Assessment and Plan: * Gangrene of right foot (HCC) 11-22-2022 admitted for right foot gangrene. Podiatry notes reviewed. Plan for 5th toe and/or partial ray amputation today. Remains on IV abx per podiatry recs.  Patient states that when he is discharged, he is on a go live with his brother and his sister-in-law.  He states that his sister-in-law is a Designer, jewellery at [employer].  He understands that he is getting need to keep his wound cleaned and washed several times a day in order to prevent a postoperative infection.  Discussed with him that if he does not take good care of his wound after surgery, he is at risk for having more amputations and potentially a below the knee or above-the-knee amputation.  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 On po abx. Po dilaudid.. 11-24-2022 operative cultures show few pseudomonas and few staph aureus. Pt states he has an important meeting today that he cannot miss. Requesting discharge ASAP.  Will  DC on keflex and cipro. He is f/u with podiatry this Thursday for dressing change. Pt to keep same dressing on his right foot until seen by podiatry this week.  Hyponatremia 11-22-2022 patient on HCTZ at home.  Holding HCTZ.  Check TSH.  Repeat BMP in the morning. 11-23-2022 improved. Hold hydrochlorothiazide at discharge  Hypokalemia 11-23-2022 replete with po kcl  Type 2 diabetes  mellitus with diabetic polyneuropathy (HCC) 11-22-2022 Needs extensive education on insulin therapy, diet.  Patient recently prescribed insulin which she has not started yet.  Will have diabetes educator instruct the patient on continuous glucose monitoring, insulin pen administration, diet.  Patient states that he is committed to taking better care of his diabetes.  Hemoglobin A1c 9.4% indicating poor outpatient control. 11-23-2022 pt needs DM education. A1c 9.4% indication poor outpatient control. Pt states he is determined to get his DM under control. Add 8 unit SQ lantus daily.  11-24-2022 will increase DC lantus dose to 12 units every day. Add SSI to outpatient meds. Awaiting inpatient diabetes educator if possible with his time constraints.  Bipolar II disorder (HCC) 11-22-2022 continue Seroquel 150 mg nightly  Chronic pain disorder 11-22-2022 continue Suboxone 8-2 1 tablet 3 times daily 11-24-2022 have discharged him with small supply of po dilaudid for acute pain due to recent partial ray amputation. Pt will need to f/u with chronic pain clinic for his Suboxone.  Sleep apnea 11-22-2022 stable  GERD 11-22-2022 stable  Essential hypertension 11-22-2022 continue with Norvasc 10 mg daily, losartan 100 mg daily.  Holding HCTZ due to hyponatremia.  Depression 11-22-2022 continue Zoloft 100 mg daily  Mixed hyperlipidemia 11-22-2022 Continue Lipitor 80 mg daily   DVT prophylaxis: SCDs Start: 11/21/22 1748     Code Status: Full Code Family Communication: no family at bedside Disposition Plan: home. Pt states he is going to go live with his brother and sister-in-law at discharge Reason for continuing need for hospitalization: medically stable for DC  Objective: Vitals:   11/23/22 1511 11/23/22 2055 11/24/22 0619 11/24/22 0739  BP: 130/74 137/65 120/67 116/72  Pulse: 60 (!) 59 (!) 47 (!) 56  Resp: 17 18 16 17   Temp: 99.1 F (37.3 C) 99.5 F (37.5 C) 98.7 F (37.1 C) 98.3 F (36.8  C)  TempSrc: Oral Oral Oral Oral  SpO2: 98% 98% 97% 99%  Weight:      Height:        Intake/Output Summary (Last 24 hours) at 11/24/2022 0824 Last data filed at 11/23/2022 0845 Gross per 24 hour  Intake 120 ml  Output --  Net 120 ml   Filed Weights   11/21/22 1325 11/22/22 0500 11/22/22 0956  Weight: 106.1 kg 109.5 kg 109.5 kg    Examination:  Physical Exam Vitals and nursing note reviewed.  Constitutional:      Appearance: He is not toxic-appearing or diaphoretic.  HENT:     Head: Normocephalic and atraumatic.     Nose: Nose normal.  Eyes:     General: No scleral icterus. Pulmonary:     Effort: Pulmonary effort is normal. No respiratory distress.  Abdominal:     General: There is no distension.  Skin:    Comments: Right foot wrapped in bulky dressing  Neurological:     Mental Status: He is alert and oriented to person, place, and time.     Data Reviewed: I have personally reviewed following labs and imaging studies  CBC: Recent Labs  Lab 11/18/22 1613 11/21/22 1333 11/22/22 0452 11/23/22  0522  WBC 7.7 6.8 5.4 6.5  NEUTROABS 5.8 5.2  --  4.3  HGB 13.3 12.1* 12.0* 11.4*  HCT 37.0* 35.5* 34.2* 33.6*  MCV 78.1* 78.7* 79.4* 79.1*  PLT 235 186 168 170   Basic Metabolic Panel: Recent Labs  Lab 11/18/22 1613 11/21/22 1333 11/22/22 0452 11/23/22 0522  NA 129* 130* 131* 134*  K 4.3 3.5 3.6 3.4*  CL 92* 95* 96* 99  CO2 26 23 24 25   GLUCOSE 330* 319* 168* 179*  BUN 18 16 11 13   CREATININE 0.84 1.00 0.83 0.77  CALCIUM 9.9 9.0 8.2* 8.6*   GFR: Estimated Creatinine Clearance: 127.7 mL/min (by C-G formula based on SCr of 0.77 mg/dL). Liver Function Tests: Recent Labs  Lab 11/21/22 1333 11/22/22 0452 11/23/22 0522  AST 28 20 16   ALT 23 16 19   ALKPHOS 60 47 51  BILITOT 0.8 0.7 0.9  PROT 7.0 5.8* 6.0*  ALBUMIN 3.6 3.0* 3.2*   HbA1C: Recent Labs    11/22/22 0452  HGBA1C 9.4*   CBG: Recent Labs  Lab 11/23/22 0730 11/23/22 1108  11/23/22 1533 11/23/22 2057 11/24/22 0738  GLUCAP 173* 213* 254* 303* 172*   Thyroid Function Tests: Recent Labs    11/22/22 1507  TSH 1.749   Sepsis Labs: Recent Labs  Lab 11/21/22 1333  LATICACIDVEN 2.6*    Recent Results (from the past 240 hour(s))  Blood culture (routine x 2)     Status: None (Preliminary result)   Collection Time: 11/21/22  5:26 PM   Specimen: BLOOD RIGHT HAND  Result Value Ref Range Status   Specimen Description BLOOD RIGHT HAND  Final   Special Requests   Final    BOTTLES DRAWN AEROBIC AND ANAEROBIC Blood Culture adequate volume   Culture   Final    NO GROWTH 3 DAYS Performed at [facility], [address] [place], [place] [address]    Report Status PENDING  Incomplete  Blood culture (routine x 2)     Status: None (Preliminary result)   Collection Time: 11/21/22 10:38 PM   Specimen: BLOOD  Result Value Ref Range Status   Specimen Description BLOOD BLOOD RIGHT HAND  Final   Special Requests   Final    BOTTLES DRAWN AEROBIC AND ANAEROBIC Blood Culture adequate volume   Culture   Final    NO GROWTH 3 DAYS Performed at [facility], [address], [place], [place] [address]    Report Status PENDING  Incomplete  Aerobic/Anaerobic Culture w Gram Stain (surgical/deep wound)     Status: None (Preliminary result)   Collection Time: 11/22/22 11:18 AM   Specimen: PATH Bone biopsy; Tissue  Result Value Ref Range Status   Specimen Description TISSUE  Final   Special Requests right fifth ray amputation  Final   Gram Stain FEW WBC SEEN MODERATE GRAM POSITIVE COCCI   Final   Culture   Final    FEW PSEUDOMONAS AERUGINOSA FEW STAPHYLOCOCCUS AUREUS CULTURE REINCUBATED FOR BETTER GROWTH Performed at [facility], [address] [place], [place] [address]    Report Status PENDING  Incomplete     Radiology Studies: DG Foot 2 Views Right  Result Date: 11/23/2022 CLINICAL DATA:  Status post partial ray resection of the fifth  digit. EXAM: RIGHT FOOT - 2 VIEW COMPARISON:  Foot radiograph dated 11/18/2022. FINDINGS: The patient is status post a fifth ray resection at the level of the metatarsal shaft. Otherwise, there is no evidence  of fracture or dislocation. Plantar and posterior calcaneal enthesophytes are noted. Degenerative changes are seen in the dorsal midfoot. Soft tissue swelling around the operative bed. IMPRESSION: Status post fifth ray resection at the level of the metatarsal shaft. Electronically Signed   By: Romona Curls M.D.   On: 11/23/2022 11:34    Scheduled Meds:  amLODipine  10 mg Oral q AM   atorvastatin  80 mg Oral Daily   buprenorphine-naloxone  1 tablet Sublingual TID   cefadroxil  500 mg Oral BID   folic acid  1 mg Oral Daily   insulin aspart  0-15 Units Subcutaneous TID WC   insulin glargine-yfgn  8 Units Subcutaneous Daily   losartan  100 mg Oral Daily   metroNIDAZOLE  500 mg Oral Q12H   QUEtiapine  150 mg Oral QHS   sertraline  100 mg Oral Daily   thiamine  100 mg Oral Daily   Continuous Infusions:   LOS: 0 days   Time spent: 35 minutes  Carollee Herter, DO  [facility]  11/24/2022, 8:24 AM

## 2022-11-24 NOTE — Plan of Care (Signed)

## 2022-11-25 LAB — SURGICAL PATHOLOGY

## 2022-11-26 LAB — CULTURE, BLOOD (ROUTINE X 2)
Culture: NO GROWTH
Culture: NO GROWTH
Special Requests: ADEQUATE
Special Requests: ADEQUATE

## 2022-11-27 ENCOUNTER — Ambulatory Visit: Payer: Medicare Other | Admitting: Podiatry

## 2022-11-28 ENCOUNTER — Ambulatory Visit (INDEPENDENT_AMBULATORY_CARE_PROVIDER_SITE_OTHER): Payer: Medicare Other | Admitting: Podiatry

## 2022-11-28 ENCOUNTER — Ambulatory Visit (INDEPENDENT_AMBULATORY_CARE_PROVIDER_SITE_OTHER): Payer: Medicare Other

## 2022-11-28 DIAGNOSIS — I96 Gangrene, not elsewhere classified: Secondary | ICD-10-CM | POA: Diagnosis not present

## 2022-11-28 LAB — AEROBIC/ANAEROBIC CULTURE W GRAM STAIN (SURGICAL/DEEP WOUND)

## 2022-11-28 MED ORDER — METOCLOPRAMIDE HCL 5 MG PO TABS: 5.0000 mg | ORAL_TABLET | Freq: Four times a day (QID) | ORAL | 0 refills | Status: DC

## 2022-11-28 NOTE — Progress Notes (Signed)
  Subjective:  Patient ID: [name] [name], male    DOB: 1960/06/07,  MRN: [id number]  Chief Complaint  Patient presents with   Routine Post Op    hospital post op/ dos 11/9,  right 5th ray amputation     DOS: 11/22/2022 Procedure: Right foot partial fifth ray amputation  62 y.o. male returns for post-op check. Patient reports he has been doing well.  He has been walking in postop shoe.  He has been doing daily dressing care changing dressing to his right foot.  Applying Silvadene cream to the great toe and Betadine to the outside of the foot.  Denies any drainage.  Denies any issues.  Still taking antibiotics.  Is having some nausea and wants an antiemetic prescribed.  Review of Systems: Negative except as noted in the HPI. Denies N/V/F/Ch.   Objective:  There were no vitals filed for this visit. There is no height or weight on file to calculate BMI. Constitutional Well developed. Well nourished.  Vascular Foot warm and well perfused. Capillary refill normal to all digits.  Calf is soft and supple, no posterior calf or knee pain, negative Homans' sign  Neurologic Normal speech. Oriented to person, place, and time. Epicritic sensation to light touch grossly present bilaterally.  Dermatologic Skin healing well without dehisence though small area of necrosis at the proximal incision. Plantar left hallux ulcer with healthy granular tissue base.    Orthopedic: No tenderness to palpation noted about the surgical site.   Multiple view plain film radiographs: 11/28/22: XR 3 views AP lateral oblique of the right foot.  Weightbearing.  Sharp resection of the fifth metatarsal with amputation of the fifth toe.  No complication noted Assessment:   1. Gangrene of toe of right foot (HCC)    Plan:  Patient was evaluated and treated and all questions answered.  S/p foot surgery right partial fifth ray amputation -Progressing as expected post-operatively.  Healing with mild incisional  necrosis however no dehiscence at this time -XR: As above no complication noted -WB Status: Weightbearing as tolerated in in Darco shoe.  New shoe was provided as the old wound was not fitting patient -Sutures: To remain intact until next appointment -Medications: Sent Rx for Reglan.  Continue antibiotics -Foot redressed.  Continue daily wound care with Betadine wet-to-dry dressing changes  Return in about 1 week (around 12/05/2022).         Corinna Gab, DPM [facility] / [facility]

## 2022-12-05 ENCOUNTER — Ambulatory Visit (INDEPENDENT_AMBULATORY_CARE_PROVIDER_SITE_OTHER): Payer: Medicare Other | Admitting: Podiatry

## 2022-12-05 DIAGNOSIS — I96 Gangrene, not elsewhere classified: Secondary | ICD-10-CM

## 2022-12-05 DIAGNOSIS — E1142 Type 2 diabetes mellitus with diabetic polyneuropathy: Secondary | ICD-10-CM

## 2022-12-05 MED ORDER — MUPIROCIN 2 % EX OINT: 1.0000 | TOPICAL_OINTMENT | Freq: Every day | CUTANEOUS | 0 refills | Status: DC

## 2022-12-05 NOTE — Progress Notes (Signed)
  Subjective:  Patient ID: [name] [name], male    DOB: 1960-11-22,  MRN: [id number]  No chief complaint on file.   DOS: 11/22/2022 Procedure: Right foot partial fifth ray amputation  62 y.o. male returns for post-op check. Patient reports he has been doing well.  He has been walking in postop shoe.  He has been doing daily dressing care changing dressing to his right foot.    Review of Systems: Negative except as noted in the HPI. Denies N/V/F/Ch.   Objective:  There were no vitals filed for this visit. There is no height or weight on file to calculate BMI. Constitutional Well developed. Well nourished.  Vascular Foot warm and well perfused. Capillary refill normal to all digits.  Calf is soft and supple, no posterior calf or knee pain, negative Homans' sign  Neurologic Normal speech. Oriented to person, place, and time. Epicritic sensation to light touch grossly present bilaterally.  Dermatologic Skin healing well without dehisence though small area of necrosis at the proximal incision. Plantar left hallux ulcer with healthy granular tissue base.  Overall toe ulcers are fully improved at this point.  Amputation site continues to heal   Orthopedic: No tenderness to palpation noted about the surgical site.   Multiple view plain film radiographs: Deferred today Assessment:   1. Gangrene of toe of right foot (HCC)   2. DM type 2 with diabetic peripheral neuropathy (HCC)     Plan:  Patient was evaluated and treated and all questions answered.  S/p foot surgery right partial fifth ray amputation -Progressing as expected post-operatively.  Healing with mild incisional necrosis improving from prior -XR: As above no complication noted -WB Status: Weightbearing as tolerated in in Darco shoe.   -Sutures: Removed in total at this visit -Medications: Finish doxycycline that he already has -Foot redressed.  Continue daily wound care with Betadine wet-to-dry dressing changes and then  apply antibiotic mupirocin ointment overlying sent to this pharmacy today  Return in about 2 weeks (around 12/19/2022) for f/u R foot amp site.         Corinna Gab, DPM [facility] / [facility]

## 2022-12-16 ENCOUNTER — Telehealth: Payer: Self-pay | Admitting: Podiatry

## 2022-12-16 ENCOUNTER — Other Ambulatory Visit: Payer: Self-pay | Admitting: Podiatry

## 2022-12-16 MED ORDER — CLINDAMYCIN HCL 300 MG PO CAPS: 300.0000 mg | ORAL_CAPSULE | Freq: Three times a day (TID) | ORAL | 0 refills | Status: AC

## 2022-12-16 NOTE — Progress Notes (Signed)
Rx for abx sent per pt request

## 2022-12-16 NOTE — Telephone Encounter (Signed)
Patient would like refill of antibiotic.

## 2022-12-19 ENCOUNTER — Ambulatory Visit (INDEPENDENT_AMBULATORY_CARE_PROVIDER_SITE_OTHER): Payer: Medicare Other | Admitting: Podiatry

## 2022-12-19 ENCOUNTER — Encounter: Payer: Self-pay | Admitting: Podiatry

## 2022-12-19 VITALS — Ht 74.0 in | Wt 241.0 lb

## 2022-12-19 DIAGNOSIS — Z9889 Other specified postprocedural states: Secondary | ICD-10-CM

## 2022-12-19 DIAGNOSIS — I96 Gangrene, not elsewhere classified: Secondary | ICD-10-CM

## 2022-12-19 MED ORDER — CLINDAMYCIN HCL 300 MG PO CAPS: 300.0000 mg | ORAL_CAPSULE | Freq: Three times a day (TID) | ORAL | 0 refills | Status: DC

## 2022-12-19 MED ORDER — SILVER SULFADIAZINE 1 % EX CREA: 1.0000 | TOPICAL_CREAM | Freq: Every day | CUTANEOUS | 0 refills | Status: DC

## 2022-12-19 NOTE — Progress Notes (Unsigned)
  Subjective:  Patient ID: [name] [name], male    DOB: 07-02-1960,  MRN: [id number]  Chief Complaint  Patient presents with   Routine Post Op    dos 11/9, right 5th ray amputation/ area where amputation was is turning black    DOS: 11/22/2022 Procedure: Right foot partial fifth ray amputation  62 y.o. male returns for post-op check. Patient reports he has been doing well.  He has been walking in postop shoe.  He has been doing daily dressing care changing dressing to his right foot.    Review of Systems: Negative except as noted in the HPI. Denies N/V/F/Ch.   Objective:  There were no vitals filed for this visit. Body mass index is 30.94 kg/m. Constitutional Well developed. Well nourished.  Vascular Foot warm and well perfused. Capillary refill normal to all digits.  Calf is soft and supple, no posterior calf or knee pain, negative Homans' sign  Neurologic Normal speech. Oriented to person, place, and time. Epicritic sensation to light touch grossly present bilaterally.  Dermatologic Granular wound base noted with dehiscence of the skin incision.  No exposure of bone noted no purulent drainage no signs of infection noted   Orthopedic: No tenderness to palpation noted about the surgical site.   Multiple view plain film radiographs: Deferred today Assessment:   No diagnosis found.   Plan:  Patient was evaluated and treated and all questions answered.  S/p foot surgery right partial fifth ray amputation -Progressing as expected post-operatively.  Healing with mild incisional necrosis improving from prior -XR: As above no complication noted -WB Status: Weightbearing as tolerated in in Darco shoe.   -Sutures: Removed in total at this visit -Medications: None -Continue Betadine wet-to-dry dressing.  Overall healthy granular wound base noted. No follow-ups on file.

## 2022-12-23 ENCOUNTER — Encounter: Payer: Self-pay | Admitting: Podiatry

## 2022-12-23 ENCOUNTER — Ambulatory Visit (INDEPENDENT_AMBULATORY_CARE_PROVIDER_SITE_OTHER): Payer: Medicare Other | Admitting: Podiatry

## 2022-12-23 DIAGNOSIS — L97512 Non-pressure chronic ulcer of other part of right foot with fat layer exposed: Secondary | ICD-10-CM

## 2022-12-23 DIAGNOSIS — Z9889 Other specified postprocedural states: Secondary | ICD-10-CM

## 2022-12-23 DIAGNOSIS — I96 Gangrene, not elsewhere classified: Secondary | ICD-10-CM

## 2022-12-23 MED ORDER — METOCLOPRAMIDE HCL 5 MG PO TABS: 5.0000 mg | ORAL_TABLET | Freq: Four times a day (QID) | ORAL | 0 refills | Status: AC

## 2022-12-23 MED ORDER — MUPIROCIN 2 % EX OINT: 1.0000 | TOPICAL_OINTMENT | Freq: Every day | CUTANEOUS | 0 refills | Status: DC

## 2022-12-23 NOTE — Progress Notes (Signed)
  Subjective:  Patient ID: [name] [name], male    DOB: 04-08-1960,  MRN: [id number]  Chief Complaint  Patient presents with   Wound Check    DOS: 11/22/2022 Procedure: Right foot partial fifth ray amputation  62 y.o. male returns for post-op check.  Patient is now 1 month status post above procedure patient reports he has been doing well.  He has been walking in postop shoe.  He has been doing daily dressing care changing dressing to his right foot with Betadine dressings.  He reports overall he has been doing all right denies significant drainage.  Review of Systems: Negative except as noted in the HPI. Denies N/V/F/Ch.   Objective:  There were no vitals filed for this visit. There is no height or weight on file to calculate BMI. Constitutional Well developed. Well nourished.  Vascular Foot warm and well perfused. Capillary refill normal to all digits.  Calf is soft and supple, no posterior calf or knee pain, negative Homans' sign  Neurologic Normal speech. Oriented to person, place, and time. Epicritic sensation to light touch grossly present bilaterally.  Dermatologic At the partial fifth ray amputation site, there is still ulceration present measuring approximately 4.5 x 1 x 0.3 cm.  Healthy granular tissue almost the wound base with some fibronecrotic tissue at the proximal aspect   Orthopedic: No tenderness to palpation noted about the surgical site.   Multiple view plain film radiographs: Deferred today Assessment:   1. Post-operative state   2. Gangrene of toe of right foot (HCC)   3. Ulcer of right foot with fat layer exposed (HCC)      Plan:  Patient was evaluated and treated and all questions answered.  S/p foot surgery right partial fifth ray amputation -Progressing with chronic ulceration/partial dehiscence of the amputation site.  Less concern for residual infection versus more likely microvascular disease causing slow healing -Will plan to proceed with  local wound care.  Daily mupirocin ointment applied to the wound.  Continue antibiotics -Discussed referral to wound care center will place at this appointment -Discussed if no improvement by next appointment in 2 weeks we will consider OR debridement and graft application -XR: Deferred -WB Status: Weightbearing as tolerated in in Darco shoe.   -Sutures: Previously removed -Medications: Previously on doxycycline, continue on clindamycin currently -Foot redressed after debridement at this visit.  Continue daily wound care    Return in about 2 weeks (around 01/06/2023) for POV R foot 5th ray amp.         Corinna Gab, DPM [facility] / [facility]

## 2022-12-24 ENCOUNTER — Ambulatory Visit (HOSPITAL_COMMUNITY)
Admission: RE | Admit: 2022-12-24 | Discharge: 2022-12-24 | Disposition: A | Discharge status: Home / Self Care | Payer: Medicare Other | Source: Ambulatory Visit | Attending: Internal Medicine | Admitting: Internal Medicine

## 2022-12-24 ENCOUNTER — Other Ambulatory Visit (HOSPITAL_COMMUNITY): Payer: Self-pay | Admitting: Internal Medicine

## 2022-12-24 ENCOUNTER — Encounter (HOSPITAL_BASED_OUTPATIENT_CLINIC_OR_DEPARTMENT_OTHER): Payer: Medicare Other | Attending: Internal Medicine | Admitting: Internal Medicine

## 2022-12-24 DIAGNOSIS — X58XXXA Exposure to other specified factors, initial encounter: Secondary | ICD-10-CM | POA: Diagnosis not present

## 2022-12-24 DIAGNOSIS — L97509 Non-pressure chronic ulcer of other part of unspecified foot with unspecified severity: Secondary | ICD-10-CM | POA: Diagnosis present

## 2022-12-24 DIAGNOSIS — E1169 Type 2 diabetes mellitus with other specified complication: Secondary | ICD-10-CM | POA: Diagnosis present

## 2022-12-24 DIAGNOSIS — G4733 Obstructive sleep apnea (adult) (pediatric): Secondary | ICD-10-CM | POA: Diagnosis not present

## 2022-12-24 DIAGNOSIS — I10 Essential (primary) hypertension: Secondary | ICD-10-CM | POA: Insufficient documentation

## 2022-12-24 DIAGNOSIS — E1142 Type 2 diabetes mellitus with diabetic polyneuropathy: Secondary | ICD-10-CM | POA: Diagnosis not present

## 2022-12-24 DIAGNOSIS — E11621 Type 2 diabetes mellitus with foot ulcer: Secondary | ICD-10-CM

## 2022-12-24 DIAGNOSIS — M869 Osteomyelitis, unspecified: Secondary | ICD-10-CM | POA: Insufficient documentation

## 2022-12-24 DIAGNOSIS — T8131XA Disruption of external operation (surgical) wound, not elsewhere classified, initial encounter: Secondary | ICD-10-CM | POA: Diagnosis not present

## 2022-12-24 DIAGNOSIS — L97514 Non-pressure chronic ulcer of other part of right foot with necrosis of bone: Secondary | ICD-10-CM | POA: Diagnosis not present

## 2022-12-24 DIAGNOSIS — K219 Gastro-esophageal reflux disease without esophagitis: Secondary | ICD-10-CM | POA: Diagnosis not present

## 2022-12-24 DIAGNOSIS — L97511 Non-pressure chronic ulcer of other part of right foot limited to breakdown of skin: Secondary | ICD-10-CM | POA: Diagnosis not present

## 2022-12-25 NOTE — Progress Notes (Signed)
[name], [name] ([id number]) [id number] Page 1 of 13 Visit Report for 12/24/2022 Allergy List Details Patient Name: Date of Service: [name], [name] 12/24/2022 9:30 A M Medical Record Number: [id number] Patient Account Number: [id number] Date of Birth/Sex: Treating RN: 1960/09/16 (62 y.o. [name] [name] Primary Care [facility]: [name] [name] DDHA Other Clinician: Referring [name] [name]: Treating [name] [name]/Extender: [name] [name] in Treatment: 0 Allergies Active Allergies heparin oxytetracycline erythromycin base Sulfa (Sulfonamide Antibiotics) Allergy Notes Electronic Signature(s) Signed: 12/24/2022 5:45:54 PM By: [name] Stall RN, BSN Entered By: [name] [name] on 12/24/2022 08:56:09 -------------------------------------------------------------------------------- Arrival Information Details Patient Name: Date of Service: [name], [name] 12/24/2022 9:30 A M Medical Record Number: [id number] Patient Account Number: [id number] Date of Birth/Sex: Treating RN: 02/23/1960 (62 y.o. [name] [name] Primary Care [name] [name]: [name] [name] DDHA Other Clinician: Referring [name] [name]: Treating [name] [name]/Extender: [name] [name] in Treatment: 0 Visit Information Patient Arrived: Ambulatory Arrival Time: 09:56 Accompanied By: self Transfer Assistance: None Patient Identification Verified: Yes Secondary Verification Process Completed: Yes Patient Requires Transmission-Based Precautions: No Patient Has Alerts: No Electronic Signature(s) Signed: 12/24/2022 5:45:54 PM By: [name] Stall RN, BSN Entered By: [name] [name] on 12/24/2022 09:56:58 [name] [name] ([id number]) [id number] Page 2 of 13 -------------------------------------------------------------------------------- Clinic Level of Care Assessment Details Patient Name: Date of Service: SMA  [name] [name], [name] 12/24/2022 9:30 A M Medical Record Number: [id number] Patient Account Number: [id number] Date of Birth/Sex: Treating RN: Jun 09, 1960 (62 y.o. [name] [name] Primary Care [name] [name]: [name] [name] DDHA Other Clinician: Referring [name] [name]: Treating [name] [name]/Extender: [name] [name] in Treatment: 0 Clinic Level of Care Assessment Items TOOL 1 Quantity Score X- 1 0 Use when EandM and Procedure is performed on INITIAL visit ASSESSMENTS - Nursing Assessment / Reassessment X- 1 20 General Physical Exam (combine w/ comprehensive assessment (listed just below) when performed on new pt. evals) X- 1 25 Comprehensive Assessment (HX, ROS, Risk Assessments, Wounds Hx, etc.) ASSESSMENTS - Wound and Skin Assessment / Reassessment X- 1 10 Dermatologic / Skin Assessment (not related to wound area) ASSESSMENTS - Ostomy and/or Continence Assessment and Care []  - 0 Incontinence Assessment and Management []  - 0 Ostomy Care Assessment and Management (repouching, etc.) PROCESS - Coordination of Care []  - 0 Simple Patient / Family Education for ongoing care X- 1 20 Complex (extensive) Patient / Family Education for ongoing care X- 1 10 Staff obtains Chiropractor, Records, T Results / Process Orders est []  - 0 Staff telephones HHA, Nursing Homes / Clarify orders / etc []  - 0 Routine Transfer to another Facility (non-emergent condition) []  - 0 Routine Hospital Admission (non-emergent condition) X- 1 15 New Admissions / Manufacturing engineer / Ordering NPWT Apligraf, etc. , []  - 0 Emergency Hospital Admission (emergent condition) PROCESS - Special Needs []  - 0 Pediatric / Minor Patient Management []  - 0 Isolation Patient Management []  - 0 Hearing / Language / Visual special needs []  - 0 Assessment of Community assistance (transportation, [name]/C planning, etc.) []  - 0 Additional assistance / Altered mentation []  - 0 Support Surface(s)  Assessment (bed, cushion, seat, etc.) INTERVENTIONS - Miscellaneous []  - 0 External ear exam []  - 0 Patient Transfer (multiple staff / Nurse, adult / Similar devices) []  - 0 Simple Staple / Suture removal (25 or less) []  - 0 Complex Staple / Suture removal (26 or more) []  - 0 Hypo/Hyperglycemic Management (  do not check if billed separately) X- 1 15 Ankle / Brachial Index (ABI) - do not check if billed separately Has the patient been seen at the hospital within the last three years: Yes Total Score: 115 Level Of Care: New/Established - Level 3 Electronic Signature(s) Signed: 12/24/2022 5:45:54 PM By: [name] Stall RN, BSN [name] [name], [name] [name] ([id number]) [name] Stall RN, BSN [phone].pdf Page 3 of 13 Signed: 12/24/2022 5:45:54 PM By: Margaret [name] By: [name] [name] on 12/24/2022 10:38:41 -------------------------------------------------------------------------------- Compression Therapy Details Patient Name: Date of Service: [name], [name] 12/24/2022 9:30 A M Medical Record Number: [id number] Patient Account Number: [id number] Date of Birth/Sex: Treating RN: 01/12/1961 (62 y.o. [name] [name] Primary Care [name] [name]: [name] [name] DDHA Other Clinician: Referring [name] [name]: Treating [name] [name]/Extender: [name] [name] in Treatment: 0 Compression Therapy Performed for Wound Assessment: Wound #1 Right T Great oe Performed By: Clinician [name] Stall, RN Compression Type: Double Layer Post Procedure Diagnosis Same as Pre-procedure Electronic Signature(s) Signed: 12/24/2022 5:45:54 PM By: [name] Stall RN, BSN Entered By: [name] [name] on 12/24/2022 10:36:17 -------------------------------------------------------------------------------- Encounter Discharge Information Details Patient Name: Date of Service: [name], [name] 12/24/2022 9:30 A M Medical Record Number: [id number] Patient Account Number:  [id number] Date of Birth/Sex: Treating RN: Nov 19, 1960 (62 y.o. [name] [name] Primary Care Shante Maysonet: [name] [name] DDHA Other Clinician: Referring [name] [name]: Treating [name] [name]/Extender: [name] [name] in Treatment: 0 Encounter Discharge Information Items Post Procedure Vitals Discharge Condition: Stable Temperature (F): 97.9 Ambulatory Status: Ambulatory Pulse (bpm): 59 Discharge Destination: Home Respiratory Rate (breaths/min): 18 Transportation: Private Auto Blood Pressure (mmHg): 119/69 Accompanied By: self Schedule Follow-up Appointment: Yes Clinical Summary of Care: Electronic Signature(s) Signed: 12/24/2022 5:45:54 PM By: [name] Stall RN, BSN Entered By: [name] [name] on 12/24/2022 10:40:00 -------------------------------------------------------------------------------- Lower Extremity Assessment Details Patient Name: Date of Service: [name], [name] 12/24/2022 9:30 A M Medical Record Number: [id number] Patient Account Number: [id number] Date of Birth/Sex: Treating RN: 20-Oct-1960 (62 y.o. [name] [name] Primary Care Deronte Solis: [name] [name] DDHA Other Clinician: MALI, [name] [name] ([id number]) [id number] Page 4 of 13 Referring Lux Skilton: Treating Oneka Parada/Extender: [name] [name] in Treatment: 0 Edema Assessment Assessed: [Left: No] [Right: Yes] Edema: [Left: Ye] [Right: s] Calf Left: Right: Point of Measurement: 35 cm From Medial Instep 44 cm Ankle Left: Right: Point of Measurement: 11 cm From Medial Instep 29 cm Knee To Floor Left: Right: From Medial Instep 48 cm Vascular Assessment Pulses: Dorsalis Pedis Palpable: [Right:Yes] Doppler Audible: [Right:Yes] Posterior Tibial Palpable: [Right:Yes] Doppler Audible: [Right:Yes] Extremity colors, hair growth, and conditions: Extremity Color: [Right:Hyperpigmented] Hair Growth on Extremity:  [Right:No] Temperature of Extremity: [Right:Warm] Capillary Refill: [Right:< 3 seconds] Dependent Rubor: [Right:No] Blanched when Elevated: [Right:No] 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 12/24/2022 9:30 A M Medical Record Number: [id number] Patient Account Number: [id number] Date of Birth/Sex: Treating RN: [birth year] (62 y.o. M) Primary Care Shondale Quinley: [name] [name] DDHA Other Clinician: Referring Almer Littleton: Treating Emelia Sandoval/Extender: [name] [name] in Treatment: 0 Vital Signs Height(in): 74 Pulse(bpm): 59 Weight(lbs):  236 Blood Pressure(mmHg): 119/69 [name] [name], [name] [name] ([id number]) [phone].pdf Page 5 of 13 Body Mass Index(BMI): 30.3 Temperature(F): 97.9 Respiratory Rate(breaths/min): 18 [1:Photos:] [N/A:N/A] Right T Great oe Right, Distal, Lateral Amputation Site - N/A Wound Location: Toe Thermal Burn Thermal Burn N/A Wounding Event: Diabetic Wound/Ulcer of the Lower Diabetic Wound/Ulcer of the Lower N/A Primary Etiology: Extremity Extremity N/A Open Surgical Wound N/A Secondary Etiology: Cataracts, Sleep Apnea, Hypertension, Cataracts, Sleep Apnea, Hypertension, N/A Comorbid History: Type II Diabetes, Osteoarthritis, Type II Diabetes, Osteoarthritis, Neuropathy Neuropathy 10/22/2022 11/22/2022 N/A Date Acquired: 0 0 N/A Weeks of Treatment: Open Open N/A Wound Status: No No N/A Wound Recurrence: 0.9x0.6x0.1 4.9x1.3x0.7  N/A Measurements L x W x [name] (cm) 0.424 5.003 N/A A (cm) : rea 0.042 3.502 N/A Volume (cm) : Grade 1 Grade 2 N/A Classification: Medium Medium N/A Exudate A mount: Serosanguineous Serosanguineous N/A Exudate Type: red, brown red, brown N/A Exudate Color: Distinct, outline attached Distinct, outline attached N/A Wound Margin: None Present (0%) Medium (34-66%) N/A Granulation A mount: N/A Red, Pink N/A Granulation Quality: Large (67-100%) Medium (34-66%) N/A Necrotic A mount: Eschar, Adherent Slough Adherent Slough N/A Necrotic Tissue: Fat Layer (Subcutaneous Tissue): Yes Fat Layer (Subcutaneous Tissue): Yes N/A Exposed Structures: Fascia: No Bone: Yes Tendon: No Fascia: No Muscle: No Tendon: No Joint: No Muscle: No Bone: No Joint: No Small (1-33%) Small (1-33%) N/A Epithelialization: Debridement - Excisional Debridement - Excisional N/A Debridement: Pre-procedure Verification/Time Out 10:11 10:11 N/A Taken: Lidocaine 5% topical ointment Lidocaine 5% topical ointment N/A Pain Control: Callus, Subcutaneous, Slough Subcutaneous, Slough N/A Tissue Debrided: Skin/Subcutaneous Tissue Skin/Subcutaneous Tissue N/A Level: 0.42 5 N/A Debridement A (sq cm): rea Curette Curette N/A Instrument: Minimum Minimum N/A Bleeding: Pressure Pressure N/A Hemostasis A chieved: 0 0 N/A Procedural Pain: 0 0 N/A Post Procedural Pain: Procedure was tolerated well Procedure was tolerated well N/A Debridement Treatment Response: 0.9x0.6x0.1 4.9x1.3x0.7 N/A Post Debridement Measurements L x W x [name] (cm) 0.042 3.502 N/A Post Debridement Volume: (cm) Callus: Yes Excoriation: No N/A Periwound Skin Texture: Excoriation: No Induration: No Induration: No Callus: No Crepitus: No Crepitus: No Rash: No Rash: No Scarring: No Scarring: No Dry/Scaly: Yes Maceration: No N/A Periwound Skin Moisture: Maceration: No Dry/Scaly: No Atrophie Blanche: No Erythema: Yes N/A Periwound  Skin Color: Cyanosis: No Atrophie Blanche: No Ecchymosis: No Cyanosis: No Erythema: No Ecchymosis: No Hemosiderin Staining: No Hemosiderin Staining: No Mottled: No Mottled: No Pallor: No Pallor: No Rubor: No Rubor: No N/A Circumferential N/A Erythema Location: N/A No Abnormality N/A Temperature: Compression Therapy Debridement N/A Procedures Performed: Debridement [name] [name], [name] [name] ([id number]) [phone].pdf Page 6 of 13 Treatment Notes Wound #1 (Toe Great) Wound Laterality: Right Cleanser Vashe 5.8 (oz) Discharge Instruction: Cleanse the wound with Vashe prior to applying a clean dressing using gauze sponges, not tissue or cotton balls. Byram Ancillary Kit - 15 Day Supply Discharge Instruction: Use supplies as instructed; Kit contains: (15) Saline Bullets; (15) 3x3 Gauze; 15 pr Gloves Peri-Wound Care Topical Primary Dressing Maxorb Extra Ag+ Alginate Dressing, 2x2 (in/in) Discharge Instruction: Apply to wound bed as instructed Secondary Dressing Optifoam Non-Adhesive Dressing, 4x4 in Discharge Instruction: Apply foam donut Woven Gauze Sponges 2x2 in Discharge Instruction: Apply over primary dressing as directed. Secured With Conforming Stretch Gauze Bandage, Sterile 2x75 (in/in) Discharge Instruction: Secure with stretch gauze as directed. 61M Medipore H Soft Cloth Surgical T ape, 4 x 10 (in/yd) Discharge Instruction: Secure with tape as directed. Compression Wrap Compression Stockings Add-Ons Wound #2 (Amputation Site - Toe) Wound Laterality: Right,  Lateral, Distal Cleanser Soap and Water Discharge Instruction: May shower and wash wound with dial antibacterial soap and water prior to dressing change. Vashe 5.8 (oz) Discharge Instruction: Cleanse the wound with Vashe prior to applying a clean dressing using gauze sponges, not tissue or cotton balls. Peri-Wound Care Sween Lotion (Moisturizing lotion) Discharge Instruction: Apply  moisturizing lotion as directed Topical Primary Dressing Maxorb Extra Ag+ Alginate Dressing, 4x4.75 (in/in) Discharge Instruction: Apply to wound bed as instructed Secondary Dressing ABD Pad, 8x10 Discharge Instruction: Apply over primary dressing as directed. Secured With Compression Wrap Urgo K2 Lite, (equivalent to a 3 layer) two layer compression system, regular Discharge Instruction: Apply Urgo K2 Lite as directed (alternative to 3 layer compression). Compression Stockings Add-Ons Electronic Signature(s) Signed: 12/24/2022 5:18:28 PM By: Baltazar Najjar MD Entered By: Baltazar Najjar on 12/24/2022 10:54:59 [name] [name] ([id number]) [id number] Page 7 of 13 -------------------------------------------------------------------------------- Multi-Disciplinary Care Plan Details Patient Name: Date of Service: [name], [name] 12/24/2022 9:30 A M Medical Record Number: [id number] Patient Account Number: [id number] Date of Birth/Sex: Treating RN: 1960-02-15 (62 y.o. [name] [name] Primary Care Derrion Tritz: [name] [name] DDHA Other Clinician: Referring Dempsey Ahonen: Treating Sherill Wegener/Extender: [name] [name] in Treatment: 0 Active Inactive Necrotic Tissue Nursing Diagnoses: Impaired tissue integrity related to necrotic/devitalized tissue Goals: Necrotic/devitalized tissue will be minimized in the wound bed Date Initiated: 12/24/2022 Target Resolution Date: 01/16/2023 Goal Status: Active Patient/caregiver will verbalize understanding of reason and process for debridement of necrotic tissue Date Initiated: 12/24/2022 Target Resolution Date: 01/17/2023 Goal Status: Active Interventions: Assess patient pain level pre-, during and post procedure and prior to discharge Treatment Activities: Apply topical anesthetic as ordered : 12/24/2022 Notes: Nutrition Nursing Diagnoses: Impaired glucose control: actual or  potential Goals: Patient/caregiver agrees to and verbalizes understanding of need to obtain nutritional consultation Date Initiated: 12/24/2022 Target Resolution Date: 01/16/2023 Goal Status: Active Patient/caregiver will maintain therapeutic glucose control Date Initiated: 12/24/2022 Target Resolution Date: 01/16/2023 Goal Status: Active Interventions: Assess HgA1c results as ordered upon admission and as needed Provide education on elevated blood sugars and impact on wound healing Provide education on nutrition Treatment Activities: Obtain HgA1c : 12/24/2022 Patient referred to Primary Care Physician for further nutritional evaluation : 12/24/2022 Notes: Orientation to the Wound Care Program Nursing Diagnoses: Knowledge deficit related to the wound healing center program Goals: Patient/caregiver will verbalize understanding of the Wound Healing Center Program Date Initiated: 12/24/2022 Target Resolution Date: 01/16/2023 Goal Status: Active Interventions: Provide education on orientation to the wound center [name] [name], [name] [name] ([id number]) [phone].pdf Page 8 of 13 Notes: Wound/Skin Impairment Nursing Diagnoses: Knowledge deficit related to ulceration/compromised skin integrity Goals: Ulcer/skin breakdown will heal within 14 weeks Date Initiated: 12/24/2022 Target Resolution Date: 04/17/2023 Goal Status: Active Interventions: Assess patient/caregiver ability to perform ulcer/skin care regimen upon admission and as needed Assess ulceration(s) every visit Provide education on ulcer and skin care Treatment Activities: Skin care regimen initiated : 12/24/2022 Topical wound management initiated : 12/24/2022 Notes: Electronic Signature(s) Signed: 12/24/2022 5:45:54 PM By: [name] Stall RN, BSN Entered By: [name] [name] on 12/24/2022 10:21:00 -------------------------------------------------------------------------------- Pain Assessment Details Patient  Name: Date of Service: [name], [name] 12/24/2022 9:30 A M Medical Record Number: [id number] Patient Account Number: [id number] Date of Birth/Sex: Treating RN: 07-02-1960 (62 y.o. [name] [name] Primary Care Wyman Meschke: [name] [name] DDHA Other Clinician: Referring Rosena Bartle: Treating Yarnell Kozloski/Extender: [name] [name] in Treatment: 0 Active Problems Location  of Pain Severity and Description of Pain Patient Has Paino No Site Locations Pain Management and Medication Current Pain Management: Electronic Signature(s) Signed: 12/24/2022 5:45:54 PM By: [name] Stall RN, BSN Entered By: [name] [name] on 12/24/2022 10:08:18 [name] [name] ([id number]) [id number] Page 9 of 13 -------------------------------------------------------------------------------- Patient/Caregiver Education Details Patient Name: Date of Service: [name], [name] 12/11/2024andnbsp9:30 A M Medical Record Number: [id number] Patient Account Number: [id number] Date of Birth/Gender: Treating RN: [birth year] (62 y.o. [name] [name] Primary Care Physician: [name] [name] DDHA Other Clinician: Referring Physician: Treating Physician/Extender: [name] [name] in Treatment: 0 Education Assessment Education Provided To: Patient Education Topics Provided Welcome T [facility] Packet: o Handouts: [facility] Pledge form, Welcome T [facility] o Methods: Explain/Verbal, Printed Responses: Reinforcements needed Electronic Signature(s) Signed: 12/24/2022 5:45:54 PM By: [name] Stall RN, BSN Entered By: [name] [name] on 12/24/2022 10:21:20 -------------------------------------------------------------------------------- Wound Assessment Details Patient Name: Date of Service: [name], [name] 12/24/2022 9:30 A M Medical Record Number: [id number] Patient Account  Number: [id number] Date of Birth/Sex: Treating RN: [birth year] (62 y.o. [name] [name] Primary Care Luz Mares: [name] [name] DDHA Other Clinician: Referring Tunya Held: Treating Loyalty Brashier/Extender: [name] [name] in Treatment: 0 Wound Status Wound Number: 1 Primary Diabetic Wound/Ulcer of the Lower Extremity Etiology: Wound Location: Right T Great oe Wound Open Wounding Event: Thermal Burn Status: Date Acquired: 10/22/2022 Comorbid Cataracts, Sleep Apnea, Hypertension, Type II Diabetes, Weeks Of Treatment: 0 History: Osteoarthritis, Neuropathy Clustered Wound: No Photos Wound Measurements [name] [name], [name] [name] ([id number]) Length: (cm) 0.9 Width: (cm) 0.6 Depth: (cm) 0.1 Area: (cm) 0.424 Volume: (cm) 0.042 [id number] Page 10 of 13 % Reduction in Area: % Reduction in Volume: Epithelialization: Small (1-33%) Tunneling: No Undermining: No Wound Description Classification: Grade 1 Wound Margin: Distinct, outline attached Exudate Amount: Medium Exudate Type: Serosanguineous Exudate Color: red, brown Foul Odor After Cleansing: No Slough/Fibrino Yes Wound Bed Granulation Amount: None Present (0%) Exposed Structure Necrotic Amount: Large (67-100%) Fascia Exposed: No Necrotic Quality: Eschar, Adherent Slough Fat Layer (Subcutaneous Tissue) Exposed: Yes Tendon Exposed: No Muscle Exposed: No Joint Exposed: No Bone Exposed: No Periwound Skin Texture Texture Color No Abnormalities Noted: No No Abnormalities Noted: No Callus: Yes Atrophie Blanche: No Crepitus: No Cyanosis: No Excoriation: No Ecchymosis: No Induration: No Erythema: No Rash: No Hemosiderin Staining: No Scarring: No Mottled: No Pallor: No Moisture Rubor: No No Abnormalities Noted: No Dry / Scaly: Yes Maceration: No Treatment Notes Wound #1 (Toe Great) Wound Laterality: Right Cleanser Vashe 5.8 (oz) Discharge Instruction: Cleanse the  wound with Vashe prior to applying a clean dressing using gauze sponges, not tissue or cotton balls. Byram Ancillary Kit - 15 Day Supply Discharge Instruction: Use supplies as instructed; Kit contains: (15) Saline Bullets; (15) 3x3 Gauze; 15 pr Gloves Peri-Wound Care Topical Primary Dressing Maxorb Extra Ag+ Alginate Dressing, 2x2 (in/in) Discharge Instruction: Apply to wound bed as instructed Secondary Dressing Optifoam Non-Adhesive Dressing, 4x4 in Discharge Instruction: Apply foam donut Woven Gauze Sponges 2x2 in Discharge Instruction: Apply over primary dressing as directed. Secured With Conforming Stretch Gauze Bandage, Sterile 2x75 (in/in) Discharge Instruction: Secure with stretch gauze as directed. 7M Medipore H Soft Cloth Surgical T ape, 4 x 10 (in/yd) Discharge Instruction: Secure with tape as directed. Compression Wrap Compression Stockings Add-Ons Electronic Signature(s) [name] [name], [name] [name] ([id number]) [id number] Page 11 of 13 Signed: 12/24/2022 5:45:54 PM By: [name] Stall  RN, BSN Entered By: [name] [name] on 12/24/2022 10:08:49 -------------------------------------------------------------------------------- Wound Assessment Details Patient Name: Date of Service: [name], [name] 12/24/2022 9:30 A M Medical Record Number: [id number] Patient Account Number: [id number] Date of Birth/Sex: Treating RN: [birth year] (62 y.o. [name] [name] Primary Care Jericha Bryden: [name] [name] DDHA Other Clinician: Referring Keshara Kiger: Treating Katie Moch/Extender: [name] [name] in Treatment: 0 Wound Status Wound Number: 2 Primary Diabetic Wound/Ulcer of the Lower Extremity Etiology: Wound Location: Right, Distal, Lateral Amputation Site - Toe Secondary Open Surgical Wound Wounding Event: Thermal Burn Etiology: Date Acquired: 11/22/2022 Wound Status: Open Weeks Of Treatment: 0 Comorbid Cataracts, Sleep Apnea,  Hypertension, Type II Diabetes, Clustered Wound: No History: Osteoarthritis, Neuropathy Photos Wound Measurements Length: (cm) 4.9 Width: (cm) 1.3 Depth: (cm) 0.7 Area: (cm) 5.003 Volume: (cm) 3.502 % Reduction in Area: % Reduction in Volume: Epithelialization: Small (1-33%) Tunneling: No Undermining: No Wound Description Classification: Grade 2 Wound Margin: Distinct, outline attached Exudate Amount: Medium Exudate Type: Serosanguineous Exudate Color: red, brown Foul Odor After Cleansing: No Slough/Fibrino Yes Wound Bed Granulation Amount: Medium (34-66%) Exposed Structure Granulation Quality: Red, Pink Fascia Exposed: No Necrotic Amount: Medium (34-66%) Fat Layer (Subcutaneous Tissue) Exposed: Yes Necrotic Quality: Adherent Slough Tendon Exposed: No Muscle Exposed: No Joint Exposed: No Bone Exposed: Yes Periwound Skin Texture Texture Color No Abnormalities Noted: No No Abnormalities Noted: No Callus: No Atrophie Blanche: No Crepitus: No Cyanosis: No Excoriation: No Ecchymosis: No Induration: No Erythema: Yes Rash: No Erythema Location: Circumferential CORINTHIAN, FAYNE ([id number]) [id number] Page 12 of 13 Scarring: No Hemosiderin Staining: No Mottled: No Moisture Pallor: No No Abnormalities Noted: No Rubor: No Dry / Scaly: No Maceration: No Temperature / Pain Temperature: No Abnormality Treatment Notes Wound #2 (Amputation Site - Toe) Wound Laterality: Right, Lateral, Distal Cleanser Soap and Water Discharge Instruction: May shower and wash wound with dial antibacterial soap and water prior to dressing change. Vashe 5.8 (oz) Discharge Instruction: Cleanse the wound with Vashe prior to applying a clean dressing using gauze sponges, not tissue or cotton balls. Peri-Wound Care Sween Lotion (Moisturizing lotion) Discharge Instruction: Apply moisturizing lotion as directed Topical Primary Dressing Maxorb Extra Ag+  Alginate Dressing, 4x4.75 (in/in) Discharge Instruction: Apply to wound bed as instructed Secondary Dressing ABD Pad, 8x10 Discharge Instruction: Apply over primary dressing as directed. Secured With Compression Wrap Urgo K2 Lite, (equivalent to a 3 layer) two layer compression system, regular Discharge Instruction: Apply Urgo K2 Lite as directed (alternative to 3 layer compression). Compression Stockings Add-Ons Electronic Signature(s) Signed: 12/24/2022 5:45:54 PM By: [name] Stall RN, BSN Entered By: [name] [name] on 12/24/2022 10:09:15 -------------------------------------------------------------------------------- Vitals Details Patient Name: Date of Service: [name], [name] 12/24/2022 9:30 A M Medical Record Number: [id number] Patient Account Number: [id number] Date of Birth/Sex: Treating RN: 09/07/1960 (62 y.o. [name] [name], Millard.Loa Primary Care Cleo Santucci: [name] [name] DDHA Other Clinician: Referring Benecio Kluger: Treating Breanah Faddis/Extender: [name] [name] in Treatment: 0 Vital Signs Time Taken: 09:57 Temperature (F): 97.9 Height (in): 74 Pulse (bpm): 59 Source: Stated Respiratory Rate (breaths/min): 18 Weight (lbs): 236 Blood Pressure (mmHg): 119/69 Source: Stated Reference Range: 80 - 120 mg / dl Body Mass Index (BMI): 30.3 Electronic Signature(s) Signed: 12/24/2022 5:45:54 PM By: [name] Stall RN, BSN Entered By: [name] [name] on 12/24/2022 09:58:46 [name] [name] ([id number]) [id number] Page 13 of 13

## 2022-12-25 NOTE — Progress Notes (Signed)
BROOKER, DAHMEN ([id number]) [id number] Page 1 of 4 Visit Report for 12/24/2022 Abuse Risk Screen Details Patient Name: Date of Service: [name], [name] 12/24/2022 9:30 A M Medical Record Number: [id number] Patient Account Number: [id number] Date of Birth/Sex: Treating RN: 07-21-1960 (62 y.o. [name] [name] Primary Care [name] [name]: [name] [name] DDHA Other Clinician: Referring [name] [name]: Treating [name] [name]/Extender: [name] [name] in Treatment: 0 Abuse Risk Screen Items Answer ABUSE RISK SCREEN: Has anyone close to you tried to hurt or harm you recentlyo No Do you feel uncomfortable with anyone in your familyo No Has anyone forced you do things that you didnt want to doo No Electronic Signature(s) Signed: 12/24/2022 5:45:54 PM By: [name] Stall RN, BSN Entered By: [name] [name] on 12/24/2022 09:57:48 -------------------------------------------------------------------------------- Activities of Daily Living Details Patient Name: Date of Service: [name], [name] 12/24/2022 9:30 A M Medical Record Number: [id number] Patient Account Number: [id number] Date of Birth/Sex: Treating RN: 10/07/1960 (62 y.o. [name] [name] Primary Care [name] [name]: [name] [name] DDHA Other Clinician: Referring [name] [name]: Treating [name] [name]/Extender: [name] [name] in Treatment: 0 Activities of Daily Living Items Answer Activities of Daily Living (Please select one for each item) Drive Automobile Completely Able T Medications ake Completely Able Use T elephone Completely Able Care for Appearance Completely Able Use T oilet Completely Able Bath / Shower Completely Able Dress Self Completely Able Feed Self Completely Able Walk Completely Able Get In / Out Bed Completely Able Housework Completely Able Prepare Meals Completely Able Handle Money Completely Able Shop for Self  Completely Able Electronic Signature(s) Signed: 12/24/2022 5:45:54 PM By: [name] Stall RN, BSN Entered By: [name] [name] on 12/24/2022 09:58:02 [name] [name] ([id number]) [id number] Page 2 of 4 -------------------------------------------------------------------------------- Education Screening Details Patient Name: Date of Service: [name], [name] 12/24/2022 9:30 A M Medical Record Number: [id number] Patient Account Number: [id number] Date of Birth/Sex: Treating RN: 11-Oct-1960 (62 y.o. [name] [name] Primary Care [name] [name]: [name] [name] DDHA Other Clinician: Referring [name] [name]: Treating [name] [name]/Extender: [name] [name] in Treatment: 0 Primary Learner Assessed: Patient Learning Preferences/Education Level/Primary Language Learning Preference: Explanation, Demonstration, Printed Material Highest Education Level: College or Above Preferred Language: English Cognitive Barrier Language Barrier: No Translator Needed: No Memory Deficit: No Emotional Barrier: No Cultural/Religious Beliefs Affecting Medical Care: No Physical Barrier Impaired Vision: No Impaired Hearing: No Decreased Hand dexterity: No Knowledge/Comprehension Knowledge Level: High Comprehension Level: High Ability to understand written instructions: High Ability to understand verbal instructions: High Motivation Anxiety Level: Calm Cooperation: Cooperative Education Importance: Acknowledges Need Interest in Health Problems: Asks Questions Perception: Coherent Willingness to Engage in Self-Management High Activities: Readiness to Engage in Self-Management High Activities: Electronic Signature(s) Signed: 12/24/2022 5:45:54 PM By: [name] Stall RN, BSN Entered By: [name] [name] on 12/24/2022 09:59:29 -------------------------------------------------------------------------------- Fall Risk Assessment Details Patient Name:  Date of Service: [name], [name] 12/24/2022 9:30 A M Medical Record Number: [id number] Patient Account Number: [id number] Date of Birth/Sex: Treating RN: 05/02/1960 (62 y.o. [name] [name] Primary Care [name] [name]: [name] [name] DDHA Other Clinician: Referring [name] [name]: Treating [name] [name]/Extender: [name] [name] in Treatment: 0 Fall Risk Assessment Items Have you had 2 or more falls in the last [address] [name] [name] ([id number]) [phone] [id number] Page 3 of 4 Have you had any fall that resulted in  injury in the last 12 monthso 0 No FALLS RISK SCREEN History of falling - immediate or within 3 months 25 Yes Secondary diagnosis (Do you have 2 or more medical diagnoseso) 0 No Ambulatory aid None/bed rest/wheelchair/nurse 0 Yes Crutches/cane/walker 0 No Furniture 0 No Intravenous therapy Access/Saline/Heparin Lock 0 No Gait/Transferring Normal/ bed rest/ wheelchair 0 Yes Weak (short steps with or without shuffle, stooped but able to lift head while walking, may seek 0 No support from furniture) Impaired (short steps with shuffle, may have difficulty arising from chair, head down, impaired 0 No balance) Mental Status Oriented to own ability 0 Yes Electronic Signature(s) Signed: 12/24/2022 5:45:54 PM By: [name] Stall RN, BSN Entered By: [name] [name] on 12/24/2022 09:59:39 -------------------------------------------------------------------------------- Foot Assessment Details Patient Name: Date of Service: [name], [name] 12/24/2022 9:30 A M Medical Record Number: [id number] Patient Account Number: [id number] Date of Birth/Sex: Treating RN: 1960-06-09 (62 y.o. [name] [name] Primary Care [name] [name]: [name] [name] DDHA Other Clinician: Referring [name] [name]: Treating [name] [name]/Extender: [name] [name] in Treatment: 0 Foot Assessment Items Site Locations + =  Sensation present, - = Sensation absent, C = Callus, U = Ulcer R = Redness, W = Warmth, M = Maceration, PU = Pre-ulcerative lesion F = Fissure, S = Swelling, [name] = Dryness Assessment Right: Left: Other Deformity: No No Prior Foot Ulcer: No No Prior Amputation: Yes No Charcot Joint: No No Ambulatory Status: Ambulatory Without Help GaitTRAFTON, [name] ([id number]) [phone] [id number] Page 4 of 4 Electronic Signature(s) Signed: 12/24/2022 5:45:54 PM By: [name] Stall RN, BSN Entered By: [name] [name] on 12/24/2022 10:03:28 -------------------------------------------------------------------------------- Nutrition Risk Screening Details Patient Name: Date of Service: SMA LLWO Val Eagle [name], [name] 12/24/2022 9:30 A M Medical Record Number: [id number] Patient Account Number: [id number] Date of Birth/Sex: Treating RN: 02-Jan-1961 (62 y.o. [name] [name] Primary Care Lavance Beazer: [name] [name] DDHA Other Clinician: Referring Alverda Nazzaro: Treating Ramone Gander/Extender: [name] [name] in Treatment: 0 Height (in): 74 Weight (lbs): 236 Body Mass Index (BMI): 30.3 Nutrition Risk Screening Items Score Screening NUTRITION RISK SCREEN: I have an illness or condition that made me change the kind and/or amount of food I eat 2 Yes I eat fewer than two meals per day 0 No I eat few fruits and vegetables, or milk products 0 No I have three or more drinks of beer, liquor or wine almost every day 0 No I have tooth or mouth problems that make it hard for me to eat 0 No I don't always have enough money to buy the food I need 0 No I eat alone most of the time 0 No I take three or more different prescribed or over-the-counter drugs a day 1 Yes Without wanting to, I have lost or gained 10 pounds in the last six months 0 No I am not always physically able to shop, cook and/or feed myself 2 Yes Nutrition Protocols Good Risk Protocol Provide  education on elevated blood Moderate Risk Protocol 0 sugars and impact on wound healing, as applicable High Risk Proctocol Risk Level: Moderate Risk Score: 5 Electronic Signature(s) Signed: 12/24/2022 5:45:54 PM By: [name] Stall RN, BSN Entered By: [name] [name] on 12/24/2022 10:00:12

## 2022-12-25 NOTE — Progress Notes (Signed)
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 Wound #2: Same as Pre-Procedure Plan Follow-up Appointments:  Return Appointment in 1 week. - 01/01/2023 0815 Dr. Leanord Hawking (front office to schedule) Return Appointment in 2 weeks. - Dr. Leanord Hawking 01/05/2023 245pm (front office to schedule) Return appointment in 3 weeks. - Dr. Leanord Hawking (front office to schedule) Other: - Stop all antibiotics by this Friday 12/26/2022. plan for culture of right lateral wound at next appt time. Anesthetic: (In clinic) Topical Lidocaine 5% applied to  wound bed Bathing/ Shower/ Hygiene: May shower with protection but do not get wound dressing(s) wet. Protect dressing(s) with water repellant cover (for example, large plastic bag) or a cast cover and may then take shower. Edema Control - Orders / Instructions: Elevate legs to the level of the heart or above for 30 minutes daily and/or when sitting for 3-4 times a day throughout the day. Avoid standing for long periods of time. Off-Loading: Open toe surgical shoe to: - right foot- use when walking and standing. no bare feet or just socks. Radiology ordered were: X-ray, foot right - x-ray of right foot related to non healing diabetic foot ulcer looking for infection. CPT ICD 10 code E11.621 The following medication(s) was prescribed: lidocaine topical 5 % cream cream topical once daily for applied only in clinic for debridement was prescribed at facility WOUND #1: - T Great Wound Laterality: Right oe Cleanser: Vashe 5.8 (oz) Every Other Day/30 Days Discharge Instructions: Cleanse the wound with Vashe prior to applying a clean dressing using gauze sponges, not tissue or cotton balls. Cleanser: [place] Ancillary Kit - 15 Day Supply (DME) (Generic) Every Other Day/30 Days Discharge Instructions: Use supplies as instructed; Kit contains: (15) Saline Bullets; (15) 3x3 Gauze; 15 pr Gloves Prim Dressing: Maxorb Extra Ag+ Alginate Dressing, 2x2 (in/in) (DME) (Generic) Every Other Day/30 Days ary Discharge Instructions: Apply to wound bed as instructed VENKAT, BROKAW ([id number]) [phone].pdf Page 9 of 11 Secondary Dressing: Optifoam Non-Adhesive Dressing, 4x4 in (DME) (Generic) Every Other Day/30 Days Discharge Instructions: Apply foam donut Secondary Dressing: Woven Gauze Sponges 2x2 in (DME) (Generic) Every Other Day/30 Days Discharge Instructions: Apply over primary dressing as directed. Secured With: Insurance underwriter, Sterile 2x75 (in/in) (DME)  (Generic) Every Other Day/30 Days Discharge Instructions: Secure with stretch gauze as directed. Secured With: 12M Medipore H Soft Cloth Surgical T ape, 4 x 10 (in/yd) (DME) (Generic) Every Other Day/30 Days Discharge Instructions: Secure with tape as directed. WOUND #2: - Amputation Site - T oe Wound Laterality: Right, Lateral, Distal Cleanser: Soap and Water 1 x Per Week/15 Days Discharge Instructions: May shower and wash wound with dial antibacterial soap and water prior to dressing change. Cleanser: Vashe 5.8 (oz) 1 x Per Week/15 Days Discharge Instructions: Cleanse the wound with Vashe prior to applying a clean dressing using gauze sponges, not tissue or cotton balls. Peri-Wound Care: Sween Lotion (Moisturizing lotion) 1 x Per Week/15 Days Discharge Instructions: Apply moisturizing lotion as directed Prim Dressing: Maxorb Extra Ag+ Alginate Dressing, 4x4.75 (in/in) 1 x Per Week/15 Days ary Discharge Instructions: Apply to wound bed as instructed Secondary Dressing: ABD Pad, 8x10 1 x Per Week/15 Days Discharge Instructions: Apply over primary dressing as directed. Com pression Wrap: Urgo K2 Lite, (equivalent to a 3 layer) two layer compression system, regular 1 x Per Week/15 Days Discharge Instructions: Apply Urgo K2 Lite as directed (alternative to 3 layer compression). 1. The proximal part of the surgical wound on the right lateral foot is concerning. This easily probes to the bone. He is definitely going to need an x-ray of the foot  which we will arrange today. I did not attempt to culture this as he is already been on antibiotics and is currently on clindamycin. I asked him to stop the clindamycin and Friday in preparation for cultures, perhaps bone culture of the proximal applied part of the right surgical wound. We will use silver alginate as the primary dressing including the wound on the right great toe. 2. He also has an area on the tip of the right great toe this is much more  superficial 3. He has edema extending into the dorsal foot on the right. Obvious chronic venous insufficiency in the right leg. The edema in the foot is severe enough to proceed with compression wrapping with Urgo K2 lite's. 4. We do not have evidence that the bone is infected therefore this is a Wagner 2 wound at the present time. I may try to culture this next week. 5. We have cautioned about offloading this area rigorously. 6. The patient had a host of questions he obtained from reading things online including hyperbaric oxygen, knee scooters etc. I would like to get an x-ray and cultures and then we will talk about some of these more advanced treatments Electronic Signature(s) Signed: 12/24/2022 5:18:28 PM By: Baltazar Najjar MD Entered By: Baltazar Najjar on 12/24/2022 11:03:48 -------------------------------------------------------------------------------- HxROS Details Patient Name: Date of Service: [name] D, [name] 12/24/2022 9:30 A M Medical Record Number: [id number] Patient Account Number: [id number] Date of Birth/Sex: Treating RN: 1960/08/31 (62 y.o. 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 Hyperlipidemia TETSUO, BUROW ([id number]) [id number] Page 10 of 11 Gastrointestinal Medical History: Past Medical History Notes: GERD Endocrine Medical History: Positive for: Type II Diabetes - 9.4 HgbA1c Time with diabetes: dx  2009 Treated with: Insulin, Oral agents Blood sugar tested every day: Yes Tested : yes daily Musculoskeletal Medical History: Positive for: Osteoarthritis Past Medical History Notes: DDD Neurologic Medical History: Positive for: Neuropathy Psychiatric Medical History: Past Medical History Notes: bipolar type 1 anxiety depression HBO Extended History Items Eyes: Cataracts Immunizations Pneumococcal Vaccine: Received Pneumococcal Vaccination: Yes Received Pneumococcal Vaccination On or After 60th Birthday: Yes Implantable Devices No devices added Hospitalization / Surgery History Type of Hospitalization/Surgery 11/22/22 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 IR US guide vac access left 2018 echo 2010 spine surgery Family and Social History Diabetes: Yes - Mother; Heart Disease: Yes - Father; Never smoker; Marital Status - Divorced; Alcohol Use: Rarely - history socially; Drug Use: No History; Caffeine Use: Never; Financial Concerns: No; Food, Clothing or Shelter Needs: No; Support System Lacking: No Electronic Signature(s) Signed: 12/24/2022 5:18:28 PM By: Baltazar Najjar MD Signed: 12/24/2022 5:45:54 PM By: Shawn Stall RN, BSN Entered By: Shawn Stall on 12/24/2022 10:01:07 -------------------------------------------------------------------------------- SuperBill Details Patient Name: Date of Service: [name] D, [name] 12/24/2022 Medical Record Number: [id number] Patient Account Number: [id number] Date of Birth/Sex: Treating RN: January 31, 1960 (62 y.o. Uilliam, Kazmer ([id number]) [id number] Page 11 of 11 Primary Care Provider: Bethena Roys DDHA Other Clinician: Referring Provider: Treating Provider/Extender: Albertha Ghee in  Treatment: 0 Diagnosis Coding ICD-10 Codes Code Description E11.621 Type 2 diabetes mellitus with foot ulcer T81.31XA Disruption of external  operation (surgical) wound, not elsewhere classified, initial encounter L97.514 Non-pressure chronic ulcer of other part of right foot with necrosis of bone L97.511 Non-pressure chronic ulcer of other part of right foot limited to breakdown of skin E11.42 Type 2 diabetes mellitus with diabetic polyneuropathy Facility Procedures : CPT4 Code: [id number] 9 Description: 9213 - WOUND CARE VISIT-LEV 3 EST PT Modifier: Quantity: 1 : CPT4 Code: [id number] 1 Description: 1042 - DEB SUBQ TISSUE 20 SQ CM/< ICD-10 Diagnosis Description L97.514 Non-pressure chronic ulcer of other part of right foot with necrosis of bone Modifier: Quantity: 1 Physician Procedures : CPT4 Code Description Modifier [id number] 99204 - WC PHYS LEVEL 4 - NEW PT 25 ICD-10 Diagnosis Description E11.621 Type 2 diabetes mellitus with foot ulcer L97.514 Non-pressure chronic ulcer of other part of right foot with necrosis of bone L97.511  Non-pressure chronic ulcer of other part of right foot limited to breakdown of skin T81.31XA Disruption of external operation (surgical) wound, not elsewhere classified, initial encounter Quantity: 1 : [id number] 11042 - WC PHYS SUBQ TISS 20 SQ CM ICD-10 Diagnosis Description L97.514 Non-pressure chronic ulcer of other part of right foot with necrosis of bone Quantity: 1 Electronic Signature(s) Signed: 12/24/2022 5:18:28 PM By: Baltazar Najjar MD Entered By: Baltazar Najjar on 12/24/2022 11:04:49

## 2023-01-01 ENCOUNTER — Encounter (HOSPITAL_BASED_OUTPATIENT_CLINIC_OR_DEPARTMENT_OTHER): Payer: Medicare Other | Admitting: Internal Medicine

## 2023-01-01 DIAGNOSIS — E11621 Type 2 diabetes mellitus with foot ulcer: Secondary | ICD-10-CM | POA: Diagnosis not present

## 2023-01-02 NOTE — Progress Notes (Signed)
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 and standing. no bare feet or just socks. The following medication(s) was prescribed: lidocaine topical 5 % ointment ointment topical once daily was prescribed at facility WOUND #1: - T Great Wound Laterality: Right oe Cleanser: Vashe 5.8 (oz) Every Other Day/30 Days Discharge Instructions: Cleanse the wound with Vashe prior to applying a clean dressing using gauze sponges, not tissue or cotton balls. Cleanser: [place]ram Ancillary Kit - 15 Day Supply (Generic) Every Other Day/30 Days Discharge Instructions: Use supplies as instructed; Kit contains: (15) Saline Bullets; (15) 3x3 Gauze; 15 pr Gloves Prim Dressing: Maxorb Extra Ag+ Alginate Dressing, 2x2 (in/in) (Generic) Every Other Day/30 Days ary Discharge Instructions: Apply to wound bed as instructed Secondary Dressing: Optifoam Non-Adhesive Dressing, 4x4 in (Generic) Every Other Day/30 Days Discharge Instructions: Apply foam  donut Secondary Dressing: Woven Gauze Sponges 2x2 in (Generic) Every Other Day/30 Days Discharge Instructions: Apply over primary dressing as directed. Secured With: Insurance underwriter, Sterile 2x75 (in/in) (Generic) Every Other Day/30 Days Discharge Instructions: Secure with stretch gauze as directed. Secured With: 31M Medipore H Soft Cloth Surgical T ape, 4 x 10 (in/yd) (Generic) Every Other Day/30 Days Discharge Instructions: Secure with tape as directed. WOUND #2: - Amputation Site - T oe Wound Laterality: Right, Lateral, Distal Cleanser: Soap and Water 1 x Per Week/15 Days Discharge Instructions: May shower and wash wound with dial antibacterial soap and water prior to dressing change. Cleanser: Vashe 5.8 (oz) 1 x Per Week/15 Days Discharge Instructions: Cleanse the wound with Vashe prior to applying a clean dressing using gauze sponges, not tissue or cotton balls. Peri-Wound Care: Sween Lotion (Moisturizing lotion) 1 x Per Week/15 Days Discharge Instructions: Apply moisturizing lotion as directed Prim Dressing: Maxorb Extra Ag+ Alginate Dressing, 4x4.75 (in/in) 1 x Per Week/15 Days ary Discharge Instructions: Apply to wound bed as instructed Secondary Dressing: ABD Pad, 8x10 1 x Per Week/15 Days Discharge Instructions: Apply over primary dressing as directed. Com pression Wrap: Urgo K2 Lite, (equivalent to a 3 layer) two layer compression system, regular 1 x Per Week/15 Days Discharge Instructions: Apply Urgo K2 Lite as directed (alternative to 3 layer compression). 1. I continued with silver alginate as the primary dressing here. 2. I emphasized the patient that he has to put his weight back on his heel and the forefoot off loader. Not doing this is one of the major reasons this type of shoe fails and its goal to remove pressure from the right forefoot and lander toes 3. I have reached out to Dr. Annamary Rummage by secure text message. My question is what he thinks of the  x-ray I did, whether we would need to proceed with an MRI or does he agree with the radiologist that and ending which case would extending the amputation proximally be the best choice choice of action. 4. I discussed this with the patient. He is anxious to proceed with any surgery that will get this to close more rapidly. The medical alternative here including bone biopsy, antibiotics and possibly hyperbaric oxygen would be a slow arduous process if surgery was not an option. #5 this does  not look as though anything is spreading into the soft tissue of the foot in terms of infection Electronic Signature(s) Signed: 01/01/2023 5:18:32 PM By: [name] Najjar MD Entered By: [name] [name] on 01/01/2023 09:26:40 [name] ([id number]) [id number].pdf Page 8 of 8 -------------------------------------------------------------------------------- SuperBill Details Patient Name: Date of Service: [name], [name] 01/01/2023 Medical Record Number: [id number] Patient Account Number: [id number] Date of Birth/Sex: Treating RN: [birth year] (62 y.o. 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 mellitus with diabetic polyneuropathy Facility Procedures : CPT4 Code: [id number] Description: 11042 - DEB SUBQ TISSUE 20 SQ CM/< ICD-10 Diagnosis Description L97.514 Non-pressure chronic ulcer of other part of right foot with necrosis of bone  L97.511 Non-pressure chronic ulcer of other part of right foot limited to breakdown of  E11.621 Type 2 diabetes mellitus with foot ulcer Modifier: skin Quantity: 1 Physician Procedures : CPT4 Code Description Modifier [id number] 11042 - WC PHYS SUBQ TISS 20 SQ CM ICD-10 Diagnosis Description L97.514 Non-pressure chronic ulcer of other part of right foot with necrosis of bone L97.511 Non-pressure chronic ulcer of other part of right foot  limited to breakdown of skin E11.621 Type 2 diabetes mellitus with foot ulcer Quantity: 1 Electronic Signature(s) Signed: 01/01/2023 5:18:32 PM By: [name] Najjar MD Entered By: [name] [name] on 01/01/2023 09:27:00

## 2023-01-02 NOTE — Progress Notes (Signed)
[name], [name] ([id number]) [id number] Page 1 of 10 Visit Report for 01/01/2023 Arrival Information Details Patient Name: Date of Service: [name] [name], [name] 01/01/2023 8:15 A M Medical Record Number: [id number] Patient Account Number: [id number] Date of Birth/Sex: Treating RN: December 25, 1960 (62 y.o. Male) Redmond Pulling Primary Care Allysen Lazo: Bethena Roys DDHA Other Clinician: Referring Cristalle Rohm: Treating Sway Guttierrez/Extender: Lucienne Capers KHO KHA L, [name]: 1 Visit Information History Since Last Visit Added or deleted any medications: No Patient Arrived: Ambulatory Any new allergies or adverse reactions: No Arrival Time: 08:44 Had a fall or experienced change in No Accompanied By: self activities of daily living that may affect Transfer Assistance: None risk of falls: Patient Identification Verified: Yes Signs or symptoms of abuse/neglect since last visito No Secondary Verification Process Completed: Yes Hospitalized since last visit: No Patient Requires Transmission-Based Precautions: No Implantable device outside of the clinic excluding No Patient Has Alerts: No cellular tissue based products placed in the center since last visit: Has Dressing in Place as Prescribed: Yes Has Compression in Place as Prescribed: Yes Pain Present Now: No Electronic Signature(s) Signed: 01/01/2023 5:51:07 PM By: Redmond Pulling RN, BSN Entered By: Redmond Pulling on 01/01/2023 08:45:35 -------------------------------------------------------------------------------- Compression Therapy Details Patient Name: Date of Service: [name] [name], [name] 01/01/2023 8:15 A M Medical Record Number: [id number] Patient Account Number: [id number] Date of Birth/Sex: Treating RN: [birth year] (62 y.o. Male) Redmond Pulling Primary Care Ramin Zoll: Bethena Roys DDHA Other Clinician: Referring Ailine Hefferan: Treating Tykeem Lanzer/Extender: Lucienne Capers KHO KHA L, [name]: 1 Compression Therapy Performed for Wound Assessment: Wound #2 Right,Distal,Lateral Amputation Site - Toe Performed By: Timmothy Sours, RN Compression Type: Three Layer Post Procedure Diagnosis Same as Pre-procedure Electronic Signature(s) Signed: 01/01/2023 5:51:07 PM By: Redmond Pulling RN, BSN Entered By: Redmond Pulling on 01/01/2023 10:29:00 [name] ([id number]) [id number] Page 2 of 10 -------------------------------------------------------------------------------- Encounter Discharge Information Details Patient Name: Date of Service: [name] [name], [name] 01/01/2023 8:15 A M Medical Record Number: [id number] Patient Account Number: [id number] Date of Birth/Sex: Treating RN: [birth year] (62 y.o. Male) Redmond Pulling Primary Care Jolanta Cabeza: Bethena Roys DDHA Other Clinician: Referring Olukemi Panchal: Treating Kemari Narez/Extender: Lucienne Capers KHO KHA L, [name]: 1 Encounter Discharge Information Items Post Procedure Vitals Discharge Condition: Stable Temperature (F): 98.3 Ambulatory Status: Ambulatory Pulse (bpm): 57 Discharge Destination: Home Respiratory Rate (breaths/min): 18 Transportation: Private Auto Blood Pressure (mmHg): 157/84 Accompanied By: self Schedule Follow-up Appointment: Yes Clinical Summary of Care: Patient Declined Electronic Signature(s) Signed: 01/01/2023 5:51:07 PM By: Redmond Pulling RN, BSN Entered By: Redmond Pulling on 01/01/2023 09:20:21 -------------------------------------------------------------------------------- Lower Extremity Assessment Details Patient Name: Date of Service: [name] [name], [name] 01/01/2023 8:15 A M Medical Record Number: [id number] Patient Account Number: [id number] Date of Birth/Sex: Treating RN: 12-17-1960 (62 y.o. Male) Redmond Pulling Primary Care Kuper Rennels: Jenny Reichmann, [name]  Other Clinician: Referring Calynn Ferrero: Treating Osten Janek/Extender: Apolonio Schneiders I KHO KHA L, [name]: 1 Edema Assessment Assessed: [Left: No] [Right: No] Edema: [Left: Ye] [Right: s] Calf Left: Right: Point of Measurement: 35 cm From Medial Instep 41 cm Ankle Left: Right: Point of Measurement: 11 cm From Medial Instep 27 cm Vascular Assessment Pulses: Dorsalis Pedis Palpable: [Right:Yes] Extremity colors, hair growth, and conditions: Extremity Color: [Right:Hyperpigmented] Hair Growth  on Extremity: [Right:No] Temperature of Extremity: [Right:Warm] Capillary Refill: [Right:< 3 seconds] Dependent Rubor: [Right:No Yes] Electronic Signature(s) Signed: 01/01/2023 5:51:07 PM By: Redmond Pulling RN, BSN Entered By: Redmond Pulling on 01/01/2023 08:46:34 [name] ([id number]) [id number] Page 3 of 10 -------------------------------------------------------------------------------- Multi Wound Chart Details Patient Name: Date of Service: [name] [name], [name] 01/01/2023 8:15 A M Medical Record Number: [id number] Patient Account Number: [id number] Date of Birth/Sex: Treating RN: [birth year] (62 y.o. Male) Primary Care Ridhaan Dreibelbis: Bethena Roys DDHA Other Clinician: Referring Scott Vanderveer: Treating Korrina Zern/Extender: Lucienne Capers KHO KHA L, [name]: 1 Vital Signs Height(in): 74 Pulse(bpm): 57 Weight(lbs): 236 Blood Pressure(mmHg): 157/84 Body Mass Index(BMI): 30.3 Temperature(F): 98.3 Respiratory Rate(breaths/min): 18 [1:Photos:] [N/A:N/A] Right T Great oe Right, Distal, Lateral Amputation Site - N/A Wound Location: Toe Thermal Burn Thermal Burn N/A Wounding Event: Diabetic Wound/Ulcer of the Lower Diabetic Wound/Ulcer of the Lower N/A Primary Etiology: Extremity Extremity N/A Open Surgical Wound N/A Secondary Etiology: Cataracts, Sleep Apnea, Hypertension, Cataracts, Sleep Apnea,  Hypertension, N/A Comorbid History: Type II Diabetes, Osteoarthritis, Type II Diabetes, Osteoarthritis, Neuropathy Neuropathy 10/22/2022 11/22/2022 N/A Date Acquired: 1 1 N/A Weeks of Treatment: Open Open N/A Wound Status: No No N/A Wound Recurrence: 0.4x0.4x0.1 4.5x2x0.7 N/A Measurements L x W x D (cm) 0.126 7.069 N/A A (cm) : rea 0.013 4.948 N/A Volume (cm) : 70.30% -41.30% N/A % Reduction in A rea: 69.00% -41.30% N/A % Reduction in Volume: Grade 1 Grade 2 N/A Classification: Medium Medium N/A Exudate A mount: Serosanguineous Serosanguineous N/A Exudate Type: red, brown red, brown N/A Exudate Color: Distinct, outline attached Distinct, outline attached N/A Wound Margin: Large (67-100%) Medium (34-66%) N/A Granulation A mount: Red Red, Pink N/A Granulation Quality: Small (1-33%) Medium (34-66%) N/A Necrotic A mount: Eschar, Adherent Slough Adherent Slough N/A Necrotic Tissue: Fat Layer (Subcutaneous Tissue): Yes Fat Layer (Subcutaneous Tissue): Yes N/A Exposed Structures: Fascia: No Bone: Yes Tendon: No Fascia: No Muscle: No Tendon: No Joint: No Muscle: No Bone: No Joint: No Small (1-33%) Small (1-33%) N/A Epithelialization: Debridement - Excisional Debridement - Excisional N/A Debridement: Pre-procedure Verification/Time Out 08:50 08:50 N/A Taken: Lidocaine 5% topical ointment Lidocaine 5% topical ointment N/A Pain Control: Callus, Subcutaneous, Slough Subcutaneous, Slough N/A Tissue Debrided: Skin/Subcutaneous Tissue Skin/Subcutaneous Tissue N/A Level: 0.13 7.06 N/A Debridement A (sq cm): rea Curette Curette N/A Instrument: Minimum Minimum N/A Bleeding: Pressure Pressure N/A Hemostasis A chieved: Procedure was tolerated well Procedure was tolerated well N/A Debridement Treatment Response: 0.4x0.4x0.1 4.5x2x0.7 N/A Post Debridement Measurements L x W x D (cm) [name] [name], [name] [name] ([id number]) [id number] Page 4 of  10 0.013 4.948 N/A Post Debridement Volume: (cm) Callus: Yes Excoriation: No N/A Periwound Skin Texture: Excoriation: No Induration: No Induration: No Callus: No Crepitus: No Crepitus: No Rash: No Rash: No Scarring: No Scarring: No Dry/Scaly: Yes Maceration: No N/A Periwound Skin Moisture: Maceration: No Dry/Scaly: No Atrophie Blanche: No Erythema: Yes N/A Periwound Skin Color: Cyanosis: No Atrophie Blanche: No Ecchymosis: No Cyanosis: No Erythema: No Ecchymosis: No Hemosiderin Staining: No Hemosiderin Staining: No Mottled: No Mottled: No Pallor: No Pallor: No Rubor: No Rubor: No N/A Circumferential N/A Erythema Location: N/A No Abnormality N/A Temperature: Debridement Debridement N/A Procedures Performed: Treatment Notes Electronic Signature(s) Signed: 01/01/2023 5:18:32 PM By: Baltazar Najjar MD Entered By: Baltazar Najjar on 01/01/2023 09:18:18 -------------------------------------------------------------------------------- Multi-Disciplinary Care Plan Details Patient Name: Date of Service: [name] [name], [name] 01/01/2023 8:15 A M Medical Record Number: [id number]  Patient Account Number: [id number] Date of Birth/Sex: Treating RN: September 11, 1960 (62 y.o. Male) Redmond Pulling Primary Care Renaldo Gornick: Bethena Roys DDHA Other Clinician: Referring Alayjah Boehringer: Treating Rayssa Atha/Extender: Lucienne Capers KHO KHA L, [name]: 1 Active Inactive Necrotic Tissue Nursing Diagnoses: Impaired tissue integrity related to necrotic/devitalized tissue Goals: Necrotic/devitalized tissue will be minimized in the wound bed Date Initiated: 12/24/2022 Target Resolution Date: 01/16/2023 Goal Status: Active Patient/caregiver will verbalize understanding of reason and process for debridement of necrotic tissue Date Initiated: 12/24/2022 Target Resolution Date: 01/17/2023 Goal Status: Active Interventions: Assess patient pain level pre-, during  and post procedure and prior to discharge Treatment Activities: Apply topical anesthetic as ordered : 12/24/2022 Notes: Nutrition Nursing Diagnoses: Impaired glucose control: actual or potential Goals: Patient/caregiver agrees to and verbalizes understanding of need to obtain nutritional consultation Date Initiated: 12/24/2022 Target Resolution Date: 01/16/2023 Goal Status: Active ZAYDRIAN, GLOE ([id number]) [phone].pdf Page 5 of 10 Patient/caregiver will maintain therapeutic glucose control Date Initiated: 12/24/2022 Target Resolution Date: 01/16/2023 Goal Status: Active Interventions: Assess HgA1c results as ordered upon admission and as needed Provide education on elevated blood sugars and impact on wound healing Provide education on nutrition Treatment Activities: Obtain HgA1c : 12/24/2022 Patient referred to Primary Care Physician for further nutritional evaluation : 12/24/2022 Notes: Wound/Skin Impairment Nursing Diagnoses: Knowledge deficit related to ulceration/compromised skin integrity Goals: Ulcer/skin breakdown will heal within 14 weeks Date Initiated: 12/24/2022 Target Resolution Date: 04/17/2023 Goal Status: Active Interventions: Assess patient/caregiver ability to perform ulcer/skin care regimen upon admission and as needed Assess ulceration(s) every visit Provide education on ulcer and skin care Treatment Activities: Skin care regimen initiated : 12/24/2022 Topical wound management initiated : 12/24/2022 Notes: Electronic Signature(s) Signed: 01/01/2023 5:51:07 PM By: Redmond Pulling RN, BSN Entered By: Redmond Pulling on 01/01/2023 08:47:08 -------------------------------------------------------------------------------- Pain Assessment Details Patient Name: Date of Service: [name] [name], [name] 01/01/2023 8:15 A M Medical Record Number: [id number] Patient Account Number: [id number] Date of Birth/Sex: Treating RN: 02-Oct-1960  (62 y.o. Male) Redmond Pulling Primary Care Madelene Kaatz: Bethena Roys DDHA Other Clinician: Referring Isay Perleberg: Treating Malani Lees/Extender: Lucienne Capers KHO KHA L, [name]: 1 Active Problems Location of Pain Severity and Description of Pain Patient Has Paino No Site Locations Burch, Breitkreutz Snook S ([id number]) [id number] Page 6 of 10 Pain Management and Medication Current Pain Management: Electronic Signature(s) Signed: 01/01/2023 5:51:07 PM By: Redmond Pulling RN, BSN Entered By: Redmond Pulling on 01/01/2023 08:46:15 -------------------------------------------------------------------------------- Patient/Caregiver Education Details Patient Name: Date of Service: [name] [name], [name] 12/19/2024andnbsp8:15 A M Medical Record Number: [id number] Patient Account Number: [id number] Date of Birth/Gender: Treating RN: 11-Apr-1960 (62 y.o. Male) Redmond Pulling Primary Care Physician: Bethena Roys DDHA Other Clinician: Referring Physician: Treating Physician/Extender: Lucienne Capers KHO KHA L, [name]: 1 Education Assessment Education Provided To: Patient Education Topics Provided Infection: Methods: Explain/Verbal Responses: State content correctly Wound/Skin Impairment: Methods: Explain/Verbal Responses: State content correctly Electronic Signature(s) Signed: 01/01/2023 5:51:07 PM By: Redmond Pulling RN, BSN Entered By: Redmond Pulling on 01/01/2023 08:48:28 -------------------------------------------------------------------------------- Wound Assessment Details Patient Name: Date of Service: [name] [name], [name] 01/01/2023 8:15 A [name] [name], [name] [name] ([id number]) [id number] Page 7 of 10 Medical Record Number: [id number] Patient Account Number: [id number] Date of Birth/Sex: Treating RN: November 12, 1960 (62 y.o. Male) Redmond Pulling Primary Care Oland Arquette: Danielle Dess KHO  KHA L, SHRA  DDHA Other Clinician: Referring Thermon Zulauf: Treating Annjanette Wertenberger/Extender: Apolonio Schneiders I KHO KHA L, [name]: 1 Wound Status Wound Number: 1 Primary Diabetic Wound/Ulcer of the Lower Extremity Etiology: Wound Location: Right T Great oe Wound Open Wounding Event: Thermal Burn Status: Date Acquired: 10/22/2022 Comorbid Cataracts, Sleep Apnea, Hypertension, Type II Diabetes, Weeks Of Treatment: 1 History: Osteoarthritis, Neuropathy Clustered Wound: No Photos Wound Measurements Length: (cm) 0.4 Width: (cm) 0.4 Depth: (cm) 0.1 Area: (cm) 0.126 Volume: (cm) 0.013 % Reduction in Area: 70.3% % Reduction in Volume: 69% Epithelialization: Small (1-33%) Tunneling: No Undermining: No Wound Description Classification: Grade 1 Wound Margin: Distinct, outline attached Exudate Amount: Medium Exudate Type: Serosanguineous Exudate Color: red, brown Foul Odor After Cleansing: No Slough/Fibrino Yes Wound Bed Granulation Amount: Large (67-100%) Exposed Structure Granulation Quality: Red Fascia Exposed: No Necrotic Amount: Small (1-33%) Fat Layer (Subcutaneous Tissue) Exposed: Yes Necrotic Quality: Eschar, Adherent Slough Tendon Exposed: No Muscle Exposed: No Joint Exposed: No Bone Exposed: No Periwound Skin Texture Texture Color No Abnormalities Noted: No No Abnormalities Noted: No Callus: Yes Atrophie Blanche: No Crepitus: No Cyanosis: No Excoriation: No Ecchymosis: No Induration: No Erythema: No Rash: No Hemosiderin Staining: No Scarring: No Mottled: No Pallor: No Moisture Rubor: No No Abnormalities Noted: No Dry / Scaly: Yes Maceration: No Treatment Notes Wound #1 (Toe Great) Wound Laterality: Right Cleanser Vashe 5.8 (oz) Discharge Instruction: Cleanse the wound with Vashe prior to applying a clean dressing using gauze sponges, not tissue or cotton balls. [facility] Ancillary Kit - [address] [name] [name], [name] [name] ([id number])  [id number] Page 8 of 10 Discharge Instruction: Use supplies as instructed; Kit contains: (15) Saline Bullets; (15) 3x3 Gauze; 15 pr Gloves Peri-Wound Care Topical Primary Dressing Maxorb Extra Ag+ Alginate Dressing, 2x2 (in/in) Discharge Instruction: Apply to wound bed as instructed Secondary Dressing Optifoam Non-Adhesive Dressing, 4x4 in Discharge Instruction: Apply foam donut Woven Gauze Sponges 2x2 in Discharge Instruction: Apply over primary dressing as directed. Secured With Conforming Stretch Gauze Bandage, Sterile 2x75 (in/in) Discharge Instruction: Secure with stretch gauze as directed. 21M Medipore H Soft Cloth Surgical T ape, 4 x 10 (in/yd) Discharge Instruction: Secure with tape as directed. Compression Wrap Compression Stockings Add-Ons Electronic Signature(s) Signed: 01/01/2023 5:51:07 PM By: Redmond Pulling RN, BSN Entered By: Redmond Pulling on 01/01/2023 08:41:28 -------------------------------------------------------------------------------- Wound Assessment Details Patient Name: Date of Service: [name] [name], [name] 01/01/2023 8:15 A M Medical Record Number: [id number] Patient Account Number: [id number] Date of Birth/Sex: Treating RN: [birth year] (61 y.o. Male) Redmond Pulling Primary Care Francina Beery: Bethena Roys DDHA Other Clinician: Referring Cortez Flippen: Treating Laticia Vannostrand/Extender: Lucienne Capers KHO KHA L, [name]: 1 Wound Status Wound Number: 2 Primary Diabetic Wound/Ulcer of the Lower Extremity Etiology: Wound Location: Right, Distal, Lateral Amputation Site - Toe Secondary Open Surgical Wound Wounding Event: Thermal Burn Etiology: Date Acquired: 11/22/2022 Wound Status: Open Weeks Of Treatment: 1 Comorbid Cataracts, Sleep Apnea, Hypertension, Type II Diabetes, Clustered Wound: No History: Osteoarthritis, Neuropathy Photos Wound Measurements Length: (cm) 4.5 Width: (cm) 2 [name] [name], [name] [name] ([id number]) Depth: (cm) 0.7 Area: (cm) 7.069 Volume: (cm) 4.948 % Reduction in Area: -41.3% % Reduction in Volume: -41.3% [id number] Page 9 of 10 Epithelialization: Small (1-33%) Tunneling: No Undermining: No Wound Description Classification: Grade 2 Wound Margin: Distinct, outline attached Exudate Amount: Medium Exudate Type: Serosanguineous Exudate Color: red, brown Foul Odor After Cleansing: No Slough/Fibrino Yes Wound Bed Granulation Amount: Medium (34-66%) Exposed Structure Granulation  Quality: Red, Pink Fascia Exposed: No Necrotic Amount: Medium (34-66%) Fat Layer (Subcutaneous Tissue) Exposed: Yes Necrotic Quality: Adherent Slough Tendon Exposed: No Muscle Exposed: No Joint Exposed: No Bone Exposed: Yes Periwound Skin Texture Texture Color No Abnormalities Noted: No No Abnormalities Noted: No Callus: No Atrophie Blanche: No Crepitus: No Cyanosis: No Excoriation: No Ecchymosis: No Induration: No Erythema: Yes Rash: No Erythema Location: Circumferential Scarring: No Hemosiderin Staining: No Mottled: No Moisture Pallor: No No Abnormalities Noted: No Rubor: No Dry / Scaly: No Maceration: No Temperature / Pain Temperature: No Abnormality Treatment Notes Wound #2 (Amputation Site - Toe) Wound Laterality: Right, Lateral, Distal Cleanser Soap and Water Discharge Instruction: May shower and wash wound with dial antibacterial soap and water prior to dressing change. Vashe 5.8 (oz) Discharge Instruction: Cleanse the wound with Vashe prior to applying a clean dressing using gauze sponges, not tissue or cotton balls. Peri-Wound Care Sween Lotion (Moisturizing lotion) Discharge Instruction: Apply moisturizing lotion as directed Topical Primary Dressing Maxorb Extra Ag+ Alginate Dressing, 4x4.75 (in/in) Discharge Instruction: Apply to wound bed as instructed Secondary Dressing ABD Pad, 8x10 Discharge Instruction: Apply  over primary dressing as directed. Secured With Compression Wrap Urgo K2 Lite, (equivalent to a 3 layer) two layer compression system, regular Discharge Instruction: Apply Urgo K2 Lite as directed (alternative to 3 layer compression). Compression Stockings Add-Ons Electronic Signature(s) Signed: 01/01/2023 5:51:07 PM By: Redmond Pulling RN, BSN Entered By: Redmond Pulling on 01/01/2023 08:40:38 [name] ([id number]) [id number] Page 10 of 10 -------------------------------------------------------------------------------- Vitals Details Patient Name: Date of Service: [name] [name], [name] 01/01/2023 8:15 A M Medical Record Number: [id number] Patient Account Number: [id number] Date of Birth/Sex: Treating RN: [birth year] (62 y.o. Male) Redmond Pulling Primary Care Deontay Ladnier: Bethena Roys DDHA Other Clinician: Referring Earsel Shouse: Treating Suellen Durocher/Extender: Lucienne Capers KHO KHA L, [name]: 1 Vital Signs Time Taken: 08:30 Temperature (F): 98.3 Height (in): 74 Pulse (bpm): 57 Weight (lbs): 236 Respiratory Rate (breaths/min): 18 Body Mass Index (BMI): 30.3 Blood Pressure (mmHg): 157/84 Reference Range: 80 - 120 mg / dl Electronic Signature(s) Signed: 01/01/2023 5:51:07 PM By: Redmond Pulling RN, BSN Entered By: Redmond Pulling on 01/01/2023 08:46:03

## 2023-01-05 ENCOUNTER — Encounter (HOSPITAL_BASED_OUTPATIENT_CLINIC_OR_DEPARTMENT_OTHER): Payer: Medicare Other | Admitting: Internal Medicine

## 2023-01-05 DIAGNOSIS — E11621 Type 2 diabetes mellitus with foot ulcer: Secondary | ICD-10-CM | POA: Diagnosis not present

## 2023-01-06 NOTE — Progress Notes (Signed)
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 Patient Name: Date of Service: [name], [name] 01/05/2023 Medical Record Number: [id number] Patient Account Number: [id number] Date of Birth/Sex: Treating RN: January 24, 1960 (62 y.o. Tammy Sours Primary Care Provider: Bethena [name] DDHA Other Clinician: Referring Provider: Treating Provider/Extender: [name] [name] KHO KHA L, [name]: 1 Diagnosis Coding ICD-10 Codes Code Description E11.621 Type 2 diabetes mellitus with foot ulcer T81.31XA Disruption of external operation (surgical) wound, not elsewhere classified, initial encounter L97.514 Non-pressure chronic ulcer of other part of right foot with necrosis of bone L97.511 Non-pressure chronic ulcer of other part of right foot limited to breakdown of skin E11.42 Type 2 diabetes mellitus with diabetic polyneuropathy Facility Procedures : CPT4 Code: 62952841 Description: 11042 - DEB SUBQ TISSUE 20 SQ CM/< ICD-10 Diagnosis Description L97.514 Non-pressure chronic ulcer  of other part of right foot with necrosis of bone E11.621 Type 2 diabetes mellitus with foot ulcer Modifier: Quantity: 1 Physician Procedures : CPT4 Code Description Modifier [id number] 11042 - WC PHYS SUBQ TISS 20 SQ CM ICD-10 Diagnosis Description L97.514 Non-pressure chronic ulcer of other part of right foot with necrosis of bone E11.621 Type 2 diabetes mellitus with foot ulcer Quantity: 1 Electronic Signature(s) Signed: 01/06/2023 11:51:18 AM By: Baltazar Najjar MD Entered By: Baltazar Najjar on 01/05/2023 12:29:03

## 2023-01-08 ENCOUNTER — Ambulatory Visit: Payer: Medicare Other | Admitting: Podiatry

## 2023-01-08 ENCOUNTER — Encounter: Payer: Self-pay | Admitting: Podiatry

## 2023-01-08 VITALS — Ht 74.0 in | Wt 241.0 lb

## 2023-01-08 DIAGNOSIS — I96 Gangrene, not elsewhere classified: Secondary | ICD-10-CM

## 2023-01-08 DIAGNOSIS — M86171 Other acute osteomyelitis, right ankle and foot: Secondary | ICD-10-CM

## 2023-01-08 DIAGNOSIS — E1142 Type 2 diabetes mellitus with diabetic polyneuropathy: Secondary | ICD-10-CM

## 2023-01-08 DIAGNOSIS — Z9889 Other specified postprocedural states: Secondary | ICD-10-CM

## 2023-01-08 MED ORDER — CLINDAMYCIN HCL 300 MG PO CAPS: 300.0000 mg | ORAL_CAPSULE | Freq: Three times a day (TID) | ORAL | 0 refills | Status: AC

## 2023-01-08 NOTE — Progress Notes (Signed)
  Subjective:  Patient ID: [name] [name], male    DOB: Mar 03, 1960,  MRN: [id number]  Chief Complaint  Patient presents with   Routine Post Op    DOS: 11/22/2022 Procedure: Right foot partial fifth ray amputation  62 y.o. male returns for post-op check.  Patient is now 6 weeks  status post above procedure.  Still has a wound on the outside of his right foot.  Has been going to wound care.  Of note the wound care doctor he was seeing did message me to notify me that the patient had an x-ray done at the wound care center and there was concern for residual osteomyelitis of the distal fifth metatarsal.  I discussed with the provider and he did not feel there was acute infection that needed urgent surgery so the decision was made to have the patient follow-up in office rather than send to the emergency department right then.  The patient notes that most the wound has been healing however there is a portion that is still open and draining and fails to heal.  Review of Systems: Negative except as noted in the HPI. Denies N/V/F/Ch.   Objective:  There were no vitals filed for this visit. Body mass index is 30.94 kg/m. Constitutional Well developed. Well nourished.  Vascular Foot warm and well perfused. Capillary refill normal to all digits.  Calf is soft and supple, no posterior calf or knee pain, negative Homans' sign  Neurologic Normal speech. Oriented to person, place, and time. Epicritic sensation to light touch grossly present bilaterally.  Dermatologic At the partial fifth ray amputation site proximally there is an area of wound with significant fibrotic and necrotic tissue present that does probe down to the fifth metatarsal bone.  No active drainage there is some erythema surrounding the area.    Orthopedic: No tenderness to palpation noted about the surgical site.   Multiple view plain film radiographs: XR from 12/24/2022 wound care center.  Findings:Redemonstration of amputation  of the fifth ray to the distal metatarsal shaft. Apparent lucent soft tissue ulceration just lateral to the distal shaft of the remaining fifth metatarsal. New scattered lucency within the distal approximate 2.3 cm of the remaining fifth metatarsal. This is concerning for acute osteomyelitis. Assessment:   1. Status post foot surgery   2. Other acute osteomyelitis of right foot (HCC)   3. Gangrene of toe of right foot (HCC)   4. DM type 2 with diabetic peripheral neuropathy (HCC)       Plan:  Patient was evaluated and treated and all questions answered.  S/p foot surgery right partial fifth ray amputation -Now with concern for acute osteomyelitis at the surgical site -Patient will need revision fifth ray amputation with wound excision and hopefully closure -Discussed the above with the patient as he he is in agreement.  Discussed risk benefits alternatives and possible complications and he wishes to proceed.  For surgical site was obtained at this visit.  -XR: As above from wound care center -WB Status: Weightbearing as tolerated in in [employer] shoe.   -Sutures: Previously removed -Medications: eRx for clindamycin 300 mg 3 times daily for 7 to 10 days -Foot redressed with Betadine wet-to-dry leave intact until surgery next week Friday         Corinna Gab, DPM [facility] / [facility]

## 2023-01-08 NOTE — Progress Notes (Signed)
[name] [name], [name] [name] ([id number]) [id number] Page 1 of 10 Visit Report for 01/05/2023 Arrival Information Details Patient Name: Date of Service: [name], [name] [name]. 01/05/2023 2:45 PM Medical Record Number: [id number] Patient Account Number: [id number] Date of Birth/Sex: Treating RN: 02-18-1960 (62 y.o. M) Primary Care Shatira Dobosz: Bethena Roys DDHA Other Clinician: Referring Vlada Uriostegui: Treating Jesseca Marsch/Extender: Lucienne Capers KHO [name] [name], [name]: 1 Visit Information History Since Last Visit Added or deleted any medications: No Patient Arrived: Walker Any new allergies or adverse reactions: No Arrival Time: 14:41 Had a fall or experienced change in No Accompanied By: self activities of daily living that may affect Transfer Assistance: None risk of falls: Patient Identification Verified: Yes Signs or symptoms of abuse/neglect since last visito No Secondary Verification Process Completed: Yes Hospitalized since last visit: No Patient Requires Transmission-Based Precautions: No Implantable device outside of the clinic excluding No Patient Has Alerts: No cellular tissue based products placed in the center since last visit: Has Dressing in Place as Prescribed: Yes Has Compression in Place as Prescribed: Yes Pain Present Now: No Electronic Signature([name]) Signed: 01/08/2023 4:00:31 PM By: Thayer Dallas Entered By: Thayer Dallas on 01/05/2023 11:44:28 -------------------------------------------------------------------------------- Encounter Discharge Information Details Patient Name: Date of Service: [name], [name] [name]. 01/05/2023 2:45 PM Medical Record Number: [id number] Patient Account Number: [id number] Date of Birth/Sex: Treating RN: [birth year] (62 y.o. [name] [name] Primary Care Kassi Esteve: Bethena Roys DDHA Other Clinician: Referring Rakeya Glab: Treating Shivonne Schwartzman/Extender: Lucienne Capers KHO [name]  [name], [name]: 1 Encounter Discharge Information Items Post Procedure Vitals Discharge Condition: Stable Temperature (F): 99.2 Ambulatory Status: Walker Pulse (bpm): 51 Discharge Destination: Home Respiratory Rate (breaths/min): 18 Transportation: Private Auto Blood Pressure (mmHg): 146/88 Accompanied By: self Schedule Follow-up Appointment: Yes Clinical Summary of Care: Electronic Signature([name]) Signed: 01/05/2023 4:50:55 PM By: Shawn Stall RN, BSN Entered By: Shawn Stall on 01/05/2023 12:19:19 [name] [name] ([id number]) [id number] Page 2 of 10 -------------------------------------------------------------------------------- Lower Extremity Assessment Details Patient Name: Date of Service: [name], [name] [name]. 01/05/2023 2:45 PM Medical Record Number: [id number] Patient Account Number: [id number] Date of Birth/Sex: Treating RN: May 09, 1960 (62 y.o. M) Primary Care Recie Cirrincione: Bethena Roys DDHA Other Clinician: Referring Nahshon Reich: Treating Lenford Beddow/Extender: Lucienne Capers KHO [name] [name], [name]: 1 Edema Assessment Assessed: [Left: No] [Right: No] Edema: [Left: Ye] [Right: [name]] Calf Left: Right: Point of Measurement: 35 cm From Medial Instep 40.5 cm Ankle Left: Right: Point of Measurement: 11 cm From Medial Instep 28.2 cm Vascular Assessment Extremity colors, hair growth, and conditions: Extremity Color: [Right:Hyperpigmented] Hair Growth on Extremity: [Right:No] Temperature of Extremity: [Right:Warm] Capillary Refill: [Right:< 3 seconds] Dependent Rubor: [Right:No Yes] Electronic Signature([name]) Signed: 01/08/2023 4:00:31 PM By: Thayer Dallas Entered By: Thayer Dallas on 01/05/2023 11:55:20 -------------------------------------------------------------------------------- Multi Wound Chart Details Patient Name: Date of Service: [name], [name] [name]. 01/05/2023 2:45 PM Medical Record  Number: [id number] Patient Account Number: [id number] Date of Birth/Sex: Treating RN: Jul 01, 1960 (62 y.o. M) Primary Care Brailyn Killion: Bethena Roys DDHA Other Clinician: Referring Clifton Kovacic: Treating [name] [name]: Lucienne Capers KHO [name] [name], [name]: 1 Vital Signs Height(in): 74 Pulse(bpm): 51 Weight(lbs): 236 Blood Pressure(mmHg): 146/88 Body Mass Index(BMI): 30.3 Temperature(F): 99.2 Respiratory Rate(breaths/min): 18 [1:Photos:] [N/A:N/A [id number] Page 3 of 10] Right T Great oe Right, Distal,  Lateral Amputation Site - N/A Wound Location: Toe Thermal Burn Thermal Burn N/A Wounding Event: Diabetic Wound/Ulcer of the Lower Diabetic Wound/Ulcer of the Lower N/A Primary Etiology: Extremity Extremity N/A Open Surgical Wound N/A Secondary Etiology: Cataracts, Sleep Apnea, Hypertension, Cataracts, Sleep Apnea, Hypertension, N/A Comorbid History: Type II Diabetes, Osteoarthritis, Type II Diabetes, Osteoarthritis, Neuropathy Neuropathy 10/22/2022 11/22/2022 N/A Date Acquired: 1 1 N/A Weeks of Treatment: Open Open N/A Wound Status: No No N/A Wound Recurrence: 1x0.3x0.1 4.4x1.3x0.8 N/A Measurements [name] x W x D (cm) 0.236 4.492 N/A A (cm) : rea 0.024 3.594 N/A Volume (cm) : 44.30% 10.20% N/A % Reduction in A rea: 42.90% -2.60% N/A % Reduction in Volume: Grade 1 Grade 2 N/A Classification: Medium Medium N/A Exudate A mount: Serosanguineous Serosanguineous N/A Exudate Type: red, brown red, brown N/A Exudate Color: Distinct, outline attached Distinct, outline attached N/A Wound Margin: Large (67-100%) Small (1-33%) N/A Granulation A mount: Red Red, Pink N/A Granulation Quality: Small (1-33%) Large (67-100%) N/A Necrotic A mount: Eschar, Adherent Slough Adherent Slough N/A Necrotic Tissue: Fat Layer (Subcutaneous Tissue): Yes Fat Layer (Subcutaneous Tissue): Yes N/A Exposed Structures: Fascia: No Bone:  Yes Tendon: No Fascia: No Muscle: No Tendon: No Joint: No Muscle: No Bone: No Joint: No Small (1-33%) Small (1-33%) N/A Epithelialization: N/A Debridement - Excisional N/A Debridement: Pre-procedure Verification/Time Out N/A 15:10 N/A Taken: N/A Lidocaine 4% T opical Solution N/A Pain Control: N/A Subcutaneous, Slough N/A Tissue Debrided: N/A Skin/Subcutaneous Tissue N/A Level: N/A 4.49 N/A Debridement A (sq cm): rea N/A Curette N/A Instrument: N/A Moderate N/A Bleeding: N/A Silver Nitrate N/A Hemostasis A chieved: N/A 0 N/A Procedural Pain: N/A 0 N/A Post Procedural Pain: N/A Procedure was tolerated well N/A Debridement Treatment Response: N/A 4.4x1.3x0.8 N/A Post Debridement Measurements [name] x W x D (cm) N/A 3.594 N/A Post Debridement Volume: (cm) Callus: Yes Excoriation: No N/A Periwound Skin Texture: Excoriation: No Induration: No Induration: No Callus: No Crepitus: No Crepitus: No Rash: No Rash: No Scarring: No Scarring: No Dry/Scaly: Yes Maceration: No N/A Periwound Skin Moisture: Maceration: No Dry/Scaly: No Atrophie Blanche: No Erythema: Yes N/A Periwound Skin Color: Cyanosis: No Atrophie Blanche: No Ecchymosis: No Cyanosis: No Erythema: No Ecchymosis: No Hemosiderin Staining: No Hemosiderin Staining: No Mottled: No Mottled: No Pallor: No Pallor: No Rubor: No Rubor: No N/A Circumferential N/A Erythema Location: N/A No Abnormality N/A Temperature: N/A Debridement N/A Procedures Performed: Treatment Notes Wound #1 (Toe Great) Wound Laterality: Right Cleanser Vashe 5.8 (oz) Discharge Instruction: Cleanse the wound with Vashe prior to applying a clean dressing using gauze sponges, not tissue or cotton balls. Byram Ancillary Kit - 15 Day Supply Discharge Instruction: Use supplies as instructed; Kit contains: (15) Saline Bullets; (15) 3x3 Gauze; [address] GIANNO, RONDAN ([id number]) [id number]  Page 4 of 10 Peri-Wound Care Topical Primary Dressing Maxorb Extra Ag+ Alginate Dressing, 2x2 (in/in) Discharge Instruction: Apply to wound bed as instructed Secondary Dressing Optifoam Non-Adhesive Dressing, 4x4 in Discharge Instruction: Apply foam donut Woven Gauze Sponges 2x2 in Discharge Instruction: Apply over primary dressing as directed. Secured With Conforming Stretch Gauze Bandage, Sterile 2x75 (in/in) Discharge Instruction: Secure with stretch gauze as directed. 53M Medipore H Soft Cloth Surgical T ape, 4 x 10 (in/yd) Discharge Instruction: Secure with tape as directed. Compression Wrap Compression Stockings Add-Ons Wound #2 (Amputation Site - Toe) Wound Laterality: Right, Lateral, Distal Cleanser Soap and Water Discharge Instruction: May shower and wash wound with dial antibacterial soap and water prior to dressing change. Vashe 5.8 (oz) Discharge  Instruction: Cleanse the wound with Vashe prior to applying a clean dressing using gauze sponges, not tissue or cotton balls. Peri-Wound Care Sween Lotion (Moisturizing lotion) Discharge Instruction: Apply moisturizing lotion as directed Topical Primary Dressing Maxorb Extra Ag+ Alginate Dressing, 4x4.75 (in/in) Discharge Instruction: Apply to wound bed as instructed Secondary Dressing ABD Pad, 8x10 Discharge Instruction: Apply over primary dressing as directed. Secured With [employer], 4.5x3.1 (in/yd) Discharge Instruction: Secure with Kerlix as directed. 4M Medipore H Soft Cloth Surgical T ape, 4 x 10 (in/yd) Discharge Instruction: Secure with tape as directed. Compression Wrap Compression Stockings Add-Ons Electronic Signature([name]) Signed: 01/06/2023 11:51:18 AM By: Baltazar Najjar MD Entered By: Baltazar Najjar on 01/05/2023 12:23:59 [name] [name] ([id number]) [id number] Page 5 of  10 -------------------------------------------------------------------------------- Multi-Disciplinary Care Plan Details Patient Name: Date of Service: [name], [name] [name]. 01/05/2023 2:45 PM Medical Record Number: [id number] Patient Account Number: [id number] Date of Birth/Sex: Treating RN: May 27, 1960 (62 y.o. [name] [name] Primary Care Rozlynn Lippold: Bethena Roys DDHA Other Clinician: Referring Harol Shabazz: Treating Kellene Mccleary/Extender: Lucienne Capers KHO [name] [name], [name]: 1 Active Inactive Necrotic Tissue Nursing Diagnoses: Impaired tissue integrity related to necrotic/devitalized tissue Goals: Necrotic/devitalized tissue will be minimized in the wound bed Date Initiated: 12/24/2022 Target Resolution Date: 01/16/2023 Goal Status: Active Patient/caregiver will verbalize understanding of reason and process for debridement of necrotic tissue Date Initiated: 12/24/2022 Date Inactivated: 01/05/2023 Target Resolution Date: 01/17/2023 Goal Status: Met Interventions: Assess patient pain level pre-, during and post procedure and prior to discharge Treatment Activities: Apply topical anesthetic as ordered : 12/24/2022 Notes: Nutrition Nursing Diagnoses: Impaired glucose control: actual or potential Goals: Patient/caregiver agrees to and verbalizes understanding of need to obtain nutritional consultation Date Initiated: 12/24/2022 Date Inactivated: 01/05/2023 Target Resolution Date: 01/16/2023 Goal Status: Met Patient/caregiver will maintain therapeutic glucose control Date Initiated: 12/24/2022 Target Resolution Date: 01/16/2023 Goal Status: Active Interventions: Assess HgA1c results as ordered upon admission and as needed Provide education on elevated blood sugars and impact on wound healing Provide education on nutrition Treatment Activities: Obtain HgA1c : 12/24/2022 Patient referred to Primary Care Physician for further nutritional evaluation :  12/24/2022 Notes: Wound/Skin Impairment Nursing Diagnoses: Knowledge deficit related to ulceration/compromised skin integrity Goals: Ulcer/skin breakdown will heal within 14 weeks Date Initiated: 12/24/2022 Target Resolution Date: 04/17/2023 Goal Status: Active Interventions: Assess patient/caregiver ability to perform ulcer/skin care regimen upon admission and as needed Assess ulceration([name]) every visit Provide education on ulcer and skin care Treatment Activities: Skin care regimen initiated : 12/24/2022 Topical wound management initiated : 12/24/2022 ADREN, BISSONNETTE ([id number]) [phone].pdf Page 6 of 10 Notes: Electronic Signature([name]) Signed: 01/05/2023 4:50:55 PM By: Shawn Stall RN, BSN Entered By: Shawn Stall on 01/05/2023 12:12:55 -------------------------------------------------------------------------------- Pain Assessment Details Patient Name: Date of Service: SMA [name], [name] [name]. 01/05/2023 2:45 PM Medical Record Number: [id number] Patient Account Number: [id number] Date of Birth/Sex: Treating RN: Jun 13, 1960 (62 y.o. M) Primary Care Eulla Kochanowski: Bethena Roys DDHA Other Clinician: Referring Lyndee Herbst: Treating [name] [name]: Lucienne Capers KHO [name] [name], [name]: 1 Active Problems Location of Pain Severity and Description of Pain Patient Has Paino No Site Locations Pain Management and Medication Current Pain Management: Electronic Signature([name]) Signed: 01/08/2023 4:00:31 PM By: Thayer Dallas Entered By: Thayer Dallas on 01/05/2023 11:54:59 -------------------------------------------------------------------------------- Patient/Caregiver Education Details Patient Name: Date of Service: [name], [name] [name]. 12/23/2024andnbsp2:45 PM Medical Record Number: [id number] Patient  Account Number: [id number] Date of Birth/Gender: Treating RN: [birth year] (62 y.o. [name] [name], Millard.Loa Primary Care Physician:  Bethena Roys DDHA Other Clinician: Referring Physician: Treating Physician/Extender: [name], [name]: 1 Education [address] [name] [name], [name] [name] ([id number]) [id number] Page 7 of 10 Education Provided To: Patient Education Topics Provided Wound/Skin Impairment: Handouts: Caring for Your Ulcer Methods: Explain/Verbal Responses: Reinforcements needed Electronic Signature([name]) Signed: 01/05/2023 4:50:55 PM By: Shawn Stall RN, BSN Entered By: Shawn Stall on 01/05/2023 12:14:14 -------------------------------------------------------------------------------- Wound Assessment Details Patient Name: Date of Service: SMA [name], [name] [name]. 01/05/2023 2:45 PM Medical Record Number: [id number] Patient Account Number: [id number] Date of Birth/Sex: Treating RN: 1960-01-14 (62 y.o. M) Primary Care Jabaree Mercado: Bethena Roys DDHA Other Clinician: Referring Manvi Guilliams: Treating Khalif Stender/Extender: Lucienne Capers KHO [name] [name], [name]: 1 Wound Status Wound Number: 1 Primary Diabetic Wound/Ulcer of the Lower Extremity Etiology: Wound Location: Right T Great oe Wound Open Wounding Event: Thermal Burn Status: Date Acquired: 10/22/2022 Comorbid Cataracts, Sleep Apnea, Hypertension, Type II Diabetes, Weeks Of Treatment: 1 History: Osteoarthritis, Neuropathy Clustered Wound: No Photos Wound Measurements Length: (cm) 1 Width: (cm) 0.3 Depth: (cm) 0.1 Area: (cm) 0.236 Volume: (cm) 0.024 % Reduction in Area: 44.3% % Reduction in Volume: 42.9% Epithelialization: Small (1-33%) Tunneling: No Undermining: No Wound Description Classification: Grade 1 Wound Margin: Distinct, outline attached Exudate Amount: Medium Exudate Type: Serosanguineous Exudate Color: red, brown Foul Odor After Cleansing: No Slough/Fibrino Yes Wound Bed Granulation Amount: Large (67-100%) Exposed  Structure Granulation Quality: Red Fascia Exposed: No Necrotic Amount: Small (1-33%) Fat Layer (Subcutaneous Tissue) Exposed: Yes Necrotic Quality: Eschar, Adherent Slough Tendon Exposed: No Muscle Exposed: No [name] [name], [name] [name] ([id number]) [id number] Page 8 of 10 Joint Exposed: No Bone Exposed: No Periwound Skin Texture Texture Color No Abnormalities Noted: No No Abnormalities Noted: No Callus: Yes Atrophie Blanche: No Crepitus: No Cyanosis: No Excoriation: No Ecchymosis: No Induration: No Erythema: No Rash: No Hemosiderin Staining: No Scarring: No Mottled: No Pallor: No Moisture Rubor: No No Abnormalities Noted: No Dry / Scaly: Yes Maceration: No Treatment Notes Wound #1 (Toe Great) Wound Laterality: Right Cleanser Vashe 5.8 (oz) Discharge Instruction: Cleanse the wound with Vashe prior to applying a clean dressing using gauze sponges, not tissue or cotton balls. Byram Ancillary Kit - 15 Day Supply Discharge Instruction: Use supplies as instructed; Kit contains: (15) Saline Bullets; (15) 3x3 Gauze; 15 pr Gloves Peri-Wound Care Topical Primary Dressing Maxorb Extra Ag+ Alginate Dressing, 2x2 (in/in) Discharge Instruction: Apply to wound bed as instructed Secondary Dressing Optifoam Non-Adhesive Dressing, 4x4 in Discharge Instruction: Apply foam donut Woven Gauze Sponges 2x2 in Discharge Instruction: Apply over primary dressing as directed. Secured With Conforming Stretch Gauze Bandage, Sterile 2x75 (in/in) Discharge Instruction: Secure with stretch gauze as directed. 50M Medipore H Soft Cloth Surgical T ape, 4 x 10 (in/yd) Discharge Instruction: Secure with tape as directed. Compression Wrap Compression Stockings Add-Ons Electronic Signature([name]) Signed: 01/08/2023 4:00:31 PM By: Thayer Dallas Entered By: Thayer Dallas on 01/05/2023 12:05:46 -------------------------------------------------------------------------------- Wound  Assessment Details Patient Name: Date of Service: [name], [name] [name]. 01/05/2023 2:45 PM Medical Record Number: [id number] Patient Account Number: [id number] Date of Birth/Sex: Treating RN: 28-Apr-1960 (62 y.o. M) Primary Care Loleta Frommelt: Bethena Roys DDHA Other Clinician: Referring Ajani Schnieders: Treating [name] [name]: Apolonio Schneiders I KHO [name] [name], [name]: 1 Dobler, [name] [name] (  [id number]) [id number] Page 9 of 10 Wound Status Wound Number: 2 Primary Diabetic Wound/Ulcer of the Lower Extremity Etiology: Wound Location: Right, Distal, Lateral Amputation Site - Toe Secondary Open Surgical Wound Wounding Event: Thermal Burn Etiology: Date Acquired: 11/22/2022 Wound Status: Open Weeks Of Treatment: 1 Comorbid Cataracts, Sleep Apnea, Hypertension, Type II Diabetes, Clustered Wound: No History: Osteoarthritis, Neuropathy Photos Wound Measurements Length: (cm) 4.4 Width: (cm) 1.3 Depth: (cm) 0.8 Area: (cm) 4.492 Volume: (cm) 3.594 % Reduction in Area: 10.2% % Reduction in Volume: -2.6% Epithelialization: Small (1-33%) Tunneling: No Undermining: No Wound Description Classification: Grade 2 Wound Margin: Distinct, outline attached Exudate Amount: Medium Exudate Type: Serosanguineous Exudate Color: red, brown Foul Odor After Cleansing: No Slough/Fibrino Yes Wound Bed Granulation Amount: Small (1-33%) Exposed Structure Granulation Quality: Red, Pink Fascia Exposed: No Necrotic Amount: Large (67-100%) Fat Layer (Subcutaneous Tissue) Exposed: Yes Necrotic Quality: Adherent Slough Tendon Exposed: No Muscle Exposed: No Joint Exposed: No Bone Exposed: Yes Periwound Skin Texture Texture Color No Abnormalities Noted: No No Abnormalities Noted: No Callus: No Atrophie Blanche: No Crepitus: No Cyanosis: No Excoriation: No Ecchymosis: No Induration: No Erythema: Yes Rash: No Erythema Location:  Circumferential Scarring: No Hemosiderin Staining: No Mottled: No Moisture Pallor: No No Abnormalities Noted: No Rubor: No Dry / Scaly: No Maceration: No Temperature / Pain Temperature: No Abnormality Treatment Notes Wound #2 (Amputation Site - Toe) Wound Laterality: Right, Lateral, Distal Cleanser Soap and Water Discharge Instruction: May shower and wash wound with dial antibacterial soap and water prior to dressing change. Vashe 5.8 (oz) Discharge Instruction: Cleanse the wound with Vashe prior to applying a clean dressing using gauze sponges, not tissue or cotton balls. Peri-Wound Care [address] (Moisturizing lotion) [name] [name], [name] [name] ([id number]) [id number] Page 10 of 10 Discharge Instruction: Apply moisturizing lotion as directed Topical Primary Dressing Maxorb Extra Ag+ Alginate Dressing, 4x4.75 (in/in) Discharge Instruction: Apply to wound bed as instructed Secondary Dressing ABD Pad, 8x10 Discharge Instruction: Apply over primary dressing as directed. Secured With [employer], 4.5x3.1 (in/yd) Discharge Instruction: Secure with Kerlix as directed. 18M Medipore H Soft Cloth Surgical T ape, 4 x 10 (in/yd) Discharge Instruction: Secure with tape as directed. Compression Wrap Compression Stockings Add-Ons Electronic Signature([name]) Signed: 01/08/2023 4:00:31 PM By: Thayer Dallas Entered By: Thayer Dallas on 01/05/2023 12:06:18 -------------------------------------------------------------------------------- Vitals Details Patient Name: Date of Service: [name], [name] [name]. 01/05/2023 2:45 PM Medical Record Number: [id number] Patient Account Number: [id number] Date of Birth/Sex: Treating RN: Sep 19, 1960 (62 y.o. M) Primary Care Derral Colucci: Bethena Roys DDHA Other Clinician: Referring Matthe Sloane: Treating Anvi Mangal/Extender: Lucienne Capers KHO [name] [name], [name]: 1 Vital Signs Time Taken:  14:50 Temperature (F): 99.2 Height (in): 74 Pulse (bpm): 51 Weight (lbs): 236 Respiratory Rate (breaths/min): 18 Body Mass Index (BMI): 30.3 Blood Pressure (mmHg): 146/88 Reference Range: 80 - 120 mg / dl Electronic Signature([name]) Signed: 01/08/2023 4:00:31 PM By: Thayer Dallas Entered By: Thayer Dallas on 01/05/2023 11:51:56

## 2023-01-12 ENCOUNTER — Telehealth: Payer: Self-pay | Admitting: Podiatry

## 2023-01-12 NOTE — Telephone Encounter (Signed)
DOS-01/16/23  PRTL EXC B1 TARSAL/METAR B1 XCP TALUS/CALCANEUS RT- [id number] REPAIR COMPLEX RT-[id number]  [facility] EFFECTIVE DATE- 12/14/22  DEDUCTIBLE- $0.00 WITH REMAINING $0.00 OOP-$4500.00 WITH REMAINING $4139.49 COINSURANCE- 0%  PER THE [employer] WEBSITE PORTAL, PRIOR AUTH IS NOT REQUIRED FOR CPT CODES 16109 AND 13131.  AUTH Decision ID #: [id number]

## 2023-01-15 ENCOUNTER — Ambulatory Visit (HOSPITAL_BASED_OUTPATIENT_CLINIC_OR_DEPARTMENT_OTHER): Payer: Medicare Other | Admitting: Internal Medicine

## 2023-01-15 MED ORDER — ORAL CARE MOUTH RINSE
15.0000 mL | Freq: Once | OROMUCOSAL | Status: AC
Start: 2023-01-15 — End: 2023-01-16

## 2023-01-15 MED ORDER — CHLORHEXIDINE GLUCONATE 0.12 % MT SOLN
15.0000 mL | Freq: Once | OROMUCOSAL | Status: AC
  Administered 2023-01-16: 15 mL via OROMUCOSAL

## 2023-01-15 MED ORDER — SODIUM CHLORIDE 0.9 % IV SOLN
INTRAVENOUS | Status: DC
Start: 2023-01-15 — End: 2023-01-16

## 2023-01-15 MED ORDER — CEFAZOLIN SODIUM-DEXTROSE 2-4 GM/100ML-% IV SOLN
2.0000 g | Freq: Once | INTRAVENOUS | Status: AC
  Administered 2023-01-16: 2 g via INTRAVENOUS

## 2023-01-16 ENCOUNTER — Ambulatory Visit: Payer: Medicare Other

## 2023-01-16 ENCOUNTER — Encounter
Admission: RE | Disposition: A | Discharge status: Home / Self Care | Payer: Self-pay | Source: Ambulatory Visit | Attending: Podiatry

## 2023-01-16 ENCOUNTER — Encounter: Payer: Self-pay | Admitting: Podiatry

## 2023-01-16 ENCOUNTER — Ambulatory Visit: Payer: Medicare Other | Admitting: Certified Registered Nurse Anesthetist

## 2023-01-16 ENCOUNTER — Other Ambulatory Visit: Payer: Self-pay

## 2023-01-16 ENCOUNTER — Ambulatory Visit
Admission: RE | Admit: 2023-01-16 | Discharge: 2023-01-16 | Disposition: A | Discharge status: Home / Self Care | Payer: Medicare Other | Source: Ambulatory Visit | Attending: Podiatry | Admitting: Podiatry

## 2023-01-16 DIAGNOSIS — I10 Essential (primary) hypertension: Secondary | ICD-10-CM | POA: Insufficient documentation

## 2023-01-16 DIAGNOSIS — Z89421 Acquired absence of other right toe(s): Secondary | ICD-10-CM | POA: Diagnosis not present

## 2023-01-16 DIAGNOSIS — Z8249 Family history of ischemic heart disease and other diseases of the circulatory system: Secondary | ICD-10-CM | POA: Diagnosis not present

## 2023-01-16 DIAGNOSIS — E1151 Type 2 diabetes mellitus with diabetic peripheral angiopathy without gangrene: Secondary | ICD-10-CM | POA: Insufficient documentation

## 2023-01-16 DIAGNOSIS — E1169 Type 2 diabetes mellitus with other specified complication: Secondary | ICD-10-CM | POA: Insufficient documentation

## 2023-01-16 DIAGNOSIS — E119 Type 2 diabetes mellitus without complications: Secondary | ICD-10-CM

## 2023-01-16 DIAGNOSIS — Z794 Long term (current) use of insulin: Secondary | ICD-10-CM | POA: Diagnosis not present

## 2023-01-16 DIAGNOSIS — E1140 Type 2 diabetes mellitus with diabetic neuropathy, unspecified: Secondary | ICD-10-CM | POA: Diagnosis not present

## 2023-01-16 DIAGNOSIS — G4730 Sleep apnea, unspecified: Secondary | ICD-10-CM | POA: Insufficient documentation

## 2023-01-16 DIAGNOSIS — M869 Osteomyelitis, unspecified: Secondary | ICD-10-CM

## 2023-01-16 DIAGNOSIS — K219 Gastro-esophageal reflux disease without esophagitis: Secondary | ICD-10-CM | POA: Diagnosis not present

## 2023-01-16 DIAGNOSIS — M86171 Other acute osteomyelitis, right ankle and foot: Secondary | ICD-10-CM | POA: Diagnosis not present

## 2023-01-16 DIAGNOSIS — Z833 Family history of diabetes mellitus: Secondary | ICD-10-CM | POA: Insufficient documentation

## 2023-01-16 DIAGNOSIS — Z7984 Long term (current) use of oral hypoglycemic drugs: Secondary | ICD-10-CM | POA: Diagnosis not present

## 2023-01-16 HISTORY — PX: METATARSAL OSTEOTOMY: SHX1641

## 2023-01-16 HISTORY — PX: WOUND DEBRIDEMENT: SHX247

## 2023-01-16 LAB — GLUCOSE, CAPILLARY
Glucose-Capillary: 325 mg/dL — ABNORMAL HIGH (ref 70–99)
Glucose-Capillary: 274 mg/dL — ABNORMAL HIGH (ref 70–99)

## 2023-01-16 SURGERY — OSTEOTOMY, METATARSAL BONE
Anesthesia: Monitor Anesthesia Care | Site: Toe | Laterality: Right

## 2023-01-16 MED ORDER — TOBRAMYCIN SULFATE 1.2 G IJ SOLR
INTRAMUSCULAR | Status: AC
  Filled 2023-01-16: qty 1.2

## 2023-01-16 MED ORDER — LIDOCAINE HCL (PF) 1 % IJ SOLN
INTRAMUSCULAR | Status: AC
  Filled 2023-01-16: qty 30

## 2023-01-16 MED ORDER — MIDAZOLAM HCL 2 MG/2ML IJ SOLN
INTRAMUSCULAR | Status: DC | PRN
  Administered 2023-01-16: 2 mg via INTRAVENOUS

## 2023-01-16 MED ORDER — OXYCODONE HCL 5 MG/5ML PO SOLN: 5.0000 mg | Freq: Once | ORAL | Status: DC | PRN

## 2023-01-16 MED ORDER — PROPOFOL 10 MG/ML IV BOLUS
INTRAVENOUS | Status: AC
  Filled 2023-01-16: qty 40

## 2023-01-16 MED ORDER — INSULIN ASPART 100 UNIT/ML IJ SOLN
INTRAMUSCULAR | Status: AC
  Filled 2023-01-16: qty 1

## 2023-01-16 MED ORDER — CEFAZOLIN SODIUM-DEXTROSE 2-4 GM/100ML-% IV SOLN
INTRAVENOUS | Status: AC
  Filled 2023-01-16: qty 100

## 2023-01-16 MED ORDER — BUPIVACAINE HCL (PF) 0.5 % IJ SOLN
INTRAMUSCULAR | Status: AC
  Filled 2023-01-16: qty 30

## 2023-01-16 MED ORDER — VANCOMYCIN HCL 500 MG IV SOLR
INTRAVENOUS | Status: DC | PRN
  Administered 2023-01-16: 1000 mg via TOPICAL

## 2023-01-16 MED ORDER — 0.9 % SODIUM CHLORIDE (POUR BTL) OPTIME
TOPICAL | Status: DC | PRN
  Administered 2023-01-16: 1000 mL

## 2023-01-16 MED ORDER — INSULIN ASPART 100 UNIT/ML IJ SOLN
8.0000 [IU] | Freq: Once | INTRAMUSCULAR | Status: AC
  Administered 2023-01-16: 8 [IU] via SUBCUTANEOUS

## 2023-01-16 MED ORDER — MIDAZOLAM HCL 2 MG/2ML IJ SOLN
INTRAMUSCULAR | Status: AC
  Filled 2023-01-16: qty 2

## 2023-01-16 MED ORDER — ACETAMINOPHEN 10 MG/ML IV SOLN: 1000.0000 mg | Freq: Once | INTRAVENOUS | Status: DC | PRN

## 2023-01-16 MED ORDER — CHLORHEXIDINE GLUCONATE 0.12 % MT SOLN
OROMUCOSAL | Status: AC
  Filled 2023-01-16: qty 15

## 2023-01-16 MED ORDER — PROPOFOL 500 MG/50ML IV EMUL
INTRAVENOUS | Status: DC | PRN
  Administered 2023-01-16: 20 ug/kg/min via INTRAVENOUS

## 2023-01-16 MED ORDER — FENTANYL CITRATE (PF) 100 MCG/2ML IJ SOLN
25.0000 ug | INTRAMUSCULAR | Status: DC | PRN
Start: 2023-01-16 — End: 2023-01-16

## 2023-01-16 MED ORDER — BUPIVACAINE HCL 0.5 % IJ SOLN
INTRAMUSCULAR | Status: DC | PRN
  Administered 2023-01-16: 10 mL

## 2023-01-16 MED ORDER — VANCOMYCIN HCL 1000 MG IV SOLR
INTRAVENOUS | Status: AC
  Filled 2023-01-16: qty 20

## 2023-01-16 MED ORDER — OXYCODONE HCL 5 MG PO TABS: 5.0000 mg | ORAL_TABLET | Freq: Once | ORAL | Status: DC | PRN

## 2023-01-16 MED ORDER — FENTANYL CITRATE (PF) 100 MCG/2ML IJ SOLN
INTRAMUSCULAR | Status: AC
  Filled 2023-01-16: qty 2

## 2023-01-16 MED ORDER — TOBRAMYCIN SULFATE 80 MG/2ML IJ SOLN
40.0000 mg | Freq: Once | INTRAMUSCULAR | Status: DC
  Filled 2023-01-16: qty 1

## 2023-01-16 SURGICAL SUPPLY — 54 items
BLADE MED AGGRESSIVE (BLADE) IMPLANT
BNDG COHESIVE 4X5 TAN STRL LF (GAUZE/BANDAGES/DRESSINGS) ×3 IMPLANT
BNDG COHESIVE 6X5 TAN ST LF (GAUZE/BANDAGES/DRESSINGS) ×3 IMPLANT
BNDG ELASTIC 4INX 5YD STR LF (GAUZE/BANDAGES/DRESSINGS) ×3 IMPLANT
BNDG ESMARCH 4X12 STRL LF (GAUZE/BANDAGES/DRESSINGS) ×3 IMPLANT
BNDG GAUZE DERMACEA FLUFF 4 (GAUZE/BANDAGES/DRESSINGS) ×3 IMPLANT
BNDG STRETCH GAUZE 3IN X12FT (GAUZE/BANDAGES/DRESSINGS) ×3 IMPLANT
CUFF TOURN SGL QUICK 18X4 (TOURNIQUET CUFF) IMPLANT
CUFF TRNQT CYL 24X4X16.5-23 (TOURNIQUET CUFF) IMPLANT
DRSG EMULSION OIL 3X8 NADH (GAUZE/BANDAGES/DRESSINGS) ×3 IMPLANT
DURAPREP 26ML APPLICATOR (WOUND CARE) ×3 IMPLANT
ELECT REM PT RETURN 9FT ADLT (ELECTROSURGICAL) ×2
ELECTRODE REM PT RTRN 9FT ADLT (ELECTROSURGICAL) ×3 IMPLANT
GAUZE PACKING 0.25INX5YD STRL (GAUZE/BANDAGES/DRESSINGS) IMPLANT
GAUZE SPONGE 4X4 12PLY STRL (GAUZE/BANDAGES/DRESSINGS) ×3 IMPLANT
GAUZE STRETCH 2X75IN STRL (MISCELLANEOUS) ×3 IMPLANT
GAUZE XEROFORM 1X8 LF (GAUZE/BANDAGES/DRESSINGS) IMPLANT
GLOVE BIOGEL M STRL SZ7.5 (GLOVE) ×3 IMPLANT
GLOVE BIOGEL PI IND STRL 7.5 (GLOVE) ×3 IMPLANT
GLOVE PI ORTHO PRO STRL 7.5 (GLOVE) ×3 IMPLANT
GLOVE SKINSENSE STRL SZ7.5 (GLOVE) ×3 IMPLANT
GOWN STRL REUS W/ TWL XL LVL3 (GOWN DISPOSABLE) ×3 IMPLANT
GOWN STRL REUS W/TWL MED LVL3 (GOWN DISPOSABLE) ×3 IMPLANT
HANDPIECE VERSAJET DEBRIDEMENT (MISCELLANEOUS) IMPLANT
IV NS 1000ML BAXH (IV SOLUTION) ×3 IMPLANT
IV NS IRRIG 3000ML ARTHROMATIC (IV SOLUTION) IMPLANT
KIT STIMULAN RAPID CURE 5CC (Orthopedic Implant) IMPLANT
KIT TURNOVER KIT A (KITS) ×3 IMPLANT
LABEL OR SOLS (LABEL) ×3 IMPLANT
MANIFOLD NEPTUNE II (INSTRUMENTS) ×3 IMPLANT
NDL FILTER BLUNT 18X1 1/2 (NEEDLE) ×3 IMPLANT
NDL HYPO 25X1 1.5 SAFETY (NEEDLE) ×3 IMPLANT
NEEDLE FILTER BLUNT 18X1 1/2 (NEEDLE) ×2
NEEDLE HYPO 25X1 1.5 SAFETY (NEEDLE) ×2
NS IRRIG 500ML POUR BTL (IV SOLUTION) ×3 IMPLANT
PACK EXTREMITY ARMC (MISCELLANEOUS) ×3 IMPLANT
PACKING GAUZE IODOFORM 1INX5YD (GAUZE/BANDAGES/DRESSINGS) IMPLANT
PAD ABD DERMACEA PRESS 5X9 (GAUZE/BANDAGES/DRESSINGS) ×3 IMPLANT
PULSAVAC PLUS IRRIG FAN TIP (DISPOSABLE)
SOL PREP PVP 2OZ (MISCELLANEOUS) ×4
SOLUTION PREP PVP 2OZ (MISCELLANEOUS) ×3 IMPLANT
STAPLER SKIN PROX 35W (STAPLE) IMPLANT
STOCKINETTE IMPERVIOUS 9X36 MD (GAUZE/BANDAGES/DRESSINGS) ×3 IMPLANT
SUT ETHILON 2 0 FS 18 (SUTURE) IMPLANT
SUT ETHILON 4-0 FS2 18XMFL BLK (SUTURE) ×2
SUT PROLENE 3 0 PS 2 (SUTURE) IMPLANT
SUT VIC AB 3-0 SH 27X BRD (SUTURE) IMPLANT
SUT VIC AB 4-0 FS2 27 (SUTURE) IMPLANT
SUTURE ETHLN 4-0 FS2 18XMF BLK (SUTURE) IMPLANT
SWAB CULTURE AMIES ANAERIB BLU (MISCELLANEOUS) IMPLANT
SYR 10ML LL (SYRINGE) ×6 IMPLANT
TIP FAN IRRIG PULSAVAC PLUS (DISPOSABLE) IMPLANT
TRAP FLUID SMOKE EVACUATOR (MISCELLANEOUS) ×3 IMPLANT
WATER STERILE IRR 500ML POUR (IV SOLUTION) ×3 IMPLANT

## 2023-01-16 NOTE — Discharge Instructions (Signed)
 Foot & Ankle Surgery Patient Discharge Instructions:   Arrange to have an adult drive you home after surgery. If you had general anesthesia, it may take a day or more to fully recover. So, for at least the next 24 hours: Do not drive or use machinery or power tools; do not drink alcohol; and do not make any major decisions.   Bandages: Keep your dressings clean, dry, and intact. Do not remove or change. When bathing/showering, cover the bandage or cast with a plastic bag to keep it dry (still keep the area away from the water) and use a chair, do not stand. Don't remove your bandage until your doctor tells you to. If your bandage gets wet or dirty, check with your doctor. You can likely replace it with a clean, dry one.  Activity: Weightbearing as tolerated in a post op shoe Sit or lie down when possible. Put a pillow under your heel to raise your foot above the level of your heart. Place ice bag or pack behind knee on surgical side for 20 minutes every hour that you are awake for the first 24-48 hours. You can drive again when instructed by your doctor. Wear your surgical shoe at all times unless told otherwise by your health care provider. Use crutches or a cane as directed. Follow your doctor's instructions about putting weight on your foot.  Diet: Start with liquids and light foods (such as dry toast, bananas, and applesauce). As you feel up to it, slowly return to your normal diet. Drink at least six to eight glasses of water or other nonalcoholic fluids a day. To avoid nausea, eat before taking narcotic pain medications.  Medications: Take all medications as instructed. Take pain medications on time. Do not wait until the pain is bad before taking your medications. Avoid alcohol while on pain medications.  Call your doctor if: Continuous bleeding through dressings. If your dressings get wet. Fevers over 100.4 degrees Fahrenheit (38 degrees Celsius). Chest pains or difficulty  breathing.

## 2023-01-16 NOTE — Brief Op Note (Signed)
 01/16/2023  8:10 AM  PATIENT:  Zachary GORMAN Bars  63 y.o. male  PRE-OPERATIVE DIAGNOSIS:  OSTEOMYELITIS OF RIGHT FOOT  POST-OPERATIVE DIAGNOSIS:  * No post-op diagnosis entered *  PROCEDURE:  Procedure(s): METATARSAL RESECTION FIFTH TOE OF RIGHT FOOT (Right) WOUND EXCISION AND CLOSURE (Right)  SURGEON:  Surgeons and Role:    * Raynie Steinhaus, Marsa FALCON, DPM - Primary  PHYSICIAN ASSISTANT:   ASSISTANTS: none   ANESTHESIA:   IV sedation  EBL:  5 mL   BLOOD ADMINISTERED:none  DRAINS: none   LOCAL MEDICATIONS USED:  MARCAINE      SPECIMEN:  Source of Specimen:  bone 5th met for path and culture  DISPOSITION OF SPECIMEN:   Path and bone culture  COUNTS:  YES  TOURNIQUET:  * Missing tourniquet times found for documented tourniquets in log: [id number] *  DICTATION: .Note written in EPIC  PLAN OF CARE: Discharge to home after PACU  PATIENT DISPOSITION:  PACU - hemodynamically stable.   Delay start of Pharmacological VTE agent (>24hrs) due to surgical blood loss or risk of bleeding: no

## 2023-01-16 NOTE — H&P (Signed)
 SURGICAL HISTORY and PHYSICAL FOOT & ANKLE SURGERY    Date Time: 01/16/23 7:17 AM Physician: Malvin DPM   History of Presenting Illness: ZAN ORLICK is a 63 y.o. year old male presenting to [facility] for surgery on right foot today. The patient complains of chronic wound and cellulitis to right foot that has been present for weeks since prior partial 5th ray amputation, but that has progressively worsened during the past week-2. Concern for osteomyelitis on 5th met on recent XR. Conservative management was attempted including wound care, but was ultimately unsuccessful. The patient has elected to pursue surgical management and understands the procedure, benefits, risks/complications, and alternatives to surgery. He has remained npo since MN. Otherwise feeling well today, no other complaints. Denies fever, chills, nausea, emesis, dyspnea, and chest pain.  Past Medical History: Past Medical History:  Diagnosis Date   Anxiety    Arthritis    Bipolar 1 disorder (HCC)    Blood transfusion without reported diagnosis    Cataract    Chronic pain    Depressed bipolar disorder (HCC)    Depression    Diabetes mellitus without complication (HCC)    Diabetic neuropathy (HCC)    GERD (gastroesophageal reflux disease)    Herpes zoster 12/05/2008   Qualifier: Diagnosis of  By: Joshua MD, Debby CROME.    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  Coupled with high doses of loperamide  used for pain control.  Unintentional overdose -intractable VT leading to cardiogenic shock - ECMO   Hyperlipidemia    Hypertension    Neuromuscular disorder (HCC)    nerve damage back, neck, and shoulder   Neuropathy in diabetes (HCC)    Sleep apnea    has CPAP but cannot use it   Substance abuse (HCC)    in past     Past Surgical History: Past Surgical History:  Procedure Laterality Date   AMPUTATION TOE Right 11/22/2022    Procedure: Right 5th partial ray amputaiton;  Surgeon: Malvin Marsa FALCON, DPM;  Location: [facility] OR;  Service: Orthopedics/Podiatry;  Laterality: Right;   COLONOSCOPY     EXTRACORPOREAL CIRCULATION  11/2015   FOR Cardiogenic shock related to intractable [place] VT storm (prolonged QT from drug toxixcity)    INTRAOPERATIVE TRANSESOPHAGEAL ECHOCARDIOGRAM N/A 11/26/2016   Procedure: INTRAOPERATIVE TRANSESOPHAGEAL ECHOCARDIOGRAM;  Surgeon: Fleeta Hanford Coy, MD;  Location: [facility] OR;  Service: Open Heart Surgery;  Laterality: N/A;   IR ANGIOGRAM EXTREMITY LEFT  06/05/2020   IR PTA NON CORO-LOWER EXTREM  06/05/2020   IR RADIOLOGIST EVAL & MGMT  05/21/2020   IR US  GUIDE VASC ACCESS LEFT  06/05/2020   POLYPECTOMY     SHOULDER ARTHROSCOPY WITH ROTATOR CUFF REPAIR Right 2009   SPINE SURGERY  2010   TRANSTHORACIC ECHOCARDIOGRAM  07/2016; 12/10/2016   a. Prior to VT arrest: normal. EF 55-60%. Gr 1 DD.  mild LVH. Mildly dilated Aortic Root.;; b.  Normal LV size and function.  Mild LVH.  EF 55%.  No RWMA.  No valve abnormalities.    Allergies: Allergies  Allergen Reactions   Heparin  Other (See Comments)    HIT Ab negative on 02/20/15, but SRA POSITIVE    Oxytetracycline Rash and Other (See Comments)   Del-Mycin [Erythromycin] Other (See Comments)    All mycin drugs - unknown reaction   Sulfa  Antibiotics Other (See Comments)    Unknown reaction    Home Medications: Current  Facility-Administered Medications  Medication Dose Route Frequency Provider Last Rate Last Admin   0.9 %  sodium chloride  infusion   Intravenous Continuous Mazzoni, Andrea, MD       ceFAZolin  (ANCEF ) IVPB 2g/100 mL premix  2 g Intravenous Once Vaibhav Fogleman F, DPM       Medications Prior to Admission  Medication Sig Dispense Refill Last Dose/Taking   atorvastatin  (LIPITOR) 80 MG tablet Take 80 mg by mouth daily.   01/15/2023   blood glucose meter kit and supplies KIT Dispense based on patient and insurance preference. Use up  to four times daily as directed. 1 each 0 01/15/2023   buprenorphine -naloxone  (SUBOXONE ) 8-2 mg SUBL SL tablet Place 1 tablet under the tongue 3 (three) times daily.   01/15/2023   clindamycin  (CLEOCIN ) 300 MG capsule Take 1 capsule (300 mg total) by mouth 3 (three) times daily. 30 capsule 0 01/15/2023   ferrous sulfate  325 (65 FE) MG tablet Take 325 mg by mouth daily with breakfast.   01/15/2023   furosemide  (LASIX ) 40 MG tablet Take 40 mg by mouth.   01/15/2023   glipiZIDE (GLUCOTROL) 10 MG tablet Take 10 mg by mouth daily before breakfast.   01/15/2023   hydrochlorothiazide  (HYDRODIURIL ) 25 MG tablet Take 25 mg by mouth daily.   01/15/2023   LANTUS  SOLOSTAR 100 UNIT/ML Solostar Pen Inject 12 Units into the skin daily. (Patient taking differently: Inject 15 Units into the skin daily.) 15 mL 0 01/15/2023   losartan  (COZAAR ) 100 MG tablet Take 100 mg by mouth daily.   01/15/2023   metFORMIN  (GLUCOPHAGE ) 500 MG tablet Take 500 mg by mouth 2 (two) times daily.   01/15/2023   metoCLOPramide  (REGLAN ) 5 MG tablet Take 1 tablet (5 mg total) by mouth 4 (four) times daily. 30 tablet 0 01/15/2023   omeprazole  (PRILOSEC ) 20 MG capsule Take 20 mg by mouth daily as needed (Heartburn).   01/15/2023   QUEtiapine  Fumarate 150 MG TABS Take 150 mg by mouth at bedtime.   01/15/2023   sertraline  (ZOLOFT ) 100 MG tablet Take 100 mg by mouth daily.   01/15/2023   mupirocin  ointment (BACTROBAN ) 2 % Apply 1 Application topically daily. (Patient not taking: Reported on 01/15/2023) 22 g 0 Not Taking   OZEMPIC, 1 MG/DOSE, 4 MG/3ML SOPN Inject into the skin. (Patient not taking: Reported on 01/15/2023)   Not Taking   silver  sulfADIAZINE  (SILVADENE ) 1 % cream Apply 1 Application topically daily. (Patient not taking: Reported on 01/15/2023) 50 g 0 Not Taking     Social History:  Family History: Family History  Problem Relation Age of Onset   Diabetes Mother    Hyperlipidemia Mother    Heart disease Father    Colon cancer Neg Hx    Colon polyps Neg Hx     Esophageal cancer Neg Hx    Rectal cancer Neg Hx    Stomach cancer Neg Hx     Review of Systems: As per HPI. A complete 10 point review of systems was performed and all other systems reviewed were negative.  Physical Exam: Vitals:   01/16/23 0645  BP: 132/69  Pulse: (!) 56  Resp: 18  Temp: 98 F (36.7 C)  SpO2: 96%    Lower Extremity Physical Exam  Vasc: Dp and PT pulses 2+ Neuro: Sensation absent to both feet MSK: s/p Prior partial 5th ray amp Derm:      Labs:  - Reviewed prior to examination  Radiology:  - Reviewed prior to  examination  Assessment: MONTRELL CESSNA is 63 y.o. male with ulceration and  osteomyelitis at 5th met stump right foot.  Plan: - Will proceed with surgical management further resection of 5th met, abx beads, wound closure. - Procedure, benefits, risks, and alternatives discussed with patient and informed consent obtained. - Prophylactic antibiotics: Ancef  2 gm pre op. - Surgical site marked.  Marsa FALCON Eliette Drumwright

## 2023-01-16 NOTE — Anesthesia Procedure Notes (Signed)
 Date/Time: 01/16/2023 7:48 AM  Performed by: Belinda, Maciah Schweigert, CRNAPre-anesthesia Checklist: Patient identified, Emergency Drugs available, Suction available, Timeout performed and Patient being monitored Patient Re-evaluated:Patient Re-evaluated prior to induction Oxygen Delivery Method: Nasal cannula

## 2023-01-16 NOTE — Anesthesia Preprocedure Evaluation (Signed)
 Anesthesia Evaluation  Patient identified by MRN, date of birth, ID band Patient awake    Reviewed: Allergy & Precautions, H&P , NPO status , Patient's Chart, lab work & pertinent test results, reviewed documented beta blocker date and time   Airway Mallampati: II  TM Distance: >3 FB Neck ROM: full    Dental no notable dental hx. (+) Teeth Intact   Pulmonary sleep apnea    Pulmonary exam normal breath sounds clear to auscultation       Cardiovascular Exercise Tolerance: Poor hypertension, On Medications negative cardio ROS  Rhythm:regular Rate:Normal     Neuro/Psych  PSYCHIATRIC DISORDERS Anxiety Depression Bipolar Disorder    Neuromuscular disease    GI/Hepatic Neg liver ROS,GERD  Medicated,,  Endo/Other  negative endocrine ROSdiabetes, Poorly Controlled, Type 2, Oral Hypoglycemic Agents    Renal/GU      Musculoskeletal   Abdominal   Peds  Hematology negative hematology ROS (+)   Anesthesia Other Findings   Reproductive/Obstetrics negative OB ROS                             Anesthesia Physical Anesthesia Plan  ASA: 3 and emergent  Anesthesia Plan: MAC   Post-op Pain Management:    Induction:   PONV Risk Score and Plan: 2  Airway Management Planned:   Additional Equipment:   Intra-op Plan:   Post-operative Plan:   Informed Consent: I have reviewed the patients History and Physical, chart, labs and discussed the procedure including the risks, benefits and alternatives for the proposed anesthesia with the patient or authorized representative who has indicated his/her understanding and acceptance.       Plan Discussed with: CRNA  Anesthesia Plan Comments:        Anesthesia Quick Evaluation

## 2023-01-16 NOTE — Transfer of Care (Signed)
 Immediate Anesthesia Transfer of Care Note  Patient: [name] [name]  Procedure(s) Performed: METATARSAL RESECTION FIFTH TOE OF RIGHT FOOT (Right: Toe) WOUND EXCISION AND CLOSURE (Right)  Patient Location: PACU  Anesthesia Type:General  Level of Consciousness: drowsy  Airway & Oxygen Therapy: Patient Spontanous Breathing  Post-op Assessment: Report given to RN and Post -op Vital signs reviewed and stable  Post vital signs: Reviewed and stable  Last Vitals:  Vitals Value Taken Time  BP 106/51   Temp    Pulse 48   Resp 11   SpO2 95     Last Pain:  Vitals:   01/16/23 0645  PainSc: 3          Complications: No notable events documented.

## 2023-01-16 NOTE — Op Note (Signed)
 Full Operative Report  Date of Operation: 8:11 AM, 01/16/2023   Patient: [name] [name] - 63 y.o. male  Surgeon: Malvin Marsa FALCON, DPM   Assistant: None  Diagnosis: OSTEOMYELITIS OF RIGHT FOOT  Procedure:  1. Partial fifth metatarsal resection, right foot 2. Insertion dissolvable vancomycin  antibiotic beads, right foot 3. Complex wound closure 6 x 1 x 1.5 cm, right foot    Anesthesia: Monitor Anesthesia Care  Dario Barter, MD  Anesthesiologist: Dario Barter, MD CRNA: Belinda Salm, CRNA   Estimated Blood Loss: 5 mL  Hemostasis: 1) Anatomical dissection, mechanical compression, electrocautery 2) No tourniquet was used  Implants: * No implants in log *  Materials: Prolene 3-0 and skin staples  Injectables: 1) Pre-operatively: 10 cc of   0.5% marcaine  plain 2) Post-operatively: None   Specimens: Pathology: 5th metatarsal bone with proximal margin inked  Microbiology: 5th met bone for culture   Antibiotics: IV antibiotics given per schedule on the floor  Drains: None  Complications: Patient tolerated the procedure well without complication.   Operative findings: As below in detailed report  Indications for Procedure: KERY BATZEL presents to Malvin Marsa FAL[facility], [place] with a chief complaint of chronic nonhealing ulcer s/p prior 5th partial ray amp. Found to have residual OM at the distal 5th met stump site. The patient has failed conservative treatments of various modalities. At this time the patient has elected to proceed with surgical correction. All alternatives, risks, and complications of the procedures were thoroughly explained to the patient. Patient exhibits appropriate understanding of all discussion points and informed consent was signed and obtained in the chart with no guarantees to surgical outcome given or implied.  Description of Procedure: Patient was brought to the operating room. Patient remained on their hospital bed in the  supine position. A surgical timeout was performed and all members of the operating room, the procedure, and the surgical site were identified. anesthesia occurred as per anesthesia record. Local anesthetic as previously described was then injected about the operative field in a local infiltrative block.  The operative lower extremity as noted above was then prepped and draped in the usual sterile manner. The following procedure then began.  Attention was directed to the ulceration on the lateral aspect of the right forefoot the entire ulceration was excised down to bone level.  There is no purulence encountered.  The fifth metatarsal distal stump was then dissected with removal of any necrotic fibrotic tissues overlying.  A bone culture was harvested from the distal stump site of the fifth metatarsal.  Then using a sagittal saw transverse osteotomy was made at the proximal metadiaphysis of the fifth metatarsal beveled so as to avoid prominence laterally.  Proximal margin of this resected bone was inked and the specimen was passed off the field to be sent for pathology.  The bone at the resection margin there were to be viable and was not soft.  Next antibiotic beads mixed with vancomycin  were prepared on the back table.  These were then inserted the surgical site.  Next the surgical site was irrigated thoroughly with sterile saline via bulb syringe.  Hemostasis was then achieved with electrocautery.  The wound was measured and noted to be 6 x 1 x 1.5 cm.  Then using layered closure and extensive undermining of the tissue flaps both plantar and dorsal, and the tissue flaps were well-approximated under minimal tension.  Following this wound closure procedure and were sutured shut first with 3-0 Prolene and then stapled.  The surgical site was then dressed with xeroform 4x4 abd pad, kerlix ace wrap. The patient tolerated both the procedure and anesthesia well with vital signs stable throughout. The patient was  transferred in good condition and all vital signs stable  from the OR to recovery under the discretion of anesthesia.  Condition: Vital signs stable, neurovascular status unchanged from preoperative   Surgical plan:  Expect resection of all infected bone from the 5th metatarsal. Ulceration was exicesed and the wound closed. F/u surgical margin in the 5th met.   The patient will be WBAT in a  post op shoe to the operative limb until further instructed. The dressing is to remain clean, dry, and intact. Will continue to follow unless noted elsewhere.   Marsa Honour, DPM [facility]

## 2023-01-18 ENCOUNTER — Encounter: Payer: Self-pay | Admitting: Podiatry

## 2023-01-19 ENCOUNTER — Encounter: Payer: Self-pay | Admitting: Podiatry

## 2023-01-19 ENCOUNTER — Ambulatory Visit (INDEPENDENT_AMBULATORY_CARE_PROVIDER_SITE_OTHER): Payer: Medicare Other | Admitting: Podiatry

## 2023-01-19 DIAGNOSIS — M86171 Other acute osteomyelitis, right ankle and foot: Secondary | ICD-10-CM

## 2023-01-19 DIAGNOSIS — Z9889 Other specified postprocedural states: Secondary | ICD-10-CM

## 2023-01-19 NOTE — Progress Notes (Signed)
 Subjective:  Patient ID: [name] [name], male    DOB: 07/26/1960,  MRN: [id number]  No chief complaint on file.   DOS: 01/16/23 Procedure: Right fifth metatarsal excision with wound excision and closure.   63 y.o. male returns for POV#1. Relates doing well with minimal pain was concerned for drainage he had. He relates he changed the bandages on Saturday and this morning and was concerned for some drainage on the bandage.   Review of Systems: Negative except as noted in the HPI. Denies N/V/F/Ch.  Past Medical History:  Diagnosis Date   Anxiety    Arthritis    Bipolar 1 disorder (HCC)    Blood transfusion without reported diagnosis    Cataract    Chronic pain    Depressed bipolar disorder (HCC)    Depression    Diabetes mellitus without complication (HCC)    Diabetic neuropathy (HCC)    GERD (gastroesophageal reflux disease)    Herpes zoster 12/05/2008   Qualifier: Diagnosis of  By: Joshua MD, Debby CROME.    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  Coupled with high doses of loperamide  used for pain control.  Unintentional overdose -intractable VT leading to cardiogenic shock - ECMO   Hyperlipidemia    Hypertension    Neuromuscular disorder (HCC)    nerve damage back, neck, and shoulder   Neuropathy in diabetes (HCC)    Sleep apnea    has CPAP but cannot use it   Substance abuse (HCC)    in past     Current Outpatient Medications:    atorvastatin  (LIPITOR) 80 MG tablet, Take 80 mg by mouth daily., Disp: , Rfl:    blood glucose meter kit and supplies KIT, Dispense based on patient and insurance preference. Use up to four times daily as directed., Disp: 1 each, Rfl: 0   buprenorphine -naloxone  (SUBOXONE ) 8-2 mg SUBL SL tablet, Place 1 tablet under the tongue 3 (three) times daily., Disp: , Rfl:    clindamycin  (CLEOCIN ) 300 MG capsule, Take 1 capsule (300 mg total) by mouth 3 (three) times  daily., Disp: 30 capsule, Rfl: 0   ferrous sulfate  325 (65 FE) MG tablet, Take 325 mg by mouth daily with breakfast., Disp: , Rfl:    furosemide  (LASIX ) 40 MG tablet, Take 40 mg by mouth., Disp: , Rfl:    glipiZIDE (GLUCOTROL) 10 MG tablet, Take 10 mg by mouth daily before breakfast., Disp: , Rfl:    hydrochlorothiazide  (HYDRODIURIL ) 25 MG tablet, Take 25 mg by mouth daily., Disp: , Rfl:    LANTUS  SOLOSTAR 100 UNIT/ML Solostar Pen, Inject 12 Units into the skin daily. (Patient taking differently: Inject 15 Units into the skin daily.), Disp: 15 mL, Rfl: 0   losartan  (COZAAR ) 100 MG tablet, Take 100 mg by mouth daily., Disp: , Rfl:    metFORMIN  (GLUCOPHAGE ) 500 MG tablet, Take 500 mg by mouth 2 (two) times daily., Disp: , Rfl:    metoCLOPramide  (REGLAN ) 5 MG tablet, Take 1 tablet (5 mg total) by mouth 4 (four) times daily., Disp: 30 tablet, Rfl: 0   mupirocin  ointment (BACTROBAN ) 2 %, Apply 1 Application topically daily. (Patient not taking: Reported on 01/15/2023), Disp: 22 g, Rfl: 0   omeprazole  (PRILOSEC ) 20 MG capsule, Take 20 mg by mouth daily as needed (Heartburn)., Disp: , Rfl:    OZEMPIC, 1 MG/DOSE, 4 MG/3ML SOPN, Inject into the skin. (Patient not taking: Reported  on 01/15/2023), Disp: , Rfl:    QUEtiapine  Fumarate 150 MG TABS, Take 150 mg by mouth at bedtime., Disp: , Rfl:    sertraline  (ZOLOFT ) 100 MG tablet, Take 100 mg by mouth daily., Disp: , Rfl:    silver  sulfADIAZINE  (SILVADENE ) 1 % cream, Apply 1 Application topically daily. (Patient not taking: Reported on 01/15/2023), Disp: 50 g, Rfl: 0  Social History   Tobacco Use  Smoking Status Never  Smokeless Tobacco Never    Allergies  Allergen Reactions   Heparin  Other (See Comments)    HIT Ab negative on 02/20/15, but SRA POSITIVE    Oxytetracycline Rash and Other (See Comments)   Del-Mycin [Erythromycin] Other (See Comments)    All mycin drugs - unknown reaction   Sulfa  Antibiotics Other (See Comments)    Unknown reaction    Objective:  There were no vitals filed for this visit. There is no height or weight on file to calculate BMI. Constitutional Well developed. Well nourished.  Vascular Foot warm and well perfused. Capillary refill normal to all digits.   Neurologic Normal speech. Oriented to person, place, and time. Epicritic sensation to light touch grossly present bilaterally.  Dermatologic Skin healing well without signs of infection. Skin edges well coapted without signs of infection.  Orthopedic: Tenderness to palpation noted about the surgical site.   Radiographs: Interval resection of additional proximal fifth metatarsal  Assessment:   1. Status post foot surgery   2. Other acute osteomyelitis of right foot (HCC)    Plan:  Patient was evaluated and treated and all questions answered.  S/p foot surgery right -Progressing as expected post-operatively. -WB Status: WBAT in surgical shoe -Sutures: intact. -Medications: n/a -Foot redressed. Will keep dressing clean dry and intact.   Follow-up in one week for check.   No follow-ups on file.

## 2023-01-20 NOTE — Anesthesia Postprocedure Evaluation (Signed)
 Anesthesia Post Note  Patient: [name] [name]  Procedure(s) Performed: METATARSAL RESECTION FIFTH TOE OF RIGHT FOOT (Right: Toe) WOUND EXCISION AND CLOSURE (Right)  Patient location during evaluation: PACU Anesthesia Type: MAC Level of consciousness: awake and alert Pain management: pain level controlled Vital Signs Assessment: post-procedure vital signs reviewed and stable Respiratory status: spontaneous breathing, nonlabored ventilation, respiratory function stable and patient connected to nasal cannula oxygen Cardiovascular status: blood pressure returned to baseline and stable Postop Assessment: no apparent nausea or vomiting Anesthetic complications: no   No notable events documented.   Last Vitals:  Vitals:   01/16/23 0843 01/16/23 0856  BP:  127/64  Pulse: (!) 56 (!) 53  Resp: (!) 29 18  Temp: 36.5 C 36.5 C  SpO2: 94% 95%    Last Pain:  Vitals:   01/17/23 1350  TempSrc:   PainSc: 0-No pain                 Lynwood KANDICE Clause

## 2023-01-21 LAB — AEROBIC/ANAEROBIC CULTURE W GRAM STAIN (SURGICAL/DEEP WOUND): Gram Stain: NONE SEEN

## 2023-01-21 LAB — SURGICAL PATHOLOGY

## 2023-01-22 ENCOUNTER — Encounter (HOSPITAL_BASED_OUTPATIENT_CLINIC_OR_DEPARTMENT_OTHER): Payer: Medicare Other | Attending: Internal Medicine | Admitting: Internal Medicine

## 2023-01-22 ENCOUNTER — Encounter: Payer: Medicare Other | Admitting: Podiatry

## 2023-01-22 DIAGNOSIS — E11621 Type 2 diabetes mellitus with foot ulcer: Secondary | ICD-10-CM | POA: Insufficient documentation

## 2023-01-22 DIAGNOSIS — Z89421 Acquired absence of other right toe(s): Secondary | ICD-10-CM | POA: Insufficient documentation

## 2023-01-22 DIAGNOSIS — L97511 Non-pressure chronic ulcer of other part of right foot limited to breakdown of skin: Secondary | ICD-10-CM | POA: Insufficient documentation

## 2023-01-22 DIAGNOSIS — T8131XA Disruption of external operation (surgical) wound, not elsewhere classified, initial encounter: Secondary | ICD-10-CM | POA: Diagnosis not present

## 2023-01-22 DIAGNOSIS — L97514 Non-pressure chronic ulcer of other part of right foot with necrosis of bone: Secondary | ICD-10-CM | POA: Insufficient documentation

## 2023-01-22 DIAGNOSIS — E1142 Type 2 diabetes mellitus with diabetic polyneuropathy: Secondary | ICD-10-CM | POA: Diagnosis not present

## 2023-01-28 NOTE — Progress Notes (Signed)
 RANKIN, COOLMAN ([id number]) [id number] Page 1 of 10 Visit Report for 01/22/2023 Arrival Information Details Patient Name: Date of Service: [name], GEO [name] S. 01/22/2023 2:00 PM Medical Record Number: [id number] Patient Account Number: [id number] Date of Birth/Sex: Treating RN: 1960-08-08 (63 y.o. M) Primary Care Tobin Witucki: [name] Other Clinician: Referring Jaidy Cottam: Treating Adrielle Polakowski/Extender: Rosan Harlene MILUS I KHO KHA L, [name]: 4 Visit Information History Since Last Visit Added or deleted any medications: No Patient Arrived: Ambulatory Any new allergies or adverse reactions: No Arrival Time: 13:57 Had a fall or experienced change in No Accompanied By: self activities of daily living that may affect Transfer Assistance: None risk of falls: Patient Identification Verified: Yes Signs or symptoms of abuse/neglect since last visito No Secondary Verification Process Completed: Yes Hospitalized since last visit: No Patient Requires Transmission-Based Precautions: No Implantable device outside of the clinic excluding No Patient Has Alerts: No cellular tissue based products placed in the center since last visit: Has Footwear/Offloading in Place as Prescribed: Yes Right: Wedge Shoe Pain Present Now: No Electronic Signature(s) Signed: 01/26/2023 5:23:50 PM By: Wyn Iha Entered By: Wyn Iha on 01/22/2023 14:11:44 -------------------------------------------------------------------------------- Clinic Level of Care Assessment Details Patient Name: Date of Service: [name], GEO [name] S. 01/22/2023 2:00 PM Medical Record Number: [id number] Patient Account Number: [id number] Date of Birth/Sex: Treating RN: [birth year] (63 y.o. [name] [name] Primary Care Cythina Mickelsen: [name] Other Clinician: Referring Haani Bakula: Treating Jiaire Rosebrook/Extender: Rosan Harlene MILUS I KHO KHA L, SHRA  DDHA Weeks in Treatment: 4 Clinic Level of Care Assessment Items TOOL 4 Quantity Score X- 1 0 Use when only an EandM is performed on FOLLOW-UP visit ASSESSMENTS - Nursing Assessment / Reassessment X- 1 10 Reassessment of Co-morbidities (includes updates in patient status) X- 1 5 Reassessment of Adherence to Treatment Plan ASSESSMENTS - Wound and Skin A ssessment / Reassessment []  - 0 Simple Wound Assessment / Reassessment - one wound X- 2 5 Complex Wound Assessment / Reassessment - multiple wounds []  - 0 Dermatologic / Skin Assessment (not related to wound area) ASSESSMENTS - Focused Assessment []  - 0 Circumferential Edema Measurements - multi extremities []  - 0 Nutritional Assessment / Counseling / Intervention JARMAL, LEWELLING ([id number]) [id number] Page 2 of 10 []  - 0 Lower Extremity Assessment (monofilament, tuning fork, pulses) []  - 0 Peripheral Arterial Disease Assessment (using hand held doppler) ASSESSMENTS - Ostomy and/or Continence Assessment and Care []  - 0 Incontinence Assessment and Management []  - 0 Ostomy Care Assessment and Management (repouching, etc.) PROCESS - Coordination of Care X - Simple Patient / Family Education for ongoing care 1 15 []  - 0 Complex (extensive) Patient / Family Education for ongoing care X- 1 10 Staff obtains Chiropractor, Records, T Results / Process Orders est X- 1 10 Staff telephones HHA, Nursing Homes / Clarify orders / etc []  - 0 Routine Transfer to another Facility (non-emergent condition) []  - 0 Routine Hospital Admission (non-emergent condition) []  - 0 New Admissions / Manufacturing Engineer / Ordering NPWT Apligraf, etc. , []  - 0 Emergency Hospital Admission (emergent condition) X- 1 10 Simple Discharge Coordination []  - 0 Complex (extensive) Discharge Coordination PROCESS - Special Needs []  - 0 Pediatric / Minor Patient Management []  - 0 Isolation Patient Management []  -  0 Hearing / Language / Visual special needs []  - 0 Assessment of Community assistance (transportation, D/C planning, etc.) []  - 0 Additional assistance /  Altered mentation []  - 0 Support Surface(s) Assessment (bed, cushion, seat, etc.) INTERVENTIONS - Wound Cleansing / Measurement []  - 0 Simple Wound Cleansing - one wound []  - 0 Complex Wound Cleansing - multiple wounds X- 1 5 Wound Imaging (photographs - any number of wounds) []  - 0 Wound Tracing (instead of photographs) X- 1 5 Simple Wound Measurement - one wound []  - 0 Complex Wound Measurement - multiple wounds INTERVENTIONS - Wound Dressings []  - 0 Small Wound Dressing one or multiple wounds []  - 0 Medium Wound Dressing one or multiple wounds []  - 0 Large Wound Dressing one or multiple wounds []  - 0 Application of Medications - topical []  - 0 Application of Medications - injection INTERVENTIONS - Miscellaneous []  - 0 External ear exam []  - 0 Specimen Collection (cultures, biopsies, blood, body fluids, etc.) []  - 0 Specimen(s) / Culture(s) sent or taken to Lab for analysis []  - 0 Patient Transfer (multiple staff / Nurse, Adult / Similar devices) []  - 0 Simple Staple / Suture removal (25 or less) []  - 0 Complex Staple / Suture removal (26 or more) []  - 0 Hypo / Hyperglycemic Management (close monitor of Blood Glucose) MATIX, HENSHAW ([id number]) [id number] Page 3 of 10 []  - 0 Ankle / Brachial Index (ABI) - do not check if billed separately X- 1 5 Vital Signs Has the patient been seen at the hospital within the last three years: Yes Total Score: 85 Level Of Care: New/Established - Level 3 Electronic Signature(s) Signed: 01/22/2023 6:12:20 PM By: [name] Pollen RN Entered By: [name] [name] on 01/22/2023 15:05:26 -------------------------------------------------------------------------------- Encounter Discharge Information Details Patient Name: Date of Service: SMA [name] [name],  GEO [name] S. 01/22/2023 2:00 PM Medical Record Number: [id number] Patient Account Number: [id number] Date of Birth/Sex: Treating RN: 1960-05-06 (64 y.o. [name] [name] Primary Care Amario Longmore: [name] Other Clinician: Referring Delno Blaisdell: Treating Erick Oxendine/Extender: Rosan Harlene MILUS I KHO KHA L, [name]: 4 Encounter Discharge Information Items Discharge Condition: Stable Ambulatory Status: Ambulatory Discharge Destination: Home Transportation: Private Auto Accompanied By: self Schedule Follow-up Appointment: Yes Clinical Summary of Care: Patient Declined Electronic Signature(s) Signed: 01/22/2023 6:12:20 PM By: [name] Pollen RN Entered By: [name] [name] on 01/22/2023 17:48:47 -------------------------------------------------------------------------------- Lower Extremity Assessment Details Patient Name: Date of Service: SMA [name] [name], GEO [name] S. 01/22/2023 2:00 PM Medical Record Number: [id number] Patient Account Number: [id number] Date of Birth/Sex: Treating RN: [birth year] (63 y.o. M) Primary Care Ellyana Crigler: [name] Other Clinician: Referring Cresencia Asmus: Treating Johnella Crumm/Extender: Rosan Harlene MILUS I KHO KHA L, [name]: 4 Edema Assessment Assessed: [Left: No] [Right: No] Edema: [Left: Ye] [Right: s] Calf Left: Right: Point of Measurement: 35 cm From Medial Instep 42 cm Ankle Left: Right: Point of Measurement: 11 cm From Medial Instep 28.5 cm Vascular Assessment Left: [[id number] Page 4 of 10Right:] Extremity colors, hair growth, and conditions: Extremity Color: [phone].pdf Page 4 of 10Hyperpigmented] Hair Growth on Extremity: [[id number] Page 4 of 10No] Temperature of Extremity: [phone].pdf Page 4 of 10Warm] Capillary Refill: [phone].pdf Page 4 of 10< 3  seconds] Dependent Rubor: [phone].pdf Page 4 of 10No Yes] Toe Nail Assessment Left: Right: Thick: Yes Discolored: Yes Deformed: No Improper Length and Hygiene: Yes Electronic Signature(s) Signed: 01/26/2023 5:23:50 PM By: Wyn Iha Entered By: Wyn Iha on 01/22/2023 14:13:32 -------------------------------------------------------------------------------- Multi Wound Chart Details Patient Name: Date of Service: SMA LLWO O D, GEO [name] S. 01/22/2023 2:00 PM  Medical Record Number: [id number] Patient Account Number: [id number] Date of Birth/Sex: Treating RN: [birth year] (63 y.o. M) Primary Care Joshau Code: [name] Other Clinician: Referring Permelia Bamba: Treating Panzy Bubeck/Extender: Rosan Harlene MILUS I KHO KHA L, [name]: 4 Vital Signs Height(in): 74 Pulse(bpm): 60 Weight(lbs): 236 Blood Pressure(mmHg): 116/71 Body Mass Index(BMI): 30.3 Temperature(F): 98.5 Respiratory Rate(breaths/min): 18 [1:Photos:] [N/A:N/A] Right T Great oe Right, Distal, Lateral Amputation Site - N/A Wound Location: Toe Thermal Burn Thermal Burn N/A Wounding Event: Diabetic Wound/Ulcer of the Lower Diabetic Wound/Ulcer of the Lower N/A Primary Etiology: Extremity Extremity N/A Open Surgical Wound N/A Secondary Etiology: Cataracts, Sleep Apnea, Hypertension, Cataracts, Sleep Apnea, Hypertension, N/A Comorbid History: Type II Diabetes, Osteoarthritis, Type II Diabetes, Osteoarthritis, Neuropathy Neuropathy 10/22/2022 11/22/2022 N/A Date Acquired: 4 4 N/A Weeks of Treatment: Healed - Epithelialized Open N/A Wound Status: No No N/A Wound Recurrence: 0x0x0 16x0.5x0.2 N/A Measurements L x W x D (cm) 0 6.283 N/A A (cm) : rea 0 1.257 N/A Volume (cm) : 100.00% -25.60% N/A % Reduction in A rea: 100.00% 64.10% N/A % Reduction in Volume: Grade 1 Grade 2 N/A Classification: None Present Medium N/A Exudate A mount: N/A Sanguinous  N/A Exudate TypeORANGE, HILLIGOSS ([id number]) [id number] Page 5 of 10 N/A red N/A Exudate Color: Distinct, outline attached Distinct, outline attached N/A Wound Margin: None Present (0%) Large (67-100%) N/A Granulation Amount: N/A Red, Hyper-granulation, Friable N/A Granulation Quality: None Present (0%) None Present (0%) N/A Necrotic Amount: Fascia: No Fat Layer (Subcutaneous Tissue): Yes N/A Exposed Structures: Fat Layer (Subcutaneous Tissue): No Bone: Yes Tendon: No Fascia: No Muscle: No Tendon: No Joint: No Muscle: No Bone: No Joint: No Large (67-100%) Large (67-100%) N/A Epithelialization: Excoriation: No Scarring: Yes N/A Periwound Skin Texture: Induration: No Excoriation: No Callus: No Induration: No Crepitus: No Callus: No Rash: No Crepitus: No Scarring: No Rash: No Maceration: No Maceration: No N/A Periwound Skin Moisture: Dry/Scaly: No Dry/Scaly: No Atrophie Blanche: No Atrophie Blanche: No N/A Periwound Skin Color: Cyanosis: No Cyanosis: No Ecchymosis: No Ecchymosis: No Erythema: No Erythema: No Hemosiderin Staining: No Hemosiderin Staining: No Mottled: No Mottled: No Pallor: No Pallor: No Rubor: No Rubor: No No Abnormality No Abnormality N/A Temperature: Treatment Notes Wound #2 (Amputation Site - Toe) Wound Laterality: Right, Lateral, Distal Cleanser Peri-Wound Care Topical Primary Dressing Secondary Dressing Secured With Compression Wrap Compression Stockings Add-Ons Electronic Signature(s) Signed: 01/27/2023 12:04:19 PM By: Rosan Harlene DO Entered By: Rosan Harlene on 01/26/2023 13:00:21 -------------------------------------------------------------------------------- Multi-Disciplinary Care Plan Details Patient Name: Date of Service: SMA [name] [name], GEO [name] S. 01/22/2023 2:00 PM Medical Record Number: [id number] Patient Account Number: [id number] Date of Birth/Sex: Treating  RN: Nov 13, 1960 (62 y.o. [name] [name] Primary Care Placido Hangartner: [name] Other Clinician: Referring Chivonne Rascon: Treating Lynsie Mcwatters/Extender: Rosan Harlene MILUS I KHO KHA L, [name]: 4 Active Inactive Necrotic Tissue Nursing Diagnoses: Impaired tissue integrity related to necrotic/devitalized tissue SHAKEEL, DISNEY ([id number]) [phone] Page 6 of 10 Goals: Necrotic/devitalized tissue will be minimized in the wound bed Date Initiated: 12/24/2022 Target Resolution Date: 03/13/2023 Goal Status: Active Patient/caregiver will verbalize understanding of reason and process for debridement of necrotic tissue Date Initiated: 12/24/2022 Date Inactivated: 01/05/2023 Target Resolution Date: 01/17/2023 Goal Status: Met Interventions: Assess patient pain level pre-, during and post procedure and prior to discharge Treatment Activities: Apply topical anesthetic as ordered : 12/24/2022 Notes: Nutrition Nursing Diagnoses: Impaired glucose control: actual or potential Goals:  Patient/caregiver agrees to and verbalizes understanding of need to obtain nutritional consultation Date Initiated: 12/24/2022 Date Inactivated: 01/05/2023 Target Resolution Date: 01/16/2023 Goal Status: Met Patient/caregiver will maintain therapeutic glucose control Date Initiated: 12/24/2022 Target Resolution Date: 03/13/2023 Goal Status: Active Interventions: Assess HgA1c results as ordered upon admission and as needed Provide education on elevated blood sugars and impact on wound healing Provide education on nutrition Treatment Activities: Obtain HgA1c : 12/24/2022 Patient referred to Primary Care Physician for further nutritional evaluation : 12/24/2022 Notes: Wound/Skin Impairment Nursing Diagnoses: Knowledge deficit related to ulceration/compromised skin integrity Goals: Ulcer/skin breakdown will heal within 14 weeks Date Initiated:  12/24/2022 Target Resolution Date: 03/13/2023 Goal Status: Active Interventions: Assess patient/caregiver ability to perform ulcer/skin care regimen upon admission and as needed Assess ulceration(s) every visit Provide education on ulcer and skin care Treatment Activities: Skin care regimen initiated : 12/24/2022 Topical wound management initiated : 12/24/2022 Notes: Electronic Signature(s) Signed: 01/22/2023 6:12:20 PM By: [name] Pollen RN Entered By: [name] [name] on 01/22/2023 17:45:26 -------------------------------------------------------------------------------- Pain Assessment Details Patient Name: Date of Service: SMA [name] [name], GEO [name] S. 01/22/2023 2:00 PM ESTELL ZACHARY RAMAN ([id number]) [id number] Page 7 of 10 Medical Record Number: [id number] Patient Account Number: [id number] Date of Birth/Sex: Treating RN: 1960/04/22 (63 y.o. M) Primary Care Jakobi Thetford: [name] Other Clinician: Referring Manal Kreutzer: Treating Shaquavia Whisonant/Extender: Rosan Harlene MILUS I KHO KHA L, [name]: 4 Active Problems Location of Pain Severity and Description of Pain Patient Has Paino No Site Locations Pain Management and Medication Current Pain Management: Electronic Signature(s) Signed: 01/26/2023 5:23:50 PM By: Wyn Iha Entered By: Wyn Iha on 01/22/2023 14:12:17 -------------------------------------------------------------------------------- Patient/Caregiver Education Details Patient Name: Date of Service: SMA [name] [name] [name], GEO [name] S. 1/9/2025andnbsp2:00 PM Medical Record Number: [id number] Patient Account Number: [id number] Date of Birth/Gender: Treating RN: 10/26/1960 (63 y.o. [name] [name] Primary Care Physician: [name] Other Clinician: Referring Physician: Treating Physician/Extender: Rosan Harlene MILUS LILLETTE KHO KHA L, [name]: 4 Education Assessment Education Provided  To: Patient Education Topics Provided Wound/Skin Impairment: Methods: Explain/Verbal Responses: State content correctly Electronic Signature(s) Signed: 01/22/2023 6:12:20 PM By: [name] Pollen RN Entered By: [name] [name] on 01/22/2023 17:45:46 ESTELL ZACHARY RAMAN ([id number]) [id number] Page 8 of 10 -------------------------------------------------------------------------------- Wound Assessment Details Patient Name: Date of Service: SMA [name] [name] [name], GEO [name] S. 01/22/2023 2:00 PM Medical Record Number: [id number] Patient Account Number: [id number] Date of Birth/Sex: Treating RN: 1960/07/31 (63 y.o. M) Primary Care Demmi Sindt: [name] Other Clinician: Referring Keshanna Riso: Treating Abdikadir Fohl/Extender: Rosan Harlene MILUS I KHO KHA L, [name]: 4 Wound Status Wound Number: 1 Primary Diabetic Wound/Ulcer of the Lower Extremity Etiology: Wound Location: Right T Great oe Wound Healed - Epithelialized Wounding Event: Thermal Burn Status: Date Acquired: 10/22/2022 Comorbid Cataracts, Sleep Apnea, Hypertension, Type II Diabetes, Weeks Of Treatment: 4 History: Osteoarthritis, Neuropathy Clustered Wound: No Photos Wound Measurements Length: (cm) Width: (cm) Depth: (cm) Area: (cm) Volume: (cm) 0 % Reduction in Area: 100% 0 % Reduction in Volume: 100% 0 Epithelialization: Large (67-100%) 0 Tunneling: No 0 Undermining: No Wound Description Classification: Grade 1 Wound Margin: Distinct, outline attached Exudate Amount: None Present Foul Odor After Cleansing: No Slough/Fibrino No Wound Bed Granulation Amount: None Present (0%) Exposed Structure Necrotic Amount: None Present (0%) Fascia Exposed: No Fat Layer (Subcutaneous Tissue) Exposed: No Tendon Exposed: No Muscle Exposed: No Joint Exposed: No Bone Exposed:  No Periwound Skin Texture Texture Color No Abnormalities Noted: No No Abnormalities Noted: No Callus:  No Atrophie Blanche: No Crepitus: No Cyanosis: No Excoriation: No Ecchymosis: No Induration: No Erythema: No Rash: No Hemosiderin Staining: No Scarring: No Mottled: No Pallor: No Moisture Rubor: No No Abnormalities Noted: No Dry / Scaly: No Temperature / Pain Maceration: No Temperature: No Abnormality Electronic Signature(s) ANEUDY, CHAMPLAIN ([id number]) [id number] Page 9 of 10 Signed: 01/22/2023 6:12:20 PM By: [name] Pollen RN Entered By: [name] [name] on 01/22/2023 14:54:16 -------------------------------------------------------------------------------- Wound Assessment Details Patient Name: Date of Service: [name], GEO [name] S. 01/22/2023 2:00 PM Medical Record Number: [id number] Patient Account Number: [id number] Date of Birth/Sex: Treating RN: May 31, 1960 (63 y.o. M) Primary Care Zarah Carbon: [name] Other Clinician: Referring Caylen Kuwahara: Treating Wrigley Winborne/Extender: Rosan Harlene MILUS I KHO KHA L, [name]: 4 Wound Status Wound Number: 2 Primary Diabetic Wound/Ulcer of the Lower Extremity Etiology: Wound Location: Right, Distal, Lateral Amputation Site - Toe Secondary Open Surgical Wound Wounding Event: Thermal Burn Etiology: Date Acquired: 11/22/2022 Wound Status: Open Weeks Of Treatment: 4 Comorbid Cataracts, Sleep Apnea, Hypertension, Type II Diabetes, Clustered Wound: No History: Osteoarthritis, Neuropathy Photos Wound Measurements Length: (cm) 16 Width: (cm) 0.5 Depth: (cm) 0.2 Area: (cm) 6.283 Volume: (cm) 1.257 % Reduction in Area: -25.6% % Reduction in Volume: 64.1% Epithelialization: Large (67-100%) Tunneling: No Undermining: No Wound Description Classification: Grade 2 Wound Margin: Distinct, outline attached Exudate Amount: Medium Exudate Type: Sanguinous Exudate Color: red Foul Odor After Cleansing: No Slough/Fibrino No Wound Bed Granulation Amount: Large (67-100%)  Exposed Structure Granulation Quality: Red, Hyper-granulation, Friable Fascia Exposed: No Necrotic Amount: None Present (0%) Fat Layer (Subcutaneous Tissue) Exposed: Yes Tendon Exposed: No Muscle Exposed: No Joint Exposed: No Bone Exposed: Yes Periwound Skin Texture Texture Color No Abnormalities Noted: No No Abnormalities Noted: No Callus: No Atrophie Blanche: No Crepitus: No Cyanosis: No Excoriation: No Ecchymosis: No Induration: No Erythema: No Rash: No Hemosiderin Staining: No MCCABE, GLORIA ([id number]) [id number] Page 10 of 10 Scarring: Yes Mottled: No Pallor: No Moisture Rubor: No No Abnormalities Noted: Yes Temperature / Pain Temperature: No Abnormality Treatment Notes Wound #2 (Amputation Site - Toe) Wound Laterality: Right, Lateral, Distal Cleanser Peri-Wound Care Topical Primary Dressing Secondary Dressing Secured With Compression Wrap Compression Stockings Add-Ons Electronic Signature(s) Signed: 01/27/2023 11:23:18 AM By: [name] Pollen RN Previous Signature: 01/22/2023 6:12:20 PM Version By: [name] Pollen RN Entered By: [name] [name] on 01/23/2023 09:09:56 -------------------------------------------------------------------------------- Vitals Details Patient Name: Date of Service: SMA [name] [name], GEO [name] S. 01/22/2023 2:00 PM Medical Record Number: [id number] Patient Account Number: [id number] Date of Birth/Sex: Treating RN: 11/12/1960 (63 y.o. M) Primary Care Syrus Nakama: [name] Other Clinician: Referring Shykeem Resurreccion: Treating Vicy Medico/Extender: Rosan Harlene MILUS I KHO KHA L, [name]: 4 Vital Signs Time Taken: 14:11 Temperature (F): 98.5 Height (in): 74 Pulse (bpm): 60 Weight (lbs): 236 Respiratory Rate (breaths/min): 18 Body Mass Index (BMI): 30.3 Blood Pressure (mmHg): 116/71 Reference Range: 80 - 120 mg / dl Electronic Signature(s) Signed: 01/26/2023 5:23:50 PM By:  Wyn Iha Entered By: Wyn Iha on 01/22/2023 14:12:08
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
## 2023-01-29 ENCOUNTER — Ambulatory Visit (INDEPENDENT_AMBULATORY_CARE_PROVIDER_SITE_OTHER): Payer: Medicare Other | Admitting: Podiatry

## 2023-01-29 DIAGNOSIS — Z9889 Other specified postprocedural states: Secondary | ICD-10-CM

## 2023-01-29 MED ORDER — CIPROFLOXACIN HCL 500 MG PO TABS: 500.0000 mg | ORAL_TABLET | Freq: Two times a day (BID) | ORAL | 0 refills | Status: AC

## 2023-01-31 NOTE — Progress Notes (Signed)
  Subjective:  Patient ID: [name] [name], male    DOB: [birth year],  MRN: [id number]  Status post revision right partial fifth ray resection  DOS: 01/16/2023 Procedure: 1. Partial fifth metatarsal resection, right foot 2. Insertion dissolvable vancomycin antibiotic beads, right foot 3. Complex wound closure 6 x 1 x 1.5 cm, right foot  63 y.o. male seen for post op check.  Patient is 2 weeks out from above procedures.  He saw Dr. Ralene Cork on 01/19/2023 due to some drainage from the surgical site.  She stated everything looked good and he was redressed.  He has been taking antibiotics.  Walking in a postop shoe  Review of Systems: Negative except as noted in the HPI. Denies N/V/F/Ch.   Objective:  There were no vitals filed for this visit. There is no height or weight on file to calculate BMI. Constitutional Well developed. Well nourished.  Vascular Foot warm and well perfused. Capillary refill normal to all digits.   No calf pain with palpation  Neurologic Normal speech. Oriented to person, place, and time. Epicritic sensation absent to right foot  Dermatologic Overall the incision line is healing very well however there is a central area of some dehiscence.  There are some maceration and drainage of the skin at this area.  Likely related to antibiotic bead drainage.  Some bleeding from here.   Orthopedic: Status post partial fifth ray amputation   Radiographs: Deferred at this visit  Pathology: Reactive bone with chronic osteomyelitis, margin negative for acute osteomyelitis  Micro: Pseudomonas aeruginosa  Assessment:   1. Status post foot surgery    Plan:  Patient was evaluated and treated and all questions answered.  Postop week 2 s/p right partial fifth ray resection with antibiotic bead application and complex closure of wound -Progressing well postoperatively with small dehiscence centrally. -Due to the drainage and a small dehiscence though overall healthy appearance  of the surrounding area I recommended we proceed with suturing of the dehiscence closed.  At this time.  After sterile prep with Betadine I applied three 2-0 nylon sutures to close the small dehiscence at the central incision line.  Patient tolerated well without any issues.  Closed well without significant tension -XR: Deferred at this visit -WB Status: Minimal weightbearing in postop shoe -Sutures: Sutures and staples remain intact. -Medications/ABX: E Rx for ciprofloxacin 500 mg twice daily for 10 days -Foot redressed with Betadine dressing leave intact until next appointment. - Return in 7 to 10 days        Corinna Gab, DPM [facility] / [facility]

## 2023-02-05 ENCOUNTER — Ambulatory Visit (INDEPENDENT_AMBULATORY_CARE_PROVIDER_SITE_OTHER): Payer: Medicare Other | Admitting: Podiatry

## 2023-02-05 DIAGNOSIS — Z9889 Other specified postprocedural states: Secondary | ICD-10-CM

## 2023-02-05 DIAGNOSIS — M86171 Other acute osteomyelitis, right ankle and foot: Secondary | ICD-10-CM

## 2023-02-05 NOTE — Progress Notes (Signed)
  Subjective:  Patient ID: [name] [name], male    DOB: 12-Oct-1960,  MRN: [id number]  Status post revision right partial fifth ray resection  DOS: 01/16/2023 Procedure: 1. Partial fifth metatarsal resection, right foot 2. Insertion dissolvable vancomycin antibiotic beads, right foot 3. Complex wound closure 6 x 1 x 1.5 cm, right foot  63 y.o. male seen for post op check.  Patient is 3 weeks out from above procedures.   At last appointment I placed a few sutures and reclosed a portion of the incision that had opened up.  He has been taking ciprofloxacin due to cultures with Pseudomonas.  He has another week left.  Has left the dressing clean dry and intact since last appointment as instructed  Review of Systems: Negative except as noted in the HPI. Denies N/V/F/Ch.   Objective:  There were no vitals filed for this visit. There is no height or weight on file to calculate BMI. Constitutional Well developed. Well nourished.  Vascular Foot warm and well perfused. Capillary refill normal to all digits.   No calf pain with palpation  Neurologic Normal speech. Oriented to person, place, and time. Epicritic sensation absent to right foot  Dermatologic Amputation site appears improvedf with incision line without dehiscence at this time.  There is decreased maceration from prior no open draining wound.  There is some mild erythema distally in the forefoot however not severe.    Orthopedic: Status post partial fifth ray amputation   Radiographs: Deferred at this visit  Pathology: Reactive bone with chronic osteomyelitis, margin negative for acute osteomyelitis  Micro: Pseudomonas aeruginosa  Assessment:   1. Status post foot surgery   2. Other acute osteomyelitis of right foot (HCC)     Plan:  Patient was evaluated and treated and all questions answered.  Postop week 3 s/p right partial fifth ray resection with antibiotic bead application and complex closure of wound -Progressing  well today improved from last visit with sutures and staples intact across prior area of dehiscence -Will continue with weekly monitoring.  Continue on antibiotics until course completed -XR: Deferred at this visit -WB Status: Minimal weightbearing in postop shoe -Sutures: Sutures and staples remain intact until next at least -Medications/ABX: Finish course of ciprofloxacin and then monitor off antibiotics -Foot redressed with Betadine dressing with Xeroform overlying the incision leave intact until next appointment. - Return in 7 days        Corinna Gab, DPM [facility] / [facility]

## 2023-02-12 ENCOUNTER — Ambulatory Visit (INDEPENDENT_AMBULATORY_CARE_PROVIDER_SITE_OTHER): Payer: Medicare Other | Admitting: Podiatry

## 2023-02-12 DIAGNOSIS — E1142 Type 2 diabetes mellitus with diabetic polyneuropathy: Secondary | ICD-10-CM

## 2023-02-12 DIAGNOSIS — Z9889 Other specified postprocedural states: Secondary | ICD-10-CM

## 2023-02-12 DIAGNOSIS — M86171 Other acute osteomyelitis, right ankle and foot: Secondary | ICD-10-CM

## 2023-02-12 NOTE — Progress Notes (Signed)
  Subjective:  Patient ID: [name] [name], male    DOB: May 16, 1960,  MRN: [id number]  Status post revision right partial fifth ray resection  DOS: 01/16/2023 Procedure: 1. Partial fifth metatarsal resection, right foot 2. Insertion dissolvable vancomycin antibiotic beads, right foot 3. Complex wound closure 6 x 1 x 1.5 cm, right foot  63 y.o. male seen for post op check.  Patient is 4 weeks out from above procedures.   Patient has been continue to improve with weekly dressing changes.  Denies nausea vomiting fever chills or drainage.  Review of Systems: Negative except as noted in the HPI. Denies N/V/F/Ch.   Objective:  There were no vitals filed for this visit. There is no height or weight on file to calculate BMI. Constitutional Well developed. Well nourished.  Vascular Foot warm and well perfused. Capillary refill normal to all digits.   No calf pain with palpation  Neurologic Normal speech. Oriented to person, place, and time. Epicritic sensation absent to right foot  Dermatologic Amputation site appears improvedf with incision line without dehiscence at this time.  Xerotic skin noted along the incision line.   Orthopedic: Status post partial fifth ray amputation   Radiographs: Deferred at this visit  Pathology: Reactive bone with chronic osteomyelitis, margin negative for acute osteomyelitis  Micro: Pseudomonas aeruginosa  Assessment:   1. Status post foot surgery   2. Other acute osteomyelitis of right foot (HCC)   3. DM type 2 with diabetic peripheral neuropathy (HCC)      Plan:  Patient was evaluated and treated and all questions answered.  Postop week 3 s/p right partial fifth ray resection with antibiotic bead application and complex closure of wound -Progressing well today improved from last visit with sutures and staples intact and increased healing across the incision line.  No evidence of erythema drainage or dehiscence today -XR: Deferred at this  visit -WB Status: Minimal weightbearing in postop shoe -Sutures: Removed in total at this visit -Medications/ABX: Monitor off antibiotics -Foot redressed with Maxorb silver dressing and dry gauze.  Steri-Strips were applied.  Begin 3 times weekly dressing changes with dry gauze dressing. -Discussed with patient he can now get the right foot wet and wash it with warm soapy water then dry thoroughly and reapply dressing.  Change dressing 3 times weekly as above - Return in 3 weeks        Corinna Gab, DPM [facility] / [facility]

## 2023-03-05 ENCOUNTER — Ambulatory Visit (INDEPENDENT_AMBULATORY_CARE_PROVIDER_SITE_OTHER): Payer: Medicare Other | Admitting: Podiatry

## 2023-03-05 ENCOUNTER — Encounter: Payer: Self-pay | Admitting: Podiatry

## 2023-03-05 VITALS — Ht 74.0 in | Wt 241.0 lb

## 2023-03-05 DIAGNOSIS — M86171 Other acute osteomyelitis, right ankle and foot: Secondary | ICD-10-CM

## 2023-03-05 DIAGNOSIS — Z9889 Other specified postprocedural states: Secondary | ICD-10-CM

## 2023-03-05 MED ORDER — GENTAMICIN SULFATE 0.1 % EX OINT: 1.0000 | TOPICAL_OINTMENT | Freq: Three times a day (TID) | CUTANEOUS | 0 refills | Status: AC

## 2023-03-05 MED ORDER — CIPROFLOXACIN HCL 500 MG PO TABS: 500.0000 mg | ORAL_TABLET | Freq: Two times a day (BID) | ORAL | 0 refills | Status: AC

## 2023-03-05 NOTE — Progress Notes (Signed)
  Subjective:  Patient ID: [name] [name], male    DOB: 25-Jun-1960,  MRN: [id number]  Status post revision right partial fifth ray resection  DOS: 01/16/2023 Procedure: 1. Partial fifth metatarsal resection, right foot 2. Insertion dissolvable vancomycin antibiotic beads, right foot 3. Complex wound closure 6 x 1 x 1.5 cm, right foot  63 y.o. male seen for post op check.  Patient is 7 weeks out from above procedures.   He reports he was nearly healed but then noticed a small wound open up distally near the base of the fourth toe. Had some drainage. Also noticed a retained staple near there.   Review of Systems: Negative except as noted in the HPI. Denies N/V/F/Ch.   Objective:  There were no vitals filed for this visit. Body mass index is 30.94 kg/m. Constitutional Well developed. Well nourished.  Vascular Foot warm and well perfused. Capillary refill normal to all digits.   No calf pain with palpation  Neurologic Normal speech. Oriented to person, place, and time. Epicritic sensation absent to right foot  Dermatologic Amputation site appears improvedf with incision line without dehiscence at this time.  Distally there is a 0.5 cm ulceration that opened with bleeding and fibrotic macerated tissue.     Orthopedic: Status post partial fifth ray amputation   Radiographs: Deferred at this visit  Pathology: Reactive bone with chronic osteomyelitis, margin negative for acute osteomyelitis  Micro: Pseudomonas aeruginosa  Assessment:   1. Status post foot surgery   2. Other acute osteomyelitis of right foot (HCC)       Plan:  Patient was evaluated and treated and all questions answered.  Postop week 6 s/p right partial fifth ray resection with antibiotic bead application and complex closure of wound -Concern for small ulceration noted distally with superficial wound infection.  - Debrided the wound and used silver nitrate to stop bleeding.  Will proceed with local wound  care for this area. - Daily genatmicin ointment and large band aid applied over the area.  -XR: Deferred at this visit -WB Status: WB in post op shoe -Sutures: Removed retained staple today -Medications/ABX:  Due to new wound will send Rx for abx - ciprofloxacin 500 mg BID x 7 days -Foot redressed with abx ointment, adhesive bandage. -Hold on getting foot wet in shower now that new ulcera appeared - Return in 2 weeks        Corinna Gab, DPM [facility] / [facility]

## 2023-03-19 ENCOUNTER — Encounter: Payer: Medicare Other | Admitting: Podiatry

## 2024-01-27 ENCOUNTER — Ambulatory Visit: Payer: Self-pay | Admitting: Family Medicine

## 2024-01-27 ENCOUNTER — Ambulatory Visit (INDEPENDENT_AMBULATORY_CARE_PROVIDER_SITE_OTHER): Payer: Medicare (Managed Care) | Admitting: Family Medicine

## 2024-01-27 ENCOUNTER — Encounter: Payer: Self-pay | Admitting: Family Medicine

## 2024-01-27 VITALS — BP 136/81 | HR 61 | Ht 74.0 in | Wt 227.2 lb

## 2024-01-27 DIAGNOSIS — E1142 Type 2 diabetes mellitus with diabetic polyneuropathy: Secondary | ICD-10-CM | POA: Diagnosis not present

## 2024-01-27 DIAGNOSIS — F319 Bipolar disorder, unspecified: Secondary | ICD-10-CM

## 2024-01-27 DIAGNOSIS — Z1211 Encounter for screening for malignant neoplasm of colon: Secondary | ICD-10-CM

## 2024-01-27 DIAGNOSIS — K5900 Constipation, unspecified: Secondary | ICD-10-CM | POA: Diagnosis not present

## 2024-01-27 DIAGNOSIS — E782 Mixed hyperlipidemia: Secondary | ICD-10-CM

## 2024-01-27 DIAGNOSIS — Z7984 Long term (current) use of oral hypoglycemic drugs: Secondary | ICD-10-CM

## 2024-01-27 DIAGNOSIS — I10 Essential (primary) hypertension: Secondary | ICD-10-CM | POA: Diagnosis not present

## 2024-01-27 DIAGNOSIS — Z7689 Persons encountering health services in other specified circumstances: Secondary | ICD-10-CM

## 2024-01-27 LAB — POCT GLYCOSYLATED HEMOGLOBIN (HGB A1C): HbA1c, POC (controlled diabetic range): 7.8 % — AB (ref 0.0–7.0)

## 2024-01-27 NOTE — Progress Notes (Signed)
 "  New Patient Office Visit  Subjective    Patient ID: [name] [name], male    DOB: [birth year]  Age: 64 y.o. MRN: [id number]  CC:  Chief Complaint  Patient presents with   Establish Care    HPI [name] [name] presents to establish care and for review of chronic med issues including diabetes and hypertension and depression. Patient reports med compliance. He also complains of constipation but is on a narcotic pain reliever.    Outpatient Encounter Medications as of 01/27/2024  Medication Sig   atorvastatin  (LIPITOR) 80 MG tablet Take 80 mg by mouth daily. (Patient taking differently: Take 40 mg by mouth daily.)   buprenorphine  (SUBUTEX ) 2 MG SUBL SL tablet Place 2 mg under the tongue 3 (three) times daily.   hydrochlorothiazide  (HYDRODIURIL ) 25 MG tablet Take 25 mg by mouth daily.   losartan  (COZAAR ) 100 MG tablet Take 100 mg by mouth daily.   metFORMIN  (GLUCOPHAGE ) 500 MG tablet Take 500 mg by mouth 2 (two) times daily. (Patient taking differently: Take 1,000 mg by mouth 2 (two) times daily.)   MOUNJARO 2.5 MG/0.5ML Pen Inject 2.5 mg into the skin.   omeprazole  (PRILOSEC ) 20 MG capsule Take 20 mg by mouth daily as needed (Heartburn).   QUEtiapine  Fumarate 150 MG TABS Take 150 mg by mouth at bedtime. (Patient taking differently: Take 300 mg by mouth at bedtime.)   blood glucose meter kit and supplies KIT Dispense based on patient and insurance preference. Use up to four times daily as directed.   ferrous sulfate  325 (65 FE) MG tablet Take 325 mg by mouth daily with breakfast. (Patient not taking: Reported on 01/27/2024)   furosemide  (LASIX ) 40 MG tablet Take 40 mg by mouth.   gentamicin  ointment (GARAMYCIN ) 0.1 % Apply 1 Application topically 3 (three) times daily.   glipiZIDE (GLUCOTROL) 10 MG tablet Take 10 mg by mouth daily before breakfast.   LANTUS  SOLOSTAR 100 UNIT/ML Solostar Pen Inject 12 Units into the skin daily. (Patient taking differently: Inject 15 Units into the skin  daily.)   metoCLOPramide  (REGLAN ) 5 MG tablet Take 1 tablet (5 mg total) by mouth 4 (four) times daily. (Patient not taking: Reported on 01/27/2024)   sertraline  (ZOLOFT ) 100 MG tablet Take 100 mg by mouth daily. (Patient not taking: Reported on 01/27/2024)   No facility-administered encounter medications on file as of 01/27/2024.    Past Medical History:  Diagnosis Date   Anxiety    Arthritis    Bipolar 1 disorder (HCC)    Blood transfusion without reported diagnosis    Cataract    Chronic pain    Depressed bipolar disorder (HCC)    Depression    Diabetes mellitus without complication (HCC)    Diabetic neuropathy (HCC)    GERD (gastroesophageal reflux disease)    Herpes zoster 12/05/2008   Qualifier: Diagnosis of  By: Joshua MD, Debby CROME.    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  Coupled with high doses of loperamide  used for pain control.  Unintentional overdose -intractable VT leading to cardiogenic shock - ECMO   Hyperlipidemia    Hypertension    Neuromuscular disorder (HCC)    nerve damage back, neck, and shoulder   Neuropathy in diabetes (HCC)    Sleep apnea    has CPAP but cannot use it   Substance abuse (HCC)    in past  Past Surgical History:  Procedure Laterality Date   AMPUTATION TOE Right 11/22/2022   Procedure: Right 5th partial ray amputaiton;  Surgeon: Malvin Marsa FALCON, DPM;  Location: [facility] OR;  Service: Orthopedics/Podiatry;  Laterality: Right;   COLONOSCOPY     EXTRACORPOREAL CIRCULATION  11/2015   FOR Cardiogenic shock related to intractable [place] VT storm (prolonged QT from drug toxixcity)    INTRAOPERATIVE TRANSESOPHAGEAL ECHOCARDIOGRAM N/A 11/26/2016   Procedure: INTRAOPERATIVE TRANSESOPHAGEAL ECHOCARDIOGRAM;  Surgeon: Fleeta Hanford Coy, MD;  Location: [facility] OR;  Service: Open Heart Surgery;  Laterality: N/A;   IR ANGIOGRAM EXTREMITY LEFT  06/05/2020   IR PTA  NON CORO-LOWER EXTREM  06/05/2020   IR RADIOLOGIST EVAL & MGMT  05/21/2020   IR US  GUIDE VASC ACCESS LEFT  06/05/2020   METATARSAL OSTEOTOMY Right 01/16/2023   Procedure: METATARSAL RESECTION FIFTH TOE OF RIGHT FOOT;  Surgeon: Malvin Marsa FALCON, DPM;  Location: [facility] ORS;  Service: Orthopedics/Podiatry;  Laterality: Right;   POLYPECTOMY     SHOULDER ARTHROSCOPY WITH ROTATOR CUFF REPAIR Right 2009   SPINE SURGERY  2010   TRANSTHORACIC ECHOCARDIOGRAM  07/2016; 12/10/2016   a. Prior to VT arrest: normal. EF 55-60%. Gr 1 DD.  mild LVH. Mildly dilated Aortic Root.;; b.  Normal LV size and function.  Mild LVH.  EF 55%.  No RWMA.  No valve abnormalities.   WOUND DEBRIDEMENT Right 01/16/2023   Procedure: WOUND EXCISION AND CLOSURE;  Surgeon: Malvin Marsa FALCON, DPM;  Location: [facility] ORS;  Service: Orthopedics/Podiatry;  Laterality: Right;    Family History  Problem Relation Age of Onset   Diabetes Mother    Hyperlipidemia Mother    Heart disease Father    Colon cancer Neg Hx    Colon polyps Neg Hx    Esophageal cancer Neg Hx    Rectal cancer Neg Hx    Stomach cancer Neg Hx     Social History   Socioeconomic History   Marital status: Divorced    Spouse name: Not on file   Number of children: 3   Years of education: Not on file   Highest education level: Not on file  Occupational History   Occupation: Disabled  Tobacco Use   Smoking status: Never   Smokeless tobacco: Never  Vaping Use   Vaping status: Never Used  Substance and Sexual Activity   Alcohol use: Yes    Comment: 07/09/2021   Drug use: No   Sexual activity: Not Currently  Other Topics Concern   Not on file  Social History Narrative   ** Merged History Encounter **       Social Drivers of Health   Tobacco Use: Low Risk (01/27/2024)   Patient History    Smoking Tobacco Use: Never    Smokeless Tobacco Use: Never    Passive Exposure: Not on file  Financial Resource Strain: Low Risk (01/27/2024)   Overall  Financial Resource Strain (CARDIA)    Difficulty of Paying Living Expenses: Not hard at all  Food Insecurity: Low Risk (11/30/2023)   Received from [facility]   Epic    Within the past 12 months, you worried that your food would run out before you got money to buy more: Never true    Within the past 12 months, the food you bought just didn't last and you didn't have money to get more. : Never true  Transportation Needs: No Transportation Needs (11/30/2023)   Received from [facility]    In  the past 12 months, has lack of reliable transportation kept you from medical appointments, meetings, work or from getting things needed for daily living? : No  Physical Activity: Sufficiently Active (01/27/2024)   Exercise Vital Sign    Days of Exercise per Week: 7 days    Minutes of Exercise per Session: 60 min  Stress: No Stress Concern Present (01/27/2024)   [employer] of Occupational Health - Occupational Stress Questionnaire    Feeling of Stress: Not at all  Social Connections: Moderately Isolated (01/27/2024)   Social Connection and Isolation Panel    Frequency of Communication with Friends and Family: More than three times a week    Frequency of Social Gatherings with Friends and Family: More than three times a week    Attends Religious Services: More than 4 times per year    Active Member of [employer] or Organizations: No    Attends Banker Meetings: Never    Marital Status: Divorced  Catering Manager Violence: Patient Declined (11/21/2022)   Humiliation, Afraid, Rape, and Kick questionnaire    Fear of Current or Ex-Partner: Patient declined    Emotionally Abused: Patient declined    Physically Abused: Patient declined    Sexually Abused: Patient declined  Depression (PHQ2-9): High Risk (01/25/2021)   Depression (PHQ2-9)    PHQ-2 Score: 11  Alcohol Screen: Low Risk (01/27/2024)   Alcohol Screen    Last Alcohol Screening Score (AUDIT): 0  Housing: Low  Risk (11/30/2023)   Received from [facility]   Epic    What is your living situation today?: I have a steady place to live    Think about the place you live. Do you have problems with any of the following? Choose all that apply:: None/None on this list  Utilities: Low Risk (11/30/2023)   Received from [facility]   Utilities    In the past 12 months has the electric, gas, oil, or water  company threatened to shut off services in your home? : No  Health Literacy: Adequate Health Literacy (01/27/2024)   B1300 Health Literacy    Frequency of need for help with medical instructions: Never    Review of Systems  All other systems reviewed and are negative.       Objective   BP 136/81   Pulse 61   Ht 6' 2 (1.88 m)   Wt 227 lb 3.2 oz (103.1 kg)   SpO2 95%   BMI 29.17 kg/m   Physical Exam Vitals and nursing note reviewed.  Constitutional:      General: He is not in acute distress. Cardiovascular:     Rate and Rhythm: Normal rate and regular rhythm.  Pulmonary:     Effort: Pulmonary effort is normal.     Breath sounds: Normal breath sounds.  Abdominal:     Palpations: Abdomen is soft.     Tenderness: There is no abdominal tenderness.  Neurological:     General: No focal deficit present.     Mental Status: He is alert and oriented to person, place, and time.         Assessment & Plan:   1. Type 2 diabetes mellitus with diabetic polyneuropathy, without long-term current use of insulin  (HCC) (Primary) A1c is improved from previous documented and nearing goal. Continue  - POCT glycosylated hemoglobin (Hb A1C)  2. Essential hypertension Appears stable. Continue   3. Mixed hyperlipidemia Continue   4. Constipation, unspecified constipation type Miralax  and adequate hydration recommended  5. Bipolar  affective disorder, remission status unspecified (HCC) Appears stable. continue  6. Screening for colon cancer  - Ambulatory referral to Gastroenterology  7.  Encounter to establish care     No follow-ups on file.   Tanda Raguel SQUIBB, MD  "

## 2024-02-01 ENCOUNTER — Encounter: Payer: Self-pay | Admitting: Family Medicine

## 2024-02-15 ENCOUNTER — Ambulatory Visit: Payer: Medicare (Managed Care)

## 2024-02-15 VITALS — Ht 74.0 in | Wt 224.0 lb

## 2024-02-15 DIAGNOSIS — Z1211 Encounter for screening for malignant neoplasm of colon: Secondary | ICD-10-CM

## 2024-02-15 NOTE — Progress Notes (Signed)
 No egg or soy allergy known to patient  No issues known to pt with past sedation with any surgeries or procedures Patient denies ever being told they had issues or difficulty with intubation  No FH of Malignant Hyperthermia Pt is not on diet pills Pt is not on  home 02  Pt is not on blood thinners  Pt denies issues with constipation-YES  No A fib or A flutter Have any cardiac testing pending--NO Pt can ambulate-INDEPENDENTLY Pt denies use of chewing tobacco Discussed diabetic I weight loss medication holds Discussed NSAID holds Checked BMI Pt instructed to use Singlecare.com or GoodRx for a price reduction on prep  Patient's chart reviewed by Norleen Schillings CNRA prior to previsit and patient appropriate for the [facility].  Pre visit completed and red dot placed by patient's name on their procedure day (on provider's schedule).

## 2024-02-18 ENCOUNTER — Encounter: Payer: Self-pay | Admitting: Pediatrics

## 2024-03-03 ENCOUNTER — Encounter: Payer: Medicare (Managed Care) | Admitting: Pediatrics

## 2024-05-26 ENCOUNTER — Ambulatory Visit: Payer: Self-pay | Admitting: Family Medicine
